# Patient Record
Sex: Female | Born: 1935 | Race: Black or African American | Hispanic: No | Smoking: Former smoker
Health system: Southern US, Community
[De-identification: ages and names within clinical notes are randomized; demographics above are authoritative.]

## PROBLEM LIST (undated history)

## (undated) DIAGNOSIS — F32A Depression, unspecified: Secondary | ICD-10-CM

## (undated) DIAGNOSIS — R040 Epistaxis: Secondary | ICD-10-CM

## (undated) DIAGNOSIS — G4730 Sleep apnea, unspecified: Secondary | ICD-10-CM

## (undated) DIAGNOSIS — I509 Heart failure, unspecified: Secondary | ICD-10-CM

## (undated) DIAGNOSIS — F419 Anxiety disorder, unspecified: Secondary | ICD-10-CM

## (undated) DIAGNOSIS — N63 Unspecified lump in unspecified breast: Secondary | ICD-10-CM

## (undated) DIAGNOSIS — G4733 Obstructive sleep apnea (adult) (pediatric): Secondary | ICD-10-CM

## (undated) DIAGNOSIS — I10 Essential (primary) hypertension: Secondary | ICD-10-CM

## (undated) DIAGNOSIS — E669 Obesity, unspecified: Secondary | ICD-10-CM

## (undated) DIAGNOSIS — M1710 Unilateral primary osteoarthritis, unspecified knee: Secondary | ICD-10-CM

## (undated) DIAGNOSIS — F329 Major depressive disorder, single episode, unspecified: Secondary | ICD-10-CM

## (undated) DIAGNOSIS — H269 Unspecified cataract: Secondary | ICD-10-CM

## (undated) DIAGNOSIS — R7309 Other abnormal glucose: Secondary | ICD-10-CM

## (undated) DIAGNOSIS — Z87891 Personal history of nicotine dependence: Secondary | ICD-10-CM

## (undated) DIAGNOSIS — E785 Hyperlipidemia, unspecified: Secondary | ICD-10-CM

## (undated) DIAGNOSIS — J329 Chronic sinusitis, unspecified: Secondary | ICD-10-CM

## (undated) DIAGNOSIS — I639 Cerebral infarction, unspecified: Secondary | ICD-10-CM

## (undated) DIAGNOSIS — M4800 Spinal stenosis, site unspecified: Secondary | ICD-10-CM

## (undated) DIAGNOSIS — N6019 Diffuse cystic mastopathy of unspecified breast: Secondary | ICD-10-CM

## (undated) DIAGNOSIS — I517 Cardiomegaly: Secondary | ICD-10-CM

## (undated) DIAGNOSIS — D249 Benign neoplasm of unspecified breast: Secondary | ICD-10-CM

## (undated) DIAGNOSIS — K449 Diaphragmatic hernia without obstruction or gangrene: Secondary | ICD-10-CM

## (undated) DIAGNOSIS — K222 Esophageal obstruction: Secondary | ICD-10-CM

## (undated) DIAGNOSIS — K5730 Diverticulosis of large intestine without perforation or abscess without bleeding: Secondary | ICD-10-CM

## (undated) DIAGNOSIS — R609 Edema, unspecified: Secondary | ICD-10-CM

## (undated) DIAGNOSIS — M179 Osteoarthritis of knee, unspecified: Secondary | ICD-10-CM

## (undated) DIAGNOSIS — F5089 Other specified eating disorder: Secondary | ICD-10-CM

## (undated) DIAGNOSIS — M5430 Sciatica, unspecified side: Secondary | ICD-10-CM

## (undated) DIAGNOSIS — K922 Gastrointestinal hemorrhage, unspecified: Secondary | ICD-10-CM

## (undated) DIAGNOSIS — I499 Cardiac arrhythmia, unspecified: Secondary | ICD-10-CM

## (undated) DIAGNOSIS — I5030 Unspecified diastolic (congestive) heart failure: Secondary | ICD-10-CM

## (undated) DIAGNOSIS — F509 Eating disorder, unspecified: Secondary | ICD-10-CM

## (undated) DIAGNOSIS — N390 Urinary tract infection, site not specified: Secondary | ICD-10-CM

## (undated) HISTORY — DX: Unilateral primary osteoarthritis, unspecified knee: M17.10

## (undated) HISTORY — DX: Sleep apnea, unspecified: G47.30

## (undated) HISTORY — PX: BREAST BIOPSY: SHX20

## (undated) HISTORY — DX: Chronic sinusitis, unspecified: J32.9

## (undated) HISTORY — DX: Unspecified cataract: H26.9

## (undated) HISTORY — DX: Depression, unspecified: F32.A

## (undated) HISTORY — DX: Urinary tract infection, site not specified: N39.0

## (undated) HISTORY — PX: US TRANSVAGINAL PELVIC MODIFIED: HXRAD721

## (undated) HISTORY — DX: Major depressive disorder, single episode, unspecified: F32.9

## (undated) HISTORY — DX: Esophageal obstruction: K22.2

## (undated) HISTORY — DX: Cardiac arrhythmia, unspecified: I49.9

## (undated) HISTORY — DX: Osteoarthritis of knee, unspecified: M17.9

## (undated) HISTORY — DX: Unspecified lump in unspecified breast: N63.0

## (undated) HISTORY — PX: JOINT REPLACEMENT: SHX530

## (undated) HISTORY — DX: Cardiomegaly: I51.7

## (undated) HISTORY — DX: Hyperlipidemia, unspecified: E78.5

## (undated) HISTORY — PX: APPENDECTOMY: SHX54

## (undated) HISTORY — PX: TOTAL ABDOMINAL HYSTERECTOMY: SHX209

## (undated) HISTORY — DX: Spinal stenosis, site unspecified: M48.00

## (undated) HISTORY — DX: Other specified eating disorder: F50.89

## (undated) HISTORY — DX: Other abnormal glucose: R73.09

## (undated) HISTORY — DX: Diverticulosis of large intestine without perforation or abscess without bleeding: K57.30

## (undated) HISTORY — DX: Obstructive sleep apnea (adult) (pediatric): G47.33

## (undated) HISTORY — DX: Epistaxis: R04.0

## (undated) HISTORY — DX: Edema, unspecified: R60.9

## (undated) HISTORY — DX: Diffuse cystic mastopathy of unspecified breast: N60.19

## (undated) HISTORY — DX: Anxiety disorder, unspecified: F41.9

## (undated) HISTORY — DX: Personal history of nicotine dependence: Z87.891

## (undated) HISTORY — PX: BREAST LUMPECTOMY: SHX2

## (undated) HISTORY — DX: Sciatica, unspecified side: M54.30

## (undated) HISTORY — DX: Essential (primary) hypertension: I10

## (undated) HISTORY — DX: Eating disorder, unspecified: F50.9

## (undated) HISTORY — DX: Obesity, unspecified: E66.9

## (undated) HISTORY — DX: Unspecified diastolic (congestive) heart failure: I50.30

## (undated) HISTORY — DX: Diaphragmatic hernia without obstruction or gangrene: K44.9

## (undated) HISTORY — DX: Benign neoplasm of unspecified breast: D24.9

## (undated) HISTORY — PX: TONSILLECTOMY: SUR1361

---

## 1998-09-09 ENCOUNTER — Ambulatory Visit (HOSPITAL_COMMUNITY): Admission: RE | Admit: 1998-09-09 | Discharge: 1998-09-09 | Payer: Self-pay | Admitting: Cardiology

## 1998-09-09 ENCOUNTER — Encounter: Payer: Self-pay | Admitting: Cardiology

## 1998-09-24 ENCOUNTER — Ambulatory Visit (HOSPITAL_COMMUNITY): Admission: RE | Admit: 1998-09-24 | Discharge: 1998-09-24 | Payer: Self-pay | Admitting: Cardiology

## 1998-11-11 ENCOUNTER — Other Ambulatory Visit: Admission: RE | Admit: 1998-11-11 | Discharge: 1998-11-11 | Payer: Self-pay | Admitting: Obstetrics

## 1999-02-22 HISTORY — PX: CARDIAC CATHETERIZATION: SHX172

## 1999-06-18 ENCOUNTER — Encounter: Payer: Self-pay | Admitting: Gynecology

## 1999-06-18 ENCOUNTER — Encounter: Admission: RE | Admit: 1999-06-18 | Discharge: 1999-06-18 | Payer: Self-pay | Admitting: Gynecology

## 1999-08-18 ENCOUNTER — Encounter: Payer: Self-pay | Admitting: Family Medicine

## 1999-08-18 ENCOUNTER — Encounter: Admission: RE | Admit: 1999-08-18 | Discharge: 1999-08-18 | Payer: Self-pay | Admitting: Family Medicine

## 2000-03-16 ENCOUNTER — Ambulatory Visit (HOSPITAL_COMMUNITY): Admission: RE | Admit: 2000-03-16 | Discharge: 2000-03-16 | Payer: Self-pay | Admitting: Gastroenterology

## 2000-04-21 HISTORY — PX: OTHER SURGICAL HISTORY: SHX169

## 2000-12-22 HISTORY — PX: COLONOSCOPY: SHX174

## 2002-05-21 ENCOUNTER — Encounter: Payer: Self-pay | Admitting: *Deleted

## 2002-05-21 ENCOUNTER — Ambulatory Visit (HOSPITAL_COMMUNITY): Admission: RE | Admit: 2002-05-21 | Discharge: 2002-05-21 | Payer: Self-pay | Admitting: *Deleted

## 2002-05-23 ENCOUNTER — Encounter: Payer: Self-pay | Admitting: Family Medicine

## 2002-05-23 LAB — CONVERTED CEMR LAB

## 2002-07-18 ENCOUNTER — Encounter: Payer: Self-pay | Admitting: Family Medicine

## 2002-07-18 ENCOUNTER — Encounter: Admission: RE | Admit: 2002-07-18 | Discharge: 2002-07-18 | Payer: Self-pay | Admitting: Family Medicine

## 2003-04-23 ENCOUNTER — Encounter: Admission: RE | Admit: 2003-04-23 | Discharge: 2003-04-23 | Payer: Self-pay | Admitting: Family Medicine

## 2003-06-22 HISTORY — PX: OTHER SURGICAL HISTORY: SHX169

## 2003-06-23 ENCOUNTER — Ambulatory Visit (HOSPITAL_BASED_OUTPATIENT_CLINIC_OR_DEPARTMENT_OTHER): Admission: RE | Admit: 2003-06-23 | Discharge: 2003-06-23 | Payer: Self-pay | Admitting: Family Medicine

## 2003-06-30 LAB — FECAL OCCULT BLOOD, GUAIAC: Fecal Occult Blood: NEGATIVE

## 2004-02-26 ENCOUNTER — Ambulatory Visit: Payer: Self-pay | Admitting: Family Medicine

## 2004-03-29 ENCOUNTER — Ambulatory Visit: Payer: Self-pay | Admitting: Family Medicine

## 2004-04-26 ENCOUNTER — Ambulatory Visit: Payer: Self-pay | Admitting: Family Medicine

## 2004-06-23 ENCOUNTER — Ambulatory Visit: Payer: Self-pay | Admitting: Family Medicine

## 2004-07-26 ENCOUNTER — Ambulatory Visit: Payer: Self-pay | Admitting: Family Medicine

## 2004-07-30 ENCOUNTER — Ambulatory Visit: Payer: Self-pay

## 2004-09-23 ENCOUNTER — Ambulatory Visit: Payer: Self-pay | Admitting: Family Medicine

## 2004-10-19 ENCOUNTER — Ambulatory Visit: Payer: Self-pay | Admitting: Family Medicine

## 2004-10-22 ENCOUNTER — Ambulatory Visit (HOSPITAL_COMMUNITY): Admission: RE | Admit: 2004-10-22 | Discharge: 2004-10-22 | Payer: Self-pay | Admitting: Gastroenterology

## 2004-10-22 HISTORY — PX: COLONOSCOPY: SHX174

## 2004-12-23 ENCOUNTER — Ambulatory Visit: Payer: Self-pay | Admitting: Family Medicine

## 2005-02-09 ENCOUNTER — Ambulatory Visit: Payer: Self-pay | Admitting: Family Medicine

## 2005-03-11 ENCOUNTER — Ambulatory Visit: Payer: Self-pay | Admitting: Family Medicine

## 2005-04-27 ENCOUNTER — Ambulatory Visit: Payer: Self-pay | Admitting: Family Medicine

## 2005-06-09 ENCOUNTER — Ambulatory Visit: Payer: Self-pay | Admitting: Family Medicine

## 2005-12-23 ENCOUNTER — Ambulatory Visit: Payer: Self-pay | Admitting: Family Medicine

## 2005-12-30 ENCOUNTER — Ambulatory Visit: Payer: Self-pay | Admitting: Family Medicine

## 2006-01-30 ENCOUNTER — Ambulatory Visit: Payer: Self-pay | Admitting: Family Medicine

## 2006-02-09 ENCOUNTER — Ambulatory Visit: Payer: Self-pay | Admitting: Family Medicine

## 2006-02-20 ENCOUNTER — Ambulatory Visit: Payer: Self-pay | Admitting: Family Medicine

## 2006-02-21 HISTORY — PX: OTHER SURGICAL HISTORY: SHX169

## 2006-02-27 ENCOUNTER — Ambulatory Visit: Payer: Self-pay | Admitting: Cardiology

## 2006-03-02 ENCOUNTER — Encounter: Payer: Self-pay | Admitting: Cardiology

## 2006-03-02 ENCOUNTER — Ambulatory Visit: Payer: Self-pay

## 2006-03-07 ENCOUNTER — Ambulatory Visit: Payer: Self-pay | Admitting: Family Medicine

## 2006-03-08 ENCOUNTER — Ambulatory Visit: Payer: Self-pay

## 2006-03-22 ENCOUNTER — Ambulatory Visit: Payer: Self-pay | Admitting: Cardiology

## 2006-05-08 ENCOUNTER — Ambulatory Visit: Payer: Self-pay | Admitting: Family Medicine

## 2006-05-15 ENCOUNTER — Ambulatory Visit: Payer: Self-pay | Admitting: Family Medicine

## 2006-05-19 ENCOUNTER — Ambulatory Visit: Payer: Self-pay | Admitting: Licensed Clinical Social Worker

## 2006-05-22 ENCOUNTER — Ambulatory Visit: Payer: Self-pay | Admitting: Family Medicine

## 2006-05-22 LAB — CONVERTED CEMR LAB
ALT: 42 units/L — ABNORMAL HIGH (ref 0–40)
AST: 38 units/L — ABNORMAL HIGH (ref 0–37)
Albumin: 4 g/dL (ref 3.5–5.2)
BUN: 14 mg/dL (ref 6–23)
Basophils Absolute: 0 10*3/uL (ref 0.0–0.1)
Basophils Relative: 0.7 % (ref 0.0–1.0)
CO2: 34 meq/L — ABNORMAL HIGH (ref 19–32)
Calcium: 9.8 mg/dL (ref 8.4–10.5)
Chloride: 105 meq/L (ref 96–112)
Cholesterol: 194 mg/dL (ref 0–200)
Creatinine, Ser: 1.1 mg/dL (ref 0.4–1.2)
Eosinophils Absolute: 0.2 10*3/uL (ref 0.0–0.6)
Eosinophils Relative: 3.2 % (ref 0.0–5.0)
GFR calc Af Amer: 64 mL/min
GFR calc non Af Amer: 52 mL/min
Glucose, Bld: 98 mg/dL (ref 70–99)
HCT: 41.9 % (ref 36.0–46.0)
HDL: 41.4 mg/dL (ref >39.0)
Hemoglobin: 13.9 g/dL (ref 12.0–15.0)
LDL Cholesterol: 126 mg/dL — ABNORMAL HIGH (ref 0–99)
Lymphocytes Relative: 33 % (ref 12.0–46.0)
MCHC: 33.2 g/dL (ref 30.0–36.0)
MCV: 87.1 fL (ref 78.0–100.0)
Monocytes Absolute: 0.6 10*3/uL (ref 0.2–0.7)
Monocytes Relative: 8.8 % (ref 3.0–11.0)
Neutro Abs: 4 10*3/uL (ref 1.4–7.7)
Neutrophils Relative %: 54.3 % (ref 43.0–77.0)
Phosphorus: 3.6 mg/dL (ref 2.3–4.6)
Platelets: 276 10*3/uL (ref 150–400)
Potassium: 4.4 meq/L (ref 3.5–5.1)
RBC: 4.81 M/uL (ref 3.87–5.11)
RDW: 14.1 % (ref 11.5–14.6)
Sodium: 146 meq/L — ABNORMAL HIGH (ref 135–145)
TSH: 1.15 microintl units/mL (ref 0.35–5.50)
Total CHOL/HDL Ratio: 4.7
Triglycerides: 134 mg/dL (ref 0–149)
VLDL: 27 mg/dL (ref 0–40)
WBC: 7.1 10*3/uL (ref 4.5–10.5)

## 2006-06-02 ENCOUNTER — Ambulatory Visit: Payer: Self-pay | Admitting: Licensed Clinical Social Worker

## 2006-06-16 ENCOUNTER — Ambulatory Visit: Payer: Self-pay | Admitting: Licensed Clinical Social Worker

## 2006-08-24 ENCOUNTER — Ambulatory Visit: Payer: Self-pay | Admitting: Family Medicine

## 2006-08-24 ENCOUNTER — Encounter (INDEPENDENT_AMBULATORY_CARE_PROVIDER_SITE_OTHER): Payer: Self-pay | Admitting: Internal Medicine

## 2006-09-25 LAB — HM MAMMOGRAPHY: HM Mammogram: NORMAL

## 2006-11-15 ENCOUNTER — Encounter (INDEPENDENT_AMBULATORY_CARE_PROVIDER_SITE_OTHER): Payer: Self-pay | Admitting: *Deleted

## 2006-12-21 ENCOUNTER — Ambulatory Visit: Payer: Self-pay | Admitting: Family Medicine

## 2006-12-21 DIAGNOSIS — F418 Other specified anxiety disorders: Secondary | ICD-10-CM

## 2006-12-21 DIAGNOSIS — E785 Hyperlipidemia, unspecified: Secondary | ICD-10-CM | POA: Insufficient documentation

## 2006-12-21 DIAGNOSIS — G4733 Obstructive sleep apnea (adult) (pediatric): Secondary | ICD-10-CM | POA: Insufficient documentation

## 2006-12-21 DIAGNOSIS — I10 Essential (primary) hypertension: Secondary | ICD-10-CM

## 2006-12-21 DIAGNOSIS — I517 Cardiomegaly: Secondary | ICD-10-CM | POA: Insufficient documentation

## 2006-12-21 HISTORY — DX: Other specified anxiety disorders: F41.8

## 2006-12-21 HISTORY — DX: Essential (primary) hypertension: I10

## 2006-12-28 ENCOUNTER — Encounter: Payer: Self-pay | Admitting: Family Medicine

## 2006-12-28 ENCOUNTER — Ambulatory Visit: Payer: Self-pay | Admitting: Family Medicine

## 2007-01-01 ENCOUNTER — Encounter: Payer: Self-pay | Admitting: Family Medicine

## 2007-01-16 ENCOUNTER — Encounter: Payer: Self-pay | Admitting: Family Medicine

## 2007-02-23 ENCOUNTER — Ambulatory Visit: Payer: Self-pay | Admitting: Family Medicine

## 2007-03-16 ENCOUNTER — Encounter: Payer: Self-pay | Admitting: Family Medicine

## 2007-07-05 ENCOUNTER — Encounter: Payer: Self-pay | Admitting: Family Medicine

## 2007-08-10 ENCOUNTER — Telehealth: Payer: Self-pay | Admitting: Family Medicine

## 2007-09-06 ENCOUNTER — Ambulatory Visit: Payer: Self-pay | Admitting: Family Medicine

## 2007-09-10 ENCOUNTER — Telehealth (INDEPENDENT_AMBULATORY_CARE_PROVIDER_SITE_OTHER): Payer: Self-pay | Admitting: *Deleted

## 2007-09-25 ENCOUNTER — Ambulatory Visit: Payer: Self-pay | Admitting: General Surgery

## 2007-10-05 ENCOUNTER — Ambulatory Visit: Payer: Self-pay | Admitting: Family Medicine

## 2007-10-09 LAB — CONVERTED CEMR LAB
ALT: 35 units/L (ref 0–35)
AST: 26 units/L (ref 0–37)
Albumin: 3.7 g/dL (ref 3.5–5.2)
Alkaline Phosphatase: 87 units/L (ref 39–117)
BUN: 19 mg/dL (ref 6–23)
Basophils Absolute: 0 10*3/uL (ref 0.0–0.1)
Basophils Relative: 0.4 % (ref 0.0–3.0)
Bilirubin, Direct: 0.1 mg/dL (ref 0.0–0.3)
CO2: 32 meq/L (ref 19–32)
Calcium: 9.3 mg/dL (ref 8.4–10.5)
Chloride: 107 meq/L (ref 96–112)
Cholesterol: 202 mg/dL (ref 0–200)
Creatinine, Ser: 1.1 mg/dL (ref 0.4–1.2)
Direct LDL: 146.1 mg/dL
Eosinophils Absolute: 0.1 10*3/uL (ref 0.0–0.7)
Eosinophils Relative: 1.9 % (ref 0.0–5.0)
GFR calc Af Amer: 63 mL/min
GFR calc non Af Amer: 52 mL/min
Glucose, Bld: 105 mg/dL — ABNORMAL HIGH (ref 70–99)
HCT: 40.9 % (ref 36.0–46.0)
HDL: 43.6 mg/dL (ref >39.0)
Hemoglobin: 14 g/dL (ref 12.0–15.0)
Lymphocytes Relative: 32.5 % (ref 12.0–46.0)
MCHC: 34.3 g/dL (ref 30.0–36.0)
MCV: 89.4 fL (ref 78.0–100.0)
Monocytes Absolute: 0.6 10*3/uL (ref 0.1–1.0)
Monocytes Relative: 8.8 % (ref 3.0–12.0)
Neutro Abs: 4.1 10*3/uL (ref 1.4–7.7)
Neutrophils Relative %: 56.4 % (ref 43.0–77.0)
Platelets: 252 10*3/uL (ref 150–400)
Potassium: 4 meq/L (ref 3.5–5.1)
RBC: 4.58 M/uL (ref 3.87–5.11)
RDW: 14.4 % (ref 11.5–14.6)
Sodium: 145 meq/L (ref 135–145)
TSH: 1.04 microintl units/mL (ref 0.35–5.50)
Total Bilirubin: 0.7 mg/dL (ref 0.3–1.2)
Total CHOL/HDL Ratio: 4.6
Total Protein: 6.4 g/dL (ref 6.0–8.3)
Triglycerides: 94 mg/dL (ref 0–149)
VLDL: 19 mg/dL (ref 0–40)
WBC: 7.1 10*3/uL (ref 4.5–10.5)

## 2007-10-10 ENCOUNTER — Ambulatory Visit: Payer: Self-pay | Admitting: Family Medicine

## 2007-10-22 ENCOUNTER — Ambulatory Visit: Payer: Self-pay | Admitting: Family Medicine

## 2007-12-13 ENCOUNTER — Ambulatory Visit: Payer: Self-pay | Admitting: Family Medicine

## 2007-12-14 LAB — CONVERTED CEMR LAB
ALT: 28 units/L (ref 0–35)
AST: 26 units/L (ref 0–37)
Cholesterol: 194 mg/dL (ref 0–200)
HDL: 46.1 mg/dL (ref >39.0)
Hgb A1c MFr Bld: 6.2 % — ABNORMAL HIGH (ref 4.6–6.0)
LDL Cholesterol: 134 mg/dL — ABNORMAL HIGH (ref 0–99)
Total CHOL/HDL Ratio: 4.2
Triglycerides: 70 mg/dL (ref 0–149)
VLDL: 14 mg/dL (ref 0–40)

## 2008-02-22 DIAGNOSIS — J329 Chronic sinusitis, unspecified: Secondary | ICD-10-CM

## 2008-02-22 HISTORY — DX: Chronic sinusitis, unspecified: J32.9

## 2008-02-27 ENCOUNTER — Ambulatory Visit: Payer: Self-pay | Admitting: Family Medicine

## 2008-03-10 ENCOUNTER — Telehealth: Payer: Self-pay | Admitting: Family Medicine

## 2008-03-18 ENCOUNTER — Ambulatory Visit: Payer: Self-pay | Admitting: Family Medicine

## 2008-03-18 ENCOUNTER — Encounter: Payer: Self-pay | Admitting: Family Medicine

## 2008-03-20 ENCOUNTER — Ambulatory Visit: Payer: Self-pay | Admitting: Family Medicine

## 2008-03-20 DIAGNOSIS — M109 Gout, unspecified: Secondary | ICD-10-CM | POA: Insufficient documentation

## 2008-03-24 ENCOUNTER — Ambulatory Visit: Payer: Self-pay | Admitting: Family Medicine

## 2008-04-21 ENCOUNTER — Ambulatory Visit: Payer: Self-pay | Admitting: Family Medicine

## 2008-05-08 ENCOUNTER — Encounter: Payer: Self-pay | Admitting: Family Medicine

## 2008-05-13 ENCOUNTER — Encounter: Payer: Self-pay | Admitting: Family Medicine

## 2008-05-22 ENCOUNTER — Ambulatory Visit: Payer: Self-pay | Admitting: Family Medicine

## 2008-06-02 ENCOUNTER — Ambulatory Visit: Payer: Self-pay | Admitting: Family Medicine

## 2008-06-04 LAB — CONVERTED CEMR LAB
ALT: 26 units/L (ref 0–35)
AST: 25 units/L (ref 0–37)
Albumin: 3.7 g/dL (ref 3.5–5.2)
BUN: 20 mg/dL (ref 6–23)
CO2: 34 meq/L — ABNORMAL HIGH (ref 19–32)
Calcium: 9.6 mg/dL (ref 8.4–10.5)
Chloride: 106 meq/L (ref 96–112)
Cholesterol: 199 mg/dL (ref 0–200)
Creatinine, Ser: 1.1 mg/dL (ref 0.4–1.2)
Glucose, Bld: 89 mg/dL (ref 70–99)
HDL: 39.5 mg/dL (ref >39.00)
Hgb A1c MFr Bld: 6.1 % (ref 4.6–6.5)
LDL Cholesterol: 136 mg/dL — ABNORMAL HIGH (ref 0–99)
Phosphorus: 3.2 mg/dL (ref 2.3–4.6)
Potassium: 3.6 meq/L (ref 3.5–5.1)
Sodium: 148 meq/L — ABNORMAL HIGH (ref 135–145)
Total CHOL/HDL Ratio: 5
Triglycerides: 117 mg/dL (ref 0.0–149.0)
VLDL: 23.4 mg/dL (ref 0.0–40.0)

## 2008-09-03 ENCOUNTER — Ambulatory Visit: Payer: Self-pay | Admitting: Family Medicine

## 2008-09-04 LAB — CONVERTED CEMR LAB
Albumin: 3.7 g/dL (ref 3.5–5.2)
BUN: 18 mg/dL (ref 6–23)
CO2: 29 meq/L (ref 19–32)
Calcium: 9.4 mg/dL (ref 8.4–10.5)
Chloride: 105 meq/L (ref 96–112)
Creatinine, Ser: 0.9 mg/dL (ref 0.4–1.2)
Glucose, Bld: 83 mg/dL (ref 70–99)
Hgb A1c MFr Bld: 6.1 % (ref 4.6–6.5)
Phosphorus: 3.9 mg/dL (ref 2.3–4.6)
Potassium: 3.9 meq/L (ref 3.5–5.1)
Sodium: 143 meq/L (ref 135–145)

## 2008-09-05 ENCOUNTER — Encounter (INDEPENDENT_AMBULATORY_CARE_PROVIDER_SITE_OTHER): Payer: Self-pay | Admitting: *Deleted

## 2008-09-22 ENCOUNTER — Ambulatory Visit: Payer: Self-pay | Admitting: Family Medicine

## 2008-10-02 ENCOUNTER — Ambulatory Visit: Payer: Self-pay | Admitting: Family Medicine

## 2008-10-02 LAB — CONVERTED CEMR LAB
Basophils Absolute: 0 10*3/uL (ref 0.0–0.1)
Basophils Relative: 0.4 % (ref 0.0–3.0)
Eosinophils Absolute: 0.1 10*3/uL (ref 0.0–0.7)
Eosinophils Relative: 1.7 % (ref 0.0–5.0)
HCT: 38.2 % (ref 36.0–46.0)
Hemoglobin: 13.1 g/dL (ref 12.0–15.0)
Lymphocytes Relative: 27.9 % (ref 12.0–46.0)
Lymphs Abs: 1.9 10*3/uL (ref 0.7–4.0)
MCHC: 34.4 g/dL (ref 30.0–36.0)
MCV: 87.4 fL (ref 78.0–100.0)
Monocytes Absolute: 0.5 10*3/uL (ref 0.1–1.0)
Monocytes Relative: 7.3 % (ref 3.0–12.0)
Neutro Abs: 4.4 10*3/uL (ref 1.4–7.7)
Neutrophils Relative %: 62.7 % (ref 43.0–77.0)
Platelets: 205 10*3/uL (ref 150.0–400.0)
RBC: 4.37 M/uL (ref 3.87–5.11)
RDW: 14.2 % (ref 11.5–14.6)
Sed Rate: 26 mm/hr — ABNORMAL HIGH (ref 0–22)
WBC: 6.9 10*3/uL (ref 4.5–10.5)

## 2008-10-03 ENCOUNTER — Encounter: Payer: Self-pay | Admitting: Family Medicine

## 2008-10-07 ENCOUNTER — Ambulatory Visit: Payer: Self-pay | Admitting: Cardiology

## 2008-10-13 ENCOUNTER — Ambulatory Visit: Payer: Self-pay | Admitting: General Surgery

## 2008-10-16 ENCOUNTER — Encounter: Payer: Self-pay | Admitting: Family Medicine

## 2008-10-29 ENCOUNTER — Encounter (INDEPENDENT_AMBULATORY_CARE_PROVIDER_SITE_OTHER): Payer: Self-pay | Admitting: *Deleted

## 2008-11-21 LAB — HM DIABETES EYE EXAM: HM Diabetic Eye Exam: NORMAL

## 2008-12-10 ENCOUNTER — Ambulatory Visit: Payer: Self-pay | Admitting: Family Medicine

## 2008-12-12 LAB — CONVERTED CEMR LAB
ALT: 29 units/L (ref 0–35)
AST: 23 units/L (ref 0–37)
Cholesterol: 202 mg/dL — ABNORMAL HIGH (ref 0–200)
Direct LDL: 137.4 mg/dL
HDL: 47.4 mg/dL (ref >39.00)
Hgb A1c MFr Bld: 6.2 % (ref 4.6–6.5)
Total CHOL/HDL Ratio: 4
Triglycerides: 81 mg/dL (ref 0.0–149.0)
VLDL: 16.2 mg/dL (ref 0.0–40.0)

## 2008-12-17 ENCOUNTER — Ambulatory Visit: Payer: Self-pay | Admitting: Family Medicine

## 2008-12-22 LAB — CONVERTED CEMR LAB: Sed Rate: 14 mm/hr (ref 0–22)

## 2009-01-02 ENCOUNTER — Telehealth: Payer: Self-pay | Admitting: Family Medicine

## 2009-01-14 ENCOUNTER — Encounter: Admission: RE | Admit: 2009-01-14 | Discharge: 2009-01-14 | Payer: Self-pay | Admitting: Neurology

## 2009-03-05 ENCOUNTER — Ambulatory Visit: Payer: Self-pay | Admitting: Family Medicine

## 2009-03-06 LAB — CONVERTED CEMR LAB
Albumin: 3.5 g/dL (ref 3.5–5.2)
BUN: 18 mg/dL (ref 6–23)
CO2: 31 meq/L (ref 19–32)
Calcium: 9 mg/dL (ref 8.4–10.5)
Chloride: 100 meq/L (ref 96–112)
Creatinine, Ser: 1 mg/dL (ref 0.4–1.2)
GFR calc non Af Amer: 70.19 mL/min (ref >60)
Glucose, Bld: 94 mg/dL (ref 70–99)
Phosphorus: 3.2 mg/dL (ref 2.3–4.6)
Potassium: 4.1 meq/L (ref 3.5–5.1)
Pro B Natriuretic peptide (BNP): 115 pg/mL — ABNORMAL HIGH (ref 0.0–100.0)
Sodium: 138 meq/L (ref 135–145)

## 2009-03-10 ENCOUNTER — Ambulatory Visit: Payer: Self-pay | Admitting: Family Medicine

## 2009-04-23 ENCOUNTER — Ambulatory Visit: Payer: Self-pay | Admitting: Family Medicine

## 2009-06-16 ENCOUNTER — Ambulatory Visit: Payer: Self-pay | Admitting: Family Medicine

## 2009-06-18 LAB — CONVERTED CEMR LAB
ALT: 22 units/L (ref 0–35)
AST: 24 units/L (ref 0–37)
Albumin: 3.8 g/dL (ref 3.5–5.2)
BUN: 19 mg/dL (ref 6–23)
CO2: 37 meq/L — ABNORMAL HIGH (ref 19–32)
Calcium: 10.2 mg/dL (ref 8.4–10.5)
Chloride: 101 meq/L (ref 96–112)
Cholesterol: 211 mg/dL — ABNORMAL HIGH (ref 0–200)
Creatinine, Ser: 1 mg/dL (ref 0.4–1.2)
Direct LDL: 135.4 mg/dL
GFR calc non Af Amer: 70.13 mL/min (ref >60)
Glucose, Bld: 110 mg/dL — ABNORMAL HIGH (ref 70–99)
HDL: 42.7 mg/dL (ref >39.00)
Hgb A1c MFr Bld: 6 % (ref 4.6–6.5)
Phosphorus: 3.6 mg/dL (ref 2.3–4.6)
Potassium: 3.5 meq/L (ref 3.5–5.1)
Sodium: 147 meq/L — ABNORMAL HIGH (ref 135–145)
Total CHOL/HDL Ratio: 5
Triglycerides: 246 mg/dL — ABNORMAL HIGH (ref 0.0–149.0)
VLDL: 49.2 mg/dL — ABNORMAL HIGH (ref 0.0–40.0)

## 2009-06-23 ENCOUNTER — Ambulatory Visit: Payer: Self-pay | Admitting: Pulmonary Disease

## 2009-06-25 ENCOUNTER — Encounter: Admission: RE | Admit: 2009-06-25 | Discharge: 2009-06-25 | Payer: Self-pay | Admitting: Family Medicine

## 2009-06-25 ENCOUNTER — Ambulatory Visit: Payer: Self-pay | Admitting: Family Medicine

## 2009-07-22 ENCOUNTER — Ambulatory Visit (HOSPITAL_BASED_OUTPATIENT_CLINIC_OR_DEPARTMENT_OTHER): Admission: RE | Admit: 2009-07-22 | Discharge: 2009-07-22 | Payer: Self-pay | Admitting: Pulmonary Disease

## 2009-07-31 ENCOUNTER — Ambulatory Visit: Payer: Self-pay | Admitting: Pulmonary Disease

## 2009-08-26 ENCOUNTER — Ambulatory Visit: Payer: Self-pay | Admitting: Pulmonary Disease

## 2009-09-01 ENCOUNTER — Encounter: Payer: Self-pay | Admitting: Family Medicine

## 2009-09-01 ENCOUNTER — Telehealth: Payer: Self-pay | Admitting: Family Medicine

## 2009-10-12 ENCOUNTER — Telehealth: Payer: Self-pay | Admitting: Family Medicine

## 2009-10-22 HISTORY — PX: OTHER SURGICAL HISTORY: SHX169

## 2009-10-29 ENCOUNTER — Inpatient Hospital Stay (HOSPITAL_COMMUNITY): Admission: RE | Admit: 2009-10-29 | Discharge: 2009-11-02 | Payer: Self-pay | Admitting: Orthopedic Surgery

## 2009-11-03 ENCOUNTER — Encounter: Payer: Self-pay | Admitting: Family Medicine

## 2009-11-19 ENCOUNTER — Telehealth: Payer: Self-pay | Admitting: Family Medicine

## 2009-12-09 ENCOUNTER — Ambulatory Visit: Payer: Self-pay | Admitting: Family Medicine

## 2009-12-11 LAB — CONVERTED CEMR LAB
ALT: 21 units/L (ref 0–35)
AST: 22 units/L (ref 0–37)
Albumin: 4 g/dL (ref 3.5–5.2)
Alkaline Phosphatase: 86 units/L (ref 39–117)
BUN: 20 mg/dL (ref 6–23)
Bilirubin, Direct: 0.1 mg/dL (ref 0.0–0.3)
CO2: 35 meq/L — ABNORMAL HIGH (ref 19–32)
Calcium: 10.1 mg/dL (ref 8.4–10.5)
Chloride: 102 meq/L (ref 96–112)
Cholesterol: 200 mg/dL (ref 0–200)
Creatinine, Ser: 0.9 mg/dL (ref 0.4–1.2)
Direct LDL: 131.8 mg/dL
GFR calc non Af Amer: 81.17 mL/min (ref >60)
Glucose, Bld: 88 mg/dL (ref 70–99)
HDL: 39.2 mg/dL (ref >39.00)
Hgb A1c MFr Bld: 5.7 % (ref 4.6–6.5)
Phosphorus: 3.8 mg/dL (ref 2.3–4.6)
Potassium: 3.5 meq/L (ref 3.5–5.1)
Sodium: 144 meq/L (ref 135–145)
Total Bilirubin: 0.6 mg/dL (ref 0.3–1.2)
Total CHOL/HDL Ratio: 5
Total Protein: 6.8 g/dL (ref 6.0–8.3)
Triglycerides: 202 mg/dL — ABNORMAL HIGH (ref 0.0–149.0)
Uric Acid, Serum: 10.9 mg/dL — ABNORMAL HIGH (ref 2.4–7.0)
VLDL: 40.4 mg/dL — ABNORMAL HIGH (ref 0.0–40.0)

## 2009-12-30 ENCOUNTER — Telehealth: Payer: Self-pay | Admitting: Family Medicine

## 2010-01-01 ENCOUNTER — Ambulatory Visit: Payer: Self-pay | Admitting: General Surgery

## 2010-02-02 ENCOUNTER — Encounter: Payer: Self-pay | Admitting: Family Medicine

## 2010-02-09 ENCOUNTER — Ambulatory Visit: Payer: Self-pay | Admitting: Family Medicine

## 2010-02-09 ENCOUNTER — Telehealth: Payer: Self-pay | Admitting: Family Medicine

## 2010-02-09 DIAGNOSIS — M1710 Unilateral primary osteoarthritis, unspecified knee: Secondary | ICD-10-CM | POA: Insufficient documentation

## 2010-02-09 DIAGNOSIS — M179 Osteoarthritis of knee, unspecified: Secondary | ICD-10-CM | POA: Insufficient documentation

## 2010-02-09 LAB — HM DIABETES FOOT EXAM

## 2010-02-12 ENCOUNTER — Ambulatory Visit: Payer: Self-pay | Admitting: Pulmonary Disease

## 2010-02-21 DIAGNOSIS — N63 Unspecified lump in unspecified breast: Secondary | ICD-10-CM

## 2010-02-21 HISTORY — DX: Unspecified lump in unspecified breast: N63.0

## 2010-02-21 HISTORY — PX: EYE SURGERY: SHX253

## 2010-02-21 HISTORY — PX: TOTAL KNEE ARTHROPLASTY: SHX125

## 2010-03-08 ENCOUNTER — Telehealth: Payer: Self-pay | Admitting: Family Medicine

## 2010-03-09 ENCOUNTER — Ambulatory Visit: Admit: 2010-03-09 | Payer: Self-pay | Admitting: Family Medicine

## 2010-03-12 ENCOUNTER — Ambulatory Visit
Admission: RE | Admit: 2010-03-12 | Discharge: 2010-03-12 | Payer: Self-pay | Source: Home / Self Care | Attending: Family Medicine | Admitting: Family Medicine

## 2010-03-12 ENCOUNTER — Other Ambulatory Visit: Payer: Self-pay | Admitting: Family Medicine

## 2010-03-12 LAB — HEPATIC FUNCTION PANEL
ALT: 18 U/L (ref 0–35)
AST: 22 U/L (ref 0–37)
Albumin: 4 g/dL (ref 3.5–5.2)
Alkaline Phosphatase: 96 U/L (ref 39–117)
Bilirubin, Direct: 0.2 mg/dL (ref 0.0–0.3)
Total Bilirubin: 0.7 mg/dL (ref 0.3–1.2)
Total Protein: 6.9 g/dL (ref 6.0–8.3)

## 2010-03-12 LAB — URIC ACID: Uric Acid, Serum: 5.4 mg/dL (ref 2.4–7.0)

## 2010-03-17 ENCOUNTER — Ambulatory Visit
Admission: RE | Admit: 2010-03-17 | Discharge: 2010-03-17 | Payer: Self-pay | Source: Home / Self Care | Attending: Family Medicine | Admitting: Family Medicine

## 2010-03-17 ENCOUNTER — Encounter: Payer: Self-pay | Admitting: Family Medicine

## 2010-03-25 NOTE — Assessment & Plan Note (Signed)
Summary: TINGLING IN ARM, SOME SWELLING/lb   Vital Signs:  Patient profile:   75 year old female Height:      66.25 inches Weight:      255 pounds BMI:     41.00 Temp:     98.8 degrees F oral Pulse rate:   76 / minute Pulse rhythm:   regular BP sitting:   138 / 76  (right arm) Cuff size:   large  Vitals Entered By: Lewanda Rife LPN (Jun 25, 1608 11:23 AM) CC: Tingling in left arm with swelling and dull pain in elbow area   History of Present Illness: L arm is tingling and sore -- starts in the shoulder and moves down  getting worse and worse hurts to the touch and swollen in anticubital area and just above it  is noticing a little swelling as well - just in the crook in elbow tingling for past few weeks  was just when going to sleep  now all the time all day worse if hanging over chair or resting on computer is R handed    no trauma  sleeps on the other side  was using some hand weights (light )- not too long ago  no tennis or bike riding   no meds no heat or ice   has never happened before   yesterday she took 2 advil   saw pulm about her sleep apnea  will be getting sleep study soon this needs to be controlled before her surgery/ gen aneth also needs control for wt loss      Allergies: 1)  ! Buspar 2)  ! * Vit C 3)  Lisinopril (Lisinopril)  Past History:  Past Medical History: Last updated: 02/27/2008 Allergic rhinitis Anxiety Depression Hyperlipidemia Hypertension obesity compulsive overeating  OA knees with injections  DM2  Past Surgical History: Last updated: 12/21/2006 Hysterectomy- partial, fibroids Lumpectomy, right x1 (1989),   left x 2 (1970's, 1990) Colonoscopy- diverticulosis (12/2000) Pelvic US (2000, 2001) Dexa- normal (04/2000) Cardiac cath- normal per pt (2001) Sleep study (06/2003) Colonoscopy- diverticulosis, hemorrhoids (10/2004) Adenosine cardiolite- low risk (02/2006)  Family History: Last updated:  06/12/2008 Father: died age 85- CVA Mother: HTN (has a lot of animosity in her relationship with her mother) Siblings: 2 brothers- 1, prostate cancer. 1, Hep C. 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 (approx 20 yrs ago)  Risk Factors: Alcohol Use: <1 (06/23/2009)  Risk Factors: Smoking Status: quit (06/23/2009) Packs/Day: 0.5 (06/23/2009)  Review of Systems General:  Denies fatigue, fever, loss of appetite, and malaise. Eyes:  Denies blurring and eye pain. CV:  Denies chest pain or discomfort and palpitations. Resp:  Denies cough, shortness of breath, and wheezing. GI:  Denies abdominal pain. MS:  Complains of joint pain, cramps, and stiffness; denies joint redness and joint swelling. Derm:  Denies itching, lesion(s), poor wound healing, and rash. Neuro:  Complains of tingling; denies numbness and weakness. Heme:  Denies abnormal bruising and bleeding.  Physical Exam  General:  obese and well appearing  Head:  normocephalic, atraumatic, and no abnormalities observed.   Neck:  nl rom  some L sided paracervical muscle tenderness no bony tenderness L trap is tender  Chest Wall:  No deformities, masses, or tenderness noted.  Lungs:  Normal respiratory effort, chest expands symmetrically. Lungs are clear to auscultation, no crackles or wheezes. Heart:  Normal rate and regular rhythm. S1 and S2 normal without gallop, murmur, click, rub or other extra sounds. Msk:  tender over acromion with nl rom and neg hawiking's test/neer  mild pos apprehension to ext arm  tender upper bicep tendon some mild soft tissue swelling vs fat depositon (no redness/ heat or crepitice) over distal elbow tender anticubital area and  olcrenon nlrom elbow but pain on pronation/ supination nl grip and rom wrist  neg tinel and phalen  Neurologic:  strength normal in all extremities, sensation intact to light touch, gait normal, and DTRs symmetrical and normal.   Skin:  Intact without suspicious lesions or rashes Cervical Nodes:  No lymphadenopathy noted Psych:  normal affect, talkative and pleasant    Impression & Recommendations:  Problem # 1:  ELBOW PAIN, LEFT (ICD-719.42) Assessment New  pain from elbow to shoulder with tenderness and some soft tissue swelling (vs fat deposition ) just inf to elbow tender over olcrenon and antecubital fossa and also to smaller extend bicep tendon insertion at upper arm  ? tendonitis or other recommend 1 aleve two times a day with food as tol and ice at least two times a day check shoulder and elbow films- then update with a plan  if worse adv to call  no neck symptoms whatsoever  Orders: Radiology Referral (Radiology)  Problem # 2:  SHOULDER PAIN, LEFT (ICD-719.41) Assessment: New see above   updated medication list for this problem includes:    Adult Aspirin Ec Low Strength 81 Mg Tbec (Aspirin) .Marland Kitchen... Take 1 tablet by mouth once a day  Orders: Radiology Referral (Radiology)  Problem # 3:  SLEEP APNEA (ICD-780.57) Assessment: Unchanged undergoing dx w/u now with sleep study  need to make sure this is treated and controllable before up coming gen aneth pulm notes reviewed  Complete Medication List: 1)  Toprol Xl 100 Mg Tb24 (Metoprolol succinate) .Marland Kitchen.. 1 by mouth daily 2)  Adult Aspirin Ec Low Strength 81 Mg Tbec (Aspirin) .... Take 1 tablet by mouth once a day 3)  Furosemide 80 Mg Tabs (Furosemide) .... Take 1 tablet by mouth once a day 4)  Klor-con M20 20 Meq Tbcr (Potassium chloride crys cr) .... Take 1 tablet by mouth two times a day 5)  Balanced Care Tabs (Multiple vitamins-minerals) .... Take 1 tablet by mouth once a day 6)  Calcium 600 Tabs (Calcium carbonate  tabs) .... Take 1 tablet by mouth two times a day 7)  Alber[employer]ons Natural Magnesium Tabs (Magnesium tabs) .... Take 1 tablet by mouth once a day 8)  Glucometer  .... To check glucose once daily and as needed for dm 250.0 9)  Accu Check Aviva Strips and Softclix Lancets  .... Use to check blood sugar once daily and as needed for dm 10)  Fish Oil 1000 Mg Caps (Omega-3 fatty acids) .Marland Kitchen.. 1 by mouth once daily 11)  Hydrochlorothiazide 12.5 Mg Caps (Hydrochlorothiazide) .Marland Kitchen.. 1 by mouth daily  Patient Instructions: 1)  use ice on area from shoulder to elbow - at least twice daily for 15 minutes 2)  use aleve with food 1 pill two times a day for fri - mon-- it will make your ankles swell / so watch your salt  3)  be cautious - do not to heavy lifting with your left arm 4)  we will set you up for x rays at check  out and then update you   Current Allergies (reviewed today): ! BUSPAR ! * VIT C LISINOPRIL (LISINOPRIL)

## 2010-03-25 NOTE — Progress Notes (Signed)
Summary: EKG from [facility] Note Call from Patient   Caller: Patient Call For: Roxy Manns MD Summary of Call: Pt saw Dr Milinda Antis this AM and asked EKG requested from [facility] place. The faxed EKG is on your shelf in the in box. Initial call taken by: Lewanda Rife LPN,  February 09, 2010 2:54 PM  Follow-up for Phone Call        thanks - I marked to scan and signed them  may not scan well - a bit fuzzy Follow-up by: Judith Part MD,  February 09, 2010 3:17 PM  Additional Follow-up for Phone Call Additional follow up Details #1::        Sent to be scanned.Lewanda Rife LPN  February 09, 2010 4:12 PM

## 2010-03-25 NOTE — Assessment & Plan Note (Signed)
Summary: F/U SINUS INFECTION/CLE   Vital Signs:  Patient profile:   75 year old female Height:      66.25 inches Weight:      256 pounds Temp:     97.9 degrees F oral Pulse rate:   72 / minute Pulse rhythm:   regular BP sitting:   140 / 90  (left arm) Cuff size:   large  Vitals Entered By: Liane Comber CMA (October 02, 2008 10:13 AM)  History of Present Illness: still having a lot of sinus pain and drainage yellow in color  headache- worse behind her R ear R nostril still clogged  that side of neck even hurts  no fever felt warm on and off  no n/v/d a little dizziness on and off  lots of drainage  no throat pain finished whole course of augmentin   has been to the dentist   Allergies: 1)  ! Buspar 2)  Lisinopril (Lisinopril)  Past History:  Past Medical History: Last updated: 02/27/2008 Allergic rhinitis Anxiety Depression Hyperlipidemia Hypertension obesity compulsive overeating  OA knees with injections  DM2  Past Surgical History: Last updated: 12/21/2006 Hysterectomy- partial, fibroids Lumpectomy, right x1 (1989),   left x 2 (1970's, 1990) Colonoscopy- diverticulosis (12/2000) Pelvic US (2000, 2001) Dexa- normal (04/2000) Cardiac cath- normal per pt (2001) Sleep study (06/2003) Colonoscopy- diverticulosis, hemorrhoids (10/2004) Adenosine cardiolite- low risk (02/2006)  Family History: Last updated: 2008-06-21 Father: died age 80- CVA Mother: HTN (has a lot of animosity in her relationship with her mother) Siblings: 2 brothers- 1, prostate cancer. 1, Hep C. 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 Eyes:  Denies blurring, discharge, double vision, eye irritation, itching, light sensitivity, and red eye. ENT:  Complains of earache, nasal congestion, postnasal drainage, and sinus pressure; denies decreased hearing, difficulty swallowing, ear discharge, and sore throat. CV:  Denies chest pain or discomfort, lightheadness, palpitations, shortness of breath with exertion, and swelling of feet. Resp:  Denies cough and wheezing. GI:  Denies abdominal pain, nausea, and vomiting. Derm:  Denies itching, lesion(s), and rash. Psych:  mood is stable. Endo:  Denies excessive thirst and excessive urination.  Physical Exam  General:  overweight but generally well appearing  Head:  tender over R maxillary sinus tender over R temporal artery area  no facial swelling or skin change Eyes:  vision grossly intact, pupils equal, pupils round, and pupils reactive to light.  no conjunctival pallor, injection or icterus  no nystagmus Ears:  R ear normal and L ear normal.   Nose:  nares are mildly congested-moreso R nare  Mouth:  pharynx pink and moist, no erythema, and no exudates.   Neck:  supple with full rom and no masses or thyromegally, no JVD or carotid bruit  Chest Wall:  No deformities, masses, or tenderness noted. Lungs:  Normal respiratory effort, chest expands symmetrically. Lungs are clear to auscultation, no crackles or wheezes. Heart:  Normal rate and regular rhythm. S1 and S2 normal without gallop, murmur, click, rub or other extra sounds. Skin:  Intact without suspicious lesions or rashes Cervical Nodes:  No lymphadenopathy noted Psych:  nl affect, pt seems fatigued    Impression & Recommendations:  Problem # 1:  SINUSITIS - ACUTE-NOS (ICD-461.9) Assessment Deteriorated not improved with augmentin continued facial pain /nasal drainage /headache  ? sinusitis vx other etiol like TA check cbc with diff and ESR today- and advise will  likely order ct of sinuses based on result disc sympt care- analgesics/ expectorant/ nasal saline  update if worse in meantime- pain or any fever  The following medications were removed from the medication list:    Augmentin 875-125 Mg Tabs (Amoxicillin-pot clavulanate) .Marland Kitchen... 1 by mouth two times a day for 10 days  Orders: Venipuncture (16109) TLB-CBC Platelet - w/Differential (85025-CBCD) TLB-Sedimentation Rate (ESR) (85652-ESR)  Problem # 2:  HEADACHE (ICD-784.0) Assessment: Comment Only see above - lab Her updated medication list for this problem includes:    Toprol Xl 100 Mg Tb24 (Metoprolol succinate) .Marland Kitchen... 1 by mouth daily    Adult Aspirin Ec Low Strength 81 Mg Tbec (Aspirin) .Marland Kitchen... Take 1 tablet by mouth once a day    Aleve 220 Mg Tabs (Naproxen sodium) .Marland Kitchen... 1-2 by mouth two times a day as needed gout -- do not use for more than 2-3 days  Orders: Venipuncture (60454) TLB-CBC Platelet - w/Differential (85025-CBCD) TLB-Sedimentation Rate (ESR) (85652-ESR)  Complete Medication List: 1)  Toprol Xl 100 Mg Tb24 (Metoprolol succinate) .Marland Kitchen.. 1 by mouth daily 2)  Adult Aspirin Ec Low Strength 81 Mg Tbec (Aspirin) .... Take 1 tablet by mouth once a day 3)  Furosemide 80 Mg Tabs (Furosemide) .... Take 1 tablet by mouth once a day 4)  Cardizem Cd 360 Mg Cp24 (Diltiazem hcl coated beads) .... Take 1 capsule by mouth once a day 5)  Klor-con M20 20 Meq Tbcr (Potassium chloride crys cr) .... Take 1 tablet by mouth two times a day 6)  Alprazolam 0.25 Mg Tabs (Alprazolam) .... Take 1 tablet by mouth once daily as needed severe anxiety 7)  Balanced Care Tabs (Multiple vitamins-minerals) .... Take 1 tablet by mouth once a day 8)  Calcium 600 Tabs (Calcium carbonate tabs) .... Take 1 tablet by mouth two times a day 9)  [employer]ons Natural Magnesium Tabs (Magnesium tabs) .... Take 1 tablet by mouth once a day 10)  Glucometer  .... To check glucose once daily and as needed for dm 250.0 11)  Accu  Check Aviva Strips and Softclix Lancets  .... Use to check blood sugar once daily and as needed for dm 12)  Aleve 220 Mg Tabs (Naproxen sodium) .Marland Kitchen.. 1-2 by mouth two times a day as needed gout -- do not use for more than 2-3 days  Patient Instructions: 1)  checking labs today for cbc and sed rate  2)  continue saline nasal spray  3)  try mucinex plain over the counter to loosen the congestion more  4)  when labs return - I will likely set you up for CT of sinuses-- will let you know  5)  if headache worsens or vision change or other new symptoms - update me  6)  you can try aleve with food for pain , and tylenol is ok too  7)  if aleve causes stomach upset  - update me   Prior Medications (reviewed today): TOPROL XL 100 MG  TB24 (METOPROLOL SUCCINATE) 1 by mouth daily ADULT ASPIRIN EC LOW STRENGTH 81 MG  TBEC (ASPIRIN) Take 1 tablet by mouth once a day FUROSEMIDE 80 MG  TABS (FUROSEMIDE) Take 1 tablet by mouth once  a day CARDIZEM CD 360 MG  CP24 (DILTIAZEM HCL COATED BEADS) Take 1 capsule by mouth once a day KLOR-CON M20 20 MEQ  TBCR (POTASSIUM CHLORIDE CRYS CR) Take 1 tablet by mouth two times a day ALPRAZOLAM 0.25 MG  TABS (ALPRAZOLAM) Take 1 tablet by mouth once daily as needed severe anxiety BALANCED CARE   TABS (MULTIPLE VITAMINS-MINERALS) Take 1 tablet by mouth once a day CALCIUM 600   TABS (CALCIUM CARBONATE TABS) Take 1 tablet by mouth two times a day ALBERTSONS NATURAL MAGNESIUM   TABS (MAGNESIUM TABS) Take 1 tablet by mouth once a day GLUCOMETER () to check glucose once daily and as needed for DM 250.0 ACCU CHECK AVIVA STRIPS AND SOFTCLIX LANCETS () use to check blood sugar once daily and as needed for DM ALEVE 220 MG TABS (NAPROXEN SODIUM) 1-2 by mouth two times a day as needed gout -- do not use for more than 2-3 days Current Allergies (reviewed today): ! BUSPAR LISINOPRIL (LISINOPRIL) Current Medications (including changes made in today's visit):  TOPROL XL 100 MG  TB24  (METOPROLOL SUCCINATE) 1 by mouth daily ADULT ASPIRIN EC LOW STRENGTH 81 MG  TBEC (ASPIRIN) Take 1 tablet by mouth once a day FUROSEMIDE 80 MG  TABS (FUROSEMIDE) Take 1 tablet by mouth once a day CARDIZEM CD 360 MG  CP24 (DILTIAZEM HCL COATED BEADS) Take 1 capsule by mouth once a day KLOR-CON M20 20 MEQ  TBCR (POTASSIUM CHLORIDE CRYS CR) Take 1 tablet by mouth two times a day ALPRAZOLAM 0.25 MG  TABS (ALPRAZOLAM) Take 1 tablet by mouth once daily as needed severe anxiety BALANCED CARE   TABS (MULTIPLE VITAMINS-MINERALS) Take 1 tablet by mouth once a day CALCIUM 600   TABS (CALCIUM CARBONATE TABS) Take 1 tablet by mouth two times a day ALBERTSONS NATURAL MAGNESIUM   TABS (MAGNESIUM TABS) Take 1 tablet by mouth once a day * GLUCOMETER to check glucose once daily and as needed for DM 250.0 * ACCU CHECK AVIVA STRIPS AND SOFTCLIX LANCETS use to check blood sugar once daily and as needed for DM ALEVE 220 MG TABS (NAPROXEN SODIUM) 1-2 by mouth two times a day as needed gout -- do not use for more than 2-3 days

## 2010-03-25 NOTE — Consult Note (Signed)
Summary: [facility]/Intensive Diabetes Self-[facility]  [facility]Intensive Diabetes Self-[facility]   Imported By: Eleonore Chiquito 03/21/2008 10:55:30  _____________________________________________________________________  External Attachment:    Type:   Image     Comment:   External Document

## 2010-03-25 NOTE — Letter (Signed)
Summary: [facility]  [facility]   Imported By: Maryln Gottron 10/28/2008 15:37:17  _____________________________________________________________________  External Attachment:    Type:   Image     Comment:   External Document

## 2010-03-25 NOTE — Assessment & Plan Note (Signed)
Summary: DIZZY/EARS/DLO   Vital Signs:  Patient Profile:   75 Years Old Female Weight:      262 pounds Temp:     97.5 degrees F oral Pulse rate:   80 / minute Pulse rhythm:   regular BP sitting:   144 / 88  (left arm) Cuff size:   large  Vitals Entered By: Liane Comber (October 22, 2007 12:16 PM)                 Chief Complaint:  dizziness since sat.  History of Present Illness: dizziness started on sat-- during facial wax  went to get up and felt like she was falling and then room was spinning   (also colored her hair ) went down gently to the floor  had some water and went home off and on at home-- much worse when she lay down propped herself up to sleep got up to go to the bathroom -- was dizzy each time some nausea and waterbrash as well  yeaterday had symptoms off and on  bp at church was 160/80  thinks she has had vertigo in the past   now sinuses are draining and a little bit of ear pain   is leaving wed to go to [place]      Current Allergies (reviewed today): ! BUSPAR  Past Medical History:    Reviewed history from 09/06/2007 and no changes required:       Allergic rhinitis       Anxiety       Depression       Hyperlipidemia       Hypertension       obesity       compulsive overeating        OA knees with injections   Past Surgical History:    Reviewed history from 12/21/2006 and no changes required:       Hysterectomy- partial, fibroids       Lumpectomy, right x1 (1989),   left x 2 (1970's, 1990)       Colonoscopy- diverticulosis (12/2000)       Pelvic US (2000, 2001)       Dexa- normal (04/2000)       Cardiac cath- normal per pt (2001)       Sleep study (06/2003)       Colonoscopy- diverticulosis, hemorrhoids (10/2004)       Adenosine cardiolite- low risk (02/2006)   Family History:    Reviewed history from 10/10/2007 and no changes required:       Father: died age 48- CVA       Mother: HTN (has a lot of animosity in her  relationship with her mother)       Siblings: 2 brothers- 1, prostate cancer. 1, Hep C. 1 sister, breast cancer       brother is addicted to drugs          Social History:    Reviewed history from 09/06/2007 and no changes required:       Marital Status: widow       Children: 1 daughter       Occupation: retired principal       non smoker       no alcohol     Review of Systems  General      Denies chills and fever.  Eyes      Denies blurring.      her glasses feel heavy  ENT  Complains of nasal congestion and postnasal drainage.      Denies ear discharge and hoarseness.  Resp      Denies cough.  GI      Complains of nausea.      Denies vomiting.  Derm      Denies itching and rash.  Neuro      Complains of sensation of room spinning.      Denies falling down, headaches, inability to speak, memory loss, numbness, tingling, and visual disturbances.  Psych      mood has been overall better   Endo      Denies excessive thirst and excessive urination.   Physical Exam  General:     overweight but generally well appearing  Head:     normocephalic, atraumatic, and no abnormalities observed.  no sinus tenderness  Eyes:     vision grossly intact, pupils equal, pupils round, and pupils reactive to light.  2-3 beats of horizontal nystagmus  Ears:     R ear normal and L ear normal.   Nose:     nares are boggy- pale  Mouth:     pharynx pink and moist, no erythema, and no exudates.   Neck:     supple with full rom and no masses or thyromegally, no JVD or carotid bruit  Chest Wall:     No deformities, masses, or tenderness noted. Lungs:     Normal respiratory effort, chest expands symmetrically. Lungs are clear to auscultation, no crackles or wheezes. Heart:     Normal rate and regular rhythm. S1 and S2 normal without gallop, murmur, click, rub or other extra sounds. Abdomen:     soft and non-tender.   Msk:     No deformity or scoliosis noted of thoracic  or lumbar spine.   Extremities:     No clubbing, cyanosis, edema, or deformity noted with normal full range of motion of all joints.   Neurologic:     cranial nerves II-XII intact, strength normal in all extremities, sensation intact to light touch, sensation intact to pinprick, gait normal, DTRs symmetrical and normal, and toes down bilaterally on Babinski.  some unsteadiness on rhomberg Skin:     Intact without suspicious lesions or rashes Cervical Nodes:     No lymphadenopathy noted Psych:     normal affect, talkative and pleasant     Impression & Recommendations:  Problem # 1:  VERTIGO (ICD-780.4) Assessment: New symptoms of benign positional vertigo with some sinus congestion trial of meclizine three times a day as needed with caution update if worse or new symptoms (worse ha or rash) update if not imp 1 week adv to make head movements deliberate and slow  Her updated medication list for this problem includes:    Meclizine Hcl 25 Mg Tabs (Meclizine hcl) .Marland Kitchen... 1/2 to 1 by mouth up to three times a day as needed dizziness   Problem # 2:  HYPERTENSION (ICD-401.9) Assessment: Deteriorated bp up a little - poss from dizziness-- will continue to follo w Her updated medication list for this problem includes:    Toprol Xl 100 Mg Tb24 (Metoprolol succinate) .Marland Kitchen... 1 by mouth daily    Furosemide 80 Mg Tabs (Furosemide) .Marland Kitchen... Take 1 tablet by mouth once a day    Cardizem Cd 360 Mg Cp24 (Diltiazem hcl coated beads) .Marland Kitchen... Take 1 capsule by mouth once a day   Problem # 3:  ANXIETY (ICD-300.00) Assessment: Improved overall doing better  Her updated medication list  for this problem includes:    Alprazolam 0.25 Mg Tabs (Alprazolam) .Marland Kitchen... Take 1 tablet by mouth once daily as needed severe anxiety   Complete Medication List: 1)  Toprol Xl 100 Mg Tb24 (Metoprolol succinate) .Marland Kitchen.. 1 by mouth daily 2)  Adult Aspirin Ec Low Strength 81 Mg Tbec (Aspirin) .... Take 1 tablet by mouth once a  day 3)  Furosemide 80 Mg Tabs (Furosemide) .... Take 1 tablet by mouth once a day 4)  Cardizem Cd 360 Mg Cp24 (Diltiazem hcl coated beads) .... Take 1 capsule by mouth once a day 5)  Klor-con M20 20 Meq Tbcr (Potassium chloride crys cr) .... Take 1 tablet by mouth two times a day 6)  Alprazolam 0.25 Mg Tabs (Alprazolam) .... Take 1 tablet by mouth once daily as needed severe anxiety 7)  Balanced Care Tabs (Multiple vitamins-minerals) .... Take 1 tablet by mouth once a day 8)  Calcium 600 Tabs (Calcium carbonate tabs) .... Take 1 tablet by mouth two times a day 9)  [employer] Natural Magnesium Tabs (Magnesium tabs) .... Take 1 tablet by mouth once a day 10)  Ketoconazole 2 % Crea (Ketoconazole) .... Apply twice daily to affected area 11)  Meclizine Hcl 25 Mg Tabs (Meclizine hcl) .... 1/2 to 1 by mouth up to three times a day as needed dizziness   Patient Instructions: 1)  try to keep up a good water intake 2)  move slowly - avoid sudden head movements 3)  use meclizine as directed as needed for dizziness - warning it can make you sleepy  4)  update me if worse or if increased headache or any rash 5)  update me if not improving in a week    Prescriptions: MECLIZINE HCL 25 MG TABS (MECLIZINE HCL) 1/2 to 1 by mouth up to three times a day as needed dizziness  #30 x 1   Entered and Authorized by:   Judith Part MD   Signed by:   Judith Part MD on 10/22/2007   Method used:   Print then Give to Patient   RxID:   [id number]  ]

## 2010-03-25 NOTE — Progress Notes (Signed)
Summary: needs surgical clearance form   Phone Note Call from Patient Call back at Home Phone [phone]   Caller: Patient Call For: Judith Part MD Summary of Call: Patient called stating that you have the surgical clearance form that she needs to have her knee surgery. She says that Dr. Thad Ranger is waiting for the okay to perform the surgery.  Initial call taken by: Melody Comas,  September 01, 2009 4:22 PM  Follow-up for Phone Call        I put it in nurse IN box completed 1-2 days ago -- should be ready to go -- ? if it was faxed yet or not Follow-up by: Judith Part MD,  September 01, 2009 4:37 PM  Additional Follow-up for Phone Call Additional follow up Details #1::        Faxed pre operative clearance sheet with EKG and notes as instructed. Patient notified as instructed by telephone. Lewanda Rife LPN  September 01, 2009 4:58 PM

## 2010-03-25 NOTE — Assessment & Plan Note (Signed)
Summary: PAIN R BUTTOCK/CLE  Medications Added TOPROL XL 100 MG  TB24 (METOPROLOL SUCCINATE) at bedtime ADULT ASPIRIN EC LOW STRENGTH 81 MG  TBEC (ASPIRIN) Take 1 tablet by mouth once a day FUROSEMIDE 80 MG  TABS (FUROSEMIDE) Take 1 tablet by mouth once a day CARDIZEM CD 360 MG  CP24 (DILTIAZEM HCL COATED BEADS) Take 1 capsule by mouth once a day KLOR-CON M20 20 MEQ  TBCR (POTASSIUM CHLORIDE CRYS CR) Take 1 tablet by mouth once a day ALPRAZOLAM 0.25 MG  TABS (ALPRAZOLAM) Take 1 tablet by mouth two times a day BALANCED CARE   TABS (MULTIPLE VITAMINS-MINERALS) Take 1 tablet by mouth once a day CALCIUM 600   TABS (CALCIUM CARBONATE TABS) Take 1 tablet by mouth once a day [employer] NATURAL MAGNESIUM   TABS (MAGNESIUM TABS) Take 1 tablet by mouth once a day      Allergies Added: ! * SHRIMP  Vital Signs:  Patient Profile:   75 Years Old Female Weight:      251 pounds Temp:     98.8 degrees F oral Pulse rate:   79 / minute BP sitting:   125 / 81  (left arm) Cuff size:   large  Vitals Entered By: Cooper Render (August 24, 2006 8:48 AM)               Chief Complaint:  pain R) buttock.  History of Present Illness: Here due to pain in R buttock, onset x few wks.  Hurts to sit.  Knot in buttocks for yrs, not tender like now.  Taking nothing.  Has lost 9 lbs since 05/22/06.  Is seeing Judithe Modest re food issues.  Feels better about herself.  Current Allergies: ! * SHRIMP     Review of Systems      See HPI   Physical Exam  General:     alert, well-developed, well-nourished, well-hydrated, and overweight-appearing.   Head:     normocephalic.   Eyes:     no injection.   Lungs:     normal respiratory effort.   Msk:     tender with palp central R buttock, firm mass present L and R, feel this is the pelvis, not movable, tissue moves over the mass Neurologic:     alert & oriented X3, sensation intact to light touch, and gait normal.   Skin:     turgor normal, color normal,  and no ecchymoses or eryth in area of tenderness.   Psych:     normally interactive.      Impression & Recommendations:  Problem # 1:  PELVIC PAIN, ACUTE (ICD-789.09)  Her updated medication list for this problem includes:    Adult Aspirin Ec Low Strength 81 Mg Tbec (Aspirin) .Marland Kitchen... Take 1 tablet by mouth once a day x ray pelvis--notify  Medications Added to Medication List This Visit: 1)  Toprol Xl 100 Mg Tb24 (Metoprolol succinate) .... At bedtime 2)  Adult Aspirin Ec Low Strength 81 Mg Tbec (Aspirin) .... Take 1 tablet by mouth once a day 3)  Furosemide 80 Mg Tabs (Furosemide) .... Take 1 tablet by mouth once a day 4)  Cardizem Cd 360 Mg Cp24 (Diltiazem hcl coated beads) .... Take 1 capsule by mouth once a day 5)  Klor-con M20 20 Meq Tbcr (Potassium chloride crys cr) .... Take 1 tablet by mouth once a day 6)  Alprazolam 0.25 Mg Tabs (Alprazolam) .... Take 1 tablet by mouth two times a day 7)  Balanced  Care Tabs (Multiple vitamins-minerals) .... Take 1 tablet by mouth once a day 8)  Calcium 600 Tabs (Calcium carbonate tabs) .... Take 1 tablet by mouth once a day 9)  [employer] Natural Magnesium Tabs (Magnesium tabs) .... Take 1 tablet by mouth once a day   Patient Instructions: 1)  will refer if necessary or other dx tests as needed

## 2010-03-25 NOTE — Assessment & Plan Note (Signed)
Summary: F/U/CLE   Vital Signs:  Patient Profile:   75 Years Old Female Weight:      264 pounds Temp:     98.1 degrees F oral Pulse rate:   76 / minute Pulse rhythm:   regular BP sitting:   148 / 90  (left arm) Cuff size:   large  Vitals Entered By: Liane Comber (September 06, 2007 11:53 AM)                 Chief Complaint:  f/u.  History of Present Illness: finally got some relief for knee pain with injections will have to have knee replacements in future  saw pod for foot pain -- had stress fracture and wore a boot for a while  is very anxious and irritable  needs a drug for this  ? what she is upset about -- hard to say  it just happens  has been this way off and on in the past saw a counselor -in the past-- but she did not follow through  her mood is up and down-- so does not always share the right feelings  feels scattered and cluttered- gers upset easily does wright in a journal - for years   cannot loose wt loves cooking and eating and is not ready to give it up that is her only pleasure   started some water exercise - but just got back from vacation needs to start back- and the water feels great      Current Allergies (reviewed today): ! * SHRIMP  Past Medical History:    Allergic rhinitis    Anxiety    Depression    Hyperlipidemia    Hypertension    obesity    compulsive overeating     OA knees with injections   Past Surgical History:    Reviewed history from 12/21/2006 and no changes required:       Hysterectomy- partial, fibroids       Lumpectomy, right x1 (1989),   left x 2 (1970's, 1990)       Colonoscopy- diverticulosis (12/2000)       Pelvic US (2000, 2001)       Dexa- normal (04/2000)       Cardiac cath- normal per pt (2001)       Sleep study (06/2003)       Colonoscopy- diverticulosis, hemorrhoids (10/2004)       Adenosine cardiolite- low risk (02/2006)   Family History:    Reviewed history from 12/21/2006 and no changes  required:       Father: died age 21- CVA       Mother: HTN       Siblings: 2 brothers- 1, prostate cancer. 1, Hep C. 1 sister, breast cancer  Social History:    Marital Status: widow    Children: 1 daughter    Occupation: retired principal    non smoker    no alcohol     Review of Systems  General      Denies loss of appetite, malaise, weakness, and weight loss.  Eyes      Denies blurring and eye pain.  CV      Denies chest pain or discomfort, lightheadness, palpitations, and shortness of breath with exertion.  Resp      Denies cough, shortness of breath, and wheezing.      generally less exercise tolerant as she gains wt   GI      Denies abdominal pain and change  in bowel habits.  MS      Complains of joint pain and stiffness.      Denies joint redness and joint swelling.  Derm      Denies itching, lesion(s), and rash.  Neuro      Denies numbness and tingling.  Psych      Complains of anxiety, depression, easily angered, easily tearful, irritability, and panic attacks.      Denies sense of great danger and suicidal thoughts/plans.  Endo      Complains of heat intolerance.      Denies cold intolerance, excessive thirst, and excessive urination.  Heme      Denies abnormal bruising.   Physical Exam  General:     overweight but generally well appearing  Head:     normocephalic, atraumatic, and no abnormalities observed.   Eyes:     vision grossly intact, pupils equal, pupils round, and pupils reactive to light.   Neck:     supple with full rom and no masses or thyromegally, no JVD or carotid bruit  Chest Wall:     No deformities, masses, or tenderness noted. Lungs:     Normal respiratory effort, chest expands symmetrically. Lungs are clear to auscultation, no crackles or wheezes. Heart:     Normal rate and regular rhythm. S1 and S2 normal without gallop, murmur, click, rub or other extra sounds. Abdomen:     soft and non-tender.   Msk:     poor  rom knees with some crepitice  Pulses:     R and L carotid,radial,femoral,dorsalis pedis and posterior tibial pulses are full and equal bilaterally Extremities:     trace left pedal edema and trace right pedal edema.   Neurologic:     sensation intact to light touch, gait normal, and DTRs symmetrical and normal.   Skin:     Intact without suspicious lesions or rashes Cervical Nodes:     No lymphadenopathy noted Psych:     anxious affect, tearful at times communicates well with good eye contact     Impression & Recommendations:  Problem # 1:  ANXIETY (ICD-300.00) Assessment: Deteriorated worsened lately with increased emotional overeating  disc stress reaction/symptoms/tx opt in detail-- 30 minutes face to face time  adv to return to counseling and keep writing in a jounal  start buspar- update if side eff xanax as needed sparingly f/u 1 mo Her updated medication list for this problem includes:    Alprazolam 0.25 Mg Tabs (Alprazolam) .Marland Kitchen... Take 1 tablet by mouth once daily as needed severe anxiety    Buspar 15 Mg Tabs (Buspirone hcl) .Marland Kitchen... 1/2 by mouth two times a day   Problem # 2:  HYPERTENSION (ICD-401.9) Assessment: Unchanged fair bp today may imp with imp of anx labs then f/u in 1 mo Her updated medication list for this problem includes:    Toprol Xl 100 Mg Tb24 (Metoprolol succinate) .Marland Kitchen... 1 by mouth qd    Furosemide 80 Mg Tabs (Furosemide) .Marland Kitchen... Take 1 tablet by mouth once a day    Cardizem Cd 360 Mg Cp24 (Diltiazem hcl coated beads) .Marland Kitchen... Take 1 capsule by mouth once a day  BP today: 148/90 Prior BP: 148/82 (02/23/2007)  Labs Reviewed: Creat: 1.1 (05/22/2006) Chol: 194 (05/22/2006)   HDL: 41.4 (05/22/2006)   LDL: 126 (05/22/2006)   TG: 134 (05/22/2006)   Problem # 3:  HYPERLIPIDEMIA (ICD-272.4) Assessment: Unchanged overdue for check  suspect high with poor diet disc low sat fat diet  labs then f/u 38mo Labs Reviewed: Chol: 194 (05/22/2006)   HDL: 41.4  (05/22/2006)   LDL: 126 (05/22/2006)   TG: 134 (05/22/2006) SGOT: 38 (05/22/2006)   SGPT: 42 (05/22/2006)   Problem # 4:  OBESITY (ICD-278.00) Assessment: Deteriorated wt is going up with anx and out of control eating  adv to keep working on this with counselor- and try to find some other satisfying activities that do not involve eating or cooking   Complete Medication List: 1)  Toprol Xl 100 Mg Tb24 (Metoprolol succinate) .Marland Kitchen.. 1 by mouth qd 2)  Adult Aspirin Ec Low Strength 81 Mg Tbec (Aspirin) .... Take 1 tablet by mouth once a day 3)  Furosemide 80 Mg Tabs (Furosemide) .... Take 1 tablet by mouth once a day 4)  Cardizem Cd 360 Mg Cp24 (Diltiazem hcl coated beads) .... Take 1 capsule by mouth once a day 5)  Klor-con M20 20 Meq Tbcr (Potassium chloride crys cr) .... Take 1 tablet by mouth two times a day 6)  Alprazolam 0.25 Mg Tabs (Alprazolam) .... Take 1 tablet by mouth once daily as needed severe anxiety 7)  Balanced Care Tabs (Multiple vitamins-minerals) .... Take 1 tablet by mouth once a day 8)  Calcium 600 Tabs (Calcium carbonate tabs) .... Take 1 tablet by mouth once a day 9)  [employer] Natural Magnesium Tabs (Magnesium tabs) .... Take 1 tablet by mouth once a day 10)  Ketoconazole 2 % Crea (Ketoconazole) .... Apply twice daily to affected area 11)  Buspar 15 Mg Tabs (Buspirone hcl) .... 1/2 by mouth two times a day   Patient Instructions: 1)  take the buspar twice daily as directed  2)  if you get any side effects or if you suddenly become more anxious or depressed (suicidal)- call asap 3)  use the xanax as needed for emergencies 4)  start exercising regularly 5)  schedule fasting labs when able - wellness/ lipid 401.1, 272 6)  follow up in 1 month   Prescriptions: BUSPAR 15 MG  TABS (BUSPIRONE HCL) 1/2 by mouth two times a day  #30 x 3   Entered and Authorized by:   Judith Part MD   Signed by:   Judith Part MD on 09/06/2007   Method used:   Print then Give to  Patient   RxID:   [id number] ALPRAZOLAM 0.25 MG  TABS (ALPRAZOLAM) Take 1 tablet by mouth once daily as needed severe anxiety  #30 x 0   Entered and Authorized by:   Judith Part MD   Signed by:   Judith Part MD on 09/06/2007   Method used:   Print then Give to Patient   RxID:   [id number]  ]

## 2010-03-25 NOTE — Assessment & Plan Note (Signed)
Summary: SWOLLEN ANKLES,FEET/CLE   Vital Signs:  Patient profile:   75 year old female Height:      66.25 inches Weight:      256 pounds BMI:     41.16 Temp:     98.4 degrees F oral Pulse rate:   72 / minute Pulse rhythm:   regular BP sitting:   120 / 80  (left arm) Cuff size:   large  Vitals Entered By: Benny Lennert CMA Duncan Dull) (April 23, 2009 11:55 AM)  History of Present Illness: Chief complaint swollen ankle & feet and extreme fatigue  75 year old female:  Multiple medical problems, BMI = 41.  Swelling, B LE. Started in January, Dr. Milinda Antis gave some diuretics - and has been ongoing. Now with extensive pedal edema.  RFP and Cardiac BNP essentially normal. Cardiac BNP 115.  Has tried some compression stocking but could not really tolerate - she is unsure of their strength of compression.   o/w c/o fatigue, generally not feeling that well, no fever or chills. No CP.  GEN: WDWN, NAD, Non-toxic, A & O x 3 HEENT: Atraumatic, Normocephalic. Neck supple. No masses, No LAD. Ears and Nose: No external deformity. CV: RRR, No M/G/R. No JVD. No thrill. No extra heart sounds. PULM: CTA B, no wheezes, crackles, rhonchi. No retractions. No resp. distress. No accessory muscle use. ABD: S, NT, ND, +BS. No rebound tenderness. No HSM.  EXTR: 3+ pedal edema and 2+ LE other edema NEURO: Normal gait.  PSYCH: Normally interactive. Conversant. Not depressed or anxious appearing.  Calm demeanor.    Allergies: 1)  ! Buspar 2)  Lisinopril (Lisinopril)  Past History:  Past medical, surgical, family and social histories (including risk factors) reviewed, and no changes noted (except as noted below).  Past Medical History: Reviewed history from 02/27/2008 and no changes required. Allergic rhinitis Anxiety Depression Hyperlipidemia Hypertension obesity compulsive overeating  OA knees with injections  DM2  Past Surgical History: Reviewed history from 12/21/2006 and no changes  required. Hysterectomy- partial, fibroids Lumpectomy, right x1 (1989),   left x 2 (1970's, 1990) Colonoscopy- diverticulosis (12/2000) Pelvic US (2000, 2001) Dexa- normal (04/2000) Cardiac cath- normal per pt (2001) Sleep study (06/2003) Colonoscopy- diverticulosis, hemorrhoids (10/2004) Adenosine cardiolite- low risk (02/2006)  Family History: Reviewed history from 06/02/2008 and no changes required. Father: died age 78- CVA Mother: HTN (has a lot of animosity in her relationship with her mother) Siblings: 2 brothers- 1, prostate cancer. 1, Hep C. 1 sister, breast cancer brother is addicted to drugs  brother with hep C and alcoholism (terminal)  Social History: Reviewed history from 06/02/2008 and no changes required. 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 Normal cardiac BNP, very unlikely CHF. Normal renal function.  Medication SE possible, and Ca-channel blockers often do this. She has no arrythmia to her knowledge or that I can tell from chart review. I am going to d/c her Cardizem, alter BP meds, and have f/u with Dr. Milinda Antis in a couple of weeks.  15 mm Hg compression stockings are a good idea.  Her updated medication list for this problem includes:    Furosemide 80 Mg Tabs (Furosemide) .Marland Kitchen... Take 1 tablet by mouth once a day    Hydrochlorothiazide 12.5 Mg Caps (Hydrochlorothiazide) .Marland KitchenMarland KitchenMarland KitchenMarland Kitchen  1 by mouth daily  Problem # 2:  HYPERTENSION (ICD-401.9)  The following medications were removed from the medication list:    Cardizem Cd 360 Mg Cp24 (Diltiazem hcl coated beads) .Marland Kitchen... Take 1 capsule by mouth once a day Her updated medication list for this problem includes:    Toprol Xl 100 Mg Tb24 (Metoprolol succinate) .Marland Kitchen... 1 by mouth daily     Furosemide 80 Mg Tabs (Furosemide) .Marland Kitchen... Take 1 tablet by mouth once a day    Hydrochlorothiazide 12.5 Mg Caps (Hydrochlorothiazide) .Marland Kitchen... 1 by mouth daily  Complete Medication List: 1)  Toprol Xl 100 Mg Tb24 (Metoprolol succinate) .Marland Kitchen.. 1 by mouth daily 2)  Adult Aspirin Ec Low Strength 81 Mg Tbec (Aspirin) .... Take 1 tablet by mouth once a day 3)  Furosemide 80 Mg Tabs (Furosemide) .... Take 1 tablet by mouth once a day 4)  Klor-con M20 20 Meq Tbcr (Potassium chloride crys cr) .... Take 1 tablet by mouth two times a day 5)  Balanced Care Tabs (Multiple vitamins-minerals) .... Take 1 tablet by mouth once a day 6)  Calcium 600 Tabs (Calcium carbonate tabs) .... Take 1 tablet by mouth two times a day 7)  [employer] Natural Magnesium Tabs (Magnesium tabs) .... Take 1 tablet by mouth once a day 8)  Glucometer  .... To check glucose once daily and as needed for dm 250.0 9)  Accu Check Aviva Strips and Softclix Lancets  .... Use to check blood sugar once daily and as needed for dm 10)  Cyclobenzaprine Hcl 10 Mg Tabs (Cyclobenzaprine hcl) .... Take one by mouth every 8 hours as needed for neck pain 11)  Fish Oil 1000 Mg Caps (Omega-3 fatty acids) .Marland Kitchen.. 1 by mouth once daily 12)  Buspirone Hcl 15 Mg Tabs (Buspirone hcl) .... 1/2 by mouth two times a day for anxiety 13)  Hydrochlorothiazide 12.5 Mg Caps (Hydrochlorothiazide) .Marland Kitchen.. 1 by mouth daily  Patient Instructions: 1)  f/u Dr. Milinda Antis in 2 weeks Prescriptions: HYDROCHLOROTHIAZIDE 12.5 MG CAPS (HYDROCHLOROTHIAZIDE) 1 by mouth daily  #30 x 5   Entered and Authorized by:   Hannah Beat MD   Signed by:   Hannah Beat MD on 04/23/2009   Method used:   Electronically to        [facility]* (retail)       [address]       [place], [place]  [address]       Ph: [phone]       Fax: [phone]   RxID:   [id number]   Current Allergies (reviewed today): ! BUSPAR LISINOPRIL (LISINOPRIL)

## 2010-03-25 NOTE — Assessment & Plan Note (Signed)
Summary: DISCUSS MEDS/CLE   Vital Signs:  Patient profile:   75 year old female Height:      66.25 inches Weight:      246.25 pounds BMI:     39.59 Temp:     98.2 degrees F oral Pulse rate:   64 / minute Pulse rhythm:   regular BP sitting:   130 / 78  (left arm) Cuff size:   large  Vitals Entered By: Lewanda Rife LPN (December 09, 2009 3:51 PM) CC: discuss meds   History of Present Illness: here for f/u of depression and anxiety  she had a knee replacement and got down afterwards  overall the surgery went extremely well  Dr Priscille Kluver did her surgery is using cane  is getting ready to go to PT in [place]   still having quite a bit of post surgical pain  was letting med get out of system -- and then had to put herself on a schedule   call from [place] nurse- gloomy/ sad and also anxious (about having other knee done) was put on low dose zoloft  got down after having to depend on someone for everything and not getting out  tends to be a very impatient person  her daughter is here from [place] - and still driving for her  overall is improved   will eventually need to get other knee done   was at [facility] place for 21/2 weeks   needs refil of meds   had 2 bouts of gout -- last one in the toe   ? high uric acid in the past -- over 7?  eats a low purine diet        Allergies: 1)  ! Buspar 2)  ! * Vit C 3)  Lisinopril (Lisinopril)  Past History:  Past Medical History: Last updated: 02/27/2008 Allergic rhinitis Anxiety Depression Hyperlipidemia Hypertension obesity compulsive overeating  OA knees with injections  DM2  Past Surgical History: Last updated: 12/21/2006 Hysterectomy- partial, fibroids Lumpectomy, right x1 (1989),   left x 2 (1970's, 1990) Colonoscopy- diverticulosis (12/2000) Pelvic US (2000, 2001) Dexa- normal (04/2000) Cardiac cath- normal per pt (2001) Sleep study (06/2003) Colonoscopy- diverticulosis, hemorrhoids (10/2004) Adenosine  cardiolite- low risk (02/2006)  Family History: Last updated: 2008/06/15 Father: died age 12- CVA Mother: HTN (has a lot of animosity in her relationship with her mother) Siblings: 2 brothers- 1, prostate cancer. 1, Hep C. 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 (approx 20 yrs ago)  Risk Factors: Alcohol Use: <1 (06/23/2009)  Risk Factors: Smoking Status: quit (06/23/2009) Packs/Day: 0.5 (06/23/2009)  Review of Systems General:  Complains of fatigue; denies fever, loss of appetite, and malaise. Eyes:  Denies blurring and eye irritation. CV:  Denies chest pain or discomfort, lightheadness, palpitations, and shortness of breath with exertion. Resp:  Denies cough and shortness of breath. GI:  Denies abdominal pain and change in bowel habits. GU:  Denies dysuria and urinary frequency. MS:  Complains of joint pain; denies stiffness. Derm:  Denies itching, lesion(s), poor wound healing, and rash. Neuro:  Denies headaches, numbness, and tingling. Psych:  Complains of anxiety and depression; denies panic attacks, sense of great danger, and suicidal thoughts/plans. Endo:  Denies cold intolerance, excessive thirst, excessive urination, and  heat intolerance. Heme:  Denies abnormal bruising and bleeding.  Physical Exam  General:  obese and well appearing  Head:  normocephalic, atraumatic, and no abnormalities observed.   Eyes:  vision grossly intact, pupils equal, pupils round, and pupils reactive to light.   Mouth:  pharynx pink and moist.   Neck:  supple with full rom and no masses or thyromegally, no JVD or carotid bruit  Chest Wall:  No deformities, masses, or tenderness noted. Lungs:  Normal respiratory effort, chest  expands symmetrically. Lungs are clear to auscultation, no crackles or wheezes. Heart:  Normal rate and regular rhythm. S1 and S2 normal without gallop, murmur, click, rub or other extra sounds. Abdomen:  Bowel sounds positive,abdomen soft and non-tender without masses, organomegaly or hernias noted. Msk:  healing knee incision with fair rom  Pulses:  R and L carotid,radial,femoral,dorsalis pedis and posterior tibial pulses are full and equal bilaterally Extremities:  trace edema bilat- much improved Neurologic:  sensation intact to light touch, gait normal, and DTRs symmetrical and normal.   Skin:  Intact without suspicious lesions or rashes Cervical Nodes:  No lymphadenopathy noted Psych:  is tearful at times but overall pleasant and talkative   Diabetes Management Exam:    Foot Exam (with socks and/or shoes not present):       Sensory-Pinprick/Light touch:          Left medial foot (L-4): normal          Left dorsal foot (L-5): normal          Left lateral foot (S-1): normal          Right medial foot (L-4): normal          Right dorsal foot (L-5): normal          Right lateral foot (S-1): normal       Sensory-Monofilament:          Left foot: normal          Right foot: normal       Inspection:          Left foot: normal          Right foot: normal       Nails:          Left foot: normal          Right foot: normal   Impression & Recommendations:  Problem # 1:  GOUT, UNSPECIFIED (ICD-274.9) Assessment Deteriorated check uric acid stopped one of her fluid pills hctz - hope this will help consider allopurinol if recurrent Her updated medication list for this problem includes:    Colcrys 0.6 Mg Tabs (Colchicine) .Marland Kitchen... 1 by mouth two times a day as needed gout- take with food  Orders: Venipuncture (95621) TLB-Lipid Panel (80061-LIPID) TLB-Renal Function Panel (80069-RENAL) TLB-Hepatic/Liver Function Pnl (80076-HEPATIC) TLB-ALT (SGPT) (84460-ALT) TLB-AST (SGOT)  (84450-SGOT) TLB-A1C / Hgb A1C (Glycohemoglobin) (83036-A1C) TLB-Uric Acid, Blood (84550-URIC) Prescription Created Electronically [phone])  Problem # 2:  DIABETES MELLITUS, TYPE II, MILD (ICD-250.00) Assessment: Unchanged  AIC today wt is down and eating better f/u 3 months Her updated medication list for this problem includes:    Adult Aspirin Ec Low Strength 81 Mg Tbec (Aspirin) .Marland Kitchen... Take 1 tablet by mouth once a day  Orders: Venipuncture (78469) TLB-Lipid Panel (80061-LIPID) TLB-Renal Function Panel (80069-RENAL) TLB-Hepatic/Liver Function Pnl (80076-HEPATIC) TLB-ALT (SGPT) (84460-ALT) TLB-AST (SGOT) (84450-SGOT) TLB-A1C / Hgb A1C (Glycohemoglobin) (83036-A1C) TLB-Uric Acid, Blood (84550-URIC) Prescription Created Electronically [phone])  Labs Reviewed: Creat: 1.0 (  06/16/2009)     Last Eye Exam: normal (11/21/2008) Reviewed HgBA1c results: 6.0 (06/16/2009)  6.2 (12/10/2008)  Problem # 3:  HYPERTENSION (ICD-401.9) Assessment: Unchanged  good control stopped hctz for gout  lab today The following medications were removed from the medication list:    Hydrochlorothiazide 12.5 Mg Caps (Hydrochlorothiazide) .Marland Kitchen... 1 by mouth daily Her updated medication list for this problem includes:    Toprol Xl 100 Mg Tb24 (Metoprolol succinate) .Marland Kitchen... 1 by mouth daily    Furosemide 80 Mg Tabs (Furosemide) .Marland Kitchen... Take 1 tablet by mouth once a day  Orders: Venipuncture (04540) TLB-Lipid Panel (80061-LIPID) TLB-Renal Function Panel (80069-RENAL) TLB-Hepatic/Liver Function Pnl (80076-HEPATIC) TLB-ALT (SGPT) (84460-ALT) TLB-AST (SGOT) (84450-SGOT) TLB-A1C / Hgb A1C (Glycohemoglobin) (83036-A1C) TLB-Uric Acid, Blood (84550-URIC) Prescription Created Electronically ([id number])  BP today: 130/78 Prior BP: 140/88 (08/26/2009)  Labs Reviewed: K+: 3.5 (06/16/2009) Creat: : 1.0 (06/16/2009)   Chol: 211 (06/16/2009)   HDL: 42.70 (06/16/2009)   LDL: 136 (06/02/2008)   TG: 246.0  (06/16/2009)  Problem # 4:  HYPERLIPIDEMIA (ICD-272.4) Assessment: Unchanged  check today with better diet  Orders: Venipuncture (14782) TLB-Lipid Panel (80061-LIPID) TLB-Renal Function Panel (80069-RENAL) TLB-Hepatic/Liver Function Pnl (80076-HEPATIC) TLB-ALT (SGPT) (84460-ALT) TLB-AST (SGOT) (84450-SGOT) TLB-A1C / Hgb A1C (Glycohemoglobin) (83036-A1C) TLB-Uric Acid, Blood (84550-URIC) Prescription Created Electronically ([phone])  Labs Reviewed: SGOT: 24 (06/16/2009)   SGPT: 22 (06/16/2009)   HDL:42.70 (06/16/2009), 47.40 (12/10/2008)  LDL:136 (06/02/2008), 134 (12/13/2007)  Chol:211 (06/16/2009), 202 (12/10/2008)  Trig:246.0 (06/16/2009), 81.0 (12/10/2008)  Problem # 5:  ANXIETY (ICD-300.00) Assessment: Improved  with depression post op is imp with zoloft counseling again in future may help too  f/u 3 mo  Her updated medication list for this problem includes:    Zoloft 50 Mg Tabs (Sertraline hcl) .Marland Kitchen... 1/2 by mouth once daily in evening for 1 week and then increase to 1 by mouth once daily  Orders: Prescription Created Electronically [id number])  Complete Medication List: 1)  Toprol Xl 100 Mg Tb24 (Metoprolol succinate) .Marland Kitchen.. 1 by mouth daily 2)  Adult Aspirin Ec Low Strength 81 Mg Tbec (Aspirin) .... Take 1 tablet by mouth once a day 3)  Furosemide 80 Mg Tabs (Furosemide) .... Take 1 tablet by mouth once a day 4)  Klor-con M20 20 Meq Tbcr (Potassium chloride crys cr) .... Take 1 tablet by mouth two times a day 5)  Balanced Care Tabs (Multiple vitamins-minerals) .... Take 1 tablet by mouth once a day 6)  Calcium 600 Tabs (Calcium carbonate tabs) .... Take 1 tablet by mouth two times a day 7)  Glucometer  .... To check glucose once daily and as needed for dm 250.0 8)  Accu Check Aviva Strips and Softclix Lancets  .... Use to check blood sugar once daily and as needed for dm 9)  Tramadol Hcl 50 Mg Tabs (Tramadol hcl) .... Take 1 tablet by mouth every 4- 6 hours as needed 10)   Colcrys 0.6 Mg Tabs (Colchicine) .Marland Kitchen.. 1 by mouth two times a day as needed gout- take with food 11)  Zoloft 50 Mg Tabs (Sertraline hcl) .... 1/2 by mouth once daily in evening for 1 week and then increase to 1 by mouth once daily 12)  Flax Oil (Flaxseed (linseed)) .... Take 1 capsule by mouth once a day 13)  Mag Oxide Over The Counter 400 Mg  .Marland KitchenMarland Kitchen. 1 by mouth once daily  Patient Instructions: 1)  if you have one more bout of gout --we may consider allopurinol (but taking you off of  the hctz fluid pill will help) 2)  labs today 3)  go foward with physical therapy 4)  continue zoloft for now  5)  follow up with me in 3 months  6)  continue staying away from fast foods - the weight loss is great  Prescriptions: TOPROL XL 100 MG  TB24 (METOPROLOL SUCCINATE) 1 by mouth daily  #30 x 11   Entered and Authorized by:   Judith Part MD   Signed by:   Judith Part MD on 12/09/2009   Method used:   Electronically to        [facility]* (retail)       [address]       [place], [place]  [address]       Ph: [phone]       Fax: [phone]   RxID:   [id number] FUROSEMIDE 80 MG  TABS (FUROSEMIDE) Take 1 tablet by mouth once a day  #30 x 11   Entered and Authorized by:   Judith Part MD   Signed by:   Judith Part MD on 12/09/2009   Method used:   Electronically to        [facility]* (retail)       [address]       [place], [place]  [address]       Ph: [phone]       Fax: [phone]   RxID:   [id number]    Orders Added: 1)  Venipuncture [32202] 2)  TLB-Lipid Panel [80061-LIPID] 3)  TLB-Renal Function Panel [80069-RENAL] 4)  TLB-Hepatic/Liver Function Pnl [80076-HEPATIC] 5)  TLB-ALT (SGPT) [84460-ALT] 6)  TLB-AST (SGOT) [84450-SGOT] 7)  TLB-A1C / Hgb A1C (Glycohemoglobin) [83036-A1C] 8)  TLB-Uric Acid, Blood [84550-URIC] 9)  Est. Patient Level IV [54270] 10)  Prescription Created Electronically [[id number]]    Current Allergies (reviewed today): !  BUSPAR ! * VIT C LISINOPRIL (LISINOPRIL)

## 2010-03-25 NOTE — Miscellaneous (Signed)
Summary: Controlled Substance Agreement  Controlled Substance Agreement   Imported By: Lanelle Bal 12/17/2009 12:28:54  _____________________________________________________________________  External Attachment:    Type:   Image     Comment:   External Document

## 2010-03-25 NOTE — Assessment & Plan Note (Signed)
Summary: FOLLOW UP   Vital Signs:  Patient profile:   75 year old female Height:      66.25 inches Weight:      255.50 pounds BMI:     41.08 Temp:     98.3 degrees F oral Pulse rate:   76 / minute Pulse rhythm:   regular BP sitting:   134 / 80  (left arm) Cuff size:   large  Vitals Entered By: Lewanda Rife LPN (June 16, 2009 3:21 PM) CC: follow up on swelling   History of Present Illness: here for f/u of edema   saw Dr Patsy Lager for this --and fatigue he rev her records and changed her cardizem to hct (pt is already on lasix) she thinks her swelling is overall improved --still there on and off   no cramping   has sore L elbow -- and that makes her arm tingle  lying with that arm bent makes it worse    bp today ok at 134/80  wt is the same   is looking into getting R knee replacement -- with Dr Priscille Kluver -- needs cardiac clearance  will also need dental clearance   no other new med intolerances besides other than vit C  knee pain is really bad -- tried aleve (forgot that this could cause swelling)   no anesthesia problems - no n/v , has had a lot of surgeries   had low risk cardiolite in 08  has sleep apnea and had cpap for a while       Allergies: 1)  ! Buspar 2)  ! * Vit C 3)  Lisinopril (Lisinopril)  Past History:  Past Medical History: Last updated: 02/27/2008 Allergic rhinitis Anxiety Depression Hyperlipidemia Hypertension obesity compulsive overeating  OA knees with injections  DM2  Past Surgical History: Last updated: 12/21/2006 Hysterectomy- partial, fibroids Lumpectomy, right x1 (1989),   left x 2 (1970's, 1990) Colonoscopy- diverticulosis (12/2000) Pelvic US (2000, 2001) Dexa- normal (04/2000) Cardiac cath- normal per pt (2001) Sleep study (06/2003) Colonoscopy- diverticulosis, hemorrhoids (10/2004) Adenosine cardiolite- low risk (02/2006)  Family History: Last updated: 06-03-08 Father: died age 30- CVA Mother: HTN (has a lot  of animosity in her relationship with her mother) Siblings: 2 brothers- 1, prostate cancer. 1, Hep C. 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 Eyes:  Denies blurring and eye pain. CV:  Complains of swelling of feet; denies chest pain or discomfort, palpitations, and shortness of breath with exertion. Resp:  Denies chest discomfort, cough, shortness of breath, and wheezing. GI:  Denies abdominal pain, change in bowel habits, indigestion, nausea, and vomiting. MS:  Complains of joint pain and stiffness; denies joint redness and joint swelling. Derm:  Denies itching, lesion(s), poor wound healing, and rash. Neuro:  Denies numbness, tingling, tremors, and weakness. Psych:  Complains of anxiety. Endo:  Denies cold intolerance, excessive thirst, excessive urination, and heat intolerance. Heme:  Denies abnormal bruising and bleeding.  Physical Exam  General:  obese and well appearing  Head:  normocephalic, atraumatic, and no abnormalities observed.   Eyes:  vision grossly intact, pupils equal, pupils round, and pupils reactive to light.  Mouth:  pharynx pink and moist.   Neck:  supple with full rom and no masses or thyromegally, no JVD or carotid bruit  Chest Wall:  No deformities, masses, or tenderness noted. Lungs:  Normal respiratory effort, chest expands symmetrically. Lungs are clear to auscultation, no crackles or wheezes. Heart:  Normal rate and regular rhythm. S1 and S2 normal without gallop, murmur, click, rub or other extra sounds. Abdomen:  Bowel sounds positive,abdomen soft and non-tender without  masses, organomegaly or hernias noted. no renal bruits  Msk:  No deformity or scoliosis noted of thoracic or lumbar spine.  poor rom knees with labored gait  Pulses:  R and L carotid,radial,femoral,dorsalis pedis and posterior tibial pulses are full and equal bilaterally Extremities:  trace edema bilat- much improved Neurologic:  sensation intact to light touch, gait normal, and DTRs symmetrical and normal.   Skin:  Intact without suspicious lesions or rashes Cervical Nodes:  No lymphadenopathy noted Inguinal Nodes:  No significant adenopathy Psych:  mildly anxious today not tearful  Diabetes Management Exam:    Foot Exam (with socks and/or shoes not present):       Sensory-Pinprick/Light touch:          Left medial foot (L-4): normal          Left dorsal foot (L-5): normal          Left lateral foot (S-1): normal          Right medial foot (L-4): normal          Right dorsal foot (L-5): normal          Right lateral foot (S-1): normal       Sensory-Monofilament:          Left foot: normal          Right foot: normal       Inspection:          Left foot: normal          Right foot: normal       Nails:          Left foot: normal          Right foot: normal   Impression & Recommendations:  Problem # 1:  EDEMA (ICD-782.3) Assessment Improved improved with addn of another diuretic and stopping ca channel blocker lab today disc imp of low salt diet/ wt loss and better water intake adv to elevate legs when able  Her updated medication list for this problem includes:    Furosemide 80 Mg Tabs (Furosemide) .Marland Kitchen... Take 1 tablet by mouth once a day    Hydrochlorothiazide 12.5 Mg Caps (Hydrochlorothiazide) .Marland Kitchen... 1 by mouth daily  Problem # 2:  PREOPERATIVE EXAMINATION (ICD-V72.84) Assessment: New  reviewed med allergies and past anesthesia experience no changes on EKG today  rev last stress test  labs done  in light of plan for general anesthesia for knee repl- I do feel re eval  and tx of sleep apnea needs to be tackled before surgery she was ref to pulm to start process as she was unhappy with last eval  Orders: EKG w/ Interpretation (93000)  Problem # 3:  HYPERTENSION (ICD-401.9) Assessment: Unchanged  bp in good control with med change disc healthy diet (low simple sugar/ choose complex carbs/ low sat fat) diet and exercise in detail  lab today  Her updated medication list for this problem includes:    Toprol Xl 100 Mg Tb24 (Metoprolol succinate) .Marland Kitchen... 1 by mouth  daily    Furosemide 80 Mg Tabs (Furosemide) .Marland Kitchen... Take 1 tablet by mouth once a day    Hydrochlorothiazide 12.5 Mg Caps (Hydrochlorothiazide) .Marland Kitchen... 1 by mouth daily  Orders: Venipuncture (95638) TLB-Lipid Panel (80061-LIPID) TLB-Renal Function Panel (80069-RENAL) TLB-ALT (SGPT) (84460-ALT) TLB-AST (SGOT) (84450-SGOT) TLB-A1C / Hgb A1C (Glycohemoglobin) (83036-A1C) EKG w/ Interpretation (93000)  BP today: 134/80 Prior BP: 120/80 (04/23/2009)  Labs Reviewed: K+: 4.1 (03/05/2009) Creat: : 1.0 (03/05/2009)   Chol: 202 (12/10/2008)   HDL: 47.40 (12/10/2008)   LDL: 136 (06/02/2008)   TG: 81.0 (12/10/2008)  Problem # 4:  DIABETES MELLITUS, TYPE II, MILD (ICD-250.00) Assessment: Unchanged check AIC today again stressed imp of wt loss for this and low sugar diet  Her updated medication list for this problem includes:    Adult Aspirin Ec Low Strength 81 Mg Tbec (Aspirin) .Marland Kitchen... Take 1 tablet by mouth once a day  Orders: Venipuncture (75643) TLB-Lipid Panel (80061-LIPID) TLB-Renal Function Panel (80069-RENAL) TLB-ALT (SGPT) (84460-ALT) TLB-AST (SGOT) (84450-SGOT) TLB-A1C / Hgb A1C (Glycohemoglobin) (83036-A1C) EKG w/ Interpretation (93000)  Problem # 5:  SLEEP APNEA (ICD-780.57) Assessment: Unchanged pt never followed up with cpap with this - needs re eval before upcoming surgery  is overwt with fatigue and and sleeps a lot during the day Orders: Pulmonary Referral  (Pulmonary)  Complete Medication List: 1)  Toprol Xl 100 Mg Tb24 (Metoprolol succinate) .Marland Kitchen.. 1 by mouth daily 2)  Adult Aspirin Ec Low Strength 81 Mg Tbec (Aspirin) .... Take 1 tablet by mouth once a day 3)  Furosemide 80 Mg Tabs (Furosemide) .... Take 1 tablet by mouth once a day 4)  Klor-con M20 20 Meq Tbcr (Potassium chloride crys cr) .... Take 1 tablet by mouth two times a day 5)  Balanced Care Tabs (Multiple vitamins-minerals) .... Take 1 tablet by mouth once a day 6)  Calcium 600 Tabs (Calcium carbonate tabs) .... Take 1 tablet by mouth two times a day 7)  Albertsons Natural Magnesium Tabs (Magnesium tabs) .... Take 1 tablet by mouth once a day 8)  Glucometer  .... To check glucose once daily and as needed for dm 250.0 9)  Accu Check Aviva Strips and Softclix Lancets  .... Use to check blood sugar once daily and as needed for dm 10)  Fish Oil 1000 Mg Caps (Omega-3 fatty acids) .Marland Kitchen.. 1 by mouth once daily 11)  Hydrochlorothiazide 12.5 Mg Caps (Hydrochlorothiazide) .Marland Kitchen.. 1 by mouth daily  Patient Instructions: 1)  we will do pulmonary referral for sleep apnea at check out  2)  I will hold on to paperwork for knee surgery until that is done  3)  labs today  4)  swelling looks better 5)  remember that aleve and other anti- inflammatories cause leg swelling -- so use only when necessary  6)  blood pressure is ok   Current Allergies (reviewed today): ! BUSPAR ! * VIT C LISINOPRIL (LISINOPRIL)    EKG  Procedure date:  06/16/2009  Findings:      NSR with rate of 77 and few PACs  no acute changes

## 2010-03-25 NOTE — Miscellaneous (Signed)
Summary: refil testing supplies  Clinical Lists Changes  Medications: Changed medication from * GLUCOSE TEST STRIPS AND LANCETS to check glucose once daily and as needed for DM 250.0 to * ACCU CHECK AVIVA STRIPS AND SOFTCLIX LANCETS to check glucose with meals and at bedtime for DM2 250.0 - Signed Rx of ACCU CHECK AVIVA STRIPS AND SOFTCLIX LANCETS to check glucose with meals and at bedtime for DM2 250.0;  #100 each x 3;  Signed;  Entered by: Judith Part MD;  Authorized by: Judith Part MD;  Method used: Printed then faxed to    Prescriptions: ACCU CHECK AVIVA STRIPS AND SOFTCLIX LANCETS to check glucose with meals and at bedtime for DM2 250.0  #100 each x 3   Entered and Authorized by:   Judith Part MD   Signed by:   Judith Part MD on 05/08/2008   Method used:   Printed then faxed to ...         RxID:   [id number]

## 2010-03-25 NOTE — Assessment & Plan Note (Signed)
Summary: 3 m f/u dlo   Vital Signs:  Patient profile:   75 year old female Height:      66.25 inches Weight:      251 pounds BMI:     40.35 Temp:     98.1 degrees F oral Pulse rate:   68 / minute Pulse rhythm:   regular BP sitting:   130 / 70  (left arm) Cuff size:   large  Vitals Entered By: Liane Comber (June 02, 2008 1:58 PM)  Serial Vital Signs/Assessments:  Time      Position  BP       Pulse  Resp  Temp     By                     122/80                         Judith Part MD  Comments: large cuff  By: Judith Part MD    History of Present Illness: wt is coming down- is really working on it  slipped up a little on vacation is doing water aerobics regularly  overall doing very well and learned a lot at the diabetic clinic   has not been checking bp cuff at home  thinks her pressure is coming down   really enjoys better diet - new things to try and cook  helps a lot to keep food log using food scale   is checking sugar once daily - some am and some in evening Ms are 90s to low 100 s evens rarely above low 100s - occ 130s   lots of stress caring for mother and brother (he has hep C and alcoholism)  last opthy was 1 y ago   does not want   Allergies: 1)  ! Buspar 2)  Lisinopril (Lisinopril)  Past History:  Past Medical History:    Allergic rhinitis    Anxiety    Depression    Hyperlipidemia    Hypertension    obesity    compulsive overeating     OA knees with injections     DM2 (02/27/2008)  Past Surgical History:    Hysterectomy- partial, fibroids    Lumpectomy, right x1 (1989),   left x 2 (1970's, 1990)    Colonoscopy- diverticulosis (12/2000)    Pelvic US (2000, 2001)    Dexa- normal (04/2000)    Cardiac cath- normal per pt (2001)    Sleep study (06/2003)    Colonoscopy- diverticulosis, hemorrhoids (10/2004)    Adenosine cardiolite- low risk (02/2006)     (12/21/2006)  Family History:    Father: died age 12- CVA  Mother: HTN (has a lot of animosity in her relationship with her mother)    Siblings: 2 brothers- 1, prostate cancer. 1, Hep C. 1 sister, breast cancer    brother is addicted to drugs      (10/10/2007)  Social History:    Marital Status: widow    Children: 1 daughter    Occupation: retired principal    non smoker    no alcohol      (09/06/2007)  Family History:    Father: died age 48- CVA    Mother: HTN (has a lot of animosity in her relationship with her mother)    Siblings: 2 brothers- 1, prostate cancer. 1, Hep C. 1 sister, breast cancer    brother is addicted to drugs  brother with hep C and alcoholism (terminal)      Social History:    Marital Status: widow    Children: 1 daughter    Occupation: retired principal    non smoker    no alcohol     has to take care of sick family members   Review of Systems General:  Denies fever, loss of appetite, and malaise. Eyes:  Denies blurring and eye irritation. CV:  Denies chest pain or discomfort, palpitations, shortness of breath with exertion, swelling of feet, and weight gain; feet swelling is much better. Resp:  Denies cough and wheezing. GI:  Denies abdominal pain, bloody stools, change in bowel habits, indigestion, and nausea. Derm:  Denies poor wound healing and rash. Neuro:  Denies numbness and tingling. Psych:  mood is better . Endo:  Denies excessive thirst and excessive urination.  Physical Exam  General:  overweight but generally well appearing  Head:  normocephalic, atraumatic, and no abnormalities observed.   Mouth:  pharynx pink and moist.   Neck:  supple with full rom and no masses or thyromegally, no JVD or carotid bruit  Lungs:  Normal respiratory effort, chest expands symmetrically. Lungs are clear to auscultation, no crackles or wheezes. Heart:  Normal rate and regular rhythm. S1 and S2 normal without gallop, murmur, click, rub or other extra sounds. Pulses:  R and L carotid,radial,femoral,dorsalis  pedis and posterior tibial pulses are full and equal bilaterally Extremities:  No clubbing, cyanosis, edema, or deformity noted with normal full range of motion of all joints.   Neurologic:  sensation intact to light touch, gait normal, and DTRs symmetrical and normal.   Skin:  Intact without suspicious lesions or rashes Cervical Nodes:  No lymphadenopathy noted Psych:  normal affect, talkative and pleasant   Diabetes Management Exam:    Foot Exam (with socks and/or shoes not present):       Sensory-Pinprick/Light touch:          Left medial foot (L-4): normal          Left dorsal foot (L-5): normal          Left lateral foot (S-1): normal          Right medial foot (L-4): normal          Right dorsal foot (L-5): normal          Right lateral foot (S-1): normal       Sensory-Monofilament:          Left foot: normal          Right foot: normal       Inspection:          Left foot: normal          Right foot: normal       Nails:          Left foot: normal          Right foot: normal    Eye Exam:       Eye Exam done elsewhere          Date: 06/03/2007          Results: normal          Done by: ??   Impression & Recommendations:  Problem # 1:  DIABETES MELLITUS, TYPE II, MILD (ICD-250.00) Assessment Improved  overall doing very well after DM lifestyle classes with diet/exercise/wt loss and sugar control urged to keep up good work  pt will make own eye  appt- is due for annual exam good foot care  pt refuses pneumonia vaccine at this time labs today- and f/u about 3 mo Her updated medication list for this problem includes:    Adult Aspirin Ec Low Strength 81 Mg Tbec (Aspirin) .Marland Kitchen... Take 1 tablet by mouth once a day  Orders: Venipuncture (04540) TLB-Lipid Panel (80061-LIPID) TLB-Renal Function Panel (80069-RENAL) TLB-ALT (SGPT) (84460-ALT) TLB-A1C / Hgb A1C (Glycohemoglobin) (83036-A1C)  Labs Reviewed: Creat: 1.1 (10/05/2007)    Reviewed HgBA1c results: 6.2  (12/13/2007)  Problem # 2:  HYPERTENSION (ICD-401.9) Assessment: Improved  blood pressure in good control currently- this is imp with wt loss and better habits  pt does plan to obtain home cuff  non tol ace need to consider ARB for renal prot in future if pt remains DM for this time - continue to monitor  labs today Her updated medication list for this problem includes:    Toprol Xl 100 Mg Tb24 (Metoprolol succinate) .Marland Kitchen... 1 by mouth daily    Furosemide 80 Mg Tabs (Furosemide) .Marland Kitchen... Take 1 tablet by mouth once a day    Cardizem Cd 360 Mg Cp24 (Diltiazem hcl coated beads) .Marland Kitchen... Take 1 capsule by mouth once a day  Orders: Venipuncture (98119) TLB-Lipid Panel (80061-LIPID) TLB-Renal Function Panel (80069-RENAL) TLB-ALT (SGPT) (84460-ALT)  BP today: 130/70 Prior BP: 122/90 (03/20/2008)  Labs Reviewed: K+: 4.0 (10/05/2007) Creat: : 1.1 (10/05/2007)   Chol: 194 (12/13/2007)   HDL: 46.1 (12/13/2007)   LDL: 134 (12/13/2007)   TG: 70 (12/13/2007)  Problem # 3:  HYPERLIPIDEMIA (ICD-272.4) Assessment: Unchanged  hopeful for imp today with better diet and exercise  disc HDL and LDL goals for pt with DM- pt continues to be hesitant to use statin or other meds labs today rev further at 38mo f/u urged to continue good efforts with lifestyle Orders: Venipuncture (14782) TLB-Lipid Panel (80061-LIPID) TLB-Renal Function Panel (80069-RENAL) TLB-ALT (SGPT) (84460-ALT) TLB-AST (SGOT) (84450-SGOT)  Labs Reviewed: SGOT: 26 (12/13/2007)   SGPT: 28 (12/13/2007)   HDL:46.1 (12/13/2007), 43.6 (10/05/2007)  LDL:134 (12/13/2007), DEL (10/05/2007)  Chol:194 (12/13/2007), 202 (10/05/2007)  Trig:70 (12/13/2007), 94 (10/05/2007)  Problem # 4:  Hx of OVEREATING (ICD-783.6) seems to be in better control of this with diet ed commended efforts   Complete Medication List: 1)  Toprol Xl 100 Mg Tb24 (Metoprolol succinate) .Marland Kitchen.. 1 by mouth daily 2)  Adult Aspirin Ec Low Strength 81 Mg Tbec (Aspirin) ....  Take 1 tablet by mouth once a day 3)  Furosemide 80 Mg Tabs (Furosemide) .... Take 1 tablet by mouth once a day 4)  Cardizem Cd 360 Mg Cp24 (Diltiazem hcl coated beads) .... Take 1 capsule by mouth once a day 5)  Klor-con M20 20 Meq Tbcr (Potassium chloride crys cr) .... Take 1 tablet by mouth two times a day 6)  Alprazolam 0.25 Mg Tabs (Alprazolam) .... Take 1 tablet by mouth once daily as needed severe anxiety 7)  Balanced Care Tabs (Multiple vitamins-minerals) .... Take 1 tablet by mouth once a day 8)  Calcium 600 Tabs (Calcium carbonate tabs) .... Take 1 tablet by mouth two times a day 9)  [employer] Natural Magnesium Tabs (Magnesium tabs) .... Take 1 tablet by mouth once a day 10)  Glucometer  .... To check glucose once daily and as needed for dm 250.0 11)  Accu Check Aviva Strips and Softclix Lancets  .... Use to check blood sugar once daily and as needed for dm 12)  Aleve 220 Mg Tabs (Naproxen sodium) .Marland KitchenMarland KitchenMarland Kitchen  1-2 by mouth two times a day as needed gout -- do not use for more than 2-3 days  Patient Instructions: 1)  do not forget to get your eye exam  2)  checking labs including cholesterol and AIC  3)  keep up the great diet and exercise 4)  start checking blood pressure when you can - it is good today 5)  follow up in about 3 months       Prior Medications (reviewed today): TOPROL XL 100 MG  TB24 (METOPROLOL SUCCINATE) 1 by mouth daily ADULT ASPIRIN EC LOW STRENGTH 81 MG  TBEC (ASPIRIN) Take 1 tablet by mouth once a day FUROSEMIDE 80 MG  TABS (FUROSEMIDE) Take 1 tablet by mouth once a day CARDIZEM CD 360 MG  CP24 (DILTIAZEM HCL COATED BEADS) Take 1 capsule by mouth once a day KLOR-CON M20 20 MEQ  TBCR (POTASSIUM CHLORIDE CRYS CR) Take 1 tablet by mouth two times a day ALPRAZOLAM 0.25 MG  TABS (ALPRAZOLAM) Take 1 tablet by mouth once daily as needed severe anxiety BALANCED CARE   TABS (MULTIPLE VITAMINS-MINERALS) Take 1 tablet by mouth once a day CALCIUM 600   TABS (CALCIUM  CARBONATE TABS) Take 1 tablet by mouth two times a day ALBERTSONS NATURAL MAGNESIUM   TABS (MAGNESIUM TABS) Take 1 tablet by mouth once a day GLUCOMETER () to check glucose once daily and as needed for DM 250.0 ALEVE 220 MG TABS (NAPROXEN SODIUM) 1-2 by mouth two times a day as needed gout -- do not use for more than 2-3 days Current Allergies (reviewed today): ! BUSPAR LISINOPRIL (LISINOPRIL) Current Medications (including changes made in today's visit):  TOPROL XL 100 MG  TB24 (METOPROLOL SUCCINATE) 1 by mouth daily ADULT ASPIRIN EC LOW STRENGTH 81 MG  TBEC (ASPIRIN) Take 1 tablet by mouth once a day FUROSEMIDE 80 MG  TABS (FUROSEMIDE) Take 1 tablet by mouth once a day CARDIZEM CD 360 MG  CP24 (DILTIAZEM HCL COATED BEADS) Take 1 capsule by mouth once a day KLOR-CON M20 20 MEQ  TBCR (POTASSIUM CHLORIDE CRYS CR) Take 1 tablet by mouth two times a day ALPRAZOLAM 0.25 MG  TABS (ALPRAZOLAM) Take 1 tablet by mouth once daily as needed severe anxiety BALANCED CARE   TABS (MULTIPLE VITAMINS-MINERALS) Take 1 tablet by mouth once a day CALCIUM 600   TABS (CALCIUM CARBONATE TABS) Take 1 tablet by mouth two times a day ALBERTSONS NATURAL MAGNESIUM   TABS (MAGNESIUM TABS) Take 1 tablet by mouth once a day * GLUCOMETER to check glucose once daily and as needed for DM 250.0 * ACCU CHECK AVIVA STRIPS AND SOFTCLIX LANCETS use to check blood sugar once daily and as needed for DM ALEVE 220 MG TABS (NAPROXEN SODIUM) 1-2 by mouth two times a day as needed gout -- do not use for more than 2-3 days

## 2010-03-25 NOTE — Letter (Signed)
Summary: Medicare/Medicaid Imaging Options Form/[place]  Medicare/Medicaid Imaging Options Form/[place]   Imported By: Lanelle Bal 10/09/2008 15:05:48  _____________________________________________________________________  External Attachment:    Type:   Image     Comment:   External Document

## 2010-03-25 NOTE — Progress Notes (Signed)
Summary: pt thinks med is causing muscle cramps  Phone Note Call from Patient Call back at Home Phone [phone]   Call For: Judith Part MD Summary of Call: Patient says that she is taking Zocor 20mg  tablets (simvastatin) and she noticed that it says to report any cramping or pain of the muscles. She said that she has been experiencing some cramping and is wondering if it is from medication. Initial call taken by: Melody Comas,  January 02, 2009 3:52 PM  Follow-up for Phone Call        stop the medicine for 1 week and then call back and update me with how cramps are  Follow-up by: Judith Part MD,  January 02, 2009 5:00 PM  Additional Follow-up for Phone Call Additional follow up Details #1::        Patient notified as instructed by telephone.  Additional Follow-up by: Lewanda Rife LPN,  January 05, 2009 12:00 PM

## 2010-03-25 NOTE — Progress Notes (Signed)
Summary: pt has gout  Phone Note Call from Patient Call back at Home Phone [phone]   Caller: Patient Call For: Monica [name] Summary of Call: Pt has gout flare up in left foot, since friday,  and is asking if something can be called to [facility]. Initial call taken by: Lowella Petties CMA,  October 12, 2009 8:40 AM  Follow-up for Phone Call        f/u if not imp px written on EMR for call in -- colcrys (warn this can give GI side eff- take with food) Follow-up by: Monica [name],  October 12, 2009 10:01 AM  Additional Follow-up for Phone Call Additional follow up Details #1::        Patient notified as instructed by telephone. Medication phoned to [facility] pharmacy as instructed. Lewanda Rife LPN  October 12, 2009 10:51 AM     New/Updated Medications: COLCRYS 0.6 MG TABS (COLCHICINE) 1 by mouth two times a day as needed gout- take with food Prescriptions: COLCRYS 0.6 MG TABS (COLCHICINE) 1 by mouth two times a day as needed gout- take with food  #15 x 1   Entered and Authorized by:   Monica [name]   Signed by:   Monica [name] on 10/12/2009   Method used:   Telephoned to ...       [facility]* (retail)       [address]       [place], [place]  [address]       Ph: [phone]       Fax: [phone]   RxID:   [id number]

## 2010-03-25 NOTE — Progress Notes (Signed)
Summary: wants medicine for gout  Phone Note Call from Patient Call back at Home Phone [phone]   Caller: Patient Call For: Judith Part MD Summary of Call: Pt states she is having a bout with gout in both big toes and is ready to try a medicine that the two of you had discussed at her last visit.  [facility]ses [facility].  She has colchicine, so doesnt need that. Initial call taken by: Lowella Petties CMA, AAMA,  December 30, 2009 4:42 PM  Follow-up for Phone Call        allopurinol is the prophylactic med for gout we discussed - and should not be started during an acute episode (can make it worse) go ahead and take the colchicine until this episode simmers down  call back when symptoms are improved so we can get her started on it  Follow-up by: Judith Part MD,  December 30, 2009 5:03 PM  Additional Follow-up for Phone Call Additional follow up Details #1::        Patient notified as instructed by telephone. Lewanda Rife LPN  December 30, 2009 5:33 PM

## 2010-03-25 NOTE — Consult Note (Signed)
Summary: DR. - breast check  DR. [facility]   Imported By: Carin Primrose 10/24/2007 16:23:38  _____________________________________________________________________  External Attachment:    Type:   Image     Comment:   External Document

## 2010-03-25 NOTE — Assessment & Plan Note (Signed)
Summary: FOOT PAIN/HEA   Vital Signs:  Patient Profile:   75 Years Old Female Weight:      260 pounds Temp:     97.9 degrees F oral Pulse rate:   68 / minute Pulse rhythm:   regular BP sitting:   148 / 82  (left arm) Cuff size:   large  Vitals Entered By: Lowella Petties (February 23, 2007 11:21 AM)                 Chief Complaint:  Right foot pain.  History of Present Illness: problems with R foot - had touch of athlete's foot and was tx her with vinigar, then used some poly sporin  now foot hurts- over the top of it it burns on the top- varies from tingling to deep ache sometimes worse with walking saw the orthopedic for leg and buttock pain on R- declined injection becuase of bad experience in past  no trauma to the foot feels occ numbness in toes foot  feels like something fell on it- but it didn't- similar to when her other foot was broken  Current Allergies: ! * SHRIMP     Review of Systems      See HPI  General      Denies chills, fever, and loss of appetite.  Resp      Denies shortness of breath.  MS      Complains of joint pain, low back pain, and stiffness.      Denies joint redness and joint swelling.  Derm      Complains of itching.      Denies changes in color of skin.  Neuro      Complains of numbness.      Denies weakness.  Psych      mood has been fair still dealing with emotional eating issues   Physical Exam  General:     overweight but generally well appearing  Mouth:     MMM, mouth clear Neck:     No deformities, masses, or tenderness noted. Lungs:     Normal respiratory effort, chest expands symmetrically. Lungs are clear to auscultation, no crackles or wheezes. Heart:     Normal rate and regular rhythm. S1 and S2 normal without gallop, murmur, click, rub or other extra sounds. Msk:     tenderness over distal 2nd, 3rd, 4th metatarsals no focal swelling/ecchymosis nl rom toes- some pain on dorsiflex of ankle, can  plantarflex and invert/evert foot without discomfort Pulses:     R and L carotid,radial,femoral,dorsalis pedis and posterior tibial pulses are full and equal bilaterally Extremities:     No clubbing, cyanosis, edema, or deformity noted with normal full range of motion of all joints.   Neurologic:     sensation intact to light touch.  - nl monofilament test Skin:     scale and macerated skin between 3rd/4th/5th toes some excoriation without signs of bact infection  Cervical Nodes:     No lymphadenopathy noted Psych:     slt anxious, pleasant    Impression & Recommendations:  Problem # 1:  TINEA PEDIS (ICD-110.4) ongoing with some skin maceration will tx with ketoconazole cream and adv to keep area clean and dry will update if not imp Her updated medication list for this problem includes:    Ketoconazole 2 % Crea (Ketoconazole) .Marland Kitchen... Apply twice daily to affected area   Problem # 2:  FOOT PAIN, RIGHT (ICD-729.5) will get foot x ray and update  differential includes stress fx, neuroma and radiculopathy Orders: Diagnostic X-Ray/Fluoroscopy (Diagnostic X-Ray/Flu)   Complete Medication List: 1)  Toprol Xl 100 Mg Tb24 (Metoprolol succinate) .Marland Kitchen.. 1 by mouth qd 2)  Adult Aspirin Ec Low Strength 81 Mg Tbec (Aspirin) .... Take 1 tablet by mouth once a day 3)  Furosemide 80 Mg Tabs (Furosemide) .... Take 1 tablet by mouth once a day 4)  Cardizem Cd 360 Mg Cp24 (Diltiazem hcl coated beads) .... Take 1 capsule by mouth once a day 5)  Klor-con M20 20 Meq Tbcr (Potassium chloride crys cr) .... Take 1 tablet by mouth once a day 6)  Alprazolam 0.25 Mg Tabs (Alprazolam) .... Take 1 tablet by mouth two times a day 7)  Balanced Care Tabs (Multiple vitamins-minerals) .... Take 1 tablet by mouth once a day 8)  Calcium 600 Tabs (Calcium carbonate tabs) .... Take 1 tablet by mouth once a day 9)  Albertsons Natural Magnesium Tabs (Magnesium tabs) .... Take 1 tablet by mouth once a day 10)   Ketoconazole 2 % Crea (Ketoconazole) .... Apply twice daily to affected area   Patient Instructions: 1)  use the ketoconazole cream as directed- try to keep foot as clean and dry as possible 2)  if not improved in 2 weeks- please call 3)  we will schedule x ray of your foot at check out- and make further plan based on the result    Prescriptions: KETOCONAZOLE 2 %  CREA (KETOCONAZOLE) apply twice daily to affected area  #1 med x 1   Entered and Authorized by:   Judith Part MD   Signed by:   Judith Part MD on 02/23/2007   Method used:   Print then Give to Patient   RxID:   [id number]  ]

## 2010-03-25 NOTE — Assessment & Plan Note (Signed)
Summary: MONDAY FOLLOW UP/[facility]   Vital Signs:  Patient profile:   75 year old female Weight:      256 pounds Temp:     98.5 degrees F oral Pulse rate:   72 / minute Pulse rhythm:   regular BP sitting:   124 / 72  (left arm) Cuff size:   large  Vitals Entered By: Lowella Petties CMA (March 10, 2009 3:53 PM) CC: Follow up from last week   History of Present Illness: here for f/u of edema  added zaroxolyn -- for swelling and high bp  that really helped  did lost 5 lb at least -- can get jewelry back on   bp is much better now   is avoiding salt - doing much better with that  avoiding processed foods   feels like she needs something for anxiety  tried buspar - ? if made her sleepy  is more anxious- sometimes down - not severe and no suicidal ideation   she thinks her stomach tends to swelling  may be interested in GI visit for this  ? has seen Dr Elsie Amis in the past      Allergies: 1)  ! Buspar 2)  Lisinopril (Lisinopril)  Past History:  Past Medical History: Last updated: 02/27/2008 Allergic rhinitis Anxiety Depression Hyperlipidemia Hypertension obesity compulsive overeating  OA knees with injections  DM2  Past Surgical History: Last updated: 12/21/2006 Hysterectomy- partial, fibroids Lumpectomy, right x1 (1989),   left x 2 (1970's, 1990) Colonoscopy- diverticulosis (12/2000) Pelvic US (2000, 2001) Dexa- normal (04/2000) Cardiac cath- normal per pt (2001) Sleep study (06/2003) Colonoscopy- diverticulosis, hemorrhoids (10/2004) Adenosine cardiolite- low risk (02/2006)  Family History: Last updated: 06-22-08 Father: died age 72- CVA Mother: HTN (has a lot of animosity in her relationship with her mother) Siblings: 2 brothers- 1, prostate cancer. 1, Hep C. 1 sister, breast cancer brother is addicted to drugs  brother with hep C and alcoholism (terminal)  Social History: Last updated: 22-Jun-2008 Marital Status: widow Children: 1  daughter Occupation: retired principal non smoker no alcohol  has to take care of sick family members   Risk Factors: Smoking Status: quit (12/21/2006)  Review of Systems General:  Denies fatigue, loss of appetite, and malaise. Eyes:  Denies blurring and eye pain. CV:  Denies chest pain or discomfort, lightheadness, and palpitations. Resp:  Denies cough, shortness of breath, and wheezing. GI:  Complains of gas; denies abdominal pain, change in bowel habits, nausea, and vomiting. GU:  Denies dysuria. MS:  Complains of joint pain and stiffness; denies muscle aches. Derm:  Denies itching, lesion(s), poor wound healing, and rash. Neuro:  Denies numbness and tingling. Psych:  Denies anxiety and depression. Endo:  Denies excessive thirst and excessive urination. Heme:  Denies abnormal bruising and bleeding.  Physical Exam  General:  obese and well appearing  Head:  normocephalic, atraumatic, and no abnormalities observed.   Eyes:  vision grossly intact, pupils equal, pupils round, and pupils reactive to light.   Mouth:  pharynx pink and moist.   Neck:  supple with full rom and no masses or thyromegally, no JVD or carotid bruit  Lungs:  Normal respiratory effort, chest expands symmetrically. Lungs are clear to auscultation, no crackles or wheezes. Heart:  Normal rate and regular rhythm. S1 and S2 normal without gallop, murmur, click, rub or other extra sounds. Abdomen:  soft, non-tender, and normal bowel sounds.  no renal bruits  Msk:  No deformity or scoliosis noted of thoracic  or lumbar spine.   Pulses:  R and L carotid,radial,femoral,dorsalis pedis and posterior tibial pulses are full and equal bilaterally Extremities:  trace edema bilat- much improved Neurologic:  sensation intact to light touch, gait normal, and DTRs symmetrical and normal.   Skin:  Intact without suspicious lesions or rashes Cervical Nodes:  No lymphadenopathy noted Inguinal Nodes:  No significant  adenopathy Psych:  mildly anxious  Diabetes Management Exam:    Foot Exam (with socks and/or shoes not present):       Sensory-Pinprick/Light touch:          Left medial foot (L-4): normal          Left dorsal foot (L-5): normal          Left lateral foot (S-1): normal          Right medial foot (L-4): normal          Right dorsal foot (L-5): normal          Right lateral foot (S-1): normal       Sensory-Monofilament:          Left foot: normal          Right foot: normal       Inspection:          Left foot: normal          Right foot: normal       Nails:          Left foot: normal          Right foot: normal   Impression & Recommendations:  Problem # 1:  EDEMA (ICD-782.3) Assessment Improved much imp with 4 days of zaroxolyn-- is adv to stop it now disc imp of good water intake and physical activiy/ leg elevation when resting  sodium avoidance disc in detail  will f/u 3 mo  The following medications were removed from the medication list:    Zaroxolyn 5 Mg Tabs (Metolazone) .Marland Kitchen... 1 by mouth once daily in am Her updated medication list for this problem includes:    Furosemide 80 Mg Tabs (Furosemide) .Marland Kitchen... Take 1 tablet by mouth once a day  Problem # 2:  HYPERTENSION (ICD-401.9) Assessment: Improved much imp after zaroxolyn and decrease fluid retention will watch carefully - at home  avoid sodium  labs reviewed  f/u 3 mo or earlier if needed  The following medications were removed from the medication list:    Zaroxolyn 5 Mg Tabs (Metolazone) .Marland Kitchen... 1 by mouth once daily in am Her updated medication list for this problem includes:    Toprol Xl 100 Mg Tb24 (Metoprolol succinate) .Marland Kitchen... 1 by mouth daily    Furosemide 80 Mg Tabs (Furosemide) .Marland Kitchen... Take 1 tablet by mouth once a day    Cardizem Cd 360 Mg Cp24 (Diltiazem hcl coated beads) .Marland Kitchen... Take 1 capsule by mouth once a day  Problem # 3:  DIABETES MELLITUS, TYPE II, MILD (ICD-250.00) Assessment: Unchanged pt is  interested in nutritionist consult - she will call ins to see how coverage is  disc healthy diet (low simple sugar/ choose complex carbs/ low sat fat) diet and exercise in detail  needs wt loss- pt understands  f/u 3 mo  Her updated medication list for this problem includes:    Adult Aspirin Ec Low Strength 81 Mg Tbec (Aspirin) .Marland Kitchen... Take 1 tablet by mouth once a day  Problem # 4:  ANXIETY (ICD-300.00) Assessment: Deteriorated worse lately will try buspar again and update  disc poss side eff in detail - incl sedation  enc her to return to counseling  Her updated medication list for this problem includes:    Buspirone Hcl 15 Mg Tabs (Buspirone hcl) .Marland Kitchen... 1/2 by mouth two times a day for anxiety  Complete Medication List: 1)  Toprol Xl 100 Mg Tb24 (Metoprolol succinate) .Marland Kitchen.. 1 by mouth daily 2)  Adult Aspirin Ec Low Strength 81 Mg Tbec (Aspirin) .... Take 1 tablet by mouth once a day 3)  Furosemide 80 Mg Tabs (Furosemide) .... Take 1 tablet by mouth once a day 4)  Cardizem Cd 360 Mg Cp24 (Diltiazem hcl coated beads) .... Take 1 capsule by mouth once a day 5)  Klor-con M20 20 Meq Tbcr (Potassium chloride crys cr) .... Take 1 tablet by mouth two times a day 6)  Balanced Care Tabs (Multiple vitamins-minerals) .... Take 1 tablet by mouth once a day 7)  Calcium 600 Tabs (Calcium carbonate tabs) .... Take 1 tablet by mouth two times a day 8)  Albertsons Natural Magnesium Tabs (Magnesium tabs) .... Take 1 tablet by mouth once a day 9)  Glucometer  .... To check glucose once daily and as needed for dm 250.0 10)  Accu Check Aviva Strips and Softclix Lancets  .... Use to check blood sugar once daily and as needed for dm 11)  Cyclobenzaprine Hcl 10 Mg Tabs (Cyclobenzaprine hcl) .... Take one by mouth every 8 hours as needed for neck pain 12)  Fish Oil 1000 Mg Caps (Omega-3 fatty acids) .Marland Kitchen.. 1 by mouth once daily 13)  Buspirone Hcl 15 Mg Tabs (Buspirone hcl) .... 1/2 by mouth two times a day for  anxiety  Patient Instructions: 1)  stop the zaroxolyn  2)  start back with buspar 1/2 pill two times a day -- for anxiety and depression  3)  for gas and bloating - can try beano regularly  4)  let me know if you want to see GI for stomach problems  in the future  5)  stay away from dairy or take lactaid 6)  call your insurance about coverage for nutritionist -- and let me know if you want a referral  7)  follow up with me in 3 months  Prescriptions: BUSPIRONE HCL 15 MG TABS (BUSPIRONE HCL) 1/2 by mouth two times a day for anxiety  #30 x 5   Entered and Authorized by:   Judith Part MD   Signed by:   Judith Part MD on 03/10/2009   Method used:   Print then Give to Patient   RxID:   [id number]

## 2010-03-25 NOTE — Letter (Signed)
Summary: Generic Letter  [place] at [facility]  [address] [place], [place] [address]   Phone: [phone]  Fax: [phone]    10/29/2008    MAEVIS MUMBY [address] [place], [place]  [address]    Dear Ms. MERRIMAN,   We have tried to reach you on several occasions by phone.  Please call in to our office at your earliest convenience so that we may provide some instructions for you.  [phone].  Please ask to speak to [facility].    Sincerely,   Marne A. Milinda Antis, M.D.  MAT:lsf

## 2010-03-25 NOTE — Miscellaneous (Signed)
Summary: med refill  Clinical Lists Changes  Medications: Rx of TOPROL XL 100 MG  TB24 (METOPROLOL SUCCINATE) at bedtime;  #30 x 2;  Signed;  Entered by: Lowella Petties;  Authorized by: Lowella Petties;  Method used: Telephoned to [facility]*, [address], [place], [place], [place]  [address], Ph: [phone] or [phone], Fax: [phone]    Prescriptions: TOPROL XL 100 MG  TB24 (METOPROLOL SUCCINATE) at bedtime  #30 x 2   Entered and Authorized by:   Lowella Petties   Signed by:   Lowella Petties on 11/15/2006   Method used:   Telephoned to ...       [facility]*       [address]       [place], [place]  [address]       Ph: [phone] or [phone]       Fax: [phone]   RxID:   [id number]    Prior Medications: TOPROL XL 100 MG  TB24 (METOPROLOL SUCCINATE) at bedtime ADULT ASPIRIN EC LOW STRENGTH 81 MG  TBEC (ASPIRIN) Take 1 tablet by mouth once a day FUROSEMIDE 80 MG  TABS (FUROSEMIDE) Take 1 tablet by mouth once a day CARDIZEM CD 360 MG  CP24 (DILTIAZEM HCL COATED BEADS) Take 1 capsule by mouth once a day KLOR-CON M20 20 MEQ  TBCR (POTASSIUM CHLORIDE CRYS CR) Take 1 tablet by mouth once a day ALPRAZOLAM 0.25 MG  TABS (ALPRAZOLAM) Take 1 tablet by mouth two times a day BALANCED CARE   TABS (MULTIPLE VITAMINS-MINERALS) Take 1 tablet by mouth once a day CALCIUM 600   TABS (CALCIUM CARBONATE TABS) Take 1 tablet by mouth once a day ALBERTSONS NATURAL MAGNESIUM   TABS (MAGNESIUM TABS) Take 1 tablet by mouth once a day Current Allergies: ! * SHRIMP

## 2010-03-25 NOTE — Consult Note (Signed)
Summary: [facility]/Consultation Report/[facility]. [facility]/Consultation Report/Dr. Evette Cristal   Imported By: Mickle Asper 01/25/2007 14:26:55  _____________________________________________________________________  External Attachment:    Type:   Image     Comment:   External Document

## 2010-03-25 NOTE — Assessment & Plan Note (Signed)
Summary: THIGH PAIN/CLE   Vital Signs:  Patient Profile:   75 Years Old Female Weight:      255 pounds Temp:     98.1 degrees F oral Pulse rate:   76 / minute Pulse rhythm:   regular BP sitting:   144 / 90  (left arm) Cuff size:   large  Vitals Entered By: Lowella Petties (December 21, 2006 11:07 AM)                 Chief Complaint:  Right thigh pain.  History of Present Illness: had some x rays this summer that showed some mild deg change in hips, and deg disk at L5-S1 she has a pain in her R buttock and also pain that radiates to top of her R foot bending foward and sitting makes it worse- pulls and tightness has had knot in her buttock for years- now feels like a rock no trauma no bruising no numbness, ? if weakness of leg- it gives out easily (but also has arthritis in knees) medicines do not help- tylenol does nothing aleve helps a little, and advil helps a little  had mam and [place]- told to return in 6 month sister had breast cancer and 2 cousins had breast cancer  Current Allergies: ! * SHRIMP     Review of Systems      See HPI  General      Denies chills, fever, and weight loss.  CV      Denies chest pain or discomfort.  Resp      no more sob than usual  GU      Denies incontinence.  MS      Complains of joint pain and stiffness.      Denies joint redness and joint swelling.  Derm      Denies changes in color of skin and rash.  Psych      mood is ok   Physical Exam  General:     overweight but generally well appearing  Head:     Normocephalic and atraumatic without obvious abnormalities. No apparent alopecia or balding. Eyes:     vision grossly intact, pupils equal, pupils round, and pupils reactive to light.   Neck:     nl rom Lungs:     Normal respiratory effort, chest expands symmetrically. Lungs are clear to auscultation, no crackles or wheezes. Heart:     Normal rate and regular rhythm. S1 and S2 normal without gallop, murmur,  click, rub or other extra sounds. Msk:     some R para spinal muscular/SI tenderness pos SLR for leg and foot pain nl rom of hip with some discomfort no spinous process tenderness no leg tenderness or palp cords foot is somewhat tender/touchy all over Pulses:     R and L carotid,radial,femoral,dorsalis pedis and posterior tibial pulses are full and equal bilaterally Extremities:     No clubbing, cyanosis, edema, or deformity noted with normal full range of motion of all joints.   Neurologic:     sensation intact to light touch, gait normal, and DTRs symmetrical and normal.   Skin:     Intact without suspicious lesions or rashes Cervical Nodes:     No lymphadenopathy noted Inguinal Nodes:     No significant adenopathy Psych:     nl affect, pt is generally uncomfortable    Impression & Recommendations:  Problem # 1:  LEG PAIN (ICD-729.5) with features of radiculopathy from deg disk ? if also  contrib knee OA will schedule LS MRI and advise Orders: MRI (MRI)   Problem # 2:  SCIATICA (ICD-724.3) see above- will check MRI Her updated medication list for this problem includes:    Adult Aspirin Ec Low Strength 81 Mg Tbec (Aspirin) .Marland Kitchen... Take 1 tablet by mouth once a day  Orders: MRI (MRI)   Problem # 3:  HYPERTENSION (ICD-401.9) blood pressure borderline today- will continue to monitor Her updated medication list for this problem includes:    Toprol Xl 100 Mg Tb24 (Metoprolol succinate) .Marland Kitchen... 1 by mouth qd    Furosemide 80 Mg Tabs (Furosemide) .Marland Kitchen... Take 1 tablet by mouth once a day    Cardizem Cd 360 Mg Cp24 (Diltiazem hcl coated beads) .Marland Kitchen... Take 1 capsule by mouth once a day   Problem # 4:  ABNORMAL MAMMOGRAM (ICD-793.80) with rec for close radiol f/u will ref to sx for further eval Orders: Surgical Referral (Surgery)   Complete Medication List: 1)  Toprol Xl 100 Mg Tb24 (Metoprolol succinate) .Marland Kitchen.. 1 by mouth qd 2)  Adult Aspirin Ec Low Strength 81 Mg Tbec  (Aspirin) .... Take 1 tablet by mouth once a day 3)  Furosemide 80 Mg Tabs (Furosemide) .... Take 1 tablet by mouth once a day 4)  Cardizem Cd 360 Mg Cp24 (Diltiazem hcl coated beads) .... Take 1 capsule by mouth once a day 5)  Klor-con M20 20 Meq Tbcr (Potassium chloride crys cr) .... Take 1 tablet by mouth once a day 6)  Alprazolam 0.25 Mg Tabs (Alprazolam) .... Take 1 tablet by mouth two times a day 7)  Balanced Care Tabs (Multiple vitamins-minerals) .... Take 1 tablet by mouth once a day 8)  Calcium 600 Tabs (Calcium carbonate tabs) .... Take 1 tablet by mouth once a day 9)  Albertsons Natural Magnesium Tabs (Magnesium tabs) .... Take 1 tablet by mouth once a day   Patient Instructions: 1)  we will schedule MRI of LS for low back and leg pain (no contrast)- (x ray showed degenerative change at L5- S1) 2)  we will do referral to surgeon in [facility] for abnormal mammogram    Prescriptions: TOPROL XL 100 MG  TB24 (METOPROLOL SUCCINATE) 1 by mouth qd  #30 x 11   Entered and Authorized by:   Judith Part MD   Signed by:   Judith Part MD on 12/21/2006   Method used:   Print then Give to Patient   RxID:   [id number] CARDIZEM CD 360 MG  CP24 (DILTIAZEM HCL COATED BEADS) Take 1 capsule by mouth once a day  #30 x 11   Entered and Authorized by:   Judith Part MD   Signed by:   Judith Part MD on 12/21/2006   Method used:   Print then Give to Patient   RxID:   [id number]  ]

## 2010-03-25 NOTE — Letter (Signed)
Summary: [facility]   Imported By: Lanelle Bal 02/16/2010 13:02:05  _____________________________________________________________________  External Attachment:    Type:   Image     Comment:   External Document

## 2010-03-25 NOTE — Consult Note (Signed)
Summary: [facility]Dr. Evette Cristal   Imported By: Eleonore Chiquito 07/10/2007 15:51:32  _____________________________________________________________________  External Attachment:    Type:   Image     Comment:   External Document

## 2010-03-25 NOTE — Assessment & Plan Note (Signed)
Summary: 10:45SWOLLEN FEET,STOMACH BLOATED/CLE   Vital Signs:  Patient profile:   75 year old female Weight:      259 pounds BMI:     41.64 Temp:     98.4 degrees F oral Pulse rate:   68 / minute Pulse rhythm:   regular BP sitting:   160 / 96  (left arm) Cuff size:   large  Vitals Entered By: Lowella Petties CMA (March 05, 2009 11:03 AM) CC: Swelling in ankles,feet and hands.   History of Present Illness: here for swelling feet and ankles and body and hands feel swollen  also bags under eyes   more swelling since she got off plane - last sunday  is not getting better  abd feels bloated too     wt is up 9 lb from last visit  thinks this mostly from fluid  did eat out a lot -- asian food/ lots of sodium   no cp of shortness of breath   bp high today at 160/96  had big work up from neuology -- with carotid US and mri/ mra  never found reason for headache- but it is resolved  thinks this was stress related  she had to give up ties to her mother- this is much better now     Allergies: 1)  ! Buspar 2)  Lisinopril (Lisinopril)  Past History:  Past Medical History: Last updated: 02/27/2008 Allergic rhinitis Anxiety Depression Hyperlipidemia Hypertension obesity compulsive overeating  OA knees with injections  DM2  Past Surgical History: Last updated: 12/21/2006 Hysterectomy- partial, fibroids Lumpectomy, right x1 (1989),   left x 2 (1970's, 1990) Colonoscopy- diverticulosis (12/2000) Pelvic US (2000, 2001) Dexa- normal (04/2000) Cardiac cath- normal per pt (2001) Sleep study (06/2003) Colonoscopy- diverticulosis, hemorrhoids (10/2004) Adenosine cardiolite- low risk (02/2006)  Family History: Last updated: Jun 25, 2008 Father: died age 63- CVA Mother: HTN (has a lot of animosity in her relationship with her mother) Siblings: 2 brothers- 1, prostate cancer. 1, Hep C. 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 Eyes:  Denies blurring. CV:  Complains of swelling of feet, swelling of hands, and weight gain; denies chest pain or discomfort, lightheadness, palpitations, and shortness of breath with exertion. Resp:  Denies cough, shortness of breath, and wheezing. GI:  Denies abdominal pain, change in bowel habits, and nausea. MS:  Denies joint redness, cramps, and muscle weakness. Derm:  Denies itching, lesion(s), poor wound healing, and rash. Neuro:  Denies numbness and tingling. Psych:  Complains of anxiety; very stressed- but that is getting better . Endo:  Denies cold intolerance, excessive thirst, excessive urination, heat intolerance, and polyuria. Heme:  Denies abnormal bruising and bleeding.  Physical Exam  General:  obese and well appearing  Head:  normocephalic, atraumatic, and no abnormalities observed.   Eyes:  vision grossly intact, pupils equal, pupils round, and pupils reactive to light.  no conjunctival pallor, injection or icterus  Mouth:  pharynx pink and moist.   Neck:  supple with full rom and no masses or thyromegally, no JVD or carotid bruit  Lungs:  Normal respiratory effort, chest expands symmetrically. Lungs are clear to auscultation, no crackles  or wheezes. Heart:  Normal rate and regular rhythm. S1 and S2 normal without gallop, murmur, click, rub or other extra sounds. Abdomen:  obese abd  soft, non-tender, normal bowel sounds, no distention, and no masses.  no ascites noted  no renal bruits  Msk:  No deformity or scoliosis noted of thoracic or lumbar spine.   Pulses:  R and L carotid,radial,femoral,dorsalis pedis and posterior tibial  pulses are full and equal bilaterally Extremities:  1-2 plus edema in LEs  Neurologic:  sensation intact to light touch, gait normal, and DTRs symmetrical and normal.   Skin:  Intact without suspicious lesions or rashes Cervical Nodes:  No lymphadenopathy noted Inguinal Nodes:  No significant adenopathy Psych:  normal affect, talkative and pleasant  does seem stressed   Diabetes Management Exam:    Foot Exam (with socks and/or shoes not present):       Sensory-Pinprick/Light touch:          Left medial foot (L-4): normal          Left dorsal foot (L-5): normal          Left lateral foot (S-1): normal          Right medial foot (L-4): normal          Right dorsal foot (L-5): normal          Right lateral foot (S-1): normal       Sensory-Monofilament:          Left foot: normal          Right foot: normal       Inspection:          Left foot: normal          Right foot: normal       Nails:          Left foot: normal          Right foot: normal   Impression & Recommendations:  Problem # 1:  EDEMA (ICD-782.3) Assessment Deteriorated much worse after travel and high salt diet with wt gain of 9 lb  no cardiac symptoms or sob  disc this in detail and made plan for low sodium diet (handout given from [facility])  will add zaroxolyn 5 mg daily for 5 days and then f/u (disc poss side eff - will update  also working on diet and exercise  lab today inc renal panel and BNP Her updated medication list for this problem includes:    Furosemide 80 Mg Tabs (Furosemide) .Marland Kitchen... Take 1 tablet by mouth once a day    Zaroxolyn 5 Mg Tabs (Metolazone) .Marland Kitchen... 1 by mouth once daily in am  Orders: Venipuncture (38756) TLB-Renal Function Panel (80069-RENAL) TLB-BNP (B-Natriuretic Peptide) (83880-BNPR)  Problem # 2:  HYPERTENSION (ICD-401.9) Assessment: Deteriorated bp is up today- suspect related to fluid retention and stress  adding zaroxolyn-- re check at f/u next week  wlll update if headache or  other symptoms  Her updated medication list for this problem includes:    Toprol Xl 100 Mg Tb24 (Metoprolol succinate) .Marland Kitchen... 1 by mouth daily    Furosemide 80 Mg Tabs (Furosemide) .Marland Kitchen... Take 1 tablet by mouth once a day    Cardizem Cd 360 Mg Cp24 (Diltiazem hcl coated beads) .Marland Kitchen... Take 1 capsule by mouth once a day    Zaroxolyn 5 Mg Tabs (Metolazone) .Marland Kitchen... 1 by mouth once daily in am  Complete Medication List: 1)  Toprol Xl 100 Mg Tb24 (Metoprolol succinate) .Marland KitchenMarland KitchenMarland Kitchen  1 by mouth daily 2)  Adult Aspirin Ec Low Strength 81 Mg Tbec (Aspirin) .... Take 1 tablet by mouth once a day 3)  Furosemide 80 Mg Tabs (Furosemide) .... Take 1 tablet by mouth once a day 4)  Cardizem Cd 360 Mg Cp24 (Diltiazem hcl coated beads) .... Take 1 capsule by mouth once a day 5)  Klor-con M20 20 Meq Tbcr (Potassium chloride crys cr) .... Take 1 tablet by mouth two times a day 6)  Balanced Care Tabs (Multiple vitamins-minerals) .... Take 1 tablet by mouth once a day 7)  Calcium 600 Tabs (Calcium carbonate tabs) .... Take 1 tablet by mouth two times a day 8)  [employer] Natural Magnesium Tabs (Magnesium tabs) .... Take 1 tablet by mouth once a day 9)  Glucometer  .... To check glucose once daily and as needed for dm 250.0 10)  Accu Check Aviva Strips and Softclix Lancets  .... Use to check blood sugar once daily and as needed for dm 11)  Cyclobenzaprine Hcl 10 Mg Tabs (Cyclobenzaprine hcl) .... Take one by mouth every 8 hours as needed for neck pain 12)  Fish Oil 1000 Mg Caps (Omega-3 fatty acids) .Marland Kitchen.. 1 by mouth once daily 13)  Zaroxolyn 5 Mg Tabs (Metolazone) .Marland Kitchen.. 1 by mouth once daily in am  Patient Instructions: 1)  take zaroxolyn one pill daily in am until your follow up next week 2)  if any side effects like cramps - let me know  3)  labs today  4)  follow up with me monday or tuesday  5)  stay on  a low salt diet - and also work on weight loss  Prescriptions: ZAROXOLYN 5 MG TABS (METOLAZONE) 1 by mouth once daily  in am  #10 x 0   Entered and Authorized by:   Judith Part MD   Signed by:   Judith Part MD on 03/05/2009   Method used:   Print then Give to Patient   RxID:   [id number]   Prior Medications (reviewed today): TOPROL XL 100 MG  TB24 (METOPROLOL SUCCINATE) 1 by mouth daily ADULT ASPIRIN EC LOW STRENGTH 81 MG  TBEC (ASPIRIN) Take 1 tablet by mouth once a day FUROSEMIDE 80 MG  TABS (FUROSEMIDE) Take 1 tablet by mouth once a day CARDIZEM CD 360 MG  CP24 (DILTIAZEM HCL COATED BEADS) Take 1 capsule by mouth once a day KLOR-CON M20 20 MEQ  TBCR (POTASSIUM CHLORIDE CRYS CR) Take 1 tablet by mouth two times a day BALANCED CARE   TABS (MULTIPLE VITAMINS-MINERALS) Take 1 tablet by mouth once a day CALCIUM 600   TABS (CALCIUM CARBONATE TABS) Take 1 tablet by mouth two times a day ALBERTSONS NATURAL MAGNESIUM   TABS (MAGNESIUM TABS) Take 1 tablet by mouth once a day GLUCOMETER () to check glucose once daily and as needed for DM 250.0 ACCU CHECK AVIVA STRIPS AND SOFTCLIX LANCETS () use to check blood sugar once daily and as needed for DM CYCLOBENZAPRINE HCL 10 MG TABS (CYCLOBENZAPRINE HCL) take one by mouth every 8 hours as needed for neck pain FISH OIL 1000 MG CAPS (OMEGA-3 FATTY ACIDS) 1 by mouth once daily Current Allergies: ! BUSPAR LISINOPRIL (LISINOPRIL)

## 2010-03-25 NOTE — Progress Notes (Signed)
Summary: requesting anti depressant  Phone Note From Other Clinic   Caller: Melissa with [facility]  [phone] Summary of Call: Home health nurse states pt is suffering from increased  anxiety and depression since having knee surgery.  She is asking that an anti depressant be called to [facility].  That is all the information that she left, I called her back and left a message. Initial call taken by: Lowella Petties CMA,  November 19, 2009 4:00 PM  Follow-up for Phone Call        I have to get a better low down on her symptoms (I know that she cannot come in , however)  sad/ tearful/ anxious/ suicidal, sleep/ appetite? Follow-up by: Judith Part MD,  November 19, 2009 4:15 PM  Additional Follow-up for Phone Call Additional follow up Details #1::        Left message for Melissa with [facility] to call back. Lewanda Rife LPN  November 20, 2009 11:11 AM   Spoke with Efraim Kaufmann with [facility]. Pt is not suicidal. Pt is worried about post op pain in rt knee and anxious about having to have the left knee done at a later time. Pt is having to stay inside since her surgery. Pt is teary eyed and crying and gloomy. Pt is not having panic attacks. As far as Efraim Kaufmann knows pt is eating and sleeping without problem. Melissa said to call pt with answer if Dr Milinda Antis is going to give her med.Lewanda Rife LPN  November 20, 2009 4:16 PM       Additional Follow-up for Phone Call Additional follow up Details #2::    have her come in when mobile enough to do so for 30 min depression visit I would start her on zoloft - there is rare side eff of worse dep/ suicidal tendancy so she needs to be monitored carefully  hope this will help the depression and the anxiety and possibly the pain px written on EMR for call in   Follow-up by: Judith Part MD,  November 20, 2009 4:30 PM  Additional Follow-up for Phone Call Additional follow up Details #3:: Details for Additional Follow-up Action Taken: Patient notified as  instructed by telephone. Pt said she would call back to schedule appt to see Dr Milinda Antis. Medication phoned to [facility]  pharmacy as instructed. Lewanda Rife LPN  November 20, 2009 4:39 PM   New/Updated Medications: ZOLOFT 50 MG TABS (SERTRALINE HCL) 1/2 by mouth once daily in evening for 1 week and then increase to 1 by mouth once daily Prescriptions: ZOLOFT 50 MG TABS (SERTRALINE HCL) 1/2 by mouth once daily in evening for 1 week and then increase to 1 by mouth once daily  #30 x 2   Entered and Authorized by:   Judith Part MD   Signed by:   Lewanda Rife LPN on 56/21/3086   Method used:   Telephoned to ...       [facility]* (retail)       [address]       [place], [place]  [address]       Ph: [phone]       Fax: [phone]   RxID:   [id number]

## 2010-03-25 NOTE — Assessment & Plan Note (Signed)
Summary: SURGICAL CLEARANCE FOR TKR/CLE   Vital Signs:  Patient profile:   75 year old female Height:      66.25 inches Weight:      236.25 pounds BMI:     37.98 Temp:     99.1 degrees F oral Pulse rate:   76 / minute Pulse rhythm:   regular BP sitting:   124 / 82  (left arm) Cuff size:   large  Vitals Entered By: Lewanda Rife LPN (Mar 02, 2010 12:15 PM) CC: gout Comments Pt has appt to see pulmonologist Dr Vassie Loll on 02/12/10 ? sleep study. Spoke with at [facility] at  Dr Erasmo Leventhal orthopedic office and she said if pt is not ready to have surgery  can hold off on Pre operative clearance form for now until more definite time for surgery. Clydie Braun likes pre operative clearance within 2 weeks of surgery but can hold preop form for to one year will leave up to pt. I told pt and she wants to wait on preop form. (pt kept the preop form from Dr Sable Feil office.)   History of Present Illness: will hold off on preop exam for knee surg because not ready to have it yet she will hold on to the form  is in between episodes of gout  last episode was last week in the toe - and that is better colcrys really helps  low purine diet   lost weight  lost some of her appetite   not ready for 2nd tkr yet  R knee is better now  will likely need pulm clearance yet   whole family has gout     Allergies: 1)  ! Buspar 2)  ! * Vit C 3)  Lisinopril (Lisinopril)  Past History:  Family History: Last updated: 03-02-2010 Father: died age 70- CVA Mother: HTN (has a lot of animosity in her relationship with her mother) Siblings: 2 brothers- 1, prostate cancer. 1, Hep C. 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  (approx 20 yrs ago)  Risk Factors: Alcohol Use: <1 (06/23/2009)  Risk Factors: Smoking Status: quit (06/23/2009) Packs/Day: 0.5 (06/23/2009)  Past Medical History: Allergic rhinitis Anxiety Depression Hyperlipidemia Hypertension obesity compulsive overeating  OA knees with injections  DM2 gout  Past Surgical History: Hysterectomy- partial, fibroids Lumpectomy, right x1 (1989),   left x 2 (1970's, 1990) Colonoscopy- diverticulosis (12/2000) Pelvic US (2000, 2001) Dexa- normal (04/2000) Cardiac cath- normal per pt (2001) Sleep study (06/2003) Colonoscopy- diverticulosis, hemorrhoids (10/2004) Adenosine cardiolite- low risk (02/2006) R knee replacement   Family History: Father: died age 59- CVA Mother: HTN (has a lot of animosity in her relationship with her mother) Siblings: 2 brothers- 1, prostate cancer. 1, Hep C. 1 sister, breast cancer brother is addicted to drugs  brother with hep C and alcoholism (terminal) whole family has gout   Review of Systems General:  Denies fatigue, loss of appetite, and malaise. Eyes:  Denies blurring and eye irritation. CV:  Denies chest pain or discomfort, lightheadness, and palpitations. Resp:  Denies cough and wheezing. GI:  Denies abdominal pain and change in bowel habits. GU:  Denies dysuria and urinary frequency. MS:  Complains of joint pain, joint redness, joint swelling, and stiffness;  denies muscle aches and cramps. Derm:  Denies itching, lesion(s), poor wound healing, and rash. Neuro:  Denies numbness and tingling. Psych:  mood is fair lately. Endo:  Denies cold intolerance and heat intolerance. Heme:  Denies abnormal bruising and bleeding.  Physical Exam  General:  obese and well appearing  Head:  normocephalic, atraumatic, and no abnormalities observed.   Eyes:  vision grossly intact, pupils equal, pupils round, and pupils reactive to light.  no conjunctival pallor, injection or icterus  Mouth:  pharynx pink and  moist.   Neck:  supple with full rom and no masses or thyromegally, no JVD or carotid bruit  Lungs:  Normal respiratory effort, chest expands symmetrically. Lungs are clear to auscultation, no crackles or wheezes. Heart:  Normal rate and regular rhythm. S1 and S2 normal without gallop, murmur, click, rub or other extra sounds. Msk:  bunion formation bilateral medial  no gout s/s today  nl rom ankles and feet  poor rom bilat knees- pt unable to get on the table Pulses:  R and L carotid,radial,femoral,dorsalis pedis and posterior tibial pulses are full and equal bilaterally Extremities:  trace edema bilat- much improved Neurologic:  sensation intact to light touch, gait normal, and DTRs symmetrical and normal.   Skin:  Intact without suspicious lesions or rashes Cervical Nodes:  No lymphadenopathy noted Psych:  normal affect, talkative and pleasant   Diabetes Management Exam:    Foot Exam (with socks and/or shoes not present):       Sensory-Pinprick/Light touch:          Left medial foot (L-4): normal          Left dorsal foot (L-5): normal          Left lateral foot (S-1): normal          Right medial foot (L-4): normal          Right dorsal foot (L-5): normal          Right lateral foot (S-1): normal       Sensory-Monofilament:          Left foot: normal          Right foot: normal       Inspection:          Left foot: normal          Right foot: normal       Nails:          Left foot: normal          Right foot: normal   Impression & Recommendations:  Problem # 1:  GOUT, UNSPECIFIED (ICD-274.9) Assessment Unchanged  with high uric acid and frequent reoccurances even off diuretic start allopurinol two times a day 100  labs 1 mo  update if side eff low purine diet disc  Her updated medication list for this problem includes:    Colcrys 0.6 Mg Tabs (Colchicine) .Marland Kitchen... 1 by mouth two times a day as needed gout- take with food    Allopurinol 100 Mg Tabs (Allopurinol) .Marland Kitchen... 1  by mouth two times a day to prevent gout  Orders: Prescription Created Electronically [phone])  Problem # 2:  OSTEOARTHRITIS, KNEES, BILATERAL (ICD-715.96) Assessment: Unchanged  slowly improving with replaced knee  not ready to schedule next one yet will return for preop exam at that time Her updated medication list for this problem includes:    Adult Aspirin Ec Low Strength 81 Mg Tbec (Aspirin) .Marland Kitchen... Take 1 tablet by mouth  once a day- hold    Tramadol Hcl 50 Mg Tabs (Tramadol hcl) .Marland Kitchen... Take 1 tablet by mouth every 4- 6 hours as needed  Orders: Prescription Created Electronically [phone])  Complete Medication List: 1)  Toprol Xl 100 Mg Tb24 (Metoprolol succinate) .Marland Kitchen.. 1 by mouth daily 2)  Adult Aspirin Ec Low Strength 81 Mg Tbec (Aspirin) .... Take 1 tablet by mouth once a day- hold 3)  Furosemide 80 Mg Tabs (Furosemide) .... Take 1 tablet by mouth once a day 4)  Klor-con M20 20 Meq Tbcr (Potassium chloride crys cr) .... Take 1 tablet by mouth two times a day 5)  Balanced Care Tabs (Multiple vitamins-minerals) .... Take 1 tablet by mouth once a day 6)  Calcium 600 Tabs (Calcium carbonate tabs) .... Take 1 tablet by mouth two times a day 7)  Glucometer  .... To check glucose once daily and as needed for dm 250.0 8)  Accu Check Aviva Strips and Softclix Lancets  .... Use to check blood sugar once daily and as needed for dm 9)  Tramadol Hcl 50 Mg Tabs (Tramadol hcl) .... Take 1 tablet by mouth every 4- 6 hours as needed 10)  Colcrys 0.6 Mg Tabs (Colchicine) .Marland Kitchen.. 1 by mouth two times a day as needed gout- take with food 11)  Zoloft 50 Mg Tabs (Sertraline hcl) .... 1/2 once daily 12)  Mag Oxide Over The Counter 400 Mg  .Marland KitchenMarland Kitchen. 1 by mouth once daily 13)  Allopurinol 100 Mg Tabs (Allopurinol) .Marland Kitchen.. 1 by mouth two times a day to prevent gout  Patient Instructions: 1)  start allopurinol 1 pill two times a day to prevent gout (by lowering your uric acid level )  2)  if any side effects or  problems let me know  3)  it is still ok to take colchicine for flares  4)  drink lots of water  5)  continue low purine diet  6)  schedule non fasting labs in 1 mo for hepatic panel and uric acid for gout  7)  then will advise further  Prescriptions: COLCRYS 0.6 MG TABS (COLCHICINE) 1 by mouth two times a day as needed gout- take with food  #15 x 2   Entered and Authorized by:   Judith Part MD   Signed by:   Judith Part MD on 02/09/2010   Method used:   Electronically to        [facility]* (retail)       [address]       [place], [place]  [address]       Ph: [phone]       Fax: [phone]   RxID:   [id number] ALLOPURINOL 100 MG TABS (ALLOPURINOL) 1 by mouth two times a day to prevent gout  #60 x 11   Entered and Authorized by:   Judith Part MD   Signed by:   Judith Part MD on 02/09/2010   Method used:   Electronically to        [facility]* (retail)       [address]       [place], [place]  [address]       Ph: [phone]       Fax: [phone]   RxID:   [id number]    Orders Added: 1)  Prescription Created Electronically [G8553] 2)  Est. Patient Level III [73220]    Current Allergies (reviewed today): ! BUSPAR ! * VIT C LISINOPRIL (LISINOPRIL)

## 2010-03-25 NOTE — Progress Notes (Signed)
Summary: pt needs surgical clearance  Phone Note Call from Patient Call back at Home Phone [phone]   Caller: Patient Call For: Judith Part MD Summary of Call: Pt is asking if she will get medical clearance for knee surgery. She has a form to drop off and is asking if the EKG that she had at [facility] is sufficient, or does she need an office visit. Initial call taken by: Lowella Petties CMA, AAMA,  March 08, 2010 11:21 AM  Follow-up for Phone Call        needs office visit - we did not do clearance at last visit since she was not ready for surgery yet - thanks Follow-up by: Judith Part MD,  March 08, 2010 1:28 PM  Additional Follow-up for Phone Call Additional follow up Details #1::        Patient notified as instructed by telephone. Patient will schedule an appointment. Additional Follow-up by: Sydell Axon LPN,  March 08, 2010 1:55 PM

## 2010-03-25 NOTE — Letter (Signed)
Summary: Results Follow up Letter  [facility]  [address] [place], [place] [address]   Phone: [phone]  Fax: [phone]    09/05/2008 MRN: [id number]  Monica [name] [address] [place], [place]  [address]  Dear [name],  The following are the results of your recent test(s):  Test         Result    Pap Smear:        Normal _____  Not Normal _____ Comments: ______________________________________________________ Cholesterol: LDL(Bad cholesterol):         Your goal is less than:         HDL (Good cholesterol):       Your goal is more than: Comments:  ______________________________________________________ Mammogram:        Normal _____  Not Normal _____ Comments:  ___________________________________________________________________ Hemoccult:        Normal _____  Not normal _______ Comments:    _____________________________________________________________________ Other Tests:    Labs are fairly stable including sugar control. Next labs and followup with me in 3 months as planned> Do your best to watch diet and exercise.    We routinely do not discuss normal results over the telephone.  If you desire a copy of the results, or you have any questions about this information we can discuss them at your next office visit.   Sincerely,

## 2010-03-25 NOTE — Consult Note (Signed)
Summary: CORE BREAST BIOPSY REPORT / DR. SANKAR  CORE BREAST BIOPSY REPORT / DR. [facility]   Imported By: Carin Primrose 03/15/2007 17:21:13  _____________________________________________________________________  External Attachment:    Type:   Image     Comment:   External Document

## 2010-03-25 NOTE — Assessment & Plan Note (Signed)
Summary: 1 MONTH FOLLOW UP/[facility]   Vital Signs:  Patient Profile:   75 Years Old Female Weight:      262 pounds Temp:     98.2 degrees F oral Pulse rate:   72 / minute Pulse rhythm:   regular BP sitting:   138 / 70  (left arm) Cuff size:   large  Vitals Entered By: Sydell Axon (October 10, 2007 3:22 PM)                 Chief Complaint:  1 month followup.  History of Present Illness: took her med 11/2 days -- had side effects and decided she did not want med after all made her really sleepy and also made her feet swell  has bouts of anx that are worse than others is overall doing better now  occ takes 1/2 xanax -- very infrequently (secure to know that it is there if she needs it )  talks to her pastor and church friends  is getting along better with her mother -- she is 36 her brother moved closer and this is helpful  had a big family reunion - this went well   eating is no better and no worse   has gone back to water aerobics   chol -- LDL is up 20 points -- does not know why thinks her diet is pretty much the same  fried foods up to twice or three times per week red meat once per week  eats fried shrimp all the time ( does not think she is allergic to it anymore ) does eat a lot of cheese  does eat frequent bisciuts   sugar is borderline at 105   using baby wipes for personal cleansing -- has spot on labia that is bothering her  she actually used exfoliant glove/ product to get pubic area more clean no bleeding  no hx of hsv or other std hyst in past some urge incontinence   needs to get a few px refilled     Current Allergies (reviewed today): ! BUSPAR  Past Medical History:    Reviewed history from 09/06/2007 and no changes required:       Allergic rhinitis       Anxiety       Depression       Hyperlipidemia       Hypertension       obesity       compulsive overeating        OA knees with injections   Past Surgical History:    Reviewed  history from 12/21/2006 and no changes required:       Hysterectomy- partial, fibroids       Lumpectomy, right x1 (1989),   left x 2 (1970's, 1990)       Colonoscopy- diverticulosis (12/2000)       Pelvic US (2000, 2001)       Dexa- normal (04/2000)       Cardiac cath- normal per pt (2001)       Sleep study (06/2003)       Colonoscopy- diverticulosis, hemorrhoids (10/2004)       Adenosine cardiolite- low risk (02/2006)   Family History:    Reviewed history from 12/21/2006 and no changes required:       Father: died age 70- CVA       Mother: HTN (has a lot of animosity in her relationship with her mother)       Siblings: 2  brothers- 1, prostate cancer. 1, Hep C. 1 sister, breast cancer       brother is addicted to drugs          Social History:    Reviewed history from 09/06/2007 and no changes required:       Marital Status: widow       Children: 1 daughter       Occupation: retired principal       non smoker       no alcohol     Review of Systems  General      Complains of fatigue.      Denies fever, loss of appetite, and malaise.  Eyes      Denies blurring and eye pain.  CV      Complains of swelling of feet.      Denies chest pain or discomfort, palpitations, and shortness of breath with exertion.  Resp      Denies cough, shortness of breath, and wheezing.  GI      Denies abdominal pain and change in bowel habits.  GU      Complains of incontinence.      Denies discharge and dysuria.  Derm      Denies itching and rash.  Neuro      Denies numbness and tingling.  Psych      Complains of anxiety.      Denies sense of great danger and suicidal thoughts/plans.  Endo      Denies cold intolerance, excessive thirst, excessive urination, and heat intolerance.   Physical Exam  General:     overweight but generally well appearing  Head:     normocephalic, atraumatic, and no abnormalities observed.   Eyes:     vision grossly intact, pupils equal,  pupils round, and pupils reactive to light.   Neck:     supple with full rom and no masses or thyromegally, no JVD or carotid bruit  Lungs:     Normal respiratory effort, chest expands symmetrically. Lungs are clear to auscultation, no crackles or wheezes. Heart:     Normal rate and regular rhythm. S1 and S2 normal without gallop, murmur, click, rub or other extra sounds. Abdomen:     soft and non-tender.  no renal bruits  Genitalia:     abrasion .5 cm on r labia majora -- no ulceration or discoloration or swelling  no cyst seen or palpated  Msk:     poor rom of knees  gait is slow Pulses:     R and L carotid,radial,femoral,dorsalis pedis and posterior tibial pulses are full and equal bilaterally Extremities:     trace left pedal edema and trace right pedal edema.   Neurologic:     sensation intact to light touch and DTRs symmetrical and normal.  no tremor  Skin:     Intact without suspicious lesions or rashes Cervical Nodes:     No lymphadenopathy noted Psych:     anxious affect- less so than last visit talks freely about stress with good eye contact     Impression & Recommendations:  Problem # 1:  HYPERTENSION (ICD-401.9) Assessment: Unchanged bp is well controlled with current medicines -- rev labs and need for lifestyle change  Her updated medication list for this problem includes:    Toprol Xl 100 Mg Tb24 (Metoprolol succinate) .Marland Kitchen... 1 by mouth daily    Furosemide 80 Mg Tabs (Furosemide) .Marland Kitchen... Take 1 tablet by mouth once a day  Cardizem Cd 360 Mg Cp24 (Diltiazem hcl coated beads) .Marland Kitchen... Take 1 capsule by mouth once a day  BP today: 138/70 Prior BP: 148/90 (09/06/2007)  Labs Reviewed: Creat: 1.1 (10/05/2007) Chol: 202 (10/05/2007)   HDL: 43.6 (10/05/2007)   LDL: DEL (10/05/2007)   TG: 94 (10/05/2007)   Problem # 2:  HYPERLIPIDEMIA (ICD-272.4) Assessment: Deteriorated with inc in LDL and very high fat diet disc lower sat fats in great detail  will change diet  -- re check in 59mo (if not imp consider statin) disc inc risks of vascular dz with high chol  Labs Reviewed: Chol: 202 (10/05/2007)   HDL: 43.6 (10/05/2007)   LDL: DEL (10/05/2007)   TG: 94 (10/05/2007) SGOT: 26 (10/05/2007)   SGPT: 35 (10/05/2007)   Problem # 3:  ANXIETY (ICD-300.00) Assessment: Unchanged per pt - waxes and wanes with stress not tolerant of buspar- and does not want daily pharm tx for this again- advised counseling for maladaptive coping techniques and compulsive overeating (at times in denial) The following medications were removed from the medication list:    Buspar 15 Mg Tabs (Buspirone hcl) .Marland Kitchen... 1/2 by mouth two times a day  Her updated medication list for this problem includes:    Alprazolam 0.25 Mg Tabs (Alprazolam) .Marland Kitchen... Take 1 tablet by mouth once daily as needed severe anxiety   Problem # 4:  FASTING HYPERGLYCEMIA (ICD-790.29) Assessment: New with sugar of 105 and obesity  is at big risk of DM- pt is aware will check AIC at next labs and disc at f/u  Problem # 5:  ABRASION (ICD-919.0) Assessment: New on R labia majora-- likely from trying to exfoliate the area disc imp of gentle cleansing  can use barrier oint like vaseline or desitin- and will re check at f/u  Complete Medication List: 1)  Toprol Xl 100 Mg Tb24 (Metoprolol succinate) .Marland Kitchen.. 1 by mouth daily 2)  Adult Aspirin Ec Low Strength 81 Mg Tbec (Aspirin) .... Take 1 tablet by mouth once a day 3)  Furosemide 80 Mg Tabs (Furosemide) .... Take 1 tablet by mouth once a day 4)  Cardizem Cd 360 Mg Cp24 (Diltiazem hcl coated beads) .... Take 1 capsule by mouth once a day 5)  Klor-con M20 20 Meq Tbcr (Potassium chloride crys cr) .... Take 1 tablet by mouth two times a day 6)  Alprazolam 0.25 Mg Tabs (Alprazolam) .... Take 1 tablet by mouth once daily as needed severe anxiety 7)  Balanced Care Tabs (Multiple vitamins-minerals) .... Take 1 tablet by mouth once a day 8)  Calcium 600 Tabs (Calcium  carbonate tabs) .... Take 1 tablet by mouth two times a day 9)  [employer] Natural Magnesium Tabs (Magnesium tabs) .... Take 1 tablet by mouth once a day 10)  Ketoconazole 2 % Crea (Ketoconazole) .... Apply twice daily to affected area   Patient Instructions: 1)  you can raise your HDL (good cholesterol) by increasing exercise and eating omega 3 fatty acid supplement like fish oil or flax seed oil over the counter 2)  you can lower LDL (bad cholesterol) by limiting saturated fats in diet like red meat, fried foods, egg yolks, fatty breakfast meats, high fat dairy products and shellfish  3)  exercise at least 20 minutes five days per week 4)  schedule fasting labs lipid/ast/alt /AIC in 2 mo then follow up  5)  I still recommend counseling to help the way you deal with stress and get control of your eating  6)  do not  use abrasive cleansers in pubic area-- and you can try some vaseline or desitin over the counter on irritated area- and we will check at next follow up    Prescriptions: KLOR-CON M20 20 MEQ  TBCR (POTASSIUM CHLORIDE CRYS CR) Take 1 tablet by mouth two times a day  #60 x 11   Entered and Authorized by:   Judith Part MD   Signed by:   Judith Part MD on 10/10/2007   Method used:   Print then Give to Patient   RxID:   [id number] FUROSEMIDE 80 MG  TABS (FUROSEMIDE) Take 1 tablet by mouth once a day  #30 x 11   Entered and Authorized by:   Judith Part MD   Signed by:   Judith Part MD on 10/10/2007   Method used:   Print then Give to Patient   RxID:   [id number]  ] Current Allergies (reviewed today): ! BUSPAR

## 2010-03-25 NOTE — Assessment & Plan Note (Signed)
Summary: 2-4 week f/u/bir   Vital Signs:  Patient Profile:   75 Years Old Female Weight:      269 pounds Temp:     98.2 degrees F oral Pulse rate:   72 / minute Pulse rhythm:   regular BP sitting:   122 / 90  (right arm) Cuff size:   large  Vitals Entered By: Liane Comber (March 20, 2008 12:27 PM)                 Chief Complaint:  2 wk f/y.  History of Present Illness: has some swelling in ankles -- is actually better today  woke up with gout in toe on sunday had to take aleve --took 2 doses  is much better today - was very red and swollen  no change in her diruetic/lasix no salty foods   had injections in her knees - and this helped   the lisinopril did not work out -made her dizzy - could not even tolerate 1/2 dose  never stopped it - and still feels dizzy  is really trying to watch salt in her   did first DM visit - and did think it was helpful is starting to check her sugar - easier than she thought it was sugar was 102 at two checks   exercised today with video for upper body- really enjoyed it     Current Allergies (reviewed today): ! BUSPAR LISINOPRIL (LISINOPRIL)  Past Medical History:    Reviewed history from 02/27/2008 and no changes required:       Allergic rhinitis       Anxiety       Depression       Hyperlipidemia       Hypertension       obesity       compulsive overeating        OA knees with injections        DM2  Past Surgical History:    Reviewed history from 12/21/2006 and no changes required:       Hysterectomy- partial, fibroids       Lumpectomy, right x1 (1989),   left x 2 (1970's, 1990)       Colonoscopy- diverticulosis (12/2000)       Pelvic US (2000, 2001)       Dexa- normal (04/2000)       Cardiac cath- normal per pt (2001)       Sleep study (06/2003)       Colonoscopy- diverticulosis, hemorrhoids (10/2004)       Adenosine cardiolite- low risk (02/2006)   Family History:    Reviewed history from 10/10/2007 and  no changes required:       Father: died age 8- CVA       Mother: HTN (has a lot of animosity in her relationship with her mother)       Siblings: 2 brothers- 1, prostate cancer. 1, Hep C. 1 sister, breast cancer       brother is addicted to drugs          Social History:    Reviewed history from 09/06/2007 and no changes required:       Marital Status: widow       Children: 1 daughter       Occupation: retired principal       non smoker       no alcohol     Review of Systems  General      Denies  fatigue, loss of appetite, and malaise.  Eyes      Denies blurring and eye pain.  CV      Complains of lightheadness and swelling of feet.      Denies chest pain or discomfort, near fainting, palpitations, shortness of breath with exertion, and swelling of hands.  GI      Denies abdominal pain and change in bowel habits.  MS      Complains of joint pain, joint redness, joint swelling, and stiffness.      Denies muscle weakness.  Derm      Denies lesion(s) and rash.  Neuro      Denies numbness and tingling.  Psych      mood is a little better   Endo      Denies excessive thirst and excessive urination.   Physical Exam  General:     overweight but generally well appearing  Head:     normocephalic, atraumatic, and no abnormalities observed.   Eyes:     vision grossly intact, pupils equal, pupils round, and pupils reactive to light.   Neck:     supple with full rom and no masses or thyromegally, no JVD or carotid bruit  Lungs:     Normal respiratory effort, chest expands symmetrically. Lungs are clear to auscultation, no crackles or wheezes. Heart:     Normal rate and regular rhythm. S1 and S2 normal without gallop, murmur, click, rub or other extra sounds. Msk:     poor rom knees- but overal walking better today L first MTP joint swollen with slt erythema and warmth and tenderness, but rom is full Pulses:     R and L carotid,radial,femoral,dorsalis pedis and  posterior tibial pulses are full and equal bilaterally Extremities:     trace left pedal edema and trace right pedal edema.   Neurologic:     cranial nerves II-XII intact, sensation intact to light touch, and gait normal.   no nystagmus or ataxia noted  Skin:     Intact without suspicious lesions or rashes Cervical Nodes:     No lymphadenopathy noted Psych:     normal affect, talkative and pleasant     Impression & Recommendations:  Problem # 1:  HYPERTENSION (ICD-401.9) Assessment: Unchanged with adv reaction to ace- dizziness (unfortunately) will d/c that - and pt will start to monitor bp at home with new cuff  may need to consider arb for renal prot also - if bp is not controlled f/u 2-3 mo  The following medications were removed from the medication list:    Lisinopril 5 Mg Tabs (Lisinopril) .Marland Kitchen... 1/2  by mouth once daily  Her updated medication list for this problem includes:    Toprol Xl 100 Mg Tb24 (Metoprolol succinate) .Marland Kitchen... 1 by mouth daily    Furosemide 80 Mg Tabs (Furosemide) .Marland Kitchen... Take 1 tablet by mouth once a day    Cardizem Cd 360 Mg Cp24 (Diltiazem hcl coated beads) .Marland Kitchen... Take 1 capsule by mouth once a day  BP today: 122/90-- re check 135/85 Prior BP: 140/90 (02/27/2008)  Labs Reviewed: Creat: 1.1 (10/05/2007) Chol: 194 (12/13/2007)   HDL: 46.1 (12/13/2007)   LDL: 134 (12/13/2007)   TG: 70 (12/13/2007)   Problem # 2:  DIABETES MELLITUS, TYPE II, MILD (ICD-250.00) Assessment: Unchanged pt is pleased to start DM ed and glucose monitoring and wt loss - good attitude  commended her on this  will continue with DM ed f/u 2-3 mo -will rev sugar  readings as well The following medications were removed from the medication list:    Lisinopril 5 Mg Tabs (Lisinopril) .Marland Kitchen... 1/2  by mouth once daily  Her updated medication list for this problem includes:    Adult Aspirin Ec Low Strength 81 Mg Tbec (Aspirin) .Marland Kitchen... Take 1 tablet by mouth once a day  Labs  Reviewed: HgBA1c: 6.2 (12/13/2007)   Creat: 1.1 (10/05/2007)      Problem # 3:  EDEMA (ICD-782.3) Assessment: Deteriorated poss worse this week in light of knee injections and few days of aleve  adv to avoid sodium and elevate feet - update if not imp  Her updated medication list for this problem includes:    Furosemide 80 Mg Tabs (Furosemide) .Marland Kitchen... Take 1 tablet by mouth once a day   Problem # 4:  GOUT, UNSPECIFIED (ICD-274.9) Assessment: New in L big toe- is improved after several days of aleve disc diet and keeping up fluid intake disc use of aleve- only as needed -- with potential side eff update if not imp  ? if persistant- need to check uric acid and re eval need for diuretic   Her updated medication list for this problem includes:    Aleve 220 Mg Tabs (Naproxen sodium) .Marland Kitchen... 1-2 by mouth two times a day as needed gout -- do not use for more than 2-3 days   Problem # 5:  OBESITY (ICD-278.00) Assessment: Unchanged pt is getting very motivated for change  aware that wt loss will help bp/ DM/ and joint problems  encouraged to continue DM teaching / diet  Complete Medication List: 1)  Toprol Xl 100 Mg Tb24 (Metoprolol succinate) .Marland Kitchen.. 1 by mouth daily 2)  Adult Aspirin Ec Low Strength 81 Mg Tbec (Aspirin) .... Take 1 tablet by mouth once a day 3)  Furosemide 80 Mg Tabs (Furosemide) .... Take 1 tablet by mouth once a day 4)  Cardizem Cd 360 Mg Cp24 (Diltiazem hcl coated beads) .... Take 1 capsule by mouth once a day 5)  Klor-con M20 20 Meq Tbcr (Potassium chloride crys cr) .... Take 1 tablet by mouth two times a day 6)  Alprazolam 0.25 Mg Tabs (Alprazolam) .... Take 1 tablet by mouth once daily as needed severe anxiety 7)  Balanced Care Tabs (Multiple vitamins-minerals) .... Take 1 tablet by mouth once a day 8)  Calcium 600 Tabs (Calcium carbonate tabs) .... Take 1 tablet by mouth two times a day 9)  [employer] Natural Magnesium Tabs (Magnesium tabs) .... Take 1 tablet by mouth  once a day 10)  Glucometer  .... To check glucose once daily and as needed for dm 250.0 11)  Glucose Test Strips and Lancets  .... To check glucose once daily and as needed for dm 250.0 12)  Aleve 220 Mg Tabs (Naproxen sodium) .Marland Kitchen.. 1-2 by mouth two times a day as needed gout -- do not use for more than 2-3 days   Patient Instructions: 1)  stay off the lisinopril and update me if dizziness does not improve  2)  keep up good water intake and avoid salt  3)  work on diet and exercise  4)  aleve is ok once in a while - but not more than 2-3 days in a row because it can hurt stomach and cause increased blood pressure and swelling  5)  we will continue to monitor your blood pressure - may need to add something else if it goes back up  6)  the blood pressure cuff  I recommend is OMRON for arm (and you need large sized cuff) 7)  follow up in 2-3 months

## 2010-03-25 NOTE — Assessment & Plan Note (Signed)
Summary: ?SINUS INFECTION/CLE   Vital Signs:  Patient profile:   75 year old female Height:      66.25 inches Weight:      258 pounds BMI:     41.48 Temp:     97.8 degrees F oral Pulse rate:   80 / minute Pulse rhythm:   regular BP sitting:   128 / 80  (left arm) Cuff size:   large  Vitals Entered By: Liane Comber CMA (September 22, 2008 12:24 PM)  History of Present Illness: thinks she has a sinus infection   june 21 thought she had tooth problem- was on abx for 2 weeks (? abx was on )  throbbing ha in R  also ear feels stopped up  burning in R nostril  spits phlegm out- yellow with tinge of blood   very congested all on R side   no fever - or chills   no cough at all   no n/v/d   Allergies: 1)  ! Buspar 2)  Lisinopril (Lisinopril)  Past History:  Past Medical History: Last updated: 02/27/2008 Allergic rhinitis Anxiety Depression Hyperlipidemia Hypertension obesity compulsive overeating  OA knees with injections  DM2  Past Surgical History: Last updated: 12/21/2006 Hysterectomy- partial, fibroids Lumpectomy, right x1 (1989),   left x 2 (1970's, 1990) Colonoscopy- diverticulosis (12/2000) Pelvic US (2000, 2001) Dexa- normal (04/2000) Cardiac cath- normal per pt (2001) Sleep study (06/2003) Colonoscopy- diverticulosis, hemorrhoids (10/2004) Adenosine cardiolite- low risk (02/2006)  Family History: Last updated: 06/08/2008 Father: died age 54- CVA Mother: HTN (has a lot of animosity in her relationship with her mother) Siblings: 2 brothers- 1, prostate cancer. 1, Hep C. 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 Eyes:  Denies blurring, discharge, and eye pain. ENT:  Complains of earache, nasal congestion, and postnasal drainage; denies ear discharge and sore throat. CV:  Denies chest pain or discomfort and palpitations. Resp:  Denies cough, shortness of breath, and wheezing. GI:  Denies abdominal pain, diarrhea, nausea, and vomiting. Derm:  Denies insect bite(s) and rash. Neuro:  Denies numbness and tingling.  Physical Exam  General:  overweight but generally well appearing  Head:  normocephalic, atraumatic, and no abnormalities observed.  focally tender over R maxillary sinus  no TA tenderness Eyes:  vision grossly intact, pupils equal, pupils round, and pupils reactive to light.  no conjunctival pallor, injection or icterus  Ears:  R TM dull, no erythema  Nose:  nares are mildly congested and injected  Mouth:  MMM, throat clear  Neck:  No deformities, masses, or tenderness noted. Lungs:  Normal respiratory effort, chest expands symmetrically. Lungs are clear to auscultation, no crackles or wheezes. Heart:  Normal rate and regular rhythm. S1 and S2 normal without gallop, murmur, click, rub or other extra sounds. Skin:  Intact without suspicious lesions or rashes Cervical Nodes:  No lymphadenopathy noted Psych:  normal affect, talkative and pleasant    Impression & Recommendations:  Problem # 1:  SINUSITIS - ACUTE-NOS (ICD-461.9) Assessment New R maxillary sinusitis with pain and congestion wil cover with augmentin and update  sympt care- saline ns and warm  compresses/analgesics as needed  update if worse or no imp in 10 days  Her updated medication list for this problem includes:    Augmentin 875-125 Mg Tabs (Amoxicillin-pot clavulanate) .Marland Kitchen... 1 by mouth two times a day for 10 days  Complete Medication List: 1)  Toprol Xl 100 Mg Tb24 (Metoprolol succinate) .Marland Kitchen.. 1 by mouth daily 2)  Adult Aspirin Ec Low Strength 81 Mg Tbec (Aspirin)  .... Take 1 tablet by mouth once a day 3)  Furosemide 80 Mg Tabs (Furosemide) .... Take 1 tablet by mouth once a day 4)  Cardizem Cd 360 Mg Cp24 (Diltiazem hcl coated beads) .... Take 1 capsule by mouth once a day 5)  Klor-con M20 20 Meq Tbcr (Potassium chloride crys cr) .... Take 1 tablet by mouth two times a day 6)  Alprazolam 0.25 Mg Tabs (Alprazolam) .... Take 1 tablet by mouth once daily as needed severe anxiety 7)  Balanced Care Tabs (Multiple vitamins-minerals) .... Take 1 tablet by mouth once a day 8)  Calcium 600 Tabs (Calcium carbonate tabs) .... Take 1 tablet by mouth two times a day 9)  [employer]ons Natural Magnesium Tabs (Magnesium tabs) .... Take 1 tablet by mouth once a day 10)  Glucometer  .... To check glucose once daily and as needed for dm 250.0 11)  Accu Check Aviva Strips and Softclix Lancets  .... Use to check blood sugar once daily and as needed for dm 12)  Aleve 220 Mg Tabs (Naproxen sodium) .Marland Kitchen.. 1-2 by mouth two times a day as needed gout -- do not use for more than 2-3 days 13)  Augmentin 875-125 Mg Tabs (Amoxicillin-pot clavulanate) .Marland Kitchen.. 1 by mouth two times a day for 10 days  Patient Instructions: 1)  take the augmentin as directed for sinus infection  2)  drink lots of water  3)  try saline nasal spray for congestion  4)  update me if not improving (or if worse) after finishing the antibiotic  Prescriptions: AUGMENTIN 875-125 MG TABS (AMOXICILLIN-POT CLAVULANATE) 1 by mouth two times a day for 10 days  #20 x 0   Entered and Authorized by:   Judith Part MD   Signed by:   Judith Part MD on 09/22/2008   Method used:   Print then Give to Patient   RxID:   [id number]   Prior Medications (reviewed today): TOPROL XL 100 MG  TB24 (METOPROLOL SUCCINATE) 1 by mouth daily ADULT ASPIRIN EC LOW STRENGTH 81 MG  TBEC (ASPIRIN) Take 1 tablet by mouth once a day FUROSEMIDE 80 MG  TABS (FUROSEMIDE) Take 1 tablet by mouth once a day CARDIZEM CD 360 MG  CP24  (DILTIAZEM HCL COATED BEADS) Take 1 capsule by mouth once a day KLOR-CON M20 20 MEQ  TBCR (POTASSIUM CHLORIDE CRYS CR) Take 1 tablet by mouth two times a day ALPRAZOLAM 0.25 MG  TABS (ALPRAZOLAM) Take 1 tablet by mouth once daily as needed severe anxiety BALANCED CARE   TABS (MULTIPLE VITAMINS-MINERALS) Take 1 tablet by mouth once a day CALCIUM 600   TABS (CALCIUM CARBONATE TABS) Take 1 tablet by mouth two times a day ALBERTSONS NATURAL MAGNESIUM   TABS (MAGNESIUM TABS) Take 1 tablet by mouth once a day GLUCOMETER () to check glucose once daily and as needed for DM 250.0 ACCU CHECK AVIVA STRIPS AND SOFTCLIX LANCETS () use to check blood sugar once daily and as needed for DM ALEVE 220 MG TABS (NAPROXEN SODIUM) 1-2 by mouth two times a day  as needed gout -- do not use for more than 2-3 days Current Allergies (reviewed today): ! BUSPAR LISINOPRIL (LISINOPRIL) Current Medications (including changes made in today's visit):  TOPROL XL 100 MG  TB24 (METOPROLOL SUCCINATE) 1 by mouth daily ADULT ASPIRIN EC LOW STRENGTH 81 MG  TBEC (ASPIRIN) Take 1 tablet by mouth once a day FUROSEMIDE 80 MG  TABS (FUROSEMIDE) Take 1 tablet by mouth once a day CARDIZEM CD 360 MG  CP24 (DILTIAZEM HCL COATED BEADS) Take 1 capsule by mouth once a day KLOR-CON M20 20 MEQ  TBCR (POTASSIUM CHLORIDE CRYS CR) Take 1 tablet by mouth two times a day ALPRAZOLAM 0.25 MG  TABS (ALPRAZOLAM) Take 1 tablet by mouth once daily as needed severe anxiety BALANCED CARE   TABS (MULTIPLE VITAMINS-MINERALS) Take 1 tablet by mouth once a day CALCIUM 600   TABS (CALCIUM CARBONATE TABS) Take 1 tablet by mouth two times a day ALBERTSONS NATURAL MAGNESIUM   TABS (MAGNESIUM TABS) Take 1 tablet by mouth once a day * GLUCOMETER to check glucose once daily and as needed for DM 250.0 * ACCU CHECK AVIVA STRIPS AND SOFTCLIX LANCETS use to check blood sugar once daily and as needed for DM ALEVE 220 MG TABS (NAPROXEN SODIUM) 1-2 by mouth two times a day as  needed gout -- do not use for more than 2-3 days AUGMENTIN 875-125 MG TABS (AMOXICILLIN-POT CLAVULANATE) 1 by mouth two times a day for 10 days

## 2010-03-25 NOTE — Assessment & Plan Note (Signed)
Summary: rov/jd   Copy to:  pcp Primary Provider/Referring Provider:  Judith Part MD  CC:  6 month followup on OSA- she states that everything is "the same"- had left rt knee replaced in Sept 2011 and is due to have left knee replaced soon.Monica [name]  History of Present Illness: 75/F for pre-op evaluation of obstructive sleep apnea. she was diagnosed with this many years ago, PSG not available, & placed on CPAP. She used it for a few days but just could not keep up with the maintenance, distilled water in humidifier, masks etc so stopped using it. Does not remember whether this was helpful or not.  Epworth Sleepiness Score 9  August 26, 2009 3:13 PM  Reviewed PSG >> poor sleep efficiency- only  62 mins of sleep recorded, AHI was 12.7/h , PLM arousal index was significant - she reports pain & restlessness due to her knees but denies creepy-crawly sensations  February 12, 2010 1:52 PM  underwent rt knee TKR - undergoing rehab, uneventful post op course, pain ok on tramadol, needs hydrocodone sometimes , planning for lt TKR at some point. Lost 254 to 236 - 18 lbs !  Current Medications (verified): 1)  Toprol Xl 100 Mg  Tb24 (Metoprolol Succinate) .Monica [name].. 1 By Mouth Daily 2)  Adult Aspirin Ec Low Strength 81 Mg  Tbec (Aspirin) .... Take 1 Tablet By Mouth Once A Day- Hold 3)  Furosemide 80 Mg  Tabs (Furosemide) .... Take 1 Tablet By Mouth Once A Day 4)  Klor-Con M20 20 Meq  Tbcr (Potassium Chloride Crys Cr) .... Take 1 Tablet By Mouth Two Times A Day 5)  Balanced Care   Tabs (Multiple Vitamins-Minerals) .... Take 1 Tablet By Mouth Once A Day 6)  Calcium 600   Tabs (Calcium Carbonate Tabs) .... Take 1 Tablet By Mouth Two Times A Day 7)  Glucometer .... To Check Glucose Once Daily and As Needed For Dm 250.0 8)  Accu Check Aviva Strips and Softclix Lancets .... Use To Check Blood Sugar Once Daily and As Needed For Dm 9)  Tramadol Hcl 50 Mg Tabs (Tramadol Hcl) .... Take 1 Tablet By Mouth Every 4- 6 Hours  As Needed 10)  Colcrys 0.6 Mg Tabs (Colchicine) .Monica [name].. 1 By Mouth Two Times A Day As Needed Gout- Take With Food 11)  Zoloft 50 Mg Tabs (Sertraline Hcl) .... 1/2 Once Daily 12)  Mag Oxide Over The Counter 400 Mg .Monica [name].. 1 By Mouth Once Daily 13)  Allopurinol 100 Mg Tabs (Allopurinol) .Monica [name].. 1 By Mouth Two Times A Day To Prevent Gout  Allergies (verified): 1)  ! Buspar 2)  ! 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 (approx 20 yrs ago)  Past Surgical History: Hysterectomy- partial, fibroids Lumpectomy, right x1 (1989),   left x 2 (1970's, 1990) Colonoscopy- diverticulosis (12/2000) Pelvic US (2000, 2001) Dexa- normal (04/2000) Cardiac cath- normal per pt (2001) Sleep study (06/2003) Colonoscopy- diverticulosis, hemorrhoids (10/2004) Adenosine cardiolite- low risk (02/2006) Rt knee replaced Sept 2011- Dr Cletis [place] Rendall  Review of Systems  The patient denies anorexia, fever, weight loss, weight gain, vision loss, decreased hearing, hoarseness, chest pain, syncope, dyspnea on  exertion, peripheral edema, prolonged cough, headaches, hemoptysis, abdominal pain, melena, hematochezia, severe indigestion/heartburn, hematuria, muscle weakness, suspicious skin lesions, difficulty walking, depression, unusual weight change, abnormal bleeding, enlarged lymph nodes, and angioedema.    Vital Signs:  Patient profile:   75 year old female Weight:      236 pounds O2 Sat:      97 % on Room air Temp:     97.9 degrees F oral Pulse rate:   76 / minute BP sitting:   134 / 82  (left arm) Cuff size:   large  Vitals  Entered By: Vernie Murders (February 12, 2010 1:32 PM)  O2 Flow:  Room air  Physical Exam  Additional Exam:  Gen. Pleasant, well-nourished, in no distress, normal affect ENT - no lesions, no post nasal drip, class 3 airway Neck: No JVD, no thyromegaly, no carotid bruits Lungs: no use of accessory muscles, no dullness to percussion, clear without rales or rhonchi  Cardiovascular: Rhythm regular, heart sounds  normal, no murmurs or gallops, no peripheral edema Musculoskeletal: No deformities, no cyanosis or clubbing      Impression & Recommendations:  Problem # 1:  SLEEP APNEA (ICD-780.57)  She had  mild obstructive sleep apnea & this has probably improved with her recent weight loss. Based on symptoms , she does not seem to require treatment at this time.She is cleared for knee surgery from our standpoint - but is recommended to use CPAP while recovering from anesthesia since these patients are known to have airway issues in the post operative situation. Her breathing should be monitored while on high doses of narcotics immediate post-operative period  Orders: Est. Patient Level III (16109)  Medications Added to Medication List This Visit: 1)  Adult Aspirin Ec Low Strength 81 Mg Tbec (Aspirin) .... Take 1 tablet by mouth once a day- hold 2)  Zoloft 50 Mg Tabs (Sertraline hcl) .... 1/2 once daily  Patient Instructions: 1)  Copy sent to: dr rendell, dr tower 2)  Cleared for surgery from sleep apnea & berathing standpoint

## 2010-03-25 NOTE — Assessment & Plan Note (Signed)
Summary: f/u sleep study///kp   Visit Type:  Follow-up Copy to:  pcp Primary Provider/Referring Provider:  Judith Part MD  CC:  Pt here for follow up after sleep study. Pt c/o waking up coughing and lossing voice x 2 days.  History of Present Illness: 75/F for pre-op evaluation of obstructive sleep apnea. she was diagnosed with this many years ago, PSG not available, & placed on CPAP. She used it for a few days but just could not keep up with the maintenance, distilled water in humidifier, masks etc so stopped using it. Does not remember whether this was helpful or not. Now rt TKR is planned by Dr Priscille Kluver. Epworth Sleepiness Score is 9/24. She has gained 15 lbs in the last few years.  She also reports painless, increased swelling of LUE x 1 day  August 26, 2009 3:13 PM  Reviewed PSG >> poor sleep efficiency- only  62 mins of sleep recorded, AHI was 12.7/h , PLM arousal index was significant - she reports pain & restlessness due to her knees but denies creepy-crawly sensations  Current Medications (verified): 1)  Toprol Xl 100 Mg  Tb24 (Metoprolol Succinate) .Marland Kitchen.. 1 By Mouth Daily 2)  Adult Aspirin Ec Low Strength 81 Mg  Tbec (Aspirin) .... Take 1 Tablet By Mouth Once A Day 3)  Furosemide 80 Mg  Tabs (Furosemide) .... Take 1 Tablet By Mouth Once A Day 4)  Klor-Con M20 20 Meq  Tbcr (Potassium Chloride Crys Cr) .... Take 1 Tablet By Mouth Two Times A Day 5)  Balanced Care   Tabs (Multiple Vitamins-Minerals) .... Take 1 Tablet By Mouth Once A Day 6)  Calcium 600   Tabs (Calcium Carbonate Tabs) .... Take 1 Tablet By Mouth Two Times A Day 7)  [employer]ons Natural Magnesium   Tabs (Magnesium Tabs) .... Take 1 Tablet By Mouth Once A Day 8)  Glucometer .... To Check Glucose Once Daily and As Needed For Dm 250.0 9)  Accu Check Aviva Strips and Softclix Lancets .... Use To Check Blood Sugar Once Daily and As Needed For Dm 10)  Fish Oil 1000 Mg Caps (Omega-3 Fatty Acids) .... Take 1 Tablet By Mouth Two  Times A Day 11)  Hydrochlorothiazide 12.5 Mg Caps (Hydrochlorothiazide) .Marland Kitchen.. 1 By Mouth Daily 12)  Tramadol Hcl 50 Mg Tabs (Tramadol Hcl) .... Take 1 Tablet By Mouth Every 4- 6 Hours As Needed  Allergies (verified): 1)  ! Buspar 2)  ! 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 (approx 20 yrs ago)  Review of Systems  The patient denies anorexia, fever, weight loss, weight gain, vision loss, decreased hearing, hoarseness, chest pain, syncope, dyspnea on exertion, peripheral edema, prolonged cough, headaches, hemoptysis, abdominal pain, melena, hematochezia, severe indigestion/heartburn, hematuria, incontinence, muscle weakness, suspicious skin lesions, difficulty walking, depression, unusual weight change, and abnormal bleeding.    Vital Signs:  Patient profile:   75 year old female Height:      66.25 inches Weight:      254 pounds BMI:     40.83 O2 Sat:      93 % on Room air Temp:     97.8 degrees F  oral Pulse rate:   67 / minute BP sitting:   140 / 88  (left arm) Cuff size:   large  Vitals Entered By: Zackery Barefoot CMA (August 26, 2009 2:58 PM)  O2 Flow:  Room air CC: Pt here for follow up after sleep study. Pt c/o waking up coughing, lossing voice x 2 days Comments Medications reviewed with patient Verified contact number and pharmacy with patient Zackery Barefoot CMA  August 26, 2009 2:59 PM    Physical Exam  Additional Exam:  Gen. Pleasant, well-nourished, in no distress, normal affect ENT - no lesions, no post nasal drip, class 3 airway Neck: No JVD, no thyromegaly, no carotid  bruits Lungs: no use of accessory muscles, no dullness to percussion, clear without rales or rhonchi  Cardiovascular: Rhythm regular, heart sounds  normal, no murmurs or gallops, no peripheral edema Musculoskeletal: No deformities, no cyanosis or clubbing      Impression & Recommendations:  Problem # 1:  SLEEP APNEA (ICD-780.57)  She has at least mild obstructive sleep apnea & has had a poor experience with CPAP in the past. Based on symptoms , she does not seem to require treatment at this time. Hoepfully she wil be able to lose wt after her knee surgery - she is cleared for knee surgery from our standpoint - but is recommended to use CPAP while recovering from anesthesia since these patients are known to have airway issues in the post operative situation. Her narcotic dosages should be carefully titrated.  Orders: Est. Patient Level III (21308)  Problem # 2:  LEG PAIN (ICD-729.5) If symptoms persist post knee surgery, puruse empiric treatment for restless legs.  Medications Added to Medication List This Visit: 1)  Fish Oil 1000 Mg Caps (Omega-3 fatty acids) .... Take 1 tablet by mouth two times a day 2)  Tramadol Hcl 50 Mg Tabs (Tramadol hcl) .... Take 1 tablet by mouth every 4- 6 hours as needed  Patient Instructions: 1)  Please schedule a follow-up appointment in 6 months. 2)  OK to proceed with kne surgery 3)  Pl inform anesthesiologist that you have mild obstructive sleep apnea . 4)  Copy sent to: Dr Brynda Greathouse - Gaylord Shih, Dr Milinda Antis

## 2010-03-25 NOTE — Progress Notes (Signed)
Summary: MEDS CAUSING DIZZNESS-LISINOPRIL 5MG   Phone Note Call from Patient   Summary of Call: PT CALLED...SHE IS PRESENTLY TAKING LISINOPRIL 5MG . 1 WEEK.Marland KitchenSAYS SHE IS HAVING DIZZNESS.  NOT ABLE TO CONTINUE TAKING THIS MEDICATION.Marland KitchenCALL BACK # [phone]--[facility].Marland KitchenMarland KitchenDaine Gip  March 10, 2008 10:54 AM Initial call taken by: Daine Gip,  March 10, 2008 10:54 AM  Follow-up for Phone Call        I want her to try 1/2 pill daily -- if dizziness still persists , stop it and let me know (will not continue if dizzy on 1/2 dose) if able to check pressure at home or at drugstore/etc - this may also be useful  Follow-up by: Judith Part MD,  March 10, 2008 1:22 PM  Additional Follow-up for Phone Call Additional follow up Details #1::        Advised patient.  ......................................................Marland KitchenNatasha Chavers March 10, 2008 2:50 PM     New/Updated Medications: LISINOPRIL 5 MG TABS (LISINOPRIL) 1/2  by mouth once daily

## 2010-03-25 NOTE — Progress Notes (Signed)
Summary: cardizem refill  Phone Note Refill Request Message from:  Fax from Pharmacy on September 10, 2007 10:58 AM  Refills Requested: Medication #1:  CARDIZEM CD 360 MG  CP24 Take 1 capsule by mouth once a day Initial call taken by: Liane Comber,  September 10, 2007 10:58 AM      Prescriptions: CARDIZEM CD 360 MG  CP24 (DILTIAZEM HCL COATED BEADS) Take 1 capsule by mouth once a day  #30 x 6   Entered and Authorized by:   Liane Comber   Signed by:   Liane Comber on 09/10/2007   Method used:   Historical   RxID:   [id number]

## 2010-03-25 NOTE — Assessment & Plan Note (Signed)
Summary: F/U AFTER LABS / LFW   Vital Signs:  Patient profile:   75 year old female Weight:      250 pounds BMI:     40.19 Temp:     98.3 degrees F oral Pulse rate:   68 / minute Pulse rhythm:   regular BP sitting:   130 / 80  (left arm) Cuff size:   regular  Vitals Entered By: Lowella Petties CMA (December 17, 2008 3:04 PM) CC: 3 month follow up   History of Present Illness: here for f/u of HTN and chol and DM   wt is down 6 lb is happy about that  is trying to watch her diet   bp is stable 130/80 unable to tol ace in past  is on asa   lipids stable with trig 81/ HDL 47 better/ and LDL 137 (same) is still over goal diet is overall about the same   DM - stable with AIC 6.2  sugar is good - stays in 90s in ams  does not check in afternoons  opthy -- eye doctor appt last week - Dr Nelle Don - no retinopathy   headache - is ongoing  still feels like there is pressure under her eye , almost itchy sensation  some pain on side of head too - just on R side  overall severe at times- and it makes her cry due to frustration  had an eye exam - no vision trouble except mild cataracts  june 21 -- abx from endodontist for sinus infection  then got px from here for nasal spray had very slt elevated sed rate- she did not f/u to have that re checked  had a CT that was ok  saw Dr Willeen Cass in august -- ENT - no infection  given nambuterone and also cyclobenzaprine --and was ref to neurologist ( has appt coming up)  those meds did help also -- but ran out of them        Allergies: 1)  ! Buspar 2)  Lisinopril (Lisinopril)  Past History:  Past Medical History: Last updated: 02/27/2008 Allergic rhinitis Anxiety Depression Hyperlipidemia Hypertension obesity compulsive overeating  OA knees with injections  DM2  Past Surgical History: Last updated: 12/21/2006 Hysterectomy- partial, fibroids Lumpectomy, right x1 (1989),   left x 2 (1970's, 1990) Colonoscopy-  diverticulosis (12/2000) Pelvic US (2000, 2001) Dexa- normal (04/2000) Cardiac cath- normal per pt (2001) Sleep study (06/2003) Colonoscopy- diverticulosis, hemorrhoids (10/2004) Adenosine cardiolite- low risk (02/2006)  Family History: Last updated: 2008-06-14 Father: died age 63- CVA Mother: HTN (has a lot of animosity in her relationship with her mother) Siblings: 2 brothers- 1, prostate cancer. 1, Hep C. 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 Eyes:  Denies blurring and eye irritation. ENT:  Complains of postnasal drainage and sinus pressure. CV:  Denies chest pain or discomfort, lightheadness, palpitations, and shortness of breath with exertion. Resp:  Denies cough and wheezing. GI:  Denies abdominal pain, change in bowel habits, nausea, and vomiting. MS:  Denies joint pain and muscle weakness. Derm:  Denies itching, poor wound healing, and rash. Neuro:  Complains of  headaches; denies difficulty with concentration, disturbances in coordination, numbness, tingling, visual disturbances, and weakness. Psych:  mood is fair . Endo:  Denies cold intolerance, excessive thirst, excessive urination, and heat intolerance. Heme:  Denies abnormal bruising and bleeding.  Physical Exam  General:  Well-developed,well-nourished,in no acute distress; alert,appropriate and cooperative throughout examination Head:  normocephalic, atraumatic, and no abnormalities observed.  some tenderness R temple and maxillary sinus Eyes:  vision grossly intact, pupils equal, pupils round, and pupils reactive to light.  no  conjunctival pallor, injection or icterus  Ears:  R ear normal and L ear normal.   Nose:  no nasal discharge.   Mouth:  pharynx pink and moist.   Neck:  supple with full rom and no masses or thyromegally, no JVD or carotid bruit  Lungs:  Normal respiratory effort, chest expands symmetrically. Lungs are clear to auscultation, no crackles or wheezes. Heart:  Normal rate and regular rhythm. S1 and S2 normal without gallop, murmur, click, rub or other extra sounds. Msk:  No deformity or scoliosis noted of thoracic or lumbar spine.   generally poor rom knees with slow ambulation Pulses:  R and L carotid,radial,femoral,dorsalis pedis and posterior tibial pulses are full and equal bilaterally Extremities:  No clubbing, cyanosis, edema, or deformity noted with normal full range of motion of all joints.   Neurologic:  sensation intact to light touch, gait normal, and DTRs symmetrical and normal.  cranial nerves II-XII intact and strength normal in all extremities.   Skin:  Intact without suspicious lesions or rashes Cervical Nodes:  No lymphadenopathy noted Psych:  normal affect, talkative and pleasant   Diabetes Management Exam:    Foot Exam (with socks and/or shoes not present):       Sensory-Pinprick/Light touch:          Left medial foot (L-4): normal          Left dorsal foot (L-5): normal          Left lateral foot (S-1): normal          Right medial foot (L-4): normal          Right dorsal foot (L-5): normal          Right lateral foot (S-1): normal       Sensory-Monofilament:          Left foot: normal          Right foot: normal       Inspection:          Left foot: normal          Right foot: normal       Nails:          Left foot: normal          Right foot: normal    Eye Exam:       Eye Exam done elsewhere          Date: 11/21/2008          Results: normal          Done by: Dr Nelle Don   Impression & Recommendations:  Problem # 1:  HEADACHE (ICD-784.0) Assessment  Unchanged ongoing with neg ent w/u and sinus CT mildly elevated ESR- will re check that today (no vision change or other signs of temporal arteritis) f/u with neurol as planned next mo  Her updated medication list for this problem includes:    Toprol Xl 100 Mg Tb24 (Metoprolol succinate) .Marland Kitchen... 1 by mouth daily  Adult Aspirin Ec Low Strength 81 Mg Tbec (Aspirin) .Marland Kitchen... Take 1 tablet by mouth once a day    Aleve 220 Mg Tabs (Naproxen sodium) .Marland Kitchen... 1-2 by mouth two times a day as needed gout -- do not use for more than 2-3 days    Nabumetone 500 Mg Tabs (Nabumetone) .Marland Kitchen... Take one to two two times a day as needed for headache  Orders: Venipuncture (04540) TLB-Sedimentation Rate (ESR) (85652-ESR)  Problem # 2:  DIABETES MELLITUS, TYPE II, MILD (ICD-250.00) with stable AIC of 6.2 asked to watch diet and keep working on wt loss  non tol of ace/arb ref flu shot  need tighter lipid control  Her updated medication list for this problem includes:    Adult Aspirin Ec Low Strength 81 Mg Tbec (Aspirin) .Marland Kitchen... Take 1 tablet by mouth once a day  Problem # 3:  HYPERTENSION (ICD-401.9) Assessment: Unchanged  bp is stable and at goal now on current meds non tol of ace/arb Her updated medication list for this problem includes:    Toprol Xl 100 Mg Tb24 (Metoprolol succinate) .Marland Kitchen... 1 by mouth daily    Furosemide 80 Mg Tabs (Furosemide) .Marland Kitchen... Take 1 tablet by mouth once a day    Cardizem Cd 360 Mg Cp24 (Diltiazem hcl coated beads) .Marland Kitchen... Take 1 capsule by mouth once a day  BP today: 130/80 Prior BP: 140/90 (10/02/2008)  Labs Reviewed: K+: 3.9 (09/03/2008) Creat: : 0.9 (09/03/2008)   Chol: 202 (12/10/2008)   HDL: 47.40 (12/10/2008)   LDL: 136 (06/02/2008)   TG: 81.0 (12/10/2008)  Problem # 4:  HYPERLIPIDEMIA (ICD-272.4) Assessment: Unchanged  LDL too high- disc goal of LDL under 70 in DM pt  rev sat fats in diet  start zocor- update if side eff  check lab in 6 wk  Her updated medication list  for this problem includes:    Zocor 20 Mg Tabs (Simvastatin) .Marland Kitchen... 1 by mouth once daily  Labs Reviewed: SGOT: 23 (12/10/2008)   SGPT: 29 (12/10/2008)   HDL:47.40 (12/10/2008), 39.50 (06/02/2008)  LDL:136 (06/02/2008), 134 (12/13/2007)  Chol:202 (12/10/2008), 199 (06/02/2008)  Trig:81.0 (12/10/2008), 117.0 (06/02/2008)  Complete Medication List: 1)  Toprol Xl 100 Mg Tb24 (Metoprolol succinate) .Marland Kitchen.. 1 by mouth daily 2)  Adult Aspirin Ec Low Strength 81 Mg Tbec (Aspirin) .... Take 1 tablet by mouth once a day 3)  Furosemide 80 Mg Tabs (Furosemide) .... Take 1 tablet by mouth once a day 4)  Cardizem Cd 360 Mg Cp24 (Diltiazem hcl coated beads) .... Take 1 capsule by mouth once a day 5)  Klor-con M20 20 Meq Tbcr (Potassium chloride crys cr) .... Take 1 tablet by mouth two times a day 6)  Alprazolam 0.25 Mg Tabs (Alprazolam) .... Take 1 tablet by mouth once daily as needed severe anxiety 7)  Balanced Care Tabs (Multiple vitamins-minerals) .... Take 1 tablet by mouth once a day 8)  Calcium 600 Tabs (Calcium carbonate tabs) .... Take 1 tablet by mouth two times a day 9)  [employer] Natural Magnesium Tabs (Magnesium tabs) .... Take 1 tablet by mouth once a day 10)  Glucometer  .... To check glucose once daily and as needed for dm 250.0 11)  Accu Check Aviva Strips and Softclix Lancets  .... Use to check blood sugar once daily and as needed for dm 12)  Aleve 220 Mg Tabs (Naproxen sodium) .Marland Kitchen.. 1-2 by mouth two times a day as needed gout -- do not use for more than 2-3 days 13)  Cyclobenzaprine  Hcl 10 Mg Tabs (Cyclobenzaprine hcl) .... Take one by mouth every 8 hours as needed for neck pain 14)  Nabumetone 500 Mg Tabs (Nabumetone) .... Take one to two two times a day as needed for headache 15)  Zocor 20 Mg Tabs (Simvastatin) .Marland Kitchen.. 1 by mouth once daily 16)  Fish Oil 1000 Mg Caps (Omega-3 fatty acids) .Marland Kitchen.. 1 by mouth once daily  Contraindications/Deferment of Procedures/Staging:    Test/Procedure: FLU  VAX    Reason for deferment: patient declined    Patient Instructions: 1)  I am checking sed rate again for headache and will update you with result  2)  follow up with neurologist as planned next month 3)  start zocor 20 mg once daily in the evening - to control cholesterol  4)  you can raise your HDL (good cholesterol) by increasing exercise and eating omega 3 fatty acid supplement like fish oil or flax seed oil over the counter 5)  you can lower LDL (bad cholesterol) by limiting saturated fats in diet like red meat, fried foods, egg yolks, fatty breakfast meats, high fat dairy products and shellfish  6)  schedule fasting labs in 6 weeks for lipid/ast/alt 272 - to see how it is working 7)  if any side effects like muscle pain - please let me know  8)  go back to aspirin one per day  9)  try to get 1000 international units of vitamin D daily  Prescriptions: ZOCOR 20 MG TABS (SIMVASTATIN) 1 by mouth once daily  #30 x 5   Entered and Authorized by:   Judith Part MD   Signed by:   Judith Part MD on 12/17/2008   Method used:   Print then Give to Patient   RxID:   [id number]   Prior Medications (reviewed today): TOPROL XL 100 MG  TB24 (METOPROLOL SUCCINATE) 1 by mouth daily ADULT ASPIRIN EC LOW STRENGTH 81 MG  TBEC (ASPIRIN) Take 1 tablet by mouth once a day FUROSEMIDE 80 MG  TABS (FUROSEMIDE) Take 1 tablet by mouth once a day CARDIZEM CD 360 MG  CP24 (DILTIAZEM HCL COATED BEADS) Take 1 capsule by mouth once a day KLOR-CON M20 20 MEQ  TBCR (POTASSIUM CHLORIDE CRYS CR) Take 1 tablet by mouth two times a day ALPRAZOLAM 0.25 MG  TABS (ALPRAZOLAM) Take 1 tablet by mouth once daily as needed severe anxiety BALANCED CARE   TABS (MULTIPLE VITAMINS-MINERALS) Take 1 tablet by mouth once a day CALCIUM 600   TABS (CALCIUM CARBONATE TABS) Take 1 tablet by mouth two times a day ALBERTSONS NATURAL MAGNESIUM   TABS (MAGNESIUM TABS) Take 1 tablet by mouth once a day GLUCOMETER () to check  glucose once daily and as needed for DM 250.0 ACCU CHECK AVIVA STRIPS AND SOFTCLIX LANCETS () use to check blood sugar once daily and as needed for DM ALEVE 220 MG TABS (NAPROXEN SODIUM) 1-2 by mouth two times a day as needed gout -- do not use for more than 2-3 days CYCLOBENZAPRINE HCL 10 MG TABS (CYCLOBENZAPRINE HCL) take one by mouth every 8 hours as needed for neck pain NABUMETONE 500 MG TABS (NABUMETONE) take one to two two times a day as needed for headache ZOCOR 20 MG TABS (SIMVASTATIN) 1 by mouth once daily FISH OIL 1000 MG CAPS (OMEGA-3 FATTY ACIDS) 1 by mouth once daily Current Allergies: ! BUSPAR LISINOPRIL (LISINOPRIL)

## 2010-03-25 NOTE — Letter (Signed)
Summary: Request for Pre-Op Clearance from [facility]  Request for Pre-Op Clearance from [facility]   Imported By: Beau Fanny 09/02/2009 09:31:11  _____________________________________________________________________  External Attachment:    Type:   Image     Comment:   External Document

## 2010-03-25 NOTE — Consult Note (Signed)
Summary: LEFT BREAST CORE BIOPSY / DR. Kathie Rhodes. [facility]   LEFT BREAST CORE BIOPSY / DR. Kathie Rhodes. G[facility]   Imported By: Carin Primrose 03/16/2007 08:58:20  _____________________________________________________________________  External Attachment:    Type:   Image     Comment:   External Document

## 2010-03-25 NOTE — Assessment & Plan Note (Signed)
Summary: SURGERY CLEARANCE- SLEEP APNEA//kp   Visit Type:  Initial Consult Copy to:  pcp Primary Provider/Referring Provider:  Judith Part MD  CC:  Pt here for sleep consult. Pt needs surgical clearance for right total knee replacement.  History of Present Illness: 75/F for pre-op evaluation of obstructive sleep apnea. she was diagnosed with this many years ago, PSG not available, & placed on CPAP. She used it for a few days but just could not keep up with the maintenance, distilled water in humidifier, masks etc so stopped using it. Does not remember whether this was helpful or not. Now rt TKR is planned by Dr Priscille Kluver. Epworth Sleepiness Score is 9/24. Bedtime is 2300- MN, sleep latency is a few mins, TV stays on through the night, has 1- 2 BR visits without post void sleep latency, wakes up at 8 am feeling refreshed, no headaches or dryness. She has gained 15 lbs in the last few years.  There is no history suggestive of cataplexy, sleep paralysis or parasomnias  She also reports painless, increasd swelling of LUE x 1 day  Preventive Screening-Counseling & Management  Alcohol-Tobacco     Alcohol drinks/day: <1     Alcohol type: wine     Smoking Status: quit     Packs/Day: 0.5     Year Started: 1960     Year Quit: 1991   History of Present Illness: Sleep Apnea  What time do you typically go to bed?(between what hours): 11:00pm-12:00am   How many times during the night do you wake up? 2 x for bathroom  What time do you get out of bed to start your day? 7-8am  Do you drive or operate heavy machinery in your occupation? n/a  How much has your weight changed (up or down) over the past two years? (in pounds): 15up  Have you ever had a sleep study before?  If yes,when and where: Yes-Don't remember  Do you currently use CPAP ? If so , at what pressure? no/have used one  Do you wear oxygen at any time? If yes, how many liters per minute? no Allergies: 1)  ! Buspar 2)  ! *  Vit C 3)  Lisinopril (Lisinopril)  Past History:  Past Medical History: Last updated: 02/27/2008 Allergic rhinitis Anxiety Depression Hyperlipidemia Hypertension obesity compulsive overeating  OA knees with injections  DM2  Past Surgical History: Last updated: 12/21/2006 Hysterectomy- partial, fibroids Lumpectomy, right x1 (1989),   left x 2 (1970's, 1990) Colonoscopy- diverticulosis (12/2000) Pelvic US (2000, 2001) Dexa- normal (04/2000) Cardiac cath- normal per pt (2001) Sleep study (06/2003) Colonoscopy- diverticulosis, hemorrhoids (10/2004) Adenosine cardiolite- low risk (02/2006)  Family History: Last updated: 07/02/08 Father: died age 35- CVA Mother: HTN (has a lot of animosity in her relationship with her mother) Siblings: 2 brothers- 1, prostate cancer. 1, Hep C. 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 (approx 20 yrs ago) Packs/Day:  0.5 Alcohol drinks/day:  <1  Review of Systems       The patient complains of weight change, anxiety, hand/feet swelling, joint stiffness  or pain, and rash.  The patient denies shortness of breath with activity, shortness of breath at rest, productive cough, non-productive cough, coughing up blood, chest pain, irregular heartbeats, acid heartburn, indigestion, loss of appetite, abdominal pain, difficulty swallowing, sore throat, tooth/dental problems, headaches, nasal congestion/difficulty breathing through nose, sneezing, itching, ear ache, depression, change in color of mucus,  and fever.    Vital Signs:  Patient profile:   75 year old female Height:      66.25 inches Weight:      258.50 pounds O2 Sat:      95 % on Room air Temp:     98.0 degrees F oral Pulse rate:   82 / minute BP sitting:   140 / 80  (left arm) Cuff size:   large  Vitals Entered By: Zackery Barefoot CMA (Jun 23, 2009 9:49 AM)  O2 Flow:  Room air CC: Pt here for sleep consult. Pt needs surgical clearance for right total knee replacement Comments Medications reviewed with patient Verified contact number and pharmacy with patient Zackery Barefoot CMA  Jun 23, 2009 9:50 AM    Physical Exam  Additional Exam:  Gen. Pleasant, well-nourished, in no distress, normal affect ENT - no lesions, no post nasal drip, class 3 airway Neck: No JVD, no thyromegaly, no carotid bruits Lungs: no use of accessory muscles, no dullness to percussion, clear without rales or rhonchi  Cardiovascular: Rhythm regular, heart sounds  normal, no murmurs or gallops, no peripheral edema Abdomen: soft and non-tender, no hepatosplenomegaly, BS normal. Musculoskeletal: No deformities, no cyanosis or clubbing Neuro:  alert, non focal     Impression & Recommendations:  Problem # 1:  SLEEP APNEA (ICD-780.57) The pathophysiology of obstructive sleep apnea, it's cardiovascular consequences and modes of treatment including CPAP were discussed with the patient in great detail.  Proceed with split study  - she seems to be more agreeable to using CPAP if needed. If mild, she would prefer oral appliance. Orders: Consultation Level III (16109) Sleep Disorder Referral (Sleep Disorder)  Patient Instructions: 1)  Copy sent to: Dr Milinda Antis 2)  Please schedule a follow-up appointment in 2 weeks after sleep study     Appended Document: SURGERY CLEARANCE- SLEEP APNEA//kp arrange FU OV after sleep study pl  Appended Document: SURGERY CLEARANCE- SLEEP APNEA//kp LMOMTCB x 1   Appended Document: SURGERY CLEARANCE- SLEEP  APNEA//kp done

## 2010-03-25 NOTE — Assessment & Plan Note (Signed)
Summary: F/U/CLE   Vital Signs:  Patient Profile:   75 Years Old Female Weight:      269 pounds Temp:     97.8 degrees F oral Pulse rate:   80 / minute Pulse rhythm:   regular BP sitting:   140 / 90  (left arm) Cuff size:   large  Vitals Entered By: Liane Comber (February 27, 2008 11:37 AM)                 Chief Complaint:  f/u and refill toprolol.  History of Present Illness: is having hard time with knee pain- unable to walk any distance  needs knee replacement - one at a time (has not scheduled it yet)  the other am - she fell as well - had hard time getting up  knees hurt more   did order some dvd for chair exercises   still struggling with weight problem  eating is a big struggle- loves to eat and cook does not think counseling will help  needs discipline and determination  she knows she has to do it herself  some stress - daughter has been here- is leaving tomorrow   labs returned AIC 6.2  no DM in family  no inc thirst  does urinate a lot with furosemide   cholesterol is high with LDL of 134 , and HDL 46 does eat fair amount of fried foods and fast foods - knows this is harmful  wants to try to eat some more salads        Current Allergies (reviewed today): ! BUSPAR  Past Medical History:    Allergic rhinitis    Anxiety    Depression    Hyperlipidemia    Hypertension    obesity    compulsive overeating     OA knees with injections     DM2  Past Surgical History:    Reviewed history from 12/21/2006 and no changes required:       Hysterectomy- partial, fibroids       Lumpectomy, right x1 (1989),   left x 2 (1970's, 1990)       Colonoscopy- diverticulosis (12/2000)       Pelvic US (2000, 2001)       Dexa- normal (04/2000)       Cardiac cath- normal per pt (2001)       Sleep study (06/2003)       Colonoscopy- diverticulosis, hemorrhoids (10/2004)       Adenosine cardiolite- low risk (02/2006)   Family History:    Reviewed history  from 10/10/2007 and no changes required:       Father: died age 34- CVA       Mother: HTN (has a lot of animosity in her relationship with her mother)       Siblings: 2 brothers- 1, prostate cancer. 1, Hep C. 1 sister, breast cancer       brother is addicted to drugs          Social History:    Reviewed history from 09/06/2007 and no changes required:       Marital Status: widow       Children: 1 daughter       Occupation: retired principal       non smoker       no alcohol     Review of Systems  General      Complains of fatigue.      Denies fever, loss of appetite,  malaise, and weight loss.  Eyes      Denies blurring, eye pain, and itching.  CV      Complains of swelling of feet.      Denies chest pain or discomfort, palpitations, and shortness of breath with exertion.  Resp      Denies cough, shortness of breath, and wheezing.  GI      Denies abdominal pain and change in bowel habits.  GU      Complains of urinary frequency.      Denies urinary hesitancy.  Derm      Denies itching and rash.  Neuro      Complains of tingling.      Denies weakness.  Psych      is frustrated with weight  Endo      Denies cold intolerance, excessive thirst, and heat intolerance.   Physical Exam  General:     overweight but generally well appearing  Head:     normocephalic, atraumatic, and no abnormalities observed.   Eyes:     vision grossly intact, pupils equal, pupils round, and pupils reactive to light.   Mouth:     pharynx pink and moist.   Neck:     supple with full rom and no masses or thyromegally, no JVD or carotid bruit  Lungs:     Normal respiratory effort, chest expands symmetrically. Lungs are clear to auscultation, no crackles or wheezes. Heart:     Normal rate and regular rhythm. S1 and S2 normal without gallop, murmur, click, rub or other extra sounds. Abdomen:     soft, non-tender, and normal bowel sounds.   Msk:     No deformity or scoliosis  noted of thoracic or lumbar spine.   Pulses:     R and L carotid,radial,femoral,dorsalis pedis and posterior tibial pulses are full and equal bilaterally Extremities:     No clubbing, cyanosis, edema, or deformity noted with normal full range of motion of all joints.   Neurologic:     sensation intact to light touch, gait normal, and DTRs symmetrical and normal.   Skin:     Intact without suspicious lesions or rashes Cervical Nodes:     No lymphadenopathy noted Psych:     talkative and animated  acts very frustrated over wt - almost to the point of tears   Diabetes Management Exam:    Foot Exam (with socks and/or shoes not present):       Sensory-Pinprick/Light touch:          Left medial foot (L-4): normal          Left dorsal foot (L-5): normal          Left lateral foot (S-1): normal          Right medial foot (L-4): normal          Right dorsal foot (L-5): normal          Right lateral foot (S-1): normal       Sensory-Monofilament:          Left foot: normal          Right foot: normal       Inspection:          Left foot: normal          Right foot: normal       Nails:          Left foot: normal  Right foot: normal    Eye Exam:       Eye Exam not due    Impression & Recommendations:  Problem # 1:  DIABETES MELLITUS, TYPE II, MILD (ICD-250.00) Assessment: New new / mild with some tingling in feet disc role of obesity ref to DM teach and give px for glucose test equipt  hopefully this will motivate for better habits  add ace for renal protection f/u in 3-4 weeks  Her updated medication list for this problem includes:    Adult Aspirin Ec Low Strength 81 Mg Tbec (Aspirin) .Marland Kitchen... Take 1 tablet by mouth once a day    Lisinopril 5 Mg Tabs (Lisinopril) .Marland Kitchen... 1 by mouth once daily  Orders: Diabetic Clinic Referral (Diabetic)   Problem # 2:  HYPERTENSION (ICD-401.9) Assessment: Deteriorated bp not optimally controlled at this time add ace for this and  renal protection  f/u 3-4 weeks update if any side eff or problems Her updated medication list for this problem includes:    Toprol Xl 100 Mg Tb24 (Metoprolol succinate) .Marland Kitchen... 1 by mouth daily    Furosemide 80 Mg Tabs (Furosemide) .Marland Kitchen... Take 1 tablet by mouth once a day    Cardizem Cd 360 Mg Cp24 (Diltiazem hcl coated beads) .Marland Kitchen... Take 1 capsule by mouth once a day    Lisinopril 5 Mg Tabs (Lisinopril) .Marland Kitchen... 1 by mouth once daily   Problem # 3:  HYPERLIPIDEMIA (ICD-272.4) Assessment: Deteriorated not opt controlled  disc sat fats in diet - must stop fast foods  will disc adding statin to meds - pt is resisitant so far  Labs Reviewed: Chol: 194 (12/13/2007)   HDL: 46.1 (12/13/2007)   LDL: 134 (12/13/2007)   TG: 70 (12/13/2007) SGOT: 26 (12/13/2007)   SGPT: 28 (12/13/2007)   Problem # 4:  OBESITY (ICD-278.00) Assessment: Deteriorated spent good deal of time talking about obesity and risk to future health and well being  pt aware of these risks/ and also link to DM 2 she is aware wt loss is most imp part of DM tx  disc at lengh her emotional eating problems- she does not believe counselor would help has good support  seems determined to change  disc plan of decreased calories and increased exercise   Complete Medication List: 1)  Toprol Xl 100 Mg Tb24 (Metoprolol succinate) .Marland Kitchen.. 1 by mouth daily 2)  Adult Aspirin Ec Low Strength 81 Mg Tbec (Aspirin) .... Take 1 tablet by mouth once a day 3)  Furosemide 80 Mg Tabs (Furosemide) .... Take 1 tablet by mouth once a day 4)  Cardizem Cd 360 Mg Cp24 (Diltiazem hcl coated beads) .... Take 1 capsule by mouth once a day 5)  Klor-con M20 20 Meq Tbcr (Potassium chloride crys cr) .... Take 1 tablet by mouth two times a day 6)  Alprazolam 0.25 Mg Tabs (Alprazolam) .... Take 1 tablet by mouth once daily as needed severe anxiety 7)  Balanced Care Tabs (Multiple vitamins-minerals) .... Take 1 tablet by mouth once a day 8)  Calcium 600 Tabs (Calcium  carbonate tabs) .... Take 1 tablet by mouth two times a day 9)  [employer] Natural Magnesium Tabs (Magnesium tabs) .... Take 1 tablet by mouth once a day 10)  Ketoconazole 2 % Crea (Ketoconazole) .... Apply twice daily to affected area 11)  Meclizine Hcl 25 Mg Tabs (Meclizine hcl) .... 1/2 to 1 by mouth up to three times a day as needed dizziness 12)  Lisinopril 5 Mg Tabs (Lisinopril) .Marland KitchenMarland KitchenMarland Kitchen  1 by mouth once daily 13)  Glucometer  .... To check glucose once daily and as needed for dm 250.0 14)  Glucose Test Strips and Lancets  .... To check glucose once daily and as needed for dm 250.0   Patient Instructions: 1)  we will refer you to diabetic teaching at check out  2)  save your diabetic test equiptment px for your first class 3)  start lisinopril for blood pressure - update me if any side effects or problems  4)  follow up with me in 6 weeks to check blood pressure  5)  start watching diet closely for sugar   Prescriptions: GLUCOSE TEST STRIPS AND LANCETS to check glucose once daily and as needed for DM 250.0  #100 each x 0   Entered and Authorized by:   Judith Part MD   Signed by:   Judith Part MD on 02/27/2008   Method used:   Print then Give to Patient   RxID:   [id number] GLUCOMETER to check glucose once daily and as needed for DM 250.0  #1 x 0   Entered and Authorized by:   Judith Part MD   Signed by:   Judith Part MD on 02/27/2008   Method used:   Print then Give to Patient   RxID:   [id number] LISINOPRIL 5 MG TABS (LISINOPRIL) 1 by mouth once daily  #30 x 11   Entered and Authorized by:   Judith Part MD   Signed by:   Judith Part MD on 02/27/2008   Method used:   Print then Give to Patient   RxID:   [id number] TOPROL XL 100 MG  TB24 (METOPROLOL SUCCINATE) 1 by mouth daily  #30 x 11   Entered and Authorized by:   Judith Part MD   Signed by:   Judith Part MD on 02/27/2008   Method used:   Print then Give to Patient   RxID:    [id number]  ]

## 2010-03-31 NOTE — Assessment & Plan Note (Signed)
Summary: SURGICAL CLEARANCE/CLE   Vital Signs:  Patient profile:   75 year old female Height:      66.25 inches Weight:      235.50 pounds BMI:     37.86 Temp:     98.4 degrees F oral Pulse rate:   60 / minute Pulse rhythm:   slightly irregular BP sitting:   158 / 88  (left arm) Cuff size:   large  Vitals Entered By: Lewanda Rife LPN (March 17, 2010 3:48 PM)  Serial Vital Signs/Assessments:  Time      Position  BP       Pulse  Resp  Temp     By                     125/70                         Judith Part MD  CC: Surgical clearance Comments Pt said BP might be up due to her rushing to get here.   History of Present Illness: here for surgical clearance for 2nd knee replacement this will be L one   R one is doing good - favoring that is wearing that out  still in PT   will go back to [facility] for rehab has her old EKGs to use   last EKG from fall - was ok  nsr - normal with nl rate   did well with anethesia - no problems  was cleared by Dr Vassie Loll  not many drug allergies   is on cpap    is excited to get that over with -- will be 16[address] or 25 [address] in feb   brought last EKG from [facility]- nl   bp is up on first check today  wt is stable-- keeping it off  exercising and keeping it it off       Allergies: 1)  ! Buspar 2)  ! * Vit C 3)  Lisinopril (Lisinopril)  Past History:  Past Medical History: Last updated: 2010-02-15 Allergic rhinitis Anxiety Depression Hyperlipidemia Hypertension obesity compulsive overeating  OA knees with injections  DM2 gout  Past Surgical History: Last updated: 02/12/2010 Hysterectomy- partial, fibroids Lumpectomy, right x1 (1989),   left x 2 (1970's, 1990) Colonoscopy- diverticulosis (12/2000) Pelvic US (2000, 2001) Dexa- normal (04/2000) Cardiac cath- normal per pt (2001) Sleep study (06/2003) Colonoscopy- diverticulosis, hemorrhoids (10/2004) Adenosine cardiolite- low risk (02/2006) Rt knee  replaced Sept 2011- Dr Arcola Jansky  Family History: Last updated: 15-Feb-2010 Father: died age 85- CVA Mother: HTN (has a lot of animosity in her relationship with her mother) Siblings: 2 brothers- 1, prostate cancer. 1, Hep C. 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 (approx 20 yrs ago)  Risk Factors: Alcohol Use: <1 (06/23/2009)  Risk Factors: Smoking Status: quit (06/23/2009) Packs/Day: 0.5 (06/23/2009)  Review of Systems General:  Denies fatigue, loss of appetite, and malaise. Eyes:  Denies blurring. CV:  Complains of swelling of feet; denies chest pain or discomfort, palpitations, and shortness of breath with exertion. Resp:  Denies cough and wheezing. GI:  Denies diarrhea, indigestion, and nausea. GU:  Denies dysuria and urinary frequency. MS:  Complains of joint pain and stiffness; denies joint redness and joint swelling. Derm:  Denies itching, lesion(s), poor wound healing, and rash. Neuro:  Denies headaches, numbness, and tingling. Psych:  mood is getting better . Endo:  Denies cold intolerance, excessive thirst, excessive urination, and heat intolerance. Heme:  Denies abnormal bruising and bleeding.  Physical Exam  General:  obese and well appearing  Head:  normocephalic, atraumatic, and no abnormalities observed.   Eyes:  vision grossly intact, pupils equal, pupils round, and pupils reactive to light.  no conjunctival pallor, injection or icterus  Ears:  R ear normal and L ear normal.   Neck:  supple with full rom and no masses or thyromegally, no JVD or carotid bruit  Chest Wall:  No deformities, masses, or tenderness noted. Lungs:  Normal respiratory effort, chest expands  symmetrically. Lungs are clear to auscultation, no crackles or wheezes. Heart:  Normal rate and regular rhythm. S1 and S2 normal without gallop, murmur, click, rub or other extra sounds. Abdomen:  Bowel sounds positive,abdomen soft and non-tender without masses, organomegaly or hernias noted. no renal bruits  Msk:  poor rom of L leg   Skin:  Intact without suspicious lesions or rashes Cervical Nodes:  No lymphadenopathy noted   Impression & Recommendations:  Problem # 1:  PREOPERATIVE EXAMINATION (ICD-V72.84) Assessment Comment Only no restrictions for surgery  rev chronic med probs and intolerances glad she is working on wt loss rev EKG will send note to her surgeon  HTN in good control  Problem # 2:  HYPERTENSION (ICD-401.9)  Her updated medication list for this problem includes:    Toprol Xl 100 Mg Tb24 (Metoprolol succinate) .Marland Kitchen... 1 by mouth daily    Furosemide 80 Mg Tabs (Furosemide) .Marland Kitchen... Take 1 tablet by mouth once a day  BP today: 158/88-- re check 125/70 at rest - doing well  Prior BP: 134/82 (02/12/2010)  Labs Reviewed: K+: 3.5 (12/09/2009) Creat: : 0.9 (12/09/2009)   Chol: 200 (12/09/2009)   HDL: 39.20 (12/09/2009)   LDL: 136 (06/02/2008)   TG: 202.0 (12/09/2009)  Complete Medication List: 1)  Toprol Xl 100 Mg Tb24 (Metoprolol succinate) .Marland Kitchen.. 1 by mouth daily 2)  Adult Aspirin Ec Low Strength 81 Mg Tbec (Aspirin) .... Take 1 tablet by mouth once a day- hold 3)  Furosemide 80 Mg Tabs (Furosemide) .... Take 1 tablet by mouth once a day 4)  Klor-con M20 20 Meq Tbcr (Potassium chloride crys cr) .... Take 1 tablet by mouth two times a day 5)  Balanced Care Tabs (Multiple vitamins-minerals) .... Take 1 tablet by mouth once a day 6)  Calcium 600 Tabs (Calcium carbonate tabs) .... Take 1 tablet by mouth two times a day 7)  Glucometer  .... To check glucose once daily and as needed for dm 250.0 8)  Accu Check Aviva Strips and Softclix Lancets  .... Use to check blood sugar  once daily and as needed for dm 9)  Tramadol Hcl 50 Mg Tabs (Tramadol hcl) .... Take 1 tablet by mouth every 4- 6 hours as needed 10)  Colcrys 0.6 Mg Tabs (Colchicine) .Marland Kitchen.. 1 by mouth two times a day as needed gout- take with food 11)  Zoloft 50 Mg Tabs (Sertraline hcl) .... 1/2 once daily 12)  Mag Oxide Over The Counter 400 Mg  .Marland KitchenMarland Kitchen. 1 by mouth once daily 13)  Allopurinol 100 Mg Tabs (Allopurinol) .Marland Kitchen.. 1 by mouth two times a day to prevent gout  Patient Instructions: 1)  no  restrictions for surgery 2)  2nd blood pressure reading is excellent 3)  no change in medicines  4)  good luck with everything  5)  keep up the good work with weight loss    Orders Added: 1)  Est. Patient Level III [40981]    Current Allergies (reviewed today): ! BUSPAR ! * VIT C LISINOPRIL (LISINOPRIL)

## 2010-04-08 NOTE — Letter (Signed)
Summary: Pre-Operative Clearance  Pre-Operative Clearance   Imported By: Kassie Mends 03/29/2010 10:48:28  _____________________________________________________________________  External Attachment:    Type:   Image     Comment:   External Document

## 2010-04-23 ENCOUNTER — Other Ambulatory Visit: Payer: Self-pay | Admitting: Orthopedic Surgery

## 2010-04-23 ENCOUNTER — Encounter (HOSPITAL_COMMUNITY): Payer: Medicare Other

## 2010-04-23 LAB — URINE MICROSCOPIC-ADD ON

## 2010-04-23 LAB — CBC
HCT: 40.5 % (ref 36.0–46.0)
Hemoglobin: 12.9 g/dL (ref 12.0–15.0)
MCH: 27.3 pg (ref 26.0–34.0)
MCHC: 31.9 g/dL (ref 30.0–36.0)
MCV: 85.6 fL (ref 78.0–100.0)
Platelets: 233 10*3/uL (ref 150–400)
RBC: 4.73 MIL/uL (ref 3.87–5.11)
RDW: 15.7 % — ABNORMAL HIGH (ref 11.5–15.5)
WBC: 5.6 10*3/uL (ref 4.0–10.5)

## 2010-04-23 LAB — COMPREHENSIVE METABOLIC PANEL
ALT: 24 U/L (ref 0–35)
AST: 25 U/L (ref 0–37)
Albumin: 4 g/dL (ref 3.5–5.2)
Alkaline Phosphatase: 93 U/L (ref 39–117)
BUN: 14 mg/dL (ref 6–23)
CO2: 29 mEq/L (ref 19–32)
Calcium: 10.1 mg/dL (ref 8.4–10.5)
Chloride: 104 mEq/L (ref 96–112)
Creatinine, Ser: 0.86 mg/dL (ref 0.4–1.2)
GFR calc Af Amer: >60 mL/min (ref >60)
GFR calc non Af Amer: >60 mL/min (ref >60)
Glucose, Bld: 87 mg/dL (ref 70–99)
Potassium: 3.7 mEq/L (ref 3.5–5.1)
Sodium: 143 mEq/L (ref 135–145)
Total Bilirubin: 0.6 mg/dL (ref 0.3–1.2)
Total Protein: 7.2 g/dL (ref 6.0–8.3)

## 2010-04-23 LAB — URINALYSIS, ROUTINE W REFLEX MICROSCOPIC
Bilirubin Urine: NEGATIVE
Glucose, UA: NEGATIVE mg/dL
Hgb urine dipstick: NEGATIVE
Nitrite: NEGATIVE
Protein, ur: NEGATIVE mg/dL
Specific Gravity, Urine: 1.027 (ref 1.005–1.030)
Urobilinogen, UA: 0.2 mg/dL (ref 0.0–1.0)
pH: 6.5 (ref 5.0–8.0)

## 2010-04-23 LAB — SURGICAL PCR SCREEN
MRSA, PCR: NEGATIVE
Staphylococcus aureus: NEGATIVE

## 2010-04-23 LAB — PROTIME-INR
INR: 1.02 (ref 0.00–1.49)
Prothrombin Time: 13.6 seconds (ref 11.6–15.2)

## 2010-04-23 LAB — DIFFERENTIAL
Basophils Absolute: 0 10*3/uL (ref 0.0–0.1)
Basophils Relative: 1 % (ref 0–1)
Eosinophils Absolute: 0.1 10*3/uL (ref 0.0–0.7)
Eosinophils Relative: 3 % (ref 0–5)
Lymphocytes Relative: 42 % (ref 12–46)
Lymphs Abs: 2.3 10*3/uL (ref 0.7–4.0)
Monocytes Absolute: 0.5 10*3/uL (ref 0.1–1.0)
Monocytes Relative: 8 % (ref 3–12)
Neutro Abs: 2.6 10*3/uL (ref 1.7–7.7)
Neutrophils Relative %: 47 % (ref 43–77)

## 2010-04-23 LAB — APTT: aPTT: 31 seconds (ref 24–37)

## 2010-04-29 ENCOUNTER — Inpatient Hospital Stay (HOSPITAL_COMMUNITY)
Admission: RE | Admit: 2010-04-29 | Discharge: 2010-05-03 | DRG: 470 | Disposition: A | Discharge status: Skilled Nursing Facility | Payer: Medicare Other | Source: Ambulatory Visit | Attending: Orthopedic Surgery | Admitting: Orthopedic Surgery

## 2010-04-29 DIAGNOSIS — F3289 Other specified depressive episodes: Secondary | ICD-10-CM | POA: Diagnosis present

## 2010-04-29 DIAGNOSIS — G4733 Obstructive sleep apnea (adult) (pediatric): Secondary | ICD-10-CM | POA: Diagnosis present

## 2010-04-29 DIAGNOSIS — M109 Gout, unspecified: Secondary | ICD-10-CM | POA: Diagnosis present

## 2010-04-29 DIAGNOSIS — E669 Obesity, unspecified: Secondary | ICD-10-CM | POA: Diagnosis present

## 2010-04-29 DIAGNOSIS — I10 Essential (primary) hypertension: Secondary | ICD-10-CM | POA: Diagnosis present

## 2010-04-29 DIAGNOSIS — Z96659 Presence of unspecified artificial knee joint: Secondary | ICD-10-CM

## 2010-04-29 DIAGNOSIS — Z23 Encounter for immunization: Secondary | ICD-10-CM

## 2010-04-29 DIAGNOSIS — M1710 Unilateral primary osteoarthritis, unspecified knee: Principal | ICD-10-CM | POA: Diagnosis present

## 2010-04-29 DIAGNOSIS — D62 Acute posthemorrhagic anemia: Secondary | ICD-10-CM | POA: Diagnosis not present

## 2010-04-29 DIAGNOSIS — Z01812 Encounter for preprocedural laboratory examination: Secondary | ICD-10-CM

## 2010-04-29 DIAGNOSIS — R7309 Other abnormal glucose: Secondary | ICD-10-CM | POA: Diagnosis present

## 2010-04-29 DIAGNOSIS — F329 Major depressive disorder, single episode, unspecified: Secondary | ICD-10-CM | POA: Diagnosis present

## 2010-04-29 DIAGNOSIS — K5900 Constipation, unspecified: Secondary | ICD-10-CM | POA: Diagnosis not present

## 2010-04-29 LAB — GLUCOSE, CAPILLARY: Glucose-Capillary: 96 mg/dL (ref 70–99)

## 2010-04-30 LAB — CROSSMATCH
ABO/RH(D): O POS
Antibody Screen: NEGATIVE

## 2010-04-30 LAB — BASIC METABOLIC PANEL
BUN: 21 mg/dL (ref 6–23)
CO2: 29 mEq/L (ref 19–32)
Calcium: 9.2 mg/dL (ref 8.4–10.5)
Chloride: 108 mEq/L (ref 96–112)
Creatinine, Ser: 1.63 mg/dL — ABNORMAL HIGH (ref 0.4–1.2)
GFR calc Af Amer: 38 mL/min — ABNORMAL LOW (ref >60)
GFR calc non Af Amer: 31 mL/min — ABNORMAL LOW (ref >60)
Glucose, Bld: 121 mg/dL — ABNORMAL HIGH (ref 70–99)
Potassium: 5.3 mEq/L — ABNORMAL HIGH (ref 3.5–5.1)
Sodium: 142 mEq/L (ref 135–145)

## 2010-04-30 LAB — CBC
HCT: 32.8 % — ABNORMAL LOW (ref 36.0–46.0)
Hemoglobin: 10.1 g/dL — ABNORMAL LOW (ref 12.0–15.0)
MCH: 27.4 pg (ref 26.0–34.0)
MCHC: 30.8 g/dL (ref 30.0–36.0)
MCV: 88.9 fL (ref 78.0–100.0)
Platelets: 200 10*3/uL (ref 150–400)
RBC: 3.69 MIL/uL — ABNORMAL LOW (ref 3.87–5.11)
RDW: 16.5 % — ABNORMAL HIGH (ref 11.5–15.5)
WBC: 7.3 10*3/uL (ref 4.0–10.5)

## 2010-04-30 LAB — POTASSIUM: Potassium: 5.1 mEq/L (ref 3.5–5.1)

## 2010-04-30 NOTE — Op Note (Signed)
NAMEMARISHKA, [name]            ACCOUNT NO.:  [id number]  MEDICAL RECORD NO.:  [id number]           PATIENT TYPE:  I  LOCATION:  1618                         FACILITY:  [facility]  PHYSICIAN:  Monica [name], M.D.  DATE OF BIRTH:  1935-03-25  DATE OF PROCEDURE:  04/29/2010 DATE OF DISCHARGE:                              OPERATIVE REPORT   INDICATION AND JUSTIFICATION FOR KNEE:  The patient is a 75 year old with a right total knee.  She has fixed varus deformity of the left knee with complete bone against bone and pain with weightbearing.  Resistant to all conservative measures.  PREOPERATIVE DIAGNOSIS:  End-stage osteoarthritis, left knee.  SURGICAL PROCEDURE:  Left total knee arthroplasty with computer navigation assistance.  POSTOPERATIVE DIAGNOSIS:  End-stage osteoarthritis, left knee.  SURGEON:  Monica [name], M.D.  ASSISTANT:  Monica [name], P.A.  ANESTHESIA:  General with femoral nerve block.  FINDINGS:  5 degrees fixed varus 8 degrees fixed flexion, complete bone against bone medially, bad wear-and-tear in lateral compartment with spurs pretty much every where.  DESCRIPTION OF PROCEDURE:  Under general anesthesia with femoral nerve block, the left leg was prepared with DuraPrep and draped as a sterile field.  Standard midline incision was made after wrapping out the leg and applying a sterile tourniquet.  Patella was everted.  The femur was sized at a standard plus.  Debridement was done in preparation for computer mapping.  Schanz pins were placed in the superior medial tibia through extra stab wounds and then the distal femur.  The arrays were set up.  The proximal tibia and distal femur are then mapped.  Following this, the computer is used for proximal tibial resection, taking 10 mm off the high side and approximately 4 mm off the low side.  The balancer was then inserted and care was taken to do a release of the medial side to correct the 5 degrees of  varus.  Once this was completed, the flexion gap was then measured.  Using the computer, the anterior and posterior flare of the distal femur were resected.  The computer confirmed the femoral size, was a standard plus, it actually fit very well anterior to posteriorly, was just minimally narrow from medial to lateral.  The anterior and posterior cuts on the distal femur are then made.  The distal femoral cut was then made.  After this debridement, the lamina spreader was inserted.  Remnants of the menisci were resected.  The spurs were taken off the back of the femoral condyles.  The recessing guide was then used.  At this point, the Mchale was inserted.  The tibia was sized to a #3, center peg hole with keel was used and trial seating of a #3 tibia, 10 bearing and standard plus femur were inserted.  The knee __________ anatomic alignment __________ varus and valgus and full extension.  The patella was then osteotomized and 3 peg holes were inserted.  Trial patella fit well as well.  At this point, all components are well seated.  Permanent components were obtained.  Bony surfaces were prepared with pulse irrigation.  All components were cemented in  place.  Once the cement was hardened, excess cement was taken off.  The tourniquet was let down at an hour and 5 minutes.  Multiple small vessels were cauterized and the knee was closed in layers with #1 Tycron, #1 Vicryl, 2-0 Vicryl and skin clips.  The patient returned to Recovery in good condition.     Monica [name], M.D.     Monica [name]  D:  04/29/2010  T:  04/30/2010  Job:  [id number]  Electronically Signed by Monica [name] M.D. on 04/30/2010 01:17:15 PM

## 2010-05-01 LAB — BASIC METABOLIC PANEL
BUN: 23 mg/dL (ref 6–23)
CO2: 29 mEq/L (ref 19–32)
Calcium: 9 mg/dL (ref 8.4–10.5)
Chloride: 106 mEq/L (ref 96–112)
Creatinine, Ser: 1.1 mg/dL (ref 0.4–1.2)
GFR calc Af Amer: 59 mL/min — ABNORMAL LOW (ref >60)
GFR calc non Af Amer: 49 mL/min — ABNORMAL LOW (ref >60)
Glucose, Bld: 118 mg/dL — ABNORMAL HIGH (ref 70–99)
Potassium: 4.8 mEq/L (ref 3.5–5.1)
Sodium: 139 mEq/L (ref 135–145)

## 2010-05-01 LAB — CBC
HCT: 29.6 % — ABNORMAL LOW (ref 36.0–46.0)
Hemoglobin: 9.1 g/dL — ABNORMAL LOW (ref 12.0–15.0)
MCH: 26.8 pg (ref 26.0–34.0)
MCHC: 30.7 g/dL (ref 30.0–36.0)
MCV: 87.3 fL (ref 78.0–100.0)
Platelets: 174 10*3/uL (ref 150–400)
RBC: 3.39 MIL/uL — ABNORMAL LOW (ref 3.87–5.11)
RDW: 16.4 % — ABNORMAL HIGH (ref 11.5–15.5)
WBC: 7.4 10*3/uL (ref 4.0–10.5)

## 2010-05-02 LAB — BASIC METABOLIC PANEL
BUN: 19 mg/dL (ref 6–23)
CO2: 28 mEq/L (ref 19–32)
Calcium: 8.9 mg/dL (ref 8.4–10.5)
Chloride: 109 mEq/L (ref 96–112)
Creatinine, Ser: 0.99 mg/dL (ref 0.4–1.2)
GFR calc Af Amer: >60 mL/min (ref >60)
GFR calc non Af Amer: 55 mL/min — ABNORMAL LOW (ref >60)
Glucose, Bld: 105 mg/dL — ABNORMAL HIGH (ref 70–99)
Potassium: 4.2 mEq/L (ref 3.5–5.1)
Sodium: 140 mEq/L (ref 135–145)

## 2010-05-02 LAB — CBC
HCT: 27 % — ABNORMAL LOW (ref 36.0–46.0)
Hemoglobin: 8.5 g/dL — ABNORMAL LOW (ref 12.0–15.0)
MCH: 27.5 pg (ref 26.0–34.0)
MCHC: 31.5 g/dL (ref 30.0–36.0)
MCV: 87.4 fL (ref 78.0–100.0)
Platelets: 174 10*3/uL (ref 150–400)
RBC: 3.09 MIL/uL — ABNORMAL LOW (ref 3.87–5.11)
RDW: 16.7 % — ABNORMAL HIGH (ref 11.5–15.5)
WBC: 6.7 10*3/uL (ref 4.0–10.5)

## 2010-05-06 LAB — CROSSMATCH
ABO/RH(D): O POS
Antibody Screen: NEGATIVE

## 2010-05-06 LAB — CBC
HCT: 29 % — ABNORMAL LOW (ref 36.0–46.0)
HCT: 29.9 % — ABNORMAL LOW (ref 36.0–46.0)
HCT: 32.4 % — ABNORMAL LOW (ref 36.0–46.0)
Hemoglobin: 10 g/dL — ABNORMAL LOW (ref 12.0–15.0)
Hemoglobin: 10.7 g/dL — ABNORMAL LOW (ref 12.0–15.0)
Hemoglobin: 9.7 g/dL — ABNORMAL LOW (ref 12.0–15.0)
MCH: 28.3 pg (ref 26.0–34.0)
MCH: 28.8 pg (ref 26.0–34.0)
MCH: 28.8 pg (ref 26.0–34.0)
MCHC: 33.1 g/dL (ref 30.0–36.0)
MCHC: 33.4 g/dL (ref 30.0–36.0)
MCHC: 33.4 g/dL (ref 30.0–36.0)
MCV: 85.6 fL (ref 78.0–100.0)
MCV: 86.1 fL (ref 78.0–100.0)
MCV: 86.2 fL (ref 78.0–100.0)
Platelets: 158 10*3/uL (ref 150–400)
Platelets: 166 10*3/uL (ref 150–400)
Platelets: 190 10*3/uL (ref 150–400)
RBC: 3.37 MIL/uL — ABNORMAL LOW (ref 3.87–5.11)
RBC: 3.46 MIL/uL — ABNORMAL LOW (ref 3.87–5.11)
RBC: 3.78 MIL/uL — ABNORMAL LOW (ref 3.87–5.11)
RDW: 14.7 % (ref 11.5–15.5)
RDW: 15.1 % (ref 11.5–15.5)
RDW: 15.4 % (ref 11.5–15.5)
WBC: 6.6 10*3/uL (ref 4.0–10.5)
WBC: 6.8 10*3/uL (ref 4.0–10.5)
WBC: 7.9 10*3/uL (ref 4.0–10.5)

## 2010-05-06 LAB — BASIC METABOLIC PANEL
BUN: 13 mg/dL (ref 6–23)
BUN: 13 mg/dL (ref 6–23)
CO2: 30 mEq/L (ref 19–32)
CO2: 32 mEq/L (ref 19–32)
Calcium: 8.7 mg/dL (ref 8.4–10.5)
Calcium: 8.9 mg/dL (ref 8.4–10.5)
Chloride: 103 mEq/L (ref 96–112)
Chloride: 106 mEq/L (ref 96–112)
Creatinine, Ser: 0.76 mg/dL (ref 0.4–1.2)
Creatinine, Ser: 0.88 mg/dL (ref 0.4–1.2)
GFR calc Af Amer: >60 mL/min (ref >60)
GFR calc Af Amer: >60 mL/min (ref >60)
GFR calc non Af Amer: >60 mL/min (ref >60)
GFR calc non Af Amer: >60 mL/min (ref >60)
Glucose, Bld: 110 mg/dL — ABNORMAL HIGH (ref 70–99)
Glucose, Bld: 116 mg/dL — ABNORMAL HIGH (ref 70–99)
Potassium: 3.3 mEq/L — ABNORMAL LOW (ref 3.5–5.1)
Potassium: 3.4 mEq/L — ABNORMAL LOW (ref 3.5–5.1)
Sodium: 142 mEq/L (ref 135–145)
Sodium: 142 mEq/L (ref 135–145)

## 2010-05-06 LAB — POTASSIUM
Potassium: 3.3 mEq/L — ABNORMAL LOW (ref 3.5–5.1)
Potassium: 3.5 mEq/L (ref 3.5–5.1)

## 2010-05-06 LAB — ABO/RH: ABO/RH(D): O POS

## 2010-05-07 LAB — COMPREHENSIVE METABOLIC PANEL
ALT: 29 U/L (ref 0–35)
AST: 28 U/L (ref 0–37)
Albumin: 4 g/dL (ref 3.5–5.2)
Alkaline Phosphatase: 79 U/L (ref 39–117)
BUN: 21 mg/dL (ref 6–23)
CO2: 30 mEq/L (ref 19–32)
Calcium: 10.1 mg/dL (ref 8.4–10.5)
Chloride: 104 mEq/L (ref 96–112)
Creatinine, Ser: 0.89 mg/dL (ref 0.4–1.2)
GFR calc Af Amer: >60 mL/min (ref >60)
GFR calc non Af Amer: >60 mL/min (ref >60)
Glucose, Bld: 104 mg/dL — ABNORMAL HIGH (ref 70–99)
Potassium: 3.1 mEq/L — ABNORMAL LOW (ref 3.5–5.1)
Sodium: 141 mEq/L (ref 135–145)
Total Bilirubin: 0.8 mg/dL (ref 0.3–1.2)
Total Protein: 7.6 g/dL (ref 6.0–8.3)

## 2010-05-07 LAB — CBC
HCT: 39.5 % (ref 36.0–46.0)
Hemoglobin: 13.2 g/dL (ref 12.0–15.0)
MCH: 28.7 pg (ref 26.0–34.0)
MCHC: 33.4 g/dL (ref 30.0–36.0)
MCV: 85.9 fL (ref 78.0–100.0)
Platelets: 279 10*3/uL (ref 150–400)
RBC: 4.6 MIL/uL (ref 3.87–5.11)
RDW: 15 % (ref 11.5–15.5)
WBC: 6 10*3/uL (ref 4.0–10.5)

## 2010-05-07 LAB — SURGICAL PCR SCREEN
MRSA, PCR: NEGATIVE
Staphylococcus aureus: NEGATIVE

## 2010-05-07 LAB — DIFFERENTIAL
Basophils Absolute: 0 10*3/uL (ref 0.0–0.1)
Basophils Relative: 1 % (ref 0–1)
Eosinophils Absolute: 0.2 10*3/uL (ref 0.0–0.7)
Eosinophils Relative: 3 % (ref 0–5)
Lymphocytes Relative: 43 % (ref 12–46)
Lymphs Abs: 2.6 10*3/uL (ref 0.7–4.0)
Monocytes Absolute: 0.6 10*3/uL (ref 0.1–1.0)
Monocytes Relative: 10 % (ref 3–12)
Neutro Abs: 2.6 10*3/uL (ref 1.7–7.7)
Neutrophils Relative %: 44 % (ref 43–77)

## 2010-05-07 LAB — URINALYSIS, ROUTINE W REFLEX MICROSCOPIC
Bilirubin Urine: NEGATIVE
Glucose, UA: NEGATIVE mg/dL
Hgb urine dipstick: NEGATIVE
Ketones, ur: NEGATIVE mg/dL
Nitrite: NEGATIVE
Protein, ur: NEGATIVE mg/dL
Specific Gravity, Urine: 1.01 (ref 1.005–1.030)
Urobilinogen, UA: 0.2 mg/dL (ref 0.0–1.0)
pH: 6 (ref 5.0–8.0)

## 2010-05-07 LAB — PROTIME-INR
INR: 0.94 (ref 0.00–1.49)
Prothrombin Time: 12.8 seconds (ref 11.6–15.2)

## 2010-05-07 LAB — APTT: aPTT: 30 seconds (ref 24–37)

## 2010-05-25 NOTE — Discharge Summary (Signed)
[name], Monica            ACCOUNT NO.:  [id number]  MEDICAL RECORD NO.:  [id number]           PATIENT TYPE:  I  LOCATION:  1618                         FACILITY:  [facility]  PHYSICIAN:  Shaquia Berkley L. Rendall, M.D.  DATE OF BIRTH:  1936-02-14  DATE OF ADMISSION:  04/29/2010 DATE OF DISCHARGE:  05/03/2010                              DISCHARGE SUMMARY   ADMISSION DIAGNOSES: 1. End-stage osteoarthritis, left knee, with history of right total     knee replacement. 2. Obesity. 3. Hypertension. 4. Prediabetic. 5. Gout. 6. Sleep apnea. 7. Depression.  DISCHARGE DIAGNOSES: 1. End-stage osteoarthritis, left knee, status post left total knee     arthroplasty. 2. History of right total knee arthroplasty. 3. Acute blood loss anemia secondary to surgery. 4. Constipation, now resolved. 5. Obesity. 6. Hypertension. 7. Prediabetic. 8. Gout. 9. Sleep apnea. 10.Depression.  SURGICAL PROCEDURES:  On April 29, 2010, Monica [name] underwent a left total knee arthroplasty by Dr. Jonny Ruiz L.  Rendall assisted by Arnoldo Morale PA-C.  It was done with computer navigation.  A DePuy LCS complete metal back patella cemented size standard plus was placed with primary femoral component cemented size standard plus left.  An MBT tibial tray rotating platform size 3 keeled cemented was placed with an LCS complete tibial insert RP 10-mm thickness standard plus.  COMPLICATIONS:  None.  CONSULTS: 1. Physical Therapy consult, April 30, 2010, in addition to a case     management and social work consult at that time. 2. Occupational Therapy consult, May 01, 2010.  HISTORY OF PRESENT ILLNESS:  This 75 year old black female patient presented to Dr. Priscille Kluver with history of a right total knee in September of last year.  She has had a 2- to 4-year history of gradual onset progressive left knee pain without injury or surgery.  Left knee pain is constant with weightbearing and annoying ache over the medial joint  line without radiation.  It increases with prolonged sitting and bad weather and decreases with rest.  The knee catches and locks.  She has failed conservative treatment and because of that she is presenting for left knee replacement.  HOSPITAL COURSE:  Monica [name] tolerated her surgical procedure well without immediate postoperative complications.  On postop day #1, she was afebrile, vitals were stable.  Hemoglobin 10.1, hematocrit 32.8. She was started on CPM per protocol, switched to p.o. pain meds.  Postop day #2, T max was 98.3.  Vitals were stable.  Hemoglobin 9.1, hematocrit 29.6.  She was tolerating CPM 0 to 40 degrees.  She had some constipation that was treated with suppository.  Postop day #3, again afebrile, vitals stable.  Hemoglobin was 8.5, hematocrit 27.  Dressing was changed.  Mild amount of serosanguineous drainage.  She was doing well with therapy and was felt to be ready for transfer to skilled facility the next day.  On May 03, 2010, she again remained afebrile.  She had little bit ofdrainage from the incision but otherwise tolerating CPM 0 to 60 degrees and doing well with therapy.  It was felt she was ready for transfer to skilled facility today.  DISCHARGE INSTRUCTIONS:  Diet:  She can resume her regular diet.  No concentrated sweets since she is kind of prediabetic.  MEDICATIONS:  Please see [facility] Discharge Med Rec sheet for complete documentation of her medications.  I do recommend an iron supplement twice a day for 1 month and in addition we have placed her on Celebrex, Robaxin, Percocet and Xarelto.  She is to hold her tramadol while on Percocet for pain.  ACTIVITY:  She can be out of bed weightbearing as tolerated on the left leg with use of a walker.  She is to have CPM for 2 weeks after surgery 0 to 90 degrees 6 to 8 hours a day and home health when she is discharged from [facility] per [place].  Please see the white total joint discharge sheet  for further activity instructions.  FOLLOWUP:  She is to follow up with Dr. Priscille Kluver in our office on Thursday May 13, 2010, and needs to call [phone] for that appointment.  LABORATORY DATA:  Hemoglobin and hematocrit have ranged from 10.1 and 32.8 on the 9th to 8.5 and 27 on the 11th.  Platelets and white count have been within normal limits.  On April 30, 2010, her potassium was 5.3 and on recheck it was 5.1.  BUN 21, creatinine 1.63.  Then on May 02, 2010, everything had returned to normal with a BUN of 19, creatinine of 0.99.  Her glucose has ranged from 105 to 121.  All other laboratory studies were within normal limits.    Legrand Pitts Duffy, P.A.   ______________________________ Carlisle Beers. Priscille Kluver, M.D.   KED/MEDQ  D:  05/03/2010  T:  05/03/2010  Job:  [id number]  Electronically Signed by Arnoldo Morale P.A. on 05/06/2010 02:19:27 PM Electronically Signed by Erasmo Leventhal M.D. on 05/25/2010 01:42:48 PM

## 2010-07-09 NOTE — Assessment & Plan Note (Signed)
[facility] OFFICE NOTE   NAME:[name], Monica                     MRN:          [id number]  DATE:03/22/2006                            DOB:          Feb 27, 1935    PRIMARY CARE PHYSICIAN:  Dr. Laurita Quint.   REASON FOR VISIT:  Follow up cardiac testing.   HISTORY OF PRESENT ILLNESS:  Ms. Sigman returns to review recent  cardiac testing.  She states that she has had no further symptoms of  arm, chest, or shoulder discomfort since our last visit.  I referred her  for additional cardiac risk stratification and she underwent an  echocardiogram which revealed a normal left ventricular ejection  fraction of 65% without regional wall motion abnormalities, mild mitral  annular calcification, and upper normal left atrial chamber size.  A  Myoview was noted to be low risk without obvious ischemia, either by  electrocardiographic or perfusion data.  She did have evidence of soft  tissue attenuation in the inferior wall.  There was question of possible  decreased inferior thickening, although this was not noted on the  ultrasound.  I reviewed these with her today and reassured her.  I have  not received the prior cardiac catheterization results from Dr. Sharyn Lull  several years ago as yet.   ALLERGIES:  NO KNOWN DRUG ALLERGIES.   PRESENT MEDICATIONS:  1. Toprol XL 100 mg p.o. daily.  2. Klor-Con 20 mEq p.o. b.i.d.  3. Calcium 600 mg two tablets p.o. daily.  4. Aspirin 81 mg p.o. daily  5. Diltiazem CD 360 mg p.o. daily.  6. Lasix 40 mg p.o. daily.  7. Multivitamin 1 p.o. daily.  8. Fexofenadine 180 mg p.o. daily.  9. Magnesium supplements.   REVIEW OF SYSTEMS:  As described in history of present illness,  otherwise negative.   EXAMINATION:  Blood pressure is 142/82, heart rate is 78, weighs 254  pounds.  The patient is comfortable and in no acute distress.  NECK:  No elevated jugular venous pressure or loud  bruits.  LUNGS:  Clear without labored breathing.  CARDIAC:  Reveals a regular rate and rhythm without loud murmur or  gallop.  EXTREMITIES:  Show no pitting edema.   IMPRESSION/RECOMMENDATION:  1. History of dyspnea on exertion, as well as some recent left arm,      shoulder, and chest discomfort, now resolved.  Cardiac risk      stratification was overall low risk and at this point would suggest      a strategy of observation and medical therapy for risk      modification.  Certainly if she has any progressive symptoms we may      investigate things further,      otherwise she will continue to follow with Dr. Hetty Ely.  2. Cardiology followup as needed.     Jonelle Sidle, MD  Electronically Signed    SGM/MedQ  DD: 03/22/2006  DT: 03/22/2006  Job #: [id number]   cc:   Arta Silence, MD

## 2010-07-09 NOTE — Op Note (Signed)
[name], Monica [name] NO.:  [id number]   MEDICAL RECORD NO.:  [id number]          PATIENT TYPE:  AMB   LOCATION:  ENDO                         FACILITY:  [facility]   PHYSICIAN:  Anselmo Rod, M.D.  DATE OF BIRTH:  Mar 31, 1935   DATE OF PROCEDURE:  10/22/2004  DATE OF DISCHARGE:                                 OPERATIVE REPORT   PROCEDURE PERFORMED:  Colonoscopy.   ENDOSCOPIST:  Anselmo Rod, M.D.   INSTRUMENT USED:  Olympus video colonoscope.   INDICATION FOR PROCEDURE:  A 75 year old African-American female with a  history of rectal bleeding and anemia.  Rule out colonic polyps, masses,  etc.  The patient has a history of diverticulosis diagnosed at colonoscopy  in the past.   PREPROCEDURE PREPARATION:  Informed consent was procured from the patient.  The patient was fasted for eight hours prior to the procedure and prepped  with Osmoprep the night prior to the procedure and the morning of the  procedure.  The risks and benefits of the procedure, including a 10% miss  rate of cancer and polyps, were discussed with the patient as well.   PREPROCEDURE PHYSICAL:  VITAL SIGNS:  The patient had stable vital signs.  NECK:  Supple.  CHEST:  Clear to auscultation.  S1, S2 regular.  ABDOMEN:  Obese, nontender, with normal bowel sounds.   DESCRIPTION OF PROCEDURE:  The patient was placed in the left lateral  decubitus position and sedated with 50 mg of Demerol and 7.5 mg of Versed in  slow incremental doses.  Once the patient was adequately sedate and  maintained on low-flow oxygen and continuous cardiac monitoring, the Olympus  video colonoscope was advanced from the rectum to the cecum.  The  appendiceal orifice and the ileocecal valve were clearly visualized and  photographed.  There were large diverticula in the sigmoid colon with  inspissated stool in several of the diverticula.  There were a few  diverticula in the right colon as well.  Multiple washes were  done.  The  appendiceal orifice and ileocecal valve were clearly visualized and  photographed.  No masses, polyps, erosions or ulcerations were seen.  There  was some residual stool in the colon.  Very small lesions could be missed.  Retroflexion in the rectum revealed small internal hemorrhoids.  The patient  tolerated the procedure well without complications.   IMPRESSION:  1.  Large diverticula in the sigmoid colon with a few diverticula in the      right colon, stool in several of the diverticula.  2.  Small internal hemorrhoids.  3.  No masses or polyps seen.  4.  Significant amount of residual stool in the colon, multiple washes done.      Small lesions could be missed.   RECOMMENDATIONS:  1.  Continue a high-fiber diet with liberal fluid intake.  Avoid nuts, seeds      and popcorn.  2.  Take multivitamin with iron for now.  3.  Repeat colonoscopy in the next 10 years.  4.  Outpatient follow-up in the next two weeks or earlier if need  be.  5.  Avoid all nonsteroidals for now.      Anselmo Rod, M.D.  Electronically Signed     JNM/MEDQ  D:  10/22/2004  T:  10/22/2004  Job:  [id number]   cc:   Marne A. Milinda Antis, M.D. [facility]

## 2010-07-09 NOTE — Letter (Signed)
February 09, 2006    Jonelle Sidle, MD  [phone] [address]  [place], [place] [address]   RE:  Monica [name], Monica [name]  MRN:  [id number]  /  DOB:  [birth year]   Dear Dr. Diona Browner:   This is to introduce Kalyse Meharg, a 75 year old black female new to  me as of today, a patient of Dr. Royden Purl who I am seeing as an acute  work-in for chest discomfort which I think is due to milk ingestion and  reflux disease with gas.  However, she has atypical symptoms of left arm  and chest discomfort with this.  Her EKG was normal.  Her discomfort was  not exertional and she looks comfortable on exam.  She denies nausea or  diaphoresis.  However, she has significant risk factors to include  obesity, hypertension, hyperlipidemia, and I think she probably ought to  be screened.  As I said, her EKG was normal today with sinus rhythm,  minimal anterior T-wave changes, no Q's or ST elevations or depressions  that I see.  She is comfortable when seen.  Her blood pressure was  significantly elevated at 180/90 when seen.  I increased her diltiazem  today to 360 mg and continued her Toprol-XL at 100 mg a day, although I  had her move it to nighttime.  She  also is on Lasix 40 mg a day and  takes potassium 20 mEq twice a day.  She is on no other medications  formally except for an enteric-coated 81-mg aspirin.   I appreciate your seeing her.  I look forward to your evaluation.    Sincerely,     Arta Silence, MD  Electronically Signed   RNS/MedQ  DD: 02/09/2006  DT: 02/10/2006  Job #: [id number]

## 2010-07-09 NOTE — Procedures (Signed)
[place]  Patient:    Monica [name], Monica [name]                   MRN: [id number] Proc. Date: 03/16/00 Adm. Date:  [id number] Disc. Date: [id number] Attending:  Charna Elizabeth CC:         Tera Mater. Clent Ridges, M.D.   Procedure Report  DATE OF BIRTH:  [birth year].  PROCEDURE:  Colonoscopy.  ENDOSCOPIST:  Anselmo Rod, M.D.  INSTRUMENT USED:  Olympus video colonoscope.  INDICATION FOR PROCEDURE:  A 75 year old African-American female with a history of right lower quadrant pain, change in bowel habits, and guaiac-positive stools.  Rule out colonic polyps, masses, hemorrhoids, etc.  PREPROCEDURE PREPARATION:  Informed consent was procured from the patient. The patient was fasted for eight hours prior to the procedure and prepped with a bottle of magnesium citrate and a gallon of NuLytely the night prior to the procedure.  PREPROCEDURE PHYSICAL:  VITAL SIGNS:  The patient had stable vital signs.  NECK:  Supple.  CHEST:  Clear to auscultation.  S1, S2 regular.  ABDOMEN:  Obese with right lower quadrant tenderness on palpation with minimal guarding, no rebound or rigidity, no hepatosplenomegaly.  DESCRIPTION OF PROCEDURE:  The patient was placed in the left lateral decubitus position and sedated with 60 mg of Demerol and 6 mg of Versed intravenously.  Once the patient was adequately sedate and maintained on low-flow oxygen and continuous cardiac monitoring, the Olympus video colonoscope was advanced from the rectum to the cecum with slight difficulty secondary to a very tortuous colon.  There was pan-diverticular disease with _____ seen in the left colon and a few scattered diverticula in the right colon.  No masses or polyps were seen.  The patient had small external and internal hemorrhoids seen on anal inspection and retroflexion.  The patient tolerated the procedure well without complications.  IMPRESSION: 1. Pan-diverticulosis with _____ of  the left colon. 2. Small, nonbleeding internal and external hemorrhoids.  RECOMMENDATIONS: 1. Repeat guaiac testing will be done on an outpatient basis and further    recommendations made as needed. 2. A high-fiber diet with liberal fluid intake has been advocated. 3. Outpatient follow-up is advised in the next two weeks. DD:  03/16/00 TD:  03/16/00 Job: [id number]

## 2010-07-09 NOTE — Assessment & Plan Note (Signed)
[facility] OFFICE NOTE   NAME:RICHMONDChristian, Treadway                     MRN:          [id number]  DATE:02/27/2006                            DOB:          13-Nov-1935    REFERRING PHYSICIAN:  Arta Silence, MD   CARDIOLOGY CONSULTATION.   REASON FOR CONSULTATION:  Recent chest and left arm discomfort.   HISTORY OF PRESENT ILLNESS:  Ms. Estelle is a pleasant 75 year old  woman with a reported 20 year history of hypertension as well as obesity  and previous tobacco use.  She denies any clear history of obstructive  coronary artery disease or myocardial infarction and states that she was  evaluated in the past by Dr. Sharyn Lull with a cardiac catheterization over  5 years ago that apparently showed no blockages.  I do not have these  details at hand.  She states that in retrospect she has been  experiencing some dyspnea on exertion over the last year which she has  attributed to increased weight and knee discomfort with ambulation.  She  has also been experiencing an intermittent discomfort in her left arm,  upper chest, and shoulder, with a particularly bad episode one day  following drinking a glass of milk in the morning without using Lactaid  (she has lactose intolerance).  She thought it may be indigestion as did  Dr. Hetty Ely, although given her cardiac risk factors is referred for  further assessment.  She does not report any clearly reproducible  symptoms with exertion.  Her electrocardiogram shows sinus rhythm with  nonspecific ST-T wave changes.   ALLERGIES:  NO KNOWN DRUG ALLERGIES.   PRESENT MEDICATIONS:  1. Diltiazem 360 mg p.o. daily.  2. Aspirin 81 mg p.o. daily.  3. Lasix 40 mg p.o. daily.  4. Multivitamin one p.o. daily.  5. Sertraline 50 mg p.o. daily.  6. Calcium 600 mg two tablets p.o. daily.  7. Klor-Con 20 mEq p.o. b.i.d.  8. Toprol-XL 100 mg p.o. daily.   PAST MEDICAL HISTORY:  Is as  outlined above.  Patient also reports prior  episode of appendicitis, hysterectomy, and previously diagnosed breast  cysts.   SOCIAL HISTORY:  Patient is a widow.  She has a prior history of tobacco  use but quit in 1999.  She drinks alcoholic beverages rarely.  She is  retired.  She has a daughter who lives in [place].   REVIEW OF SYSTEMS:  As described in the History of Present Illness.  She  has had some problems with anxiety and depression.  She has arthritic  knee pain bilaterally that limits her ambulation.  She had a recent  problem with swelling of the left side of her face and lips which she  thought may have been due to a shrimp allergy, although was ultimately  diagnosed with sinusitis.   EXAMINATION:  Blood pressure is 158/85, heart rate is 73, weight is 253  pounds.  This is an obese woman in no acute distress.  HEENT:  Conjunctiva looks normal, oropharynx is clear.  NECK:  Supple without elevated jugular venous pressure without  bruits.  No thyromegaly is noted.  LUNGS:  Generally clear without labored breathing.  CARDIAC:  Reveals a regular rate and rhythm without loud systolic murmur  or S3 gallop.  No pericardial rub is evident.  ABDOMEN:  Obese.  No obvious hepatomegaly based on limited examination.  Bowel sounds are present.  No tenderness or guarding.  EXTREMITIES:  Exhibit no significant pitting edema.  Distal pulses are  2+, skin warm and dry, no ulcerative changes.  MUSCULOSKELETAL:  No kyphosis is noted.  NEURO/PSYCHIATRIC:  The patient is alert and oriented x3, affect is  normal.   IMPRESSION AND RECOMMENDATIONS:  1. Chronic dyspnea on exertion with more recent episodes of left arm,      shoulder and chest discomfort as outlined.  Possible element of      reflux related to this in the setting of lactose intolerance,      although symptoms have not been entirely associated with eating.      She reports a prior reassuring evaluation by Dr. Sharyn Lull.  I  will      plan to obtain these records and also refer her for an adenosine      Myoview and medical therapy as well as a 2D echocardiogram for      further cardiac evaluation.  I will then have her follow up in the      office over the next weeks to discuss the results.  No medication      changes made today.  2. Further plans to follow.     Jonelle Sidle, MD  Electronically Signed    SGM/MedQ  DD: 02/27/2006  DT: 02/27/2006  Job #: [id number]   cc:   Arta Silence, MD  Audrie Gallus Milinda Antis, MD

## 2010-08-10 ENCOUNTER — Encounter: Payer: Self-pay | Admitting: Cardiovascular Disease

## 2010-11-30 ENCOUNTER — Encounter: Payer: Self-pay | Admitting: Family Medicine

## 2010-11-30 ENCOUNTER — Other Ambulatory Visit: Payer: Self-pay | Admitting: *Deleted

## 2010-11-30 MED ORDER — GLUCOSE BLOOD VI STRP: ORAL_STRIP | Status: DC

## 2010-12-01 ENCOUNTER — Ambulatory Visit (INDEPENDENT_AMBULATORY_CARE_PROVIDER_SITE_OTHER): Payer: Medicare Other | Admitting: Family Medicine

## 2010-12-01 ENCOUNTER — Encounter: Payer: Self-pay | Admitting: Family Medicine

## 2010-12-01 DIAGNOSIS — E785 Hyperlipidemia, unspecified: Secondary | ICD-10-CM

## 2010-12-01 DIAGNOSIS — I10 Essential (primary) hypertension: Secondary | ICD-10-CM

## 2010-12-01 DIAGNOSIS — R7309 Other abnormal glucose: Secondary | ICD-10-CM

## 2010-12-01 DIAGNOSIS — F411 Generalized anxiety disorder: Secondary | ICD-10-CM

## 2010-12-01 DIAGNOSIS — Z23 Encounter for immunization: Secondary | ICD-10-CM

## 2010-12-01 MED ORDER — SERTRALINE HCL 50 MG PO TABS: 25.0000 mg | ORAL_TABLET | Freq: Every day | ORAL | Status: DC

## 2010-12-01 NOTE — Patient Instructions (Addendum)
Flu shot today I sent px for zoloft to [facility] for you  Consider counseling  You can get a shingle vaccine in 1 month -- so I will get your px at our next visit  Stop the lasix and stop the potassium  Labs and follow up in 2 weeks  I really want you to exercise every day and start getting the weight off

## 2010-12-01 NOTE — Assessment & Plan Note (Signed)
This is worse with psychosocial stressors/ poor coping techniques and refusal to get counseling  Pt repeatedly complains about friends and family but does not do anything to change situations Needs exercise also Will re start zoloft Urged her to go to counseling  F/u in 2 weeks  If worse or side eff - inst pt to call

## 2010-12-01 NOTE — Assessment & Plan Note (Signed)
Not optimal diet  Again counseled on low sat fat diet  Lab before 2 wk f/u and then will disc

## 2010-12-01 NOTE — Assessment & Plan Note (Signed)
Will check a1c before f/u  Poor diet and exercise now- counseled again on that Will set her up with glucose testing supplies at 2 wk f/u a1c with lab before that appt No sympt except frequent urination

## 2010-12-01 NOTE — Assessment & Plan Note (Signed)
bp labile Pt insists on stopping lasix and K  Will do that and then have lab and f/u 2 wk Will watch bp at home Likely add agent at f/u Stressed imp of exercise and wt loss

## 2010-12-01 NOTE — Progress Notes (Signed)
Subjective:    Patient ID: Monica [name], female    DOB: 08/30/1935, 75 y.o.   MRN: [id number]  HPI Here for f/u of HTN and hyperlipidemia and hyperglycemia   150/84 first check today No cp or ha or palpitations  She wants to come off furosemide because urination is too frequent   Wt is stable with bmi of 37 (lost and gained 8 lb)  Diet - not as good as it was  Exercise finished PT , not a lot -though moves more -- still some knee pain  No regular exercise   Lipids due for a check Lab Results  Component Value Date   CHOL 200 12/09/2009   HDL 39.20 12/09/2009   LDLCALC 136* 06/02/2008   LDLDIRECT 131.8 12/09/2009   TRIG 202.0* 12/09/2009   CHOLHDL 5 12/09/2009    Hyperglycemia in past - with obesity  Gout- much better with the allopurinol Also watching diet  Needs to go back on her aspirin  Anxiety is worse lately -- irritable and crying and worse lately  Past month or so  Thinks she needs to go back on the zoloft  Stress is from her choir   Patient Active Problem List  Diagnoses  . HYPERLIPIDEMIA  . GOUT, UNSPECIFIED  . OBESITY  . ANXIETY  . DEPRESSION  . HYPERTENSION  . CARDIOMEGALY  . Allergic Rhinitis, Cause Unspecified  . SCIATICA  . SLEEP APNEA  . EDEMA  . OVEREATING  . OSTEOARTHRITIS, KNEES, BILATERAL  . HYPERGLYCEMIA   Past Medical History  Diagnosis Date  . Allergic rhinitis   . Anxiety   . Depression   . Hyperlipidemia   . HTN (hypertension)   . Obesity   . Compulsive overeating   . OA (osteoarthritis) of knee     with injections  . DM2 (diabetes mellitus, type 2)   . Gout   . Cardiomegaly   . Edema   . Other abnormal glucose   . Sciatica   . Unspecified sleep apnea    Past Surgical History  Procedure Date  . Partial hysterectomy     fibroids  . Breast lumpectomy     right x1 ('89) left x2 ('70s, '90)  . Colonoscopy 11/02    diverticulosis  . US transvaginal pelvic modified 2000, 2001  . Dexa 3/02    normal  . Cardiac  catheterization 2001    normal per pt  . Sleep study 5/05  . Colonoscopy 9/06    diverticulosis, hemorhoids  . Adenosine cardiolite 1/08    low risk   . Knee replacement 9/11    R. Dr. Priscille Kluver   History  Substance Use Topics  . Smoking status: Former Games developer  . Smokeless tobacco: Not on file   Comment: quit approx. 20 years ago  . Alcohol Use: No   Family History  Problem Relation Age of Onset  . Gout      whole family   . Stroke Father   . Hypertension Mother     (a lot of animosity in their relationship)  . Prostate cancer Brother   . Hepatitis Brother     Hep C; alcoholism (terminal)  . Breast cancer Sister   . Other Brother     drug addiction   Allergies  Allergen Reactions  . Buspirone Hcl     REACTION: made her sleepy, ? swollen legs  . Lisinopril     REACTION: dizziness   Current Outpatient Prescriptions on File Prior to Visit  Medication Sig  Dispense Refill  . allopurinol (ZYLOPRIM) 100 MG tablet Take 100 mg by mouth 2 (two) times daily.        . calcium carbonate (OS-CAL) 600 MG TABS Take 600 mg by mouth 2 (two) times daily.        Marland Kitchen glucose blood (ACCU-CHEK AVIVA) test strip Use as instructed  100 each  12  . metoprolol (TOPROL-XL) 100 MG 24 hr tablet Take 100 mg by mouth daily.        . NON FORMULARY Accu check aviva strips and softclix lancets. Use to check blood sugar once daily and PRN for DM. Also Glucometer -check glucose daily and PRN for DM       . aspirin 81 MG EC tablet Take 81 mg by mouth daily. HOLD       . colchicine 0.6 MG tablet Take 0.6 mg by mouth 2 (two) times daily. With food       . magnesium oxide (MAG-OX) 400 MG tablet Take 400 mg by mouth daily. Over the counter       . Multiple Vitamins-Minerals (BL BALANCED CARE) TABS Take 1 tablet by mouth daily.        . traMADol (ULTRAM) 50 MG tablet Take 50 mg by mouth every 6 (six) hours as needed.             Review of Systems Review of Systems  Constitutional: Negative for fever,  appetite change,and unexpected weight change. is generally fatigued and out of shape  Eyes: Negative for pain and visual disturbance.  Respiratory: Negative for cough and shortness of breath.   Cardiovascular: Negative for cp or palpitations    Gastrointestinal: Negative for nausea, diarrhea and constipation.  Genitourinary: Negative for urgency and frequency.  Skin: Negative for pallor or rash   Neurological: Negative for weakness, light-headedness, numbness and headaches.  Hematological: Negative for adenopathy. Does not bruise/bleed easily.  Psychiatric/Behavioral: pos for depression and anx symptoms without SI , is a Product/process development scientist .          Objective:   Physical Exam  Constitutional: She appears well-developed and well-nourished.       Obese and well appearing   HENT:  Head: Normocephalic and atraumatic.  Mouth/Throat: Oropharynx is clear and moist.  Eyes: Conjunctivae and EOM are normal. Pupils are equal, round, and reactive to light. No scleral icterus.  Neck: Normal range of motion. Neck supple. No JVD present. Carotid bruit is not present. No thyromegaly present.  Cardiovascular: Normal rate, regular rhythm, normal heart sounds and intact distal pulses.  Exam reveals no gallop.   Pulmonary/Chest: Effort normal and breath sounds normal. No respiratory distress. She has no wheezes.  Abdominal: Soft. Bowel sounds are normal. She exhibits no distension, no abdominal bruit and no mass. There is no tenderness.  Musculoskeletal: Normal range of motion. She exhibits no edema and no tenderness.  Lymphadenopathy:    She has no cervical adenopathy.  Neurological: She is alert. She has normal reflexes. No cranial nerve deficit. Coordination normal.       No tremor   Skin: Skin is warm and dry. No rash noted. No erythema. No pallor.  Psychiatric:       Seems generally anxious today  Declines counseling yet talks constantly about the problems she has with stubborn people in her life and how  disappointing her life is to her  Is difficult to initiate conversations about other things She seems somewhat obsessive  She in general does not listen well She is  quite challenging to talk to  Not tearful and eye contact is fair             Assessment & Plan:

## 2010-12-10 ENCOUNTER — Other Ambulatory Visit (INDEPENDENT_AMBULATORY_CARE_PROVIDER_SITE_OTHER): Payer: Medicare Other

## 2010-12-10 DIAGNOSIS — R7309 Other abnormal glucose: Secondary | ICD-10-CM

## 2010-12-10 DIAGNOSIS — F411 Generalized anxiety disorder: Secondary | ICD-10-CM

## 2010-12-10 DIAGNOSIS — E785 Hyperlipidemia, unspecified: Secondary | ICD-10-CM

## 2010-12-10 DIAGNOSIS — I10 Essential (primary) hypertension: Secondary | ICD-10-CM

## 2010-12-10 LAB — LIPID PANEL
Cholesterol: 196 mg/dL (ref 0–200)
HDL: 45.3 mg/dL (ref >39.00)
LDL Cholesterol: 122 mg/dL — ABNORMAL HIGH (ref 0–99)
Total CHOL/HDL Ratio: 4
Triglycerides: 143 mg/dL (ref 0.0–149.0)
VLDL: 28.6 mg/dL (ref 0.0–40.0)

## 2010-12-10 LAB — TSH: TSH: 0.77 u[IU]/mL (ref 0.35–5.50)

## 2010-12-10 LAB — COMPREHENSIVE METABOLIC PANEL
ALT: 15 U/L (ref 0–35)
AST: 20 U/L (ref 0–37)
Albumin: 3.9 g/dL (ref 3.5–5.2)
Alkaline Phosphatase: 98 U/L (ref 39–117)
BUN: 16 mg/dL (ref 6–23)
CO2: 31 mEq/L (ref 19–32)
Calcium: 9.3 mg/dL (ref 8.4–10.5)
Chloride: 106 mEq/L (ref 96–112)
Creatinine, Ser: 0.8 mg/dL (ref 0.4–1.2)
GFR: 85.41 mL/min (ref >60.00)
Glucose, Bld: 87 mg/dL (ref 70–99)
Potassium: 3.8 mEq/L (ref 3.5–5.1)
Sodium: 144 mEq/L (ref 135–145)
Total Bilirubin: 0.5 mg/dL (ref 0.3–1.2)
Total Protein: 7.2 g/dL (ref 6.0–8.3)

## 2010-12-10 LAB — HEMOGLOBIN A1C: Hgb A1c MFr Bld: 5.8 % (ref 4.6–6.5)

## 2010-12-15 ENCOUNTER — Ambulatory Visit (INDEPENDENT_AMBULATORY_CARE_PROVIDER_SITE_OTHER): Payer: Medicare Other | Admitting: Family Medicine

## 2010-12-15 ENCOUNTER — Encounter: Payer: Self-pay | Admitting: Family Medicine

## 2010-12-15 VITALS — BP 166/84 | HR 76 | Temp 98.0°F | Ht 66.25 in | Wt 238.2 lb

## 2010-12-15 DIAGNOSIS — E785 Hyperlipidemia, unspecified: Secondary | ICD-10-CM

## 2010-12-15 DIAGNOSIS — Z1231 Encounter for screening mammogram for malignant neoplasm of breast: Secondary | ICD-10-CM

## 2010-12-15 DIAGNOSIS — R7309 Other abnormal glucose: Secondary | ICD-10-CM

## 2010-12-15 DIAGNOSIS — I10 Essential (primary) hypertension: Secondary | ICD-10-CM

## 2010-12-15 DIAGNOSIS — F411 Generalized anxiety disorder: Secondary | ICD-10-CM

## 2010-12-15 MED ORDER — FLUOXETINE HCL 20 MG PO CAPS: 20.0000 mg | ORAL_CAPSULE | Freq: Every day | ORAL | Status: DC

## 2010-12-15 MED ORDER — LOSARTAN POTASSIUM 50 MG PO TABS: 50.0000 mg | ORAL_TABLET | Freq: Every day | ORAL | Status: DC

## 2010-12-15 MED ORDER — TRAMADOL HCL 50 MG PO TABS: 50.0000 mg | ORAL_TABLET | Freq: Four times a day (QID) | ORAL | Status: DC | PRN

## 2010-12-15 NOTE — Patient Instructions (Signed)
We will schedule your mammogram at check out  Try the prozac- if side effects or problems- stop it and let me know  Try your best to eat healthy diet and get some exercise  Try to work on your anger issues and accountability  If no improvement in mood - we will refer you to psychiatry  Start cozaar (losartan) for blood pressure and follow up with me in about 6 weeks

## 2010-12-15 NOTE — Progress Notes (Signed)
Subjective:    Patient ID: Monica [name], female    DOB: Apr 04, 1935, 75 y.o.   MRN: [id number]  HPI Here for f/u of HTN and lipids and hyperglycemia and anxiety   Last visit stopped lasix and K Did not make any difference in her urination- still urinates all the time  Gained 3 lb with bmi of 38  Not swelling  bp 166/84- up as  Off the lasix  No edema or ha or palpitation  LDL 122 Lab Results  Component Value Date   CHOL 196 12/10/2010   CHOL 200 12/09/2009   CHOL 211* 06/16/2009   Lab Results  Component Value Date   HDL 45.30 12/10/2010   HDL 39.20 12/09/2009   HDL 42.70 06/16/2009   Lab Results  Component Value Date   LDLCALC 122* 12/10/2010   LDLCALC 136* 06/02/2008   LDLCALC 134* 12/13/2007   Lab Results  Component Value Date   TRIG 143.0 12/10/2010   TRIG 202.0* 12/09/2009   TRIG 246.0* 06/16/2009   Lab Results  Component Value Date   CHOLHDL 4 12/10/2010   CHOLHDL 5 12/09/2009   CHOLHDL 5 06/16/2009   Lab Results  Component Value Date   LDLDIRECT 131.8 12/09/2009   LDLDIRECT 135.4 06/16/2009   LDLDIRECT 137.4 12/10/2008   down a bit  Diet - still struggling with this - just not motivated at all Blames her mood Blames the stress in her life as well as people in her life   a1c better than expected at 5.1- very good  She is surprised by that  Checked sugar at home - came back at 90 or below  Is happy about that   Anxiety - started zoloft again last time Tried it and had side effects - felt like a zombie and sleepy and slowed down  Would rather have a xanax for prn use -- and would not use a pill more than once or twice per month  Has been on prozac in the past - thinks she may want to try that again  Counseling has not helped before    Stress wise she did resign from her choir - thinks that was a good decision  Patient Active Problem List  Diagnoses  . HYPERLIPIDEMIA  . GOUT, UNSPECIFIED  . OBESITY  . ANXIETY  . DEPRESSION  . HYPERTENSION  .  CARDIOMEGALY  . Allergic Rhinitis, Cause Unspecified  . SCIATICA  . SLEEP APNEA  . EDEMA  . OVEREATING  . OSTEOARTHRITIS, KNEES, BILATERAL  . HYPERGLYCEMIA  . Other screening mammogram   Past Medical History  Diagnosis Date  . Allergic rhinitis   . Anxiety   . Depression   . Hyperlipidemia   . HTN (hypertension)   . Obesity   . Compulsive overeating   . OA (osteoarthritis) of knee     with injections  . DM2 (diabetes mellitus, type 2)   . Gout   . Cardiomegaly   . Edema   . Other abnormal glucose   . Sciatica   . Unspecified sleep apnea    Past Surgical History  Procedure Date  . Partial hysterectomy     fibroids  . Breast lumpectomy     right x1 ('89) left x2 ('70s, '90)  . Colonoscopy 11/02    diverticulosis  . US transvaginal pelvic modified 2000, 2001  . Dexa 3/02    normal  . Cardiac catheterization 2001    normal per pt  . Sleep study 5/05  . Colonoscopy  9/06    diverticulosis, hemorhoids  . Adenosine cardiolite 1/08    low risk   . Knee replacement 9/11    R. Dr. Priscille Kluver   History  Substance Use Topics  . Smoking status: Former Games developer  . Smokeless tobacco: Not on file   Comment: quit approx. 20 years ago  . Alcohol Use: No   Family History  Problem Relation Age of Onset  . Gout      whole family   . Stroke Father   . Hypertension Mother     (a lot of animosity in their relationship)  . Prostate cancer Brother   . Hepatitis Brother     Hep C; alcoholism (terminal)  . Breast cancer Sister   . Other Brother     drug addiction   Allergies  Allergen Reactions  . Buspirone Hcl     REACTION: made her sleepy, ? swollen legs  . Lisinopril     REACTION: dizziness   Current Outpatient Prescriptions on File Prior to Visit  Medication Sig Dispense Refill  . allopurinol (ZYLOPRIM) 100 MG tablet Take 100 mg by mouth 2 (two) times daily.        . calcium carbonate (OS-CAL) 600 MG TABS Take 600 mg by mouth 2 (two) times daily.        Marland Kitchen glucose  blood (ACCU-CHEK AVIVA) test strip Use as instructed  100 each  12  . NON FORMULARY Accu check aviva strips and softclix lancets. Use to check blood sugar once daily and PRN for DM. Also Glucometer -check glucose daily and PRN for DM       . Omega-3 Fatty Acids (FISH OIL PO) Take 1 capsule by mouth daily.        Marland Kitchen aspirin 81 MG EC tablet Take 81 mg by mouth daily. HOLD       . colchicine 0.6 MG tablet Take 0.6 mg by mouth 2 (two) times daily. With food       . magnesium oxide (MAG-OX) 400 MG tablet Take 400 mg by mouth daily. Over the counter       . Multiple Vitamins-Minerals (BL BALANCED CARE) TABS Take 1 tablet by mouth daily.             Review of Systems Review of Systems  Constitutional: Negative for fever, appetite change, and unexpected weight change. is fatigued a lot  Eyes: Negative for pain and visual disturbance.  Respiratory: Negative for cough and shortness of breath.   Cardiovascular: Negative for cp or palpitations    Gastrointestinal: Negative for nausea, diarrhea and constipation.  Genitourinary: Negative for urgency and frequency.  Skin: Negative for pallor or rash   Neurological: Negative for weakness, light-headedness, numbness and headaches.  Hematological: Negative for adenopathy. Does not bruise/bleed easily.  Psychiatric/Behavioral: pos for dysphoric mood and anxiety- irritability .          Objective:   Physical Exam  Constitutional: She appears well-developed and well-nourished. No distress.       overwt and well appearing    HENT:  Head: Normocephalic and atraumatic.  Mouth/Throat: Oropharynx is clear and moist.  Eyes: Conjunctivae and EOM are normal. Pupils are equal, round, and reactive to light.  Neck: Normal range of motion. Neck supple. No JVD present. Carotid bruit is not present. No thyromegaly present.  Cardiovascular: Normal rate, regular rhythm, normal heart sounds and intact distal pulses.   Pulmonary/Chest: Effort normal and breath sounds  normal. No respiratory distress. She has no wheezes.  Abdominal: Soft. Bowel sounds are normal.  Musculoskeletal: She exhibits edema. She exhibits no tenderness.       Trace pedal edema   Lymphadenopathy:    She has no cervical adenopathy.  Neurological: She is alert. She has normal reflexes. Coordination normal.       No tremor   Skin: Skin is warm and dry. No rash noted. No erythema. No pallor.  Psychiatric:       Pt emotionally distressed- much anger towards her life situation and toxic people in her life Explains that this takes away all her motivation  Tearful at times Very tangential - hard to focus- cannot get her to answer questions as she changes subjects frequently          Assessment & Plan:

## 2010-12-17 ENCOUNTER — Other Ambulatory Visit: Payer: Self-pay | Admitting: *Deleted

## 2010-12-17 MED ORDER — METOPROLOL SUCCINATE ER 100 MG PO TB24: 100.0000 mg | ORAL_TABLET | Freq: Every day | ORAL | Status: DC

## 2010-12-19 NOTE — Assessment & Plan Note (Signed)
Disc symptoms in length/ social situation/ stressors/ coping tech / med opt and poss side eff Chose to go back on prozac  Rev poss side eff/ red flags  I think she would again benefit from counseling in the future  Disc further at f/u >25 min spent with face to face with patient, >50% counseling and/or coordinating care

## 2010-12-19 NOTE — Assessment & Plan Note (Signed)
Mam scheduled - annual

## 2010-12-19 NOTE — Assessment & Plan Note (Signed)
Overall borderline with other risk factors Again rev low sat fat diet  Rev her labs with her Will continue to monitor

## 2010-12-19 NOTE — Assessment & Plan Note (Signed)
bp up off lasix- and pt tol the mild edema Will try ARB and see how she resp Will call if side eff F/u planned

## 2010-12-19 NOTE — Assessment & Plan Note (Signed)
Better than expected- which is reassuring  Disc goals for a1c and diet  Rev low glycemic diet and imp of exercise She is at high risk of DM

## 2010-12-22 ENCOUNTER — Ambulatory Visit: Payer: Self-pay | Admitting: Ophthalmology

## 2010-12-22 DIAGNOSIS — I119 Hypertensive heart disease without heart failure: Secondary | ICD-10-CM

## 2011-01-05 ENCOUNTER — Ambulatory Visit: Payer: Self-pay | Admitting: Ophthalmology

## 2011-02-01 ENCOUNTER — Ambulatory Visit: Payer: Self-pay | Admitting: General Surgery

## 2011-02-02 ENCOUNTER — Ambulatory Visit (INDEPENDENT_AMBULATORY_CARE_PROVIDER_SITE_OTHER): Payer: Medicare Other | Admitting: Family Medicine

## 2011-02-02 ENCOUNTER — Encounter: Payer: Self-pay | Admitting: Family Medicine

## 2011-02-02 VITALS — BP 178/90 | HR 72 | Temp 98.3°F | Ht 66.25 in | Wt 236.8 lb

## 2011-02-02 DIAGNOSIS — B3731 Acute candidiasis of vulva and vagina: Secondary | ICD-10-CM

## 2011-02-02 DIAGNOSIS — F411 Generalized anxiety disorder: Secondary | ICD-10-CM

## 2011-02-02 DIAGNOSIS — I10 Essential (primary) hypertension: Secondary | ICD-10-CM

## 2011-02-02 DIAGNOSIS — R609 Edema, unspecified: Secondary | ICD-10-CM

## 2011-02-02 DIAGNOSIS — B373 Candidiasis of vulva and vagina: Secondary | ICD-10-CM | POA: Insufficient documentation

## 2011-02-02 MED ORDER — ALPRAZOLAM 0.5 MG PO TABS: 0.5000 mg | ORAL_TABLET | Freq: Every day | ORAL | Status: AC | PRN

## 2011-02-02 MED ORDER — FUROSEMIDE 40 MG PO TABS: 40.0000 mg | ORAL_TABLET | Freq: Every day | ORAL | Status: DC

## 2011-02-02 MED ORDER — TERCONAZOLE 0.8 % VA CREA: TOPICAL_CREAM | VAGINAL | Status: DC

## 2011-02-02 MED ORDER — FLUCONAZOLE 150 MG PO TABS: 150.0000 mg | ORAL_TABLET | Freq: Once | ORAL | Status: AC

## 2011-02-02 NOTE — Assessment & Plan Note (Signed)
Pos wet prep  tx with diflucan 150 times one and terazol Update if not imp

## 2011-02-02 NOTE — Assessment & Plan Note (Signed)
Not helped by prozac Situational stress discussed I feel pt has maladaptive coping skills but she refuses to work on them Declines counseling or psychiatry  Will refil 15 xanax for emergencies Will stop prozac

## 2011-02-02 NOTE — Assessment & Plan Note (Signed)
Still out of control Will add back some lasix at 40  Continue cozaar F/u 1 mo  emph imp of wt loss and exercise

## 2011-02-02 NOTE — Progress Notes (Signed)
Subjective:    Patient ID: Monica [name], female    DOB: 04-25-1935, 75 y.o.   MRN: [id number]  HPI Here for f/u of HTN and anxiety and also for new perineal itching   bp is  178/90   Today Does not check at home  Still quite high Last visit started cozaar 50 No cp or palpitations or headaches or edema  No side effects to medicines   Wants to go back on the lasix   Tired and exhausted  Brain runs fast but body is just dragging   Started prozac for anx and depression Not tolerating well -- needs to stop it  Not helping with anxiety at all  So much going on   Does not want to see a counselor or psychiatrist  Needs xanax for absolute emergencies only -- helps her to know she has it  Is reading a book - about brain power  Hopes it will help Stopped some of her outside activities like the church choir Blames the selfish pastor and parishoners for making her miserable Family also makes her miserable Continues to reward herself with food and cannot seem to stop this     Lost a few lb  Itching- ? Yeast infection  Tends to stay wet due to incontinence problems - using panty gaurds and depends  Is irritated because she cuts them in 1/2  Some discharge - yellow to white  Not drinking enough water   Patient Active Problem List  Diagnoses  . HYPERLIPIDEMIA  . GOUT, UNSPECIFIED  . OBESITY  . ANXIETY  . DEPRESSION  . HYPERTENSION  . CARDIOMEGALY  . Allergic Rhinitis, Cause Unspecified  . SCIATICA  . SLEEP APNEA  . EDEMA  . OVEREATING  . OSTEOARTHRITIS, KNEES, BILATERAL  . HYPERGLYCEMIA  . Other screening mammogram  . Yeast vaginitis   Past Medical History  Diagnosis Date  . Allergic rhinitis   . Anxiety   . Depression   . Hyperlipidemia   . HTN (hypertension)   . Obesity   . Compulsive overeating   . OA (osteoarthritis) of knee     with injections  . DM2 (diabetes mellitus, type 2)   . Gout   . Cardiomegaly   . Edema   . Other abnormal glucose   .  Sciatica   . Unspecified sleep apnea    Past Surgical History  Procedure Date  . Partial hysterectomy     fibroids  . Breast lumpectomy     right x1 ('89) left x2 ('70s, '90)  . Colonoscopy 11/02    diverticulosis  . US transvaginal pelvic modified 2000, 2001  . Dexa 3/02    normal  . Cardiac catheterization 2001    normal per pt  . Sleep study 5/05  . Colonoscopy 9/06    diverticulosis, hemorhoids  . Adenosine cardiolite 1/08    low risk   . Knee replacement 9/11    R. Dr. Priscille Kluver   History  Substance Use Topics  . Smoking status: Former Games developer  . Smokeless tobacco: Not on file   Comment: quit approx. 20 years ago  . Alcohol Use: No   Family History  Problem Relation Age of Onset  . Gout      whole family   . Stroke Father   . Hypertension Mother     (a lot of animosity in their relationship)  . Prostate cancer Brother   . Hepatitis Brother     Hep C; alcoholism (terminal)  .  Breast cancer Sister   . Other Brother     drug addiction   Allergies  Allergen Reactions  . Buspirone Hcl     REACTION: made her sleepy, ? swollen legs  . Lisinopril     REACTION: dizziness  . Prozac (Fluoxetine Hcl)     sleepy   Current Outpatient Prescriptions on File Prior to Visit  Medication Sig Dispense Refill  . allopurinol (ZYLOPRIM) 100 MG tablet Take 100 mg by mouth daily.       . calcium carbonate (OS-CAL) 600 MG TABS Take 600 mg by mouth daily.       Marland Kitchen glucose blood (ACCU-CHEK AVIVA) test strip Use as instructed  100 each  12  . losartan (COZAAR) 50 MG tablet Take 1 tablet (50 mg total) by mouth daily.  30 tablet  11  . metoprolol (TOPROL-XL) 100 MG 24 hr tablet Take 1 tablet (100 mg total) by mouth daily.  30 tablet  11  . Omega-3 Fatty Acids (FISH OIL PO) Take 1 capsule by mouth daily.        Marland Kitchen aspirin 81 MG EC tablet Take 81 mg by mouth daily. HOLD       . colchicine 0.6 MG tablet Take 0.6 mg by mouth 2 (two) times daily. With food       . magnesium oxide (MAG-OX)  400 MG tablet Take 400 mg by mouth daily. Over the counter       . Multiple Vitamins-Minerals (BL BALANCED CARE) TABS Take 1 tablet by mouth daily.        . NON FORMULARY Accu check aviva strips and softclix lancets. Use to check blood sugar once daily and PRN for DM. Also Glucometer -check glucose daily and PRN for DM       . traMADol (ULTRAM) 50 MG tablet Take 1 tablet (50 mg total) by mouth every 6 (six) hours as needed.  30 tablet  1        Review of Systems Review of Systems  Constitutional: Negative for fever, appetite change,  and unexpected weight change. pos for chronic fatigue/ never feels good  Eyes: Negative for pain and visual disturbance.  Respiratory: Negative for cough and shortness of breath.   Cardiovascular: Negative for cp or palpitations    Gastrointestinal: Negative for nausea, diarrhea and constipation.  Genitourinary: Negative for urgency and pos for baseline frequency and incontinence  Skin: Negative for pallor or rash   Neurological: Negative for weakness, light-headedness, numbness and headaches.  Hematological: Negative for adenopathy. Does not bruise/bleed easily.  Psychiatric/Behavioral: pos for dysphoric mood and anxiety - no SI however         Objective:   Physical Exam  Constitutional: She appears well-developed and well-nourished. No distress.       Obese and well appearing   HENT:  Head: Normocephalic and atraumatic.  Mouth/Throat: Oropharynx is clear and moist. No oropharyngeal exudate.  Eyes: Conjunctivae and EOM are normal. Pupils are equal, round, and reactive to light. No scleral icterus.  Neck: Normal range of motion. Neck supple. No JVD present. Carotid bruit is not present. No thyromegaly present.  Cardiovascular: Normal rate, regular rhythm, normal heart sounds and intact distal pulses.  Exam reveals no gallop.   Pulmonary/Chest: Effort normal and breath sounds normal. No respiratory distress. She has no wheezes.  Abdominal: Soft. Bowel  sounds are normal. She exhibits no distension, no abdominal bruit and no mass. There is no tenderness.  Genitourinary: There is tenderness on the right  labia. There is no lesion on the right labia. There is tenderness on the left labia. There is no lesion on the left labia. There is erythema and tenderness around the vagina. Vaginal discharge found.       Mild vaginal erythema/ injection with scant discharge No external excoriation  Very sensitive  Swab taken for wet prep  Musculoskeletal: Normal range of motion. She exhibits edema. She exhibits no tenderness.       One plus pedal edema bilat  Pt is generally tender all over - very sensitive to touch  Lymphadenopathy:    She has no cervical adenopathy.  Neurological: She is alert. She displays no tremor. No cranial nerve deficit. She exhibits normal muscle tone. Coordination normal.  Skin: Skin is warm and dry. No rash noted. No erythema. No pallor.  Psychiatric: Her behavior is normal. Thought content normal.       Pt is generally dysphoric and down  Talks repeatedly about her church and problems with it  Has much anger in general  Voices feeling sorry for herself as well  Eye contact fair Affect is still animated No SI  She can not be convinced to change her lifestyle or seek counseling or psychiatric care  Very difficult to communicate with and does not listen well           Assessment & Plan:

## 2011-02-02 NOTE — Patient Instructions (Addendum)
Stop the prozac  Add back lasix (furosemide) since blood pressure is so high Work to avoid sodium in the diet  Think about exercise Follow up in 1 month for blood pressure  Use the diflucan and terazol cream for yeast  Use xanax for emergenies only I sent all px to [facility]

## 2011-02-02 NOTE — Assessment & Plan Note (Signed)
Still a problem Will start back on lasix at smaller dose 40 F/u 1 mo  Lab for K at that time

## 2011-02-03 LAB — POCT WET PREP (WET MOUNT)
KOH Wet Prep POC: POSITIVE
Trichomonas Wet Prep HPF POC: NEGATIVE

## 2011-02-04 ENCOUNTER — Ambulatory Visit: Payer: Self-pay | Admitting: General Surgery

## 2011-02-22 DIAGNOSIS — D249 Benign neoplasm of unspecified breast: Secondary | ICD-10-CM

## 2011-02-22 DIAGNOSIS — N6019 Diffuse cystic mastopathy of unspecified breast: Secondary | ICD-10-CM

## 2011-02-22 HISTORY — DX: Diffuse cystic mastopathy of unspecified breast: N60.19

## 2011-02-22 HISTORY — DX: Benign neoplasm of unspecified breast: D24.9

## 2011-02-28 ENCOUNTER — Ambulatory Visit: Payer: Self-pay | Admitting: General Surgery

## 2011-03-01 LAB — PATHOLOGY REPORT

## 2011-03-09 ENCOUNTER — Other Ambulatory Visit: Payer: Self-pay

## 2011-03-09 MED ORDER — ALLOPURINOL 100 MG PO TABS: 100.0000 mg | ORAL_TABLET | Freq: Every day | ORAL | Status: DC

## 2011-03-09 NOTE — Telephone Encounter (Signed)
Will refill electronically  

## 2011-03-09 NOTE — Telephone Encounter (Signed)
[facility] faxed refill request for allopurinol 100 mg last filled 01/14/11. Pt last seen 02/02/11.Please advise.

## 2011-03-11 ENCOUNTER — Encounter: Payer: Self-pay | Admitting: Family Medicine

## 2011-03-11 ENCOUNTER — Ambulatory Visit (INDEPENDENT_AMBULATORY_CARE_PROVIDER_SITE_OTHER): Payer: Medicare Other | Admitting: Family Medicine

## 2011-03-11 VITALS — BP 140/80 | HR 84 | Temp 98.0°F | Ht 66.5 in | Wt 245.0 lb

## 2011-03-11 DIAGNOSIS — F411 Generalized anxiety disorder: Secondary | ICD-10-CM

## 2011-03-11 DIAGNOSIS — I10 Essential (primary) hypertension: Secondary | ICD-10-CM

## 2011-03-11 LAB — POTASSIUM: Potassium: 3.4 mEq/L — ABNORMAL LOW (ref 3.5–5.1)

## 2011-03-11 NOTE — Assessment & Plan Note (Signed)
Much improved with addn of lasix K level today Edema better  Disc imp of wt loss and low sodium diet F/u 53mo

## 2011-03-11 NOTE — Patient Instructions (Signed)
Continue current medicines  Labs for potassium today  Follow up in about 6 months  I'm glad you are feeling better

## 2011-03-11 NOTE — Progress Notes (Signed)
Subjective:    Patient ID: Monica [name], female    DOB: [birth year], 76 y.o.   MRN: [id number]  HPI Here for f/u of HTN and anxiety  bp much better today 140/80  Is also lower at home than here  No cp or palpitations or headaches or edema  No side effects to medicines   Did re start lasix 40 for this and edema this time (less than she was before)- is tolerating it   Does have frequent irritation    ? Gained wt 9 lb with bmi of 38 this time Ready to start working on her wt loss   Really feels good overall  Mood is much better , even though stressed-mother will have to come live with her Getting along better with her   Patient Active Problem List  Diagnoses  . HYPERLIPIDEMIA  . GOUT, UNSPECIFIED  . OBESITY  . ANXIETY  . DEPRESSION  . HYPERTENSION  . CARDIOMEGALY  . Allergic Rhinitis, Cause Unspecified  . SCIATICA  . SLEEP APNEA  . EDEMA  . OVEREATING  . OSTEOARTHRITIS, KNEES, BILATERAL  . HYPERGLYCEMIA  . Other screening mammogram  . Yeast vaginitis   Past Medical History  Diagnosis Date  . Allergic rhinitis   . Anxiety   . Depression   . Hyperlipidemia   . HTN (hypertension)   . Obesity   . Compulsive overeating   . OA (osteoarthritis) of knee     with injections  . DM2 (diabetes mellitus, type 2)   . Gout   . Cardiomegaly   . Edema   . Other abnormal glucose   . Sciatica   . Unspecified sleep apnea    Past Surgical History  Procedure Date  . Partial hysterectomy     fibroids  . Breast lumpectomy     right x1 ('89) left x2 ('70s, '90)  . Colonoscopy 11/02    diverticulosis  . US transvaginal pelvic modified 2000, 2001  . Dexa 3/02    normal  . Cardiac catheterization 2001    normal per pt  . Sleep study 5/05  . Colonoscopy 9/06    diverticulosis, hemorhoids  . Adenosine cardiolite 1/08    low risk   . Knee replacement 9/11    R. Dr. Priscille Kluver   History  Substance Use Topics  . Smoking status: Former Games developer  . Smokeless tobacco: Not  on file   Comment: quit approx. 20 years ago  . Alcohol Use: No   Family History  Problem Relation Age of Onset  . Gout      whole family   . Stroke Father   . Hypertension Mother     (a lot of animosity in their relationship)  . Prostate cancer Brother   . Hepatitis Brother     Hep C; alcoholism (terminal)  . Breast cancer Sister   . Other Brother     drug addiction   Allergies  Allergen Reactions  . Buspirone Hcl     REACTION: made her sleepy, ? swollen legs  . Lisinopril     REACTION: dizziness  . Prozac (Fluoxetine Hcl)     sleepy   Current Outpatient Prescriptions on File Prior to Visit  Medication Sig Dispense Refill  . allopurinol (ZYLOPRIM) 100 MG tablet Take 1 tablet (100 mg total) by mouth daily.  30 tablet  11  . ALPRAZolam (XANAX) 0.5 MG tablet Take 1 tablet (0.5 mg total) by mouth daily as needed for anxiety.  15 tablet  0  .  aspirin 81 MG EC tablet Take 81 mg by mouth daily. HOLD       . calcium carbonate (OS-CAL) 600 MG TABS Take 600 mg by mouth daily.       . colchicine 0.6 MG tablet Take 0.6 mg by mouth 2 (two) times daily. With food       . furosemide (LASIX) 40 MG tablet Take 1 tablet (40 mg total) by mouth daily.  30 tablet  11  . glucose blood (ACCU-CHEK AVIVA) test strip Use as instructed  100 each  12  . losartan (COZAAR) 50 MG tablet Take 1 tablet (50 mg total) by mouth daily.  30 tablet  11  . magnesium oxide (MAG-OX) 400 MG tablet Take 400 mg by mouth daily. Over the counter       . metoprolol (TOPROL-XL) 100 MG 24 hr tablet Take 1 tablet (100 mg total) by mouth daily.  30 tablet  11  . Multiple Vitamins-Minerals (BL BALANCED CARE) TABS Take 1 tablet by mouth daily.        Marland Kitchen NEVANAC 0.1 % ophthalmic suspension Place 2 drops into the left eye Daily.      . NON FORMULARY Accu check aviva strips and softclix lancets. Use to check blood sugar once daily and PRN for DM. Also Glucometer -check glucose daily and PRN for DM       . Omega-3 Fatty Acids  (FISH OIL PO) Take 1 capsule by mouth daily.        Marland Kitchen terconazole (TERAZOL 3) 0.8 % vaginal cream Apply to affected area daily as needed for itching from yeast infection  20 g  0  . traMADol (ULTRAM) 50 MG tablet Take 1 tablet (50 mg total) by mouth every 6 (six) hours as needed.  30 tablet  1      Review of Systems Review of Systems  Constitutional: Negative for fever, appetite change, fatigue and unexpected weight change.  Eyes: Negative for pain and visual disturbance.  Respiratory: Negative for cough and shortness of breath.   Cardiovascular: Negative for cp or palpitations    Gastrointestinal: Negative for nausea, diarrhea and constipation.  Genitourinary: Negative for urgency and frequency.  Skin: Negative for pallor or rash   Neurological: Negative for weakness, light-headedness, numbness and headaches.  Hematological: Negative for adenopathy. Does not bruise/bleed easily.  Psychiatric/Behavioral: Negative for dysphoric mood. The patient is anxious due to situational stresss         Objective:   Physical Exam  Constitutional: She appears well-developed and well-nourished. No distress.       Obese and well appearing   HENT:  Head: Normocephalic and atraumatic.  Mouth/Throat: Oropharynx is clear and moist.  Eyes: Conjunctivae and EOM are normal. Pupils are equal, round, and reactive to light. No scleral icterus.  Neck: Normal range of motion. Neck supple. No JVD present. Carotid bruit is not present. No thyromegaly present.  Cardiovascular: Normal rate, regular rhythm and normal heart sounds.   Pulmonary/Chest: Effort normal and breath sounds normal. No respiratory distress. She has no wheezes.  Abdominal: Soft. Bowel sounds are normal. She exhibits no abdominal bruit.  Musculoskeletal: She exhibits no edema.  Lymphadenopathy:    She has no cervical adenopathy.  Neurological: She is alert. She has normal reflexes.  Skin: Skin is warm and dry. No rash noted. No erythema. No  pallor.  Psychiatric:       Baseline slt anxious Not tearful today Talks freely of stressors  Assessment & Plan:

## 2011-03-11 NOTE — Assessment & Plan Note (Signed)
Improved despite emotional eating and wt loss In good spirits today Disc lifestyle habits F/u 6 mo

## 2011-03-17 ENCOUNTER — Telehealth: Payer: Self-pay

## 2011-03-17 MED ORDER — POTASSIUM CHLORIDE CRYS ER 20 MEQ PO TBCR: 20.0000 meq | EXTENDED_RELEASE_TABLET | Freq: Every day | ORAL | Status: DC

## 2011-03-17 NOTE — Telephone Encounter (Signed)
Potassium Cl 20 meq added back to med list taking 1 daily #30 x 11. See recent result note where instructed to add KCL 20 meq back to med list. Patient notified as instructed by telephone. Pt will ck with [facility] for refill.

## 2011-08-08 ENCOUNTER — Ambulatory Visit (INDEPENDENT_AMBULATORY_CARE_PROVIDER_SITE_OTHER): Payer: Medicare Other | Admitting: Family Medicine

## 2011-08-08 ENCOUNTER — Encounter: Payer: Self-pay | Admitting: Family Medicine

## 2011-08-08 VITALS — BP 138/88 | HR 82 | Temp 98.1°F | Ht 66.0 in | Wt 246.2 lb

## 2011-08-08 DIAGNOSIS — R21 Rash and other nonspecific skin eruption: Secondary | ICD-10-CM

## 2011-08-08 NOTE — Patient Instructions (Addendum)
Take zyrtec 10 mg once daily for itch until the rash is better (this will help post nasal drip too)  If you use soap or lotion - make it color free and fragrance free (like dove for sensitive skin and lubriderm for sensitive skin )  It may take time for this to get better  If worse -- we may have to try prednisone  If you get worse or have wheezing or mouth/ throat swelling - call and seek care

## 2011-08-08 NOTE — Progress Notes (Signed)
Subjective:    Patient ID: Monica [name], female    DOB: 23-May-1935, 76 y.o.   MRN: [id number]  HPI Here for possible allergic rxn  No problems with fish oil in past  Bought a new bottle of "whole mega"   This has multiple omega 3 supplements - more comprehensive than last fish oil  Eats fish- no problems Is all poss to shellfish  Rash is very itchy   Started on upper body about 2 weeks ago  Started supplement several days before   Took some benadryl Used gold bond  Stopped the med 2 d ago  Was also around a cat   Patient Active Problem List  Diagnosis  . HYPERLIPIDEMIA  . GOUT, UNSPECIFIED  . OBESITY  . ANXIETY  . DEPRESSION  . HYPERTENSION  . CARDIOMEGALY  . Allergic Rhinitis, Cause Unspecified  . SCIATICA  . SLEEP APNEA  . EDEMA  . OVEREATING  . OSTEOARTHRITIS, KNEES, BILATERAL  . HYPERGLYCEMIA  . Other screening mammogram   Past Medical History  Diagnosis Date  . Allergic rhinitis   . Anxiety   . Depression   . Hyperlipidemia   . HTN (hypertension)   . Obesity   . Compulsive overeating   . OA (osteoarthritis) of knee     with injections  . DM2 (diabetes mellitus, type 2)   . Gout   . Cardiomegaly   . Edema   . Other abnormal glucose   . Sciatica   . Unspecified sleep apnea    Past Surgical History  Procedure Date  . Partial hysterectomy     fibroids  . Breast lumpectomy     right x1 ('89) left x2 ('70s, '90)  . Colonoscopy 11/02    diverticulosis  . US transvaginal pelvic modified 2000, 2001  . Dexa 3/02    normal  . Cardiac catheterization 2001    normal per pt  . Sleep study 5/05  . Colonoscopy 9/06    diverticulosis, hemorhoids  . Adenosine cardiolite 1/08    low risk   . Knee replacement 9/11    R. Dr. Priscille Kluver   History  Substance Use Topics  . Smoking status: Former Games developer  . Smokeless tobacco: Not on file   Comment: quit approx. 20 years ago  . Alcohol Use: No   Family History  Problem Relation Age of Onset  . Gout       whole family   . Stroke Father   . Hypertension Mother     (a lot of animosity in their relationship)  . Prostate cancer Brother   . Hepatitis Brother     Hep C; alcoholism (terminal)  . Breast cancer Sister   . Other Brother     drug addiction   Allergies  Allergen Reactions  . Buspirone Hcl     REACTION: made her sleepy, ? swollen legs  . Lisinopril     REACTION: dizziness  . Prozac (Fluoxetine Hcl)     sleepy   Current Outpatient Prescriptions on File Prior to Visit  Medication Sig Dispense Refill  . allopurinol (ZYLOPRIM) 100 MG tablet Take 1 tablet (100 mg total) by mouth daily.  30 tablet  11  . ALPRAZolam (XANAX) 0.5 MG tablet Take 1 tablet (0.5 mg total) by mouth daily as needed for anxiety.  15 tablet  0  . calcium carbonate (OS-CAL) 600 MG TABS Take 600 mg by mouth daily.       . colchicine 0.6 MG tablet Take 0.6 mg  by mouth 2 (two) times daily as needed. With food      . diphenhydrAMINE (BENADRYL) 25 MG tablet Take 25 mg by mouth every 6 (six) hours as needed.      . furosemide (LASIX) 40 MG tablet Take 1 tablet (40 mg total) by mouth daily.  30 tablet  11  . glucose blood (ACCU-CHEK AVIVA) test strip Use as instructed  100 each  12  . losartan (COZAAR) 50 MG tablet Take 1 tablet (50 mg total) by mouth daily.  30 tablet  11  . magnesium oxide (MAG-OX) 400 MG tablet Take 400 mg by mouth daily. Over the counter       . metoprolol (TOPROL-XL) 100 MG 24 hr tablet Take 1 tablet (100 mg total) by mouth daily.  30 tablet  11  . NON FORMULARY Accu check aviva strips and softclix lancets. Use to check blood sugar once daily and PRN for DM. Also Glucometer -check glucose daily and PRN for DM       . Omega-3 Fatty Acids (FISH OIL PO) Take 1 capsule by mouth daily.        . potassium chloride SA (K-DUR,KLOR-CON) 20 MEQ tablet Take 1 tablet (20 mEq total) by mouth daily.  30 tablet  11  . terconazole (TERAZOL 3) 0.8 % vaginal cream Apply to affected area daily as needed for  itching from yeast infection  20 g  0  . traMADol (ULTRAM) 50 MG tablet Take 1 tablet (50 mg total) by mouth every 6 (six) hours as needed.  30 tablet  1  . aspirin 81 MG EC tablet Take 81 mg by mouth daily. HOLD       . Multiple Vitamins-Minerals (BL BALANCED CARE) TABS Take 1 tablet by mouth daily.        Marland Kitchen NEVANAC 0.1 % ophthalmic suspension Place 2 drops into the left eye Daily.          Review of Systems Review of Systems  Constitutional: Negative for fever, appetite change, fatigue and unexpected weight change.  Eyes: Negative for pain and visual disturbance.  Respiratory: Negative for cough and shortness of breath.   Cardiovascular: Negative for cp or palpitations    Gastrointestinal: Negative for nausea, diarrhea and constipation.  Genitourinary: Negative for urgency and frequency.  Skin: Negative for pallor and pos for rash with itching  Neurological: Negative for weakness, light-headedness, numbness and headaches.  Hematological: Negative for adenopathy. Does not bruise/bleed easily.  Psychiatric/Behavioral: Negative for dysphoric mood. The patient is not nervous/anxious.         Objective:   Physical Exam  Constitutional: She appears well-developed and well-nourished. No distress.  HENT:  Head: Normocephalic and atraumatic.  Nose: Nose normal.  Mouth/Throat: Oropharynx is clear and moist. No oropharyngeal exudate.       No mouth/ tongue or throat swelling   Eyes: Conjunctivae and EOM are normal. Pupils are equal, round, and reactive to light. Right eye exhibits no discharge. No scleral icterus.  Neck: Normal range of motion. Neck supple. No JVD present. No tracheal deviation present. No thyromegaly present.  Cardiovascular: Normal rate and regular rhythm.   Pulmonary/Chest: Effort normal and breath sounds normal. No respiratory distress. She has no wheezes.  Lymphadenopathy:    She has no cervical adenopathy.  Neurological: She is alert.  Skin: Skin is warm and dry.  Rash noted. There is erythema. No pallor.       Diffuse rash- erythema with whelps (and some papules) on arms /  ant chest and upper back slt on neck  No other areas No skin interruption or insect bites seen  Psychiatric: She has a normal mood and affect.          Assessment & Plan:

## 2011-08-08 NOTE — Assessment & Plan Note (Signed)
Strongly suspect reaction to new omega 3 supplement from [place]  Will continue to hold it  Adv zyrtec 10 mg daily for itch To keep cool  Avoid fragrance/color in products/ avoid hot detergents Aware to seek care if wheeze or tongue swelling  Update if not starting to improve in 1-2 weeks or if worsening

## 2011-08-11 ENCOUNTER — Ambulatory Visit: Payer: Self-pay | Admitting: General Surgery

## 2011-09-07 ENCOUNTER — Telehealth: Payer: Self-pay

## 2011-09-07 NOTE — Telephone Encounter (Signed)
We can discuss that at the visit - thanks for the heads up  If she thinks she has a yeast infection in the meantime (vag disch/ itching) - can try monistat otc before her visit

## 2011-09-07 NOTE — Telephone Encounter (Signed)
Pt already has f/u appt scheduled with Dr Milinda Antis 09/09/11; pt request perineal irritation checked at that appt also. No vaginal discharge. Pt does not need call back if OK to add to 09/09/11 visit.

## 2011-09-09 ENCOUNTER — Ambulatory Visit (INDEPENDENT_AMBULATORY_CARE_PROVIDER_SITE_OTHER): Payer: Medicare Other | Admitting: Family Medicine

## 2011-09-09 ENCOUNTER — Encounter: Payer: Self-pay | Admitting: Family Medicine

## 2011-09-09 VITALS — BP 140/82 | HR 87 | Temp 98.1°F | Ht 66.0 in | Wt 252.2 lb

## 2011-09-09 DIAGNOSIS — N3941 Urge incontinence: Secondary | ICD-10-CM

## 2011-09-09 DIAGNOSIS — B3731 Acute candidiasis of vulva and vagina: Secondary | ICD-10-CM

## 2011-09-09 DIAGNOSIS — B373 Candidiasis of vulva and vagina: Secondary | ICD-10-CM | POA: Insufficient documentation

## 2011-09-09 HISTORY — DX: Urge incontinence: N39.41

## 2011-09-09 MED ORDER — FLUCONAZOLE 150 MG PO TABS: 150.0000 mg | ORAL_TABLET | Freq: Once | ORAL | Status: AC

## 2011-09-09 NOTE — Assessment & Plan Note (Signed)
Likely from recent prednisone/hyperglycemia and constant wetness from incontinence tx with diflucan and update  Disc imp of keeping dry and avoiding harsh soaps and detergents

## 2011-09-09 NOTE — Progress Notes (Signed)
Subjective:    Patient ID: Monica [name], female    DOB: 1935-07-21, 76 y.o.   MRN: [id number]  HPI Here for f/u of chronic problems and also skin condition After last visit it got worse out of town [place] to ER -- had to take prednisone and that cleared it up (also and ointment)  Still does not know what she was actually allergic to  (? If it was omega 3)  Rash is better   Is now having skin issues in perineal area  Has some burning , irritation, some itching , little bit of pain No bleeding or discharge  Going on since before the prednisone  She is using flushable wipes - outside and a little inside  Also wears pad - has leakage of urine   She would consider a bladder tack for her incontinence  Has urge incont , not stress incontinence  Is afraid to go any where  Has tried kegel exercise   bp is  ok   Today No cp or palpitations or headaches or edema  No side effects to medicines   BP Readings from Last 3 Encounters:  09/09/11 140/82  08/08/11 138/88  03/11/11 140/80     Lab Results  Component Value Date   HGBA1C 5.8 12/10/2010     Has gained 6 lb with bmi of 40 now Continues to gain   Patient Active Problem List  Diagnosis  . HYPERLIPIDEMIA  . GOUT, UNSPECIFIED  . OBESITY  . ANXIETY  . DEPRESSION  . HYPERTENSION  . CARDIOMEGALY  . Allergic Rhinitis, Cause Unspecified  . SCIATICA  . SLEEP APNEA  . EDEMA  . OVEREATING  . OSTEOARTHRITIS, KNEES, BILATERAL  . HYPERGLYCEMIA  . Other screening mammogram  . Rash and nonspecific skin eruption   Past Medical History  Diagnosis Date  . Allergic rhinitis   . Anxiety   . Depression   . Hyperlipidemia   . HTN (hypertension)   . Obesity   . Compulsive overeating   . OA (osteoarthritis) of knee     with injections  . DM2 (diabetes mellitus, type 2)   . Gout   . Cardiomegaly   . Edema   . Other abnormal glucose   . Sciatica   . Unspecified sleep apnea    Past Surgical History  Procedure Date  .  Partial hysterectomy     fibroids  . Breast lumpectomy     right x1 ('89) left x2 ('70s, '90)  . Colonoscopy 11/02    diverticulosis  . US transvaginal pelvic modified 2000, 2001  . Dexa 3/02    normal  . Cardiac catheterization 2001    normal per pt  . Sleep study 5/05  . Colonoscopy 9/06    diverticulosis, hemorhoids  . Adenosine cardiolite 1/08    low risk   . Knee replacement 9/11    R. Dr. Priscille Kluver   History  Substance Use Topics  . Smoking status: Former Games developer  . Smokeless tobacco: Not on file   Comment: quit approx. 20 years ago  . Alcohol Use: No   Family History  Problem Relation Age of Onset  . Gout      whole family   . Stroke Father   . Hypertension Mother     (a lot of animosity in their relationship)  . Prostate cancer Brother   . Hepatitis Brother     Hep C; alcoholism (terminal)  . Breast cancer Sister   . Other Brother  drug addiction   Allergies  Allergen Reactions  . Buspirone Hcl     REACTION: made her sleepy, ? swollen legs  . Lisinopril     REACTION: dizziness  . Prozac (Fluoxetine Hcl)     sleepy   Current Outpatient Prescriptions on File Prior to Visit  Medication Sig Dispense Refill  . allopurinol (ZYLOPRIM) 100 MG tablet Take 1 tablet (100 mg total) by mouth daily.  30 tablet  11  . ALPRAZolam (XANAX) 0.5 MG tablet Take 1 tablet (0.5 mg total) by mouth daily as needed for anxiety.  15 tablet  0  . aspirin 81 MG EC tablet Take 81 mg by mouth daily. HOLD       . colchicine 0.6 MG tablet Take 0.6 mg by mouth 2 (two) times daily as needed. With food      . furosemide (LASIX) 40 MG tablet Take 1 tablet (40 mg total) by mouth daily.  30 tablet  11  . glucose blood (ACCU-CHEK AVIVA) test strip Use as instructed  100 each  12  . losartan (COZAAR) 50 MG tablet Take 1 tablet (50 mg total) by mouth daily.  30 tablet  11  . magnesium oxide (MAG-OX) 400 MG tablet Take 400 mg by mouth daily. Over the counter       . metoprolol (TOPROL-XL) 100  MG 24 hr tablet Take 1 tablet (100 mg total) by mouth daily.  30 tablet  11  . NEVANAC 0.1 % ophthalmic suspension Place 2 drops into the left eye Daily.      . Omega-3 Fatty Acids (FISH OIL PO) Take 1 capsule by mouth daily.        . potassium chloride SA (K-DUR,KLOR-CON) 20 MEQ tablet Take 1 tablet (20 mEq total) by mouth daily.  30 tablet  11  . traMADol (ULTRAM) 50 MG tablet Take 1 tablet (50 mg total) by mouth every 6 (six) hours as needed.  30 tablet  1  . calcium carbonate (OS-CAL) 600 MG TABS Take 600 mg by mouth daily.       . diphenhydrAMINE (BENADRYL) 25 MG tablet Take 25 mg by mouth every 6 (six) hours as needed.      . Multiple Vitamins-Minerals (BL BALANCED CARE) TABS Take 1 tablet by mouth daily.        . NON FORMULARY Accu check aviva strips and softclix lancets. Use to check blood sugar once daily and PRN for DM. Also Glucometer -check glucose daily and PRN for DM       . terconazole (TERAZOL 3) 0.8 % vaginal cream Apply to affected area daily as needed for itching from yeast infection  20 g  0        Review of Systems Review of Systems  Constitutional: Negative for fever, appetite change, fatigue and unexpected weight change.  Eyes: Negative for pain and visual disturbance.  Respiratory: Negative for cough and shortness of breath.   Cardiovascular: Negative for cp or palpitations    Gastrointestinal: Negative for nausea, diarrhea and constipation.  Genitourinary: Negative for urgency and frequency. pos for perineal itch/ burning /pos for urge incontinence of urine, neg for stress incont Skin: Negative for pallor or rash   Neurological: Negative for weakness, light-headedness, numbness and headaches.  Hematological: Negative for adenopathy. Does not bruise/bleed easily.  Psychiatric/Behavioral: Negative for dysphoric mood. The patient is not nervous/anxious.         Objective:   Physical Exam  Constitutional: She appears well-developed and well-nourished. No  distress.       Obese and well appearing   HENT:  Head: Normocephalic and atraumatic.  Mouth/Throat: Oropharynx is clear and moist.  Eyes: Conjunctivae and EOM are normal. Pupils are equal, round, and reactive to light. No scleral icterus.  Neck: Normal range of motion. Neck supple.  Cardiovascular: Normal rate and regular rhythm.   Pulmonary/Chest: Effort normal and breath sounds normal.  Abdominal: Soft. Bowel sounds are normal. She exhibits no distension and no mass. There is no tenderness.       No suprapubic tenderness    Genitourinary: Vaginal discharge found.       Mildly hyperemic labia minora No excoriations  Scant cloudy discharge   Lymphadenopathy:    She has no cervical adenopathy.  Neurological: She is alert. She has normal reflexes.  Skin: Skin is warm and dry. No pallor.  Psychiatric: She has a normal mood and affect.          Assessment & Plan:

## 2011-09-09 NOTE — Patient Instructions (Addendum)
You have a vaginal yeast infection take the diflucan for that  Clean area only with water and dry thoroughly , and avoid baby wipe  We will do referral to urology for your incontinence issues Keep working on low sugar diet and weight loss

## 2011-09-09 NOTE — Assessment & Plan Note (Signed)
Pt is interested in disc tx options with a urologist for this I did recommend wt loss also  Ref made

## 2011-09-11 LAB — POCT WET PREP (WET MOUNT)

## 2011-12-17 ENCOUNTER — Other Ambulatory Visit: Payer: Self-pay | Admitting: Family Medicine

## 2011-12-19 NOTE — Telephone Encounter (Signed)
Ok to refill? No recent labs no future appt

## 2011-12-19 NOTE — Telephone Encounter (Signed)
Please refil both for 12 mo , thanks

## 2012-01-10 ENCOUNTER — Encounter: Payer: Self-pay | Admitting: Family Medicine

## 2012-01-10 ENCOUNTER — Ambulatory Visit (INDEPENDENT_AMBULATORY_CARE_PROVIDER_SITE_OTHER): Payer: Medicare Other | Admitting: Family Medicine

## 2012-01-10 VITALS — BP 142/84 | HR 85 | Temp 98.3°F | Ht 66.0 in | Wt 255.2 lb

## 2012-01-10 DIAGNOSIS — Z23 Encounter for immunization: Secondary | ICD-10-CM

## 2012-01-10 DIAGNOSIS — R609 Edema, unspecified: Secondary | ICD-10-CM

## 2012-01-10 LAB — BASIC METABOLIC PANEL
BUN: 21 mg/dL (ref 6–23)
CO2: 31 mEq/L (ref 19–32)
Calcium: 9.1 mg/dL (ref 8.4–10.5)
Chloride: 105 mEq/L (ref 96–112)
Creatinine, Ser: 1.1 mg/dL (ref 0.4–1.2)
GFR: 60.47 mL/min (ref >60.00)
Glucose, Bld: 96 mg/dL (ref 70–99)
Potassium: 3.5 mEq/L (ref 3.5–5.1)
Sodium: 141 mEq/L (ref 135–145)

## 2012-01-10 MED ORDER — FUROSEMIDE 40 MG PO TABS: 80.0000 mg | ORAL_TABLET | Freq: Every day | ORAL | Status: DC

## 2012-01-10 NOTE — Patient Instructions (Addendum)
For leg swelling avoid salty foods and processed foods  Drink more water Work on weight loss Walk a lot - but avoid prolonged sitting and standing  When you do sit elevate feet to the level of your heart or higher  When possible- wear stockings with support to help push the fluid out  Increase lasix to 2 pills once daily (for a total of 80 mg once daily)- take it in the am  Labs today  Schedule labs and follow up when you return from your trip

## 2012-01-10 NOTE — Assessment & Plan Note (Signed)
In obese pt with hx of HTN who has been sitting a lot and eating high salt / processed foods Also planning airplane flight Lab today- chem  See AVS - for adv RE: lifestyle change to help edema Inc lasix to 80 mg once daily - and f/u dec after trip for re check and lab Wt loss will be important

## 2012-01-10 NOTE — Progress Notes (Signed)
Subjective:    Patient ID: Monica [name], female    DOB: [birth year], 76 y.o.   MRN: [id number]  HPI Here for edema   Wt is up 3 lb with bmi of 41  Has eaten more salty and processed foods - at nursing home with her mother , and sitting a lot  Took aleve the other day for some shoulder pain    On lasix 40 mg daily  Wants to increase this for a while to get her swelling under control  Is aware this will increase urinary frequency - will take mind of that  No sob No cp or PND or orthopnea    Has to take beta blocker for HTN   Stress- her mother died a month ago - this will allow her, however some time to take care of herself  Is leaving next Monday to spend some time with her daughter - 10 days   Patient Active Problem List  Diagnosis  . HYPERLIPIDEMIA  . GOUT, UNSPECIFIED  . OBESITY  . ANXIETY  . DEPRESSION  . HYPERTENSION  . CARDIOMEGALY  . Allergic Rhinitis, Cause Unspecified  . SCIATICA  . SLEEP APNEA  . EDEMA  . OVEREATING  . OSTEOARTHRITIS, KNEES, BILATERAL  . HYPERGLYCEMIA  . Other screening mammogram  . Rash and nonspecific skin eruption  . Yeast vaginitis  . Urge incontinence of urine   Past Medical History  Diagnosis Date  . Allergic rhinitis   . Anxiety   . Depression   . Hyperlipidemia   . HTN (hypertension)   . Obesity   . Compulsive overeating   . OA (osteoarthritis) of knee     with injections  . DM2 (diabetes mellitus, type 2)   . Gout   . Cardiomegaly   . Edema   . Other abnormal glucose   . Sciatica   . Unspecified sleep apnea    Past Surgical History  Procedure Date  . Partial hysterectomy     fibroids  . Breast lumpectomy     right x1 ('89) left x2 ('70s, '90)  . Colonoscopy 11/02    diverticulosis  . US transvaginal pelvic modified 2000, 2001  . Dexa 3/02    normal  . Cardiac catheterization 2001    normal per pt  . Sleep study 5/05  . Colonoscopy 9/06    diverticulosis, hemorhoids  . Adenosine cardiolite 1/08   low risk   . Knee replacement 9/11    R. Dr. Priscille Kluver   History  Substance Use Topics  . Smoking status: Former Games developer  . Smokeless tobacco: Not on file     Comment: quit approx. 20 years ago  . Alcohol Use: Yes     Comment: wine occasional   Family History  Problem Relation Age of Onset  . Gout      whole family   . Stroke Father   . Hypertension Mother     (a lot of animosity in their relationship)  . Prostate cancer Brother   . Hepatitis Brother     Hep C; alcoholism (terminal)  . Breast cancer Sister   . Other Brother     drug addiction   Allergies  Allergen Reactions  . Buspirone Hcl     REACTION: made her sleepy, ? swollen legs  . Lisinopril     REACTION: dizziness  . Prozac (Fluoxetine Hcl)     sleepy   Current Outpatient Prescriptions on File Prior to Visit  Medication Sig Dispense Refill  .  allopurinol (ZYLOPRIM) 100 MG tablet Take 1 tablet (100 mg total) by mouth daily.  30 tablet  11  . ALPRAZolam (XANAX) 0.5 MG tablet Take 1 tablet (0.5 mg total) by mouth daily as needed for anxiety.  15 tablet  0  . calcium carbonate (OS-CAL) 600 MG TABS Take 600 mg by mouth daily.       . colchicine 0.6 MG tablet Take 0.6 mg by mouth 2 (two) times daily as needed. With food      . diphenhydrAMINE (BENADRYL) 25 MG tablet Take 25 mg by mouth every 6 (six) hours as needed.      . furosemide (LASIX) 40 MG tablet Take 1 tablet (40 mg total) by mouth daily.  30 tablet  11  . losartan (COZAAR) 50 MG tablet TAKE ONE (1) TABLET BY MOUTH EACH DAY  30 tablet  11  . magnesium oxide (MAG-OX) 400 MG tablet Take 400 mg by mouth daily. Over the counter       . metoprolol succinate (TOPROL-XL) 100 MG 24 hr tablet TAKE ONE (1) TABLET BY MOUTH EVERY      DAY  30 tablet  11  . NEVANAC 0.1 % ophthalmic suspension Place 2 drops into the left eye as needed.       . NON FORMULARY Accu check aviva strips and softclix lancets. Use to check blood sugar once daily and PRN for DM. Also Glucometer  -check glucose daily and PRN for DM       . potassium chloride SA (K-DUR,KLOR-CON) 20 MEQ tablet Take 1 tablet (20 mEq total) by mouth daily.  30 tablet  11  . aspirin 81 MG EC tablet Take 81 mg by mouth daily. HOLD            Review of Systems Review of Systems  Constitutional: Negative for fever, appetite change, fatigue and unexpected weight change.  Eyes: Negative for pain and visual disturbance.  Respiratory: Negative for cough and shortness of breath.  pos for edema Cardiovascular: Negative for cp or palpitations    Gastrointestinal: Negative for nausea, diarrhea and constipation.  Genitourinary: Negative for urgency and frequency.  Skin: Negative for pallor or rash   Neurological: Negative for weakness, light-headedness, numbness and headaches.  Hematological: Negative for adenopathy. Does not bruise/bleed easily.  Psychiatric/Behavioral: Negative for dysphoric mood. The patient is somewhat anxious.         Objective:   Physical Exam  Constitutional: She appears well-developed and well-nourished. No distress.  HENT:  Head: Normocephalic and atraumatic.  Mouth/Throat: Oropharynx is clear and moist.  Eyes: Conjunctivae normal and EOM are normal. Pupils are equal, round, and reactive to light. Right eye exhibits no discharge. Left eye exhibits no discharge. No scleral icterus.  Neck: Normal range of motion. Neck supple. No JVD present. Carotid bruit is not present. No thyromegaly present.  Cardiovascular: Normal rate, regular rhythm, normal heart sounds and intact distal pulses.  Exam reveals no gallop.   Pulmonary/Chest: Effort normal and breath sounds normal. No respiratory distress. She has no wheezes. She has no rales.       No crackles  Abdominal: Soft. Bowel sounds are normal. She exhibits no distension, no abdominal bruit and no mass. There is no tenderness.  Musculoskeletal: She exhibits edema. She exhibits no tenderness.       One plus pitting edema ankles and feet    Lymphadenopathy:    She has no cervical adenopathy.  Neurological: She is alert. She has normal reflexes.  Skin:  Skin is warm and dry. No rash noted. No erythema.  Psychiatric: She has a normal mood and affect.          Assessment & Plan:

## 2012-01-12 ENCOUNTER — Encounter: Payer: Self-pay | Admitting: *Deleted

## 2012-01-31 ENCOUNTER — Ambulatory Visit (INDEPENDENT_AMBULATORY_CARE_PROVIDER_SITE_OTHER): Payer: Medicare Other | Admitting: Obstetrics & Gynecology

## 2012-01-31 ENCOUNTER — Encounter: Payer: Self-pay | Admitting: Obstetrics & Gynecology

## 2012-01-31 VITALS — BP 176/87 | HR 73 | Ht 67.0 in | Wt 257.0 lb

## 2012-01-31 DIAGNOSIS — N949 Unspecified condition associated with female genital organs and menstrual cycle: Secondary | ICD-10-CM

## 2012-01-31 DIAGNOSIS — Z90710 Acquired absence of both cervix and uterus: Secondary | ICD-10-CM

## 2012-01-31 DIAGNOSIS — G8929 Other chronic pain: Secondary | ICD-10-CM

## 2012-01-31 DIAGNOSIS — N9089 Other specified noninflammatory disorders of vulva and perineum: Secondary | ICD-10-CM

## 2012-01-31 MED ORDER — CYCLOBENZAPRINE HCL 10 MG PO TABS: 10.0000 mg | ORAL_TABLET | Freq: Three times a day (TID) | ORAL | Status: DC | PRN

## 2012-01-31 NOTE — Progress Notes (Signed)
History:  76 y.o. PMP female here today for evaluation of RLQ pain and intermittent bloating. She had a TAH several years ago for fibroids; ovaries were left in place.  She is concerned she has ovarian cancer as one of her friends just got diagnosed in her 66s.  Sister has breast cancer, no other breast or ovarian cancer history.   Patient also reports reports urge incontinence which has been evaluated by a urologist.  Because of bladder leakage, she gets vulvar irritation and scratches the area often. She also wants this evaluated.  The following portions of the patient's history were reviewed and updated as appropriate: allergies, current medications, past family history, past medical history, past social history, past surgical history and problem list.  Review of Systems:  Pertinent items are noted in HPI.  Objective:  Physical Exam BP 176/87  Pulse 73  Ht 5\' 7"  (1.702 m)  Wt 257 lb (116.574 kg)  BMI 40.25 kg/m2 Gen: NAD Abd: Soft, obese, nontender and nondistended. Patient has a significant pannus hanging over her upper thighs. She points to a region on her right inguinal region as the point of tenderness, this is at the junction between her pannus and legs. She reports having candidal infections under her pannus in the past, no erythema, no lesions noted today. Pelvic: Excoriated, slightly erythematous outer vulva. No other lesions seen on vulva. Speculum exam deferred.  Assessment & Plan:  Pain is likely not of GYN etiology, likely musculoskeletal.  Recommended NSAIDs, also prescribed Flexeril prn muscle pain.  Pelvic ultrasound ordered to evaluate her ovaries. Recommended OTC hydrocortisone cream for her vulva, no active infection noted. She was told to call/come back if symptoms worsen. Other routine preventative health maintenance measures are managed by her PCP, Dr, Roxy Manns.

## 2012-01-31 NOTE — Patient Instructions (Signed)
Return to clinic for any scheduled appointments or for any gynecologic concerns as needed.   

## 2012-02-02 ENCOUNTER — Ambulatory Visit: Payer: Self-pay | Admitting: General Surgery

## 2012-02-07 ENCOUNTER — Ambulatory Visit (HOSPITAL_COMMUNITY)
Admission: RE | Admit: 2012-02-07 | Discharge: 2012-02-07 | Disposition: A | Discharge status: Home / Self Care | Payer: Medicare Other | Source: Ambulatory Visit | Attending: Obstetrics & Gynecology | Admitting: Obstetrics & Gynecology

## 2012-02-07 DIAGNOSIS — R143 Flatulence: Secondary | ICD-10-CM | POA: Insufficient documentation

## 2012-02-07 DIAGNOSIS — N949 Unspecified condition associated with female genital organs and menstrual cycle: Secondary | ICD-10-CM | POA: Insufficient documentation

## 2012-02-07 DIAGNOSIS — R142 Eructation: Secondary | ICD-10-CM | POA: Insufficient documentation

## 2012-02-07 DIAGNOSIS — R1031 Right lower quadrant pain: Secondary | ICD-10-CM | POA: Insufficient documentation

## 2012-02-07 DIAGNOSIS — Z90710 Acquired absence of both cervix and uterus: Secondary | ICD-10-CM | POA: Insufficient documentation

## 2012-02-07 DIAGNOSIS — G8929 Other chronic pain: Secondary | ICD-10-CM

## 2012-02-07 DIAGNOSIS — R141 Gas pain: Secondary | ICD-10-CM | POA: Insufficient documentation

## 2012-02-10 ENCOUNTER — Encounter: Payer: Self-pay | Admitting: Family Medicine

## 2012-02-10 ENCOUNTER — Ambulatory Visit (INDEPENDENT_AMBULATORY_CARE_PROVIDER_SITE_OTHER): Payer: Medicare Other | Admitting: Family Medicine

## 2012-02-10 VITALS — BP 156/88 | HR 86 | Temp 98.0°F | Ht 66.0 in | Wt 257.0 lb

## 2012-02-10 DIAGNOSIS — R609 Edema, unspecified: Secondary | ICD-10-CM

## 2012-02-10 DIAGNOSIS — I499 Cardiac arrhythmia, unspecified: Secondary | ICD-10-CM | POA: Insufficient documentation

## 2012-02-10 DIAGNOSIS — R0602 Shortness of breath: Secondary | ICD-10-CM

## 2012-02-10 MED ORDER — FUROSEMIDE 40 MG PO TABS: 40.0000 mg | ORAL_TABLET | Freq: Every day | ORAL | Status: DC

## 2012-02-10 NOTE — Progress Notes (Signed)
Subjective:    Patient ID: Trellis Moment, female    DOB: Mar 04, 1935, 76 y.o.   MRN: [id number]  HPI Here for f/u and for a visit for irreg heartbeat  2 days ago - saw Dr Evette Cristal after a mammogram and he detected an abnormal rhythm- told that her heartbeat was "skipping" Wanted to have it re checked  She does have an occ sensation in chest- but attributes that to indigestion  No palpitations EKG today shows signs of LVH and rate of 78  She had last cardiol visit - 08 for sob Still has sob on exertion Also feels like swelling is getting worse   bp is 156/88 Is somewhat nervous today   Knows she eats fast and does not chew enough Eats some raw veg A lot of onions  Still not loosing weight - but a little imp in her diet   Gyn- sees Dr upcoming for some pelvic pain-had an ultrasound     Chemistry      Component Value Date/Time   NA 141 01/10/2012 1332   K 3.5 01/10/2012 1332   CL 105 01/10/2012 1332   CO2 31 01/10/2012 1332   BUN 21 01/10/2012 1332   CREATININE 1.1 01/10/2012 1332      Component Value Date/Time   CALCIUM 9.1 01/10/2012 1332   ALKPHOS 98 12/10/2010 1440   AST 20 12/10/2010 1440   ALT 15 12/10/2010 1440   BILITOT 0.5 12/10/2010 1440      Went up on her lasix again -no improvement   Patient Active Problem List  Diagnosis  . HYPERLIPIDEMIA  . GOUT, UNSPECIFIED  . OBESITY  . ANXIETY  . DEPRESSION  . HYPERTENSION  . CARDIOMEGALY  . Allergic Rhinitis, Cause Unspecified  . SCIATICA  . SLEEP APNEA  . EDEMA  . OVEREATING  . OSTEOARTHRITIS, KNEES, BILATERAL  . HYPERGLYCEMIA  . Other screening mammogram  . Rash and nonspecific skin eruption  . Yeast vaginitis  . Urge incontinence of urine   Past Medical History  Diagnosis Date  . Allergic rhinitis   . Anxiety   . Depression   . Hyperlipidemia   . HTN (hypertension)   . Obesity   . Compulsive overeating   . OA (osteoarthritis) of knee     with injections  . DM2 (diabetes mellitus, type 2)    . Gout   . Cardiomegaly   . Edema   . Other abnormal glucose   . Sciatica   . Unspecified sleep apnea    Past Surgical History  Procedure Date  . Total abdominal hysterectomy     fibroids  . Breast lumpectomy     right x1 ('89) left x2 ('70s, '90)  . Colonoscopy 11/02    diverticulosis  . US transvaginal pelvic modified 2000, 2001  . Dexa 3/02    normal  . Cardiac catheterization 2001    normal per pt  . Sleep study 5/05  . Colonoscopy 9/06    diverticulosis, hemorhoids  . Adenosine cardiolite 1/08    low risk   . Knee replacement 9/11    R. Dr. Priscille Kluver   History  Substance Use Topics  . Smoking status: Former Games developer  . Smokeless tobacco: Not on file     Comment: quit approx. 20 years ago  . Alcohol Use: Yes     Comment: wine occasional   Family History  Problem Relation Age of Onset  . Gout      whole family   . Stroke  Father   . Hypertension Mother     (a lot of animosity in their relationship)  . Prostate cancer Brother   . Hepatitis Brother     Hep C; alcoholism (terminal)  . Breast cancer Sister   . Other Brother     drug addiction   Allergies  Allergen Reactions  . Buspirone Hcl     REACTION: made her sleepy, ? swollen legs  . Lisinopril     REACTION: dizziness  . Prozac (Fluoxetine Hcl)     sleepy   Current Outpatient Prescriptions on File Prior to Visit  Medication Sig Dispense Refill  . allopurinol (ZYLOPRIM) 100 MG tablet Take 1 tablet (100 mg total) by mouth daily.  30 tablet  11  . aspirin 81 MG EC tablet Take 81 mg by mouth daily. HOLD       . calcium carbonate (OS-CAL) 600 MG TABS Take 600 mg by mouth daily.       . colchicine 0.6 MG tablet Take 0.6 mg by mouth 2 (two) times daily as needed. With food      . diphenhydrAMINE (BENADRYL) 25 MG tablet Take 25 mg by mouth every 6 (six) hours as needed.      . furosemide (LASIX) 40 MG tablet Take 2 tablets (80 mg total) by mouth daily.  60 tablet  2  . losartan (COZAAR) 50 MG tablet TAKE  ONE (1) TABLET BY MOUTH EACH DAY  30 tablet  11  . magnesium oxide (MAG-OX) 400 MG tablet Take 400 mg by mouth daily. Over the counter       . metoprolol succinate (TOPROL-XL) 100 MG 24 hr tablet TAKE ONE (1) TABLET BY MOUTH EVERY      DAY  30 tablet  11  . NEVANAC 0.1 % ophthalmic suspension Place 2 drops into the left eye as needed.       . NON FORMULARY Accu check aviva strips and softclix lancets. Use to check blood sugar once daily and PRN for DM. Also Glucometer -check glucose daily and PRN for DM       . potassium chloride SA (K-DUR,KLOR-CON) 20 MEQ tablet Take 1 tablet (20 mEq total) by mouth daily.  30 tablet  11         Patient Active Problem List  Diagnosis  . HYPERLIPIDEMIA  . GOUT, UNSPECIFIED  . OBESITY  . ANXIETY  . DEPRESSION  . HYPERTENSION  . CARDIOMEGALY  . Allergic Rhinitis, Cause Unspecified  . SCIATICA  . SLEEP APNEA  . EDEMA  . OVEREATING  . OSTEOARTHRITIS, KNEES, BILATERAL  . HYPERGLYCEMIA  . Other screening mammogram  . Rash and nonspecific skin eruption  . Yeast vaginitis  . Urge incontinence of urine   Past Medical History  Diagnosis Date  . Allergic rhinitis   . Anxiety   . Depression   . Hyperlipidemia   . HTN (hypertension)   . Obesity   . Compulsive overeating   . OA (osteoarthritis) of knee     with injections  . DM2 (diabetes mellitus, type 2)   . Gout   . Cardiomegaly   . Edema   . Other abnormal glucose   . Sciatica   . Unspecified sleep apnea    Past Surgical History  Procedure Date  . Total abdominal hysterectomy     fibroids  . Breast lumpectomy     right x1 ('89) left x2 ('70s, '90)  . Colonoscopy 11/02    diverticulosis  . US transvaginal pelvic  modified 2000, 2001  . Dexa 3/02    normal  . Cardiac catheterization 2001    normal per pt  . Sleep study 5/05  . Colonoscopy 9/06    diverticulosis, hemorhoids  . Adenosine cardiolite 1/08    low risk   . Knee replacement 9/11    R. Dr. Priscille Kluver   History   Substance Use Topics  . Smoking status: Former Games developer  . Smokeless tobacco: Not on file     Comment: quit approx. 20 years ago  . Alcohol Use: Yes     Comment: wine occasional   Family History  Problem Relation Age of Onset  . Gout      whole family   . Stroke Father   . Hypertension Mother     (a lot of animosity in their relationship)  . Prostate cancer Brother   . Hepatitis Brother     Hep C; alcoholism (terminal)  . Breast cancer Sister   . Other Brother     drug addiction   Allergies  Allergen Reactions  . Buspirone Hcl     REACTION: made her sleepy, ? swollen legs  . Lisinopril     REACTION: dizziness  . Prozac (Fluoxetine Hcl)     sleepy   Current Outpatient Prescriptions on File Prior to Visit  Medication Sig Dispense Refill  . allopurinol (ZYLOPRIM) 100 MG tablet Take 1 tablet (100 mg total) by mouth daily.  30 tablet  11  . aspirin 81 MG EC tablet Take 81 mg by mouth daily. HOLD       . calcium carbonate (OS-CAL) 600 MG TABS Take 600 mg by mouth daily.       . colchicine 0.6 MG tablet Take 0.6 mg by mouth 2 (two) times daily as needed. With food      . diphenhydrAMINE (BENADRYL) 25 MG tablet Take 25 mg by mouth every 6 (six) hours as needed.      . furosemide (LASIX) 40 MG tablet Take 2 tablets (80 mg total) by mouth daily.  60 tablet  2  . losartan (COZAAR) 50 MG tablet TAKE ONE (1) TABLET BY MOUTH EACH DAY  30 tablet  11  . magnesium oxide (MAG-OX) 400 MG tablet Take 400 mg by mouth daily. Over the counter       . metoprolol succinate (TOPROL-XL) 100 MG 24 hr tablet TAKE ONE (1) TABLET BY MOUTH EVERY      DAY  30 tablet  11  . NEVANAC 0.1 % ophthalmic suspension Place 2 drops into the left eye as needed.       . NON FORMULARY Accu check aviva strips and softclix lancets. Use to check blood sugar once daily and PRN for DM. Also Glucometer -check glucose daily and PRN for DM       . potassium chloride SA (K-DUR,KLOR-CON) 20 MEQ tablet Take 1 tablet (20 mEq  total) by mouth daily.  30 tablet  11    Review of Systems Review of Systems  Constitutional: Negative for fever, appetite change,  and unexpected weight change. pos for fatigue Eyes: Negative for pain and visual disturbance.  Respiratory: Negative for cough and wheeze, pos for sob on exertion Cardiovascular: Negative for cp or palpitations   (she has not been able to feel an irregular heart beat), neg for PND / orthopnea and pos for pedal edema  Gastrointestinal: Negative for nausea, diarrhea and constipation.  Genitourinary: Negative for urgency and frequency.  Skin: Negative for pallor or  rash   Neurological: Negative for weakness, light-headedness, numbness and headaches.  Hematological: Negative for adenopathy. Does not bruise/bleed easily.  Psychiatric/Behavioral: Negative for dysphoric mood. The patient is always nervous/anxious.         Objective:   Physical Exam  Constitutional: She appears well-developed and well-nourished. No distress.       obese and well appearing   HENT:  Head: Normocephalic and atraumatic.  Mouth/Throat: Oropharynx is clear and moist.  Eyes: Conjunctivae normal and EOM are normal. Pupils are equal, round, and reactive to light. Right eye exhibits no discharge. Left eye exhibits no discharge. No scleral icterus.  Neck: Normal range of motion. Neck supple. No JVD present. No thyromegaly present.  Cardiovascular: Normal rate, regular rhythm, normal heart sounds and intact distal pulses.  Exam reveals no gallop.   Pulmonary/Chest: Effort normal and breath sounds normal. No respiratory distress. She has no wheezes. She exhibits no tenderness.  Abdominal: Soft. Bowel sounds are normal. She exhibits no distension and no mass. There is no tenderness.  Musculoskeletal: She exhibits edema. She exhibits no tenderness.       One plus pedal edema with pitting   Lymphadenopathy:    She has no cervical adenopathy.  Neurological: She is alert. She has normal reflexes.  No cranial nerve deficit. She exhibits normal muscle tone. Coordination normal.  Skin: Skin is warm and dry. No rash noted. No erythema. No pallor.  Psychiatric: Her speech is normal and behavior is normal. Her mood appears anxious. Her affect is not blunt and not labile.          Assessment & Plan:

## 2012-02-10 NOTE — Patient Instructions (Addendum)
We will do a referral to cardiology at check out  Go back to 40 mg lasix Watch sodium in food you eat closely and work on weight loss the best you can

## 2012-02-10 NOTE — Assessment & Plan Note (Signed)
This continues -no imp on inc lasix dose this time  Disc lifestyle change incl wt loss  Ref to cardiol to check in - for this and sob on exertion also  Of note - also had irreg hr in surg office-not present today

## 2012-02-10 NOTE — Assessment & Plan Note (Signed)
Ongoing  ? If wt rel but now edema worse also  EKG shows some signs of LVH Ref to cardiol for a visit  Is obese with risk factors

## 2012-02-10 NOTE — Assessment & Plan Note (Signed)
This was detected on exam at Dr Luan Moore office -but not present today  EKG-nl rhythm Ref to cardiol for edema and SOB

## 2012-02-13 ENCOUNTER — Telehealth: Payer: Self-pay | Admitting: Gynecology

## 2012-02-13 NOTE — Telephone Encounter (Signed)
Patient call regarding ultrasound result. Normal ultrasound result was given to patient. Per patient doing well without any symptoms and will call office back if she is having any problems.

## 2012-02-20 ENCOUNTER — Ambulatory Visit (INDEPENDENT_AMBULATORY_CARE_PROVIDER_SITE_OTHER): Payer: Medicare Other | Admitting: Family Medicine

## 2012-02-20 ENCOUNTER — Encounter: Payer: Self-pay | Admitting: Family Medicine

## 2012-02-20 VITALS — BP 124/76 | Ht 67.0 in | Wt 258.0 lb

## 2012-02-20 DIAGNOSIS — G8929 Other chronic pain: Secondary | ICD-10-CM

## 2012-02-20 DIAGNOSIS — R1031 Right lower quadrant pain: Secondary | ICD-10-CM

## 2012-02-20 NOTE — Patient Instructions (Signed)
Non-sedating anti-histamines include Allegra, Claritin, Zyrtec Upper Respiratory Infection, Adult An upper respiratory infection (URI) is also known as the common cold. It is often caused by a type of germ (virus). Colds are easily spread (contagious). You can pass it to others by kissing, coughing, sneezing, or drinking out of the same glass. Usually, you get better in 1 or 2 weeks.  HOME CARE   Only take medicine as told by your doctor.  Use a warm mist humidifier or breathe in steam from a hot shower.  Drink enough water and fluids to keep your pee (urine) clear or pale yellow.  Get plenty of rest.  Return to work when your temperature is back to normal or as told by your doctor. You may use a face mask and wash your hands to stop your cold from spreading. GET HELP RIGHT AWAY IF:   After the first few days, you feel you are getting worse.  You have questions about your medicine.  You have chills, shortness of breath, or brown or red spit (mucus).  You have yellow or brown snot (nasal discharge) or pain in the face, especially when you bend forward.  You have a fever, puffy (swollen) neck, pain when you swallow, or white spots in the back of your throat.  You have a bad headache, ear pain, sinus pain, or chest pain.  You have a high-pitched whistling sound when you breathe in and out (wheezing).  You have a lasting cough or cough up blood.  You have sore muscles or a stiff neck. MAKE SURE YOU:   Understand these instructions.  Will watch your condition.  Will get help right away if you are not doing well or get worse. Document Released: 07/27/2007 Document Revised: 05/02/2011 Document Reviewed: 06/14/2010 [facility] Patient Information 2013 [place], [place]. Abdominal Pain Abdominal pain can be caused by many things. Your caregiver decides the seriousness of your pain by an examination and possibly blood tests and X-rays. Many cases can be observed and treated at home. Most  abdominal pain is not caused by a disease and will probably improve without treatment. However, in many cases, more time must pass before a clear cause of the pain can be found. Before that point, it may not be known if you need more testing, or if hospitalization or surgery is needed. HOME CARE INSTRUCTIONS   Do not take laxatives unless directed by your caregiver.  Take pain medicine only as directed by your caregiver.  Only take over-the-counter or prescription medicines for pain, discomfort, or fever as directed by your caregiver.  Try a clear liquid diet (broth, tea, or water) for as long as directed by your caregiver. Slowly move to a bland diet as tolerated. SEEK IMMEDIATE MEDICAL CARE IF:   The pain does not go away.  You have a fever.  You keep throwing up (vomiting).  The pain is felt only in portions of the abdomen. Pain in the right side could possibly be appendicitis. In an adult, pain in the left lower portion of the abdomen could be colitis or diverticulitis.  You pass bloody or black tarry stools. MAKE SURE YOU:   Understand these instructions.  Will watch your condition.  Will get help right away if you are not doing well or get worse. Document Released: 11/17/2004 Document Revised: 05/02/2011 Document Reviewed: 09/26/2007 [facility] Patient Information 2013 [place], [place].

## 2012-02-23 ENCOUNTER — Encounter: Payer: Self-pay | Admitting: Family Medicine

## 2012-02-23 NOTE — Assessment & Plan Note (Signed)
No evidence of ovarian cancer.  Ultrasound results reviewed with pt.

## 2012-02-23 NOTE — Progress Notes (Signed)
  Subjective:    Patient ID: Monica [name], female    DOB: May 29, 1935, 77 y.o.   MRN: [id number]  HPI  Pt. Referred and seen by Dr. Macon Large for lower abdominal pain, pt. Concern for ovarian cancer.  Had h/o hysterectomy some years ago.  Dr. Macon Large did not believe her pain was gynecologic in nature but sent her for an ultrasound to be complete.  Review of Systems  Constitutional: Negative for fever and chills.  Genitourinary: Negative for dysuria, vaginal bleeding and vaginal discharge.       Objective:   Physical Exam  Vitals reviewed. Constitutional: She appears well-developed and well-nourished.  HENT:  Head: Normocephalic and atraumatic.  Cardiovascular: Normal rate.   Pulmonary/Chest: Effort normal.  Abdominal: Soft.          Assessment & Plan:

## 2012-03-09 ENCOUNTER — Ambulatory Visit (INDEPENDENT_AMBULATORY_CARE_PROVIDER_SITE_OTHER): Payer: Medicare Other | Admitting: Cardiovascular Disease

## 2012-03-09 ENCOUNTER — Encounter: Payer: Self-pay | Admitting: Cardiovascular Disease

## 2012-03-09 VITALS — BP 152/106 | HR 76 | Ht 67.0 in | Wt 257.2 lb

## 2012-03-09 DIAGNOSIS — I10 Essential (primary) hypertension: Secondary | ICD-10-CM

## 2012-03-09 DIAGNOSIS — R0602 Shortness of breath: Secondary | ICD-10-CM

## 2012-03-09 NOTE — Patient Instructions (Addendum)
Your physician has requested that you have an echocardiogram. Echocardiography is a painless test that uses sound waves to create images of your heart. It provides your doctor with information about the size and shape of your heart and how well your heart's chambers and valves are working. This procedure takes approximately one hour. There are no restrictions for this procedure.  Follow up as needed 

## 2012-03-13 ENCOUNTER — Encounter: Payer: Self-pay | Admitting: Cardiovascular Disease

## 2012-03-13 NOTE — Progress Notes (Signed)
Primary care physician: Dr. Milinda Antis.  HPI  This is a pleasant 77 year old female who was referred by Dr. Milinda Antis for evaluation of dyspnea and lower extremity edema. The patient has known history of hypertension, hyperlipidemia, obesity and tobacco use. She has no prior history of coronary artery disease. She underwent cardiac catheterization by Dr. Sharyn Lull in early 2000 and was told that there was no significant blockages. This cath report is not available. She was seen by Dr. Diona Browner in our group for evaluation of atypical chest pain and dyspnea in 2008. She underwent evaluation with an echocardiogram and a nuclear stress test. Both of them were overall unremarkable. During a recent visit with Dr. Evette Cristal she was told about skipping beats when he listened to her heart. The patient denies any palpitations or chest pain. She does have chronic exertional dyspnea and mild lower extremity edema. She denies any dizziness, syncope or presyncope.  Allergies  Allergen Reactions  . Buspirone Hcl     REACTION: made her sleepy, ? swollen legs  . Lisinopril     REACTION: dizziness  . Prozac (Fluoxetine Hcl)     sleepy     Current Outpatient Prescriptions on File Prior to Visit  Medication Sig Dispense Refill  . allopurinol (ZYLOPRIM) 100 MG tablet Take 1 tablet (100 mg total) by mouth daily.  30 tablet  11  . aspirin 81 MG EC tablet Take 81 mg by mouth daily. HOLD       . calcium carbonate (OS-CAL) 600 MG TABS Take 600 mg by mouth daily.       . colchicine 0.6 MG tablet Take 0.6 mg by mouth 2 (two) times daily as needed. With food      . cyclobenzaprine (FLEXERIL) 10 MG tablet Take 10 mg by mouth as needed.      . diphenhydrAMINE (BENADRYL) 25 MG tablet Take 25 mg by mouth every 6 (six) hours as needed.      . furosemide (LASIX) 40 MG tablet Take 1 tablet (40 mg total) by mouth daily.  30 tablet  11  . losartan (COZAAR) 50 MG tablet TAKE ONE (1) TABLET BY MOUTH EACH DAY  30 tablet  11  . magnesium  oxide (MAG-OX) 400 MG tablet Take 400 mg by mouth daily. Over the counter       . metoprolol succinate (TOPROL-XL) 100 MG 24 hr tablet TAKE ONE (1) TABLET BY MOUTH EVERY      DAY  30 tablet  11  . potassium chloride SA (K-DUR,KLOR-CON) 20 MEQ tablet Take 1 tablet (20 mEq total) by mouth daily.  30 tablet  11     Past Medical History  Diagnosis Date  . Allergic rhinitis   . Anxiety   . Depression   . Hyperlipidemia   . HTN (hypertension)   . Obesity   . Compulsive overeating   . OA (osteoarthritis) of knee     with injections  . Gout   . Cardiomegaly   . Edema   . Other abnormal glucose   . Sciatica   . Unspecified sleep apnea      Past Surgical History  Procedure Date  . Total abdominal hysterectomy     fibroids  . Breast lumpectomy     right x1 ('89) left x2 ('70s, '90)  . Colonoscopy 11/02    diverticulosis  . US transvaginal pelvic modified 2000, 2001  . Dexa 3/02    normal  . Sleep study 5/05  . Colonoscopy 9/06  diverticulosis, hemorhoids  . Adenosine cardiolite 1/08    low risk   . Knee replacement 9/11    R. Dr. Priscille Kluver  . Cardiac catheterization 2001    normal per pt     Family History  Problem Relation Age of Onset  . Gout      whole family   . Stroke Father   . Hypertension Mother     (a lot of animosity in their relationship)  . Heart failure Mother   . Prostate cancer Brother   . Hepatitis Brother     Hep C; alcoholism (terminal)  . Breast cancer Sister   . Other Brother     drug addiction     History   Social History  . Marital Status: Widowed    Spouse Name: N/A    Number of Children: 1  . Years of Education: N/A   Occupational History  . Retired school principal    Social History Main Topics  . Smoking status: Former Smoker -- 0.5 packs/day for 20 years    Types: Cigarettes  . Smokeless tobacco: Not on file     Comment: quit approx. 20 years ago  . Alcohol Use: Yes     Comment: wine occasional  . Drug Use: No  .  Sexually Active: No   Other Topics Concern  . Not on file   Social History Narrative   Widowed, 1 daughter. 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  HENT: Negative for hearing loss, nosebleeds, congestion, sore throat, facial swelling, drooling, trouble swallowing, neck pain, voice change, sinus pressure and tinnitus.  Eyes: Negative for photophobia, pain, discharge and visual disturbance.  Respiratory: Negative for apnea, cough, chest tightness and wheezing.  Cardiovascular: Negative for chest pain, palpitations. Gastrointestinal: Negative for nausea, vomiting, abdominal pain, diarrhea, constipation, blood in stool and abdominal distention.  Genitourinary: Negative for dysuria, urgency, frequency, hematuria and decreased urine volume.  Musculoskeletal: Negative for myalgias, back pain, joint swelling, arthralgias and gait problem.  Skin: Negative for color change, pallor, rash and wound.  Neurological: Negative for dizziness, tremors, seizures, syncope, speech difficulty, weakness, light-headedness, numbness and headaches.  Psychiatric/Behavioral: Negative for suicidal ideas, hallucinations, behavioral problems and agitation. The patient is not nervous/anxious.     PHYSICAL EXAM   BP 152/106  Pulse 76  Ht 5\' 7"  (1.702 m)  Wt 257 lb 4 oz (116.688 kg)  BMI 40.29 kg/m2 Constitutional: She is oriented to person, place, and time. She appears well-developed and well-nourished. No distress.  HENT: No nasal discharge.  Head: Normocephalic and atraumatic.  Eyes: Pupils are equal and round. Right eye exhibits no discharge. Left eye exhibits no discharge.  Neck: Normal range of motion. Neck supple. No JVD present. No thyromegaly present.  Cardiovascular: Normal rate, regular rhythm, normal heart sounds. Exam reveals no gallop and no friction rub. No murmur  heard.  Pulmonary/Chest: Effort normal and breath sounds normal. No stridor. No respiratory distress. She has no wheezes. She has no rales. She exhibits no tenderness.  Abdominal: Soft. Bowel sounds are normal. She exhibits no distension. There is no tenderness. There is no rebound and no guarding.  Musculoskeletal: Normal range of motion. She exhibits trace edema and no tenderness.  Neurological: She is alert and oriented to person, place, and time. Coordination normal.  Skin: Skin is warm and dry. No rash noted. She is not diaphoretic.  No erythema. No pallor.  Psychiatric: She has a normal mood and affect. Her behavior is normal. Judgment and thought content normal.     EKG: Sinus  Rhythm  -Nonspecific ST depression   +   Nonspecific T-abnormality  -Nondiagnostic.   ABNORMAL    ASSESSMENT AND PLAN

## 2012-03-13 NOTE — Assessment & Plan Note (Signed)
This is likely multifactorial due to physical deconditioning and possible diastolic heart failure related to prolonged history of hypertension. The patient denies any chest pain or tightness. I recommend further evaluation with an echocardiogram to evaluate LV systolic/diastolic function and make sure she does not have significant pulmonary hypertension. Given the lack of chest pain and previous negative workup for ischemic heart disease, I will not pursue a stress test at this time. The patient's EKG is showing normal sinus rhythm. It also showed normal sinus rhythm during her recent visit with Dr. Milinda Antis. She is not really having any symptoms of palpitations and thus the utility of Holter monitor is likely very low. This can be considered in the future if the patient complains of palpitations.

## 2012-03-19 ENCOUNTER — Telehealth: Payer: Self-pay | Admitting: Family Medicine

## 2012-03-19 DIAGNOSIS — R0602 Shortness of breath: Secondary | ICD-10-CM

## 2012-03-19 DIAGNOSIS — R609 Edema, unspecified: Secondary | ICD-10-CM

## 2012-03-19 NOTE — Telephone Encounter (Signed)
Patient called and asked if you would refer her to a different Cardiologist, she wants to see Dr Verdis Prime at [facility]. Please put referral in as they will need a Referral to give her an appt.

## 2012-03-19 NOTE — Telephone Encounter (Signed)
Ref done  

## 2012-03-20 ENCOUNTER — Other Ambulatory Visit: Payer: Self-pay | Admitting: Family Medicine

## 2012-03-30 ENCOUNTER — Other Ambulatory Visit: Payer: Self-pay | Admitting: Family Medicine

## 2012-04-09 ENCOUNTER — Telehealth: Payer: Self-pay

## 2012-04-09 NOTE — Telephone Encounter (Signed)
Monica [name] with [facility] left v/m requesting last OV and cardiac related info faxed to [phone]. 02/10/12 office note from Dr Milinda Antis and 03/09/12 office note Dr Kirke Corin with EKGs faxed as requested.

## 2012-04-09 NOTE — Telephone Encounter (Signed)
Monica [name] called and they didn't received fax (OV, labs, EKG) so refaxed info to [phone]

## 2012-04-10 ENCOUNTER — Other Ambulatory Visit: Payer: Medicare Other

## 2012-06-11 ENCOUNTER — Encounter: Payer: Self-pay | Admitting: Family Medicine

## 2012-06-11 ENCOUNTER — Ambulatory Visit (INDEPENDENT_AMBULATORY_CARE_PROVIDER_SITE_OTHER): Payer: Medicare Other | Admitting: Family Medicine

## 2012-06-11 ENCOUNTER — Ambulatory Visit (INDEPENDENT_AMBULATORY_CARE_PROVIDER_SITE_OTHER)
Admission: RE | Admit: 2012-06-11 | Discharge: 2012-06-11 | Disposition: A | Discharge status: Home / Self Care | Payer: Medicare Other | Source: Ambulatory Visit | Attending: Family Medicine | Admitting: Family Medicine

## 2012-06-11 VITALS — BP 142/90 | HR 70 | Temp 98.4°F | Ht 67.0 in | Wt 254.2 lb

## 2012-06-11 DIAGNOSIS — M25519 Pain in unspecified shoulder: Secondary | ICD-10-CM

## 2012-06-11 DIAGNOSIS — M25511 Pain in right shoulder: Secondary | ICD-10-CM | POA: Insufficient documentation

## 2012-06-11 DIAGNOSIS — R21 Rash and other nonspecific skin eruption: Secondary | ICD-10-CM

## 2012-06-11 DIAGNOSIS — M25512 Pain in left shoulder: Secondary | ICD-10-CM

## 2012-06-11 MED ORDER — MOMETASONE FUROATE 0.1 % EX CREA: TOPICAL_CREAM | Freq: Every day | CUTANEOUS | Status: DC

## 2012-06-11 NOTE — Assessment & Plan Note (Signed)
Today is on hands and arms- suspect this is a photosensitivity reaction from medication Disc sun protection For rash trial of elocon cream -Update if not starting to improve in a week or if worsening

## 2012-06-11 NOTE — Assessment & Plan Note (Signed)
Suspect bursitis but will check xray today Recommended ice/cold compresses and gentle rom exercises
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
## 2012-06-11 NOTE — Patient Instructions (Addendum)
Use some ice on your shoulder - 10 minutes at a time  Xray today- we will call you with results  For rash- use the elocon cream as needed and keep protected from the sun

## 2012-06-12 ENCOUNTER — Telehealth: Payer: Self-pay | Admitting: Family Medicine

## 2012-06-12 DIAGNOSIS — M25512 Pain in left shoulder: Secondary | ICD-10-CM

## 2012-06-12 NOTE — Telephone Encounter (Signed)
Message copied by Judy Pimple on Tue Jun 12, 2012 10:22 AM ------      Message from: Monica [name]      Created: Tue Jun 12, 2012  9:51 AM       Pt notified of xray results and agrees with referral, I advise her Shirlee Limerick will call back to set up appt ------

## 2012-08-06 ENCOUNTER — Other Ambulatory Visit: Payer: Self-pay | Admitting: Family Medicine

## 2012-08-17 ENCOUNTER — Encounter: Payer: Self-pay | Admitting: *Deleted

## 2012-09-26 ENCOUNTER — Other Ambulatory Visit: Payer: Self-pay

## 2012-10-01 ENCOUNTER — Other Ambulatory Visit: Payer: Self-pay | Admitting: Family Medicine

## 2012-11-15 ENCOUNTER — Other Ambulatory Visit: Payer: Self-pay | Admitting: Family Medicine

## 2012-11-16 NOTE — Telephone Encounter (Signed)
Electronic refill request, no recent/future appt., please advise  

## 2012-11-16 NOTE — Telephone Encounter (Signed)
I printed refill to fax -in IN box   (if it can be sent electronically that is fine)

## 2012-11-19 NOTE — Telephone Encounter (Signed)
Rx faxed

## 2012-11-27 ENCOUNTER — Ambulatory Visit (INDEPENDENT_AMBULATORY_CARE_PROVIDER_SITE_OTHER): Payer: Medicare Other | Admitting: Family Medicine

## 2012-11-27 ENCOUNTER — Encounter: Payer: Self-pay | Admitting: Family Medicine

## 2012-11-27 VITALS — BP 154/88 | HR 78 | Temp 97.9°F | Ht 67.0 in | Wt 263.5 lb

## 2012-11-27 DIAGNOSIS — Z23 Encounter for immunization: Secondary | ICD-10-CM

## 2012-11-27 DIAGNOSIS — I10 Essential (primary) hypertension: Secondary | ICD-10-CM

## 2012-11-27 DIAGNOSIS — R7309 Other abnormal glucose: Secondary | ICD-10-CM

## 2012-11-27 DIAGNOSIS — E785 Hyperlipidemia, unspecified: Secondary | ICD-10-CM

## 2012-11-27 DIAGNOSIS — L538 Other specified erythematous conditions: Secondary | ICD-10-CM

## 2012-11-27 DIAGNOSIS — L304 Erythema intertrigo: Secondary | ICD-10-CM

## 2012-11-27 MED ORDER — KETOCONAZOLE 2 % EX CREA: TOPICAL_CREAM | Freq: Every day | CUTANEOUS | Status: DC

## 2012-11-27 NOTE — Patient Instructions (Addendum)
Dry the rash area very very well (even use a hair dryer on the cool setting  Use the ketoconazole cream until clear  Let me know if no improvement  Start back on your losartan (stop it if you have side effects)  Do your best to work on lifestyle change  Follow up with me in 3 months with labs prior   Flu shot today

## 2012-11-27 NOTE — Progress Notes (Signed)
Subjective:    Patient ID: Monica [name], female    DOB: [birth year], 77 y.o.   MRN: [id number]  HPI Here with rash/ irritation in skin folds of groin/abdomen Thinks it is blistery/ red  Knows she does not dry well Some itching   She stopped losartan (thought it may have caused itching) In retrospect she does not think it caused problems     had a f/u 6 mo with Dr Katrinka Blazing (cardiol)- bp shot up and wt is up 9 lb  Added amlodipine for bp  194 systolic when she was there BP Readings from Last 3 Encounters:  11/27/12 154/88  06/11/12 142/90  03/09/12 152/106    Patient Active Problem List   Diagnosis Date Noted  . Intertrigo 11/27/2012  . Lump or mass in breast   . Left shoulder pain 06/11/2012  . Abdominal pain, chronic, right lower quadrant 02/23/2012  . Short of breath on exertion 02/10/2012  . Irregular heart beat 02/10/2012  . Yeast vaginitis 09/09/2011  . Urge incontinence of urine 09/09/2011  . Rash and nonspecific skin eruption 08/08/2011  . Other screening mammogram 12/15/2010  . OSTEOARTHRITIS, KNEES, BILATERAL 02/09/2010  . GOUT, UNSPECIFIED 03/20/2008  . HYPERGLYCEMIA 02/27/2008  . HYPERLIPIDEMIA 12/21/2006  . OBESITY 12/21/2006  . ANXIETY 12/21/2006  . DEPRESSION 12/21/2006  . HYPERTENSION 12/21/2006  . CARDIOMEGALY 12/21/2006  . Allergic Rhinitis, Cause Unspecified 12/21/2006  . SCIATICA 12/21/2006  . SLEEP APNEA 12/21/2006  . EDEMA 12/21/2006  . OVEREATING 12/21/2006   Past Medical History  Diagnosis Date  . Allergic rhinitis   . Anxiety   . Depression   . Hyperlipidemia   . HTN (hypertension)   . Obesity   . Compulsive overeating   . OA (osteoarthritis) of knee     with injections  . Gout   . Cardiomegaly   . Edema   . Other abnormal glucose   . Sciatica   . Unspecified sleep apnea   . Benign neoplasm of breast 2013  . Personal history of tobacco use, presenting hazards to health   . Diffuse cystic mastopathy 2013  . Sinus infection 2010   . Lump or mass in breast 2012  . Family history of malignant neoplasm of breast    Past Surgical History  Procedure Laterality Date  . Breast lumpectomy      right x1 ('89) left x2 ('70s, '90)  . Colonoscopy  11/02    diverticulosis  . US transvaginal pelvic modified  2000, 2001  . Dexa  3/02    normal  . Sleep study  5/05  . Colonoscopy  9/06    diverticulosis, hemorhoids  . Adenosine cardiolite  1/08    low risk   . Knee replacement Right 9/11    R. Dr. Priscille Kluver  . Cardiac catheterization  2001    normal per pt  . Total abdominal hysterectomy      fibroids, age of 72  . Appendectomy    . Tonsillectomy    . Eye surgery  2012    cataract  . Total knee arthroplasty Left 2012   History  Substance Use Topics  . Smoking status: Former Smoker -- 0.50 packs/day for 20 years    Types: Cigarettes  . Smokeless tobacco: Not on file     Comment: quit approx. 20 years ago  . Alcohol Use: Yes     Comment: wine occasional   Family History  Problem Relation Age of Onset  . Gout  whole family   . Stroke Father   . Hypertension Mother     (a lot of animosity in their relationship)  . Heart failure Mother   . Prostate cancer Brother   . Hepatitis Brother     Hep C; alcoholism (terminal)  . Breast cancer Sister   . Other Brother     drug addiction   Allergies  Allergen Reactions  . Buspirone Hcl     REACTION: made her sleepy, ? swollen legs  . Lisinopril     REACTION: dizziness  . Prozac [Fluoxetine Hcl]     sleepy   Current Outpatient Prescriptions on File Prior to Visit  Medication Sig Dispense Refill  . ACCU-CHEK AVIVA PLUS test strip CHECK GLUCOSE WITH EACH MEAL AND AT BEDTIME AS DIRECTED  100 each  3  . allopurinol (ZYLOPRIM) 100 MG tablet TAKE ONE (1) TABLET BY MOUTH EVERY DAY  30 tablet  3  . calcium carbonate (OS-CAL) 600 MG TABS Take 600 mg by mouth daily.       . colchicine 0.6 MG tablet Take 0.6 mg by mouth 2 (two) times daily as needed. With food       . cyclobenzaprine (FLEXERIL) 10 MG tablet Take 10 mg by mouth as needed.      . diphenhydrAMINE (BENADRYL) 25 MG tablet Take 25 mg by mouth every 6 (six) hours as needed.      . furosemide (LASIX) 40 MG tablet Take 1 tablet (40 mg total) by mouth daily.  30 tablet  11  . Magnesium 250 MG TABS Take by mouth daily.      . magnesium oxide (MAG-OX) 400 MG tablet Take 400 mg by mouth daily. Over the counter       . metoprolol succinate (TOPROL-XL) 100 MG 24 hr tablet TAKE ONE (1) TABLET BY MOUTH EVERY      DAY  30 tablet  11  . mometasone (ELOCON) 0.1 % cream Apply topically daily. Apply to affected area once daily to rash as needed  45 g  0  . potassium chloride SA (K-DUR,KLOR-CON) 20 MEQ tablet TAKE ONE (1) TABLET BY MOUTH EACH DAY  30 tablet  3  . spironolactone (ALDACTONE) 25 MG tablet Take 1 tablet by mouth daily.       No current facility-administered medications on file prior to visit.    Review of Systems     Objective:   Physical Exam  Constitutional: She appears well-developed and well-nourished. No distress.  Morbidly obese and well app  HENT:  Head: Normocephalic and atraumatic.  Eyes: Conjunctivae and EOM are normal. Pupils are equal, round, and reactive to light. No scleral icterus.  Neck: Normal range of motion. Neck supple. No JVD present. Carotid bruit is not present. No thyromegaly present.  Cardiovascular: Normal rate, regular rhythm, normal heart sounds and intact distal pulses.  Exam reveals no gallop.   Pulmonary/Chest: Effort normal and breath sounds normal. No respiratory distress. She has no wheezes.  Abdominal: She exhibits no distension and no abdominal bruit.  Musculoskeletal: She exhibits edema.  Mild pedal edema   Lymphadenopathy:    She has no cervical adenopathy.  Neurological: She is alert. She has normal reflexes. No cranial nerve deficit. She exhibits normal muscle tone. Coordination normal.  Skin: Skin is warm and dry. Rash noted. There is erythema.   Erythema in groin skin folds on R with satellite lesions No oozing or excoriation Less so on L side   Psychiatric: She has a  normal mood and affect.          Assessment & Plan:

## 2012-11-28 NOTE — Assessment & Plan Note (Signed)
bp is high  Adv to return to losartan because it did not cause rash after all  F/u with cardiol planned F/u here planned with lab prior Enc wt loss

## 2012-11-28 NOTE — Assessment & Plan Note (Signed)
Will tx with ketoconazole inst to keep area very dry Also wt loss would help

## 2012-12-06 ENCOUNTER — Other Ambulatory Visit: Payer: Self-pay | Admitting: Family Medicine

## 2012-12-12 ENCOUNTER — Encounter: Payer: Self-pay | Admitting: Interventional Cardiology

## 2012-12-12 ENCOUNTER — Ambulatory Visit (INDEPENDENT_AMBULATORY_CARE_PROVIDER_SITE_OTHER): Payer: Medicare Other | Admitting: Interventional Cardiology

## 2012-12-12 VITALS — BP 140/80 | HR 76 | Wt 264.0 lb

## 2012-12-12 DIAGNOSIS — E785 Hyperlipidemia, unspecified: Secondary | ICD-10-CM

## 2012-12-12 DIAGNOSIS — I10 Essential (primary) hypertension: Secondary | ICD-10-CM

## 2012-12-12 DIAGNOSIS — I5032 Chronic diastolic (congestive) heart failure: Secondary | ICD-10-CM | POA: Insufficient documentation

## 2012-12-12 MED ORDER — AMLODIPINE BESYLATE 5 MG PO TABS: 5.0000 mg | ORAL_TABLET | Freq: Every day | ORAL | Status: DC

## 2012-12-12 NOTE — Progress Notes (Signed)
Patient ID: Monica [name], female   DOB: November 19, 1935, 77 y.o.   MRN: [id number]    [address] [place], [place]  [address] Phone: [phone] Fax:  [phone]  Date:  12/12/2012   ID:  Monica [name], DOB 02-11-1936, MRN [id number]  PCP:  Roxy Manns, MD   ASSESSMENT:  1. Severe blood pressure elevation, improved but with increased lower extremity edema since starting amlodipine 10 mg per day. Blood pressure is markedly improved compared to prior results.  2. Chronic diastolic heart failure, stable  3. Obesity  PLAN:  1. Decrease amlodipine to 5 mg per day due to lower extremity swelling  2. Salt and fluid restriction  3. Clinical followup in 3 months to recheck blood pressure and is still not controlled, we will need to increase her angiotensin receptor blocker, losartan   SUBJECTIVE: Monica [name] is a 77 y.o. female is accompanied by her daughter. She has done well with reinstitution of medical therapy except lower extremity swelling has gotten worse on amlodipine 10 mg per day. She still has exertional dyspnea. We spent time discussing the potential cause for the dyspnea. She denies chest pain. She continues to gain weight.   Wt Readings from Last 3 Encounters:  12/12/12 264 lb (119.75 kg)  11/27/12 263 lb 8 oz (119.523 kg)  06/11/12 254 lb 4 oz (115.327 kg)     Past Medical History  Diagnosis Date  . Allergic rhinitis   . Anxiety   . Depression   . Hyperlipidemia   . HTN (hypertension)   . Obesity   . Compulsive overeating   . OA (osteoarthritis) of knee     with injections  . Gout   . Cardiomegaly   . Edema   . Other abnormal glucose   . Sciatica   . Unspecified sleep apnea   . Benign neoplasm of breast 2013  . Personal history of tobacco use, presenting hazards to health   . Diffuse cystic mastopathy 2013  . Sinus infection 2010  . Lump or mass in breast 2012  . Family history of malignant neoplasm of breast     Current  Outpatient Prescriptions  Medication Sig Dispense Refill  . ACCU-CHEK AVIVA PLUS test strip CHECK GLUCOSE WITH EACH MEAL AND AT BEDTIME AS DIRECTED  100 each  3  . allopurinol (ZYLOPRIM) 100 MG tablet TAKE 1 TABLET BY MOUTH DAILY  30 tablet  2  . amLODipine (NORVASC) 10 MG tablet Take 1 tablet by mouth daily.      . calcium carbonate (OS-CAL) 600 MG TABS Take 600 mg by mouth daily.       . colchicine 0.6 MG tablet Take 0.6 mg by mouth 2 (two) times daily as needed. With food      . cyclobenzaprine (FLEXERIL) 10 MG tablet Take 10 mg by mouth as needed.      . diphenhydrAMINE (BENADRYL) 25 MG tablet Take 25 mg by mouth every 6 (six) hours as needed.      . furosemide (LASIX) 40 MG tablet Take 1 tablet (40 mg total) by mouth daily.  30 tablet  11  . losartan (COZAAR) 50 MG tablet Take 50 mg by mouth daily.      . magnesium oxide (MAG-OX) 400 MG tablet Take 400 mg by mouth daily. Over the counter       . metoprolol succinate (TOPROL-XL) 100 MG 24 hr tablet TAKE ONE (1) TABLET BY MOUTH EVERY      DAY  30 tablet  11  . mometasone (ELOCON) 0.1 % cream Apply topically daily. Apply to affected area once daily to rash as needed  45 g  0  . potassium chloride SA (K-DUR,KLOR-CON) 20 MEQ tablet TAKE ONE (1) TABLET BY MOUTH EACH DAY  30 tablet  3  . spironolactone (ALDACTONE) 25 MG tablet Take 1 tablet by mouth daily.      . Magnesium 250 MG TABS Take by mouth daily.       No current facility-administered medications for this visit.    Allergies:    Allergies  Allergen Reactions  . Buspirone Hcl     REACTION: made her sleepy, ? swollen legs  . Lisinopril     REACTION: dizziness  . Prozac [Fluoxetine Hcl]     sleepy    Social History:  The patient  reports that she has quit smoking. Her smoking use included Cigarettes. She has a 10 pack-year smoking history. She does not have any smokeless tobacco history on file. She reports that she drinks alcohol. She reports that she does not use illicit drugs.     ROS:  Please see the history of present illness.   No orthopnea, PND, or chest pain.   All other systems reviewed and negative.   OBJECTIVE: VS:  BP 140/80  Pulse 76  Wt 264 lb (119.75 kg)  BMI 41.34 kg/m2 Well nourished, well developed, in no acute distress, obese HEENT: normal Neck: JVD flat. Carotid bruit absent  Cardiac:  normal S1, S2; RRR; no murmur Lungs:  clear to auscultation bilaterally, no wheezing, rhonchi or rales Abd: soft, nontender, no hepatomegaly Ext: Edema 3+ ankle. Pulses 2+ bilateral Skin: warm and dry Neuro:  CNs 2-12 intact, no focal abnormalities noted  EKG:  Not performed       Signed, Darci Needle III, MD 12/12/2012 2:10 PM

## 2012-12-12 NOTE — Patient Instructions (Signed)
Decrease Amlodopine to 5mg  daily.An Rx has been sent to your pharmacy  Monitor your sodium and fluid intake  Your physician recommends that you schedule a follow-up appointment in: 3 months

## 2013-01-02 ENCOUNTER — Other Ambulatory Visit: Payer: Self-pay | Admitting: Family Medicine

## 2013-01-16 ENCOUNTER — Other Ambulatory Visit: Payer: Self-pay | Admitting: Family Medicine

## 2013-02-01 ENCOUNTER — Other Ambulatory Visit: Payer: Self-pay | Admitting: Family Medicine

## 2013-02-06 ENCOUNTER — Ambulatory Visit: Payer: Self-pay | Admitting: General Surgery

## 2013-02-07 ENCOUNTER — Encounter: Payer: Self-pay | Admitting: General Surgery

## 2013-02-11 ENCOUNTER — Encounter: Payer: Self-pay | Admitting: General Surgery

## 2013-02-11 ENCOUNTER — Ambulatory Visit (INDEPENDENT_AMBULATORY_CARE_PROVIDER_SITE_OTHER): Payer: Medicare Other | Admitting: General Surgery

## 2013-02-11 VITALS — BP 140/80 | HR 76 | Resp 16 | Ht 66.25 in | Wt 250.0 lb

## 2013-02-11 DIAGNOSIS — N6019 Diffuse cystic mastopathy of unspecified breast: Secondary | ICD-10-CM

## 2013-02-11 NOTE — Progress Notes (Signed)
Patient ID: Monica [name], female   DOB: 1936-01-02, 77 y.o.   MRN: [id number]  Chief Complaint  Patient presents with  . Follow-up    1 year follow up screening mammogram     HPI Monica [name] is a 77 y.o. female who presents for a breast evaluation. The most recent mammogram was done on 02/06/13. Patient does not perform regular self breast checks but does get regular mammograms done. She denies any problems with her breasts at this time.     HPI  Past Medical History  Diagnosis Date  . Allergic rhinitis   . Anxiety   . Depression   . Hyperlipidemia   . HTN (hypertension)   . Obesity   . Compulsive overeating   . OA (osteoarthritis) of knee     with injections  . Gout   . Cardiomegaly   . Edema   . Other abnormal glucose   . Sciatica   . Unspecified sleep apnea   . Benign neoplasm of breast 2013  . Personal history of tobacco use, presenting hazards to health   . Diffuse cystic mastopathy 2013  . Sinus infection 2010  . Lump or mass in breast 2012  . Family history of malignant neoplasm of breast     Past Surgical History  Procedure Laterality Date  . Breast lumpectomy      right x1 ('89) left x2 ('70s, '90)  . Colonoscopy  11/02    diverticulosis  . US transvaginal pelvic modified  2000, 2001  . Dexa  3/02    normal  . Sleep study  5/05  . Colonoscopy  9/06    diverticulosis, hemorhoids  . Adenosine cardiolite  1/08    low risk   . Knee replacement Right 9/11    R. Dr. Priscille Kluver  . Cardiac catheterization  2001    normal per pt  . Total abdominal hysterectomy      fibroids, age of 21  . Appendectomy    . Tonsillectomy    . Eye surgery  2012    cataract  . Total knee arthroplasty Left 2012    Family History  Problem Relation Age of Onset  . Gout      whole family   . Stroke Father   . Hypertension Mother     (a lot of animosity in their relationship)  . Heart failure Mother   . Prostate cancer Brother   . Hepatitis Brother     Hep C;  alcoholism (terminal)  . Breast cancer Sister   . Other Brother     drug addiction    Social History History  Substance Use Topics  . Smoking status: Former Smoker -- 0.50 packs/day for 20 years    Types: Cigarettes  . Smokeless tobacco: Not on file     Comment: quit approx. 20 years ago  . Alcohol Use: Yes     Comment: wine occasional    Allergies  Allergen Reactions  . Buspirone Hcl     REACTION: made her sleepy, ? swollen legs  . Lisinopril     REACTION: dizziness  . Prozac [Fluoxetine Hcl]     sleepy    Current Outpatient Prescriptions  Medication Sig Dispense Refill  . allopurinol (ZYLOPRIM) 100 MG tablet TAKE 1 TABLET BY MOUTH DAILY  30 tablet  2  . calcium carbonate (OS-CAL) 600 MG TABS Take 600 mg by mouth daily.       . furosemide (LASIX) 40 MG tablet Take 1 tablet (40  mg total) by mouth daily.  30 tablet  11  . losartan (COZAAR) 50 MG tablet Take 50 mg by mouth daily.      . magnesium oxide (MAG-OX) 400 MG tablet Take 400 mg by mouth daily. Over the counter       . metoprolol succinate (TOPROL-XL) 100 MG 24 hr tablet TAKE ONE (1) TABLET BY MOUTH EVERY DAY  30 tablet  5  . potassium chloride SA (K-DUR,KLOR-CON) 20 MEQ tablet TAKE 1 TABLET BY MOUTH DAILY  30 tablet  1  . spironolactone (ALDACTONE) 25 MG tablet Take 1 tablet by mouth daily.       No current facility-administered medications for this visit.    Review of Systems Review of Systems  Constitutional: Negative.   Respiratory: Negative.   Cardiovascular: Negative.     Blood pressure 140/80, pulse 76, resp. rate 16, height 5' 6.25" (1.683 m), weight 250 lb (113.399 kg).  Physical Exam Physical Exam  Constitutional: She is oriented to person, place, and time. She appears well-developed and well-nourished.  Eyes: Conjunctivae are normal. No scleral icterus.  Neck: No thyromegaly present.  Cardiovascular: Normal rate, regular rhythm and normal heart sounds.   No murmur heard. Pulmonary/Chest:  Effort normal and breath sounds normal. Right breast exhibits no inverted nipple, no mass, no nipple discharge, no skin change and no tenderness. Left breast exhibits no inverted nipple, no mass, no nipple discharge, no skin change and no tenderness.  Inferior aspect of both breasts with benign appearing keratotic lesions.   Lymphadenopathy:    She has no cervical adenopathy.    She has no axillary adenopathy.  Neurological: She is alert and oriented to person, place, and time.  Skin: Skin is warm and dry.    Data Reviewed  Mammogram reviewed. There is calcifications felt to be likely in the skin of the outer right breast. Additional views have been recommended.   Assessment    Exam stable.     Plan    Patient to be arranged for additional views of right breast. If okay will see her back in 1 year.        Monica,SEEPLAPUTHUR [name] 02/12/2013, 5:51 AM

## 2013-02-11 NOTE — Patient Instructions (Signed)
Patient to be arranged for additional views of right breast. If okay will see her back in 1 year.

## 2013-02-12 ENCOUNTER — Encounter: Payer: Self-pay | Admitting: General Surgery

## 2013-02-18 ENCOUNTER — Telehealth: Payer: Self-pay | Admitting: *Deleted

## 2013-02-18 ENCOUNTER — Ambulatory Visit: Payer: Self-pay | Admitting: General Surgery

## 2013-02-18 NOTE — Telephone Encounter (Signed)
Message for patient to call the office.  Per Dr. Evette [name], patient's mammogram today came back okay. She is in the recalls for one year and we will send her a letter closer to time with those appointments.

## 2013-02-19 ENCOUNTER — Telehealth: Payer: Self-pay | Admitting: *Deleted

## 2013-02-19 NOTE — Telephone Encounter (Signed)
Patient called the office back and was notified as instructed. She verbalizes understanding.  

## 2013-02-25 ENCOUNTER — Other Ambulatory Visit (INDEPENDENT_AMBULATORY_CARE_PROVIDER_SITE_OTHER): Payer: Medicare Other

## 2013-02-25 ENCOUNTER — Encounter: Payer: Self-pay | Admitting: General Surgery

## 2013-02-25 DIAGNOSIS — R7309 Other abnormal glucose: Secondary | ICD-10-CM

## 2013-02-25 DIAGNOSIS — E785 Hyperlipidemia, unspecified: Secondary | ICD-10-CM

## 2013-02-25 DIAGNOSIS — I10 Essential (primary) hypertension: Secondary | ICD-10-CM

## 2013-02-25 LAB — CBC WITH DIFFERENTIAL/PLATELET
Basophils Absolute: 0 10*3/uL (ref 0.0–0.1)
Basophils Relative: 0.5 % (ref 0.0–3.0)
Eosinophils Absolute: 0.1 10*3/uL (ref 0.0–0.7)
Eosinophils Relative: 2.2 % (ref 0.0–5.0)
HCT: 40 % (ref 36.0–46.0)
Hemoglobin: 13.2 g/dL (ref 12.0–15.0)
Lymphocytes Relative: 33 % (ref 12.0–46.0)
Lymphs Abs: 2.1 10*3/uL (ref 0.7–4.0)
MCHC: 33 g/dL (ref 30.0–36.0)
MCV: 84.7 fl (ref 78.0–100.0)
Monocytes Absolute: 0.5 10*3/uL (ref 0.1–1.0)
Monocytes Relative: 8 % (ref 3.0–12.0)
Neutro Abs: 3.5 10*3/uL (ref 1.4–7.7)
Neutrophils Relative %: 56.3 % (ref 43.0–77.0)
Platelets: 243 10*3/uL (ref 150.0–400.0)
RBC: 4.73 Mil/uL (ref 3.87–5.11)
RDW: 15.6 % — ABNORMAL HIGH (ref 11.5–14.6)
WBC: 6.3 10*3/uL (ref 4.5–10.5)

## 2013-02-25 LAB — COMPREHENSIVE METABOLIC PANEL
ALT: 22 U/L (ref 0–35)
AST: 22 U/L (ref 0–37)
Albumin: 4.1 g/dL (ref 3.5–5.2)
Alkaline Phosphatase: 80 U/L (ref 39–117)
BUN: 15 mg/dL (ref 6–23)
CO2: 28 mEq/L (ref 19–32)
Calcium: 9.3 mg/dL (ref 8.4–10.5)
Chloride: 106 mEq/L (ref 96–112)
Creatinine, Ser: 0.9 mg/dL (ref 0.4–1.2)
GFR: 82.62 mL/min (ref >60.00)
Glucose, Bld: 104 mg/dL — ABNORMAL HIGH (ref 70–99)
Potassium: 4 mEq/L (ref 3.5–5.1)
Sodium: 141 mEq/L (ref 135–145)
Total Bilirubin: 0.5 mg/dL (ref 0.3–1.2)
Total Protein: 7.3 g/dL (ref 6.0–8.3)

## 2013-02-25 LAB — LIPID PANEL
Cholesterol: 200 mg/dL (ref 0–200)
HDL: 39.9 mg/dL (ref >39.00)
LDL Cholesterol: 138 mg/dL — ABNORMAL HIGH (ref 0–99)
Total CHOL/HDL Ratio: 5
Triglycerides: 112 mg/dL (ref 0.0–149.0)
VLDL: 22.4 mg/dL (ref 0.0–40.0)

## 2013-02-25 LAB — TSH: TSH: 1.91 u[IU]/mL (ref 0.35–5.50)

## 2013-02-25 LAB — HEMOGLOBIN A1C: Hgb A1c MFr Bld: 6.1 % (ref 4.6–6.5)

## 2013-02-27 ENCOUNTER — Encounter: Payer: Self-pay | Admitting: Family Medicine

## 2013-02-27 ENCOUNTER — Ambulatory Visit (INDEPENDENT_AMBULATORY_CARE_PROVIDER_SITE_OTHER): Payer: Medicare Other | Admitting: Family Medicine

## 2013-02-27 VITALS — BP 116/76 | HR 72 | Temp 98.4°F | Ht 66.25 in | Wt 251.5 lb

## 2013-02-27 DIAGNOSIS — E669 Obesity, unspecified: Secondary | ICD-10-CM

## 2013-02-27 DIAGNOSIS — I872 Venous insufficiency (chronic) (peripheral): Secondary | ICD-10-CM

## 2013-02-27 DIAGNOSIS — E785 Hyperlipidemia, unspecified: Secondary | ICD-10-CM

## 2013-02-27 DIAGNOSIS — I8310 Varicose veins of unspecified lower extremity with inflammation: Secondary | ICD-10-CM

## 2013-02-27 DIAGNOSIS — R7309 Other abnormal glucose: Secondary | ICD-10-CM

## 2013-02-27 DIAGNOSIS — I10 Essential (primary) hypertension: Secondary | ICD-10-CM

## 2013-02-27 MED ORDER — ALLOPURINOL 100 MG PO TABS: ORAL_TABLET | ORAL | Status: DC

## 2013-02-27 MED ORDER — POTASSIUM CHLORIDE CRYS ER 20 MEQ PO TBCR: 20.0000 meq | EXTENDED_RELEASE_TABLET | Freq: Every day | ORAL | Status: DC

## 2013-02-27 NOTE — Patient Instructions (Signed)
Keep working hard on diet and exercise  Focus on the videos for exercise (chair exercise)  Wear support hose for the rash on legs - you can also try otc cortisone cream  Cholesterol is up - so keep watching diet  Sugar is fairly stable  Follow up in 3 months with labs prior    Fat and Cholesterol Control Diet Fat and cholesterol levels in your blood and organs are influenced by your diet. High levels of fat and cholesterol may lead to diseases of the heart, small and large blood vessels, gallbladder, liver, and pancreas. CONTROLLING FAT AND CHOLESTEROL WITH DIET Although exercise and lifestyle factors are important, your diet is key. That is because certain foods are known to raise cholesterol and others to lower it. The goal is to balance foods for their effect on cholesterol and more importantly, to replace saturated and trans fat with other types of fat, such as monounsaturated fat, polyunsaturated fat, and omega-3 fatty acids. On average, a person should consume no more than 15 to 17 g of saturated fat daily. Saturated and trans fats are considered "bad" fats, and they will raise LDL cholesterol. Saturated fats are primarily found in animal products such as meats, butter, and cream. However, that does not mean you need to give up all your favorite foods. Today, there are good tasting, low-fat, low-cholesterol substitutes for most of the things you like to eat. Choose low-fat or nonfat alternatives. Choose round or loin cuts of red meat. These types of cuts are lowest in fat and cholesterol. Chicken (without the skin), fish, veal, and ground [place] breast are great choices. Eliminate fatty meats, such as hot dogs and salami. Even shellfish have little or no saturated fat. Have a 3 oz (85 g) portion when you eat lean meat, poultry, or fish. Trans fats are also called "partially hydrogenated oils." They are oils that have been scientifically manipulated so that they are solid at room temperature  resulting in a longer shelf life and improved taste and texture of foods in which they are added. Trans fats are found in stick margarine, some tub margarines, cookies, crackers, and baked goods.  When baking and cooking, oils are a great substitute for butter. The monounsaturated oils are especially beneficial since it is believed they lower LDL and raise HDL. The oils you should avoid entirely are saturated tropical oils, such as coconut and palm.  Remember to eat a lot from food groups that are naturally free of saturated and trans fat, including fish, fruit, vegetables, beans, grains (barley, rice, couscous, bulgur wheat), and pasta (without cream sauces).  IDENTIFYING FOODS THAT LOWER FAT AND CHOLESTEROL  Soluble fiber may lower your cholesterol. This type of fiber is found in fruits such as apples, vegetables such as broccoli, potatoes, and carrots, legumes such as beans, peas, and lentils, and grains such as barley. Foods fortified with plant sterols (phytosterol) may also lower cholesterol. You should eat at least 2 g per day of these foods for a cholesterol lowering effect.  Read package labels to identify low-saturated fats, trans fat free, and low-fat foods at the supermarket. Select cheeses that have only 2 to 3 g saturated fat per ounce. Use a heart-healthy tub margarine that is free of trans fats or partially hydrogenated oil. When buying baked goods (cookies, crackers), avoid partially hydrogenated oils. Breads and muffins should be made from whole grains (whole-wheat or whole oat flour, instead of "flour" or "enriched flour"). Buy non-creamy canned soups with reduced salt  and no added fats.  FOOD PREPARATION TECHNIQUES  Never deep-fry. If you must fry, either stir-fry, which uses very little fat, or use non-stick cooking sprays. When possible, broil, bake, or roast meats, and steam vegetables. Instead of putting butter or margarine on vegetables, use lemon and herbs, applesauce, and cinnamon  (for squash and sweet potatoes). Use nonfat yogurt, salsa, and low-fat dressings for salads.  LOW-SATURATED FAT / LOW-FAT FOOD SUBSTITUTES Meats / Saturated Fat (g)  Avoid: Steak, marbled (3 oz/85 g) / 11 g  Choose: Steak, lean (3 oz/85 g) / 4 g  Avoid: Hamburger (3 oz/85 g) / 7 g  Choose: Hamburger, lean (3 oz/85 g) / 5 g  Avoid: Ham (3 oz/85 g) / 6 g  Choose: Ham, lean cut (3 oz/85 g) / 2.4 g  Avoid: Chicken, with skin, dark meat (3 oz/85 g) / 4 g  Choose: Chicken, skin removed, dark meat (3 oz/85 g) / 2 g  Avoid: Chicken, with skin, light meat (3 oz/85 g) / 2.5 g  Choose: Chicken, skin removed, light meat (3 oz/85 g) / 1 g Dairy / Saturated Fat (g)  Avoid: Whole milk (1 cup) / 5 g  Choose: Low-fat milk, 2% (1 cup) / 3 g  Choose: Low-fat milk, 1% (1 cup) / 1.5 g  Choose: Skim milk (1 cup) / 0.3 g  Avoid: Hard cheese (1 oz/28 g) / 6 g  Choose: Skim milk cheese (1 oz/28 g) / 2 to 3 g  Avoid: Cottage cheese, 4% fat (1 cup) / 6.5 g  Choose: Low-fat cottage cheese, 1% fat (1 cup) / 1.5 g  Avoid: Ice cream (1 cup) / 9 g  Choose: Sherbet (1 cup) / 2.5 g  Choose: Nonfat frozen yogurt (1 cup) / 0.3 g  Choose: Frozen fruit bar / trace  Avoid: Whipped cream (1 tbs) / 3.5 g  Choose: Nondairy whipped topping (1 tbs) / 1 g Condiments / Saturated Fat (g)  Avoid: Mayonnaise (1 tbs) / 2 g  Choose: Low-fat mayonnaise (1 tbs) / 1 g  Avoid: Butter (1 tbs) / 7 g  Choose: Extra light margarine (1 tbs) / 1 g  Avoid: Coconut oil (1 tbs) / 11.8 g  Choose: Olive oil (1 tbs) / 1.8 g  Choose: Corn oil (1 tbs) / 1.7 g  Choose: Safflower oil (1 tbs) / 1.2 g  Choose: Sunflower oil (1 tbs) / 1.4 g  Choose: Soybean oil (1 tbs) / 2.4 g  Choose: Canola oil (1 tbs) / 1 g Document Released: 02/07/2005 Document Revised: 06/04/2012 Document Reviewed: 07/29/2010 [employer] Patient Information 2014 [place], [place].

## 2013-02-27 NOTE — Progress Notes (Signed)
Pre-visit discussion using our clinic review tool. No additional management support is needed unless otherwise documented below in the visit note.  

## 2013-02-27 NOTE — Progress Notes (Signed)
Subjective:    Patient ID: Trellis Moment, female    DOB: 1935/03/29, 78 y.o.   MRN: [id number]  HPI Here for f/u of HTN and chronic medical problems  Wt is up 1 lb with bmi of 40  bp is improved today No cp or palpitations or headaches or edema  No side effects to medicines  BP Readings from Last 3 Encounters:  02/27/13 116/76  02/11/13 140/80  12/12/12 140/80    She has worked hard on diet - getting rid of fast foods and sodium  Lots of problems with edema - now her cardiologist cut norvasc to 5 and she takes both lasix and aldactone -- with stable chemistries      Chemistry      Component Value Date/Time   NA 141 02/25/2013 1130   K 4.0 02/25/2013 1130   CL 106 02/25/2013 1130   CO2 28 02/25/2013 1130   BUN 15 02/25/2013 1130   CREATININE 0.9 02/25/2013 1130      Component Value Date/Time   CALCIUM 9.3 02/25/2013 1130   ALKPHOS 80 02/25/2013 1130   AST 22 02/25/2013 1130   ALT 22 02/25/2013 1130   BILITOT 0.5 02/25/2013 1130       Hyperglycemia A1C is up to 6.1 from 5.8- stays in this range usually She went to DM ed class -and the sweet spot class also - found that helpful   Hyperlipidemia - diet controlled Lab Results  Component Value Date   CHOL 200 02/25/2013   CHOL 196 12/10/2010   CHOL 200 12/09/2009   Lab Results  Component Value Date   HDL 39.90 02/25/2013   HDL 45.30 12/10/2010   HDL 39.20 12/09/2009   Lab Results  Component Value Date   LDLCALC 138* 02/25/2013   LDLCALC 122* 12/10/2010   LDLCALC 136* 06/02/2008   Lab Results  Component Value Date   TRIG 112.0 02/25/2013   TRIG 143.0 12/10/2010   TRIG 202.0* 12/09/2009   Lab Results  Component Value Date   CHOLHDL 5 02/25/2013   CHOLHDL 4 12/10/2010   CHOLHDL 5 12/09/2009   Lab Results  Component Value Date   LDLDIRECT 131.8 12/09/2009   LDLDIRECT 135.4 06/16/2009   LDLDIRECT 137.4 12/10/2008   she is not getting exercise at all - blames that on her knees  Just stopped fast food -will expect improvement    Lab Results  Component Value Date   TSH 1.91 02/25/2013        Patient Active Problem List   Diagnosis Date Noted  . Diffuse cystic mastopathy 02/11/2013  . Chronic diastolic heart failure 12/12/2012    Class: Chronic  . Intertrigo 11/27/2012  . Lump or mass in breast   . Left shoulder pain 06/11/2012  . Abdominal pain, chronic, right lower quadrant 02/23/2012  . Short of breath on exertion 02/10/2012  . Irregular heart beat 02/10/2012  . Yeast vaginitis 09/09/2011  . Urge incontinence of urine 09/09/2011  . Rash and nonspecific skin eruption 08/08/2011  . Other screening mammogram 12/15/2010  . OSTEOARTHRITIS, KNEES, BILATERAL 02/09/2010  . GOUT, UNSPECIFIED 03/20/2008  . HYPERGLYCEMIA 02/27/2008  . HYPERLIPIDEMIA 12/21/2006  . OBESITY 12/21/2006  . ANXIETY 12/21/2006  . DEPRESSION 12/21/2006  . HYPERTENSION 12/21/2006  . CARDIOMEGALY 12/21/2006  . Allergic Rhinitis, Cause Unspecified 12/21/2006  . SCIATICA 12/21/2006  . SLEEP APNEA 12/21/2006  . EDEMA 12/21/2006  . OVEREATING 12/21/2006   Past Medical History  Diagnosis Date  . Allergic rhinitis   .  Anxiety   . Depression   . Hyperlipidemia   . HTN (hypertension)   . Obesity   . Compulsive overeating   . OA (osteoarthritis) of knee     with injections  . Gout   . Cardiomegaly   . Edema   . Other abnormal glucose   . Sciatica   . Unspecified sleep apnea   . Benign neoplasm of breast 2013  . Personal history of tobacco use, presenting hazards to health   . Diffuse cystic mastopathy 2013  . Sinus infection 2010  . Lump or mass in breast 2012  . Family history of malignant neoplasm of breast    Past Surgical History  Procedure Laterality Date  . Breast lumpectomy      right x1 ('89) left x2 ('70s, '90)  . Colonoscopy  11/02    diverticulosis  . US transvaginal pelvic modified  2000, 2001  . Dexa  3/02    normal  . Sleep study  5/05  . Colonoscopy  9/06    diverticulosis, hemorhoids  .  Adenosine cardiolite  1/08    low risk   . Knee replacement Right 9/11    R. Dr. Telford Nab  . Cardiac catheterization  2001    normal per pt  . Total abdominal hysterectomy      fibroids, age of 31  . Appendectomy    . Tonsillectomy    . Eye surgery  2012    cataract  . Total knee arthroplasty Left 2012   History  Substance Use Topics  . Smoking status: Former Smoker -- 0.50 packs/day for 20 years    Types: Cigarettes  . Smokeless tobacco: Not on file     Comment: quit approx. 20 years ago  . Alcohol Use: Yes     Comment: wine occasional   Family History  Problem Relation Age of Onset  . Gout      whole family   . Stroke Father   . Hypertension Mother     (a lot of animosity in their relationship)  . Heart failure Mother   . Prostate cancer Brother   . Hepatitis Brother     Hep C; alcoholism (terminal)  . Breast cancer Sister   . Other Brother     drug addiction   Allergies  Allergen Reactions  . Buspirone Hcl     REACTION: made her sleepy, ? swollen legs  . Lisinopril     REACTION: dizziness  . Prozac [Fluoxetine Hcl]     sleepy   Current Outpatient Prescriptions on File Prior to Visit  Medication Sig Dispense Refill  . allopurinol (ZYLOPRIM) 100 MG tablet TAKE 1 TABLET BY MOUTH DAILY  30 tablet  2  . calcium carbonate (OS-CAL) 600 MG TABS Take 600 mg by mouth daily.       . furosemide (LASIX) 40 MG tablet Take 1 tablet (40 mg total) by mouth daily.  30 tablet  11  . losartan (COZAAR) 50 MG tablet Take 50 mg by mouth daily.      . magnesium oxide (MAG-OX) 400 MG tablet Take 400 mg by mouth daily. Over the counter       . metoprolol succinate (TOPROL-XL) 100 MG 24 hr tablet TAKE ONE (1) TABLET BY MOUTH EVERY DAY  30 tablet  5  . potassium chloride SA (K-DUR,KLOR-CON) 20 MEQ tablet TAKE 1 TABLET BY MOUTH DAILY  30 tablet  1  . spironolactone (ALDACTONE) 25 MG tablet Take 1 tablet by mouth daily.  No current facility-administered medications on file prior to  visit.     Patient Active Problem List   Diagnosis Date Noted  . Diffuse cystic mastopathy 02/11/2013  . Chronic diastolic heart failure 93/71/6967    Class: Chronic  . Intertrigo 11/27/2012  . Lump or mass in breast   . Left shoulder pain 06/11/2012  . Abdominal pain, chronic, right lower quadrant 02/23/2012  . Short of breath on exertion 02/10/2012  . Irregular heart beat 02/10/2012  . Yeast vaginitis 09/09/2011  . Urge incontinence of urine 09/09/2011  . Rash and nonspecific skin eruption 08/08/2011  . Other screening mammogram 12/15/2010  . OSTEOARTHRITIS, KNEES, BILATERAL 02/09/2010  . GOUT, UNSPECIFIED 03/20/2008  . HYPERGLYCEMIA 02/27/2008  . HYPERLIPIDEMIA 12/21/2006  . OBESITY 12/21/2006  . ANXIETY 12/21/2006  . DEPRESSION 12/21/2006  . HYPERTENSION 12/21/2006  . CARDIOMEGALY 12/21/2006  . Allergic Rhinitis, Cause Unspecified 12/21/2006  . SCIATICA 12/21/2006  . SLEEP APNEA 12/21/2006  . EDEMA 12/21/2006  . OVEREATING 12/21/2006   Past Medical History  Diagnosis Date  . Allergic rhinitis   . Anxiety   . Depression   . Hyperlipidemia   . HTN (hypertension)   . Obesity   . Compulsive overeating   . OA (osteoarthritis) of knee     with injections  . Gout   . Cardiomegaly   . Edema   . Other abnormal glucose   . Sciatica   . Unspecified sleep apnea   . Benign neoplasm of breast 2013  . Personal history of tobacco use, presenting hazards to health   . Diffuse cystic mastopathy 2013  . Sinus infection 2010  . Lump or mass in breast 2012  . Family history of malignant neoplasm of breast    Past Surgical History  Procedure Laterality Date  . Breast lumpectomy      right x1 ('89) left x2 ('70s, '90)  . Colonoscopy  11/02    diverticulosis  . US transvaginal pelvic modified  2000, 2001  . Dexa  3/02    normal  . Sleep study  5/05  . Colonoscopy  9/06    diverticulosis, hemorhoids  . Adenosine cardiolite  1/08    low risk   . Knee replacement Right  9/11    R. Dr. Telford Nab  . Cardiac catheterization  2001    normal per pt  . Total abdominal hysterectomy      fibroids, age of 30  . Appendectomy    . Tonsillectomy    . Eye surgery  2012    cataract  . Total knee arthroplasty Left 2012   History  Substance Use Topics  . Smoking status: Former Smoker -- 0.50 packs/day for 20 years    Types: Cigarettes  . Smokeless tobacco: Not on file     Comment: quit approx. 20 years ago  . Alcohol Use: Yes     Comment: wine occasional   Family History  Problem Relation Age of Onset  . Gout      whole family   . Stroke Father   . Hypertension Mother     (a lot of animosity in their relationship)  . Heart failure Mother   . Prostate cancer Brother   . Hepatitis Brother     Hep C; alcoholism (terminal)  . Breast cancer Sister   . Other Brother     drug addiction   Allergies  Allergen Reactions  . Buspirone Hcl     REACTION: made her sleepy, ? swollen legs  .  Lisinopril     REACTION: dizziness  . Prozac [Fluoxetine Hcl]     sleepy   Current Outpatient Prescriptions on File Prior to Visit  Medication Sig Dispense Refill  . allopurinol (ZYLOPRIM) 100 MG tablet TAKE 1 TABLET BY MOUTH DAILY  30 tablet  2  . calcium carbonate (OS-CAL) 600 MG TABS Take 600 mg by mouth daily.       . furosemide (LASIX) 40 MG tablet Take 1 tablet (40 mg total) by mouth daily.  30 tablet  11  . losartan (COZAAR) 50 MG tablet Take 50 mg by mouth daily.      . magnesium oxide (MAG-OX) 400 MG tablet Take 400 mg by mouth daily. Over the counter       . metoprolol succinate (TOPROL-XL) 100 MG 24 hr tablet TAKE ONE (1) TABLET BY MOUTH EVERY DAY  30 tablet  5  . potassium chloride SA (K-DUR,KLOR-CON) 20 MEQ tablet TAKE 1 TABLET BY MOUTH DAILY  30 tablet  1  . spironolactone (ALDACTONE) 25 MG tablet Take 1 tablet by mouth daily.       No current facility-administered medications on file prior to visit.    Review of Systems Review of Systems  Constitutional:  Negative for fever, appetite change, and unexpected weight change. pos for fatigue  Eyes: Negative for pain and visual disturbance.  Respiratory: Negative for cough and shortness of breath.   Cardiovascular: Negative for cp or palpitations   pos for pedal edema that has improved  Gastrointestinal: Negative for nausea, diarrhea and constipation.  Genitourinary: Negative for urgency and frequency.  Skin: Negative for pallor and pos for rash on legs    Neurological: Negative for weakness, light-headedness, numbness and headaches.  Hematological: Negative for adenopathy. Does not bruise/bleed easily.  Psychiatric/Behavioral: Negative for dysphoric mood. The patient is not nervous/anxious.  pos for lack of motivation re: health issues with emotional eating        Objective:   Physical Exam  Constitutional: She appears well-developed and well-nourished. No distress.  obese and well appearing   HENT:  Head: Normocephalic and atraumatic.  Mouth/Throat: Oropharynx is clear and moist.  Eyes: Conjunctivae and EOM are normal. Pupils are equal, round, and reactive to light. No scleral icterus.  Neck: Normal range of motion. Neck supple. No JVD present. Carotid bruit is not present. No thyromegaly present.  Cardiovascular: Normal rate, regular rhythm, normal heart sounds and intact distal pulses.  Exam reveals no gallop.   Pulmonary/Chest: Effort normal and breath sounds normal. No respiratory distress. She has no wheezes. She exhibits no tenderness.  Abdominal: Soft. Bowel sounds are normal. She exhibits no distension, no abdominal bruit and no mass. There is no tenderness.  Musculoskeletal: Normal range of motion. She exhibits edema. She exhibits no tenderness.  One plus pedal edema   Lymphadenopathy:    She has no cervical adenopathy.  Neurological: She is alert. She has normal reflexes. No cranial nerve deficit. She exhibits normal muscle tone. Coordination normal.  Skin: Skin is warm and dry.  Rash noted. No erythema. No pallor.  Some areas of erythema and scale on lower legs in swollen areas Few varicosities noted   Psychiatric: She has a normal mood and affect.          Assessment & Plan:

## 2013-02-28 ENCOUNTER — Telehealth: Payer: Self-pay | Admitting: Family Medicine

## 2013-02-28 NOTE — Assessment & Plan Note (Signed)
BP: 116/76 mmHg  Improved bp in fair control at this time  No changes needed Disc lifstyle change with low sodium diet and exercise   No change in med  Lab reviewed

## 2013-02-28 NOTE — Assessment & Plan Note (Signed)
Disc goals for lipids and reasons to control them Rev labs with pt Rev low sat fat diet in detail   

## 2013-02-28 NOTE — Assessment & Plan Note (Signed)
Recommend trying her supp hose again  Also otc cortisone cream as needed  Update if not starting to improve in several weeks  or if worsening

## 2013-02-28 NOTE — Assessment & Plan Note (Signed)
Lab Results  Component Value Date   HGBA1C 6.1 02/25/2013   Overall stable  Rev low glycemic diet Wt loss again greatly enc Disc doing chair exercise videos

## 2013-02-28 NOTE — Assessment & Plan Note (Signed)
Discussed how this problem influences overall health and the risks it imposes  Reviewed plan for weight loss with lower calorie diet (via better food choices and also portion control or program like weight watchers) and exercise building up to or more than 30 minutes 5 days per week including some aerobic activity   Pt lacks motivation- but did try some troubleshooting today Enc chair exercise programs

## 2013-02-28 NOTE — Telephone Encounter (Signed)
Relevant patient education assigned to patient using Emmi. ° °

## 2013-03-05 ENCOUNTER — Other Ambulatory Visit: Payer: Self-pay | Admitting: Family Medicine

## 2013-03-20 ENCOUNTER — Other Ambulatory Visit: Payer: Self-pay | Admitting: Family Medicine

## 2013-04-23 ENCOUNTER — Ambulatory Visit (INDEPENDENT_AMBULATORY_CARE_PROVIDER_SITE_OTHER): Payer: Medicare Other | Admitting: Interventional Cardiology

## 2013-04-23 ENCOUNTER — Encounter: Payer: Self-pay | Admitting: Interventional Cardiology

## 2013-04-23 VITALS — BP 148/98 | HR 68 | Ht 66.25 in | Wt 252.0 lb

## 2013-04-23 DIAGNOSIS — E785 Hyperlipidemia, unspecified: Secondary | ICD-10-CM

## 2013-04-23 DIAGNOSIS — I5032 Chronic diastolic (congestive) heart failure: Secondary | ICD-10-CM

## 2013-04-23 DIAGNOSIS — I10 Essential (primary) hypertension: Secondary | ICD-10-CM

## 2013-04-23 NOTE — Progress Notes (Signed)
Patient ID: Monica [name], female   DOB: April 21, 1935, 78 y.o.   MRN: [id number]    [address] [place], [place]  [address] Phone: [phone] Fax:  [phone]  Date:  04/23/2013   ID:  Monica [name], DOB [birth year], MRN [id number]  PCP:  Loura Pardon, MD   ASSESSMENT:  1. Hypertension, borderline but improved control 2. Diastolic heart failure, with symptoms of exertional dyspnea improved 3. Obesity  PLAN:  1. Continue current medical regimen. Continue salt restriction. 2. Clinical followup in 6 months.   SUBJECTIVE: Monica [name] is a 78 y.o. female who has significant hypertension and diastolic dysfunction. She had been limited by exertional dyspnea. This has improved. She denies medication side effects. There is no lower extremity swelling. She has not had syncope or chest pain.   Wt Readings from Last 3 Encounters:  04/23/13 252 lb (114.306 kg)  02/27/13 251 lb 8 oz (114.08 kg)  02/11/13 250 lb (113.399 kg)     Past Medical History  Diagnosis Date  . Allergic rhinitis   . Anxiety   . Depression   . Hyperlipidemia   . HTN (hypertension)   . Obesity   . Compulsive overeating   . OA (osteoarthritis) of knee     with injections  . Gout   . Cardiomegaly   . Edema   . Other abnormal glucose   . Sciatica   . Unspecified sleep apnea   . Benign neoplasm of breast 2013  . Personal history of tobacco use, presenting hazards to health   . Diffuse cystic mastopathy 2013  . Sinus infection 2010  . Lump or mass in breast 2012  . Family history of malignant neoplasm of breast     Current Outpatient Prescriptions  Medication Sig Dispense Refill  . allopurinol (ZYLOPRIM) 100 MG tablet TAKE 1 TABLET BY MOUTH DAILY  30 tablet  11  . amLODipine (NORVASC) 5 MG tablet Take 5 mg by mouth daily.      . calcium carbonate (OS-CAL) 600 MG TABS Take 600 mg by mouth daily.       . furosemide (LASIX) 40 MG tablet TAKE ONE (1) TABLET BY MOUTH EVERY DAY  30 tablet   3  . losartan (COZAAR) 50 MG tablet TAKE 1 TABLET BY MOUTH DAILY  30 tablet  5  . magnesium oxide (MAG-OX) 400 MG tablet Take 400 mg by mouth daily. Over the counter       . metoprolol succinate (TOPROL-XL) 100 MG 24 hr tablet TAKE ONE (1) TABLET BY MOUTH EVERY DAY  30 tablet  5  . potassium chloride SA (K-DUR,KLOR-CON) 20 MEQ tablet Take 1 tablet (20 mEq total) by mouth daily.  30 tablet  11  . spironolactone (ALDACTONE) 25 MG tablet Take 1 tablet by mouth daily.       No current facility-administered medications for this visit.    Allergies:    Allergies  Allergen Reactions  . Buspirone Hcl     REACTION: made her sleepy, ? swollen legs  . Lisinopril     REACTION: dizziness  . Prozac [Fluoxetine Hcl]     sleepy    Social History:  The patient  reports that she has quit smoking. Her smoking use included Cigarettes. She has a 10 pack-year smoking history. She does not have any smokeless tobacco history on file. She reports that she drinks alcohol. She reports that she does not use illicit drugs.   ROS:  Please see the history  of present illness.      All other systems reviewed and negative.   OBJECTIVE: VS:  BP 148/98  Pulse 68  Ht 5' 6.25" (1.683 m)  Wt 252 lb (114.306 kg)  BMI 40.36 kg/m2 Well nourished, well developed, in no acute distress, obese HEENT: normal Neck: JVD flat. Carotid bruit absent  Cardiac:  normal S1, S2; RRR; no murmur Lungs:  clear to auscultation bilaterally, no wheezing, rhonchi or rales Abd: soft, nontender, no hepatomegaly Ext: Edema no edema. Pulses 2+ and symmetric Skin: warm and dry Neuro:  CNs 2-12 intact, no focal abnormalities noted  EKG:  Normal sinus rhythm with nonspecific T-wave abnormality       Signed, Illene Labrador III, MD 04/23/2013 2:36 PM

## 2013-04-23 NOTE — Patient Instructions (Signed)
Your physician recommends that you continue on your current medications as directed. Please refer to the Current Medication list given to you today.  Your physician wants you to follow-up in: 6 months You will receive a reminder letter in the mail two months in advance. If you don't receive a letter, please call our office to schedule the follow-up appointment.   2 Gram Low Sodium Diet A 2 gram sodium diet restricts the amount of sodium in the diet to no more than 2 g or 2000 mg daily. Limiting the amount of sodium is often used to help lower blood pressure. It is important if you have heart, liver, or kidney problems. Many foods contain sodium for flavor and sometimes as a preservative. When the amount of sodium in a diet needs to be low, it is important to know what to look for when choosing foods and drinks. The following includes some information and guidelines to help make it easier for you to adapt to a low sodium diet. QUICK TIPS  Do not add salt to food.  Avoid convenience items and fast food.  Choose unsalted snack foods.  Buy lower sodium products, often labeled as "lower sodium" or "no salt added."  Check food labels to learn how much sodium is in 1 serving.  When eating at a restaurant, ask that your food be prepared with less salt or none, if possible. READING FOOD LABELS FOR SODIUM INFORMATION The nutrition facts label is a good place to find how much sodium is in foods. Look for products with no more than 500 to 600 mg of sodium per meal and no more than 150 mg per serving. Remember that 2 g = 2000 mg. The food label may also list foods as:  Sodium-free: Less than 5 mg in a serving.  Very low sodium: 35 mg or less in a serving.  Low-sodium: 140 mg or less in a serving.  Light in sodium: 50% less sodium in a serving. For example, if a food that usually has 300 mg of sodium is changed to become light in sodium, it will have 150 mg of sodium.  Reduced sodium: 25% less  sodium in a serving. For example, if a food that usually has 400 mg of sodium is changed to reduced sodium, it will have 300 mg of sodium. CHOOSING FOODS Grains  Avoid: Salted crackers and snack items. Some cereals, including instant hot cereals. Bread stuffing and biscuit mixes. Seasoned rice or pasta mixes.  Choose: Unsalted snack items. Low-sodium cereals, oats, puffed wheat and rice, shredded wheat. English muffins and bread. Pasta. Meats  Avoid: Salted, canned, smoked, spiced, pickled meats, including fish and poultry. Bacon, ham, sausage, cold cuts, hot dogs, anchovies.  Choose: Low-sodium canned tuna and salmon. Fresh or frozen meat, poultry, and fish. Dairy  Avoid: Processed cheese and spreads. Cottage cheese. Buttermilk and condensed milk. Regular cheese.  Choose: Milk. Low-sodium cottage cheese. Yogurt. Sour cream. Low-sodium cheese. Fruits and Vegetables  Avoid: Regular canned vegetables. Regular canned tomato sauce and paste. Frozen vegetables in sauces. Olives. Monica [name]. Relishes. Sauerkraut.  Choose: Low-sodium canned vegetables. Low-sodium tomato sauce and paste. Frozen or fresh vegetables. Fresh and frozen fruit. Condiments  Avoid: Canned and packaged gravies. Worcestershire sauce. Tartar sauce. Barbecue sauce. Soy sauce. Steak sauce. Ketchup. Onion, garlic, and table salt. Meat flavorings and tenderizers.  Choose: Fresh and dried herbs and spices. Low-sodium varieties of mustard and ketchup. Lemon juice. Tabasco sauce. Horseradish. SAMPLE 2 GRAM SODIUM MEAL PLAN Breakfast / Sodium (  mg)  1 cup low-fat milk / [id number] mg  2 slices whole-wheat toast / 270 mg  1 tbs heart-healthy margarine / 153 mg  1 hard-boiled egg / 139 mg  1 small orange / 0 mg Lunch / Sodium (mg)  1 cup raw carrots / 76 mg   cup hummus / 298 mg  1 cup low-fat milk / 143 mg   cup red grapes / 2 mg  1 whole-wheat pita bread / 356 mg Dinner / Sodium (mg)  1 cup whole-wheat pasta / 2  mg  1 cup low-sodium tomato sauce / 73 mg  3 oz lean ground beef / 57 mg  1 small side salad (1 cup raw spinach leaves,  cup cucumber,  cup yellow bell pepper) with 1 tsp olive oil and 1 tsp red wine vinegar / 25 mg Snack / Sodium (mg)  1 container low-fat vanilla yogurt / 107 mg  3 graham cracker squares / 127 mg Nutrient Analysis  Calories: 2033  Protein: 77 g  Carbohydrate: 282 g  Fat: 72 g  Sodium: 1971 mg Document Released: 02/07/2005 Document Revised: 05/02/2011 Document Reviewed: 05/11/2009 [employer] Patient Information 2014 [place], [place].

## 2013-05-22 ENCOUNTER — Other Ambulatory Visit (INDEPENDENT_AMBULATORY_CARE_PROVIDER_SITE_OTHER): Payer: Medicare Other

## 2013-05-22 DIAGNOSIS — I10 Essential (primary) hypertension: Secondary | ICD-10-CM

## 2013-05-22 DIAGNOSIS — R7309 Other abnormal glucose: Secondary | ICD-10-CM

## 2013-05-22 LAB — COMPREHENSIVE METABOLIC PANEL
ALT: 21 U/L (ref 0–35)
AST: 21 U/L (ref 0–37)
Albumin: 4 g/dL (ref 3.5–5.2)
Alkaline Phosphatase: 87 U/L (ref 39–117)
BUN: 19 mg/dL (ref 6–23)
CO2: 28 mEq/L (ref 19–32)
Calcium: 9.4 mg/dL (ref 8.4–10.5)
Chloride: 105 mEq/L (ref 96–112)
Creatinine, Ser: 1 mg/dL (ref 0.4–1.2)
GFR: 72.72 mL/min (ref >60.00)
Glucose, Bld: 89 mg/dL (ref 70–99)
Potassium: 3.9 mEq/L (ref 3.5–5.1)
Sodium: 140 mEq/L (ref 135–145)
Total Bilirubin: 0.5 mg/dL (ref 0.3–1.2)
Total Protein: 6.8 g/dL (ref 6.0–8.3)

## 2013-05-22 LAB — HEMOGLOBIN A1C: Hgb A1c MFr Bld: 6.1 % (ref 4.6–6.5)

## 2013-05-29 ENCOUNTER — Ambulatory Visit: Payer: Medicare Other | Admitting: Family Medicine

## 2013-06-03 ENCOUNTER — Ambulatory Visit (INDEPENDENT_AMBULATORY_CARE_PROVIDER_SITE_OTHER): Payer: Medicare Other | Admitting: Family Medicine

## 2013-06-03 ENCOUNTER — Encounter: Payer: Self-pay | Admitting: Family Medicine

## 2013-06-03 VITALS — BP 118/76 | HR 72 | Temp 97.9°F | Ht 66.25 in | Wt 250.0 lb

## 2013-06-03 DIAGNOSIS — E669 Obesity, unspecified: Secondary | ICD-10-CM

## 2013-06-03 DIAGNOSIS — I10 Essential (primary) hypertension: Secondary | ICD-10-CM

## 2013-06-03 DIAGNOSIS — R21 Rash and other nonspecific skin eruption: Secondary | ICD-10-CM

## 2013-06-03 DIAGNOSIS — R7309 Other abnormal glucose: Secondary | ICD-10-CM

## 2013-06-03 NOTE — Patient Instructions (Signed)
Stop up front to let the staff know about your dermatology referral --- tell them you would like to see someone in Dr Ledell Peoples practice  Keep up the weight loss effort Keep watching sugar and salt in diet  Follow up in 6 months for an annual exam with labs prior  Blood pressure is good

## 2013-06-03 NOTE — Progress Notes (Signed)
Pre visit review using our clinic review tool, if applicable. No additional management support is needed unless otherwise documented below in the visit note. 

## 2013-06-03 NOTE — Assessment & Plan Note (Signed)
Lab Results  Component Value Date   HGBA1C 6.1 05/22/2013   This is stable Disc need for wt loss and low sugar diet to avoid DM Re check 6 mo

## 2013-06-03 NOTE — Assessment & Plan Note (Signed)
bp in fair control at this time  BP Readings from Last 1 Encounters:  06/03/13 118/76   No changes needed Disc lifstyle change with low sodium diet and exercise

## 2013-06-03 NOTE — Assessment & Plan Note (Signed)
No imp with hydrocortisone  Disc avoidance of fragrances/detergents ? If rel to hyperglycemia Resembles eczema and per pt is seasonal Ref to dermatologist

## 2013-06-03 NOTE — Progress Notes (Signed)
Subjective:    Patient ID: Monica [name], female    DOB: April 17, 1935, 78 y.o.   MRN: [id number]  HPI Here for f/u of chronic health problems   Saw Cardiology about her diastolic dysfunction  Doing ok with that  She is working on low salt diet   bp is stable today  No cp or palpitations or headaches or edema  No side effects to medicines  BP Readings from Last 3 Encounters:  06/03/13 118/76  04/23/13 148/98  02/27/13 116/76     Wt is down 2 lb  Doing much better with her sodium intake  bmi is 40   Gave up sodas for lent - will try to keep that up through the years   Hyperglycemia  Lab Results  Component Value Date   HGBA1C 6.1 05/22/2013     Wearing support hose when she can   Still having rash around her neck  Wants to see dermatology-has had it on and off for years  ? Eczema - had it as a child Uses cortisone cream and nothing seems to work   Patient Active Problem List   Diagnosis Date Noted  . Venous stasis dermatitis 02/27/2013  . Diffuse cystic mastopathy 02/11/2013  . Chronic diastolic heart failure 36/64/4034    Class: Chronic  . Intertrigo 11/27/2012  . Lump or mass in breast   . Left shoulder pain 06/11/2012  . Abdominal pain, chronic, right lower quadrant 02/23/2012  . Short of breath on exertion 02/10/2012  . Irregular heart beat 02/10/2012  . Yeast vaginitis 09/09/2011  . Urge incontinence of urine 09/09/2011  . Rash and nonspecific skin eruption 08/08/2011  . Other screening mammogram 12/15/2010  . OSTEOARTHRITIS, KNEES, BILATERAL 02/09/2010  . GOUT, UNSPECIFIED 03/20/2008  . HYPERGLYCEMIA 02/27/2008  . HYPERLIPIDEMIA 12/21/2006  . OBESITY 12/21/2006  . ANXIETY 12/21/2006  . DEPRESSION 12/21/2006  . HYPERTENSION 12/21/2006  . CARDIOMEGALY 12/21/2006  . Allergic Rhinitis, Cause Unspecified 12/21/2006  . SCIATICA 12/21/2006  . SLEEP APNEA 12/21/2006  . EDEMA 12/21/2006  . OVEREATING 12/21/2006   Past Medical History  Diagnosis Date  .  Allergic rhinitis   . Anxiety   . Depression   . Hyperlipidemia   . HTN (hypertension)   . Obesity   . Compulsive overeating   . OA (osteoarthritis) of knee     with injections  . Gout   . Cardiomegaly   . Edema   . Other abnormal glucose   . Sciatica   . Unspecified sleep apnea   . Benign neoplasm of breast 2013  . Personal history of tobacco use, presenting hazards to health   . Diffuse cystic mastopathy 2013  . Sinus infection 2010  . Lump or mass in breast 2012  . Family history of malignant neoplasm of breast    Past Surgical History  Procedure Laterality Date  . Breast lumpectomy      right x1 ('89) left x2 ('70s, '90)  . Colonoscopy  11/02    diverticulosis  . US transvaginal pelvic modified  2000, 2001  . Dexa  3/02    normal  . Sleep study  5/05  . Colonoscopy  9/06    diverticulosis, hemorhoids  . Adenosine cardiolite  1/08    low risk   . Knee replacement Right 9/11    R. Dr. Telford Nab  . Cardiac catheterization  2001    normal per pt  . Total abdominal hysterectomy      fibroids, age of 12  .  Appendectomy    . Tonsillectomy    . Eye surgery  2012    cataract  . Total knee arthroplasty Left 2012   History  Substance Use Topics  . Smoking status: Former Smoker -- 0.50 packs/day for 20 years    Types: Cigarettes  . Smokeless tobacco: Not on file     Comment: quit approx. 20 years ago  . Alcohol Use: Yes     Comment: wine occasional   Family History  Problem Relation Age of Onset  . Gout      whole family   . Stroke Father   . Hypertension Mother     (a lot of animosity in their relationship)  . Heart failure Mother   . Prostate cancer Brother   . Hepatitis Brother     Hep C; alcoholism (terminal)  . Breast cancer Sister   . Other Brother     drug addiction   Allergies  Allergen Reactions  . Buspirone Hcl     REACTION: made her sleepy, ? swollen legs  . Lisinopril     REACTION: dizziness  . Prozac [Fluoxetine Hcl]     sleepy    Current Outpatient Prescriptions on File Prior to Visit  Medication Sig Dispense Refill  . allopurinol (ZYLOPRIM) 100 MG tablet TAKE 1 TABLET BY MOUTH DAILY  30 tablet  11  . amLODipine (NORVASC) 5 MG tablet Take 5 mg by mouth daily.      . calcium carbonate (OS-CAL) 600 MG TABS Take 600 mg by mouth daily.       . furosemide (LASIX) 40 MG tablet TAKE ONE (1) TABLET BY MOUTH EVERY DAY  30 tablet  3  . losartan (COZAAR) 50 MG tablet TAKE 1 TABLET BY MOUTH DAILY  30 tablet  5  . magnesium oxide (MAG-OX) 400 MG tablet Take 400 mg by mouth daily. Over the counter       . metoprolol succinate (TOPROL-XL) 100 MG 24 hr tablet TAKE ONE (1) TABLET BY MOUTH EVERY DAY  30 tablet  5  . potassium chloride SA (K-DUR,KLOR-CON) 20 MEQ tablet Take 1 tablet (20 mEq total) by mouth daily.  30 tablet  11  . spironolactone (ALDACTONE) 25 MG tablet Take 1 tablet by mouth daily.       No current facility-administered medications on file prior to visit.    Review of Systems    Review of Systems  Constitutional: Negative for fever, appetite change,  and unexpected weight change.  Eyes: Negative for pain and visual disturbance.  Respiratory: Negative for cough and shortness of breath.  pos for fatigue with exertion but not sob Cardiovascular: Negative for cp or palpitations    Gastrointestinal: Negative for nausea, diarrhea and constipation.  Genitourinary: Negative for urgency and frequency.  Skin: Negative for pallor or rash   Neurological: Negative for weakness, light-headedness, numbness and headaches.  Hematological: Negative for adenopathy. Does not bruise/bleed easily.  Psychiatric/Behavioral: Negative for dysphoric mood. The patient is not nervous/anxious.      Objective:   Physical Exam  Constitutional: She appears well-developed and well-nourished. No distress.  obese and well appearing   HENT:  Head: Normocephalic and atraumatic.  Mouth/Throat: Oropharynx is clear and moist.  Eyes:  Conjunctivae and EOM are normal. Pupils are equal, round, and reactive to light. No scleral icterus.  Neck: Normal range of motion. Neck supple. No JVD present. Carotid bruit is not present. No thyromegaly present.  Cardiovascular: Normal rate, regular rhythm, normal heart sounds and  intact distal pulses.  Exam reveals no gallop.   No murmur heard. Pulmonary/Chest: Effort normal and breath sounds normal. No respiratory distress. She has no wheezes.  No crackles   Abdominal: Soft. Bowel sounds are normal. She exhibits no abdominal bruit.  Musculoskeletal: She exhibits no edema and no tenderness.  Lymphadenopathy:    She has no cervical adenopathy.  Neurological: She is alert. She has normal reflexes. No cranial nerve deficit. She exhibits normal muscle tone. Coordination normal.  Skin: Skin is warm and dry. No rash noted. No erythema. No pallor.  Some scale on back of neck - worse on L side with hyperpigmentation from scratching   Psychiatric: She has a normal mood and affect.          Assessment & Plan:

## 2013-06-03 NOTE — Assessment & Plan Note (Signed)
Discussed how this problem influences overall health and the risks it imposes  Reviewed plan for weight loss with lower calorie diet (via better food choices and also portion control or program like weight watchers) and exercise building up to or more than 30 minutes 5 days per week including some aerobic activity   Unsure how motivated pt is

## 2013-07-09 ENCOUNTER — Other Ambulatory Visit: Payer: Self-pay | Admitting: Family Medicine

## 2013-07-16 ENCOUNTER — Other Ambulatory Visit: Payer: Self-pay | Admitting: Family Medicine

## 2013-09-16 ENCOUNTER — Other Ambulatory Visit: Payer: Self-pay | Admitting: Family Medicine

## 2013-11-25 ENCOUNTER — Telehealth: Payer: Self-pay | Admitting: Family Medicine

## 2013-11-25 DIAGNOSIS — R609 Edema, unspecified: Secondary | ICD-10-CM

## 2013-11-25 DIAGNOSIS — I10 Essential (primary) hypertension: Secondary | ICD-10-CM

## 2013-11-25 DIAGNOSIS — R7301 Impaired fasting glucose: Secondary | ICD-10-CM

## 2013-11-25 DIAGNOSIS — E785 Hyperlipidemia, unspecified: Secondary | ICD-10-CM

## 2013-11-25 NOTE — Telephone Encounter (Signed)
Message copied by Abner Greenspan on Mon Nov 25, 2013  9:16 PM ------      Message from: Ellamae Sia      Created: Wed Nov 20, 2013  5:39 PM      Regarding: Lab orders for Tuesday, 10.6.15       Patient is scheduled for CPX labs, please order future labs, Thanks , Terri       ------

## 2013-11-26 ENCOUNTER — Other Ambulatory Visit (INDEPENDENT_AMBULATORY_CARE_PROVIDER_SITE_OTHER): Payer: Medicare Other

## 2013-11-26 DIAGNOSIS — E785 Hyperlipidemia, unspecified: Secondary | ICD-10-CM

## 2013-11-26 DIAGNOSIS — I10 Essential (primary) hypertension: Secondary | ICD-10-CM

## 2013-11-26 DIAGNOSIS — R7301 Impaired fasting glucose: Secondary | ICD-10-CM

## 2013-11-26 DIAGNOSIS — R609 Edema, unspecified: Secondary | ICD-10-CM

## 2013-11-26 LAB — CBC WITH DIFFERENTIAL/PLATELET
Basophils Absolute: 0 10*3/uL (ref 0.0–0.1)
Basophils Relative: 0.5 % (ref 0.0–3.0)
Eosinophils Absolute: 0.2 10*3/uL (ref 0.0–0.7)
Eosinophils Relative: 2.2 % (ref 0.0–5.0)
HCT: 40 % (ref 36.0–46.0)
Hemoglobin: 13 g/dL (ref 12.0–15.0)
Lymphocytes Relative: 33.3 % (ref 12.0–46.0)
Lymphs Abs: 2.6 10*3/uL (ref 0.7–4.0)
MCHC: 32.5 g/dL (ref 30.0–36.0)
MCV: 86.6 fl (ref 78.0–100.0)
Monocytes Absolute: 0.5 10*3/uL (ref 0.1–1.0)
Monocytes Relative: 6.9 % (ref 3.0–12.0)
Neutro Abs: 4.4 10*3/uL (ref 1.4–7.7)
Neutrophils Relative %: 57.1 % (ref 43.0–77.0)
Platelets: 264 10*3/uL (ref 150.0–400.0)
RBC: 4.62 Mil/uL (ref 3.87–5.11)
RDW: 15.1 % (ref 11.5–15.5)
WBC: 7.7 10*3/uL (ref 4.0–10.5)

## 2013-11-26 LAB — HEMOGLOBIN A1C: Hgb A1c MFr Bld: 6.2 % (ref 4.6–6.5)

## 2013-11-27 LAB — LIPID PANEL
Cholesterol: 172 mg/dL (ref 0–200)
HDL: 39.2 mg/dL (ref >39.00)
LDL Cholesterol: 114 mg/dL — ABNORMAL HIGH (ref 0–99)
NonHDL: 132.8
Total CHOL/HDL Ratio: 4
Triglycerides: 93 mg/dL (ref 0.0–149.0)
VLDL: 18.6 mg/dL (ref 0.0–40.0)

## 2013-11-27 LAB — COMPREHENSIVE METABOLIC PANEL
ALT: 26 U/L (ref 0–35)
AST: 27 U/L (ref 0–37)
Albumin: 3.8 g/dL (ref 3.5–5.2)
Alkaline Phosphatase: 91 U/L (ref 39–117)
BUN: 16 mg/dL (ref 6–23)
CO2: 28 mEq/L (ref 19–32)
Calcium: 8.8 mg/dL (ref 8.4–10.5)
Chloride: 104 mEq/L (ref 96–112)
Creatinine, Ser: 0.8 mg/dL (ref 0.4–1.2)
GFR: 88.35 mL/min (ref >60.00)
Glucose, Bld: 94 mg/dL (ref 70–99)
Potassium: 3.9 mEq/L (ref 3.5–5.1)
Sodium: 139 mEq/L (ref 135–145)
Total Bilirubin: 0.4 mg/dL (ref 0.2–1.2)
Total Protein: 7.2 g/dL (ref 6.0–8.3)

## 2013-11-27 LAB — TSH: TSH: 1.82 u[IU]/mL (ref 0.35–4.50)

## 2013-12-03 ENCOUNTER — Ambulatory Visit (INDEPENDENT_AMBULATORY_CARE_PROVIDER_SITE_OTHER): Payer: Medicare Other | Admitting: Family Medicine

## 2013-12-03 ENCOUNTER — Encounter: Payer: Self-pay | Admitting: Family Medicine

## 2013-12-03 VITALS — BP 132/78 | HR 73 | Temp 98.3°F | Ht 65.5 in | Wt 259.0 lb

## 2013-12-03 DIAGNOSIS — Z23 Encounter for immunization: Secondary | ICD-10-CM

## 2013-12-03 DIAGNOSIS — Z0000 Encounter for general adult medical examination without abnormal findings: Secondary | ICD-10-CM

## 2013-12-03 DIAGNOSIS — R7301 Impaired fasting glucose: Secondary | ICD-10-CM

## 2013-12-03 DIAGNOSIS — E785 Hyperlipidemia, unspecified: Secondary | ICD-10-CM

## 2013-12-03 DIAGNOSIS — E669 Obesity, unspecified: Secondary | ICD-10-CM

## 2013-12-03 DIAGNOSIS — I10 Essential (primary) hypertension: Secondary | ICD-10-CM

## 2013-12-03 NOTE — Assessment & Plan Note (Signed)
Improved but not quite to goal  Disc goals for lipids and reasons to control them Rev labs with pt Rev low sat fat diet in detail

## 2013-12-03 NOTE — Assessment & Plan Note (Signed)
Discussed how this problem influences overall health and the risks it imposes  Pt is now morbidly obese-this affects quality of life an mobility  Reviewed plan for weight loss with lower calorie diet (via better food choices and also portion control or program like weight watchers) and exercise building up to or more than 30 minutes 5 days per week including some aerobic activity   Pt continues to struggle with lack of motivation and compulsive overeating

## 2013-12-03 NOTE — Progress Notes (Signed)
Subjective:    Patient ID: Monica [name], female    DOB: [birth year], 78 y.o.   MRN: [id number]  HPI I have personally reviewed the Medicare Annual Wellness questionnaire and have noted 1. The patient's medical and social history 2. Their use of alcohol, tobacco or illicit drugs 3. Their current medications and supplements 4. The patient's functional ability including ADL's, fall risks, home safety risks and hearing or visual             impairment. 5. Diet and physical activities 6. Evidence for depression or mood disorders  Getting over a bad cold  Also "totally stressed out all the time"   The patients weight, height, BMI have been recorded in the chart and visual acuity is per eye clinic.  I have made referrals, counseling and provided education to the patient based review of the above and I have provided the pt with a written personalized care plan for preventive services.  See scanned forms.  Routine anticipatory guidance given to patient.  See health maintenance. Colon cancer screening 9/06 - she will be over 77 for her 10 year recall  Breast cancer screening- mammogram 02/18/13 - will be due in Jan - she will schedule it  Self breast exam-no lumps or changes / does have a lot of skin tags under breasts  Had a hysterectomy - no gyn symptoms  Flu vaccine-will get that today  Tetanus vaccine - over 10 years  Pneumovax- overdue-will get that today  Zoster vaccine 8/14   Advance directive- she needs to review her current one , given packet to work on that  Cognitive function addressed- see scanned forms- and if abnormal then additional documentation follows. - her brain feels cluttered at times when she multitasks -otherwise no problems   PMH and SH reviewed  Meds, vitals, and allergies reviewed.   ROS: See HPI.  Otherwise negative.     Hyperglycemia  Lab Results  Component Value Date   HGBA1C 6.2 11/26/2013   Up from 6.1 Is prediabetic  She does not eat a lot of  sweets - but eats a lot of salty carbohydrates  Is trying to cut back on sodas  She drinks sweet tea as well    Wt is up 9 lb with bmi of 42 She is not surprised  Had family reunions and a lot of events with eating  She is also a compulsive overeater and has not been able to work on this problem (? Motivated) This is beginning to affect her mobility  Also has a tremendous amount of stress - mainly directing a choir Not much she can do for this   bp is stable today  No cp or palpitations or headaches or edema  No side effects to medicines  BP Readings from Last 3 Encounters:  12/03/13 132/78  06/03/13 118/76  04/23/13 148/98      Hyperlipidemia Lab Results  Component Value Date   CHOL 172 11/26/2013   CHOL 200 02/25/2013   CHOL 196 12/10/2010   Lab Results  Component Value Date   HDL 39.20 11/26/2013   HDL 39.90 02/25/2013   HDL 45.30 12/10/2010   Lab Results  Component Value Date   LDLCALC 114* 11/26/2013   LDLCALC 138* 02/25/2013   LDLCALC 122* 12/10/2010   Lab Results  Component Value Date   TRIG 93.0 11/26/2013   TRIG 112.0 02/25/2013   TRIG 143.0 12/10/2010   Lab Results  Component Value Date   CHOLHDL 4 11/26/2013  CHOLHDL 5 02/25/2013   CHOLHDL 4 12/10/2010   Lab Results  Component Value Date   LDLDIRECT 131.8 12/09/2009   LDLDIRECT 135.4 06/16/2009   LDLDIRECT 137.4 12/10/2008   no exercise at all ---cannot motivate herself to do it  LDL is improved a little bit  Needs to watch fats in diet more carefully   dexa nl 3/02 -normal  No falls or fractures   Patient Active Problem List   Diagnosis Date Noted  . Encounter for Medicare annual wellness exam 12/03/2013  . Rash of neck 06/03/2013  . Venous stasis dermatitis 02/27/2013  . Diffuse cystic mastopathy 02/11/2013  . Chronic diastolic heart failure 33/82/5053    Class: Chronic  . Intertrigo 11/27/2012  . Lump or mass in breast   . Abdominal pain, chronic, right lower quadrant 02/23/2012  . Urge  incontinence of urine 09/09/2011  . Rash and nonspecific skin eruption 08/08/2011  . OSTEOARTHRITIS, KNEES, BILATERAL 02/09/2010  . GOUT, UNSPECIFIED 03/20/2008  . Fasting hyperglycemia 02/27/2008  . Hyperlipidemia LDL goal <100 12/21/2006  . Obesity 12/21/2006  . ANXIETY 12/21/2006  . DEPRESSION 12/21/2006  . Essential hypertension 12/21/2006  . CARDIOMEGALY 12/21/2006  . Allergic Rhinitis, Cause Unspecified 12/21/2006  . SCIATICA 12/21/2006  . SLEEP APNEA 12/21/2006  . EDEMA 12/21/2006  . OVEREATING 12/21/2006   Past Medical History  Diagnosis Date  . Allergic rhinitis   . Anxiety   . Depression   . Hyperlipidemia   . HTN (hypertension)   . Obesity   . Compulsive overeating   . OA (osteoarthritis) of knee     with injections  . Gout   . Cardiomegaly   . Edema   . Other abnormal glucose   . Sciatica   . Unspecified sleep apnea   . Benign neoplasm of breast 2013  . Personal history of tobacco use, presenting hazards to health   . Diffuse cystic mastopathy 2013  . Sinus infection 2010  . Lump or mass in breast 2012  . Family history of malignant neoplasm of breast    Past Surgical History  Procedure Laterality Date  . Breast lumpectomy      right x1 ('89) left x2 ('70s, '90)  . Colonoscopy  11/02    diverticulosis  . US transvaginal pelvic modified  2000, 2001  . Dexa  3/02    normal  . Sleep study  5/05  . Colonoscopy  9/06    diverticulosis, hemorhoids  . Adenosine cardiolite  1/08    low risk   . Knee replacement Right 9/11    R. Dr. Telford Nab  . Cardiac catheterization  2001    normal per pt  . Total abdominal hysterectomy      fibroids, age of 68  . Appendectomy    . Tonsillectomy    . Eye surgery  2012    cataract  . Total knee arthroplasty Left 2012   History  Substance Use Topics  . Smoking status: Former Smoker -- 0.50 packs/day for 20 years    Types: Cigarettes  . Smokeless tobacco: Not on file     Comment: quit approx. 20 years ago  .  Alcohol Use: Yes     Comment: wine occasional   Family History  Problem Relation Age of Onset  . Gout      whole family   . Stroke Father   . Hypertension Mother     (a lot of animosity in their relationship)  . Heart failure Mother   .  Prostate cancer Brother   . Hepatitis Brother     Hep C; alcoholism (terminal)  . Breast cancer Sister   . Other Brother     drug addiction   Allergies  Allergen Reactions  . Buspirone Hcl     REACTION: made her sleepy, ? swollen legs  . Lisinopril     REACTION: dizziness  . Prozac [Fluoxetine Hcl]     sleepy   Current Outpatient Prescriptions on File Prior to Visit  Medication Sig Dispense Refill  . allopurinol (ZYLOPRIM) 100 MG tablet TAKE 1 TABLET BY MOUTH DAILY  30 tablet  11  . amLODipine (NORVASC) 5 MG tablet Take 5 mg by mouth daily.      . calcium carbonate (OS-CAL) 600 MG TABS Take 600 mg by mouth daily.       . furosemide (LASIX) 40 MG tablet TAKE 1 TABLET BY MOUTH DAILY  30 tablet  5  . losartan (COZAAR) 50 MG tablet TAKE 1 TABLET BY MOUTH DAILY  30 tablet  2  . magnesium oxide (MAG-OX) 400 MG tablet Take 400 mg by mouth daily. Over the counter       . metoprolol succinate (TOPROL-XL) 100 MG 24 hr tablet TAKE 1 TABLET BY MOUTH DAILY  30 tablet  5  . potassium chloride SA (K-DUR,KLOR-CON) 20 MEQ tablet Take 1 tablet (20 mEq total) by mouth daily.  30 tablet  11   No current facility-administered medications on file prior to visit.    Review of Systems Review of Systems  Constitutional: Negative for fever, appetite change, fatigue and unexpected weight change.  Eyes: Negative for pain and visual disturbance.  Respiratory: Negative for cough and shortness of breath.   Cardiovascular: Negative for cp or palpitations   pos for generally poor exercise tolerance, neg for PND or orthopnea  Gastrointestinal: Negative for nausea, diarrhea and constipation. pos for chronic bloating  Genitourinary: Negative for urgency and frequency. pos  for occ incontinence baseline  Skin: Negative for pallor or rash   MSK pos for knee pain bilat  Neurological: Negative for weakness, light-headedness, numbness and headaches.  Hematological: Negative for adenopathy. Does not bruise/bleed easily.  Psychiatric/Behavioral: Negative for dysphoric mood. The patient is  nervous/anxious.  pos for stressors        Objective:   Physical Exam  Constitutional: She appears well-developed and well-nourished. No distress.  Morbidly obese and well appearing   HENT:  Head: Normocephalic and atraumatic.  Right Ear: External ear normal.  Left Ear: External ear normal.  Mouth/Throat: Oropharynx is clear and moist.  Eyes: Conjunctivae and EOM are normal. Pupils are equal, round, and reactive to light. No scleral icterus.  Neck: Normal range of motion. Neck supple. No JVD present. Carotid bruit is not present. No thyromegaly present.  Cardiovascular: Normal rate, regular rhythm, normal heart sounds and intact distal pulses.  Exam reveals no gallop.   Pulmonary/Chest: Effort normal and breath sounds normal. No respiratory distress. She has no wheezes. She exhibits no tenderness.  Abdominal: Soft. Bowel sounds are normal. She exhibits no distension, no abdominal bruit and no mass. There is no tenderness.  Genitourinary: No breast swelling, tenderness, discharge or bleeding.  Breast exam: No mass, nodules, thickening, tenderness, bulging, retraction, inflamation, nipple discharge or skin changes noted.  No axillary or clavicular LA.      Musculoskeletal: She exhibits no edema and no tenderness.  Poor rom of knees Obesity affects her mobility  Lymphadenopathy:    She has no cervical adenopathy.  Neurological: She is alert. She has normal reflexes. No cranial nerve deficit. She exhibits normal muscle tone. Coordination normal.  Skin: Skin is warm and dry. No rash noted. No erythema. No pallor.  Psychiatric: She has a normal mood and affect.            Assessment & Plan:   Problem List Items Addressed This Visit     Cardiovascular and Mediastinum   Essential hypertension      bp in fair control at this time  BP Readings from Last 1 Encounters:  12/03/13 132/78   No changes needed Disc lifstyle change with low sodium diet and exercise  Labs reviewed Enc wt loss       Endocrine   Fasting hyperglycemia      Lab Results  Component Value Date   HGBA1C 6.2 11/26/2013   This is up 0.1 Disc imp of wt loss to avoid DM  Also inst to stop sodas and sweet tea, then cut carbs  Rev plan for exercise       Other   Hyperlipidemia LDL goal <100     Improved but not quite to goal  Disc goals for lipids and reasons to control them Rev labs with pt Rev low sat fat diet in detail      Obesity     Discussed how this problem influences overall health and the risks it imposes  Pt is now morbidly obese-this affects quality of life an mobility  Reviewed plan for weight loss with lower calorie diet (via better food choices and also portion control or program like weight watchers) and exercise building up to or more than 30 minutes 5 days per week including some aerobic activity   Pt continues to struggle with lack of motivation and compulsive overeating       Encounter for Medicare annual wellness exam - Primary     Reviewed health habits including diet and exercise and skin cancer prevention Reviewed appropriate screening tests for age  Also reviewed health mt list, fam hx and immunization status , as well as social and family history   See HPI Labs reviewed in detail  Flu shot today Pneumovax today  She will get Tdap at the health dept for affordability       Other Visit Diagnoses   Need for 23-polyvalent pneumococcal polysaccharide vaccine        Relevant Orders       Pneumococcal polysaccharide vaccine 23-valent greater than or equal to 2yo subcutaneous/IM (Completed)    Need for prophylactic vaccination and inoculation  against influenza        Relevant Orders       Flu Vaccine QUAD 36+ mos PF IM (Fluarix Quad PF) (Completed)

## 2013-12-03 NOTE — Assessment & Plan Note (Signed)
bp in fair control at this time  BP Readings from Last 1 Encounters:  12/03/13 132/78   No changes needed Disc lifstyle change with low sodium diet and exercise  Labs reviewed Enc wt loss

## 2013-12-03 NOTE — Patient Instructions (Signed)
You are due for a mammogram in January-you can make your own appointment  Flu vaccine and pneumonia vaccine today  You are due for a tetanus shot (Tdap) - get that at the health department (it will be cheaper) Work on your advanced directive (living will /power of attorney)- read the blue packet I gave you  Follow up in 6 months with labs prior   Please try hard to work on weight loss for better health  Also exercise

## 2013-12-03 NOTE — Assessment & Plan Note (Signed)
Lab Results  Component Value Date   HGBA1C 6.2 11/26/2013   This is up 0.1 Disc imp of wt loss to avoid DM  Also inst to stop sodas and sweet tea, then cut carbs  Rev plan for exercise

## 2013-12-03 NOTE — Progress Notes (Signed)
Pre visit review using our clinic review tool, if applicable. No additional management support is needed unless otherwise documented below in the visit note. 

## 2013-12-03 NOTE — Assessment & Plan Note (Signed)
Reviewed health habits including diet and exercise and skin cancer prevention Reviewed appropriate screening tests for age  Also reviewed health mt list, fam hx and immunization status , as well as social and family history   See HPI Labs reviewed in detail  Flu shot today Pneumovax today  She will get Tdap at the health dept for affordability

## 2013-12-20 ENCOUNTER — Other Ambulatory Visit: Payer: Self-pay | Admitting: Interventional Cardiology

## 2013-12-20 ENCOUNTER — Other Ambulatory Visit: Payer: Self-pay | Admitting: Family Medicine

## 2013-12-23 ENCOUNTER — Encounter: Payer: Self-pay | Admitting: Family Medicine

## 2014-01-21 ENCOUNTER — Other Ambulatory Visit: Payer: Self-pay | Admitting: Family Medicine

## 2014-01-23 ENCOUNTER — Telehealth: Payer: Self-pay | Admitting: Interventional Cardiology

## 2014-01-23 MED ORDER — AMLODIPINE BESYLATE 5 MG PO TABS: 5.0000 mg | ORAL_TABLET | Freq: Every day | ORAL | Status: DC

## 2014-01-23 NOTE — Telephone Encounter (Signed)
Pt aware Amlodipine 5mg  qd refilled. Pt is to keep upcoming appt.pt verbalized understanding.

## 2014-01-23 NOTE — Telephone Encounter (Signed)
New Msg   Pt states that she needs a prescription refilled. Pt states that she saw Dr. Tamala Julian last night and he instructed her to contact [facility] today. [phone].

## 2014-02-17 ENCOUNTER — Ambulatory Visit: Payer: Medicare Other | Admitting: General Surgery

## 2014-02-17 ENCOUNTER — Encounter: Payer: Self-pay | Admitting: General Surgery

## 2014-02-27 ENCOUNTER — Encounter: Payer: Self-pay | Admitting: General Surgery

## 2014-02-27 ENCOUNTER — Ambulatory Visit (INDEPENDENT_AMBULATORY_CARE_PROVIDER_SITE_OTHER): Payer: Medicare Other | Admitting: General Surgery

## 2014-02-27 VITALS — BP 154/84 | HR 82 | Resp 16 | Ht 67.0 in | Wt 264.0 lb

## 2014-02-27 DIAGNOSIS — Z803 Family history of malignant neoplasm of breast: Secondary | ICD-10-CM

## 2014-02-27 DIAGNOSIS — N6019 Diffuse cystic mastopathy of unspecified breast: Secondary | ICD-10-CM

## 2014-02-27 NOTE — Progress Notes (Signed)
Patient ID: Monica [name], female   DOB: 1936/01/08, 79 y.o.   MRN: [id number]  Chief Complaint  Patient presents with  . Follow-up    mammogram    HPI Monica [name] is a 79 y.o. female.  who presents for a breast evaluation. The most recent mammogram was done on 02-11-14.  Patient does perform regular self breast checks and gets regular mammograms done.  No new breast issues.  HPI  Past Medical History  Diagnosis Date  . Allergic rhinitis   . Anxiety   . Depression   . Hyperlipidemia   . HTN (hypertension)   . Obesity   . Compulsive overeating   . OA (osteoarthritis) of knee     with injections  . Gout   . Cardiomegaly   . Edema   . Other abnormal glucose   . Sciatica   . Unspecified sleep apnea   . Benign neoplasm of breast 2013  . Personal history of tobacco use, presenting hazards to health   . Diffuse cystic mastopathy 2013  . Sinus infection 2010  . Lump or mass in breast 2012  . Family history of malignant neoplasm of breast     Past Surgical History  Procedure Laterality Date  . Breast lumpectomy      right x1 ('89) left x2 ('70s, '90)  . Colonoscopy  11/02    diverticulosis  . US transvaginal pelvic modified  2000, 2001  . Dexa  3/02    normal  . Sleep study  5/05  . Colonoscopy  9/06    diverticulosis, hemorhoids  . Adenosine cardiolite  1/08    low risk   . Knee replacement Right 9/11    R. Dr. Telford Nab  . Cardiac catheterization  2001    normal per pt  . Total abdominal hysterectomy      fibroids, age of 30  . Appendectomy    . Tonsillectomy    . Eye surgery  2012    cataract  . Total knee arthroplasty Left 2012    Family History  Problem Relation Age of Onset  . Gout      whole family   . Stroke Father   . Hypertension Mother     (a lot of animosity in their relationship)  . Heart failure Mother   . Prostate cancer Brother   . Hepatitis Brother     Hep C; alcoholism (terminal)  . Breast cancer Sister   . Other Brother    drug addiction    Social History History  Substance Use Topics  . Smoking status: Former Smoker -- 0.50 packs/day for 20 years    Types: Cigarettes  . Smokeless tobacco: Not on file     Comment: quit approx. 20 years ago  . Alcohol Use: Yes     Comment: wine occasional    Allergies  Allergen Reactions  . Buspirone Hcl     REACTION: made her sleepy, ? swollen legs  . Lisinopril     REACTION: dizziness  . Prozac [Fluoxetine Hcl]     sleepy    Current Outpatient Prescriptions  Medication Sig Dispense Refill  . allopurinol (ZYLOPRIM) 100 MG tablet TAKE 1 TABLET BY MOUTH DAILY 30 tablet 11  . amLODipine (NORVASC) 5 MG tablet Take 1 tablet (5 mg total) by mouth daily. 30 tablet 4  . calcium carbonate (OS-CAL) 600 MG TABS Take 600 mg by mouth daily.     . furosemide (LASIX) 40 MG tablet TAKE 1 TABLET BY MOUTH  DAILY 30 tablet 5  . losartan (COZAAR) 50 MG tablet TAKE 1 TABLET BY MOUTH DAILY 30 tablet 5  . magnesium oxide (MAG-OX) 400 MG tablet Take 400 mg by mouth daily. Over the counter     . metoprolol succinate (TOPROL-XL) 100 MG 24 hr tablet TAKE 1 TABLET BY MOUTH DAILY 30 tablet 5  . Omega-3 Fatty Acids (FISH OIL PO) Take by mouth.    . potassium chloride SA (K-DUR,KLOR-CON) 20 MEQ tablet Take 1 tablet (20 mEq total) by mouth daily. 30 tablet 11   No current facility-administered medications for this visit.    Review of Systems Review of Systems  Constitutional: Negative.   Respiratory: Negative.   Cardiovascular: Negative.     Blood pressure 154/84, pulse 82, resp. rate 16, height 5\' 7"  (1.702 m), weight 264 lb (119.75 kg).  Physical Exam Physical Exam  Constitutional: She is oriented to person, place, and time. She appears well-developed and well-nourished.  Eyes: Conjunctivae are normal. No scleral icterus.  Neck: Neck supple.  Cardiovascular: Normal rate, regular rhythm and normal heart sounds.   Pulmonary/Chest: Effort normal and breath sounds normal. Right  breast exhibits no inverted nipple, no mass, no nipple discharge, no skin change and no tenderness. Left breast exhibits no inverted nipple, no mass, no nipple discharge, no skin change and no tenderness.  Abdominal: Soft. There is no tenderness.  Lymphadenopathy:    She has no cervical adenopathy.    She has no axillary adenopathy.  Neurological: She is alert and oriented to person, place, and time.  Skin: Skin is warm and dry.    Data Reviewed Mammogram report reviewed and stable.  Assessment    Stable physical exam. FH of breast cancer and history of FCD    Plan        The patient has been asked to return to the office in one year with a bilateral screening mammogram.    SANKAR,SEEPLAPUTHUR G 02/28/2014, 6:08 AM

## 2014-02-27 NOTE — Patient Instructions (Signed)
Continue self breast exams. Call office for any new breast issues or concerns.The patient has been asked to return to the office in one year with a bilateral screening mammogram. 

## 2014-02-28 ENCOUNTER — Encounter: Payer: Self-pay | Admitting: General Surgery

## 2014-02-28 DIAGNOSIS — Z803 Family history of malignant neoplasm of breast: Secondary | ICD-10-CM | POA: Insufficient documentation

## 2014-03-11 ENCOUNTER — Other Ambulatory Visit: Payer: Self-pay | Admitting: Family Medicine

## 2014-03-28 ENCOUNTER — Encounter: Payer: Self-pay | Admitting: Interventional Cardiology

## 2014-03-28 ENCOUNTER — Ambulatory Visit (INDEPENDENT_AMBULATORY_CARE_PROVIDER_SITE_OTHER): Payer: Medicare Other | Admitting: Interventional Cardiology

## 2014-03-28 VITALS — BP 134/76 | HR 70 | Ht 67.0 in | Wt 265.0 lb

## 2014-03-28 DIAGNOSIS — I10 Essential (primary) hypertension: Secondary | ICD-10-CM

## 2014-03-28 DIAGNOSIS — I5032 Chronic diastolic (congestive) heart failure: Secondary | ICD-10-CM

## 2014-03-28 DIAGNOSIS — E785 Hyperlipidemia, unspecified: Secondary | ICD-10-CM

## 2014-03-28 NOTE — Progress Notes (Signed)
Patient ID: Monica [name], female   DOB: 27-Mar-1935, 79 y.o.   MRN: [id number]    Cardiology Office Note   Date:  03/28/2014   ID:  Monica [name], DOB 11-26-1935, MRN [id number]  PCP:  Loura Pardon, MD  Cardiologist:   Sinclair Grooms, MD   No chief complaint on file.     History of Present Illness: Monica [name] is a 79 y.o. female who presents for HBP and admits to poor dietary habits and increased salt. She is excited that her daughter will be moving from [place] back to [place]. She denies orthopnea. There is lower extremity edema. She takes her medications as prescribed.    Past Medical History  Diagnosis Date  . Allergic rhinitis   . Anxiety   . Depression   . Hyperlipidemia   . HTN (hypertension)   . Obesity   . Compulsive overeating   . OA (osteoarthritis) of knee     with injections  . Gout   . Cardiomegaly   . Edema   . Other abnormal glucose   . Sciatica   . Unspecified sleep apnea   . Benign neoplasm of breast 2013  . Personal history of tobacco use, presenting hazards to health   . Diffuse cystic mastopathy 2013  . Sinus infection 2010  . Lump or mass in breast 2012  . Family history of malignant neoplasm of breast     Past Surgical History  Procedure Laterality Date  . Breast lumpectomy      right x1 ('89) left x2 ('70s, '90)  . Colonoscopy  11/02    diverticulosis  . US transvaginal pelvic modified  2000, 2001  . Dexa  3/02    normal  . Sleep study  5/05  . Colonoscopy  9/06    diverticulosis, hemorhoids  . Adenosine cardiolite  1/08    low risk   . Knee replacement Right 9/11    R. Dr. Telford Nab  . Cardiac catheterization  2001    normal per pt  . Total abdominal hysterectomy      fibroids, age of 86  . Appendectomy    . Tonsillectomy    . Eye surgery  2012    cataract  . Total knee arthroplasty Left 2012     Current Outpatient Prescriptions  Medication Sig Dispense Refill  . allopurinol (ZYLOPRIM) 100 MG tablet TAKE 1 TABLET  BY MOUTH DAILY 30 tablet 5  . amLODipine (NORVASC) 5 MG tablet Take 1 tablet (5 mg total) by mouth daily. 30 tablet 4  . calcium carbonate (OS-CAL) 600 MG TABS Take 600 mg by mouth daily.     . furosemide (LASIX) 40 MG tablet TAKE 1 TABLET BY MOUTH DAILY 30 tablet 5  . losartan (COZAAR) 50 MG tablet TAKE 1 TABLET BY MOUTH DAILY 30 tablet 5  . magnesium oxide (MAG-OX) 400 MG tablet Take 400 mg by mouth daily. Over the counter     . metoprolol succinate (TOPROL-XL) 100 MG 24 hr tablet TAKE 1 TABLET BY MOUTH DAILY 30 tablet 5  . Omega-3 Fatty Acids (FISH OIL PO) Take by mouth.    . potassium chloride SA (K-DUR,KLOR-CON) 20 MEQ tablet TAKE 1 TABLET BY MOUTH DAILY 30 tablet 5   No current facility-administered medications for this visit.    Allergies:   Buspirone hcl; Lisinopril; and Prozac    Social History:  The patient  reports that she has quit smoking. Her smoking use included Cigarettes. She has a 10 pack-year smoking  history. She does not have any smokeless tobacco history on file. She reports that she drinks alcohol. She reports that she does not use illicit drugs.   Family History:  The patient's family history includes Breast cancer in her sister; Gout in an other family member; Heart failure in her mother; Hepatitis in her brother; Hypertension in her mother; Other in her brother; Prostate cancer in her brother; Stroke in her father.    ROS:  Please see the history of present illness.   Otherwise, review of systems are positive for edema in legs and exertional dyspnea.   All other systems are reviewed and negative.    PHYSICAL EXAM: VS:  BP 134/76 mmHg  Pulse 70  Ht 5\' 7"  (1.702 m)  Wt 265 lb (120.203 kg)  BMI 41.50 kg/m2 , BMI Body mass index is 41.5 kg/(m^2). GEN: Well nourished, well developed, in no acute distress HEENT: normal Neck: no JVD, carotid bruits, or masses Cardiac: RRR; no murmurs, rubs, or gallops,no edema  Respiratory:  clear to auscultation bilaterally,  normal work of breathing GI: soft, nontender, nondistended, + BS MS: no deformity or atrophy Skin: warm and dry, no rash Neuro:  Strength and sensation are intact Psych: euthymic mood, full affect   EKG:  EKG is ordered today. The ekg ordered today demonstrates sinus rhythm with nonspecific T-wave abnormality.   Recent Labs: 11/26/2013: ALT 26; BUN 16; Creatinine 0.8; Hemoglobin 13.0; Platelets 264.0; Potassium 3.9; Sodium 139; TSH 1.82    Lipid Panel    Component Value Date/Time   CHOL 172 11/26/2013 1019   TRIG 93.0 11/26/2013 1019   HDL 39.20 11/26/2013 1019   CHOLHDL 4 11/26/2013 1019   VLDL 18.6 11/26/2013 1019   LDLCALC 114* 11/26/2013 1019   LDLDIRECT 131.8 12/09/2009 1643      Wt Readings from Last 3 Encounters:  03/28/14 265 lb (120.203 kg)  02/27/14 264 lb (119.75 kg)  12/03/13 259 lb (117.482 kg)      Other studies Reviewed: Additional studies/ records that were reviewed today include: . Review of the above records demonstrates:    ASSESSMENT AND PLAN:  1.  Essential hypertension with good control on today's evaluation 2. Hyperlipidemia 3. Chronic diastolic heart failure, functional class II without evidence of volume overload    Current medicines are reviewed at length with the patient today.  The patient does not have concerns regarding medicines.  The following changes have been made:  no change  Labs/ tests ordered today include: none No orders of the defined types were placed in this encounter.     Disposition:   FU with Linard Millers in 1 Year.   Signed, Sinclair Grooms, MD  03/28/2014 8:51 AM    [facility] [address], [place], [place]  [address] Phone: [phone]; Fax: [phone]

## 2014-03-28 NOTE — Patient Instructions (Signed)
Your physician recommends that you continue on your current medications as directed. Please refer to the Current Medication list given to you today.  Your physician wants you to follow-up in: 1 year with Dr.Smith You will receive a reminder letter in the mail two months in advance. If you don't receive a letter, please call our office to schedule the follow-up appointment.  

## 2014-06-19 ENCOUNTER — Other Ambulatory Visit: Payer: Self-pay | Admitting: Interventional Cardiology

## 2014-06-25 ENCOUNTER — Other Ambulatory Visit: Payer: Self-pay | Admitting: Family Medicine

## 2014-07-17 ENCOUNTER — Other Ambulatory Visit: Payer: Self-pay | Admitting: Family Medicine

## 2014-07-18 NOTE — Telephone Encounter (Signed)
Electronic refill request, no future appt., and last OV was a CPE on 12/03/13, please advise

## 2014-07-18 NOTE — Telephone Encounter (Signed)
Please schedule PE when due and refill until then

## 2014-07-25 ENCOUNTER — Encounter: Payer: Self-pay | Admitting: Family Medicine

## 2014-07-25 ENCOUNTER — Ambulatory Visit (INDEPENDENT_AMBULATORY_CARE_PROVIDER_SITE_OTHER): Payer: Medicare Other | Admitting: Family Medicine

## 2014-07-25 VITALS — BP 128/74 | HR 72 | Temp 98.4°F | Ht 67.0 in | Wt 262.5 lb

## 2014-07-25 DIAGNOSIS — I10 Essential (primary) hypertension: Secondary | ICD-10-CM | POA: Diagnosis not present

## 2014-07-25 DIAGNOSIS — R7301 Impaired fasting glucose: Secondary | ICD-10-CM | POA: Diagnosis not present

## 2014-07-25 DIAGNOSIS — E669 Obesity, unspecified: Secondary | ICD-10-CM | POA: Diagnosis not present

## 2014-07-25 DIAGNOSIS — F4323 Adjustment disorder with mixed anxiety and depressed mood: Secondary | ICD-10-CM | POA: Diagnosis not present

## 2014-07-25 LAB — COMPREHENSIVE METABOLIC PANEL
ALT: 23 U/L (ref 0–35)
AST: 22 U/L (ref 0–37)
Albumin: 4.1 g/dL (ref 3.5–5.2)
Alkaline Phosphatase: 98 U/L (ref 39–117)
BUN: 15 mg/dL (ref 6–23)
CO2: 30 mEq/L (ref 19–32)
Calcium: 9.4 mg/dL (ref 8.4–10.5)
Chloride: 105 mEq/L (ref 96–112)
Creatinine, Ser: 0.88 mg/dL (ref 0.40–1.20)
GFR: 80.15 mL/min (ref >60.00)
Glucose, Bld: 103 mg/dL — ABNORMAL HIGH (ref 70–99)
Potassium: 3.5 mEq/L (ref 3.5–5.1)
Sodium: 140 mEq/L (ref 135–145)
Total Bilirubin: 0.4 mg/dL (ref 0.2–1.2)
Total Protein: 7.2 g/dL (ref 6.0–8.3)

## 2014-07-25 LAB — HEMOGLOBIN A1C: Hgb A1c MFr Bld: 6.1 % (ref 4.6–6.5)

## 2014-07-25 MED ORDER — LOSARTAN POTASSIUM 50 MG PO TABS: 50.0000 mg | ORAL_TABLET | Freq: Every day | ORAL | Status: DC

## 2014-07-25 MED ORDER — ALLOPURINOL 100 MG PO TABS: 100.0000 mg | ORAL_TABLET | Freq: Every day | ORAL | Status: DC

## 2014-07-25 MED ORDER — METOPROLOL SUCCINATE ER 100 MG PO TB24: 100.0000 mg | ORAL_TABLET | Freq: Every day | ORAL | Status: DC

## 2014-07-25 NOTE — Progress Notes (Signed)
Patient ID: Monica [name], female    DOB: 01-Apr-1935, 79 y.o.   MRN: [id number]  HPI Here for f/u of chronic health problems  Wt is up 7lb with bmi of 63 Obese  Stays stressed out all the time  Stressors come and go  "tries to control her stress thoughts" Worsens with age   This all makes her tired   She will not see a counselor "too stubborn"  Cannot stand for someone to tell her what "I already know"  Has tried meditation   Daughter moved closer-that is a positive thing for her -gives her joy   bp is stable today  No cp or palpitations or headaches or edema  No side effects to medicines  BP Readings from Last 3 Encounters:  07/25/14 128/74  03/28/14 134/76  02/27/14 154/84     Hyperglycemia Lab Results  Component Value Date   HGBA1C 6.2 11/26/2013   Due for labs  Diet is not optimal  Continues to gain weight  Not consistent with checking her blood sugar -cannot motivate herself  occ tingling in toes  Has done the DM ed program at [facility] she needs to eat better and drink less soda   Had cardiol ref for DD   Patient Active Problem List   Diagnosis Date Noted  . Family history of breast cancer 02/28/2014  . Venous stasis dermatitis 02/27/2013  . Diffuse cystic mastopathy 02/11/2013  . Chronic diastolic heart failure 66/44/0347    Class: Chronic  . Intertrigo 11/27/2012  . Lump or mass in breast   . Abdominal pain, chronic, right lower quadrant 02/23/2012  . Urge incontinence of urine 09/09/2011  . Rash and nonspecific skin eruption 08/08/2011  . OSTEOARTHRITIS, KNEES, BILATERAL 02/09/2010  . GOUT, UNSPECIFIED 03/20/2008  . Fasting hyperglycemia 02/27/2008  . Hyperlipidemia LDL goal <100 12/21/2006  . Obesity 12/21/2006  . ANXIETY 12/21/2006  . DEPRESSION 12/21/2006  . Essential hypertension 12/21/2006  . Allergic rhinitis, cause unspecified 12/21/2006  . SCIATICA 12/21/2006  . SLEEP APNEA 12/21/2006  . EDEMA 12/21/2006  . OVEREATING  12/21/2006   Past Medical History  Diagnosis Date  . Allergic rhinitis   . Anxiety   . Depression   . Hyperlipidemia   . HTN (hypertension)   . Obesity   . Compulsive overeating   . OA (osteoarthritis) of knee     with injections  . Gout   . Cardiomegaly   . Edema   . Other abnormal glucose   . Sciatica   . Unspecified sleep apnea   . Benign neoplasm of breast 2013  . Personal history of tobacco use, presenting hazards to health   . Diffuse cystic mastopathy 2013  . Sinus infection 2010  . Lump or mass in breast 2012  . Family history of malignant neoplasm of breast    Past Surgical History  Procedure Laterality Date  . Breast lumpectomy      right x1 ('89) left x2 ('70s, '90)  . Colonoscopy  11/02    diverticulosis  . US transvaginal pelvic modified  2000, 2001  . Dexa  3/02    normal  . Sleep study  5/05  . Colonoscopy  9/06    diverticulosis, hemorhoids  . Adenosine cardiolite  1/08    low risk   . Knee replacement Right 9/11    R. Dr. Telford Nab  . Cardiac catheterization  2001    normal per pt  . Total abdominal hysterectomy  fibroids, age of 57  . Appendectomy    . Tonsillectomy    . Eye surgery  2012    cataract  . Total knee arthroplasty Left 2012   History  Substance Use Topics  . Smoking status: Former Smoker -- 0.50 packs/day for 20 years    Types: Cigarettes  . Smokeless tobacco: Not on file     Comment: quit approx. 20 years ago  . Alcohol Use: Yes     Comment: wine occasional   Family History  Problem Relation Age of Onset  . Gout      whole family   . Stroke Father   . Hypertension Mother     (a lot of animosity in their relationship)  . Heart failure Mother   . Prostate cancer Brother   . Hepatitis Brother     Hep C; alcoholism (terminal)  . Breast cancer Sister   . Other Brother     drug addiction   Allergies  Allergen Reactions  . Buspirone Hcl     REACTION: made her sleepy, ? swollen legs  . Lisinopril     REACTION:  dizziness  . Prozac [Fluoxetine Hcl]     sleepy   Current Outpatient Prescriptions on File Prior to Visit  Medication Sig Dispense Refill  . allopurinol (ZYLOPRIM) 100 MG tablet TAKE 1 TABLET BY MOUTH DAILY 30 tablet 5  . amLODipine (NORVASC) 5 MG tablet TAKE 1 TABLET BY MOUTH DAILY 30 tablet 11  . calcium carbonate (OS-CAL) 600 MG TABS Take 600 mg by mouth daily.     . furosemide (LASIX) 40 MG tablet Take 1 tablet (40 mg total) by mouth daily. *Needs to schedule physical appt before future refills are given* 30 tablet 0  . losartan (COZAAR) 50 MG tablet TAKE 1 TABLET BY MOUTH DAILY 30 tablet 3  . metoprolol succinate (TOPROL-XL) 100 MG 24 hr tablet Take 1 tablet (100 mg total) by mouth daily. *Needs to schedule physical appt before future refills are given* 30 tablet 0  . Omega-3 Fatty Acids (FISH OIL PO) Take by mouth.    . potassium chloride SA (K-DUR,KLOR-CON) 20 MEQ tablet TAKE 1 TABLET BY MOUTH DAILY 30 tablet 5   No current facility-administered medications on file prior to visit.    Review of Systems Review of Systems  Constitutional: Negative for fever, appetite change, fatigue and unexpected weight change.  Eyes: Negative for pain and visual disturbance.  Respiratory: Negative for cough and shortness of breath.   Cardiovascular: Negative for cp or palpitations    Gastrointestinal: Negative for nausea, diarrhea and constipation.  Genitourinary: Negative for urgency and frequency.  Skin: Negative for pallor or rash   Neurological: Negative for weakness, light-headedness, numbness and headaches.  Hematological: Negative for adenopathy. Does not bruise/bleed easily.  Psychiatric/Behavioral: pos for dysphoric mood and anxiety, also compulsive overeating         Objective:   Physical Exam  Constitutional: She appears well-developed and well-nourished. No distress.  obese and well appearing   HENT:  Head: Normocephalic and atraumatic.  Mouth/Throat: Oropharynx is clear and  moist.  Eyes: Conjunctivae and EOM are normal. Pupils are equal, round, and reactive to light.  Neck: Normal range of motion. Neck supple. No JVD present. Carotid bruit is not present. No thyromegaly present.  Cardiovascular: Normal rate, regular rhythm, normal heart sounds and intact distal pulses.  Exam reveals no gallop.   Pulmonary/Chest: Effort normal and breath sounds normal. No respiratory distress. She has no wheezes.  She has no rales.  No crackles  Abdominal: Soft. Bowel sounds are normal. She exhibits no distension, no abdominal bruit and no mass. There is no tenderness.  Musculoskeletal: She exhibits no edema or tenderness.  Lymphadenopathy:    She has no cervical adenopathy.  Neurological: She is alert. She has normal reflexes.  Skin: Skin is warm and dry. No rash noted.  Psychiatric: Her speech is normal and behavior is normal. Thought content normal. Her mood appears anxious. Her affect is not blunt, not labile and not inappropriate. She exhibits a depressed mood.  Pt talks freely about stressors  Seems very frustrated           Assessment & Plan:   Problem List Items Addressed This Visit    Adjustment disorder with mixed anxiety and depressed mood    Stress related at times Has tried counseling numerous times  Irritability tends to push people away and causes her to over eat  Disc cog-beh therapy and meditation/mindfullness as things to try      Essential hypertension - Primary    bp in fair control at this time  BP Readings from Last 1 Encounters:  07/25/14 128/74   No changes needed Disc lifstyle change with low sodium diet and exercise  Lab today  Enc wt loss       Relevant Medications   metoprolol succinate (TOPROL-XL) 100 MG 24 hr tablet   losartan (COZAAR) 50 MG tablet   Other Relevant Orders   Comprehensive metabolic panel (Completed)   Fasting hyperglycemia    A1C today  Wt gain noted Disc imp of low glycemic diet and wt loss for DM prevention          Relevant Orders   Hemoglobin A1c (Completed)   Obesity    Discussed how this problem influences overall health and the risks it imposes  Reviewed plan for weight loss with lower calorie diet (via better food choices and also portion control or program like weight watchers) and exercise building up to or more than 30 minutes 5 days per week including some aerobic activity

## 2014-07-25 NOTE — Progress Notes (Signed)
Pre visit review using our clinic review tool, if applicable. No additional management support is needed unless otherwise documented below in the visit note. 

## 2014-07-25 NOTE — Assessment & Plan Note (Signed)
Stress related at times Has tried counseling numerous times  Irritability tends to push people away and causes her to over eat  Disc cog-beh therapy and meditation/mindfullness as things to try

## 2014-07-25 NOTE — Patient Instructions (Signed)
Labs today  Follow up for annual exam in 6 months with labs prior  Keep working on stress reduction (if you want a counseling referral please let me know) Stay active  Keep searching for joy in your life

## 2014-07-28 NOTE — Assessment & Plan Note (Signed)
Discussed how this problem influences overall health and the risks it imposes  Reviewed plan for weight loss with lower calorie diet (via better food choices and also portion control or program like weight watchers) and exercise building up to or more than 30 minutes 5 days per week including some aerobic activity    

## 2014-07-28 NOTE — Assessment & Plan Note (Signed)
bp in fair control at this time  BP Readings from Last 1 Encounters:  07/25/14 128/74   No changes needed Disc lifstyle change with low sodium diet and exercise  Lab today  Enc wt loss

## 2014-07-28 NOTE — Assessment & Plan Note (Signed)
A1C today  Wt gain noted Disc imp of low glycemic diet and wt loss for DM prevention

## 2014-08-27 ENCOUNTER — Other Ambulatory Visit: Payer: Self-pay | Admitting: Family Medicine

## 2014-09-23 ENCOUNTER — Other Ambulatory Visit: Payer: Self-pay | Admitting: Family Medicine

## 2014-10-22 ENCOUNTER — Other Ambulatory Visit: Payer: Self-pay | Admitting: Family Medicine

## 2014-11-27 ENCOUNTER — Other Ambulatory Visit: Payer: Self-pay | Admitting: Family Medicine

## 2014-11-27 NOTE — Telephone Encounter (Signed)
Please refill times 3 , thanks 

## 2014-11-27 NOTE — Telephone Encounter (Signed)
Electronic refill request, I can't tell if we have filled this before, it's not on med list, please advise

## 2014-11-28 NOTE — Telephone Encounter (Signed)
done

## 2014-12-12 LAB — FECAL OCCULT BLOOD, GUAIAC: Fecal Occult Blood: NEGATIVE

## 2014-12-31 ENCOUNTER — Ambulatory Visit (INDEPENDENT_AMBULATORY_CARE_PROVIDER_SITE_OTHER): Payer: Medicare Other | Admitting: Family Medicine

## 2014-12-31 ENCOUNTER — Encounter: Payer: Self-pay | Admitting: Family Medicine

## 2014-12-31 VITALS — BP 136/86 | HR 74 | Temp 98.2°F | Ht 67.0 in | Wt 267.2 lb

## 2014-12-31 DIAGNOSIS — I5032 Chronic diastolic (congestive) heart failure: Secondary | ICD-10-CM

## 2014-12-31 DIAGNOSIS — R7301 Impaired fasting glucose: Secondary | ICD-10-CM | POA: Diagnosis not present

## 2014-12-31 DIAGNOSIS — L304 Erythema intertrigo: Secondary | ICD-10-CM

## 2014-12-31 DIAGNOSIS — E669 Obesity, unspecified: Secondary | ICD-10-CM

## 2014-12-31 DIAGNOSIS — Z23 Encounter for immunization: Secondary | ICD-10-CM

## 2014-12-31 DIAGNOSIS — F502 Bulimia nervosa: Secondary | ICD-10-CM

## 2014-12-31 DIAGNOSIS — F5089 Other specified eating disorder: Secondary | ICD-10-CM

## 2014-12-31 DIAGNOSIS — R601 Generalized edema: Secondary | ICD-10-CM

## 2014-12-31 DIAGNOSIS — I10 Essential (primary) hypertension: Secondary | ICD-10-CM | POA: Diagnosis not present

## 2014-12-31 LAB — CBC WITH DIFFERENTIAL/PLATELET
Basophils Absolute: 0 10*3/uL (ref 0.0–0.1)
Basophils Relative: 0.4 % (ref 0.0–3.0)
Eosinophils Absolute: 0.1 10*3/uL (ref 0.0–0.7)
Eosinophils Relative: 2 % (ref 0.0–5.0)
HCT: 40.5 % (ref 36.0–46.0)
Hemoglobin: 13.1 g/dL (ref 12.0–15.0)
Lymphocytes Relative: 31 % (ref 12.0–46.0)
Lymphs Abs: 1.9 10*3/uL (ref 0.7–4.0)
MCHC: 32.3 g/dL (ref 30.0–36.0)
MCV: 86.1 fl (ref 78.0–100.0)
Monocytes Absolute: 0.5 10*3/uL (ref 0.1–1.0)
Monocytes Relative: 8.8 % (ref 3.0–12.0)
Neutro Abs: 3.6 10*3/uL (ref 1.4–7.7)
Neutrophils Relative %: 57.8 % (ref 43.0–77.0)
Platelets: 220 10*3/uL (ref 150.0–400.0)
RBC: 4.7 Mil/uL (ref 3.87–5.11)
RDW: 15.1 % (ref 11.5–15.5)
WBC: 6.2 10*3/uL (ref 4.0–10.5)

## 2014-12-31 LAB — COMPREHENSIVE METABOLIC PANEL
ALT: 33 U/L (ref 0–35)
AST: 26 U/L (ref 0–37)
Albumin: 4 g/dL (ref 3.5–5.2)
Alkaline Phosphatase: 92 U/L (ref 39–117)
BUN: 16 mg/dL (ref 6–23)
CO2: 29 mEq/L (ref 19–32)
Calcium: 10 mg/dL (ref 8.4–10.5)
Chloride: 104 mEq/L (ref 96–112)
Creatinine, Ser: 0.93 mg/dL (ref 0.40–1.20)
GFR: 75.11 mL/min (ref >60.00)
Glucose, Bld: 97 mg/dL (ref 70–99)
Potassium: 3.6 mEq/L (ref 3.5–5.1)
Sodium: 142 mEq/L (ref 135–145)
Total Bilirubin: 0.4 mg/dL (ref 0.2–1.2)
Total Protein: 6.9 g/dL (ref 6.0–8.3)

## 2014-12-31 LAB — VITAMIN B12: Vitamin B-12: 352 pg/mL (ref 211–911)

## 2014-12-31 LAB — TSH: TSH: 0.64 u[IU]/mL (ref 0.35–4.50)

## 2014-12-31 LAB — HEMOGLOBIN A1C: Hgb A1c MFr Bld: 6.1 % (ref 4.6–6.5)

## 2014-12-31 NOTE — Progress Notes (Signed)
Subjective:    Patient ID: Monica [name], female    DOB: 05/02/1935, 79 y.o.   MRN: [id number]  HPI  Here with several issues   abd and feet feel swollen Folds under her pannus gets irritated (and under breasts) Uses a barrier cream   Stays tired   Lab Results  Component Value Date   HGBA1C 6.1 07/25/2014   Wants to check sugar   Sob easily on exertion   Also interested in getting a lap band    Patient Active Problem List   Diagnosis Date Noted  . Family history of breast cancer 02/28/2014  . Venous stasis dermatitis 02/27/2013  . Diffuse cystic mastopathy 02/11/2013  . Chronic diastolic heart failure (HCC) 12/12/2012    Class: Chronic  . Intertrigo 11/27/2012  . Lump or mass in breast   . Abdominal pain, chronic, right lower quadrant 02/23/2012  . Urge incontinence of urine 09/09/2011  . Rash and nonspecific skin eruption 08/08/2011  . OSTEOARTHRITIS, KNEES, BILATERAL 02/09/2010  . GOUT, UNSPECIFIED 03/20/2008  . Fasting hyperglycemia 02/27/2008  . Hyperlipidemia LDL goal <100 12/21/2006  . Obesity 12/21/2006  . Adjustment disorder with mixed anxiety and depressed mood 12/21/2006  . DEPRESSION 12/21/2006  . Essential hypertension 12/21/2006  . Allergic rhinitis, cause unspecified 12/21/2006  . SCIATICA 12/21/2006  . SLEEP APNEA 12/21/2006  . EDEMA 12/21/2006  . OVEREATING 12/21/2006   Past Medical History  Diagnosis Date  . Allergic rhinitis   . Anxiety   . Depression   . Hyperlipidemia   . HTN (hypertension)   . Obesity   . Compulsive overeating   . OA (osteoarthritis) of knee     with injections  . Gout   . Cardiomegaly   . Edema   . Other abnormal glucose   . Sciatica   . Unspecified sleep apnea   . Benign neoplasm of breast 2013  . Personal history of tobacco use, presenting hazards to health   . Diffuse cystic mastopathy 2013  . Sinus infection 2010  . Lump or mass in breast 2012  . Family history of malignant neoplasm of breast     Past Surgical History  Procedure Laterality Date  . Breast lumpectomy      right x1 ('89) left x2 ('70s, '90)  . Colonoscopy  11/02    diverticulosis  . US transvaginal pelvic modified  2000, 2001  . Dexa  3/02    normal  . Sleep study  5/05  . Colonoscopy  9/06    diverticulosis, hemorhoids  . Adenosine cardiolite  1/08    low risk   . Knee replacement Right 9/11    R. Dr. Telford Nab  . Cardiac catheterization  2001    normal per pt  . Total abdominal hysterectomy      fibroids, age of 51  . Appendectomy    . Tonsillectomy    . Eye surgery  2012    cataract  . Total knee arthroplasty Left 2012   Social History  Substance Use Topics  . Smoking status: Former Smoker -- 0.50 packs/day for 20 years    Types: Cigarettes  . Smokeless tobacco: None     Comment: quit approx. 20 years ago  . Alcohol Use: 0.0 oz/week    0 Standard drinks or equivalent per week     Comment: wine occasional   Family History  Problem Relation Age of Onset  . Gout      whole family   . Stroke Father   .  Hypertension Mother     (a lot of animosity in their relationship)  . Heart failure Mother   . Prostate cancer Brother   . Hepatitis Brother     Hep C; alcoholism (terminal)  . Breast cancer Sister   . Other Brother     drug addiction   Allergies  Allergen Reactions  . Buspirone Hcl     REACTION: made her sleepy, ? swollen legs  . Lisinopril     REACTION: dizziness  . Prozac [Fluoxetine Hcl]     sleepy   Current Outpatient Prescriptions on File Prior to Visit  Medication Sig Dispense Refill  . ACCU-CHEK AVIVA PLUS test strip CHECK GLUCOSE WITH EACH MEAL AND AT BEDTIME AS DIRECTED 100 each 3  . allopurinol (ZYLOPRIM) 100 MG tablet Take 1 tablet (100 mg total) by mouth daily. 30 tablet 11  . amLODipine (NORVASC) 5 MG tablet TAKE 1 TABLET BY MOUTH DAILY 30 tablet 11  . calcium carbonate (OS-CAL) 600 MG TABS Take 600 mg by mouth daily.     . furosemide (LASIX) 40 MG tablet TAKE 1  TABLET BY MOUTH DAILY 30 tablet 5  . losartan (COZAAR) 50 MG tablet Take 1 tablet (50 mg total) by mouth daily. 30 tablet 11  . MAGNESIUM CARBONATE PO Take 250 mg by mouth daily.    . metoprolol succinate (TOPROL-XL) 100 MG 24 hr tablet Take 1 tablet (100 mg total) by mouth daily. *Needs to schedule physical appt before future refills are given* 30 tablet 11  . Omega-3 Fatty Acids (FISH OIL PO) Take by mouth.    . potassium chloride SA (K-DUR,KLOR-CON) 20 MEQ tablet TAKE 1 TABLET BY MOUTH DAILY 30 tablet 3   No current facility-administered medications on file prior to visit.    Review of Systems Review of Systems  Constitutional: Negative for fever, appetite change,  and unexpected weight change. pos for gen fatigue, pos for wt gain from excess calories  Eyes: Negative for pain and visual disturbance.  Respiratory: Negative for cough and pos for baseline exertional  shortness of breath.   Cardiovascular: Negative for cp or palpitations    Gastrointestinal: Negative for nausea, diarrhea and constipation. pos for abd bloating  Genitourinary: Negative for urgency and frequency.  Skin: Negative for pallor or rash  pos for itching and sweating under breasts and pannus  Neurological: Negative for weakness, light-headedness, numbness and headaches.  Hematological: Negative for adenopathy. Does not bruise/bleed easily.  Psychiatric/Behavioral: pos for stressors and anx/dep symptoms w/o SI, pos for emotional eating that is out of control        Objective:   Physical Exam  Constitutional: She appears well-developed and well-nourished. No distress.  obese and well appearing   HENT:  Head: Normocephalic and atraumatic.  Mouth/Throat: Oropharynx is clear and moist.  Eyes: Conjunctivae and EOM are normal. Pupils are equal, round, and reactive to light.  Neck: Normal range of motion. Neck supple. No JVD present. Carotid bruit is not present. No thyromegaly present.  Cardiovascular: Normal rate,  regular rhythm, normal heart sounds and intact distal pulses.  Exam reveals no gallop.   Pulmonary/Chest: Effort normal and breath sounds normal. No respiratory distress. She has no wheezes. She has no rales.  No crackles  Abdominal: Soft. Bowel sounds are normal. She exhibits no distension, no pulsatile liver, no fluid wave, no abdominal bruit, no ascites, no pulsatile midline mass and no mass. There is no hepatosplenomegaly. There is no tenderness. There is no rigidity, no rebound,  no guarding, no CVA tenderness, no tenderness at McBurney's point and negative Murphy's sign.  Musculoskeletal: She exhibits edema. She exhibits no tenderness.  Lymphedema in legs -no pitting    Lymphadenopathy:    She has no cervical adenopathy.  Neurological: She is alert. She has normal reflexes.  Skin: Skin is warm and dry. No rash noted.  Pt has powder on areas of intertrigo under breasts and panus   Some SKs in those areas  No erythema or skin breakdown   Psychiatric: She has a normal mood and affect.          Assessment & Plan:   Problem List Items Addressed This Visit      Cardiovascular and Mediastinum   Chronic diastolic heart failure (HCC)    Pt c/o of sob from deconditioning -without change  No rales on pulm exam and edema is not pitting No symptoms of angina  Will continue to follow       Essential hypertension - Primary    bp in fair control at this time  BP Readings from Last 1 Encounters:  12/31/14 136/86   No changes needed Disc lifstyle change with low sodium diet and exercise  Lab today  Enc wt loss       Relevant Orders   CBC with Differential/Platelet (Completed)   Comprehensive metabolic panel (Completed)   TSH (Completed)   Vitamin B12 (Completed)     Endocrine   Fasting hyperglycemia    A1C today Diet has not been optimal  Stressed importance of low glycemic diet and wt loss to prevent DM2 She voiced understanding and is struggling with emotional eating       Relevant Orders   Hemoglobin A1c (Completed)     Musculoskeletal and Integument   Intertrigo    No signs of yeast today under breasts or pannus  Pt is doing a good job keeping areas clean and dry Enc use of antifungal powder  Also enc wt loss         Other   Compulsive overeating    With ongoing wt gain and health problems resulting from that  Pt has seen counselors and not ready to return Disc a plan to list healthy coping mechanism Urged to return to counseling Urged to consider a supportive group program       EDEMA    Pt c/o pedal and abd swelling No change in exam and no pitting This may be rel to weight  Also venous insuff in LEs supp hose may help  Disc sodium avoidance and need for wt loss  Lab today with f/u to review       Obesity    In pt with very difficult to control compulsive/emotional overeating (and poor self esteem) Enc her to consider counseling again  Also plan to make a list of healthy coping mechanisms for stress that could replace food in those circumstances   Discussed how this problem influences overall health and the risks it imposes (incl DM/joint pain/ heart problems/hypoventilation syndrome among others) Reviewed plan for weight loss with lower calorie diet (via better food choices and also portion control or program like weight watchers) and exercise building up to or more than 30 minutes 5 days per week including some aerobic activity    F/u soon to track progress and discuss labs         Other Visit Diagnoses    Need for influenza vaccination        Relevant Orders  Flu Vaccine QUAD 36+ mos PF IM (Fluarix & Fluzone Quad PF) (Completed)

## 2014-12-31 NOTE — Patient Instructions (Addendum)
A lot of your health problem go back to obesity  Please try to work on a list of things you can do to soothe yourself instead of eating (along with intrusive negative thoughts) A negative attitude is your enemy- counseling will help if you go in ready to work and ready to have an open mind  Labs today for glucose/swelling/fatigue Follow up in about a month  Keep areas under breasts and abdomen clean and dry -powder and barrier creams   An author named Carleene Mains Zin is a Microbiologist for learning about meditation

## 2014-12-31 NOTE — Progress Notes (Signed)
Pre visit review using our clinic review tool, if applicable. No additional management support is needed unless otherwise documented below in the visit note. 

## 2015-01-01 DIAGNOSIS — F5089 Other specified eating disorder: Secondary | ICD-10-CM

## 2015-01-01 HISTORY — DX: Other specified eating disorder: F50.89

## 2015-01-01 NOTE — Assessment & Plan Note (Signed)
No signs of yeast today under breasts or pannus  Pt is doing a good job keeping areas clean and dry Enc use of antifungal powder  Also enc wt loss

## 2015-01-01 NOTE — Assessment & Plan Note (Signed)
bp in fair control at this time  BP Readings from Last 1 Encounters:  12/31/14 136/86   No changes needed Disc lifstyle change with low sodium diet and exercise  Lab today  Enc wt loss

## 2015-01-01 NOTE — Assessment & Plan Note (Signed)
With ongoing wt gain and health problems resulting from that  Pt has seen counselors and not ready to return Disc a plan to list healthy coping mechanism Urged to return to counseling Urged to consider a supportive group program

## 2015-01-01 NOTE — Assessment & Plan Note (Signed)
A1C today Diet has not been optimal  Stressed importance of low glycemic diet and wt loss to prevent DM2 She voiced understanding and is struggling with emotional eating

## 2015-01-01 NOTE — Assessment & Plan Note (Signed)
In pt with very difficult to control compulsive/emotional overeating (and poor self esteem) Enc her to consider counseling again  Also plan to make a list of healthy coping mechanisms for stress that could replace food in those circumstances   Discussed how this problem influences overall health and the risks it imposes (incl DM/joint pain/ heart problems/hypoventilation syndrome among others) Reviewed plan for weight loss with lower calorie diet (via better food choices and also portion control or program like weight watchers) and exercise building up to or more than 30 minutes 5 days per week including some aerobic activity    F/u soon to track progress and discuss labs

## 2015-01-01 NOTE — Assessment & Plan Note (Signed)
Pt c/o of sob from deconditioning -without change  No rales on pulm exam and edema is not pitting No symptoms of angina  Will continue to follow

## 2015-01-01 NOTE — Assessment & Plan Note (Signed)
Pt c/o pedal and abd swelling No change in exam and no pitting This may be rel to weight  Also venous insuff in LEs supp hose may help  Disc sodium avoidance and need for wt loss  Lab today with f/u to review

## 2015-01-30 ENCOUNTER — Encounter: Payer: Self-pay | Admitting: Family Medicine

## 2015-01-30 ENCOUNTER — Ambulatory Visit (INDEPENDENT_AMBULATORY_CARE_PROVIDER_SITE_OTHER): Payer: Medicare Other | Admitting: Family Medicine

## 2015-01-30 VITALS — BP 124/78 | HR 66 | Temp 98.5°F | Ht 67.0 in | Wt 267.2 lb

## 2015-01-30 DIAGNOSIS — F418 Other specified anxiety disorders: Secondary | ICD-10-CM

## 2015-01-30 DIAGNOSIS — I10 Essential (primary) hypertension: Secondary | ICD-10-CM

## 2015-01-30 DIAGNOSIS — R7301 Impaired fasting glucose: Secondary | ICD-10-CM | POA: Diagnosis not present

## 2015-01-30 MED ORDER — FLUOXETINE HCL 20 MG PO TABS
20.0000 mg | ORAL_TABLET | Freq: Every day | ORAL | Status: DC
Start: 2015-01-30 — End: 2015-02-11

## 2015-01-30 NOTE — Progress Notes (Signed)
Pre visit review using our clinic review tool, if applicable. No additional management support is needed unless otherwise documented below in the visit note. 

## 2015-01-30 NOTE — Patient Instructions (Addendum)
Start prozac for mood - this may help your irritability and anxiety  Start with 1/2 pill daily for a week and then increase to one pill (20 mg)  If any intolerable side effects or you feel worse -please let me know Try to stay active and social in "small doses" - and try to focus on what gives you joy  We will see you later this month

## 2015-01-30 NOTE — Progress Notes (Signed)
Subjective:    Patient ID: Monica [name], female    DOB: 1935/12/02, 79 y.o.   MRN: [id number]  HPI Here for f/u of chronic health problems  More anxious lately  Gets upset "too easily"- irritable  Notices in the way she responds to her daughter who is trying to be helpful (she resents it) Not talking to sister and brother  Has always had trouble with relationships  Thought about going back to counseling - does not think it will be worth it (her personality is too strong and she is quick to disagree)  Would rather stay by herself  Has never seen a psychiatrist   We have tried buspar and prozac in the past  They made her sleepy  In retrospect she thinks she needs to try prozac again  (was prev worried about the stigma)   She gets joy from working in the yard /planting flowers Likes to [employer] (for the sake of cooking)  Also decided to start playing the piano again   Still emotionally eats   Back of her thighs stiffen up and hurt Uses a walker at times    Wt is stable with bmi of 98 Morbidly obese   Hyperglycemia Lab Results  Component Value Date   HGBA1C 6.1 12/31/2014  she misplaced her glucometer    bp is stable today  No cp or palpitations or headaches or edema  No side effects to medicines  BP Readings from Last 3 Encounters:  01/30/15 124/78  12/31/14 136/86  07/25/14 128/74     Office Visit on 12/31/2014  Component Date Value Ref Range Status  . WBC 12/31/2014 6.2  4.0 - 10.5 K/uL Final  . RBC 12/31/2014 4.70  3.87 - 5.11 Mil/uL Final  . Hemoglobin 12/31/2014 13.1  12.0 - 15.0 g/dL Final  . HCT 12/31/2014 40.5  36.0 - 46.0 % Final  . MCV 12/31/2014 86.1  78.0 - 100.0 fl Final  . MCHC 12/31/2014 32.3  30.0 - 36.0 g/dL Final  . RDW 12/31/2014 15.1  11.5 - 15.5 % Final  . Platelets 12/31/2014 220.0  150.0 - 400.0 K/uL Final  . Neutrophils Relative % 12/31/2014 57.8  43.0 - 77.0 % Final  . Lymphocytes Relative 12/31/2014 31.0  12.0 - 46.0 % Final  .  Monocytes Relative 12/31/2014 8.8  3.0 - 12.0 % Final  . Eosinophils Relative 12/31/2014 2.0  0.0 - 5.0 % Final  . Basophils Relative 12/31/2014 0.4  0.0 - 3.0 % Final  . Neutro Abs 12/31/2014 3.6  1.4 - 7.7 K/uL Final  . Lymphs Abs 12/31/2014 1.9  0.7 - 4.0 K/uL Final  . Monocytes Absolute 12/31/2014 0.5  0.1 - 1.0 K/uL Final  . Eosinophils Absolute 12/31/2014 0.1  0.0 - 0.7 K/uL Final  . Basophils Absolute 12/31/2014 0.0  0.0 - 0.1 K/uL Final  . Sodium 12/31/2014 142  135 - 145 mEq/L Final  . Potassium 12/31/2014 3.6  3.5 - 5.1 mEq/L Final  . Chloride 12/31/2014 104  96 - 112 mEq/L Final  . CO2 12/31/2014 29  19 - 32 mEq/L Final  . Glucose, Bld 12/31/2014 97  70 - 99 mg/dL Final  . BUN 12/31/2014 16  6 - 23 mg/dL Final  . Creatinine, Ser 12/31/2014 0.93  0.40 - 1.20 mg/dL Final  . Total Bilirubin 12/31/2014 0.4  0.2 - 1.2 mg/dL Final  . Alkaline Phosphatase 12/31/2014 92  39 - 117 U/L Final  . AST 12/31/2014 26  0 - 37  U/L Final  . ALT 12/31/2014 33  0 - 35 U/L Final  . Total Protein 12/31/2014 6.9  6.0 - 8.3 g/dL Final  . Albumin 12/31/2014 4.0  3.5 - 5.2 g/dL Final  . Calcium 12/31/2014 10.0  8.4 - 10.5 mg/dL Final  . GFR 12/31/2014 75.11  >60.00 mL/min Final  . TSH 12/31/2014 0.64  0.35 - 4.50 uIU/mL Final  . Hgb A1c MFr Bld 12/31/2014 6.1  4.6 - 6.5 % Final   Glycemic Control Guidelines for People with Diabetes:Non Diabetic:  <6%Goal of Therapy: <7%Additional Action Suggested:  >8%   . Vitamin B-12 12/31/2014 352  211 - 911 pg/mL Final     Patient Active Problem List   Diagnosis Date Noted  . Compulsive overeating 01/01/2015  . Family history of breast cancer 02/28/2014  . Venous stasis dermatitis 02/27/2013  . Diffuse cystic mastopathy 02/11/2013  . Chronic diastolic heart failure (HCC) 12/12/2012    Class: Chronic  . Intertrigo 11/27/2012  . Lump or mass in breast   . Abdominal pain, chronic, right lower quadrant 02/23/2012  . Urge incontinence of urine 09/09/2011  .  Rash and nonspecific skin eruption 08/08/2011  . OSTEOARTHRITIS, KNEES, BILATERAL 02/09/2010  . GOUT, UNSPECIFIED 03/20/2008  . Fasting hyperglycemia 02/27/2008  . Hyperlipidemia LDL goal <100 12/21/2006  . Morbid obesity ([place]) 12/21/2006  . Adjustment disorder with mixed anxiety and depressed mood 12/21/2006  . DEPRESSION 12/21/2006  . Essential hypertension 12/21/2006  . Allergic rhinitis, cause unspecified 12/21/2006  . SCIATICA 12/21/2006  . SLEEP APNEA 12/21/2006  . EDEMA 12/21/2006  . OVEREATING 12/21/2006   Past Medical History  Diagnosis Date  . Allergic rhinitis   . Anxiety   . Depression   . Hyperlipidemia   . HTN (hypertension)   . Obesity   . Compulsive overeating   . OA (osteoarthritis) of knee     with injections  . Gout   . Cardiomegaly   . Edema   . Other abnormal glucose   . Sciatica   . Unspecified sleep apnea   . Benign neoplasm of breast 2013  . Personal history of tobacco use, presenting hazards to health   . Diffuse cystic mastopathy 2013  . Sinus infection 2010  . Lump or mass in breast 2012  . Family history of malignant neoplasm of breast    Past Surgical History  Procedure Laterality Date  . Breast lumpectomy      right x1 ('89) left x2 ('70s, '90)  . Colonoscopy  11/02    diverticulosis  . US transvaginal pelvic modified  2000, 2001  . Dexa  3/02    normal  . Sleep study  5/05  . Colonoscopy  9/06    diverticulosis, hemorhoids  . Adenosine cardiolite  1/08    low risk   . Knee replacement Right 9/11    R. Dr. Telford Nab  . Cardiac catheterization  2001    normal per pt  . Total abdominal hysterectomy      fibroids, age of 62  . Appendectomy    . Tonsillectomy    . Eye surgery  2012    cataract  . Total knee arthroplasty Left 2012   Social History  Substance Use Topics  . Smoking status: Former Smoker -- 0.50 packs/day for 20 years    Types: Cigarettes  . Smokeless tobacco: None     Comment: quit approx. 20 years ago  .  Alcohol Use: 0.0 oz/week    0 Standard drinks or  equivalent per week     Comment: wine occasional   Family History  Problem Relation Age of Onset  . Gout      whole family   . Stroke Father   . Hypertension Mother     (a lot of animosity in their relationship)  . Heart failure Mother   . Prostate cancer Brother   . Hepatitis Brother     Hep C; alcoholism (terminal)  . Breast cancer Sister   . Other Brother     drug addiction   Allergies  Allergen Reactions  . Buspirone Hcl     REACTION: made her sleepy, ? swollen legs  . Lisinopril     REACTION: dizziness  . Prozac [Fluoxetine Hcl]     sleepy   Current Outpatient Prescriptions on File Prior to Visit  Medication Sig Dispense Refill  . ACCU-CHEK AVIVA PLUS test strip CHECK GLUCOSE WITH EACH MEAL AND AT BEDTIME AS DIRECTED 100 each 3  . allopurinol (ZYLOPRIM) 100 MG tablet Take 1 tablet (100 mg total) by mouth daily. 30 tablet 11  . amLODipine (NORVASC) 5 MG tablet TAKE 1 TABLET BY MOUTH DAILY 30 tablet 11  . calcium carbonate (OS-CAL) 600 MG TABS Take 600 mg by mouth daily.     . furosemide (LASIX) 40 MG tablet TAKE 1 TABLET BY MOUTH DAILY 30 tablet 5  . losartan (COZAAR) 50 MG tablet Take 1 tablet (50 mg total) by mouth daily. 30 tablet 11  . MAGNESIUM CARBONATE PO Take 250 mg by mouth daily.    . metoprolol succinate (TOPROL-XL) 100 MG 24 hr tablet Take 1 tablet (100 mg total) by mouth daily. *Needs to schedule physical appt before future refills are given* 30 tablet 11  . Omega-3 Fatty Acids (FISH OIL PO) Take by mouth.    . potassium chloride SA (K-DUR,KLOR-CON) 20 MEQ tablet TAKE 1 TABLET BY MOUTH DAILY 30 tablet 3   No current facility-administered medications on file prior to visit.    Review of Systems Review of Systems  Constitutional: Negative for fever, appetite change, fatigue and unexpected weight change.  Eyes: Negative for pain and visual disturbance.  Respiratory: Negative for cough and shortness of  breath.   Cardiovascular: Negative for cp or palpitations    Gastrointestinal: Negative for nausea, diarrhea and constipation.  Genitourinary: Negative for urgency and frequency.  Skin: Negative for pallor or rash   MSK pos for leg stiffness upon getting up  Neurological: Negative for weakness, light-headedness, numbness and headaches.  Hematological: Negative for adenopathy. Does not bruise/bleed easily.  Psychiatric/Behavioral: pos for symptoms of anxiety and depression without SI         Objective:   Physical Exam  Constitutional: She appears well-developed and well-nourished. No distress.  Morbidly obese and well appearing but depressed   HENT:  Head: Normocephalic and atraumatic.  Mouth/Throat: Oropharynx is clear and moist.  Eyes: Conjunctivae and EOM are normal. Pupils are equal, round, and reactive to light.  Neck: Normal range of motion. Neck supple. No JVD present. Carotid bruit is not present. No thyromegaly present.  Cardiovascular: Normal rate, regular rhythm, normal heart sounds and intact distal pulses.  Exam reveals no gallop.   Pulmonary/Chest: Effort normal and breath sounds normal. No respiratory distress. She has no wheezes. She has no rales.  No crackles  Abdominal: Soft. Bowel sounds are normal. She exhibits no distension, no abdominal bruit and no mass. There is no tenderness.  Musculoskeletal: She exhibits no edema.  Lymphadenopathy:  She has no cervical adenopathy.  Neurological: She is alert. She has normal reflexes.  Skin: Skin is warm and dry. No rash noted.  Psychiatric: Her speech is normal. Thought content normal. Her mood appears anxious. Her affect is not blunt, not labile and not inappropriate. She is not agitated, not slowed and not withdrawn. Thought content is not paranoid and not delusional. She exhibits a depressed mood. She expresses no homicidal and no suicidal ideation.  Very talkative Anxious to disc her stressors and her mood  Tearful at  times           Assessment & Plan:   Problem List Items Addressed This Visit      Cardiovascular and Mediastinum   Essential hypertension    bp in fair control at this time  BP Readings from Last 1 Encounters:  01/30/15 124/78   No changes needed Disc lifstyle change with low sodium diet and exercise          Endocrine   Fasting hyperglycemia    Lab Results  Component Value Date   HGBA1C 6.1 12/31/2014   Overall stable  Stressed importance of wt loss and low glycemic diet to prevent DM        Other   Depression with anxiety - Primary    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 This causes significant depression and anxiety  She declines counseling again Reviewed stressors/ coping techniques/symptoms/ support sources/ tx options and side effects in detail today Will try prozac again 10 mg -titrate to 20  Discussed expectations of SSRI medication including time to effectiveness and mechanism of action, also poss of side effects (early and late)- including mental fuzziness, weight or appetite change, nausea and poss of worse dep or anxiety (even suicidal thoughts)  Pt voiced understanding and will stop med and update if this occurs   Will update F/u planned  >25 minutes spent in face to face time with patient, >50% spent in counselling or coordination of care       Morbid obesity ([place])    Discussed how this problem influences overall health and the risks it imposes  Reviewed plan for weight loss with lower calorie diet (via better food choices and also portion control or program like weight watchers) and exercise building up to or more than 30 minutes 5 days per week including some aerobic activity   Pt continues to struggle with mental health issues and compulsive overeating -but declines counseling (which she has  done)

## 2015-02-01 NOTE — Assessment & Plan Note (Signed)
Discussed how this problem influences overall health and the risks it imposes  Reviewed plan for weight loss with lower calorie diet (via better food choices and also portion control or program like weight watchers) and exercise building up to or more than 30 minutes 5 days per week including some aerobic activity   Pt continues to struggle with mental health issues and compulsive overeating -but declines counseling (which she has done)

## 2015-02-01 NOTE — Assessment & Plan Note (Addendum)
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 This causes significant depression and anxiety  She declines counseling again Reviewed stressors/ coping techniques/symptoms/ support sources/ tx options and side effects in detail today Will try prozac again 10 mg -titrate to 20  Discussed expectations of SSRI medication including time to effectiveness and mechanism of action, also poss of side effects (early and late)- including mental fuzziness, weight or appetite change, nausea and poss of worse dep or anxiety (even suicidal thoughts)  Pt voiced understanding and will stop med and update if this occurs   Will update F/u planned  >25 minutes spent in face to face time with patient, >50% spent in counselling or coordination of care

## 2015-02-01 NOTE — Assessment & Plan Note (Signed)
bp in fair control at this time  BP Readings from Last 1 Encounters:  01/30/15 124/78   No changes needed Disc lifstyle change with low sodium diet and exercise

## 2015-02-01 NOTE — Assessment & Plan Note (Signed)
Lab Results  Component Value Date   HGBA1C 6.1 12/31/2014   Overall stable  Stressed importance of wt loss and low glycemic diet to prevent DM

## 2015-02-03 ENCOUNTER — Other Ambulatory Visit: Payer: Self-pay

## 2015-02-03 MED ORDER — ACCU-CHEK SOFTCLIX LANCETS MISC: Status: DC

## 2015-02-03 NOTE — Telephone Encounter (Signed)
Pt request lancets to [facility]. Refill per protocol.last seen 01/30/15.

## 2015-02-05 ENCOUNTER — Telehealth: Payer: Self-pay | Admitting: Family Medicine

## 2015-02-05 DIAGNOSIS — R7301 Impaired fasting glucose: Secondary | ICD-10-CM

## 2015-02-05 DIAGNOSIS — E785 Hyperlipidemia, unspecified: Secondary | ICD-10-CM

## 2015-02-05 DIAGNOSIS — I10 Essential (primary) hypertension: Secondary | ICD-10-CM

## 2015-02-05 NOTE — Telephone Encounter (Signed)
-----   Message from Ellamae Sia sent at 01/28/2015  5:01 PM EST ----- Regarding: lab orders for Friday, 12.16.16 Patient is scheduled for CPX labs, please order future labs, Thanks , Karna Christmas

## 2015-02-06 ENCOUNTER — Other Ambulatory Visit (INDEPENDENT_AMBULATORY_CARE_PROVIDER_SITE_OTHER): Payer: Medicare Other

## 2015-02-06 DIAGNOSIS — I10 Essential (primary) hypertension: Secondary | ICD-10-CM

## 2015-02-06 DIAGNOSIS — E785 Hyperlipidemia, unspecified: Secondary | ICD-10-CM

## 2015-02-06 LAB — LIPID PANEL
Cholesterol: 187 mg/dL (ref 0–200)
HDL: 41.4 mg/dL (ref >39.00)
LDL Cholesterol: 126 mg/dL — ABNORMAL HIGH (ref 0–99)
NonHDL: 145.2
Total CHOL/HDL Ratio: 5
Triglycerides: 96 mg/dL (ref 0.0–149.0)
VLDL: 19.2 mg/dL (ref 0.0–40.0)

## 2015-02-06 LAB — CBC WITH DIFFERENTIAL/PLATELET
Basophils Absolute: 0 10*3/uL (ref 0.0–0.1)
Basophils Relative: 0.6 % (ref 0.0–3.0)
Eosinophils Absolute: 0.1 10*3/uL (ref 0.0–0.7)
Eosinophils Relative: 2.1 % (ref 0.0–5.0)
HCT: 41.5 % (ref 36.0–46.0)
Hemoglobin: 13.3 g/dL (ref 12.0–15.0)
Lymphocytes Relative: 32.4 % (ref 12.0–46.0)
Lymphs Abs: 2.2 10*3/uL (ref 0.7–4.0)
MCHC: 32.1 g/dL (ref 30.0–36.0)
MCV: 87 fl (ref 78.0–100.0)
Monocytes Absolute: 0.5 10*3/uL (ref 0.1–1.0)
Monocytes Relative: 7.7 % (ref 3.0–12.0)
Neutro Abs: 3.8 10*3/uL (ref 1.4–7.7)
Neutrophils Relative %: 57.2 % (ref 43.0–77.0)
Platelets: 236 10*3/uL (ref 150.0–400.0)
RBC: 4.77 Mil/uL (ref 3.87–5.11)
RDW: 15.2 % (ref 11.5–15.5)
WBC: 6.7 10*3/uL (ref 4.0–10.5)

## 2015-02-06 LAB — COMPREHENSIVE METABOLIC PANEL
ALT: 39 U/L — ABNORMAL HIGH (ref 0–35)
AST: 31 U/L (ref 0–37)
Albumin: 3.9 g/dL (ref 3.5–5.2)
Alkaline Phosphatase: 88 U/L (ref 39–117)
BUN: 16 mg/dL (ref 6–23)
CO2: 30 mEq/L (ref 19–32)
Calcium: 9.5 mg/dL (ref 8.4–10.5)
Chloride: 105 mEq/L (ref 96–112)
Creatinine, Ser: 0.75 mg/dL (ref 0.40–1.20)
GFR: 96.25 mL/min (ref >60.00)
Glucose, Bld: 109 mg/dL — ABNORMAL HIGH (ref 70–99)
Potassium: 3.6 mEq/L (ref 3.5–5.1)
Sodium: 143 mEq/L (ref 135–145)
Total Bilirubin: 0.5 mg/dL (ref 0.2–1.2)
Total Protein: 7.1 g/dL (ref 6.0–8.3)

## 2015-02-06 LAB — TSH: TSH: 1.71 u[IU]/mL (ref 0.35–4.50)

## 2015-02-11 ENCOUNTER — Ambulatory Visit (INDEPENDENT_AMBULATORY_CARE_PROVIDER_SITE_OTHER): Payer: Medicare Other | Admitting: Family Medicine

## 2015-02-11 ENCOUNTER — Encounter: Payer: Self-pay | Admitting: Family Medicine

## 2015-02-11 VITALS — BP 128/76 | HR 70 | Temp 97.9°F | Ht 65.5 in | Wt 266.8 lb

## 2015-02-11 DIAGNOSIS — I10 Essential (primary) hypertension: Secondary | ICD-10-CM | POA: Diagnosis not present

## 2015-02-11 DIAGNOSIS — E2839 Other primary ovarian failure: Secondary | ICD-10-CM

## 2015-02-11 DIAGNOSIS — Z23 Encounter for immunization: Secondary | ICD-10-CM | POA: Diagnosis not present

## 2015-02-11 DIAGNOSIS — R7301 Impaired fasting glucose: Secondary | ICD-10-CM

## 2015-02-11 DIAGNOSIS — F418 Other specified anxiety disorders: Secondary | ICD-10-CM

## 2015-02-11 DIAGNOSIS — E785 Hyperlipidemia, unspecified: Secondary | ICD-10-CM

## 2015-02-11 DIAGNOSIS — Z0000 Encounter for general adult medical examination without abnormal findings: Secondary | ICD-10-CM | POA: Diagnosis not present

## 2015-02-11 MED ORDER — FLUOXETINE HCL 10 MG PO TABS: 10.0000 mg | ORAL_TABLET | Freq: Every day | ORAL | Status: DC

## 2015-02-11 NOTE — Patient Instructions (Signed)
Look at the handout I gave you on a tetanus shot and see where you can get one  prevnar vaccine today Please work on an advance directive ( living will and power of attorney)- see the blue booklet  Stop at check out for referral for bone density test  Work on cholesterol (Avoid red meat/ fried foods/ egg yolks/ fatty breakfast meats/ butter, cheese and high fat dairy/ and shellfish)  Also watch sugar in diet  Also keep working on weight loss  Continue prozac at 10 mg daily  Follow up with me in 6 months with labs prior

## 2015-02-11 NOTE — Progress Notes (Signed)
Subjective:    Patient ID: Monica [name], female    DOB: 30-Aug-1935, 79 y.o.   MRN: [id number]  HPI Here for annual medicare wellness visit as well as chronic/acute medical problems as well as annual preventative exam  I have personally reviewed the Medicare Annual Wellness questionnaire and have noted 1. The patient's medical and social history 2. Their use of alcohol, tobacco or illicit drugs 3. Their current medications and supplements 4. The patient's functional ability including ADL's, fall risks, home safety risks and hearing or visual             impairment. 5. Diet and physical activities 6. Evidence for depression or mood disorders  The patients weight, height, BMI have been recorded in the chart and visual acuity is per eye clinic.  I have made referrals, counseling and provided education to the patient based review of the above and I have provided the pt with a written personalized care plan for preventive services. Reviewed and updated provider list, see scanned forms.  See scanned forms.  Routine anticipatory guidance given to patient.  See health maintenance. Colon cancer screening - Dr [name] gave her ifob last and it was ok - about 2 mo ago  - (was on 10 year recall and then ceased doing that after 57) Breast cancer screening -is due/ has it scheduled next week and then goes back to Dr Jamal Collin for an exam after that  Self breast exam-no changes /does not check regularly however  Flu vaccine 11/16 Tetanus vaccine 2/97 Pneumovax 10/15 , is due for prevnar  Zoster vaccine 8/14 dexa -has never had one -would like to do at [facility], no falls or fractures  Advance directive-wants to work on that again- given materials  Cognitive function addressed- see scanned forms- and if abnormal then additional documentation follows. - thinks it is as good as average,  occ walks into a room and forgets why briefly  PMH and SH reviewed  Meds, vitals, and allergies reviewed.   ROS:  See HPI.  Otherwise negative.    Wt is down 2 lb  Is working on it   bp is stable today  No cp or palpitations or headaches or edema  No side effects to medicines  BP Readings from Last 3 Encounters:  02/11/15 128/76  01/30/15 124/78  12/31/14 136/86     Hyperglycemia  Lab Results  Component Value Date   HGBA1C 6.1 12/31/2014  stable  Working on cutting down sugar in her diet   Hyperlipidemia Lab Results  Component Value Date   CHOL 187 02/06/2015   CHOL 172 11/26/2013   CHOL 200 02/25/2013   Lab Results  Component Value Date   HDL 41.40 02/06/2015   HDL 39.20 11/26/2013   HDL 39.90 02/25/2013   Lab Results  Component Value Date   LDLCALC 126* 02/06/2015   LDLCALC 114* 11/26/2013   LDLCALC 138* 02/25/2013   Lab Results  Component Value Date   TRIG 96.0 02/06/2015   TRIG 93.0 11/26/2013   TRIG 112.0 02/25/2013   Lab Results  Component Value Date   CHOLHDL 5 02/06/2015   CHOLHDL 4 11/26/2013   CHOLHDL 5 02/25/2013   Lab Results  Component Value Date   LDLDIRECT 131.8 12/09/2009   LDLDIRECT 135.4 06/16/2009   LDLDIRECT 137.4 12/10/2008   eats a lot of fatty foods   Started on prozac for depression/anxiety at last visit  She feels better - but thinks it may blunt her emotions too much  Thinks she will stick with it  Thinks she will try to take 10 mg - and that is a good compromise   Patient Active Problem List   Diagnosis Date Noted  . Estrogen deficiency 02/11/2015  . Medicare annual wellness visit, initial 02/11/2015  . Routine general medical examination at a health care facility 02/11/2015  . Compulsive overeating 01/01/2015  . Family history of breast cancer 02/28/2014  . Venous stasis dermatitis 02/27/2013  . Diffuse cystic mastopathy 02/11/2013  . Chronic diastolic heart failure (HCC) 12/12/2012    Class: Chronic  . Intertrigo 11/27/2012  . Lump or mass in breast   . Abdominal pain, chronic, right lower quadrant 02/23/2012  . Urge  incontinence of urine 09/09/2011  . Rash and nonspecific skin eruption 08/08/2011  . OSTEOARTHRITIS, KNEES, BILATERAL 02/09/2010  . GOUT, UNSPECIFIED 03/20/2008  . Fasting hyperglycemia 02/27/2008  . Hyperlipidemia LDL goal <100 12/21/2006  . Morbid obesity ([place]) 12/21/2006  . Adjustment disorder with mixed anxiety and depressed mood 12/21/2006  . Depression with anxiety 12/21/2006  . Essential hypertension 12/21/2006  . Allergic rhinitis, cause unspecified 12/21/2006  . SCIATICA 12/21/2006  . SLEEP APNEA 12/21/2006  . EDEMA 12/21/2006  . OVEREATING 12/21/2006   Past Medical History  Diagnosis Date  . Allergic rhinitis   . Anxiety   . Depression   . Hyperlipidemia   . HTN (hypertension)   . Obesity   . Compulsive overeating   . OA (osteoarthritis) of knee     with injections  . Gout   . Cardiomegaly   . Edema   . Other abnormal glucose   . Sciatica   . Unspecified sleep apnea   . Benign neoplasm of breast 2013  . Personal history of tobacco use, presenting hazards to health   . Diffuse cystic mastopathy 2013  . Sinus infection 2010  . Lump or mass in breast 2012  . Family history of malignant neoplasm of breast    Past Surgical History  Procedure Laterality Date  . Breast lumpectomy      right x1 ('89) left x2 ('70s, '90)  . Colonoscopy  11/02    diverticulosis  . US transvaginal pelvic modified  2000, 2001  . Dexa  3/02    normal  . Sleep study  5/05  . Colonoscopy  9/06    diverticulosis, hemorhoids  . Adenosine cardiolite  1/08    low risk   . Knee replacement Right 9/11    R. Dr. Telford Nab  . Cardiac catheterization  2001    normal per pt  . Total abdominal hysterectomy      fibroids, age of 84  . Appendectomy    . Tonsillectomy    . Eye surgery  2012    cataract  . Total knee arthroplasty Left 2012   Social History  Substance Use Topics  . Smoking status: Former Smoker -- 0.50 packs/day for 20 years    Types: Cigarettes  . Smokeless tobacco:  None     Comment: quit approx. 20 years ago  . Alcohol Use: 0.0 oz/week    0 Standard drinks or equivalent per week     Comment: wine occasional   Family History  Problem Relation Age of Onset  . Gout      whole family   . Stroke Father   . Hypertension Mother     (a lot of animosity in their relationship)  . Heart failure Mother   . Prostate cancer Brother   . Hepatitis  Brother     Hep C; alcoholism (terminal)  . Breast cancer Sister   . Other Brother     drug addiction   Allergies  Allergen Reactions  . Buspirone Hcl     REACTION: made her sleepy, ? swollen legs  . Lisinopril     REACTION: dizziness  . Prozac [Fluoxetine Hcl]     sleepy   Current Outpatient Prescriptions on File Prior to Visit  Medication Sig Dispense Refill  . ACCU-CHEK AVIVA PLUS test strip CHECK GLUCOSE WITH EACH MEAL AND AT BEDTIME AS DIRECTED 100 each 3  . ACCU-CHEK SOFTCLIX LANCETS lancets Check blood sugar once daily and as needed. Dx R73.01 100 each 3  . allopurinol (ZYLOPRIM) 100 MG tablet Take 1 tablet (100 mg total) by mouth daily. 30 tablet 11  . amLODipine (NORVASC) 5 MG tablet TAKE 1 TABLET BY MOUTH DAILY 30 tablet 11  . calcium carbonate (OS-CAL) 600 MG TABS Take 600 mg by mouth daily.     . furosemide (LASIX) 40 MG tablet TAKE 1 TABLET BY MOUTH DAILY 30 tablet 5  . losartan (COZAAR) 50 MG tablet Take 1 tablet (50 mg total) by mouth daily. 30 tablet 11  . MAGNESIUM CARBONATE PO Take 250 mg by mouth daily.    . metoprolol succinate (TOPROL-XL) 100 MG 24 hr tablet Take 1 tablet (100 mg total) by mouth daily. *Needs to schedule physical appt before future refills are given* 30 tablet 11  . Omega-3 Fatty Acids (FISH OIL PO) Take by mouth.    . potassium chloride SA (K-DUR,KLOR-CON) 20 MEQ tablet TAKE 1 TABLET BY MOUTH DAILY 30 tablet 3   No current facility-administered medications on file prior to visit.    Review of Systems Review of Systems  Constitutional: Negative for fever, appetite  change, fatigue and unexpected weight change.  Eyes: Negative for pain and visual disturbance.  Respiratory: Negative for cough and shortness of breath.   Cardiovascular: Negative for cp or palpitations    Gastrointestinal: Negative for nausea, diarrhea and constipation.  Genitourinary: Negative for urgency and frequency.  Skin: Negative for pallor or rash   MSK pos for bilat knee pain and mobility issues from obesity Neurological: Negative for weakness, light-headedness, numbness and headaches.  Hematological: Negative for adenopathy. Does not bruise/bleed easily.  Psychiatric/Behavioral: pos for depression and anxiety .         Objective:   Physical Exam  Constitutional: She appears well-developed and well-nourished. No distress.  Morbidly obese and well appearing   HENT:  Head: Normocephalic and atraumatic.  Right Ear: External ear normal.  Left Ear: External ear normal.  Mouth/Throat: Oropharynx is clear and moist.  Eyes: Conjunctivae and EOM are normal. Pupils are equal, round, and reactive to light. No scleral icterus.  Neck: Normal range of motion. Neck supple. No JVD present. Carotid bruit is not present. No thyromegaly present.  Cardiovascular: Normal rate, regular rhythm, normal heart sounds and intact distal pulses.  Exam reveals no gallop.   Pulmonary/Chest: Effort normal and breath sounds normal. No respiratory distress. She has no wheezes. She exhibits no tenderness.  Abdominal: Soft. Bowel sounds are normal. She exhibits no distension, no abdominal bruit and no mass. There is no tenderness.  Genitourinary: No breast swelling, tenderness, discharge or bleeding.  Breast exam: No mass, nodules, thickening, tenderness, bulging, retraction, inflamation, nipple discharge or skin changes noted.  No axillary or clavicular LA.      Musculoskeletal: Normal range of motion. She exhibits no edema or  tenderness.  Lymphadenopathy:    She has no cervical adenopathy.  Neurological:  She is alert. She has normal reflexes. No cranial nerve deficit. She exhibits normal muscle tone. Coordination normal.  Skin: Skin is warm and dry. No rash noted. No erythema. No pallor.  Psychiatric: Thought content normal. Her affect is blunt. Thought content is not paranoid. She exhibits a depressed mood. She expresses no homicidal and no suicidal ideation.          Assessment & Plan:   Problem List Items Addressed This Visit      Cardiovascular and Mediastinum   Essential hypertension - Primary    bp in fair control at this time  BP Readings from Last 1 Encounters:  02/11/15 128/76   No changes needed Disc lifstyle change with low sodium diet and exercise  Labs reviewed  Enc exercise and wt loss         Endocrine   Fasting hyperglycemia    Lab Results  Component Value Date   HGBA1C 6.1 12/31/2014   This is stable Enc wt loss and low glycemic diet to prevent DM        Other   Depression with anxiety    Taking prozac at 10 mg  Helps mood- but pt does not want to feel like her personality has changed (the 20 mg does this) Enc 10 mg Enc her to consider counseling       Estrogen deficiency   Relevant Orders   DG Bone Density   Hyperlipidemia LDL goal <100    Disc goals for lipids and reasons to control them Rev labs with pt Rev low sat fat diet in detail  Goal for LDL is under 100  Re check 6 mo      Medicare annual wellness visit, initial    Reviewed health habits including diet and exercise and skin cancer prevention Reviewed appropriate screening tests for age  Also reviewed health mt list, fam hx and immunization status , as well as social and family history   See HPI Labs reviewed Look at the handout I gave you on a tetanus shot and see where you can get one  prevnar vaccine today Please work on an advance directive ( living will and power of attorney)- see the blue booklet  Stop at check out for referral for bone density test  Work on cholesterol  (Avoid red meat/ fried foods/ egg yolks/ fatty breakfast meats/ butter, cheese and high fat dairy/ and shellfish)  Also watch sugar in diet  Also keep working on weight loss  Continue prozac at 10 mg daily  Follow up with me in 6 months with labs prior       Morbid obesity ([place])    Discussed how this problem influences overall health and the risks it imposes  Reviewed plan for weight loss with lower calorie diet (via better food choices and also portion control or program like weight watchers) and exercise building up to or more than 30 minutes 5 days per week including some aerobic activity   Pt has issues with mental health and compulsive overeating       Routine general medical examination at a health care facility    Reviewed health habits including diet and exercise and skin cancer prevention Reviewed appropriate screening tests for age  Also reviewed health mt list, fam hx and immunization status , as well as social and family history   See HPI Labs reviewed Look at the handout I gave  you on a tetanus shot and see where you can get one  prevnar vaccine today Please work on an advance directive ( living will and power of attorney)- see the blue booklet  Stop at check out for referral for bone density test  Work on cholesterol (Avoid red meat/ fried foods/ egg yolks/ fatty breakfast meats/ butter, cheese and high fat dairy/ and shellfish)  Also watch sugar in diet  Also keep working on weight loss  Continue prozac at 10 mg daily  Follow up with me in 6 months with labs prior        Other Visit Diagnoses    Need for vaccination with 13-polyvalent pneumococcal conjugate vaccine        Relevant Orders    Pneumococcal conjugate vaccine 13-valent (Completed)

## 2015-02-11 NOTE — Progress Notes (Signed)
Pre visit review using our clinic review tool, if applicable. No additional management support is needed unless otherwise documented below in the visit note. 

## 2015-02-16 NOTE — Assessment & Plan Note (Signed)
bp in fair control at this time  BP Readings from Last 1 Encounters:  02/11/15 128/76   No changes needed Disc lifstyle change with low sodium diet and exercise  Labs reviewed  Enc exercise and wt loss

## 2015-02-16 NOTE — Assessment & Plan Note (Signed)
Disc goals for lipids and reasons to control them Rev labs with pt Rev low sat fat diet in detail  Goal for LDL is under 100  Re check 6 mo

## 2015-02-16 NOTE — Assessment & Plan Note (Signed)
Taking prozac at 10 mg  Helps mood- but pt does not want to feel like her personality has changed (the 20 mg does this) Enc 10 mg Enc her to consider counseling

## 2015-02-16 NOTE — Assessment & Plan Note (Signed)
Reviewed health habits including diet and exercise and skin cancer prevention Reviewed appropriate screening tests for age  Also reviewed health mt list, fam hx and immunization status , as well as social and family history   See HPI Labs reviewed Look at the handout I gave you on a tetanus shot and see where you can get one  prevnar vaccine today Please work on an advance directive ( living will and power of attorney)- see the blue booklet  Stop at check out for referral for bone density test  Work on cholesterol (Avoid red meat/ fried foods/ egg yolks/ fatty breakfast meats/ butter, cheese and high fat dairy/ and shellfish)  Also watch sugar in diet  Also keep working on weight loss  Continue prozac at 10 mg daily  Follow up with me in 6 months with labs prior

## 2015-02-16 NOTE — Assessment & Plan Note (Signed)
Lab Results  Component Value Date   HGBA1C 6.1 12/31/2014   This is stable Enc wt loss and low glycemic diet to prevent DM

## 2015-02-16 NOTE — Assessment & Plan Note (Signed)
Discussed how this problem influences overall health and the risks it imposes  Reviewed plan for weight loss with lower calorie diet (via better food choices and also portion control or program like weight watchers) and exercise building up to or more than 30 minutes 5 days per week including some aerobic activity   Pt has issues with mental health and compulsive overeating

## 2015-02-19 ENCOUNTER — Encounter: Payer: Self-pay | Admitting: General Surgery

## 2015-02-19 ENCOUNTER — Other Ambulatory Visit: Payer: Self-pay | Admitting: Family Medicine

## 2015-02-25 ENCOUNTER — Encounter: Payer: Self-pay | Admitting: General Surgery

## 2015-02-25 ENCOUNTER — Ambulatory Visit (INDEPENDENT_AMBULATORY_CARE_PROVIDER_SITE_OTHER): Payer: Medicare Other | Admitting: General Surgery

## 2015-02-25 VITALS — BP 130/74 | HR 80 | Resp 16 | Ht 67.0 in | Wt 266.0 lb

## 2015-02-25 DIAGNOSIS — Z803 Family history of malignant neoplasm of breast: Secondary | ICD-10-CM | POA: Diagnosis not present

## 2015-02-25 DIAGNOSIS — N6019 Diffuse cystic mastopathy of unspecified breast: Secondary | ICD-10-CM | POA: Diagnosis not present

## 2015-02-25 NOTE — Patient Instructions (Addendum)
The patient is aware to call back for any questions or concerns.  The patient has been asked to return to the office in one year with a bilateral screening mammogram 

## 2015-02-25 NOTE — Progress Notes (Signed)
Patient ID: Monica [name], female   DOB: February 23, 1935, 80 y.o.   MRN: [id number]  Chief Complaint  Patient presents with  . Follow-up    mammogram    HPI Monica [name] is a 80 y.o. female.  who presents for a breast evaluation. The most recent mammogram was done on 02-17-15.  Patient does not consistently perform regular self breast checks but she gets regular mammograms done.  No new breast issues. I have reviewed the history of present illness with the patient.  HPI  Past Medical History  Diagnosis Date  . Allergic rhinitis   . Anxiety   . Depression   . Hyperlipidemia   . HTN (hypertension)   . Obesity   . Compulsive overeating   . OA (osteoarthritis) of knee     with injections  . Gout   . Cardiomegaly   . Edema   . Other abnormal glucose   . Sciatica   . Unspecified sleep apnea   . Benign neoplasm of breast 2013  . Personal history of tobacco use, presenting hazards to health   . Diffuse cystic mastopathy 2013  . Sinus infection 2010  . Lump or mass in breast 2012  . Family history of malignant neoplasm of breast     Past Surgical History  Procedure Laterality Date  . Breast lumpectomy      right x1 ('89) left x2 ('70s, '90)  . Colonoscopy  11/02    diverticulosis  . US transvaginal pelvic modified  2000, 2001  . Dexa  3/02    normal  . Sleep study  5/05  . Colonoscopy  9/06    diverticulosis, hemorhoids  . Adenosine cardiolite  1/08    low risk   . Knee replacement Right 9/11    R. Dr. Telford Nab  . Cardiac catheterization  2001    normal per pt  . Total abdominal hysterectomy      fibroids, age of 55  . Appendectomy    . Tonsillectomy    . Eye surgery  2012    cataract  . Total knee arthroplasty Left 2012    Family History  Problem Relation Age of Onset  . Gout      whole family   . Stroke Father   . Hypertension Mother     (a lot of animosity in their relationship)  . Heart failure Mother   . Prostate cancer Brother   . Hepatitis  Brother     Hep C; alcoholism (terminal)  . Breast cancer Sister   . Other Brother     drug addiction    Social History Social History  Substance Use Topics  . Smoking status: Former Smoker -- 0.50 packs/day for 20 years    Types: Cigarettes  . Smokeless tobacco: Never Used     Comment: quit approx. 20 years ago  . Alcohol Use: 0.0 oz/week    0 Standard drinks or equivalent per week     Comment: wine occasional    Allergies  Allergen Reactions  . Buspirone Hcl     REACTION: made her sleepy, ? swollen legs  . Lisinopril     REACTION: dizziness  . Prozac [Fluoxetine Hcl]     sleepy    Current Outpatient Prescriptions  Medication Sig Dispense Refill  . ACCU-CHEK AVIVA PLUS test strip CHECK GLUCOSE WITH EACH MEAL AND AT BEDTIME AS DIRECTED 100 each 3  . ACCU-CHEK SOFTCLIX LANCETS lancets Check blood sugar once daily and as needed. Dx R73.01 100  each 3  . allopurinol (ZYLOPRIM) 100 MG tablet Take 1 tablet (100 mg total) by mouth daily. 30 tablet 11  . amLODipine (NORVASC) 5 MG tablet TAKE 1 TABLET BY MOUTH DAILY 30 tablet 11  . calcium carbonate (OS-CAL) 600 MG TABS Take 600 mg by mouth daily.     Marland Kitchen FLUoxetine (PROZAC) 10 MG tablet Take 1 tablet (10 mg total) by mouth daily. 30 tablet 11  . furosemide (LASIX) 40 MG tablet TAKE 1 TABLET BY MOUTH DAILY 30 tablet 11  . losartan (COZAAR) 50 MG tablet Take 1 tablet (50 mg total) by mouth daily. 30 tablet 11  . MAGNESIUM CARBONATE PO Take 250 mg by mouth daily.    . metoprolol succinate (TOPROL-XL) 100 MG 24 hr tablet Take 1 tablet (100 mg total) by mouth daily. *Needs to schedule physical appt before future refills are given* 30 tablet 11  . Omega-3 Fatty Acids (FISH OIL PO) Take by mouth.    . potassium chloride SA (K-DUR,KLOR-CON) 20 MEQ tablet TAKE 1 TABLET BY MOUTH DAILY 30 tablet 11   No current facility-administered medications for this visit.    Review of Systems Review of Systems  Constitutional: Negative.    Respiratory: Negative.   Cardiovascular: Negative.     Blood pressure 130/74, pulse 80, resp. rate 16, height 5\' 7"  (1.702 m), weight 266 lb (120.657 kg).  Physical Exam Physical Exam  Constitutional: She is oriented to person, place, and time. She appears well-developed and well-nourished.  HENT:  Mouth/Throat: Oropharynx is clear and moist.  Eyes: Conjunctivae are normal. No scleral icterus.  Neck: Neck supple.  Cardiovascular: Normal rate, regular rhythm and normal heart sounds.   Pulmonary/Chest: Effort normal and breath sounds normal. Right breast exhibits no inverted nipple, no mass, no nipple discharge, no skin change and no tenderness. Left breast exhibits no inverted nipple, no mass, no nipple discharge, no skin change and no tenderness.  Abdominal: Soft.  Lymphadenopathy:    She has no cervical adenopathy.    She has no axillary adenopathy.  Neurological: She is alert and oriented to person, place, and time.  Skin: Skin is warm and dry.  Psychiatric: Her behavior is normal.    Data Reviewed Mammogram reviewed and stable.  Assessment    Stable physical exam. Family hisotry of breast cancer and history of fibrocystic disease.     Plan    The patient has been asked to return to the office in one year with a bilateral screening mammogram. Continue self breast exams. Call office for any new breast issues or concerns.       PCP:  Loura Pardon A This information has been scribed by Karie Fetch RNBC.   Darryle Dennie G 02/25/2015, 3:30 PM

## 2015-03-05 ENCOUNTER — Ambulatory Visit
Admission: RE | Admit: 2015-03-05 | Discharge: 2015-03-05 | Disposition: A | Discharge status: Home / Self Care | Payer: Medicare Other | Source: Ambulatory Visit | Attending: Family Medicine | Admitting: Family Medicine

## 2015-03-05 DIAGNOSIS — Z780 Asymptomatic menopausal state: Secondary | ICD-10-CM | POA: Insufficient documentation

## 2015-03-05 DIAGNOSIS — E2839 Other primary ovarian failure: Secondary | ICD-10-CM | POA: Diagnosis not present

## 2015-05-18 ENCOUNTER — Telehealth: Payer: Self-pay | Admitting: Family Medicine

## 2015-05-18 NOTE — Telephone Encounter (Signed)
I will see her then  

## 2015-05-18 NOTE — Telephone Encounter (Signed)
Patient Name: Monica [name]  DOB: 01-16-1936    Initial Comment Caller states her skin has irritation, under her abdominal area.    Nurse Assessment  Nurse: Verlin Fester RN, Stanton Kidney Date/Time (Eastern Time): 05/18/2015 10:23:51 AM  Confirm and document reason for call. If symptomatic, describe symptoms. You must click the next button to save text entered. ---Patient has an irritation in the abdominal skin fold, it is red and also where it touches her thigh.  Has the patient traveled out of the country within the last 30 days? ---No  Does the patient have any new or worsening symptoms? ---Yes  Will a triage be completed? ---Yes  Related visit to physician within the last 2 weeks? ---No  Does the PT have any chronic conditions? (i.e. diabetes, asthma, etc.) ---No  Is this a behavioral health or substance abuse call? ---No     Guidelines    Guideline Title Affirmed Question Affirmed Notes  Jock Itch Female    Final Disposition User   See PCP When Office is Open (within 3 days) Verlin Fester, RN, Stanton Kidney    Disagree/Comply: Comply

## 2015-05-18 NOTE — Telephone Encounter (Signed)
Pt has appt on 05/20/15 at 11:15 with Dr Glori Bickers.

## 2015-05-20 ENCOUNTER — Ambulatory Visit: Payer: Medicare Other | Admitting: Family Medicine

## 2015-05-25 ENCOUNTER — Ambulatory Visit: Payer: Medicare Other | Admitting: Interventional Cardiology

## 2015-06-19 ENCOUNTER — Encounter: Payer: Self-pay | Admitting: Family Medicine

## 2015-06-19 ENCOUNTER — Ambulatory Visit (INDEPENDENT_AMBULATORY_CARE_PROVIDER_SITE_OTHER): Payer: Medicare Other | Admitting: Family Medicine

## 2015-06-19 VITALS — BP 120/74 | HR 85 | Temp 98.6°F | Ht 67.0 in | Wt 265.6 lb

## 2015-06-19 DIAGNOSIS — H9201 Otalgia, right ear: Secondary | ICD-10-CM

## 2015-06-19 DIAGNOSIS — J329 Chronic sinusitis, unspecified: Secondary | ICD-10-CM

## 2015-06-19 DIAGNOSIS — J310 Chronic rhinitis: Secondary | ICD-10-CM

## 2015-06-19 MED ORDER — OXYMETAZOLINE HCL 0.05 % NA SOLN: 1.0000 | Freq: Two times a day (BID) | NASAL | Status: AC

## 2015-06-19 MED ORDER — OFLOXACIN 0.3 % OT SOLN: 5.0000 [drp] | Freq: Two times a day (BID) | OTIC | Status: DC

## 2015-06-19 MED ORDER — DOXYCYCLINE HYCLATE 100 MG PO CAPS: 100.0000 mg | ORAL_CAPSULE | Freq: Two times a day (BID) | ORAL | Status: DC

## 2015-06-19 NOTE — Progress Notes (Signed)
HPI:  Monica [name] is a pleasant 80 year old here for an acute visit for sinus issues and right knee pain.  Sinus congestion: -started: About a week or so ago -symptoms:nasal congestion, sore throat, cough - now has developed some  intermittent right maxillary pain and thick yellow nasal congestion -denies:fever, SOB, NVD, tooth pain -has tried:  nothing  -sick contacts/travel/risks: no reported flu, strep or tick exposure -Hx of: allergies, but does not feel that these have been very bad this year    right ear pain: -Intermittently for several weeks, worse for the last 1 week -Denies hearing loss, drainage from the ear tinnitus or fevers   ROS: See pertinent positives and negatives per HPI.  Past Medical History  Diagnosis Date  . Allergic rhinitis   . Anxiety   . Depression   . Hyperlipidemia   . HTN (hypertension)   . Obesity   . Compulsive overeating   . OA (osteoarthritis) of knee     with injections  . Gout   . Cardiomegaly   . Edema   . Other abnormal glucose   . Sciatica   . Unspecified sleep apnea   . Benign neoplasm of breast 2013  . Personal history of tobacco use, presenting hazards to health   . Diffuse cystic mastopathy 2013  . Sinus infection 2010  . Lump or mass in breast 2012  . Family history of malignant neoplasm of breast     Past Surgical History  Procedure Laterality Date  . Breast lumpectomy      right x1 ('89) left x2 ('70s, '90)  . Colonoscopy  11/02    diverticulosis  . US transvaginal pelvic modified  2000, 2001  . Dexa  3/02    normal  . Sleep study  5/05  . Colonoscopy  9/06    diverticulosis, hemorhoids  . Adenosine cardiolite  1/08    low risk   . Knee replacement Right 9/11    R. Dr. Telford Nab  . Cardiac catheterization  2001    normal per pt  . Total abdominal hysterectomy      fibroids, age of 60  . Appendectomy    . Tonsillectomy    . Eye surgery  2012    cataract  . Total knee arthroplasty Left 2012     Family History  Problem Relation Age of Onset  . Gout      whole family   . Stroke Father   . Hypertension Mother     (a lot of animosity in their relationship)  . Heart failure Mother   . Prostate cancer Brother   . Hepatitis Brother     Hep C; alcoholism (terminal)  . Breast cancer Sister   . Other Brother     drug addiction    Social History   Social History  . Marital Status: Widowed    Spouse Name: N/A  . Number of Children: 1  . Years of Education: N/A   Occupational History  . Retired school principal    Social History Main Topics  . Smoking status: Former Smoker -- 0.50 packs/day for 20 years    Types: Cigarettes  . Smokeless tobacco: Never Used     Comment: quit approx. 20 years ago  . Alcohol Use: 0.0 oz/week    0 Standard drinks or equivalent per week     Comment: wine occasional  . Drug Use: No  . Sexual Activity: No   Other Topics Concern  . None  Social History Narrative   Widowed, 1 daughter. 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  ACCU-CHEK AVIVA PLUS test strip, CHECK GLUCOSE WITH EACH MEAL AND AT BEDTIME AS DIRECTED, Disp: 100 each, Rfl: 3 .  ACCU-CHEK SOFTCLIX LANCETS lancets, Check blood sugar once daily and as needed. Dx R73.01, Disp: 100 each, Rfl: 3 .  allopurinol (ZYLOPRIM) 100 MG tablet, Take 1 tablet (100 mg total) by mouth daily., Disp: 30 tablet, Rfl: 11 .  amLODipine (NORVASC) 5 MG tablet, TAKE 1 TABLET BY MOUTH DAILY, Disp: 30 tablet, Rfl: 11 .  calcium carbonate (OS-CAL) 600 MG TABS, Take 600 mg by mouth daily. , Disp: , Rfl:  .  furosemide (LASIX) 40 MG tablet, TAKE 1 TABLET BY MOUTH DAILY, Disp: 30 tablet, Rfl: 11 .  losartan (COZAAR) 50 MG tablet, Take 1 tablet (50 mg total) by mouth daily., Disp: 30 tablet, Rfl: 11 .  MAGNESIUM CARBONATE PO, Take 250 mg by mouth daily., Disp: , Rfl:  .  metoprolol succinate (TOPROL-XL) 100 MG 24 hr tablet, Take 1  tablet (100 mg total) by mouth daily. *Needs to schedule physical appt before future refills are given*, Disp: 30 tablet, Rfl: 11 .  Omega-3 Fatty Acids (FISH OIL PO), Take by mouth., Disp: , Rfl:  .  potassium chloride SA (K-DUR,KLOR-CON) 20 MEQ tablet, TAKE 1 TABLET BY MOUTH DAILY, Disp: 30 tablet, Rfl: 11 .  doxycycline (VIBRAMYCIN) 100 MG capsule, Take 1 capsule (100 mg total) by mouth 2 (two) times daily., Disp: 14 capsule, Rfl: 0 .  ofloxacin (FLOXIN OTIC) 0.3 % otic solution, Place 5 drops into the right ear 2 (two) times daily. For 7 days., Disp: 5 mL, Rfl: 0 .  oxymetazoline (AFRIN NASAL SPRAY) 0.05 % nasal spray, Place 1 spray into both nostrils 2 (two) times daily. For 5 days. Then stop. Do not use longer than 5 days., Disp: 30 mL, Rfl: 0  EXAM:  Filed Vitals:   06/19/15 1443  BP: 120/74  Pulse: 85  Temp: 98.6 F (37 C)    Body mass index is 41.59 kg/(m^2).  GENERAL: vitals reviewed and listed above, alert, oriented, appears well hydrated and in no acute distress  HEENT: atraumatic, conjunttiva clear, no obvious abnormalities on inspection of external nose and ears, normal appearance of ear canals and TMs except for mild erythema and minimal edema of the right ear canal, no mastoid tenderness to palpation, clear nasal congestion, mild post oropharyngeal erythema with PND, no tonsillar edema or exudate, no sinus TTP, no catching or clicking of the jaw or TMJ crepitus  NECK: no obvious masses on inspection, no significant lymphadenopathy, no bruits  LUNGS: clear to auscultation bilaterally, no wheezes, rales or rhonchi, good air movement  CV: HRRR, no peripheral edema  MS: moves all extremities without noticeable abnormality  PSYCH: pleasant and cooperative, no obvious depression or anxiety  ASSESSMENT AND PLAN:  Discussed the following assessment and plan:  Rhinosinusitis -given HPI and exam findings today, a serious infection or illness is unlikely. We discussed  potential etiologies, with VURI being most likely, potentially developing right maxillary sinusitis given her facial pain, though no exam findings suggest this. Advised supportive care and monitoring, delayed antibiotic if not improving or worsening in the next few days after discussion of risks and proper use. We discussed treatment side effects, likely course, antibiotic misuse, transmission, and signs of developing a  serious illness.  Ear pain, right -Query mild otitis externa, opted for a course of topical ABX drops, and signs or symptoms to suggest severe infection -Follow up with PCP if does not respond to treatment or is worsening -Daughter wants her to see ear nose and throat doctor if does not resolve, provided her with a brochure of local ear nose and throat options.  -of course, we advised to ret urn or notify a doctor immediately if symptoms worsen or persist or new concerns arise.    Patient Instructions  Please start Afrin nasal spray for 3-5 days twice daily. Do not use longer than 5 days.  Please start the ofloxacin eardrops and use per instructions for 7 days.  If you have continued pain in the right side of your face or symptoms are not improving, please start the oral antibiotic, doxycycline. Please do not get any sun exposure while taking this medication. Please take according to instructions. Please shred and discard this medication if you do not use it.  Nasal saline twice daily.  Seek care promptly if symptoms are worsening, he developed new concerns or your symptoms do not resolve with treatment.     Monica Benton R.

## 2015-06-19 NOTE — Progress Notes (Signed)
Pre visit review using our clinic review tool, if applicable. No additional management support is needed unless otherwise documented below in the visit note. 

## 2015-06-19 NOTE — Patient Instructions (Addendum)
Please start Afrin nasal spray for 3-5 days twice daily. Do not use longer than 5 days.  Please start the ofloxacin eardrops and use per instructions for 7 days.  If you have continued pain in the right side of your face or symptoms are not improving, please start the oral antibiotic, doxycycline. Please do not get any sun exposure while taking this medication. Please take according to instructions. Please shred and discard this medication if you do not use it.  Nasal saline twice daily.  Seek care promptly if symptoms are worsening, he developed new concerns or your symptoms do not resolve with treatment.

## 2015-06-22 ENCOUNTER — Other Ambulatory Visit: Payer: Self-pay | Admitting: *Deleted

## 2015-06-22 ENCOUNTER — Other Ambulatory Visit: Payer: Self-pay | Admitting: Interventional Cardiology

## 2015-06-22 MED ORDER — AMLODIPINE BESYLATE 5 MG PO TABS: 5.0000 mg | ORAL_TABLET | Freq: Every day | ORAL | Status: DC

## 2015-06-24 ENCOUNTER — Ambulatory Visit (INDEPENDENT_AMBULATORY_CARE_PROVIDER_SITE_OTHER): Payer: Medicare Other | Admitting: Family Medicine

## 2015-06-24 ENCOUNTER — Encounter: Payer: Self-pay | Admitting: Family Medicine

## 2015-06-24 VITALS — BP 118/64 | HR 80 | Temp 97.5°F | Ht 67.0 in | Wt 263.0 lb

## 2015-06-24 DIAGNOSIS — F502 Bulimia nervosa: Secondary | ICD-10-CM

## 2015-06-24 DIAGNOSIS — F4323 Adjustment disorder with mixed anxiety and depressed mood: Secondary | ICD-10-CM | POA: Diagnosis not present

## 2015-06-24 DIAGNOSIS — F5089 Other specified eating disorder: Secondary | ICD-10-CM

## 2015-06-24 NOTE — Progress Notes (Signed)
Pre visit review using our clinic review tool, if applicable. No additional management support is needed unless otherwise documented below in the visit note. 

## 2015-06-24 NOTE — Progress Notes (Signed)
Subjective:    Patient ID: Monica [name], female    DOB: Dec 23, 1935, 80 y.o.   MRN: [id number]  HPI Here for body pain Stays achey and feels swollen all over This causes fatigue and difficulty with exercise  Knows it is all related to her obesity  Takes lasix to help with laxix  Takes amlodipine for bp   BP Readings from Last 3 Encounters:  06/24/15 118/64  06/19/15 120/74  02/25/15 130/74    Overall her fatigue is the worst symptom   Knows her weight is a big problem  Thinks her weight causes a lot of these problems   Wt is down 2 lb from the last visit Morbidly obese with bmi of 41   Stopped prozac - did not like it and did not think it helped Declines counseling  Writes in a journal  Needs to set limits wit her daughter   She wants a px for valium  Is interested in seeing a psychiatrist   Would be interested in seeing a nutritionist (in the future-but not right now) Her daughter wants to get her in a program at [facility] - she states this makes her angry  Patient Active Problem List   Diagnosis Date Noted  . Estrogen deficiency 02/11/2015  . Medicare annual wellness visit, initial 02/11/2015  . Routine general medical examination at a health care facility 02/11/2015  . Compulsive overeating 01/01/2015  . Family history of breast cancer 02/28/2014  . Venous stasis dermatitis 02/27/2013  . Diffuse cystic mastopathy 02/11/2013  . Chronic diastolic heart failure (HCC) 12/12/2012    Class: Chronic  . Intertrigo 11/27/2012  . Lump or mass in breast   . Abdominal pain, chronic, right lower quadrant 02/23/2012  . Urge incontinence of urine 09/09/2011  . Rash and nonspecific skin eruption 08/08/2011  . OSTEOARTHRITIS, KNEES, BILATERAL 02/09/2010  . GOUT, UNSPECIFIED 03/20/2008  . Fasting hyperglycemia 02/27/2008  . Hyperlipidemia LDL goal <100 12/21/2006  . Morbid obesity ([place]) 12/21/2006  . Adjustment disorder with mixed anxiety and depressed mood 12/21/2006  .  Depression with anxiety 12/21/2006  . Essential hypertension 12/21/2006  . Allergic rhinitis, cause unspecified 12/21/2006  . SCIATICA 12/21/2006  . SLEEP APNEA 12/21/2006  . EDEMA 12/21/2006  . OVEREATING 12/21/2006   Past Medical History  Diagnosis Date  . Allergic rhinitis   . Anxiety   . Depression   . Hyperlipidemia   . HTN (hypertension)   . Obesity   . Compulsive overeating   . OA (osteoarthritis) of knee     with injections  . Gout   . Cardiomegaly   . Edema   . Other abnormal glucose   . Sciatica   . Unspecified sleep apnea   . Benign neoplasm of breast 2013  . Personal history of tobacco use, presenting hazards to health   . Diffuse cystic mastopathy 2013  . Sinus infection 2010  . Lump or mass in breast 2012  . Family history of malignant neoplasm of breast    Past Surgical History  Procedure Laterality Date  . Breast lumpectomy      right x1 ('89) left x2 ('70s, '90)  . Colonoscopy  11/02    diverticulosis  . US transvaginal pelvic modified  2000, 2001  . Dexa  3/02    normal  . Sleep study  5/05  . Colonoscopy  9/06    diverticulosis, hemorhoids  . Adenosine cardiolite  1/08    low risk   . Knee replacement Right  9/11    R. Dr. Telford Nab  . Cardiac catheterization  2001    normal per pt  . Total abdominal hysterectomy      fibroids, age of 80  . Appendectomy    . Tonsillectomy    . Eye surgery  2012    cataract  . Total knee arthroplasty Left 2012   Social History  Substance Use Topics  . Smoking status: Former Smoker -- 0.50 packs/day for 20 years    Types: Cigarettes  . Smokeless tobacco: Never Used     Comment: quit approx. 20 years ago  . Alcohol Use: 0.0 oz/week    0 Standard drinks or equivalent per week     Comment: wine occasional   Family History  Problem Relation Age of Onset  . Gout      whole family   . Stroke Father   . Hypertension Mother     (a lot of animosity in their relationship)  . Heart failure Mother   .  Prostate cancer Brother   . Hepatitis Brother     Hep C; alcoholism (terminal)  . Breast cancer Sister   . Other Brother     drug addiction   Allergies  Allergen Reactions  . Buspirone Hcl     REACTION: made her sleepy, ? swollen legs  . Lisinopril     REACTION: dizziness  . Prozac [Fluoxetine Hcl]     sleepy   Current Outpatient Prescriptions on File Prior to Visit  Medication Sig Dispense Refill  . ACCU-CHEK AVIVA PLUS test strip CHECK GLUCOSE WITH EACH MEAL AND AT BEDTIME AS DIRECTED 100 each 3  . ACCU-CHEK SOFTCLIX LANCETS lancets Check blood sugar once daily and as needed. Dx R73.01 100 each 3  . allopurinol (ZYLOPRIM) 100 MG tablet Take 1 tablet (100 mg total) by mouth daily. 30 tablet 11  . amLODipine (NORVASC) 5 MG tablet Take 1 tablet (5 mg total) by mouth daily. 30 tablet 0  . calcium carbonate (OS-CAL) 600 MG TABS Take 600 mg by mouth daily.     . furosemide (LASIX) 40 MG tablet TAKE 1 TABLET BY MOUTH DAILY 30 tablet 11  . losartan (COZAAR) 50 MG tablet Take 1 tablet (50 mg total) by mouth daily. 30 tablet 11  . MAGNESIUM CARBONATE PO Take 250 mg by mouth daily.    . metoprolol succinate (TOPROL-XL) 100 MG 24 hr tablet Take 1 tablet (100 mg total) by mouth daily. *Needs to schedule physical appt before future refills are given* 30 tablet 11  . ofloxacin (FLOXIN OTIC) 0.3 % otic solution Place 5 drops into the right ear 2 (two) times daily. For 7 days. 5 mL 0  . Omega-3 Fatty Acids (FISH OIL PO) Take by mouth.    . potassium chloride SA (K-DUR,KLOR-CON) 20 MEQ tablet TAKE 1 TABLET BY MOUTH DAILY 30 tablet 11   No current facility-administered medications on file prior to visit.     Review of Systems Review of Systems  Constitutional: Negative for fever, appetite change, and unexpected weight change. pos for fatigue  Eyes: Negative for pain and visual disturbance.  Respiratory: Negative for cough and shortness of breath.   Cardiovascular: Negative for cp or  palpitations   pos for pedal edema Gastrointestinal: Negative for nausea, diarrhea and constipation. pos for abdominal bloating that worsens after stressful events  Genitourinary: Negative for urgency and frequency. neg for dysuria Skin: Negative for pallor or rash   MSK pos for myofasical pain and joint  pain , neg for joint swelling , this limits exercise to about 15 minutes at a time  Neurological: Negative for weakness, light-headedness, numbness and headaches.  Hematological: Negative for adenopathy. Does not bruise/bleed easily.  Psychiatric/Behavioral: pos for dysphoric mood and anxiety, neg for SI         Objective:   Physical Exam  Constitutional: She appears well-developed and well-nourished. No distress.  Morbidly obese and well appearing   HENT:  Head: Normocephalic and atraumatic.  Eyes: Conjunctivae and EOM are normal. Pupils are equal, round, and reactive to light.  Neck: Normal range of motion. Neck supple. No JVD present. Carotid bruit is not present. No thyromegaly present.  Cardiovascular: Normal rate, regular rhythm, normal heart sounds and intact distal pulses.  Exam reveals no gallop.   Pulmonary/Chest: Effort normal and breath sounds normal. No respiratory distress. She has no wheezes. She has no rales.  No crackles  Abdominal: Soft. Bowel sounds are normal. She exhibits no distension and no abdominal bruit.  Musculoskeletal: She exhibits no edema.  No pitting edema in ankles today  Lymphadenopathy:    She has no cervical adenopathy.  Neurological: She is alert. She has normal reflexes. She exhibits normal muscle tone. Coordination normal.  No tremor  Skin: Skin is warm and dry. No rash noted.  Psychiatric: Her mood appears anxious. Her affect is not blunt and not labile. Her speech is tangential. She is not slowed and not withdrawn. Thought content is not paranoid. She exhibits a depressed mood. She expresses no homicidal and no suicidal ideation.  Anxious  today-tearful at times when discussing stressors and difficulty with interpersonal relationships  Insight is fair at best   Tangential and difficult to follow    She is inattentive.          Assessment & Plan:   Problem List Items Addressed This Visit      Other   Adjustment disorder with mixed anxiety and depressed mood - Primary    Pt had multiple mental and physical complaints today with tangential speech/ ? Flight of ideas or just difficulty concentrating, and was very hard to follow  Main issues revolve around anxiety (she repeatedly asked for valium), emotional eating causing morbid obesity and inability to form social relationships  Also repeated the phrase "I don't like people to tell me what to do" Reviewed stressors/ coping techniques/symptoms/ support sources/ tx options and side effects in detail today  Insight is fair  She has failed prozac in the past (I saw improvement but pt did not) Has had counseling many times but dislikes it and saw no improvement  I do wonder if she has a possible personality disorder  Offered several options incl trial of another SSRI and counseling -she declined We agreed on psychiatry referral to see if that would help -referral placed  >25 minutes spent in face to face time with patient, >50% spent in counselling or coordination of care       Relevant Orders   Ambulatory referral to Psychiatry   Compulsive overeating    This is still a big problem leading to morbid obesity then then leads to physical symptoms and dependence on others Have been unsuccessful with counseling  We discussed a nutrition referral -she declined for now but may be interested later Also voiced control issues with her daughter that make her more distressed and eat more  Did psychiatry ref today to see if this helps with mood and thus this behavior

## 2015-06-24 NOTE — Patient Instructions (Signed)
Set limits with your daughter  Exercise to the best of your ability  Stop at check out for referral to psychiatry  If you want to see a nutritionist to help make a plan for weight loss let me know

## 2015-06-25 NOTE — Assessment & Plan Note (Signed)
This is still a big problem leading to morbid obesity then then leads to physical symptoms and dependence on others Have been unsuccessful with counseling  We discussed a nutrition referral -she declined for now but may be interested later Also voiced control issues with her daughter that make her more distressed and eat more  Did psychiatry ref today to see if this helps with mood and thus this behavior

## 2015-06-25 NOTE — Assessment & Plan Note (Signed)
Pt had multiple mental and physical complaints today with tangential speech/ ? Flight of ideas or just difficulty concentrating, and was very hard to follow  Main issues revolve around anxiety (she repeatedly asked for valium), emotional eating causing morbid obesity and inability to form social relationships  Also repeated the phrase "I don't like people to tell me what to do" Reviewed stressors/ coping techniques/symptoms/ support sources/ tx options and side effects in detail today  Insight is fair  She has failed prozac in the past (I saw improvement but pt did not) Has had counseling many times but dislikes it and saw no improvement  I do wonder if she has a possible personality disorder  Offered several options incl trial of another SSRI and counseling -she declined We agreed on psychiatry referral to see if that would help -referral placed  >25 minutes spent in face to face time with patient, >50% spent in counselling or coordination of care

## 2015-07-21 ENCOUNTER — Other Ambulatory Visit: Payer: Self-pay | Admitting: Interventional Cardiology

## 2015-07-23 ENCOUNTER — Encounter: Payer: Self-pay | Admitting: Interventional Cardiology

## 2015-07-23 ENCOUNTER — Ambulatory Visit (INDEPENDENT_AMBULATORY_CARE_PROVIDER_SITE_OTHER): Payer: Medicare Other | Admitting: Interventional Cardiology

## 2015-07-23 VITALS — BP 130/90 | HR 69 | Ht 67.0 in | Wt 261.0 lb

## 2015-07-23 DIAGNOSIS — I10 Essential (primary) hypertension: Secondary | ICD-10-CM | POA: Diagnosis not present

## 2015-07-23 DIAGNOSIS — I8310 Varicose veins of unspecified lower extremity with inflammation: Secondary | ICD-10-CM

## 2015-07-23 DIAGNOSIS — G4730 Sleep apnea, unspecified: Secondary | ICD-10-CM

## 2015-07-23 DIAGNOSIS — R0683 Snoring: Secondary | ICD-10-CM | POA: Diagnosis not present

## 2015-07-23 DIAGNOSIS — E785 Hyperlipidemia, unspecified: Secondary | ICD-10-CM

## 2015-07-23 DIAGNOSIS — I5032 Chronic diastolic (congestive) heart failure: Secondary | ICD-10-CM

## 2015-07-23 DIAGNOSIS — I872 Venous insufficiency (chronic) (peripheral): Secondary | ICD-10-CM

## 2015-07-23 MED ORDER — AMLODIPINE BESYLATE 5 MG PO TABS: 5.0000 mg | ORAL_TABLET | Freq: Every day | ORAL | Status: DC

## 2015-07-23 NOTE — Progress Notes (Signed)
Cardiology Office Note    Date:  07/23/2015   ID:  [name], DOB 06-22-1935, MRN [id number]  PCP:  Loura Pardon, MD  Cardiologist: Sinclair Grooms, MD   Chief Complaint  Patient presents with  . Congestive Heart Failure  . Follow-up    Hypertension    History of Present Illness:  Monica [name] is a 80 y.o. female follow-up of severe hypertension, left ventricular hypertrophy with diastolic left ventricular heart failure, obesity, medication and therapy noncompliance, obesity, known obstructive sleep apnea but not compliant with therapy, and cardiomegaly.  Monica [name] is here today for follow-up of hypertension and diastolic dysfunction. Her major complaint however symptoms around excessive fatigue, excessive daytime sleepiness, difficulty getting a full night's sleep because of frequent awakening, frequent nocturia, and lower extremity swelling. She denies chest pain. She endorses compliance with her medical regimen. She denies use of her C Pap because of bulkiness of the equipment. She has had a diagnosis of sleep apnea perhaps for greater than 10 years. She has not uses a device for greater than 5 years. She complained of equipment being too bulky.    Past Medical History  Diagnosis Date  . Allergic rhinitis   . Anxiety   . Depression   . Hyperlipidemia   . HTN (hypertension)   . Obesity   . Compulsive overeating   . OA (osteoarthritis) of knee     with injections  . Gout   . Cardiomegaly   . Edema   . Other abnormal glucose   . Sciatica   . Unspecified sleep apnea   . Benign neoplasm of breast 2013  . Personal history of tobacco use, presenting hazards to health   . Diffuse cystic mastopathy 2013  . Sinus infection 2010  . Lump or mass in breast 2012  . Family history of malignant neoplasm of breast     Past Surgical History  Procedure Laterality Date  . Breast lumpectomy      right x1 ('89) left x2 ('70s, '90)  . Colonoscopy  11/02   diverticulosis  . US transvaginal pelvic modified  2000, 2001  . Dexa  3/02    normal  . Sleep study  5/05  . Colonoscopy  9/06    diverticulosis, hemorhoids  . Adenosine cardiolite  1/08    low risk   . Knee replacement Right 9/11    R. Dr. Telford Nab  . Cardiac catheterization  2001    normal per pt  . Total abdominal hysterectomy      fibroids, age of 80  . Appendectomy    . Tonsillectomy    . Eye surgery  2012    cataract  . Total knee arthroplasty Left 2012    Current Medications: Outpatient Prescriptions Prior to Visit  Medication Sig Dispense Refill  . ACCU-CHEK AVIVA PLUS test strip CHECK GLUCOSE WITH EACH MEAL AND AT BEDTIME AS DIRECTED 100 each 3  . ACCU-CHEK SOFTCLIX LANCETS lancets Check blood sugar once daily and as needed. Dx R73.01 100 each 3  . allopurinol (ZYLOPRIM) 100 MG tablet Take 1 tablet (100 mg total) by mouth daily. 30 tablet 11  . calcium carbonate (OS-CAL) 600 MG TABS Take 600 mg by mouth daily.     . furosemide (LASIX) 40 MG tablet TAKE 1 TABLET BY MOUTH DAILY 30 tablet 11  . losartan (COZAAR) 50 MG tablet Take 1 tablet (50 mg total) by mouth daily. 30 tablet 11  . MAGNESIUM CARBONATE PO Take 250 mg  by mouth daily.    . metoprolol succinate (TOPROL-XL) 100 MG 24 hr tablet Take 1 tablet (100 mg total) by mouth daily. *Needs to schedule physical appt before future refills are given* 30 tablet 11  . ofloxacin (FLOXIN OTIC) 0.3 % otic solution Place 5 drops into the right ear 2 (two) times daily. For 7 days. 5 mL 0  . Omega-3 Fatty Acids (FISH OIL PO) Take by mouth.    . potassium chloride SA (K-DUR,KLOR-CON) 20 MEQ tablet TAKE 1 TABLET BY MOUTH DAILY 30 tablet 11  . amLODipine (NORVASC) 5 MG tablet TAKE 1 TABLET BY MOUTH DAILY 30 tablet 0   No facility-administered medications prior to visit.     Allergies:   Buspirone hcl; Lisinopril; and Prozac   Social History   Social History  . Marital Status: Widowed    Spouse Name: N/A  . Number of  Children: 1  . Years of Education: N/A   Occupational History  . Retired school principal    Social History Main Topics  . Smoking status: Former Smoker -- 0.50 packs/day for 20 years    Types: Cigarettes  . Smokeless tobacco: Never Used     Comment: quit approx. 20 years ago  . Alcohol Use: 0.0 oz/week    0 Standard drinks or equivalent per week     Comment: wine occasional  . Drug Use: No  . Sexual Activity: No   Other Topics Concern  . None   Social History Narrative   Widowed, 1 daughter. 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   ROS:   Please see the history of present illness.    Lower extremity including ankles swelling; loud snoring; wheezing; depression; difficulty with balance; and anxiety.All other systems reviewed and are negative.   PHYSICAL EXAM:   VS:  BP 130/90 mmHg  Pulse 69  Ht 5\' 7"  (1.702 m)  Wt 261 lb (118.389 kg)  BMI 40.87 kg/m2   GEN: Well nourished, well developed, in no acute distress HEENT: normal Neck: no JVD, carotid bruits, or masses Cardiac: RRR; no murmurs, rubs, or gallops. There is right greater than left 1+ pitting ankle and foot edema  area  Respiratory:  clear to auscultation bilaterally, normal work of breathing GI: soft, nontender, nondistended, + BS MS: no deformity or atrophy Skin: warm and dry, no rash Neuro:  Alert and Oriented x 3, Strength and sensation are intact Psych: euthymic mood, full affect  Wt Readings from Last 3 Encounters:  07/23/15 261 lb (118.389 kg)  06/24/15 263 lb (119.296 kg)  06/19/15 265 lb 9.6 oz (120.475 kg)      Studies/Labs Reviewed:   EKG:  EKG  Is performed and reveals sinus bradycardia, nonspecific T-wave flattening,  J-point depression, and otherwise unremarkable.   Current Labs: 02/06/2015: ALT 39*; BUN 16; Creatinine, Ser 0.75; Hemoglobin 13.3; Platelets 236.0; Potassium 3.6; Sodium 143; TSH 1.71   Lipid Panel    Component Value Date/Time   CHOL 187 02/06/2015 1048   TRIG 96.0 02/06/2015 1048   HDL 41.40 02/06/2015 1048   CHOLHDL 5 02/06/2015 1048   VLDL 19.2 02/06/2015 1048   LDLCALC 126* 02/06/2015 1048   LDLDIRECT 131.8  12/09/2009 1643    Additional studies/ records that were reviewed today include: No significant recent clinical data.     ASSESSMENT:    1. Chronic diastolic heart failure ([place])   2. Sleep apnea   3. Snoring   4. Hyperlipidemia LDL goal <100   5. Essential hypertension   6. Venous stasis dermatitis, unspecified laterality      PLAN:  In order of problems listed above:  1. There is no evidence of volume overload based upon the veins and pulmonary exam. Lower extremity edema is likely related to venous stasis  2.  Known history of sleep apnea but without compliance with C Pap apparatus. Lower extremity edema is potentially contributed to by pulmonary hypertension. We will reevaluate the severity of the patient's sleep apnea with a split-night study. Referred patient to Dr. Radford Pax for management going forward. Much of for complaints including excessive daytime sleepiness and fatigue may be all related to inadequate therapy.  3.  Related to the sleep apnea syndrome  4.  Followed by her primary physician.    Medication Adjustments/Labs and Tests Ordered: Current medicines are reviewed at length with the patient today.  Concerns regarding medicines are outlined above.  Medication changes, Labs and Tests ordered today are listed in the Patient Instructions below. Patient Instructions  Medication Instructions:  Your physician recommends that you continue on your current medications as directed. Please refer to the Current Medication list given to you today.    Labwork: None ordered  Testing/Procedures: Your physician has recommended that you have a sleep study. This test records several body functions during sleep, including: brain activity, eye movement, oxygen and carbon dioxide blood levels, heart rate and rhythm, breathing rate and rhythm, the flow of air through your mouth and nose, snoring, body muscle movements, and chest and belly movement.   Follow-Up: You have been referred to Dr. Radford Pax for sleep  Your physician wants you to follow-up in: 1 year with Dr.Smith You will receive a reminder letter in the mail two months in advance. If you don't receive a letter, please call our office to schedule the follow-up appointment.    Any Other Special Instructions Will Be Listed Below (If Applicable).     If you need a refill on your cardiac medications before your next appointment, please call your pharmacy.      Signed, Sinclair Grooms, MD  07/23/2015 3:32 PM    [facility] [place], [place], [place]  [address] Phone: [phone]; Fax: [phone]

## 2015-07-23 NOTE — Patient Instructions (Signed)
Medication Instructions:  Your physician recommends that you continue on your current medications as directed. Please refer to the Current Medication list given to you today.   Labwork: None ordered  Testing/Procedures: Your physician has recommended that you have a sleep study. This test records several body functions during sleep, including: brain activity, eye movement, oxygen and carbon dioxide blood levels, heart rate and rhythm, breathing rate and rhythm, the flow of air through your mouth and nose, snoring, body muscle movements, and chest and belly movement.   Follow-Up: You have been referred to Dr. Radford Pax for sleep  Your physician wants you to follow-up in: 1 year with Dr.Smith You will receive a reminder letter in the mail two months in advance. If you don't receive a letter, please call our office to schedule the follow-up appointment.    Any Other Special Instructions Will Be Listed Below (If Applicable).     If you need a refill on your cardiac medications before your next appointment, please call your pharmacy.

## 2015-08-05 ENCOUNTER — Ambulatory Visit (INDEPENDENT_AMBULATORY_CARE_PROVIDER_SITE_OTHER)
Admission: RE | Admit: 2015-08-05 | Discharge: 2015-08-05 | Disposition: A | Discharge status: Home / Self Care | Payer: Medicare Other | Source: Ambulatory Visit | Attending: Family Medicine | Admitting: Family Medicine

## 2015-08-05 ENCOUNTER — Ambulatory Visit (INDEPENDENT_AMBULATORY_CARE_PROVIDER_SITE_OTHER): Payer: Medicare Other | Admitting: Family Medicine

## 2015-08-05 ENCOUNTER — Encounter: Payer: Self-pay | Admitting: Family Medicine

## 2015-08-05 ENCOUNTER — Other Ambulatory Visit: Payer: Medicare Other

## 2015-08-05 VITALS — BP 138/78 | HR 83 | Temp 98.6°F | Ht 67.0 in | Wt 263.2 lb

## 2015-08-05 DIAGNOSIS — M79671 Pain in right foot: Secondary | ICD-10-CM | POA: Diagnosis not present

## 2015-08-05 DIAGNOSIS — M7521 Bicipital tendinitis, right shoulder: Secondary | ICD-10-CM

## 2015-08-05 DIAGNOSIS — M7551 Bursitis of right shoulder: Secondary | ICD-10-CM | POA: Diagnosis not present

## 2015-08-05 DIAGNOSIS — M7541 Impingement syndrome of right shoulder: Secondary | ICD-10-CM | POA: Diagnosis not present

## 2015-08-05 NOTE — Progress Notes (Signed)
Dr. Frederico Hamman T. Breniyah Romm, MD, [facility] [place], [address] Phone: [phone] Fax: [phone]  08/05/2015  Patient: Monica [name], MRN: [id number], DOB: 10/18/1935, 80 y.o.  Primary Physician:  Loura Pardon, MD   Chief Complaint  Patient presents with  . Arm Pain    Right  . Foot Pain    Right  . Sciatica   Subjective:   Monica [name] is a 80 y.o. very pleasant female patient who presents with the following:  Pleasant patient with BMI of 41 who presents with multiple complaints.  She has recently started working out with a Clinical research associate, she's been doing a lot of exercises directed by the trainer, and additionally she is been doing weight training at home with dumbbells.  She is been doing a lot of overhead presses, abduction, and a lot of biceps curls.  R arm pain x 1 week  Sciatica and pain in her buttocks. Long-standing issue.  Sleeps on her right side. Uses her R arm to raise up. Leans on the threshhold - does it every time. Uses for support. Picking up some soil and water. No crepitus. From her repetitive stance, she has been doing overhead presses at the age of 66 repetitively recently and her goal to get fit.  She also has been doing some more planting compared to baseline.  She has no crepitus.  No prior trauma or injury.  No prior surgery.  Right foot swelling, this is all in the forefoot predominantly.  She has some pain here on the right forefoot compared to left.  Past Medical History, Surgical History, Social History, Family History, Problem List, Medications, and Allergies have been reviewed and updated if relevant.  Patient Active Problem List   Diagnosis Date Noted  . Estrogen deficiency 02/11/2015  . Medicare annual wellness visit, initial 02/11/2015  . Routine general medical examination at a health care facility 02/11/2015  . Compulsive overeating 01/01/2015  . Family history of breast cancer  02/28/2014  . Venous stasis dermatitis 02/27/2013  . Diffuse cystic mastopathy 02/11/2013  . Chronic diastolic heart failure (HCC) 12/12/2012    Class: Chronic  . Intertrigo 11/27/2012  . Lump or mass in breast   . Abdominal pain, chronic, right lower quadrant 02/23/2012  . Urge incontinence of urine 09/09/2011  . Rash and nonspecific skin eruption 08/08/2011  . OSTEOARTHRITIS, KNEES, BILATERAL 02/09/2010  . GOUT, UNSPECIFIED 03/20/2008  . Fasting hyperglycemia 02/27/2008  . Hyperlipidemia LDL goal <100 12/21/2006  . Morbid obesity ([place]) 12/21/2006  . Adjustment disorder with mixed anxiety and depressed mood 12/21/2006  . Depression with anxiety 12/21/2006  . Essential hypertension 12/21/2006  . Allergic rhinitis, cause unspecified 12/21/2006  . SCIATICA 12/21/2006  . Sleep apnea 12/21/2006  . EDEMA 12/21/2006  . OVEREATING 12/21/2006    Past Medical History  Diagnosis Date  . Allergic rhinitis   . Anxiety   . Depression   . Hyperlipidemia   . HTN (hypertension)   . Obesity   . Compulsive overeating   . OA (osteoarthritis) of knee     with injections  . Gout   . Cardiomegaly   . Edema   . Other abnormal glucose   . Sciatica   . Unspecified sleep apnea   . Benign neoplasm of breast 2013  . Personal history of tobacco use, presenting hazards to health   . Diffuse cystic mastopathy 2013  . Sinus infection 2010  . Lump or mass in  breast 2012  . Family history of malignant neoplasm of breast     Past Surgical History  Procedure Laterality Date  . Breast lumpectomy      right x1 ('89) left x2 ('70s, '90)  . Colonoscopy  11/02    diverticulosis  . US transvaginal pelvic modified  2000, 2001  . Dexa  3/02    normal  . Sleep study  5/05  . Colonoscopy  9/06    diverticulosis, hemorhoids  . Adenosine cardiolite  1/08    low risk   . Knee replacement Right 9/11    R. Dr. Telford Nab  . Cardiac catheterization  2001    normal per pt  . Total abdominal hysterectomy       fibroids, age of 44  . Appendectomy    . Tonsillectomy    . Eye surgery  2012    cataract  . Total knee arthroplasty Left 2012    Social History   Social History  . Marital Status: Widowed    Spouse Name: N/A  . Number of Children: 1  . Years of Education: N/A   Occupational History  . Retired school principal    Social History Main Topics  . Smoking status: Former Smoker -- 0.50 packs/day for 20 years    Types: Cigarettes  . Smokeless tobacco: Never Used     Comment: quit approx. 20 years ago  . Alcohol Use: 0.0 oz/week    0 Standard drinks or equivalent per week     Comment: wine occasional  . Drug Use: No  . Sexual Activity: No   Other Topics Concern  . Not on file   Social History Narrative   Widowed, 1 daughter. 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 Gout      whole family   . Stroke Father   . Hypertension Mother     (a lot of animosity in their relationship)  . Heart failure Mother   . Prostate cancer Brother   . Hepatitis Brother     Hep C; alcoholism (terminal)  . Breast cancer Sister   . Other Brother     drug addiction    Allergies  Allergen Reactions  . Buspirone Hcl     REACTION: made her sleepy, ? swollen legs  . Lisinopril     REACTION: dizziness  . Prozac [Fluoxetine Hcl]     sleepy    Medication list reviewed and updated in full in [place].  GEN: No fevers, chills. Nontoxic. Primarily MSK c/o today. MSK: Detailed in the HPI GI: tolerating PO intake without difficulty Neuro: No numbness, parasthesias, or tingling associated. Otherwise the pertinent positives of the ROS are noted above.   Objective:   BP 138/78 mmHg  Pulse 83  Temp(Src) 98.6 F (37 C) (Oral)  Ht 5\' 7"  (123XX123 m)  Wt 263 lb 4 oz (119.409 kg)  BMI 41.22 kg/m2   GEN: Well-developed,well-nourished,in no acute distress; alert,appropriate and cooperative throughout  examination HEENT: Normocephalic and atraumatic without obvious abnormalities. Ears, externally no deformities PULM: Breathing comfortably in no respiratory distress EXT: No clubbing, cyanosis, or edema PSYCH: Normally interactive. Cooperative during the interview. Pleasant. Friendly and conversant. Not anxious or depressed appearing. Normal, full affect.  Shoulder: R Inspection: No muscle wasting or winging Ecchymosis/edema: neg  AC joint, scapula, clavicle: NT Cervical spine: NT, full ROM Spurling's:  neg Abduction: full, 5/5 Flexion: full, 5/5 IR, full, lift-off: 5/5 ER at neutral: full, 5/5 AC crossover: neg Neer: pos Hawkins: pos Drop Test: neg Empty Can: pos Supraspinatus insertion: mild-mod T Bicipital groove: NT Speed's: POS Yergason's: POS Sulcus sign: neg Scapular dyskinesis: none C5-T1 intact  Neuro: Sensation intact Grip 5/5   Additional some swelling and edema in the forefoot on the right.  She has no significant pain at the ankle, medial and lateral malleoli.  No pain in the talus.  Cuboid and navicular nontender.  Achilles and posterior tibial and peroneal tendons are nontender.  Chest some pain, more in the region of 2 through 4 on the metatarsal shafts.  She also has some pain in the MTP joints to a lesser extent  Radiology: Dg Foot Complete Right  08/05/2015  CLINICAL DATA:  Right foot pain.  No reported injury. EXAM: RIGHT FOOT COMPLETE - 3+ VIEW COMPARISON:  None. FINDINGS: Diffuse degenerative change. Degenerative changes are most prominent about the first metatarsophalangeal joint. Corticated erosion noted about the distal right first metatarsal. This may be from a prior erosive process such as gout. No other focal abnormality identified. IMPRESSION: Diffuse degenerative change, particularly prominent about the first metatarsal phalangeal joint. Old changes of gout about the first metatarsophalangeal joint cannot be excluded. No acute abnormality.  Electronically Signed   By: Marcello Moores  Register   On: 08/05/2015 17:14     Assessment and Plan:   Impingement syndrome of right shoulder  Foot pain, right - Plan: DG Foot Complete Right  Subacromial bursitis, right  Morbid obesity due to excess calories (HCC)  Biceps tendinitis, right  >25 minutes spent in face to face time with patient, >50% spent in counselling or coordination of care:  All overuse injuries in the setting of morbid obesity at age 89.  Repetitive overhead activities, repetitive overhead lifting, difficulty even at age 61, and at age 72 likely cause of her impingement, bursitis, and biceps pathology.  No trauma.  Recommended holding off overhead pressing for an indefinite time.  Back offer the next 1-2 weeks on biceps curls, and decreased volume.  We also discussed weight management.  Right forefoot pain.  No acute fracture.  Certainly, this could be in the range of a stress reaction given  Weight and age.  Recommended stiffness shoe, and also gave her a arch binder for additional support in the region.  Also recommended compression socks.  Follow-up: I suspect that most of the overuse issues will resolve promptly, but if she is having symptoms that persist beyond 3 weeks, recommended follow-up  Orders Placed This Encounter  Procedures  . DG Foot Complete Right    Signed,  Lilias Lorensen T. Keeshia Sanderlin, MD   Patient's Medications  New Prescriptions   No medications on file  Previous Medications   ACCU-CHEK AVIVA PLUS TEST STRIP    CHECK GLUCOSE WITH EACH MEAL AND AT BEDTIME AS DIRECTED   ACCU-CHEK SOFTCLIX LANCETS LANCETS    Check blood sugar once daily and as needed. Dx R73.01   ALLOPURINOL (ZYLOPRIM) 100 MG TABLET    Take 1 tablet (100 mg total) by mouth daily.   AMLODIPINE (NORVASC) 5 MG TABLET    Take 1 tablet (5 mg total) by mouth daily.   CALCIUM CARBONATE (OS-CAL) 600 MG TABS    Take 600 mg by mouth daily.    FUROSEMIDE (LASIX) 40 MG TABLET    TAKE 1 TABLET BY  MOUTH DAILY   LOSARTAN (COZAAR) 50 MG TABLET  Take 1 tablet (50 mg total) by mouth daily.   MAGNESIUM CARBONATE PO    Take 250 mg by mouth daily.   METOPROLOL SUCCINATE (TOPROL-XL) 100 MG 24 HR TABLET    Take 1 tablet (100 mg total) by mouth daily. *Needs to schedule physical appt before future refills are given*   OMEGA-3 FATTY ACIDS (FISH OIL PO)    Take by mouth.   POTASSIUM CHLORIDE SA (K-DUR,KLOR-CON) 20 MEQ TABLET    TAKE 1 TABLET BY MOUTH DAILY  Modified Medications   No medications on file  Discontinued Medications   OFLOXACIN (FLOXIN OTIC) 0.3 % OTIC SOLUTION    Place 5 drops into the right ear 2 (two) times daily. For 7 days.

## 2015-08-05 NOTE — Progress Notes (Signed)
Pre visit review using our clinic review tool, if applicable. No additional management support is needed unless otherwise documented below in the visit note. 

## 2015-08-12 ENCOUNTER — Ambulatory Visit: Payer: Medicare Other | Admitting: Family Medicine

## 2015-08-21 ENCOUNTER — Other Ambulatory Visit: Payer: Self-pay | Admitting: Family Medicine

## 2015-08-24 ENCOUNTER — Ambulatory Visit (HOSPITAL_BASED_OUTPATIENT_CLINIC_OR_DEPARTMENT_OTHER): Payer: Medicare Other | Attending: Interventional Cardiology | Admitting: Cardiology

## 2015-08-24 VITALS — Ht 67.0 in | Wt 262.0 lb

## 2015-08-24 DIAGNOSIS — R000 Tachycardia, unspecified: Secondary | ICD-10-CM | POA: Insufficient documentation

## 2015-08-24 DIAGNOSIS — R5383 Other fatigue: Secondary | ICD-10-CM | POA: Diagnosis not present

## 2015-08-24 DIAGNOSIS — G4733 Obstructive sleep apnea (adult) (pediatric): Secondary | ICD-10-CM | POA: Insufficient documentation

## 2015-08-24 DIAGNOSIS — Z6841 Body Mass Index (BMI) 40.0 and over, adult: Secondary | ICD-10-CM | POA: Insufficient documentation

## 2015-08-24 DIAGNOSIS — E669 Obesity, unspecified: Secondary | ICD-10-CM | POA: Diagnosis not present

## 2015-08-24 DIAGNOSIS — R0683 Snoring: Secondary | ICD-10-CM | POA: Insufficient documentation

## 2015-08-24 DIAGNOSIS — I491 Atrial premature depolarization: Secondary | ICD-10-CM | POA: Diagnosis not present

## 2015-08-24 DIAGNOSIS — I493 Ventricular premature depolarization: Secondary | ICD-10-CM | POA: Insufficient documentation

## 2015-08-27 ENCOUNTER — Telehealth: Payer: Self-pay | Admitting: Family Medicine

## 2015-08-27 NOTE — Telephone Encounter (Signed)
Pt is requesting a transfer in care from Dr. Glori Bickers to Dr. Damita Dunnings. She needs a different doctor for a change. Is it OK to transfer?

## 2015-08-27 NOTE — Telephone Encounter (Signed)
That will be up to Dr Damita Dunnings (I sent him a flag as well- he should review her chart first).

## 2015-08-29 NOTE — Telephone Encounter (Signed)
I declined to accept this transfer.  Thanks.

## 2015-08-31 NOTE — Telephone Encounter (Signed)
Spoke with patient and let her know that Dr. Damita Dunnings declined the transfer.   She understood / lt

## 2015-09-10 ENCOUNTER — Encounter: Payer: Self-pay | Admitting: Family Medicine

## 2015-09-10 ENCOUNTER — Ambulatory Visit (INDEPENDENT_AMBULATORY_CARE_PROVIDER_SITE_OTHER): Payer: Medicare Other | Admitting: Family Medicine

## 2015-09-10 ENCOUNTER — Other Ambulatory Visit: Payer: Self-pay

## 2015-09-10 VITALS — BP 160/90 | HR 87 | Ht 67.0 in | Wt 260.0 lb

## 2015-09-10 DIAGNOSIS — M25511 Pain in right shoulder: Secondary | ICD-10-CM | POA: Diagnosis not present

## 2015-09-10 DIAGNOSIS — M79671 Pain in right foot: Secondary | ICD-10-CM

## 2015-09-10 DIAGNOSIS — M7918 Myalgia, other site: Secondary | ICD-10-CM

## 2015-09-10 DIAGNOSIS — I10 Essential (primary) hypertension: Secondary | ICD-10-CM | POA: Diagnosis not present

## 2015-09-10 DIAGNOSIS — M791 Myalgia: Secondary | ICD-10-CM

## 2015-09-10 MED ORDER — ALLOPURINOL 100 MG PO TABS: 100.0000 mg | ORAL_TABLET | Freq: Every day | ORAL | Status: DC

## 2015-09-10 MED ORDER — LOSARTAN POTASSIUM 50 MG PO TABS: 50.0000 mg | ORAL_TABLET | Freq: Every day | ORAL | Status: DC

## 2015-09-10 MED ORDER — FUROSEMIDE 40 MG PO TABS: 40.0000 mg | ORAL_TABLET | Freq: Every day | ORAL | Status: DC

## 2015-09-10 MED ORDER — METOPROLOL SUCCINATE ER 100 MG PO TB24: 100.0000 mg | ORAL_TABLET | Freq: Every day | ORAL | Status: DC

## 2015-09-10 NOTE — Patient Instructions (Signed)
Take your medications as prescribed.  We will be in touch regarding your appts (PT and sports medicine).  Take care  Dr. Lacinda Axon

## 2015-09-11 DIAGNOSIS — M25519 Pain in unspecified shoulder: Secondary | ICD-10-CM | POA: Insufficient documentation

## 2015-09-11 NOTE — Assessment & Plan Note (Signed)
Patient out of medication. Refilled today.

## 2015-09-11 NOTE — Assessment & Plan Note (Signed)
Established problem, worsening/persistent. Unclear etiology. Likely exacerbated by morbid obesity and poor footwear. Recommended PT.

## 2015-09-11 NOTE — Assessment & Plan Note (Signed)
Established problem, worsening. Exam unrevealing today. Sending to sports medicine for ultrasound. Also sending to physical therapy.

## 2015-09-11 NOTE — Progress Notes (Signed)
Subjective:  Patient ID: Monica [name], female    DOB: 09/05/1935  Age: 80 y.o. MRN: [id number]  CC: Pain  HPI:  80 year old female with a past medical history of diastolic heart, hyperlipidemia, hypertension, morbid obesity, noncompliance presents with complaints of pain.  Patient reports that she's been experiencing shoulder pain, right foot pain, and right buttock pain for the past few months. She was recently seen for these complaints by Dr. Lorelei Pont. At that visit in June her symptoms were treated to overuse that she was exercising with a personal trainer and doing a lot of activity.  Patient presents today with continued complaints of pain. She reports her shoulder pain is quite severe. Pain is located laterally. Intermittent and periodically worse with certain activities. She also reports right foot pain particularly on the dorsum of her right foot. No recent fall, trauma, injury. And lastly she reports right buttock pain. No reports or radiation down the legs. No known relieving factors.  Social Hx   Social History   Social History  . Marital Status: Widowed    Spouse Name: N/A  . Number of Children: 1  . Years of Education: N/A   Occupational History  . Retired school principal    Social History Main Topics  . Smoking status: Former Smoker -- 0.50 packs/day for 20 years    Types: Cigarettes  . Smokeless tobacco: Never Used     Comment: quit approx. 20 years ago  . Alcohol Use: 0.0 oz/week    0 Standard drinks or equivalent per week     Comment: wine occasional  . Drug Use: No  . Sexual Activity: No   Other Topics Concern  . None   Social History Narrative   Widowed, 1 daughter. 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   Musculoskeletal: Positive for myalgias and arthralgias.   Objective:  BP 160/90 mmHg  Pulse 87  Ht 5\' 7"  (1.702 m)  Wt 260 lb (117.935 kg)  BMI 40.71 kg/m2  SpO2  96%  BP/Weight 09/10/2015 08/24/2015 [id number]  Systolic BP 0000000 - 0000000  Diastolic BP 90 - 78  Wt. (Lbs) 260 262 263.25  BMI 40.71 41.03 41.22   Physical Exam  Constitutional: She appears well-developed. No distress.  Morbidly obese.  Cardiovascular: Normal rate.   Periods of irregularity secondary to ectopy.  Pulmonary/Chest: Effort normal. She has no wheezes. She has no rales.  Musculoskeletal:  Shoulder: Right Inspection reveals no abnormalities, atrophy or asymmetry. Palpation - tenderness over the lateral shoulder/deltoid. ROM is full in all planes. Rotator cuff strength normal throughout. No signs of impingement with negative Neer and Hawkin's tests, empty can.  Right foot - the dorsum of the foot as well as the medial midfoot tender palpation.   Neurological: She is alert.  Vitals reviewed.   Lab Results  Component Value Date   WBC 6.7 02/06/2015   HGB 13.3 02/06/2015   HCT 41.5 02/06/2015   PLT 236.0 02/06/2015   GLUCOSE 109* 02/06/2015   CHOL 187 02/06/2015   TRIG 96.0 02/06/2015   HDL 41.40 02/06/2015   LDLDIRECT 131.8 12/09/2009   LDLCALC 126* 02/06/2015   ALT 39* 02/06/2015   AST 31 02/06/2015   NA 143 02/06/2015   K 3.6 02/06/2015   CL 105 02/06/2015   CREATININE 0.75 02/06/2015   BUN 16 02/06/2015   CO2 30 02/06/2015   TSH 1.71 02/06/2015  INR 1.02 04/23/2010   HGBA1C 6.1 12/31/2014   Assessment & Plan:   Problem List Items Addressed This Visit    Essential hypertension    Patient out of medication. Refilled today.      Right shoulder pain - Primary    Established problem, worsening. Exam unrevealing today. Sending to sports medicine for ultrasound. Also sending to physical therapy.      Relevant Orders   Ambulatory referral to Sports Medicine   Ambulatory referral to Physical Therapy   Right foot pain    Established problem, worsening/persistent. Unclear etiology. Likely exacerbated by morbid obesity and poor footwear. Recommended  PT.      Relevant Orders   Ambulatory referral to Sports Medicine   Ambulatory referral to Physical Therapy   Buttock pain    Likely from the low back. Worsened in the setting of morbid obesity. Sending to physical therapy.      Relevant Orders   Ambulatory referral to Sports Medicine   Ambulatory referral to Physical Therapy     Follow-up: PRN  [place]

## 2015-09-11 NOTE — Assessment & Plan Note (Signed)
Likely from the low back. Worsened in the setting of morbid obesity. Sending to physical therapy.

## 2015-09-21 NOTE — Progress Notes (Signed)
[name] [place], [place] [address] Phone: [phone] Subjective:    I'm seeing this patient by the request  of:  [name], DO   CC: right shoulder pain    [id number]  Monica [name] is a 80 y.o. female coming in with complaint of Right shoulder pain. Patient discusses the pain as a dull, throbbing aching pain. Seems to come and go. Seems to be worse at night sometimes. Can wake her up. Seems to radiate somewhat down the arm but never to the hand. Denies any weakness but states that range of motion sometimes can be hindered but not all the time. Rates the severity of pain when it occurs as 9 out of 10. Sometimes she can No pain whatsoever. Patient has not tried any significant home modalities. Denies any associated neck pain.   Right foot xrays 08/05/15.  DJD with erosions. (Hx of elevated uric acid 5 years ago)  Past Medical History:  Diagnosis Date  . Allergic rhinitis   . Anxiety   . Benign neoplasm of breast 2013  . Cardiomegaly   . Compulsive overeating   . Depression   . Diffuse cystic mastopathy 2013  . Edema   . Family history of malignant neoplasm of breast   . Gout   . HTN (hypertension)   . Hyperlipidemia   . Lump or mass in breast 2012  . OA (osteoarthritis) of knee    with injections  . Obesity   . Other abnormal glucose   . Personal history of tobacco use, presenting hazards to health   . Sciatica   . Sinus infection 2010  . Unspecified sleep apnea    Past Surgical History:  Procedure Laterality Date  . adenosine cardiolite  1/08   low risk   . APPENDECTOMY    . BREAST LUMPECTOMY     right x1 ('89) left x2 ('70s, '90)  . CARDIAC CATHETERIZATION  2001   normal per pt  . COLONOSCOPY  11/02   diverticulosis  . COLONOSCOPY  9/06   diverticulosis, hemorhoids  . dexa  3/02   normal  . EYE SURGERY  2012   cataract  . knee replacement Right 9/11   R. Dr. Telford Nab  . sleep study  5/05  .  TONSILLECTOMY    . TOTAL ABDOMINAL HYSTERECTOMY     fibroids, age of 30  . TOTAL KNEE ARTHROPLASTY Left 2012  . US TRANSVAGINAL PELVIC MODIFIED  2000, 2001   Social History   Social History  . Marital status: Widowed    Spouse name: N/A  . Number of children: 1  . Years of education: N/A   Occupational History  . Retired school principal Retired   Social History Main Topics  . Smoking status: Former Smoker    Packs/day: 0.50    Years: 20.00    Types: Cigarettes  . Smokeless tobacco: Never Used     Comment: quit approx. 20 years ago  . Alcohol use 0.0 oz/week     Comment: wine occasional  . Drug use: No  . Sexual activity: No   Other Topics Concern  . None   Social History Narrative   Widowed, 1 daughter. 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 Buspirone Hcl     REACTION: made her sleepy, ? swollen legs  . Lisinopril     REACTION: dizziness  .  Prozac [Fluoxetine Hcl]     sleepy   Family History  Problem Relation Age of Onset  . Gout      whole family   . Stroke Father   . Hypertension Mother     (a lot of animosity in their relationship)  . Heart failure Mother   . Prostate cancer Brother   . Hepatitis Brother     Hep C; alcoholism (terminal)  . Breast cancer Sister   . Other Brother     drug addiction    Past medical history, social, surgical and family history all reviewed in electronic medical record.  No pertanent information unless stated regarding to the chief complaint.   Review of Systems: No headache, visual changes, nausea, vomiting, diarrhea, constipation, dizziness, abdominal pain, skin rash, fevers, chills, night sweats, weight loss, swollen lymph nodes,  chest pain, shortness of breath, mood changes.   Objective  Blood pressure (!) 142/86, pulse 80, weight 260 lb (117.9 kg), SpO2 94 %.  General: No apparent distress alert and oriented x3 mood and affect normal, dressed  appropriately.  HEENT: Pupils equal, extraocular movements intact  Respiratory: Patient's speak in full sentences and does not appear short of breath  Cardiovascular: tracelower extremity edema, non tender, no erythema  Skin: Warm dry intact with no signs of infection or rash on extremities or on axial skeleton.  Abdomen: Soft nontender  Neuro: Cranial nerves II through XII are intact, neurovascularly intact in all extremities with 2+ DTRs and 2+ pulses.  Lymph: No lymphadenopathy of posterior or anterior cervical chain or axillae bilaterally.  Gait antalgic gait MSK:  Non tender with full range of motion and good stability and symmetric strength and tone of  elbows, wrist, hip, knee and ankles bilaterally.  Shoulder: Right Inspection reveals no abnormalities, atrophy or asymmetry. Palpation is normal with no tenderness over AC joint or bicipital groove. ROM is full in all planes passively. Rotator cuff strength normal throughout. signs of impingement with positive Neer and Hawkin's tests, but negative empty can sign. Speeds and Yergason's tests normal. No labral pathology noted with negative Obrien's, negative clunk and good stability. Normal scapular function observed. No painful arc and no drop arm sign. No apprehension sign  MSK US performed of: Right This study was ordered, performed, and interpreted by Charlann Boxer D.O.  Shoulder:   Supraspinatus: Degenerative tear noted. No significant retraction. Mild underlying arthritic changes Infraspinatus:  Appears normal on long and transverse views. Significant increase in Doppler flow Subscapularis:  Degenerative tearing noted Teres Minor:  Appears normal on long and transverse views. AC joint:  Moderate arthritis Glenohumeral Joint:  Appears normal without effusion. Glenoid Labrum:  Intact without visualized tears. Biceps Tendon:  Appears normal on long and transverse views, no fraying of tendon, tendon located in intertubercular  groove, no subluxation with shoulder internal or external rotation.  Impression: degenerative RTC with mild to moderate OA.        Impression and Recommendations:     This case required medical decision making of moderate complexity.      Note: This dictation was prepared with Dragon dictation along with smaller phrase technology. Any transcriptional errors that result from this process are unintentional.

## 2015-09-22 ENCOUNTER — Encounter: Payer: Self-pay | Admitting: Family Medicine

## 2015-09-22 ENCOUNTER — Other Ambulatory Visit: Payer: Self-pay

## 2015-09-22 ENCOUNTER — Ambulatory Visit (INDEPENDENT_AMBULATORY_CARE_PROVIDER_SITE_OTHER): Payer: Medicare Other | Admitting: Family Medicine

## 2015-09-22 VITALS — BP 142/86 | HR 80 | Wt 260.0 lb

## 2015-09-22 DIAGNOSIS — M25511 Pain in right shoulder: Secondary | ICD-10-CM | POA: Diagnosis not present

## 2015-09-22 DIAGNOSIS — M75111 Incomplete rotator cuff tear or rupture of right shoulder, not specified as traumatic: Secondary | ICD-10-CM | POA: Diagnosis not present

## 2015-09-22 DIAGNOSIS — M109 Gout, unspecified: Secondary | ICD-10-CM

## 2015-09-22 HISTORY — DX: Incomplete rotator cuff tear or rupture of right shoulder, not specified as traumatic: M75.111

## 2015-09-22 MED ORDER — ALLOPURINOL 100 MG PO TABS: 200.0000 mg | ORAL_TABLET | Freq: Every day | ORAL | 4 refills | Status: DC

## 2015-09-22 NOTE — Assessment & Plan Note (Signed)
Patient increased on allopurinol with patient having x-rays of foot recently showing erosive changes. Kidney function is doing well.

## 2015-09-22 NOTE — Patient Instructions (Addendum)
Good to see you.  Ice 20 minutes 2 times daily. Usually after activity and before bed. Exercises 3 times a week.  Allopurinol now 200mg  daily .  That is 2 of the pills you have.  pennsaid pinkie amount topically 2 times daily as needed.  Vitamin D 2000IUdaily  Turmeric 500mg  daily  Tart cherry extract at night Keep hands within peripheral vision  See me again in 4 weeks to make sure you are doing well.

## 2015-09-22 NOTE — Assessment & Plan Note (Signed)
Patient does have what appears to be a partial tear of the rotator cuff. Does not have any significant retraction. Mild underlying arthritic changes. Nothing significant. Patient even home exercises, icing protocol, we discussed topical anti-inflammatory's. Discussed over-the-counter medications. I do think the patient will do well with conservative therapy. Patient come back again in 4 weeks. Because the patient's history of gout we did increase patient's allopurinol to 200 mg with history of erosion seen on previous x-rays of foot. Patient also will do some over-the-counter medications. Once again patient come back in 4 weeks.

## 2015-09-23 NOTE — Procedures (Signed)
   Patient Name: Monica [name], Monica [name] Date: 08/24/2015 Gender: Female D.O.B: Jun 22, 1935 Age (years): 79 Referring Provider: Daneen Schick Height (inches): 64 Interpreting Physician: Fransico Him MD, ABSM Weight (lbs): 262 RPSGT: Baxter Flattery BMI: 41 MRN: [id number] Neck Size: 17.00  CLINICAL INFORMATION Sleep Study Type: NPSG Indication for sleep study: Fatigue, Obesity, OSA, Snoring, Witnessed Apneas Epworth Sleepiness Score: 14  SLEEP STUDY TECHNIQUE As per the AASM Manual for the Scoring of Sleep and Associated Events v2.3 (April 2016) with a hypopnea requiring 4% desaturations. The channels recorded and monitored were frontal, central and occipital EEG, electrooculogram (EOG), submentalis EMG (chin), nasal and oral airflow, thoracic and abdominal wall motion, anterior tibialis EMG, snore microphone, electrocardiogram, and pulse oximetry.  MEDICATIONS Patient's medications include: Reviewed in the chart. Medications self-administered by patient during sleep study : No sleep medicine administered.  SLEEP ARCHITECTURE The study was initiated at 10:09:04 PM and ended at 5:00:48 AM. Sleep onset time was 33.1 minutes and the sleep efficiency was markedly reduced at 39.5%. The total sleep time was 162.5 minutes. Stage REM latency was N/A minutes. The patient spent 10.15% of the night in stage N1 sleep, 89.85% in stage N2 sleep, 0.00% in stage N3 and 0.00% in REM. Alpha intrusion was absent. Supine sleep was 0.00%.  RESPIRATORY PARAMETERS The overall apnea/hypopnea index (AHI) was 8.5 per hour. There were 1 total apneas, including 1 obstructive, 0 central and 0 mixed apneas. There were 22 hypopneas and 0 RERAs. The AHI during Stage REM sleep was N/A per hour. AHI while supine was N/A per hour. The mean oxygen saturation was 91.62%. The minimum SpO2 during sleep was 82.00%. Loud snoring was noted during this study.  CARDIAC DATA The 2 lead EKG demonstrated sinus rhythm. The  mean heart rate was 61.43 beats per minute. Other EKG findings include: Frequent PAC's, nonsustained atrial tachycardia up to 4 beats and PVCs.  LEG MOVEMENT DATA The total PLMS were 124 with a resulting PLMS index of 45.78. Associated arousal with leg movement index was 0.7 .  IMPRESSIONS - Mild obstructive sleep apnea occurred during this study (AHI = 8.5/h) but patient had very little sleep time and no REM sleep. - No significant central sleep apnea occurred during this study (CAI = 0.0/h). - Mild oxygen desaturation was noted during this study (Min O2 = 82.00%). - The patient snored with Loud snoring volume. - Frequent PACs, nonsustained atriral tachycardia and PVCs were noted during this study. - Moderate periodic limb movements of sleep occurred during the study. No significant associated arousals.  DIAGNOSIS - Obstructive Sleep Apnea (327.23 [G47.33 ICD-10])  RECOMMENDATIONS - Patient had very mild OSA during the study but had very little sleep time and no REM sleep.  Recommend repeat split night study with sleep aide to determine the severity of OSA to help guide appropriate therapy.  - Avoid alcohol, sedatives and other CNS depressants that may worsen sleep apnea and disrupt normal sleep architecture. - Sleep hygiene should be reviewed to assess factors that may improve sleep quality. - Weight management and regular exercise should be initiated or continued if appropriate.   [place], American Board of Sleep Medicine  ELECTRONICALLY SIGNED ON:  09/23/2015, 12:51 PM [place] PH: [phone]   FX: [phone] [place]

## 2015-09-29 NOTE — Progress Notes (Signed)
Lunesta 2mg  (#1 tablet with no refills) to be taken on arrival at sleep lab

## 2015-09-30 ENCOUNTER — Other Ambulatory Visit: Payer: Self-pay

## 2015-09-30 DIAGNOSIS — R0683 Snoring: Secondary | ICD-10-CM

## 2015-09-30 NOTE — Progress Notes (Signed)
Spoke with patient and advised of sleep study results and Dr. Theodosia Blender recommendations. Patient is willing to have sleep study repeated with sleep aide. Patient had no questions. Prescription for Lunesta 2 mg:1 tablet by mouth upon arrival at sleep lab. NO REFILLS was called into [facility].

## 2015-10-19 NOTE — Progress Notes (Signed)
[name] [place], [place] [address] Phone: [phone] Subjective:    CC: right shoulder pain  Follow-up  [id number]  Monica [name] is a 80 y.o. female coming in with complaint of Right shoulder pain. Had to have osteophytic changes as well as gouty deposits. Patient states that it does seem to be associated with some of the dye. Has pain that seems to be more intermittent. Some range of motion can be difficult. Denies any significant radiation down the arm or any numbness or tingling. States that he can continue to wake her up at night. Has not been doing the home exercises and has been fairly noncompliant with the over-the-counter medications as well.   Right foot xrays 08/05/15.  DJD with erosions. (Hx of elevated uric acid 5 years ago)  Past Medical History:  Diagnosis Date  . Allergic rhinitis   . Anxiety   . Benign neoplasm of breast 2013  . Cardiomegaly   . Compulsive overeating   . Depression   . Diffuse cystic mastopathy 2013  . Edema   . Family history of malignant neoplasm of breast   . Gout   . HTN (hypertension)   . Hyperlipidemia   . Lump or mass in breast 2012  . OA (osteoarthritis) of knee    with injections  . Obesity   . Other abnormal glucose   . Personal history of tobacco use, presenting hazards to health   . Sciatica   . Sinus infection 2010  . Unspecified sleep apnea    Past Surgical History:  Procedure Laterality Date  . adenosine cardiolite  1/08   low risk   . APPENDECTOMY    . BREAST LUMPECTOMY     right x1 ('89) left x2 ('70s, '90)  . CARDIAC CATHETERIZATION  2001   normal per pt  . COLONOSCOPY  11/02   diverticulosis  . COLONOSCOPY  9/06   diverticulosis, hemorhoids  . dexa  3/02   normal  . EYE SURGERY  2012   cataract  . knee replacement Right 9/11   R. Dr. Telford Nab  . sleep study  5/05  . TONSILLECTOMY    . TOTAL ABDOMINAL HYSTERECTOMY     fibroids, age of 75  . TOTAL KNEE  ARTHROPLASTY Left 2012  . US TRANSVAGINAL PELVIC MODIFIED  2000, 2001   Social History   Social History  . Marital status: Widowed    Spouse name: N/A  . Number of children: 1  . Years of education: N/A   Occupational History  . Retired school principal Retired   Social History Main Topics  . Smoking status: Former Smoker    Packs/day: 0.50    Years: 20.00    Types: Cigarettes  . Smokeless tobacco: Never Used     Comment: quit approx. 20 years ago  . Alcohol use 0.0 oz/week     Comment: wine occasional  . Drug use: No  . Sexual activity: No   Other Topics Concern  . None   Social History Narrative   Widowed, 1 daughter. 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 Buspirone Hcl     REACTION: made her sleepy, ? swollen legs  . Lisinopril     REACTION: dizziness  . Prozac [Fluoxetine Hcl]     sleepy   Family History  Problem Relation Age of Onset  . Gout  whole family   . Stroke Father   . Hypertension Mother     (a lot of animosity in their relationship)  . Heart failure Mother   . Prostate cancer Brother   . Hepatitis Brother     Hep C; alcoholism (terminal)  . Breast cancer Sister   . Other Brother     drug addiction    Past medical history, social, surgical and family history all reviewed in electronic medical record.  No pertanent information unless stated regarding to the chief complaint.   Review of Systems: No headache, visual changes, nausea, vomiting, diarrhea, constipation, dizziness, abdominal pain, skin rash, fevers, chills, night sweats, weight loss, swollen lymph nodes,  chest pain, shortness of breath, mood changes.   Objective  Blood pressure 140/82, pulse 83, weight 259 lb (117.5 kg), SpO2 95 %.  General: No apparent distress alert and oriented x3 mood and affect normal, dressed appropriately.  HEENT: Pupils equal, extraocular movements intact  Respiratory: Patient's speak in  full sentences and does not appear short of breath  Cardiovascular: tracelower extremity edema, non tender, no erythema  Skin: Warm dry intact with no signs of infection or rash on extremities or on axial skeleton.  Abdomen: Soft nontender  Neuro: Cranial nerves II through XII are intact, neurovascularly intact in all extremities with 2+ DTRs and 2+ pulses.  Lymph: No lymphadenopathy of posterior or anterior cervical chain or axillae bilaterally.  Gait antalgic gait MSK:  Non tender with full range of motion and good stability and symmetric strength and tone of  elbows, wrist, hip, knee and ankles bilaterally.  Shoulder: Right Inspection reveals no abnormalities, atrophy or asymmetry. Palpation is normal with no tenderness over AC joint or bicipital groove. ROM is full in all planes passively. Rotator cuff strength normal throughout. signs of impingement with positive Neer and Hawkin's tests, but negative empty can sign. Speeds and Yergason's tests normal. No labral pathology noted with negative Obrien's, negative clunk and good stability. Normal scapular function observed. No painful arc and no drop arm sign. No apprehension sign Contralateral shoulder unremarkable No significant improvement from previous exam       Impression and Recommendations:     This case required medical decision making of moderate complexity.      Note: This dictation was prepared with Dragon dictation along with smaller phrase technology. Any transcriptional errors that result from this process are unintentional.

## 2015-10-20 ENCOUNTER — Ambulatory Visit (INDEPENDENT_AMBULATORY_CARE_PROVIDER_SITE_OTHER): Payer: Medicare Other | Admitting: Family Medicine

## 2015-10-20 ENCOUNTER — Encounter: Payer: Self-pay | Admitting: Family Medicine

## 2015-10-20 DIAGNOSIS — M109 Gout, unspecified: Secondary | ICD-10-CM

## 2015-10-20 DIAGNOSIS — M75111 Incomplete rotator cuff tear or rupture of right shoulder, not specified as traumatic: Secondary | ICD-10-CM | POA: Diagnosis not present

## 2015-10-20 MED ORDER — ALLOPURINOL 300 MG PO TABS: 300.0000 mg | ORAL_TABLET | Freq: Every day | ORAL | 1 refills | Status: DC

## 2015-10-20 NOTE — Assessment & Plan Note (Signed)
I do believe the patient's chronic joint pain is secondary to more of the gouty flares. Increase patient's allopurinol to 300 mg. Warned the potential side effect. We discussed icing regimen as well as diet modifications. Patient will start PT for balance and coordination as well as left shoulder pain. We discussed icing regimen. Follow-up again in 6 weeks.

## 2015-10-20 NOTE — Assessment & Plan Note (Signed)
Encourage weight loss. 

## 2015-10-20 NOTE — Patient Instructions (Addendum)
Good to see you  Allopurinol 300mg  daily  Diet in moderation  Ice is your friend COntinue the vitamin D and the taret cherry extract  [facility] will be calling you  See me agai in 6 weeks.

## 2015-10-20 NOTE — Assessment & Plan Note (Signed)
Partial tear with moderate osteoarthritic changes. Did not do home exercises on a regular basis. Will be sent to formal physical therapy. We discussed if worsening symptoms consider injection.

## 2015-11-05 ENCOUNTER — Ambulatory Visit (HOSPITAL_BASED_OUTPATIENT_CLINIC_OR_DEPARTMENT_OTHER): Payer: Medicare Other | Attending: Cardiology | Admitting: Cardiology

## 2015-11-05 VITALS — Ht 67.0 in | Wt 261.0 lb

## 2015-11-05 DIAGNOSIS — I493 Ventricular premature depolarization: Secondary | ICD-10-CM | POA: Diagnosis not present

## 2015-11-05 DIAGNOSIS — R0683 Snoring: Secondary | ICD-10-CM | POA: Diagnosis present

## 2015-11-05 DIAGNOSIS — G4736 Sleep related hypoventilation in conditions classified elsewhere: Secondary | ICD-10-CM | POA: Diagnosis not present

## 2015-11-05 DIAGNOSIS — G4733 Obstructive sleep apnea (adult) (pediatric): Secondary | ICD-10-CM | POA: Insufficient documentation

## 2015-11-05 DIAGNOSIS — I4891 Unspecified atrial fibrillation: Secondary | ICD-10-CM | POA: Insufficient documentation

## 2015-11-06 ENCOUNTER — Encounter: Payer: Self-pay | Admitting: Interventional Cardiology

## 2015-11-06 NOTE — Procedures (Signed)
   Patient Name: Monica [name], Monica [name] Date: 11/05/2015 Gender: Female D.O.B: 08-10-1935 Age (years): 60 Referring Provider: Fransico Him MD, ABSM Height (inches): 67 Interpreting Physician: Fransico Him MD, ABSM Weight (lbs): 262 RPSGT: Neeriemer, Holly BMI: 41 MRN: [id number] Neck Size: 17.00  CLINICAL INFORMATION Sleep Study Type: NPSG Indication for sleep study: Snoring Epworth Sleepiness Score: 14 Most recent polysomnogram dated 08/24/2015 revealed an AHI of 8.5/h and RDI of 8.5/h.  SLEEP STUDY TECHNIQUE As per the AASM Manual for the Scoring of Sleep and Associated Events v2.3 (April 2016) with a hypopnea requiring 4% desaturations. The channels recorded and monitored were frontal, central and occipital EEG, electrooculogram (EOG), submentalis EMG (chin), nasal and oral airflow, thoracic and abdominal wall motion, anterior tibialis EMG, snore microphone, electrocardiogram, and pulse oximetry.  MEDICATIONS Patient's medications include: Reviewed in the chart. Medications self-administered by patient during sleep study : No sleep medicine administered.  SLEEP ARCHITECTURE The study was initiated at 9:53:21 PM and ended at 4:38:19 AM. Sleep onset time was 2.4 minutes and the sleep efficiency was 89.1%. The total sleep time was 361.0 minutes. Stage REM latency was 95.5 minutes. The patient spent 8.03% of the night in stage N1 sleep, 82.13% in stage N2 sleep, 0.00% in stage N3 and 9.83% in REM. Alpha intrusion was absent. Supine sleep was 66.31%.  RESPIRATORY PARAMETERS The overall apnea/hypopnea index (AHI) was 10.0 per hour. There were 13 total apneas, including 13 obstructive, 0 central and 0 mixed apneas. There were 47 hypopneas and 31 RERAs. The AHI during Stage REM sleep was 54.1 per hour. AHI while supine was 10.8 per hour. The mean oxygen saturation was 92.61%. The minimum SpO2 during sleep was 73.00%. Moderate snoring was noted during this study.  CARDIAC  DATA The 2 lead EKG demonstrated sinus rhythm. The mean heart rate was 68.61 beats per minute. Other EKG findings include:  PVCs  LEG MOVEMENT DATA The total PLMS were 97 with a resulting PLMS index of 16.12. Associated arousal with leg movement index was 0.7   IMPRESSIONS - Mild obstructive sleep apnea occurred during this study (AHI = 10.0/h). - No significant central sleep apnea occurred during this study (CAI = 0.0/h). - Moderate oxygen desaturation was noted during this study (Min O2 = 73.00%). - The patient snored with Moderate snoring volume. - EKG findings include Atrial Fibrillation, PVCs. - Mild periodic limb movements of sleep occurred during the study. No significant associated arousals.  DIAGNOSIS - Obstructive Sleep Apnea (327.23 [G47.33 ICD-10]) - Nocturnal Hypoxemia (327.26 [G47.36 ICD-10])  RECOMMENDATIONS - Therapeutic CPAP titration to determine optimal pressure required to alleviate sleep disordered breathing. - Avoid alcohol, sedatives and other CNS depressants that may worsen sleep apnea and disrupt normal sleep architecture. - Sleep hygiene should be reviewed to assess factors that may improve sleep quality. - Weight management and regular exercise should be initiated or continued if appropriate [place], American Board of Sleep Medicine  ELECTRONICALLY SIGNED ON:  11/06/2015, 2:51 PM [place] PH: [phone]   FX: (336) [place]

## 2015-11-10 NOTE — Addendum Note (Signed)
Addended by: Katina Dung on: 11/10/2015 10:16 AM   Modules accepted: Orders

## 2015-11-10 NOTE — Progress Notes (Signed)
Patient has been informed of sleep study results and she is ready to proceed with titration.    Will schedule and mail letter with date.

## 2015-11-16 ENCOUNTER — Encounter (HOSPITAL_BASED_OUTPATIENT_CLINIC_OR_DEPARTMENT_OTHER): Payer: Medicare Other

## 2015-11-30 ENCOUNTER — Encounter: Payer: Self-pay | Admitting: *Deleted

## 2015-11-30 NOTE — Progress Notes (Signed)
[name] [place], [place] [address] Phone: [phone] Subjective:    CC: right shoulder pain  Follow-up  [id number]  Monica [name] is a 80 y.o. female coming in with complaint of Right shoulder pain. Had to have osteophytic changes as well as gouty deposits. Patient was having difficulty with being compliant previously. Patient was sent to formal physical therapy and Patient states that she never went. Patient has been noticing some mild increase and neck pain. Patient continues to have soreness of the right shoulder. States that both seem to be debilitating. Mild radiation to the hand. Denies any weakness or numbness.   Past Medical History:  Diagnosis Date  . Allergic rhinitis   . Anxiety   . Benign neoplasm of breast 2013  . Cardiomegaly   . Compulsive overeating   . Depression   . Diffuse cystic mastopathy 2013  . Edema   . Family history of malignant neoplasm of breast   . Gout   . HTN (hypertension)   . Hyperlipidemia   . Lump or mass in breast 2012  . OA (osteoarthritis) of knee    with injections  . Obesity   . Other abnormal glucose   . Personal history of tobacco use, presenting hazards to health   . Sciatica   . Sinus infection 2010  . Unspecified sleep apnea    Past Surgical History:  Procedure Laterality Date  . adenosine cardiolite  1/08   low risk   . APPENDECTOMY    . BREAST LUMPECTOMY     right x1 ('89) left x2 ('70s, '90)  . CARDIAC CATHETERIZATION  2001   normal per pt  . COLONOSCOPY  11/02   diverticulosis  . COLONOSCOPY  9/06   diverticulosis, hemorhoids  . dexa  3/02   normal  . EYE SURGERY  2012   cataract  . knee replacement Right 9/11   R. Dr. Telford Nab  . sleep study  5/05  . TONSILLECTOMY    . TOTAL ABDOMINAL HYSTERECTOMY     fibroids, age of 40  . TOTAL KNEE ARTHROPLASTY Left 2012  . US TRANSVAGINAL PELVIC MODIFIED  2000, 2001   Social History   Social History  . Marital  status: Widowed    Spouse name: N/A  . Number of children: 1  . Years of education: N/A   Occupational History  . Retired school principal Retired   Social History Main Topics  . Smoking status: Former Smoker    Packs/day: 0.50    Years: 20.00    Types: Cigarettes  . Smokeless tobacco: Never Used     Comment: quit approx. 20 years ago  . Alcohol use 0.0 oz/week     Comment: wine occasional  . Drug use: No  . Sexual activity: No   Other Topics Concern  . Not on file   Social History Narrative   Widowed, 1 daughter. 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 Buspirone Hcl     REACTION: made her sleepy, ? swollen legs  . Lisinopril     REACTION: dizziness  . Prozac [Fluoxetine Hcl]     sleepy   Family History  Problem Relation Age of Onset  . Gout      whole family   . Stroke Father   . Hypertension Mother     (a lot of animosity in their relationship)  .  Heart failure Mother   . Prostate cancer Brother   . Hepatitis Brother     Hep C; alcoholism (terminal)  . Breast cancer Sister   . Other Brother     drug addiction    Past medical history, social, surgical and family history all reviewed in electronic medical record.  No pertanent information unless stated regarding to the chief complaint.   Review of Systems: No headache, visual changes, nausea, vomiting, diarrhea, constipation, dizziness, abdominal pain, skin rash, fevers, chills, night sweats, weight loss, swollen lymph nodes,  chest pain, shortness of breath, mood changes.   Objective  There were no vitals taken for this visit.  General: No apparent distress alert and oriented x3 mood and affect normal, dressed appropriately.  HEENT: Pupils equal, extraocular movements intact  Respiratory: Patient's speak in full sentences and does not appear short of breath  Cardiovascular: tracelower extremity edema, non tender, no erythema  Skin: Warm dry  intact with no signs of infection or rash on extremities or on axial skeleton.  Abdomen: Soft nontender  Neuro: Cranial nerves II through XII are intact, neurovascularly intact in all extremities with 2+ DTRs and 2+ pulses.  Lymph: No lymphadenopathy of posterior or anterior cervical chain or axillae bilaterally.  Gait antalgic gait MSK:  Non tender with full range of motion and good stability and symmetric strength and tone of  elbows, wrist, hip, knee and ankles bilaterally.  Shoulder: Right Inspection reveals no abnormalities, atrophy or asymmetry. Palpation is normal with no tenderness over AC joint or bicipital groove. ROM is full in all planes passively. Mild crepitus noted Rotator cuff strength normal throughout. signs of impingement with positive Neer and Hawkin's tests, but negative empty can sign. Speeds and Yergason's tests normal. No labral pathology noted with negative Obrien's, negative clunk and good stability. Normal scapular function observed. No painful arc and no drop arm sign. No apprehension sign Contralateral shoulder unremarkable No significant improvement from previous exam  Neck: Inspection unremarkable. No palpable stepoffs. Positive Spurling's with radicular symptoms down the right arm. Next last 5 of extension and flexion. Grip strength and sensation normal in bilateral hands Strength good C4 to T1 distribution No sensory change to C4 to T1 Negative Hoffman sign bilaterally Reflexes normal       Impression and Recommendations:     This case required medical decision making of moderate complexity.      Note: This dictation was prepared with Dragon dictation along with smaller phrase technology. Any transcriptional errors that result from this process are unintentional.

## 2015-12-01 ENCOUNTER — Encounter: Payer: Self-pay | Admitting: Family Medicine

## 2015-12-01 ENCOUNTER — Ambulatory Visit (INDEPENDENT_AMBULATORY_CARE_PROVIDER_SITE_OTHER): Payer: Medicare Other | Admitting: Family Medicine

## 2015-12-01 VITALS — BP 134/82 | HR 73 | Wt 258.0 lb

## 2015-12-01 DIAGNOSIS — M5010 Cervical disc disorder with radiculopathy, unspecified cervical region: Secondary | ICD-10-CM | POA: Diagnosis not present

## 2015-12-01 DIAGNOSIS — M542 Cervicalgia: Secondary | ICD-10-CM | POA: Diagnosis not present

## 2015-12-01 DIAGNOSIS — M75111 Incomplete rotator cuff tear or rupture of right shoulder, not specified as traumatic: Secondary | ICD-10-CM

## 2015-12-01 MED ORDER — GABAPENTIN 100 MG PO CAPS: 200.0000 mg | ORAL_CAPSULE | Freq: Every day | ORAL | 3 refills | Status: DC

## 2015-12-01 NOTE — Assessment & Plan Note (Signed)
Continue medication.

## 2015-12-01 NOTE — Assessment & Plan Note (Signed)
Patient does have some cervical radiculopathy but does have known degenerative tearing of the rotator cuff as well. Difficult to assess which one is causing him. Declined any type of injection in the shoulder today. Patient encouraged to continue the gout medication in case is plain a role. Sent to physical therapy. X-rays pending of the neck. Depending on findings this could change medical management. Started on a low dose of gabapentin to take at night that I hope will be beneficial. Follow-up again with me in 3-4 weeks. If worsening symptoms or any weakness or numbness possible advance imaging would be warranted.

## 2015-12-01 NOTE — Assessment & Plan Note (Signed)
Discussed injection which patient declined. We'll start in formal physical therapy.

## 2015-12-01 NOTE — Patient Instructions (Addendum)
Good to see you  Ice the neck  Xray of neck downstairs today I will send message in my chart  [facility].  [facility] and have good results. .  Gabapentin 200mg  at night I think will do the trick. See me again in 4 weeks to make sure it is completely gone or  A lot better

## 2015-12-03 ENCOUNTER — Ambulatory Visit (INDEPENDENT_AMBULATORY_CARE_PROVIDER_SITE_OTHER): Payer: Medicare Other

## 2015-12-03 ENCOUNTER — Ambulatory Visit (INDEPENDENT_AMBULATORY_CARE_PROVIDER_SITE_OTHER)
Admission: RE | Admit: 2015-12-03 | Discharge: 2015-12-03 | Disposition: A | Discharge status: Home / Self Care | Payer: Medicare Other | Source: Ambulatory Visit | Attending: Family Medicine | Admitting: Family Medicine

## 2015-12-03 DIAGNOSIS — M542 Cervicalgia: Secondary | ICD-10-CM

## 2015-12-03 DIAGNOSIS — Z23 Encounter for immunization: Secondary | ICD-10-CM

## 2015-12-09 ENCOUNTER — Other Ambulatory Visit: Payer: Self-pay | Admitting: *Deleted

## 2015-12-09 DIAGNOSIS — M75111 Incomplete rotator cuff tear or rupture of right shoulder, not specified as traumatic: Secondary | ICD-10-CM

## 2015-12-09 DIAGNOSIS — M5010 Cervical disc disorder with radiculopathy, unspecified cervical region: Secondary | ICD-10-CM

## 2015-12-29 ENCOUNTER — Ambulatory Visit (INDEPENDENT_AMBULATORY_CARE_PROVIDER_SITE_OTHER): Payer: Medicare Other | Admitting: Family Medicine

## 2015-12-29 ENCOUNTER — Encounter: Payer: Self-pay | Admitting: Family Medicine

## 2015-12-29 DIAGNOSIS — M109 Gout, unspecified: Secondary | ICD-10-CM

## 2015-12-29 DIAGNOSIS — M5010 Cervical disc disorder with radiculopathy, unspecified cervical region: Secondary | ICD-10-CM

## 2015-12-29 DIAGNOSIS — M75111 Incomplete rotator cuff tear or rupture of right shoulder, not specified as traumatic: Secondary | ICD-10-CM | POA: Diagnosis not present

## 2015-12-29 NOTE — Assessment & Plan Note (Signed)
Better control with a 300 mg of allopurinol

## 2015-12-29 NOTE — Progress Notes (Signed)
[name] [place], [place] [address] Phone: [phone] Subjective:    CC: right shoulder pain  Follow-up  [id number]  Monica [name] is a 80 y.o. female coming in with complaint of Right shoulder pain. Had to have osteophytic changes as well as gouty deposits. Patient had been noncompliant with formal physical therapy was sent again. Patient was also to continue conservative therapy. Patient was to increase her allopurinol dosage. Patient states physical therapy has been very beneficial. We discussed icing regimen and home exercises. We discussed which activities to do in which ones to avoid. Patient will continue to be active which she is very happy with. States that the pain seems to be more now around her neck and good down towards her shoulder but states that she has found stretches that have been helpful.   Past Medical History:  Diagnosis Date  . Allergic rhinitis   . Anxiety   . Benign neoplasm of breast 2013  . Cardiomegaly   . Compulsive overeating   . Depression   . Diffuse cystic mastopathy 2013  . Edema   . Family history of malignant neoplasm of breast   . Gout   . HTN (hypertension)   . Hyperlipidemia   . Lump or mass in breast 2012  . OA (osteoarthritis) of knee    with injections  . Obesity   . Other abnormal glucose   . Personal history of tobacco use, presenting hazards to health   . Sciatica   . Sinus infection 2010  . Unspecified sleep apnea    Past Surgical History:  Procedure Laterality Date  . adenosine cardiolite  1/08   low risk   . APPENDECTOMY    . BREAST LUMPECTOMY     right x1 ('89) left x2 ('70s, '90)  . CARDIAC CATHETERIZATION  2001   normal per pt  . COLONOSCOPY  11/02   diverticulosis  . COLONOSCOPY  9/06   diverticulosis, hemorhoids  . dexa  3/02   normal  . EYE SURGERY  2012   cataract  . knee replacement Right 9/11   R. Dr. Telford Nab  . sleep study  5/05  . TONSILLECTOMY    .  TOTAL ABDOMINAL HYSTERECTOMY     fibroids, age of 48  . TOTAL KNEE ARTHROPLASTY Left 2012  . US TRANSVAGINAL PELVIC MODIFIED  2000, 2001   Social History   Social History  . Marital status: Widowed    Spouse name: N/A  . Number of children: 1  . Years of education: N/A   Occupational History  . Retired school principal Retired   Social History Main Topics  . Smoking status: Former Smoker    Packs/day: 0.50    Years: 20.00    Types: Cigarettes  . Smokeless tobacco: Never Used     Comment: quit approx. 20 years ago  . Alcohol use 0.0 oz/week     Comment: wine occasional  . Drug use: No  . Sexual activity: No   Other Topics Concern  . None   Social History Narrative   Widowed, 1 daughter. 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 Buspirone Hcl     REACTION: made her sleepy, ? swollen legs  . Lisinopril     REACTION: dizziness  . Prozac [Fluoxetine Hcl]     sleepy   Family History  Problem Relation Age of  Onset  . Gout      whole family   . Stroke Father   . Hypertension Mother     (a lot of animosity in their relationship)  . Heart failure Mother   . Prostate cancer Brother   . Hepatitis Brother     Hep C; alcoholism (terminal)  . Breast cancer Sister   . Other Brother     drug addiction    Past medical history, social, surgical and family history all reviewed in electronic medical record.  No pertanent information unless stated regarding to the chief complaint.   Review of Systems: No headache, visual changes, nausea, vomiting, diarrhea, constipation, dizziness, abdominal pain, skin rash, fevers, chills, night sweats, weight loss, swollen lymph nodes,  chest pain, shortness of breath, mood changes.   Objective  Blood pressure 130/84, pulse 72, height 5\' 7"  (1.702 m), weight 256 lb (116.1 kg), SpO2 97 %.  Systems examined below as of 12/29/15 General: NAD A&O x3 mood, affect normal  HEENT:  Pupils equal, extraocular movements intact no nystagmus Respiratory: not short of breath at rest or with speaking Cardiovascular: No lower extremity edema, non tender Skin: Warm dry intact with no signs of infection or rash on extremities or on axial skeleton. Abdomen: Soft nontender, no masses Neuro: Cranial nerves  intact, neurovascularly intact in all extremities with 2+ DTRs and 2+ pulses. Lymph: No lymphadenopathy appreciated today  Gait normal with good balance and coordination.  MSK: Non tender with full range of motion and good stability and symmetric strength and tone of shoulders, elbows, wrist,  knee hips and ankles bilaterally.   Shoulder: Right Inspection reveals no abnormalities, atrophy or asymmetry. Palpation is normal with no tenderness over AC joint or bicipital groove. ROM is full in all planes passively. Mild crepitus noted Rotator cuff strength normal throughout. Mild impingement still remaining.  Speeds and Yergason's tests normal. No labral pathology noted with negative Obrien's, negative clunk and good stability. Normal scapular function observed. No painful arc and no drop arm sign. No apprehension sign Contralateral shoulder unremarkable Improvement noted.  Neck: Inspection unremarkable. No palpable stepoffs. Continued positive Spurling's with mild limitation of range of motion.. Grip strength and sensation normal in bilateral hands Strength good C4 to T1 distribution No sensory change to C4 to T1 Negative Hoffman sign bilaterally Reflexes normal       Impression and Recommendations:     This case required medical decision making of moderate complexity.      Note: This dictation was prepared with Dragon dictation along with smaller phrase technology. Any transcriptional errors that result from this process are unintentional.

## 2015-12-29 NOTE — Assessment & Plan Note (Signed)
I do believe that this is likely secondary to more of a cervical radiculopathy. Discussed with patient again at great length. No significant change in management except continuing formal physical therapy for another 4 weeks. We discussed continuing the gabapentin which I think is help with some of the radiculopathy. Patient states though that she was unable to tolerate it at high doses and discontinue. Patient will try 100 mg again at night and see how she does. Return again in 6 weeks

## 2015-12-29 NOTE — Assessment & Plan Note (Signed)
Patient does have significant weakness on exam. Near full range of motion. Likely will do well with conservative therapy. If worsening symptoms we'll consider injection.

## 2015-12-29 NOTE — Patient Instructions (Signed)
Happy holidays! I think we should continue with the physical therapy another 4 weeks.  Stay active 2 tennis balls in a tube sock and lay on them where head meets neck.  Try to find something to put on your shoulder to keep you from turning your head and looking at things in the wrong angle.  See me again in 6 weeks.

## 2016-01-17 ENCOUNTER — Ambulatory Visit (HOSPITAL_BASED_OUTPATIENT_CLINIC_OR_DEPARTMENT_OTHER): Payer: Medicare Other | Attending: Cardiology | Admitting: Cardiology

## 2016-01-17 VITALS — Ht 67.0 in | Wt 256.0 lb

## 2016-01-17 DIAGNOSIS — G4733 Obstructive sleep apnea (adult) (pediatric): Secondary | ICD-10-CM | POA: Insufficient documentation

## 2016-01-17 DIAGNOSIS — R0683 Snoring: Secondary | ICD-10-CM

## 2016-01-17 DIAGNOSIS — G4761 Periodic limb movement disorder: Secondary | ICD-10-CM | POA: Diagnosis not present

## 2016-02-07 NOTE — Procedures (Signed)
   Patient Name: Monica [name], Monica [name] Date: 01/17/2016 Gender: Female D.O.B: [birth year] Age (years): 6 Referring Provider: Fransico Him MD, ABSM Height (inches): 67 Interpreting Physician: Fransico Him MD, ABSM Weight (lbs): 262 RPSGT: Zadie Rhine BMI: 41 MRN: [id number] Neck Size: 16.50  CLINICAL INFORMATION The patient is referred for a CPAP titration to treat sleep apnea. Date of NPSG, Split Night or HST:11/05/2015  SLEEP STUDY TECHNIQUE As per the AASM Manual for the Scoring of Sleep and Associated Events v2.3 (April 2016) with a hypopnea requiring 4% desaturations.  The channels recorded and monitored were frontal, central and occipital EEG, electrooculogram (EOG), submentalis EMG (chin), nasal and oral airflow, thoracic and abdominal wall motion, anterior tibialis EMG, snore microphone, electrocardiogram, and pulse oximetry. Continuous positive airway pressure (CPAP) was initiated at the beginning of the study and titrated to treat sleep-disordered breathing.  MEDICATIONS Medications self-administered by patient taken the night of the study : N/A  TECHNICIAN COMMENTS Comments added by technician: PT [place]. Patient had difficulty initiating sleep. Patient was restless all through the night. Patient had more than two awakenings to use the bathroom  Comments added by scorer: N/A  RESPIRATORY PARAMETERS Optimal PAP Pressure (cm): 16  AHI at Optimal Pressure (/hr):0.0 Overall Minimal O2 (%):79.00  Supine % at Optimal Pressure (%):100 Minimal O2 at Optimal Pressure (%): 86.0    SLEEP ARCHITECTURE The study was initiated at 10:28:03 PM and ended at 5:24:25 AM.  Sleep onset time was 169.4 minutes and the sleep efficiency was 48.1%. The total sleep time was 200.5 minutes.  The patient spent 7.48% of the night in stage N1 sleep, 56.37% in stage N2 sleep, 0.00% in stage N3 and 36.15% in REM.Stage REM latency was 4.0 minutes  Wake after sleep onset  was 46.5. Alpha intrusion was absent. Supine sleep was 100.00%.  CARDIAC DATA The 2 lead EKG demonstrated sinus rhythm. The mean heart rate was 60.04 beats per minute. Other EKG findings include: PVCs, PACs, nonsustained atrial tachycardia.  LEG MOVEMENT DATA The total Periodic Limb Movements of Sleep (PLMS) were 358. The PLMS index was 107.15. A PLMS index of <15 is considered normal in adults.  IMPRESSIONS - The optimal PAP pressure was 16 cm of water. - Central sleep apnea was not noted during this titration (CAI = 0.0/h). - Severe oxygen desaturations were observed during this titration (min O2 = 79.00%). - No snoring was audible during this study. - 2-lead EKG demonstrated: PVCs, PACs, nonsustained atrial tachycardia - Severe periodic limb movements were observed during this study. Arousals associated with PLMs were rare.  DIAGNOSIS - Obstructive Sleep Apnea (327.23 [G47.33 ICD-10])  RECOMMENDATIONS - Trial of CPAP therapy on 16 cm H2O with a Medium size Resmed Full Face Mask AirFit F20 mask and heated humidification. - Avoid alcohol, sedatives and other CNS depressants that may worsen sleep apnea and disrupt normal sleep architecture. - Sleep hygiene should be reviewed to assess factors that may improve sleep quality. - Weight management and regular exercise should be initiated or continued. - Return to [facility] Center for re-evaluation after 10 weeks of therapy - Overnight pulse oximetry on CPAP  Barri Neidlinger Diplomate, American Board of Sleep Medicine  ELECTRONICALLY SIGNED ON:  02/07/2016, 5:59 PM [place] PH: [phone]   FX: (336) [place]

## 2016-02-09 NOTE — Progress Notes (Signed)
Spoke to the patient gave her the results she verbalized understanding

## 2016-02-10 ENCOUNTER — Ambulatory Visit (INDEPENDENT_AMBULATORY_CARE_PROVIDER_SITE_OTHER): Payer: Medicare Other | Admitting: Family Medicine

## 2016-02-10 ENCOUNTER — Encounter: Payer: Self-pay | Admitting: Family Medicine

## 2016-02-10 DIAGNOSIS — M5010 Cervical disc disorder with radiculopathy, unspecified cervical region: Secondary | ICD-10-CM | POA: Diagnosis not present

## 2016-02-10 NOTE — Assessment & Plan Note (Signed)
Degenerative changes of the neck noted. We discussed icing regimen and home exercises. We discussed objective is doing which ones to avoid. Patient will continue to be active otherwise. Patient follow-up with me again as needed.

## 2016-02-10 NOTE — Progress Notes (Signed)
[name] Monica [name], [place] [address] Phone: [phone] Subjective:    CC: right shoulder pain  Follow-up  [id number]  Monica [name] is a 80 y.o. female coming in with complaint of Right shoulder pain. Had to have osteophytic changes as well as gouty deposits. Patient's had been doing the gabapentin on a more regular basis and states that she has been sleeping much more comfortably. Patient also went to physical therapy and states that she has made significant improvement. Very minimal discomfort at any time. No radiation down the arm.   Past Medical History:  Diagnosis Date  . Allergic rhinitis   . Anxiety   . Benign neoplasm of breast 2013  . Cardiomegaly   . Compulsive overeating   . Depression   . Diffuse cystic mastopathy 2013  . Edema   . Family history of malignant neoplasm of breast   . Gout   . HTN (hypertension)   . Hyperlipidemia   . Lump or mass in breast 2012  . OA (osteoarthritis) of knee    with injections  . Obesity   . Other abnormal glucose   . Personal history of tobacco use, presenting hazards to health   . Sciatica   . Sinus infection 2010  . Unspecified sleep apnea    Past Surgical History:  Procedure Laterality Date  . adenosine cardiolite  1/08   low risk   . APPENDECTOMY    . BREAST LUMPECTOMY     right x1 ('89) left x2 ('70s, '90)  . CARDIAC CATHETERIZATION  2001   normal per pt  . COLONOSCOPY  11/02   diverticulosis  . COLONOSCOPY  9/06   diverticulosis, hemorhoids  . dexa  3/02   normal  . EYE SURGERY  2012   cataract  . knee replacement Right 9/11   R. Dr. Telford Nab  . sleep study  5/05  . TONSILLECTOMY    . TOTAL ABDOMINAL HYSTERECTOMY     fibroids, age of 63  . TOTAL KNEE ARTHROPLASTY Left 2012  . US TRANSVAGINAL PELVIC MODIFIED  2000, 2001   Social History   Social History  . Marital status: Widowed    Spouse name: N/A  . Number of children: 1  . Years of education:  N/A   Occupational History  . Retired school principal Retired   Social History Main Topics  . Smoking status: Former Smoker    Packs/day: 0.50    Years: 20.00    Types: Cigarettes  . Smokeless tobacco: Never Used     Comment: quit approx. 20 years ago  . Alcohol use 0.0 oz/week     Comment: wine occasional  . Drug use: No  . Sexual activity: No   Other Topics Concern  . None   Social History Narrative   Widowed, 1 daughter. 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 Buspirone Hcl     REACTION: made her sleepy, ? swollen legs  . Lisinopril     REACTION: dizziness  . Prozac [Fluoxetine Hcl]     sleepy   Family History  Problem Relation Age of Onset  . Gout      whole family   . Stroke Father   . Hypertension Mother     (a lot of animosity in their relationship)  . Heart failure Mother   . Prostate cancer Brother   .  Hepatitis Brother     Hep C; alcoholism (terminal)  . Breast cancer Sister   . Other Brother     drug addiction    Past medical history, social, surgical and family history all reviewed in electronic medical record.  No pertanent information unless stated regarding to the chief complaint.   Review of Systems: No headache, visual changes, nausea, vomiting, diarrhea, constipation, dizziness, abdominal pain, skin rash, fevers, chills, night sweats, weight loss, swollen lymph nodes, body aches, joint swelling, muscle aches, chest pain, shortness of breath, mood changes.     Objective  Blood pressure 124/84, pulse 84, height 5\' 7"  (1.702 m), weight 257 lb (116.6 kg), SpO2 97 %.  Systems examined below as of 02/10/16 General: NAD A&O x3 mood, affect normal  HEENT: Pupils equal, extraocular movements intact no nystagmus Respiratory: not short of breath at rest or with speaking Cardiovascular: No lower extremity edema, non tender Skin: Warm dry intact with no signs of infection or rash on  extremities or on axial skeleton. Abdomen: Soft nontender, no masses Neuro: Cranial nerves  intact, neurovascularly intact in all extremities with 2+ DTRs and 2+ pulses. Lymph: No lymphadenopathy appreciated today  Gait normal with good balance and coordination.  MSK: Non tender with full range of motion and good stability and symmetric strength and tone of shoulders, elbows, wrist,  knee hips and ankles bilaterally.   Neck: Inspection unremarkable. No palpable stepoffs. Negative Spurling's maneuver. Full neck range of motion Grip strength and sensation normal in bilateral hands Strength good C4 to T1 distribution No sensory change to C4 to T1 Negative Hoffman sign bilaterally Reflexes normal    Impression and Recommendations:     This case required medical decision making of moderate complexity.      Note: This dictation was prepared with Dragon dictation along with smaller phrase technology. Any transcriptional errors that result from this process are unintentional.

## 2016-02-10 NOTE — Patient Instructions (Signed)
Good to see you  Monica [name] is your friend  Stay active Keep doing the exercises up to 2 times a week  The gabapentin use as much as you want at night See me again when you need me Happy holidays!  Happy New Year!

## 2016-02-12 ENCOUNTER — Other Ambulatory Visit: Payer: Self-pay | Admitting: *Deleted

## 2016-02-12 DIAGNOSIS — R0902 Hypoxemia: Secondary | ICD-10-CM

## 2016-02-24 ENCOUNTER — Other Ambulatory Visit: Payer: Self-pay | Admitting: Family Medicine

## 2016-02-25 ENCOUNTER — Encounter: Payer: Self-pay | Admitting: General Surgery

## 2016-02-25 ENCOUNTER — Other Ambulatory Visit: Payer: Self-pay | Admitting: Family Medicine

## 2016-02-26 ENCOUNTER — Other Ambulatory Visit: Payer: Self-pay | Admitting: Family Medicine

## 2016-02-26 MED ORDER — POTASSIUM CHLORIDE CRYS ER 20 MEQ PO TBCR: 20.0000 meq | EXTENDED_RELEASE_TABLET | Freq: Every day | ORAL | 11 refills | Status: DC

## 2016-02-26 NOTE — Telephone Encounter (Signed)
Refilled by Dr.Towers on 02/19/15. Pt last seen 09/10/15 with follow up appt. Last potassium was 01/2015 at a value of 3.6. Please advise?

## 2016-03-01 ENCOUNTER — Ambulatory Visit (INDEPENDENT_AMBULATORY_CARE_PROVIDER_SITE_OTHER): Payer: Medicare Other | Admitting: General Surgery

## 2016-03-01 VITALS — BP 116/64 | HR 68 | Resp 14 | Ht 67.0 in | Wt 256.0 lb

## 2016-03-01 DIAGNOSIS — N6019 Diffuse cystic mastopathy of unspecified breast: Secondary | ICD-10-CM

## 2016-03-01 DIAGNOSIS — Z803 Family history of malignant neoplasm of breast: Secondary | ICD-10-CM | POA: Diagnosis not present

## 2016-03-01 DIAGNOSIS — L570 Actinic keratosis: Secondary | ICD-10-CM | POA: Diagnosis not present

## 2016-03-01 NOTE — Progress Notes (Signed)
Patient ID: Monica [name], female   DOB: 16-Feb-1936, 81 y.o.   MRN: [id number]  Chief Complaint  Patient presents with  . Follow-up    HPI Monica [name] is a 81 y.o. female. who presents for a breast evaluation. The most recent mammogram was done on 02-23-16.  Patient does perform regular self breast checks and gets regular mammograms done.  No new breast issues. She does admit to occasional irritation under right breast from skin tags. I have reviewed the history of present illness with the patient.   HPI  Past Medical History:  Diagnosis Date  . Allergic rhinitis   . Anxiety   . Benign neoplasm of breast 2013  . Cardiomegaly   . Compulsive overeating   . Depression   . Diffuse cystic mastopathy 2013  . Edema   . Family history of malignant neoplasm of breast   . Gout   . HTN (hypertension)   . Hyperlipidemia   . Lump or mass in breast 2012  . OA (osteoarthritis) of knee    with injections  . Obesity   . Other abnormal glucose   . Personal history of tobacco use, presenting hazards to health   . Sciatica   . Sinus infection 2010  . Unspecified sleep apnea     Past Surgical History:  Procedure Laterality Date  . adenosine cardiolite  1/08   low risk   . APPENDECTOMY    . BREAST LUMPECTOMY     right x1 ('89) left x2 ('70s, '90)  . CARDIAC CATHETERIZATION  2001   normal per pt  . COLONOSCOPY  11/02   diverticulosis  . COLONOSCOPY  9/06   diverticulosis, hemorhoids  . dexa  3/02   normal  . EYE SURGERY  2012   cataract  . knee replacement Right 9/11   R. Dr. Telford Nab  . sleep study  5/05  . TONSILLECTOMY    . TOTAL ABDOMINAL HYSTERECTOMY     fibroids, age of 55  . TOTAL KNEE ARTHROPLASTY Left 2012  . US TRANSVAGINAL PELVIC MODIFIED  2000, 2001    Family History  Problem Relation Age of Onset  . Gout      whole family   . Stroke Father   . Hypertension Mother     (a lot of animosity in their relationship)  . Heart failure Mother   . Prostate  cancer Brother   . Hepatitis Brother     Hep C; alcoholism (terminal)  . Breast cancer Sister   . Other Brother     drug addiction    Social History Social History  Substance Use Topics  . Smoking status: Former Smoker    Packs/day: 0.50    Years: 20.00    Types: Cigarettes  . Smokeless tobacco: Never Used     Comment: quit approx. 20 years ago  . Alcohol use 0.0 oz/week     Comment: wine occasional    Allergies  Allergen Reactions  . Buspirone Hcl     REACTION: made her sleepy, ? swollen legs  . Lisinopril     REACTION: dizziness  . Prozac [Fluoxetine Hcl]     sleepy    Current Outpatient Prescriptions  Medication Sig Dispense Refill  . ACCU-CHEK AVIVA PLUS test strip CHECK GLUCOSE WITH EACH MEAL AND AT BEDTIME AS DIRECTED 100 each 3  . ACCU-CHEK SOFTCLIX LANCETS lancets Check blood sugar once daily and as needed. Dx R73.01 100 each 3  . allopurinol (ZYLOPRIM) 300 MG tablet Take 1  tablet (300 mg total) by mouth daily. 90 tablet 1  . amLODipine (NORVASC) 5 MG tablet Take 1 tablet (5 mg total) by mouth daily. 30 tablet 11  . calcium carbonate (OS-CAL) 600 MG TABS Take 600 mg by mouth daily.     . furosemide (LASIX) 40 MG tablet Take 1 tablet (40 mg total) by mouth daily. 30 tablet 5  . gabapentin (NEURONTIN) 100 MG capsule Take 2 capsules (200 mg total) by mouth at bedtime. 60 capsule 3  . losartan (COZAAR) 50 MG tablet Take 1 tablet (50 mg total) by mouth daily. 30 tablet 4  . MAGNESIUM CARBONATE PO Take 250 mg by mouth daily.    . metoprolol succinate (TOPROL-XL) 100 MG 24 hr tablet Take 1 tablet (100 mg total) by mouth daily. Take with or immediately following a meal. 30 tablet 4  . Omega-3 Fatty Acids (FISH OIL PO) Take 500 capsules by mouth daily.     . potassium chloride SA (K-DUR,KLOR-CON) 20 MEQ tablet Take 1 tablet (20 mEq total) by mouth daily. 30 tablet 11   No current facility-administered medications for this visit.     Review of Systems Review of Systems   Constitutional: Negative.   Respiratory: Negative.   Cardiovascular: Negative.     Blood pressure 116/64, pulse 68, resp. rate 14, height 5\' 7"  (1.702 m), weight 256 lb (116.1 kg).  Physical Exam Physical Exam  Constitutional: She is oriented to person, place, and time. She appears well-developed and well-nourished.  HENT:  Mouth/Throat: Oropharynx is clear and moist.  Eyes: Conjunctivae are normal. No scleral icterus.  Neck: Neck supple.  Cardiovascular: Normal rate, regular rhythm and normal heart sounds.   Pulmonary/Chest: Effort normal and breath sounds normal. Right breast exhibits no inverted nipple, no mass, no nipple discharge, no skin change and no tenderness. Left breast exhibits no inverted nipple, no mass, no nipple discharge, no skin change and no tenderness.  Bilaterally at inframammary fold has multiple keratotic lesions.  Abdominal: Soft. Normal appearance. There is no tenderness.  Lymphadenopathy:    She has no cervical adenopathy.    She has no axillary adenopathy.  Neurological: She is alert and oriented to person, place, and time.  Skin: Skin is warm and dry.  Psychiatric: Her behavior is normal.    Data Reviewed  Mammogram reviewed  Assessment    Stable physical exam. History of fibrocystic disease. Family history of breast cancer.   Plan    Recommend dermatology evaluation for evaluation and treatment of skin keratosis in her breast. The patient has been asked to return to the office in one year with a bilateral screening mammogram. Patient is OK to follow with Dr Bary Castilla. Continue self breast exams. Call office for any new breast issues or concerns     This information has been scribed by Karie Fetch RN, BSN,BC.  SANKAR,SEEPLAPUTHUR G 03/02/2016, 10:45 AM

## 2016-03-01 NOTE — Patient Instructions (Addendum)
The patient is aware to call back for any questions or concerns. Recommend dermatology evaluation of possible skin keratosis.  The patient has been asked to return to the office in one year with a bilateral screening mammogram with Dr Bary Castilla

## 2016-03-21 ENCOUNTER — Telehealth: Payer: Self-pay | Admitting: *Deleted

## 2016-03-21 NOTE — Telephone Encounter (Signed)
-----   Message from Freada Bergeron, [place] sent at 02/09/2016  5:21 PM EST ----- Regarding: 10 wek appt. and ONO set up Please setup appointment in 10 weeks after cpap set up and check to see if ONO has been scheduled

## 2016-03-21 NOTE — Telephone Encounter (Signed)
Dr Radford Pax I called the patient today to follow up with her and the patient states she never got set up with her CPAP or her ONO  because she says she wants a face to face with the doctor. She said she doesn't know a Dr Radford Pax but she got a bill with the name Dr Radford Pax on it from her insurance [employer] and she wonders how can that happen when she has never seen you. She is very confused about everything and she doesn't understand how things are being handled, she became very upset and nervous and then she hung up the phone.

## 2016-03-30 ENCOUNTER — Other Ambulatory Visit: Payer: Self-pay | Admitting: *Deleted

## 2016-03-30 MED ORDER — GABAPENTIN 100 MG PO CAPS: 200.0000 mg | ORAL_CAPSULE | Freq: Every day | ORAL | 1 refills | Status: DC

## 2016-03-30 NOTE — Telephone Encounter (Signed)
Refill done.  

## 2016-04-14 ENCOUNTER — Ambulatory Visit: Payer: Medicare Other | Admitting: Family Medicine

## 2016-04-15 ENCOUNTER — Ambulatory Visit (INDEPENDENT_AMBULATORY_CARE_PROVIDER_SITE_OTHER): Payer: Medicare Other

## 2016-04-15 ENCOUNTER — Ambulatory Visit (INDEPENDENT_AMBULATORY_CARE_PROVIDER_SITE_OTHER): Payer: Medicare Other | Admitting: Family Medicine

## 2016-04-15 ENCOUNTER — Encounter: Payer: Self-pay | Admitting: Family Medicine

## 2016-04-15 VITALS — BP 122/77 | HR 75 | Temp 98.3°F | Wt 258.6 lb

## 2016-04-15 DIAGNOSIS — E785 Hyperlipidemia, unspecified: Secondary | ICD-10-CM | POA: Diagnosis not present

## 2016-04-15 DIAGNOSIS — L821 Other seborrheic keratosis: Secondary | ICD-10-CM | POA: Diagnosis not present

## 2016-04-15 DIAGNOSIS — R05 Cough: Secondary | ICD-10-CM

## 2016-04-15 DIAGNOSIS — I10 Essential (primary) hypertension: Secondary | ICD-10-CM

## 2016-04-15 DIAGNOSIS — I5032 Chronic diastolic (congestive) heart failure: Secondary | ICD-10-CM | POA: Diagnosis not present

## 2016-04-15 DIAGNOSIS — R053 Chronic cough: Secondary | ICD-10-CM

## 2016-04-15 DIAGNOSIS — G4733 Obstructive sleep apnea (adult) (pediatric): Secondary | ICD-10-CM

## 2016-04-15 DIAGNOSIS — R7303 Prediabetes: Secondary | ICD-10-CM | POA: Diagnosis not present

## 2016-04-15 DIAGNOSIS — Z130 Encounter for screening for diseases of the blood and blood-forming organs and certain disorders involving the immune mechanism: Secondary | ICD-10-CM

## 2016-04-15 HISTORY — DX: Other seborrheic keratosis: L82.1

## 2016-04-15 HISTORY — DX: Chronic cough: R05.3

## 2016-04-15 LAB — COMPREHENSIVE METABOLIC PANEL
ALT: 21 U/L (ref 0–35)
AST: 22 U/L (ref 0–37)
Albumin: 3.9 g/dL (ref 3.5–5.2)
Alkaline Phosphatase: 83 U/L (ref 39–117)
BUN: 16 mg/dL (ref 6–23)
CO2: 29 mEq/L (ref 19–32)
Calcium: 9.3 mg/dL (ref 8.4–10.5)
Chloride: 105 mEq/L (ref 96–112)
Creatinine, Ser: 0.83 mg/dL (ref 0.40–1.20)
GFR: 85.36 mL/min (ref >60.00)
Glucose, Bld: 105 mg/dL — ABNORMAL HIGH (ref 70–99)
Potassium: 3.8 mEq/L (ref 3.5–5.1)
Sodium: 141 mEq/L (ref 135–145)
Total Bilirubin: 0.7 mg/dL (ref 0.2–1.2)
Total Protein: 7.1 g/dL (ref 6.0–8.3)

## 2016-04-15 LAB — LIPID PANEL
Cholesterol: 192 mg/dL (ref 0–200)
HDL: 44.9 mg/dL (ref >39.00)
LDL Cholesterol: 129 mg/dL — ABNORMAL HIGH (ref 0–99)
NonHDL: 146.67
Total CHOL/HDL Ratio: 4
Triglycerides: 90 mg/dL (ref 0.0–149.0)
VLDL: 18 mg/dL (ref 0.0–40.0)

## 2016-04-15 LAB — CBC
HCT: 40.4 % (ref 36.0–46.0)
Hemoglobin: 13.1 g/dL (ref 12.0–15.0)
MCHC: 32.5 g/dL (ref 30.0–36.0)
MCV: 86.6 fl (ref 78.0–100.0)
Platelets: 258 10*3/uL (ref 150.0–400.0)
RBC: 4.67 Mil/uL (ref 3.87–5.11)
RDW: 16 % — ABNORMAL HIGH (ref 11.5–15.5)
WBC: 6.9 10*3/uL (ref 4.0–10.5)

## 2016-04-15 LAB — HEMOGLOBIN A1C: Hgb A1c MFr Bld: 6.2 % (ref 4.6–6.5)

## 2016-04-15 MED ORDER — TETANUS-DIPHTH-ACELL PERTUSSIS 5-2.5-18.5 LF-MCG/0.5 IM SUSP: 0.5000 mL | Freq: Once | INTRAMUSCULAR | 0 refills | Status: AC

## 2016-04-15 MED ORDER — DOXYCYCLINE HYCLATE 100 MG PO TABS: 100.0000 mg | ORAL_TABLET | Freq: Two times a day (BID) | ORAL | 0 refills | Status: DC

## 2016-04-15 MED ORDER — HYDROCOD POLST-CPM POLST ER 10-8 MG/5ML PO SUER: 5.0000 mL | Freq: Two times a day (BID) | ORAL | 0 refills | Status: DC | PRN

## 2016-04-15 NOTE — Assessment & Plan Note (Signed)
Stable. Patient's lower extremity edema is secondary to morbid obesity and venous stasis. Continue current medications.

## 2016-04-15 NOTE — Assessment & Plan Note (Signed)
New problem. Sending to dermatology for cryotherapy.

## 2016-04-15 NOTE — Patient Instructions (Signed)
Given your continued cough, I am treating you empirically with antibiotic.  Continue your other medications.  We will call with the lab results.  We will arrange for you to see dermatology.  Follow up in 6 months.  Take care  Dr. Lacinda Axon

## 2016-04-15 NOTE — Addendum Note (Signed)
Addended by: [name] on: 04/15/2016 11:54 AM   Modules accepted: Orders

## 2016-04-15 NOTE — Assessment & Plan Note (Signed)
New problem. Obtaining chest x-ray. Treating empirically with doxycycline and Tussionex given severity and persistence.

## 2016-04-15 NOTE — Assessment & Plan Note (Signed)
At goal. Continue losartan, metoprolol, Norvasc.

## 2016-04-15 NOTE — Assessment & Plan Note (Signed)
Lipid panel today. Patient is not currently on any treatment

## 2016-04-15 NOTE — Progress Notes (Signed)
Subjective:  Patient ID: Monica [name], female    DOB: 06-Jul-1935  Age: 81 y.o. MRN: [id number]  CC: Follow up; cough; has additional concerns as well  HPI:  82 year old female with chronic diastolic heart failure, OSA, morbid obesity, hypertension, hyperlipidemia presents for follow-up.  Hypertension  At goal on losartan, metoprolol, and Norvasc.  Hyperlipidemia  Unsure of control.  She needs labs today.  She is not on any medication at this time.  Cough  Patient has had a severe and productive cough for the past 2 months. She had some improvement but this worsened again 2 weeks ago.  Cough is productive of thick and discolored sputum.  No associated fevers or chills.  No shortness of breath.  She's been using over-the-counter NyQuil and Robitussin without improvement.  Areas under breasts  Patient reports a long-standing history of "moles" under her breasts.  She states that they are irritated, sensitive, and painful.  She would like examined today.  OSA  Noncompliant.  Has had a sleep study and has not had an additional titration study.  She is not using CPAP.  Edema  Patient reports significant edema.  She is quite concerned about this.  Social Hx   Social History   Social History  . Marital status: Widowed    Spouse name: N/A  . Number of children: 1  . Years of education: N/A   Occupational History  . Retired school principal Retired   Social History Main Topics  . Smoking status: Former Smoker    Packs/day: 0.50    Years: 20.00    Types: Cigarettes  . Smokeless tobacco: Never Used     Comment: quit approx. 20 years ago  . Alcohol use 0.0 oz/week     Comment: wine occasional  . Drug use: No  . Sexual activity: No   Other Topics Concern  . None   Social History Narrative   Widowed, 1 daughter. 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     Cardiovascular: Positive for leg swelling.  Skin:       "moles" under breasts.   Objective:  BP 122/77   Pulse 75   Temp 98.3 F (36.8 C) (Oral)   Wt 258 lb 9.6 oz (117.3 kg)   SpO2 96%   BMI 40.50 kg/m   BP/Weight 04/15/2016 03/01/2016 [id number]  Systolic BP [id number]  Diastolic BP 77 64 84  Wt. (Lbs) 258.6 256 257  BMI 40.5 40.1 40.25   Physical Exam  Constitutional: She is oriented to person, place, and time. She appears well-developed. No distress.  Cardiovascular: Normal rate and regular rhythm.   2+ pedal edema.  Pulmonary/Chest: Effort normal and breath sounds normal.  Neurological: She is alert and oriented to person, place, and time.  Skin:  Numerous seborrheic keratoses noted underneath both breasts.  Psychiatric:  She is very tangential.   Vitals reviewed.  Lab Results  Component Value Date   WBC 6.7 02/06/2015   HGB 13.3 02/06/2015   HCT 41.5 02/06/2015   PLT 236.0 02/06/2015   GLUCOSE 109 (H) 02/06/2015   CHOL 187 02/06/2015   TRIG 96.0 02/06/2015   HDL 41.40 02/06/2015   LDLDIRECT 131.8 12/09/2009   LDLCALC 126 (H) 02/06/2015   ALT 39 (H) 02/06/2015   AST 31 02/06/2015   NA 143 02/06/2015   K 3.6 02/06/2015   CL  105 02/06/2015   CREATININE 0.75 02/06/2015   BUN 16 02/06/2015   CO2 30 02/06/2015   TSH 1.71 02/06/2015   INR 1.02 04/23/2010   HGBA1C 6.1 12/31/2014    Assessment & Plan:   Problem List Items Addressed This Visit      Chronic   Chronic diastolic heart failure (HCC)    Stable. Patient's lower extremity edema is secondary to morbid obesity and venous stasis. Continue current medications.        Other   Seborrheic keratoses    New problem. Sending to dermatology for cryotherapy.      Relevant Orders   Ambulatory referral to Dermatology   Obstructive sleep apnea    Advised to follow-up and get herself CPAP machine and to be compliant.      Morbid obesity ([place])   Relevant Orders   Hemoglobin A1c   Hyperlipidemia     Lipid panel today. Patient is not currently on any treatment      Relevant Orders   Lipid panel   Essential hypertension - Primary    At goal. Continue losartan, metoprolol, Norvasc.      Relevant Orders   Comprehensive metabolic panel   Chronic cough    New problem. Obtaining chest x-ray. Treating empirically with doxycycline and Tussionex given severity and persistence.      Relevant Orders   DG Chest 2 View    Other Visit Diagnoses    Prediabetes       Relevant Orders   Hemoglobin A1c   Screening for deficiency anemia       Relevant Orders   CBC      Meds ordered this encounter  Medications  . chlorpheniramine-HYDROcodone (TUSSIONEX PENNKINETIC ER) 10-8 MG/5ML SUER    Sig: Take 5 mLs by mouth every 12 (twelve) hours as needed.    Dispense:  115 mL    Refill:  0  . doxycycline (VIBRA-TABS) 100 MG tablet    Sig: Take 1 tablet (100 mg total) by mouth 2 (two) times daily.    Dispense:  14 tablet    Refill:  0    Follow-up: 6 months  [place] DO [facility]

## 2016-04-15 NOTE — Assessment & Plan Note (Signed)
Advised to follow-up and get herself CPAP machine and to be compliant.

## 2016-04-15 NOTE — Progress Notes (Signed)
Pre visit review using our clinic review tool, if applicable. No additional management support is needed unless otherwise documented below in the visit note. 

## 2016-05-04 DIAGNOSIS — Z96653 Presence of artificial knee joint, bilateral: Secondary | ICD-10-CM | POA: Insufficient documentation

## 2016-05-04 HISTORY — DX: Presence of artificial knee joint, bilateral: Z96.653

## 2016-06-04 ENCOUNTER — Other Ambulatory Visit: Payer: Self-pay | Admitting: Family Medicine

## 2016-08-04 ENCOUNTER — Other Ambulatory Visit: Payer: Self-pay | Admitting: Interventional Cardiology

## 2016-08-08 ENCOUNTER — Other Ambulatory Visit: Payer: Self-pay | Admitting: Interventional Cardiology

## 2016-08-08 ENCOUNTER — Other Ambulatory Visit: Payer: Self-pay | Admitting: Family Medicine

## 2016-08-21 NOTE — Progress Notes (Signed)
Cardiology Office Note    Date:  08/22/2016   ID:  Monica [name], DOB April 08, 1935, MRN [id number]  PCP:  [name], DO  Cardiologist: Sinclair Grooms, MD   Chief Complaint  Patient presents with  . Hypertension    History of Present Illness:  Monica [name] is a 81 y.o. female  of severe hypertension, left ventricular hypertrophy with diastolic left ventricular heart failure, obesity, medication and therapy noncompliance, obesity, known obstructive sleep apnea but not compliant with therapy, and cardiomegaly.  Major complaint is significant lower extremity weakness, pain, and fatigue when she tries to walk. She cannot walk 150 feet without her legs giving out and feeling somewhat numb and uncomfortable. She denies chest pain. No orthopnea PND.   Though sleep apnea is present, she has decided against wearing a C Pap mask because of claustrophobia.  Greater than 50% of the days visit was spent in counseling related to her exertional intolerance, leg pain occurred be related to PAD, and poor quality of life related to inability to ambulate.  Past Medical History:  Diagnosis Date  . Allergic rhinitis   . Anxiety   . Benign neoplasm of breast 2013  . Cardiomegaly   . Compulsive overeating   . Depression   . Diffuse cystic mastopathy 2013  . Edema   . Family history of malignant neoplasm of breast   . Gout   . HTN (hypertension)   . Hyperlipidemia   . Lump or mass in breast 2012  . OA (osteoarthritis) of knee    with injections  . Obesity   . Other abnormal glucose   . Personal history of tobacco use, presenting hazards to health   . Sciatica   . Sinus infection 2010  . Unspecified sleep apnea     Past Surgical History:  Procedure Laterality Date  . adenosine cardiolite  1/08   low risk   . APPENDECTOMY    . BREAST LUMPECTOMY     right x1 ('89) left x2 ('70s, '90)  . CARDIAC CATHETERIZATION  2001   normal per pt  . COLONOSCOPY  11/02   diverticulosis  .  COLONOSCOPY  9/06   diverticulosis, hemorhoids  . dexa  3/02   normal  . EYE SURGERY  2012   cataract  . knee replacement Right 9/11   R. Dr. Telford Nab  . sleep study  5/05  . TONSILLECTOMY    . TOTAL ABDOMINAL HYSTERECTOMY     fibroids, age of 19  . TOTAL KNEE ARTHROPLASTY Left 2012  . US TRANSVAGINAL PELVIC MODIFIED  2000, 2001    Current Medications: Outpatient Medications Prior to Visit  Medication Sig Dispense Refill  . ACCU-CHEK AVIVA PLUS test strip CHECK GLUCOSE WITH EACH MEAL AND AT BEDTIME AS DIRECTED 100 each 3  . ACCU-CHEK SOFTCLIX LANCETS lancets Check blood sugar once daily and as needed. Dx R73.01 100 each 3  . amLODipine (NORVASC) 5 MG tablet TAKE 1 TABLET BY MOUTH DAILY 30 tablet 0  . calcium carbonate (OS-CAL) 600 MG TABS Take 600 mg by mouth daily.     . furosemide (LASIX) 40 MG tablet TAKE 1 TABLET BY MOUTH DAILY 30 tablet 1  . losartan (COZAAR) 50 MG tablet TAKE 1 TABLET BY MOUTH DAILY 30 tablet 5  . MAGNESIUM CARBONATE PO Take 250 mg by mouth daily.    . metoprolol succinate (TOPROL-XL) 100 MG 24 hr tablet TAKE 1 TABLET BY MOUTH DAILY WITH OR IMMEDIATLY FOLLOWING A MEAL 30  tablet 5  . Omega-3 Fatty Acids (FISH OIL PO) Take 500 capsules by mouth daily.     . potassium chloride SA (K-DUR,KLOR-CON) 20 MEQ tablet Take 1 tablet (20 mEq total) by mouth daily. 30 tablet 11  . gabapentin (NEURONTIN) 100 MG capsule Take 2 capsules (200 mg total) by mouth at bedtime. (Patient not taking: Reported on 08/22/2016) 180 capsule 1  . allopurinol (ZYLOPRIM) 300 MG tablet Take 1 tablet (300 mg total) by mouth daily. (Patient not taking: Reported on 08/22/2016) 90 tablet 1  . amLODipine (NORVASC) 5 MG tablet Take 1 tablet (5 mg total) by mouth daily. PT NEEDS APPT, CALL [phone] TO SCHEDULE, 1ST ATTEMPT (Patient not taking: Reported on 08/22/2016) 30 tablet 0  . chlorpheniramine-HYDROcodone (TUSSIONEX PENNKINETIC ER) 10-8 MG/5ML SUER Take 5 mLs by mouth every 12 (twelve) hours as  needed. (Patient not taking: Reported on 08/22/2016) 115 mL 0  . doxycycline (VIBRA-TABS) 100 MG tablet Take 1 tablet (100 mg total) by mouth 2 (two) times daily. (Patient not taking: Reported on 08/22/2016) 14 tablet 0   No facility-administered medications prior to visit.      Allergies:   Buspirone hcl; Lisinopril; and Prozac [fluoxetine hcl]   Social History   Social History  . Marital status: Widowed    Spouse name: N/A  . Number of children: 1  . Years of education: N/A   Occupational History  . Retired school principal Retired   Social History Main Topics  . Smoking status: Former Smoker    Packs/day: 0.50    Years: 20.00    Types: Cigarettes  . Smokeless tobacco: Never Used     Comment: quit approx. 20 years ago  . Alcohol use 0.0 oz/week     Comment: wine occasional  . Drug use: No  . Sexual activity: No   Other Topics Concern  . None   Social History Narrative   Widowed, 1 daughter. 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   ROS:   Please see the history of present illness.    Left knee pain. Left knee is status post replacement. Depression and anxiety. Legs prevent her from being able to put this patient in social activities because she has a difficult time walking from her car to the activity.  All other systems reviewed and are negative.   PHYSICAL EXAM:   VS:  BP 134/82 (BP Location: Right Arm)   Pulse 74   Ht 5\' 7"  (1.702 m)   Wt 252 lb 12.8 oz (114.7 kg)   BMI 39.59 kg/m    GEN: Well nourished, well developed, in no acute distress  HEENT: normal  Neck: no JVD, carotid bruits, or masses Cardiac: RRR; no murmurs, rubs, or gallops,no edema  Respiratory:  clear to auscultation  bilaterally, normal work of breathing GI: soft, nontender, nondistended, + BS MS: no deformity or atrophy  Skin: warm and dry, no rash Neuro:  Alert and Oriented x 3, Strength and sensation are intact Psych: euthymic mood, full affect  Wt Readings from Last 3 Encounters:  08/22/16 252 lb 12.8 oz (114.7 kg)  04/15/16 258 lb 9.6 oz (117.3 kg)  03/01/16 256 lb (116.1 kg)  Studies/Labs Reviewed:   EKG:  EKG  Normal sinus rhythm, 74 bpm, nonspecific T wave flattening.  Recent Labs: 04/15/2016: ALT 21; BUN 16; Creatinine, Ser 0.83; Hemoglobin 13.1; Platelets 258.0; Potassium 3.8; Sodium 141   Lipid Panel    Component Value Date/Time   CHOL 192 04/15/2016 1111   TRIG 90.0 04/15/2016 1111   HDL 44.90 04/15/2016 1111   CHOLHDL 4 04/15/2016 1111   VLDL 18.0 04/15/2016 1111   LDLCALC 129 (H) 04/15/2016 1111   LDLDIRECT 131.8 12/09/2009 1643    Additional studies/ records that were reviewed today include:  No new data    ASSESSMENT:    1. Essential hypertension   2. Chronic diastolic heart failure ([place])   3. Obstructive sleep apnea   4. Claudication ([place])   5. Other hyperlipidemia      PLAN:  In order of problems listed above:  1. Blood pressure is at target less than 140/90 mmHg 2. No evidence of volume overload on exam 3. Decided against therapy. 4. We'll do arterial Doppler study and duplex to rule out obstructive PAD. 5. LDL target less than 100 and if evidence of vascular disease may need to get lipids less than 70.  I have encouraged exercise and weight loss. She currently cannot do any type of aerobic activity because of left knee pain and bilateral leg fatigue and weakness with walking possibly related to claudication. Lower extremity arterial Doppler study. Clinical follow-up 1 year. A parking sticker for handicapped access is authorized.  Medication Adjustments/Labs and Tests Ordered: Current medicines are reviewed at length with the patient today.   Concerns regarding medicines are outlined above.  Medication changes, Labs and Tests ordered today are listed in the Patient Instructions below. Patient Instructions  Medication Instructions:  None  Labwork: None  Testing/Procedures: Your physician has requested that you have a lower or upper extremity arterial duplex. This test is an ultrasound of the arteries in the legs or arms. It looks at arterial blood flow in the legs and arms. Allow one hour for Lower and Upper Arterial scans. There are no restrictions or special instructions  Follow-Up: Your physician wants you to follow-up in: 1 year with Dr. Tamala Julian.  You will receive a reminder letter in the mail two months in advance. If you don't receive a letter, please call our office to schedule the follow-up appointment.   Any Other Special Instructions Will Be Listed Below (If Applicable).     If you need a refill on your cardiac medications before your next appointment, please call your pharmacy.      Signed, Sinclair Grooms, MD  08/22/2016 12:37 PM    [facility] [place], [place],   [address] Phone: [phone]; Fax: [phone]

## 2016-08-22 ENCOUNTER — Ambulatory Visit (INDEPENDENT_AMBULATORY_CARE_PROVIDER_SITE_OTHER): Payer: Medicare Other | Admitting: Interventional Cardiology

## 2016-08-22 ENCOUNTER — Encounter: Payer: Self-pay | Admitting: Interventional Cardiology

## 2016-08-22 VITALS — BP 134/82 | HR 74 | Ht 67.0 in | Wt 252.8 lb

## 2016-08-22 DIAGNOSIS — I10 Essential (primary) hypertension: Secondary | ICD-10-CM | POA: Diagnosis not present

## 2016-08-22 DIAGNOSIS — G4733 Obstructive sleep apnea (adult) (pediatric): Secondary | ICD-10-CM

## 2016-08-22 DIAGNOSIS — E784 Other hyperlipidemia: Secondary | ICD-10-CM | POA: Diagnosis not present

## 2016-08-22 DIAGNOSIS — I739 Peripheral vascular disease, unspecified: Secondary | ICD-10-CM | POA: Diagnosis not present

## 2016-08-22 DIAGNOSIS — I5032 Chronic diastolic (congestive) heart failure: Secondary | ICD-10-CM | POA: Diagnosis not present

## 2016-08-22 DIAGNOSIS — E7849 Other hyperlipidemia: Secondary | ICD-10-CM

## 2016-08-22 NOTE — Patient Instructions (Signed)
Medication Instructions:  None  Labwork: None  Testing/Procedures: Your physician has requested that you have a lower or upper extremity arterial duplex. This test is an ultrasound of the arteries in the legs or arms. It looks at arterial blood flow in the legs and arms. Allow one hour for Lower and Upper Arterial scans. There are no restrictions or special instructions  Follow-Up: Your physician wants you to follow-up in: 1 year with Dr. Tamala Julian.  You will receive a reminder letter in the mail two months in advance. If you don't receive a letter, please call our office to schedule the follow-up appointment.   Any Other Special Instructions Will Be Listed Below (If Applicable).     If you need a refill on your cardiac medications before your next appointment, please call your pharmacy.

## 2016-08-23 ENCOUNTER — Other Ambulatory Visit: Payer: Self-pay | Admitting: Interventional Cardiology

## 2016-08-23 DIAGNOSIS — I739 Peripheral vascular disease, unspecified: Secondary | ICD-10-CM

## 2016-08-26 ENCOUNTER — Ambulatory Visit (HOSPITAL_COMMUNITY)
Admission: RE | Admit: 2016-08-26 | Discharge: 2016-08-26 | Disposition: A | Discharge status: Home / Self Care | Payer: Medicare Other | Source: Ambulatory Visit | Attending: Cardiovascular Disease | Admitting: Cardiovascular Disease

## 2016-08-26 DIAGNOSIS — I10 Essential (primary) hypertension: Secondary | ICD-10-CM | POA: Insufficient documentation

## 2016-08-26 DIAGNOSIS — E785 Hyperlipidemia, unspecified: Secondary | ICD-10-CM | POA: Insufficient documentation

## 2016-08-26 DIAGNOSIS — I739 Peripheral vascular disease, unspecified: Secondary | ICD-10-CM | POA: Insufficient documentation

## 2016-08-26 DIAGNOSIS — Z87891 Personal history of nicotine dependence: Secondary | ICD-10-CM | POA: Insufficient documentation

## 2016-09-15 ENCOUNTER — Ambulatory Visit (INDEPENDENT_AMBULATORY_CARE_PROVIDER_SITE_OTHER): Payer: Medicare Other | Admitting: Podiatry

## 2016-09-15 ENCOUNTER — Encounter: Payer: Self-pay | Admitting: Podiatry

## 2016-09-15 ENCOUNTER — Ambulatory Visit (INDEPENDENT_AMBULATORY_CARE_PROVIDER_SITE_OTHER): Payer: Medicare Other

## 2016-09-15 DIAGNOSIS — M779 Enthesopathy, unspecified: Secondary | ICD-10-CM

## 2016-09-15 DIAGNOSIS — M79671 Pain in right foot: Secondary | ICD-10-CM | POA: Diagnosis not present

## 2016-09-15 MED ORDER — TRIAMCINOLONE ACETONIDE 10 MG/ML IJ SUSP: 10.0000 mg | Freq: Once | INTRAMUSCULAR | Status: DC

## 2016-09-15 NOTE — Progress Notes (Signed)
   Subjective:    Patient ID: Monica [name], female    DOB: 12/16/1935, 81 y.o.   MRN: [id number]  HPI  Chief Complaint  Patient presents with  . Foot Pain    Rt on top close to toes toward outside and sometimes ankle area 10 yrs       Review of Systems  Constitutional: Positive for appetite change, fatigue and unexpected weight change.  HENT: Positive for postnasal drip.   Respiratory: Positive for apnea, cough and shortness of breath.   Cardiovascular: Positive for leg swelling.  Gastrointestinal: Positive for abdominal distention.  Musculoskeletal: Positive for arthralgias, gait problem and myalgias.  Skin: Positive for rash.  Psychiatric/Behavioral: Positive for behavioral problems and confusion.  All other systems reviewed and are negative.      Objective:   Physical Exam        Assessment & Plan:

## 2016-09-16 NOTE — Progress Notes (Signed)
Subjective:    Patient ID: Monica [name], female   DOB: 81 y.o.   MRN: [id number]   HPI patient presents stating that she's had swelling in the top of the right foot for a long time and is developed discomfort and she does have obesity which is been a complicating factor and presents with daughter    Review of Systems  All other systems reviewed and are negative.       Objective:  Physical Exam  Cardiovascular: Intact distal pulses.   Musculoskeletal: Normal range of motion.  Neurological: She is alert.  Skin: Skin is warm.  Nursing note and vitals reviewed.  neurovascular status intact muscle strength adequate range of motion within normal limits with patient found to have edema in the dorsum of the right foot with pain mostly around the third metatarsal distal shaft and into the metatarsophalangeal joint with mild range of motion loss and no equinus condition noted currently. Patient has negative Homans sign was found have good digital perfusion     Assessment:    Inflammatory condition with probability for moderate grade chronic edema of the foot and ankle with possibility for vein disease     Plan:    H&P conditions reviewed. Today I went ahead and injected the forefoot around the third metatarsal phalangeal joint 3 mg Kenalog 5 mill grams Xylocaine and went ahead and I applied ankle compression stocking with instructions on elevation and patient will be seen back and may require venous consult depending on response  X-rays indicated no signs of stress fracture with mild to moderate arthritis and no other pathology

## 2016-09-27 ENCOUNTER — Telehealth: Payer: Self-pay | Admitting: Family Medicine

## 2016-09-27 NOTE — Telephone Encounter (Signed)
allopurinol (ZYLOPRIM) 100 MG tablet   Patient is requesting a refill on this medication from [facility]. She states he is one who has been filling it.. Please advise or follow up with patient.   [place], Bandon A [phone] (Phone) [phone] (Fax)

## 2016-09-28 MED ORDER — ALLOPURINOL 100 MG PO TABS: 200.0000 mg | ORAL_TABLET | Freq: Every day | ORAL | 1 refills | Status: DC

## 2016-09-28 NOTE — Telephone Encounter (Signed)
Done, will need to be seen at least yearly

## 2016-09-30 ENCOUNTER — Encounter (HOSPITAL_COMMUNITY): Payer: Self-pay | Admitting: Nurse Practitioner

## 2016-09-30 ENCOUNTER — Emergency Department (HOSPITAL_COMMUNITY): Payer: Medicare Other

## 2016-09-30 ENCOUNTER — Telehealth: Payer: Self-pay | Admitting: *Deleted

## 2016-09-30 ENCOUNTER — Observation Stay (HOSPITAL_COMMUNITY)
Admission: EM | Admit: 2016-09-30 | Discharge: 2016-10-02 | Disposition: A | Discharge status: Home / Self Care | Payer: Medicare Other | Attending: Internal Medicine | Admitting: Internal Medicine

## 2016-09-30 DIAGNOSIS — I5032 Chronic diastolic (congestive) heart failure: Secondary | ICD-10-CM | POA: Diagnosis not present

## 2016-09-30 DIAGNOSIS — Z96653 Presence of artificial knee joint, bilateral: Secondary | ICD-10-CM | POA: Insufficient documentation

## 2016-09-30 DIAGNOSIS — Z79899 Other long term (current) drug therapy: Secondary | ICD-10-CM | POA: Insufficient documentation

## 2016-09-30 DIAGNOSIS — G4733 Obstructive sleep apnea (adult) (pediatric): Secondary | ICD-10-CM | POA: Insufficient documentation

## 2016-09-30 DIAGNOSIS — M109 Gout, unspecified: Secondary | ICD-10-CM | POA: Diagnosis present

## 2016-09-30 DIAGNOSIS — K219 Gastro-esophageal reflux disease without esophagitis: Secondary | ICD-10-CM | POA: Insufficient documentation

## 2016-09-30 DIAGNOSIS — Z6839 Body mass index (BMI) 39.0-39.9, adult: Secondary | ICD-10-CM | POA: Insufficient documentation

## 2016-09-30 DIAGNOSIS — K921 Melena: Secondary | ICD-10-CM | POA: Diagnosis present

## 2016-09-30 DIAGNOSIS — K5731 Diverticulosis of large intestine without perforation or abscess with bleeding: Secondary | ICD-10-CM | POA: Diagnosis not present

## 2016-09-30 DIAGNOSIS — K922 Gastrointestinal hemorrhage, unspecified: Secondary | ICD-10-CM | POA: Diagnosis present

## 2016-09-30 DIAGNOSIS — I10 Essential (primary) hypertension: Secondary | ICD-10-CM | POA: Diagnosis present

## 2016-09-30 DIAGNOSIS — Z87891 Personal history of nicotine dependence: Secondary | ICD-10-CM | POA: Insufficient documentation

## 2016-09-30 DIAGNOSIS — D500 Iron deficiency anemia secondary to blood loss (chronic): Secondary | ICD-10-CM | POA: Insufficient documentation

## 2016-09-30 DIAGNOSIS — I110 Hypertensive heart disease with heart failure: Secondary | ICD-10-CM | POA: Insufficient documentation

## 2016-09-30 DIAGNOSIS — R0600 Dyspnea, unspecified: Secondary | ICD-10-CM

## 2016-09-30 DIAGNOSIS — K5730 Diverticulosis of large intestine without perforation or abscess without bleeding: Secondary | ICD-10-CM

## 2016-09-30 LAB — CBC
HCT: 34.5 % — ABNORMAL LOW (ref 36.0–46.0)
Hemoglobin: 11.2 g/dL — ABNORMAL LOW (ref 12.0–15.0)
MCH: 28.1 pg (ref 26.0–34.0)
MCHC: 32.5 g/dL (ref 30.0–36.0)
MCV: 86.5 fL (ref 78.0–100.0)
Platelets: 229 10*3/uL (ref 150–400)
RBC: 3.99 MIL/uL (ref 3.87–5.11)
RDW: 15.2 % (ref 11.5–15.5)
WBC: 8.3 10*3/uL (ref 4.0–10.5)

## 2016-09-30 LAB — CBC WITH DIFFERENTIAL/PLATELET
Basophils Absolute: 0.1 10*3/uL (ref 0.0–0.1)
Basophils Relative: 1 %
Eosinophils Absolute: 0.2 10*3/uL (ref 0.0–0.7)
Eosinophils Relative: 2 %
HCT: 34.3 % — ABNORMAL LOW (ref 36.0–46.0)
Hemoglobin: 11.2 g/dL — ABNORMAL LOW (ref 12.0–15.0)
Lymphocytes Relative: 30 %
Lymphs Abs: 2.4 10*3/uL (ref 0.7–4.0)
MCH: 28.4 pg (ref 26.0–34.0)
MCHC: 32.7 g/dL (ref 30.0–36.0)
MCV: 87.1 fL (ref 78.0–100.0)
Monocytes Absolute: 0.6 10*3/uL (ref 0.1–1.0)
Monocytes Relative: 7 %
Neutro Abs: 4.9 10*3/uL (ref 1.7–7.7)
Neutrophils Relative %: 60 %
Platelets: 231 10*3/uL (ref 150–400)
RBC: 3.94 MIL/uL (ref 3.87–5.11)
RDW: 15.2 % (ref 11.5–15.5)
WBC: 8.1 10*3/uL (ref 4.0–10.5)

## 2016-09-30 LAB — COMPREHENSIVE METABOLIC PANEL
ALT: 16 U/L (ref 14–54)
AST: 20 U/L (ref 15–41)
Albumin: 4 g/dL (ref 3.5–5.0)
Alkaline Phosphatase: 83 U/L (ref 38–126)
Anion gap: 6 (ref 5–15)
BUN: 21 mg/dL — ABNORMAL HIGH (ref 6–20)
CO2: 28 mmol/L (ref 22–32)
Calcium: 9.2 mg/dL (ref 8.9–10.3)
Chloride: 109 mmol/L (ref 101–111)
Creatinine, Ser: 0.73 mg/dL (ref 0.44–1.00)
GFR calc Af Amer: >60 mL/min (ref >60)
GFR calc non Af Amer: >60 mL/min (ref >60)
Glucose, Bld: 94 mg/dL (ref 65–99)
Potassium: 4.2 mmol/L (ref 3.5–5.1)
Sodium: 143 mmol/L (ref 135–145)
Total Bilirubin: 0.4 mg/dL (ref 0.3–1.2)
Total Protein: 6.9 g/dL (ref 6.5–8.1)

## 2016-09-30 LAB — TYPE AND SCREEN
ABO/RH(D): O POS
Antibody Screen: NEGATIVE

## 2016-09-30 LAB — PROTIME-INR
INR: 0.9
Prothrombin Time: 12.2 seconds (ref 11.4–15.2)

## 2016-09-30 LAB — GLUCOSE, CAPILLARY
Glucose-Capillary: 79 mg/dL (ref 65–99)
Glucose-Capillary: 91 mg/dL (ref 65–99)

## 2016-09-30 LAB — POC OCCULT BLOOD, ED: Fecal Occult Bld: POSITIVE — AB

## 2016-09-30 LAB — I-STAT CG4 LACTIC ACID, ED: Lactic Acid, Venous: 0.8 mmol/L (ref 0.5–1.9)

## 2016-09-30 LAB — LIPASE, BLOOD: Lipase: 29 U/L (ref 11–51)

## 2016-09-30 MED ORDER — SODIUM CHLORIDE 0.9 % IV SOLN: INTRAVENOUS | Status: DC

## 2016-09-30 MED ORDER — ACETAMINOPHEN 325 MG PO TABS: 650.0000 mg | ORAL_TABLET | Freq: Four times a day (QID) | ORAL | Status: DC | PRN

## 2016-09-30 MED ORDER — SODIUM CHLORIDE 0.9 % IV SOLN
80.0000 mg | Freq: Once | INTRAVENOUS | Status: AC
  Administered 2016-09-30: 80 mg via INTRAVENOUS
  Filled 2016-09-30: qty 80

## 2016-09-30 MED ORDER — PEG 3350-KCL-NA BICARB-NACL 420 G PO SOLR
4000.0000 mL | Freq: Once | ORAL | Status: AC
  Administered 2016-09-30: 4000 mL via ORAL
  Filled 2016-09-30: qty 4000

## 2016-09-30 MED ORDER — SODIUM CHLORIDE 0.9 % IV BOLUS (SEPSIS)
500.0000 mL | Freq: Once | INTRAVENOUS | Status: AC
  Administered 2016-09-30: 500 mL via INTRAVENOUS

## 2016-09-30 MED ORDER — BISACODYL 10 MG RE SUPP: 10.0000 mg | Freq: Every day | RECTAL | Status: DC | PRN

## 2016-09-30 MED ORDER — ACETAMINOPHEN 650 MG RE SUPP: 650.0000 mg | Freq: Four times a day (QID) | RECTAL | Status: DC | PRN

## 2016-09-30 MED ORDER — HYDRALAZINE HCL 20 MG/ML IJ SOLN
20.0000 mg | INTRAMUSCULAR | Status: DC | PRN
  Filled 2016-09-30: qty 1

## 2016-09-30 MED ORDER — PANTOPRAZOLE SODIUM 40 MG IV SOLR
40.0000 mg | Freq: Two times a day (BID) | INTRAVENOUS | Status: DC
  Administered 2016-09-30 – 2016-10-02 (×3): 40 mg via INTRAVENOUS
  Filled 2016-09-30 (×3): qty 40

## 2016-09-30 MED ORDER — SODIUM CHLORIDE 0.9 % IV SOLN: Freq: Once | INTRAVENOUS | Status: DC

## 2016-09-30 MED ORDER — METOPROLOL SUCCINATE ER 50 MG PO TB24
100.0000 mg | ORAL_TABLET | Freq: Every day | ORAL | Status: DC
  Administered 2016-09-30 – 2016-10-02 (×2): 100 mg via ORAL
  Filled 2016-09-30 (×2): qty 2

## 2016-09-30 NOTE — Consult Note (Signed)
Reason for Consult: Hematochezia and abdominal pain Referring Physician: [facility]  Madason Nelson HPI: This is a 81 year old female with a PMH of diverticula, HTN, GERD, obesity, and cardiomegaly admitted to the hospital for GI bleeding.  Her symptoms started this past Monday after she ate at a restaurant.  She initially thought that the hematochezia was not truly bleeding, but the curry that she ate the day before.  Throughout the week she continued to have further and progressive bleeding.  Last evening was the worst in that she had an increase in frequency and volume.  She tried to remedy the situation with a laxative, but her symptoms did not improve.  As a result she presented to the ER and she was noted by the ER physician to have "cranberry" colored stool.  Her records state and she confirmed two colonoscopies in 2002 and 2006 with findings of diverticula.  She does not recall the indications for the procedures.  In addition, she reports some upper abdominal and chest type discomfort, which is associated with dysphagia.  These symptoms are not new, but she started to have a worsening of these symptoms along with her current bleeding.  Past Medical History:  Diagnosis Date  . Allergic rhinitis   . Anxiety   . Benign neoplasm of breast 2013  . Cardiomegaly   . Compulsive overeating   . Depression   . Diffuse cystic mastopathy 2013  . Edema   . Family history of malignant neoplasm of breast   . Gout   . HTN (hypertension)   . Hyperlipidemia   . Lump or mass in breast 2012  . OA (osteoarthritis) of knee    with injections  . Obesity   . Other abnormal glucose   . Personal history of tobacco use, presenting hazards to health   . Sciatica   . Sinus infection 2010  . Unspecified sleep apnea     Past Surgical History:  Procedure Laterality Date  . adenosine cardiolite  1/08   low risk   . APPENDECTOMY    . BREAST LUMPECTOMY     right x1 ('89) left x2 ('70s, '90)  .  CARDIAC CATHETERIZATION  2001   normal per pt  . COLONOSCOPY  11/02   diverticulosis  . COLONOSCOPY  9/06   diverticulosis, hemorhoids  . dexa  3/02   normal  . EYE SURGERY  2012   cataract  . knee replacement Right 9/11   R. Dr. Telford Nab  . sleep study  5/05  . TONSILLECTOMY    . TOTAL ABDOMINAL HYSTERECTOMY     fibroids, age of 56  . TOTAL KNEE ARTHROPLASTY Left 2012  . US TRANSVAGINAL PELVIC MODIFIED  2000, 2001    Family History  Problem Relation Age of Onset  . Stroke Father   . Hypertension Mother        (a lot of animosity in their relationship)  . Heart failure Mother   . Gout Unknown        whole family   . Prostate cancer Brother   . Hepatitis Brother        Hep C; alcoholism (terminal)  . Breast cancer Sister   . Other Brother        drug addiction    Social History:  reports that she has quit smoking. Her smoking use included Cigarettes. She has a 10.00 pack-year smoking history. She has never used smokeless tobacco. She reports that she drinks alcohol. She reports that she  does not use drugs.  Allergies:  Allergies  Allergen Reactions  . Buspirone Hcl     REACTION: made her sleepy, ? swollen legs  . Lisinopril     REACTION: dizziness  . Prozac [Fluoxetine Hcl]     sleepy    Medications:  Scheduled:  Continuous: . sodium chloride    . sodium chloride      Results for orders placed or performed during the hospital encounter of 09/30/16 (from the past 24 hour(s))  Comprehensive metabolic panel     Status: Abnormal   Collection Time: 09/30/16  2:24 PM  Result Value Ref Range   Sodium 143 135 - 145 mmol/L   Potassium 4.2 3.5 - 5.1 mmol/L   Chloride 109 101 - 111 mmol/L   CO2 28 22 - 32 mmol/L   Glucose, Bld 94 65 - 99 mg/dL   BUN 21 (H) 6 - 20 mg/dL   Creatinine, Ser 0.73 0.44 - 1.00 mg/dL   Calcium 9.2 8.9 - 10.3 mg/dL   Total Protein 6.9 6.5 - 8.1 g/dL   Albumin 4.0 3.5 - 5.0 g/dL   AST 20 15 - 41 U/L   ALT 16 14 - 54 U/L   Alkaline  Phosphatase 83 38 - 126 U/L   Total Bilirubin 0.4 0.3 - 1.2 mg/dL   GFR calc non Af Amer >60 >60 mL/min   GFR calc Af Amer >60 >60 mL/min   Anion gap 6 5 - 15  CBC WITH DIFFERENTIAL     Status: Abnormal   Collection Time: 09/30/16  2:24 PM  Result Value Ref Range   WBC 8.1 4.0 - 10.5 K/uL   RBC 3.94 3.87 - 5.11 MIL/uL   Hemoglobin 11.2 (L) 12.0 - 15.0 g/dL   HCT 34.3 (L) 36.0 - 46.0 %   MCV 87.1 78.0 - 100.0 fL   MCH 28.4 26.0 - 34.0 pg   MCHC 32.7 30.0 - 36.0 g/dL   RDW 15.2 11.5 - 15.5 %   Platelets 231 150 - 400 K/uL   Neutrophils Relative % 60 %   Neutro Abs 4.9 1.7 - 7.7 K/uL   Lymphocytes Relative 30 %   Lymphs Abs 2.4 0.7 - 4.0 K/uL   Monocytes Relative 7 %   Monocytes Absolute 0.6 0.1 - 1.0 K/uL   Eosinophils Relative 2 %   Eosinophils Absolute 0.2 0.0 - 0.7 K/uL   Basophils Relative 1 %   Basophils Absolute 0.1 0.0 - 0.1 K/uL  Lipase, blood     Status: None   Collection Time: 09/30/16  2:24 PM  Result Value Ref Range   Lipase 29 11 - 51 U/L  Protime-INR     Status: None   Collection Time: 09/30/16  2:24 PM  Result Value Ref Range   Prothrombin Time 12.2 11.4 - 15.2 seconds   INR 0.90   POC occult blood, ED Provider will collect     Status: Abnormal   Collection Time: 09/30/16  2:34 PM  Result Value Ref Range   Fecal Occult Bld POSITIVE (A) NEGATIVE  I-Stat CG4 Lactic Acid, ED  (not at [facility])     Status: None   Collection Time: 09/30/16  2:36 PM  Result Value Ref Range   Lactic Acid, Venous 0.80 0.5 - 1.9 mmol/L     Dg Chest Port 1 View  Result Date: 09/30/2016 CLINICAL DATA:  Dyspnea EXAM: PORTABLE CHEST 1 VIEW COMPARISON:  10/20/2009 FINDINGS: Cardiac shadow is mildly enlarged but accentuated by the  portable technique. The lungs are clear bilaterally. No bony abnormality is seen. IMPRESSION: No active disease. Electronically Signed   By: Inez Catalina M.D.   On: 09/30/2016 14:36    ROS:  As stated above in the HPI otherwise negative.  Blood pressure (!)  154/84, pulse 82, temperature 98 F (36.7 C), temperature source Oral, resp. rate 18, height 5\' 7"  (1.702 m), weight 116.1 kg (256 lb), SpO2 100 %.    PE: Gen: NAD, Alert and Oriented HEENT:  [place]/AT, EOMI Neck: Supple, no LAD Lungs: CTA Bilaterally CV: RRR without M/G/R ABM: Soft, obese, NTND, +BS Ext: No C/C/E  Assessment/Plan: 1) Hematochezia. 2) GERD. 3) Diverticula. 4) Anemia.   I believe the patient has a diverticular bleed, but with her upper symptoms an upper GI source needs to be considered.  It has been some time since her last colonoscopy and she will benefit with an EGD/Colonoscopy tomorrow.  Plan: 1) EGD/Colonoscopy tomorrow. 2) Follow HGB and transfuse as necessary.   Jeliyah Middlebrooks D 09/30/2016, 4:46 PM

## 2016-09-30 NOTE — Telephone Encounter (Signed)
Patient Name: Monica [name] DOB: 1935-11-11 Initial Comment Caller states she's on day number five with bloody stools, feels tightness in her chest. Stool is black and bloody, she took a laxative to try to help it, it's getting worse. Nurse Assessment Nurse: Yolanda Bonine, RN, Erin Date/Time Eilene Ghazi Time): 09/30/2016 12:02:31 PM Confirm and document reason for call. If symptomatic, describe symptoms. ---Caller states she's on day number five with bloody stools, feels tightness in her chest. Stool is black and bloody, she took a laxative to try to help it, it's getting worse. Does the patient have any new or worsening symptoms? ---Yes Will a triage be completed? ---Yes Related visit to physician within the last 2 weeks? ---No Does the PT have any chronic conditions? (i.e. diabetes, asthma, etc.) ---Yes List chronic conditions. ---HTN Is this a behavioral health or substance abuse call? ---No Guidelines Guideline Title Affirmed Question Affirmed Notes Chest Pain [1] Chest pain lasts > 5 minutes AND [2] described as crushing, pressure-like, or heavy Final Disposition User Call EMS 911 Now Yolanda Bonine, RN, Erin Disagree/Comply: Leta Baptist

## 2016-09-30 NOTE — Telephone Encounter (Signed)
Patient has had bloody stool for five days. The issue has worsen on today with increase blood along with pain and pressure. Patient took a laxative on 09/29/16 thinking this would help  Pt transferred to [facility]

## 2016-09-30 NOTE — ED Provider Notes (Signed)
[place] [facility] DEPT Provider Note   CSN: [id number] Arrival date & time: 09/30/16  1303     History   Chief Complaint No chief complaint on file.   HPI Grisela Mesch is a 81 y.o. female.  HPI Patient reports that she started noting blood in her stool 5 days ago. She had eaten curry [employer] and everybody said it was because of the [place]. She however continued to have another episode of red stool on Tuesday. On Wednesday and Thursday seem to get darker and thicker. Patient does endorse that she has felt more short of breath and usual. She reports even minor activities like getting out of the car get her very winded. She has not had any vomiting but does note she feels like she has had some burning in her epigastrium and that food has felt like it burns going down sometimes. She reports once very long time ago she was told she had diverticulosis. He denies any known history of GI bleed. She denies being on any anticoagulants. She denies daily aspirin. She denies syncope. Past Medical History:  Diagnosis Date  . Allergic rhinitis   . Anxiety   . Benign neoplasm of breast 2013  . Cardiomegaly   . Compulsive overeating   . Depression   . Diffuse cystic mastopathy 2013  . Edema   . Family history of malignant neoplasm of breast   . Gout   . HTN (hypertension)   . Hyperlipidemia   . Lump or mass in breast 2012  . OA (osteoarthritis) of knee    with injections  . Obesity   . Other abnormal glucose   . Personal history of tobacco use, presenting hazards to health   . Sciatica   . Sinus infection 2010  . Unspecified sleep apnea     Patient Active Problem List   Diagnosis Date Noted  . Chronic cough 04/15/2016  . Seborrheic keratoses 04/15/2016  . Cervical disc disorder with radiculopathy of cervical region 12/01/2015  . Partial nontraumatic rupture of right rotator cuff 09/22/2015  . Compulsive overeating 01/01/2015  . Chronic diastolic heart failure (HCC) 12/12/2012   Class: Chronic  . Urge incontinence of urine 09/09/2011  . Knee osteoarthritis 02/09/2010  . Gout 03/20/2008  . Hyperlipidemia 12/21/2006  . Morbid obesity ([place]) 12/21/2006  . Depression with anxiety 12/21/2006  . Essential hypertension 12/21/2006  . Obstructive sleep apnea 12/21/2006    Past Surgical History:  Procedure Laterality Date  . adenosine cardiolite  1/08   low risk   . APPENDECTOMY    . BREAST LUMPECTOMY     right x1 ('89) left x2 ('70s, '90)  . CARDIAC CATHETERIZATION  2001   normal per pt  . COLONOSCOPY  11/02   diverticulosis  . COLONOSCOPY  9/06   diverticulosis, hemorhoids  . dexa  3/02   normal  . EYE SURGERY  2012   cataract  . knee replacement Right 9/11   R. Dr. Telford Nab  . sleep study  5/05  . TONSILLECTOMY    . TOTAL ABDOMINAL HYSTERECTOMY     fibroids, age of 45  . TOTAL KNEE ARTHROPLASTY Left 2012  . US TRANSVAGINAL PELVIC MODIFIED  2000, 2001    OB History    Gravida Para Term Preterm AB Living   2       1 1    SAB TAB Ectopic Multiple Live Births   1              Obstetric Comments  Age with first menstruation-14 Age with first pregnancy-22 LMP-age 41, hysterectomy       Home Medications    Prior to Admission medications   Medication Sig Start Date End Date Taking? Authorizing Provider  ACCU-CHEK AVIVA PLUS test strip CHECK GLUCOSE WITH [facility] MEAL AND AT BEDTIME AS DIRECTED 11/28/14   Tower, Wynelle Fanny, MD  ACCU-CHEK SOFTCLIX LANCETS lancets Check blood sugar once daily and as needed. Dx R73.01 02/03/15   Tower, Wynelle Fanny, MD  allopurinol (ZYLOPRIM) 100 MG tablet Take 2 tablets (200 mg total) by mouth daily. 09/28/16   Lyndal Pulley, DO  amLODipine (NORVASC) 5 MG tablet TAKE 1 TABLET BY MOUTH DAILY 08/08/16   Belva Crome, MD  amoxicillin (AMOXIL) 500 MG capsule Take 2,000 mg by mouth as needed (Take one (1) hour prior to dental procedures.).    [provider]  calcium carbonate (OS-CAL) 600 MG TABS Take 600 mg by mouth  daily.     [provider]  Cholecalciferol (VITAMIN D3) 2000 units capsule Take 2,000 Units by mouth daily.    [provider]  furosemide (LASIX) 40 MG tablet TAKE 1 TABLET BY MOUTH DAILY 08/08/16   Thersa Salt G, DO  gabapentin (NEURONTIN) 100 MG capsule Take 2 capsules (200 mg total) by mouth at bedtime. Patient not taking: Reported on 08/22/2016 03/30/16   Lyndal Pulley, DO  losartan (COZAAR) 50 MG tablet TAKE 1 TABLET BY MOUTH DAILY 06/06/16   [name], DO  MAGNESIUM CARBONATE PO Take 250 mg by mouth daily.    [provider]  metoprolol succinate (TOPROL-XL) 100 MG 24 hr tablet TAKE 1 TABLET BY MOUTH DAILY WITH OR IMMEDIATLY FOLLOWING A MEAL 06/06/16   Cook, Golden G, DO  Omega-3 Fatty Acids (FISH OIL PO) Take 500 capsules by mouth daily.     [provider]  potassium chloride SA (K-DUR,KLOR-CON) 20 MEQ tablet Take 1 tablet (20 mEq total) by mouth daily. 02/26/16   [name], DO    Family History Family History  Problem Relation Age of Onset  . Stroke Father   . Hypertension Mother        (a lot of animosity in their relationship)  . Heart failure Mother   . Gout Unknown        whole family   . Prostate cancer Brother   . Hepatitis Brother        Hep C; alcoholism (terminal)  . Breast cancer Sister   . Other Brother        drug addiction    Social History Social History  Substance Use Topics  . Smoking status: Former Smoker    Packs/day: 0.50    Years: 20.00    Types: Cigarettes  . Smokeless tobacco: Never Used     Comment: quit approx. 20 years ago  . Alcohol use 0.0 oz/week     Comment: wine occasional     Allergies   Buspirone hcl; Lisinopril; and Prozac [fluoxetine hcl]   Review of Systems Review of Systems 10 Systems reviewed and are negative for acute change except as noted in the HPI.   Physical Exam Updated Vital Signs BP (!) 159/104 (BP Location: Right Arm)   Pulse 75   Temp 97.9 F (36.6 C) (Oral)   Resp  18   Ht 5\' 7"  (1.702 m)   Wt 116.1 kg (256 lb)   SpO2 99%   BMI 40.10 kg/m   Physical Exam  Constitutional: She  is oriented to person, place, and time.  Patient is alert and nontoxic normal mental status. No respiratory distress at rest however as I had the patient repositioned significantly to do parts of the exam she did become dyspneic even with minor activity.  HENT:  Head: Normocephalic and atraumatic.  Mouth/Throat: Oropharynx is clear and moist.  Eyes: EOM are normal.  Cardiovascular: Normal rate, regular rhythm, normal heart sounds and intact distal pulses.   Pulmonary/Chest: Effort normal and breath sounds normal.  Abdominal: Soft. Bowel sounds are normal. She exhibits no distension and no mass. There is no tenderness. There is no guarding.  Genitourinary:  Genitourinary Comments: Digital rectal exam cranberry melanotic stool in the vault.  Musculoskeletal: Normal range of motion. She exhibits edema. She exhibits no tenderness.  1+ edema bilateral feet  Neurological: She is alert and oriented to person, place, and time. No cranial nerve deficit. She exhibits normal muscle tone. Coordination normal.  Skin: Skin is warm and dry. There is pallor.  Psychiatric: She has a normal mood and affect.     ED Treatments / Results  Labs (all labs ordered are listed, but only abnormal results are displayed) Labs Reviewed  COMPREHENSIVE METABOLIC PANEL  CBC WITH DIFFERENTIAL/PLATELET  LIPASE, BLOOD  PROTIME-INR  POC OCCULT BLOOD, ED  I-STAT CG4 LACTIC ACID, ED  TYPE AND SCREEN    EKG  EKG Interpretation None       Radiology No results found.  Procedures Procedures (including critical care time)  Medications Ordered in ED Medications  pantoprazole (PROTONIX) 80 mg in sodium chloride 0.9 % 100 mL IVPB (not administered)  0.9 %  sodium chloride infusion (not administered)     Initial Impression / Assessment and Plan / ED Course  I have reviewed the triage vital  signs and the nursing notes.  Pertinent labs & imaging results that were available during my care of the patient were reviewed by me and considered in my medical decision making (see chart for details).     Consult: Hospitalist for admission Consult: GI (16:24) will evaluate the patient in consultation.  Final Clinical Impressions(s) / ED Diagnoses   Final diagnoses:  Dyspnea Lower GI Bleed    New Prescriptions New Prescriptions   No medications on file     Charlesetta Shanks, MD 10/03/16 1054

## 2016-09-30 NOTE — ED Notes (Signed)
Bed: WA21 Expected date:  Expected time:  Means of arrival:  Comments: EMS 81 y/o rectal bleeding

## 2016-09-30 NOTE — ED Notes (Signed)
Pt care assumed, pt is resting comfortably with family at bedside.  Pt denies any complaints at this time.

## 2016-09-30 NOTE — ED Notes (Signed)
GI MD at bedside

## 2016-09-30 NOTE — ED Triage Notes (Signed)
Patient coming from home for a rectal bleed that started on Monday. Started off as bright red bleeding. Throughout the week it has darkened up and thickened up and now it is black tarry stool. Patient has felt lightheaded and dizzy today.

## 2016-09-30 NOTE — Progress Notes (Signed)
Call returned to receive report.

## 2016-09-30 NOTE — Telephone Encounter (Signed)
FYI , patient advised for ED. thanks

## 2016-09-30 NOTE — ED Notes (Signed)
Called 3W to given report, Anderson Malta states receiving nurse is busy and will return call.

## 2016-09-30 NOTE — H&P (Signed)
History and Physical:    Monica [name]   JTT:[id number] DOB: 02-27-1935 DOA: 09/30/2016  Referring MD/provider: Dr. Vallery Ridge PCP: [name], DO   Patient coming from:  Home  Chief Complaint: bright red blood per rectum and melena 5 days.  History of Present Illness:   Monica [name] is an 81 y.o. female With past medical history significant for hypertension and obesity who was in her usual state of reasonably good health until 5 days ago when she noted bright red E stools. She initially thought those were secondary to eating coronary earlier that day. The following 2 days she noted that her stools were getting darker but did not think much of it. The day before admission patient noted that her stools were very dark and she also noted there were red clots in them. On day of admission patient called her PCP to make sure that it was still okay for her to travel to visit family that day. She admitted to feeling somewhat dizzy more than usual. PCP instructed her to call 911 and come to ED.  Patient denies any chest pain or shortness of breath different from baseline. Denies any palpitations. Denies headache. No nausea or vomiting  ED Course:  The patient  was noted to be hemodynamically stable with H&H minimally decreased from her baseline. She was treated with IV pantoprazole and IV hydration. GI consultation was called.  ROS:   ROS: Patient denies fevers or chills. No chest pain or palpitations. Shortness of breath is without change at baseline. No nausea or vomiting. No dysuria. No back pain different from baseline.  Past Medical History:   Past Medical History:  Diagnosis Date  . Allergic rhinitis   . Anxiety   . Benign neoplasm of breast 2013  . Cardiomegaly   . Compulsive overeating   . Depression   . Diffuse cystic mastopathy 2013  . Edema   . Family history of malignant neoplasm of breast   . Gout   . HTN (hypertension)   . Hyperlipidemia   . Lump or mass in  breast 2012  . OA (osteoarthritis) of knee    with injections  . Obesity   . Other abnormal glucose   . Personal history of tobacco use, presenting hazards to health   . Sciatica   . Sinus infection 2010  . Unspecified sleep apnea     Past Surgical History:   Past Surgical History:  Procedure Laterality Date  . adenosine cardiolite  1/08   low risk   . APPENDECTOMY    . BREAST LUMPECTOMY     right x1 ('89) left x2 ('70s, '90)  . CARDIAC CATHETERIZATION  2001   normal per pt  . COLONOSCOPY  11/02   diverticulosis  . COLONOSCOPY  9/06   diverticulosis, hemorhoids  . dexa  3/02   normal  . EYE SURGERY  2012   cataract  . knee replacement Right 9/11   R. Dr. Telford Nab  . sleep study  5/05  . TONSILLECTOMY    . TOTAL ABDOMINAL HYSTERECTOMY     fibroids, age of 78  . TOTAL KNEE ARTHROPLASTY Left 2012  . US TRANSVAGINAL PELVIC MODIFIED  2000, 2001    Social History:   Social History   Social History  . Marital status: Widowed    Spouse name: N/A  . Number of children: 1  . Years of education: N/A   Occupational History  . Retired school principal Retired   Science writer History  Main Topics  . Smoking status: Former Smoker    Packs/day: 0.50    Years: 20.00    Types: Cigarettes  . Smokeless tobacco: Never Used     Comment: quit approx. 20 years ago  . Alcohol use 0.0 oz/week     Comment: wine occasional  . Drug use: No  . Sexual activity: No   Other Topics Concern  . Not on file   Social History Narrative   Widowed, 1 daughter. 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 Stroke Father   . Hypertension Mother        (a lot of animosity in their relationship)  . Heart failure Mother   . Gout Unknown        whole family   . Prostate cancer Brother   . Hepatitis Brother        Hep C;  alcoholism (terminal)  . Breast cancer Sister   . Other Brother        drug addiction    Current Medications:   Prior to Admission medications   Medication Sig Start Date End Date Taking? Authorizing Provider  ACCU-CHEK AVIVA PLUS test strip CHECK GLUCOSE WITH [facility] MEAL AND AT BEDTIME AS DIRECTED 11/28/14  Yes Tower, Wynelle Fanny, MD  ACCU-CHEK SOFTCLIX LANCETS lancets Check blood sugar once daily and as needed. Dx R73.01 02/03/15  Yes Tower, Wynelle Fanny, MD  allopurinol (ZYLOPRIM) 100 MG tablet Take 2 tablets (200 mg total) by mouth daily. 09/28/16  Yes Hulan Saas M, DO  amLODipine (NORVASC) 5 MG tablet TAKE 1 TABLET BY MOUTH DAILY 08/08/16  Yes Belva Crome, MD  calcium carbonate (OS-CAL) 600 MG TABS Take 600 mg by mouth daily.    Yes [provider]  Cholecalciferol (VITAMIN D3) 2000 units capsule Take 2,000 Units by mouth daily.   Yes [provider]  Ferrous Sulfate (IRON) 325 (65 Fe) MG TABS Take 1 tablet by mouth daily.   Yes [provider]  furosemide (LASIX) 40 MG tablet TAKE 1 TABLET BY MOUTH DAILY 08/08/16  Yes Cook, Jayce G, DO  losartan (COZAAR) 50 MG tablet TAKE 1 TABLET BY MOUTH DAILY 06/06/16  Yes Cook, Jayce G, DO  MAGNESIUM CARBONATE PO Take 250 mg by mouth daily.   Yes [provider]  metoprolol succinate (TOPROL-XL) 100 MG 24 hr tablet TAKE 1 TABLET BY MOUTH DAILY WITH OR IMMEDIATLY FOLLOWING A MEAL 06/06/16  Yes Cook, Jayce G, DO  Omega-3 Fatty Acids (FISH OIL PO) Take 500 capsules by mouth daily.    Yes [provider]  potassium chloride SA (K-DUR,KLOR-CON) 20 MEQ tablet Take 1 tablet (20 mEq total) by mouth daily. 02/26/16  Yes Cook, Jayce G, DO  gabapentin (NEURONTIN) 100 MG capsule Take 2 capsules (200 mg total) by mouth at bedtime. Patient not taking: Reported on 08/22/2016 03/30/16   Lyndal Pulley, DO    Physical Exam:   Vitals:   09/30/16 1312 09/30/16 1323 09/30/16 1330 09/30/16 1556  BP: (!) 155/91 (!) 159/104  (!) 154/84   Pulse: 79 75  82  Resp: 18 18  18   Temp:  97.9 F (36.6 C)  98 F (36.7 C)  TempSrc:  Oral  Oral  SpO2: 99% 99%  100%  Weight:  116.1 kg (256 lb)   Height:   5\' 7"  (1.702 m)      Physical Exam: Blood pressure (!) 154/84, pulse 82, temperature 98 F (36.7 C), temperature source Oral, resp. rate 18, height 5\' 7"  (1.702 m), weight 116.1 kg (256 lb), SpO2 100 %. Gen: Pleasant obese female lying in bed in no distress. Head: Normocephalic, atraumatic. Eyes: Nonicteric sclera, conjunctiva slightly pale.. Neck: No JVD. Chest: Lungs are clear to auscultation with good air movement. No rales, rhonchi or wheezes.  CV: Heart sounds are regular with an S1, S2. No murmurs, rubs, clicks, or gallops.  Abdomen:  Normoactive bowel sounds. Obese. + Mild epigastric tenderness to light and deep palpation. Extremities: Extremities are without clubbing, or cyanosis. No edema.  Skin: Warm and dry. No rashes, lesions or wounds. Neuro: Alert and oriented times 3; grossly nonfocal.  Psych: Patient is friendly, cooperative, appropriately concerned. Insight and judgment are good.   Data Review:    Labs: Basic Metabolic Panel:  Recent Labs Lab 09/30/16 1424  NA 143  K 4.2  CL 109  CO2 28  GLUCOSE 94  BUN 21*  CREATININE 0.73  CALCIUM 9.2   Liver Function Tests:  Recent Labs Lab 09/30/16 1424  AST 20  ALT 16  ALKPHOS 83  BILITOT 0.4  PROT 6.9  ALBUMIN 4.0    Recent Labs Lab 09/30/16 1424  LIPASE 29   No results for input(s): AMMONIA in the last 168 hours. CBC:  Recent Labs Lab 09/30/16 1424  WBC 8.1  NEUTROABS 4.9  HGB 11.2*  HCT 34.3*  MCV 87.1  PLT 231   Cardiac Enzymes: No results for input(s): CKTOTAL, CKMB, CKMBINDEX, TROPONINI in the last 168 hours.  BNP (last 3 results) No results for input(s): PROBNP in the last 8760 hours. CBG: No results for input(s): GLUCAP in the last 168 hours.  Urinalysis    Component Value Date/Time   COLORURINE AMBER  BIOCHEMICALS MAY BE AFFECTED BY COLOR (A) 04/23/2010 1210   APPEARANCEUR CLOUDY (A) 04/23/2010 1210   LABSPEC 1.027 04/23/2010 1210   PHURINE 6.5 04/23/2010 1210   GLUCOSEU NEGATIVE 04/23/2010 1210   HGBUR NEGATIVE 04/23/2010 1210   BILIRUBINUR NEGATIVE 04/23/2010 1210   KETONESUR TRACE (A) 04/23/2010 1210   PROTEINUR NEGATIVE 04/23/2010 1210   UROBILINOGEN 0.2 04/23/2010 1210   NITRITE NEGATIVE 04/23/2010 1210   LEUKOCYTESUR SMALL (A) 04/23/2010 1210      Radiographic Studies: Dg Chest Port 1 View  Result Date: 09/30/2016 CLINICAL DATA:  Dyspnea EXAM: PORTABLE CHEST 1 VIEW COMPARISON:  10/20/2009 FINDINGS: Cardiac shadow is mildly enlarged but accentuated by the portable technique. The lungs are clear bilaterally. No bony abnormality is seen. IMPRESSION: No active disease. Electronically Signed   By: Inez Catalina M.D.   On: 09/30/2016 14:36    EKG: Ordered and pending  Assessment/Plan:   Active Problems:   Gout   Essential hypertension   Chronic diastolic heart failure ([place])   GI bleed  GI BLEED  Patient is hemodynamically stable at present.  Keep patient nothing by mouth. Normal saline at 1 25 mL an hour for maintenance. Pantoprazole IV 40 every 12. Follow CBC every 6 hours. Transfuse if warranted. Patient seen by GI who are planning for upper and lower endoscopy tomorrow.  HTN Patient on multiple antihypertensives. I am holding her amlodipine, losartan and her Lasix. However I am going to continue her Toprol-XL 100 mg by mouth daily to avoid rebound hypertension as well as for cardioprotection given  decreasing H&H. Hydralazine IV when necessary if pressure rises. Would restart meds at earliest possible safe opportunity.  DIASTOLIC HEART FAILURE Patient appears to be compensated lungs are clear no oxygen requirement. I am holding Lasix and diltiazem due to GI bleed. Would restart as early as possible.  GOUT Holding allopurinol as patient is nothing by mouth. No  evidence of acute flare    Other information:   DVT prophylaxis: SCD. No Lovenox given ongoing GI bleed Code Status: Full code. Family Communication: Patient's daughter was at bedside throughout entire encounter Disposition Plan: home Consults called: GI Admission status: Inpatient  The medical decision making on this patient was of high complexity and the patient is at high risk for clinical deterioration, therefore this is a level 3 visit.   Dewaine Oats Derek Jack [facility] Pager [phone] Cell: [phone]   If 7PM-7AM, please contact night-coverage [email or web address] Password [email or web address] 09/30/2016, 5:21 PM

## 2016-10-01 ENCOUNTER — Other Ambulatory Visit: Payer: Self-pay | Admitting: Interventional Cardiology

## 2016-10-01 ENCOUNTER — Encounter (HOSPITAL_COMMUNITY)
Admission: EM | Disposition: A | Discharge status: Home / Self Care | Payer: Self-pay | Source: Home / Self Care | Attending: Emergency Medicine

## 2016-10-01 ENCOUNTER — Encounter (HOSPITAL_COMMUNITY): Payer: Self-pay | Admitting: *Deleted

## 2016-10-01 DIAGNOSIS — K5731 Diverticulosis of large intestine without perforation or abscess with bleeding: Secondary | ICD-10-CM | POA: Diagnosis not present

## 2016-10-01 DIAGNOSIS — K5730 Diverticulosis of large intestine without perforation or abscess without bleeding: Secondary | ICD-10-CM

## 2016-10-01 DIAGNOSIS — I5032 Chronic diastolic (congestive) heart failure: Secondary | ICD-10-CM | POA: Diagnosis not present

## 2016-10-01 DIAGNOSIS — K921 Melena: Secondary | ICD-10-CM

## 2016-10-01 HISTORY — PX: COLONOSCOPY: SHX5424

## 2016-10-01 LAB — CBC
HCT: 34.4 % — ABNORMAL LOW (ref 36.0–46.0)
HCT: 33.5 % — ABNORMAL LOW (ref 36.0–46.0)
HCT: 33.5 % — ABNORMAL LOW (ref 36.0–46.0)
Hemoglobin: 11.2 g/dL — ABNORMAL LOW (ref 12.0–15.0)
Hemoglobin: 10.8 g/dL — ABNORMAL LOW (ref 12.0–15.0)
Hemoglobin: 10.8 g/dL — ABNORMAL LOW (ref 12.0–15.0)
MCH: 28.2 pg (ref 26.0–34.0)
MCH: 28.1 pg (ref 26.0–34.0)
MCH: 28.3 pg (ref 26.0–34.0)
MCHC: 32.6 g/dL (ref 30.0–36.0)
MCHC: 32.2 g/dL (ref 30.0–36.0)
MCHC: 32.2 g/dL (ref 30.0–36.0)
MCV: 86.6 fL (ref 78.0–100.0)
MCV: 87.2 fL (ref 78.0–100.0)
MCV: 87.7 fL (ref 78.0–100.0)
Platelets: 217 10*3/uL (ref 150–400)
Platelets: 229 10*3/uL (ref 150–400)
Platelets: 228 10*3/uL (ref 150–400)
RBC: 3.97 MIL/uL (ref 3.87–5.11)
RBC: 3.84 MIL/uL — ABNORMAL LOW (ref 3.87–5.11)
RBC: 3.82 MIL/uL — ABNORMAL LOW (ref 3.87–5.11)
RDW: 15.2 % (ref 11.5–15.5)
RDW: 15.5 % (ref 11.5–15.5)
RDW: 15.4 % (ref 11.5–15.5)
WBC: 7.6 10*3/uL (ref 4.0–10.5)
WBC: 6.3 10*3/uL (ref 4.0–10.5)
WBC: 8 10*3/uL (ref 4.0–10.5)

## 2016-10-01 LAB — GLUCOSE, CAPILLARY
Glucose-Capillary: 100 mg/dL — ABNORMAL HIGH (ref 65–99)
Glucose-Capillary: 113 mg/dL — ABNORMAL HIGH (ref 65–99)
Glucose-Capillary: 83 mg/dL (ref 65–99)
Glucose-Capillary: 92 mg/dL (ref 65–99)

## 2016-10-01 SURGERY — COLONOSCOPY
Anesthesia: Moderate Sedation

## 2016-10-01 MED ORDER — DIPHENHYDRAMINE HCL 50 MG/ML IJ SOLN
INTRAMUSCULAR | Status: AC
  Filled 2016-10-01: qty 1

## 2016-10-01 MED ORDER — MIDAZOLAM HCL 5 MG/ML IJ SOLN
INTRAMUSCULAR | Status: AC
  Filled 2016-10-01: qty 2

## 2016-10-01 MED ORDER — FENTANYL CITRATE (PF) 100 MCG/2ML IJ SOLN
INTRAMUSCULAR | Status: AC
  Filled 2016-10-01: qty 2

## 2016-10-01 MED ORDER — FENTANYL CITRATE (PF) 100 MCG/2ML IJ SOLN
INTRAMUSCULAR | Status: DC | PRN
  Administered 2016-10-01 (×4): 25 ug via INTRAVENOUS

## 2016-10-01 MED ORDER — MIDAZOLAM HCL 5 MG/5ML IJ SOLN
INTRAMUSCULAR | Status: DC | PRN
  Administered 2016-10-01: 2 mg via INTRAVENOUS
  Administered 2016-10-01: 1 mg via INTRAVENOUS
  Administered 2016-10-01: 2 mg via INTRAVENOUS

## 2016-10-01 NOTE — Op Note (Signed)
[facility] Patient Name: Monica [name] Procedure Date: 10/01/2016 MRN: [id number] Attending MD: Gatha Mayer , MD Date of Birth: 05/04/1935 CSN: [id number] Age: 81 Admit Type: Inpatient Procedure:                Colonoscopy Indications:              Hematochezia Providers:                Gatha Mayer, MD, Elna Breslow, RN, Corliss Parish, Technician Referring MD:              Medicines:                Propofol per Anesthesia, Monitored Anesthesia Care,                            Fentanyl 100 micrograms IV, Midazolam 5 mg IV Complications:            No immediate complications. Estimated blood loss:                            None. Estimated Blood Loss:     Estimated blood loss: none. Procedure:                Pre-Anesthesia Assessment:                           - Prior to the procedure, a History and Physical                            was performed, and patient medications and                            allergies were reviewed. The patient's tolerance of                            previous anesthesia was also reviewed. The risks                            and benefits of the procedure and the sedation                            options and risks were discussed with the patient.                            All questions were answered, and informed consent                            was obtained. Prior Anticoagulants: The patient has                            taken no previous anticoagulant or antiplatelet                            agents. ASA Grade  Assessment: III - A patient with                            severe systemic disease. After reviewing the risks                            and benefits, the patient was deemed in                            satisfactory condition to undergo the procedure.                           After obtaining informed consent, the colonoscope                            was passed under direct vision.  Throughout the                            procedure, the patient's blood pressure, pulse, and                            oxygen saturations were monitored continuously. The                            EC-[id number] ([id number]) scope was introduced through                            the anus and advanced to the the cecum, identified                            by appendiceal orifice and ileocecal valve. The                            ileocecal valve, appendiceal orifice, and rectum                            were photographed. The quality of the bowel                            preparation was excellent. The bowel preparation                            used was Miralax. Scope In: 10:10:38 AM Scope Out: 10:25:14 AM Scope Withdrawal Time: 0 hours 6 minutes 23 seconds  Total Procedure Duration: 0 hours 14 minutes 36 seconds  Findings:      The perianal and digital rectal examinations were normal.      Many small and large-mouthed diverticula were found in the entire colon.       There was evidence of past bleeding from the diverticular opening.      The exam was otherwise without abnormality on direct and retroflexion       views. Impression:                Moderate Sedation:      Moderate (conscious) sedation was administered by the  endoscopy nurse       and supervised by the endoscopist. The following parameters were       monitored: oxygen saturation, heart rate, blood pressure, respiratory       rate, EKG, adequacy of pulmonary ventilation, and response to care.       Total physician intraservice time was 19 minutes. Recommendation:           - Resume previous diet.                           - Continue present medications.                           - No repeat colonoscopy due to age.                           - Since I saw clot in shape of fecalith and no                            bleeding, no melena and NL BUN/creat ratio did not                            do EGD                            Supportive care - if ok tomorrow home Procedure Code(s):        --- Professional ---                           [id number], Colonoscopy, flexible; diagnostic, including                            collection of specimen(s) by brushing or washing,                            when performed (separate procedure)                           G0500, Moderate sedation services provided by the                            same physician or other qualified health care                            professional performing a gastrointestinal                            endoscopic service that sedation supports,                            requiring the presence of an independent trained                            observer to assist in the monitoring of the  patient's level of consciousness and physiological                            status; initial 15 minutes of intra-service time;                            patient age 38 years or older (additional time may                            be reported with [phone], as appropriate) Diagnosis Code(s):        --- Professional ---                           K92.1, Melena (includes Hematochezia) CPT copyright 2016 [employer] Association. All rights reserved. The codes documented in this report are preliminary and upon coder review may  be revised to meet current compliance requirements. Gatha Mayer, MD 10/01/2016 10:47:40 AM This report has been signed electronically. Number of Addenda: 0

## 2016-10-01 NOTE — Interval H&P Note (Signed)
History and Physical Interval Note:  10/01/2016 9:58 AM  Monica [name]  has presented today for surgery, with the diagnosis of Hematochezia and abdominal pain  The various methods of treatment have been discussed with the patient and family. After consideration of risks, benefits and other options for treatment, the patient has consented to  Procedure(s): COLONOSCOPY (N/A) ESOPHAGOGASTRODUODENOSCOPY (EGD) (N/A) as a surgical intervention .  The patient's history has been reviewed, patient examined, no change in status, stable for surgery.  I have reviewed the patient's chart and labs.  Questions were answered to the patient's satisfaction.     Silvano Rusk

## 2016-10-01 NOTE — H&P (View-Only) (Signed)
Reason for Consult: Hematochezia and abdominal pain Referring Physician: [facility]  [facility] HPI: This is a 81 year old female with a PMH of diverticula, HTN, GERD, obesity, and cardiomegaly admitted to the hospital for GI bleeding.  Her symptoms started this past Monday after she ate at a restaurant.  She initially thought that the hematochezia was not truly bleeding, but the curry that she ate the day before.  Throughout the week she continued to have further and progressive bleeding.  Last evening was the worst in that she had an increase in frequency and volume.  She tried to remedy the situation with a laxative, but her symptoms did not improve.  As a result she presented to the ER and she was noted by the ER physician to have "cranberry" colored stool.  Her records state and she confirmed two colonoscopies in 2002 and 2006 with findings of diverticula.  She does not recall the indications for the procedures.  In addition, she reports some upper abdominal and chest type discomfort, which is associated with dysphagia.  These symptoms are not new, but she started to have a worsening of these symptoms along with her current bleeding.  Past Medical History:  Diagnosis Date  . Allergic rhinitis   . Anxiety   . Benign neoplasm of breast 2013  . Cardiomegaly   . Compulsive overeating   . Depression   . Diffuse cystic mastopathy 2013  . Edema   . Family history of malignant neoplasm of breast   . Gout   . HTN (hypertension)   . Hyperlipidemia   . Lump or mass in breast 2012  . OA (osteoarthritis) of knee    with injections  . Obesity   . Other abnormal glucose   . Personal history of tobacco use, presenting hazards to health   . Sciatica   . Sinus infection 2010  . Unspecified sleep apnea     Past Surgical History:  Procedure Laterality Date  . adenosine cardiolite  1/08   low risk   . APPENDECTOMY    . BREAST LUMPECTOMY     right x1 ('89) left x2 ('70s, '90)  .  CARDIAC CATHETERIZATION  2001   normal per pt  . COLONOSCOPY  11/02   diverticulosis  . COLONOSCOPY  9/06   diverticulosis, hemorhoids  . dexa  3/02   normal  . EYE SURGERY  2012   cataract  . knee replacement Right 9/11   R. Dr. Telford Nab  . sleep study  5/05  . TONSILLECTOMY    . TOTAL ABDOMINAL HYSTERECTOMY     fibroids, age of 5  . TOTAL KNEE ARTHROPLASTY Left 2012  . US TRANSVAGINAL PELVIC MODIFIED  2000, 2001    Family History  Problem Relation Age of Onset  . Stroke Father   . Hypertension Mother        (a lot of animosity in their relationship)  . Heart failure Mother   . Gout Unknown        whole family   . Prostate cancer Brother   . Hepatitis Brother        Hep C; alcoholism (terminal)  . Breast cancer Sister   . Other Brother        drug addiction    Social History:  reports that she has quit smoking. Her smoking use included Cigarettes. She has a 10.00 pack-year smoking history. She has never used smokeless tobacco. She reports that she drinks alcohol. She reports that she  does not use drugs.  Allergies:  Allergies  Allergen Reactions  . Buspirone Hcl     REACTION: made her sleepy, ? swollen legs  . Lisinopril     REACTION: dizziness  . Prozac [Fluoxetine Hcl]     sleepy    Medications:  Scheduled:  Continuous: . sodium chloride    . sodium chloride      Results for orders placed or performed during the hospital encounter of 09/30/16 (from the past 24 hour(s))  Comprehensive metabolic panel     Status: Abnormal   Collection Time: 09/30/16  2:24 PM  Result Value Ref Range   Sodium 143 135 - 145 mmol/L   Potassium 4.2 3.5 - 5.1 mmol/L   Chloride 109 101 - 111 mmol/L   CO2 28 22 - 32 mmol/L   Glucose, Bld 94 65 - 99 mg/dL   BUN 21 (H) 6 - 20 mg/dL   Creatinine, Ser 0.73 0.44 - 1.00 mg/dL   Calcium 9.2 8.9 - 10.3 mg/dL   Total Protein 6.9 6.5 - 8.1 g/dL   Albumin 4.0 3.5 - 5.0 g/dL   AST 20 15 - 41 U/L   ALT 16 14 - 54 U/L   Alkaline  Phosphatase 83 38 - 126 U/L   Total Bilirubin 0.4 0.3 - 1.2 mg/dL   GFR calc non Af Amer >60 >60 mL/min   GFR calc Af Amer >60 >60 mL/min   Anion gap 6 5 - 15  CBC WITH DIFFERENTIAL     Status: Abnormal   Collection Time: 09/30/16  2:24 PM  Result Value Ref Range   WBC 8.1 4.0 - 10.5 K/uL   RBC 3.94 3.87 - 5.11 MIL/uL   Hemoglobin 11.2 (L) 12.0 - 15.0 g/dL   HCT 34.3 (L) 36.0 - 46.0 %   MCV 87.1 78.0 - 100.0 fL   MCH 28.4 26.0 - 34.0 pg   MCHC 32.7 30.0 - 36.0 g/dL   RDW 15.2 11.5 - 15.5 %   Platelets 231 150 - 400 K/uL   Neutrophils Relative % 60 %   Neutro Abs 4.9 1.7 - 7.7 K/uL   Lymphocytes Relative 30 %   Lymphs Abs 2.4 0.7 - 4.0 K/uL   Monocytes Relative 7 %   Monocytes Absolute 0.6 0.1 - 1.0 K/uL   Eosinophils Relative 2 %   Eosinophils Absolute 0.2 0.0 - 0.7 K/uL   Basophils Relative 1 %   Basophils Absolute 0.1 0.0 - 0.1 K/uL  Lipase, blood     Status: None   Collection Time: 09/30/16  2:24 PM  Result Value Ref Range   Lipase 29 11 - 51 U/L  Protime-INR     Status: None   Collection Time: 09/30/16  2:24 PM  Result Value Ref Range   Prothrombin Time 12.2 11.4 - 15.2 seconds   INR 0.90   POC occult blood, ED Provider will collect     Status: Abnormal   Collection Time: 09/30/16  2:34 PM  Result Value Ref Range   Fecal Occult Bld POSITIVE (A) NEGATIVE  I-Stat CG4 Lactic Acid, ED  (not at [facility])     Status: None   Collection Time: 09/30/16  2:36 PM  Result Value Ref Range   Lactic Acid, Venous 0.80 0.5 - 1.9 mmol/L     Dg Chest Port 1 View  Result Date: 09/30/2016 CLINICAL DATA:  Dyspnea EXAM: PORTABLE CHEST 1 VIEW COMPARISON:  10/20/2009 FINDINGS: Cardiac shadow is mildly enlarged but accentuated by the  portable technique. The lungs are clear bilaterally. No bony abnormality is seen. IMPRESSION: No active disease. Electronically Signed   By: Inez Catalina M.D.   On: 09/30/2016 14:36    ROS:  As stated above in the HPI otherwise negative.  Blood pressure (!)  154/84, pulse 82, temperature 98 F (36.7 C), temperature source Oral, resp. rate 18, height 5\' 7"  (1.702 m), weight 116.1 kg (256 lb), SpO2 100 %.    PE: Gen: NAD, Alert and Oriented HEENT:  [place]/AT, EOMI Neck: Supple, no LAD Lungs: CTA Bilaterally CV: RRR without M/G/R ABM: Soft, obese, NTND, +BS Ext: No C/C/E  Assessment/Plan: 1) Hematochezia. 2) GERD. 3) Diverticula. 4) Anemia.   I believe the patient has a diverticular bleed, but with her upper symptoms an upper GI source needs to be considered.  It has been some time since her last colonoscopy and she will benefit with an EGD/Colonoscopy tomorrow.  Plan: 1) EGD/Colonoscopy tomorrow. 2) Follow HGB and transfuse as necessary.   Klarisa Barman D 09/30/2016, 4:46 PM

## 2016-10-01 NOTE — Progress Notes (Signed)
PROGRESS NOTE    Monica [name]  XAJ:[id number] DOB: [birth year] DOA: 09/30/2016 PCP: [name], DO  Brief Narrative: This is a 81 year old female with a PMH of diverticula, HTN, GERD, obesity, and cardiomegaly admitted to the hospital for GI bleeding.   history of black stools for 2 days as well as some episodes with red clots , hemoglobin has been relatively stable. Her records state and she confirmed two colonoscopies in 2002 and 2006 with findings of diverticula.    Assessment & Plan:    Acute GI bleed/melena, suspected upper -History of intermittent NSAID use -Continue IV Protonix -GI consult appreciated plan for EGD and colonoscopy today -Hemoglobin is stable will change CBC to every 12 -Stop IV fluids  Hypertension -Continue Toprol -We'll hold amlodipine losartan and Lasix  Chronic diastolic CHF -Lasix on hold at this time would resume tomorrow if stable  Gout -Stable resume allopurinol at discharge   DVT prophylaxis: SCDs Code Status: Full code Family Communication: No family at bedside Disposition Plan: Home pending GI workup and stability  Consultants:   [facility]   Subjective: -Feels okay multiple stools after colonoscopy prep, clear stools now  Objective: Vitals:   10/01/16 1020 10/01/16 1025 10/01/16 1030 10/01/16 1035  BP: (!) 196/68 (!) 162/53 (!) 126/45 (!) 125/52  Pulse: 64 (!) 59 61 61  Resp: (!) 26 (!) 28 19 18   Temp:   98 F (36.7 C)   TempSrc:   Oral   SpO2: 98% 98% 97% 96%  Weight:      Height:        Intake/Output Summary (Last 24 hours) at 10/01/16 1106 Last data filed at 09/30/16 1627  Gross per 24 hour  Intake              100 ml  Output                0 ml  Net              100 ml   Filed Weights   09/30/16 1330 09/30/16 1809  Weight: 116.1 kg (256 lb) 114.9 kg (253 lb 6.4 oz)    Examination:  General exam: Appears calm and comfortable, Obese, no distress Respiratory system: Clear to auscultation. Respiratory effort  normal. Cardiovascular system: S1 & S2 heard, RRR. No JVD,  Gastrointestinal system: Abdomen is nondistended, soft and nontender. Normal bowel sounds heard. Central nervous system: Alert and oriented. No focal neurological deficits. Extremities: Symmetric 5 x 5 power. Skin: No rashes, lesions or ulcers Psychiatry: Judgement and insight appear normal. Mood & affect appropriate.     Data Reviewed:   CBC:  Recent Labs Lab 09/30/16 1424 09/30/16 1901 10/01/16 0047 10/01/16 0931  WBC 8.1 8.3 7.6 6.3  NEUTROABS 4.9  --   --   --   HGB 11.2* 11.2* 11.2* 10.8*  HCT 34.3* 34.5* 34.4* 33.5*  MCV 87.1 86.5 86.6 87.2  PLT 231 229 217 094   Basic Metabolic Panel:  Recent Labs Lab 09/30/16 1424  NA 143  K 4.2  CL 109  CO2 28  GLUCOSE 94  BUN 21*  CREATININE 0.73  CALCIUM 9.2   GFR: Estimated Creatinine Clearance: 74.6 mL/min (by C-G formula based on SCr of 0.73 mg/dL). Liver Function Tests:  Recent Labs Lab 09/30/16 1424  AST 20  ALT 16  ALKPHOS 83  BILITOT 0.4  PROT 6.9  ALBUMIN 4.0    Recent Labs Lab 09/30/16 1424  LIPASE 29  No results for input(s): AMMONIA in the last 168 hours. Coagulation Profile:  Recent Labs Lab 09/30/16 1424  INR 0.90   Cardiac Enzymes: No results for input(s): CKTOTAL, CKMB, CKMBINDEX, TROPONINI in the last 168 hours. BNP (last 3 results) No results for input(s): PROBNP in the last 8760 hours. HbA1C: No results for input(s): HGBA1C in the last 72 hours. CBG:  Recent Labs Lab 09/30/16 1839 09/30/16 2346 10/01/16 0553  GLUCAP 79 91 83   Lipid Profile: No results for input(s): CHOL, HDL, LDLCALC, TRIG, CHOLHDL, LDLDIRECT in the last 72 hours. Thyroid Function Tests: No results for input(s): TSH, T4TOTAL, FREET4, T3FREE, THYROIDAB in the last 72 hours. Anemia Panel: No results for input(s): VITAMINB12, FOLATE, FERRITIN, TIBC, IRON, RETICCTPCT in the last 72 hours. Urine analysis:    Component Value Date/Time    COLORURINE AMBER BIOCHEMICALS MAY BE AFFECTED BY COLOR (A) 04/23/2010 1210   APPEARANCEUR CLOUDY (A) 04/23/2010 1210   LABSPEC 1.027 04/23/2010 1210   PHURINE 6.5 04/23/2010 1210   GLUCOSEU NEGATIVE 04/23/2010 1210   HGBUR NEGATIVE 04/23/2010 1210   BILIRUBINUR NEGATIVE 04/23/2010 1210   KETONESUR TRACE (A) 04/23/2010 1210   PROTEINUR NEGATIVE 04/23/2010 1210   UROBILINOGEN 0.2 04/23/2010 1210   NITRITE NEGATIVE 04/23/2010 1210   LEUKOCYTESUR SMALL (A) 04/23/2010 1210   Sepsis Labs: @LABRCNTIP (procalcitonin:4,lacticidven:4)  )No results found for this or any previous visit (from the past 240 hour(s)).       Radiology Studies: Dg Chest Port 1 View  Result Date: 09/30/2016 CLINICAL DATA:  Dyspnea EXAM: PORTABLE CHEST 1 VIEW COMPARISON:  10/20/2009 FINDINGS: Cardiac shadow is mildly enlarged but accentuated by the portable technique. The lungs are clear bilaterally. No bony abnormality is seen. IMPRESSION: No active disease. Electronically Signed   By: Inez Catalina M.D.   On: 09/30/2016 14:36        Scheduled Meds: . metoprolol succinate  100 mg Oral Daily  . pantoprazole (PROTONIX) IV  40 mg Intravenous Q12H   Continuous Infusions:   LOS: 1 day    Time spent: 21min    Domenic Polite, MD [facility] Pager [phone]  If 7PM-7AM, please contact night-coverage [email or web address] Password [email or web address] 10/01/2016, 11:06 AM

## 2016-10-01 NOTE — Care Management CC44 (Signed)
Condition Code 44 Documentation Completed  Patient Details  Name: Kiyoko Mcguirt MRN: [id number] Date of Birth: 1935-05-23   Condition Code 44 given:  Yes Patient signature on Condition Code 44 notice:  Yes Documentation of 2 MD's agreement:  Yes Code 44 added to claim:  Yes    Erenest Rasher, RN 10/01/2016, 5:33 PM

## 2016-10-01 NOTE — Care Management Obs Status (Signed)
[place] NOTIFICATION   Patient Details  Name: Monica [name] MRN: [id number] Date of Birth: [birth year]   Medicare Observation Status Notification Given:  Yes    Erenest Rasher, RN 10/01/2016, 5:33 PM

## 2016-10-02 ENCOUNTER — Observation Stay (HOSPITAL_BASED_OUTPATIENT_CLINIC_OR_DEPARTMENT_OTHER)
Admission: EM | Admit: 2016-10-02 | Discharge: 2016-10-05 | Disposition: A | Discharge status: Home / Self Care | Payer: Medicare Other | Source: Home / Self Care | Attending: Emergency Medicine | Admitting: Emergency Medicine

## 2016-10-02 ENCOUNTER — Encounter (HOSPITAL_COMMUNITY): Payer: Self-pay | Admitting: Internal Medicine

## 2016-10-02 DIAGNOSIS — K5731 Diverticulosis of large intestine without perforation or abscess with bleeding: Secondary | ICD-10-CM | POA: Diagnosis present

## 2016-10-02 DIAGNOSIS — I10 Essential (primary) hypertension: Secondary | ICD-10-CM | POA: Diagnosis present

## 2016-10-02 DIAGNOSIS — F418 Other specified anxiety disorders: Secondary | ICD-10-CM | POA: Diagnosis present

## 2016-10-02 DIAGNOSIS — K922 Gastrointestinal hemorrhage, unspecified: Secondary | ICD-10-CM | POA: Diagnosis present

## 2016-10-02 DIAGNOSIS — E785 Hyperlipidemia, unspecified: Secondary | ICD-10-CM | POA: Diagnosis present

## 2016-10-02 DIAGNOSIS — K921 Melena: Secondary | ICD-10-CM | POA: Diagnosis present

## 2016-10-02 DIAGNOSIS — M109 Gout, unspecified: Secondary | ICD-10-CM | POA: Diagnosis present

## 2016-10-02 DIAGNOSIS — I5032 Chronic diastolic (congestive) heart failure: Secondary | ICD-10-CM | POA: Diagnosis not present

## 2016-10-02 DIAGNOSIS — K5730 Diverticulosis of large intestine without perforation or abscess without bleeding: Secondary | ICD-10-CM | POA: Diagnosis present

## 2016-10-02 DIAGNOSIS — G4733 Obstructive sleep apnea (adult) (pediatric): Secondary | ICD-10-CM | POA: Diagnosis present

## 2016-10-02 LAB — COMPREHENSIVE METABOLIC PANEL
ALT: 17 U/L (ref 14–54)
AST: 25 U/L (ref 15–41)
Albumin: 3.6 g/dL (ref 3.5–5.0)
Alkaline Phosphatase: 68 U/L (ref 38–126)
Anion gap: 7 (ref 5–15)
BUN: 16 mg/dL (ref 6–20)
CO2: 25 mmol/L (ref 22–32)
Calcium: 8.8 mg/dL — ABNORMAL LOW (ref 8.9–10.3)
Chloride: 110 mmol/L (ref 101–111)
Creatinine, Ser: 0.88 mg/dL (ref 0.44–1.00)
GFR calc Af Amer: >60 mL/min (ref >60)
GFR calc non Af Amer: >60 mL/min (ref >60)
Glucose, Bld: 123 mg/dL — ABNORMAL HIGH (ref 65–99)
Potassium: 3.7 mmol/L (ref 3.5–5.1)
Sodium: 142 mmol/L (ref 135–145)
Total Bilirubin: 0.5 mg/dL (ref 0.3–1.2)
Total Protein: 6.5 g/dL (ref 6.5–8.1)

## 2016-10-02 LAB — CBC
HCT: 33.8 % — ABNORMAL LOW (ref 36.0–46.0)
HCT: 32.1 % — ABNORMAL LOW (ref 36.0–46.0)
Hemoglobin: 11 g/dL — ABNORMAL LOW (ref 12.0–15.0)
Hemoglobin: 10.3 g/dL — ABNORMAL LOW (ref 12.0–15.0)
MCH: 28.3 pg (ref 26.0–34.0)
MCH: 27.5 pg (ref 26.0–34.0)
MCHC: 32.5 g/dL (ref 30.0–36.0)
MCHC: 32.1 g/dL (ref 30.0–36.0)
MCV: 86.9 fL (ref 78.0–100.0)
MCV: 85.8 fL (ref 78.0–100.0)
Platelets: 233 10*3/uL (ref 150–400)
Platelets: 195 10*3/uL (ref 150–400)
RBC: 3.89 MIL/uL (ref 3.87–5.11)
RBC: 3.74 MIL/uL — ABNORMAL LOW (ref 3.87–5.11)
RDW: 15.4 % (ref 11.5–15.5)
RDW: 15.2 % (ref 11.5–15.5)
WBC: 7 10*3/uL (ref 4.0–10.5)
WBC: 7.7 10*3/uL (ref 4.0–10.5)

## 2016-10-02 LAB — BASIC METABOLIC PANEL
Anion gap: 9 (ref 5–15)
BUN: 12 mg/dL (ref 6–20)
CO2: 26 mmol/L (ref 22–32)
Calcium: 9.3 mg/dL (ref 8.9–10.3)
Chloride: 108 mmol/L (ref 101–111)
Creatinine, Ser: 0.71 mg/dL (ref 0.44–1.00)
GFR calc Af Amer: >60 mL/min (ref >60)
GFR calc non Af Amer: >60 mL/min (ref >60)
Glucose, Bld: 107 mg/dL — ABNORMAL HIGH (ref 65–99)
Potassium: 3.6 mmol/L (ref 3.5–5.1)
Sodium: 143 mmol/L (ref 135–145)

## 2016-10-02 LAB — POC OCCULT BLOOD, ED: Fecal Occult Bld: POSITIVE — AB

## 2016-10-02 LAB — TYPE AND SCREEN
ABO/RH(D): O POS
Antibody Screen: NEGATIVE

## 2016-10-02 LAB — GLUCOSE, CAPILLARY: Glucose-Capillary: 101 mg/dL — ABNORMAL HIGH (ref 65–99)

## 2016-10-02 MED ORDER — AMLODIPINE BESYLATE 5 MG PO TABS
5.0000 mg | ORAL_TABLET | Freq: Every day | ORAL | Status: DC
  Administered 2016-10-02: 5 mg via ORAL
  Filled 2016-10-02: qty 1

## 2016-10-02 MED ORDER — IRON 325 (65 FE) MG PO TABS: 1.0000 | ORAL_TABLET | Freq: Every day | ORAL | 0 refills | Status: DC

## 2016-10-02 NOTE — Progress Notes (Signed)
Patient ID: Monica [name], female   DOB: [birth year], 81 y.o.   MRN: [id number]    Progress Note   Subjective   Feels good, no stools or bleeding since procedure hgb pending this am -last check stable at 10.8   Objective   Vital signs in last 24 hours: Temp:  [97.9 F (36.6 C)-98.5 F (36.9 C)] 98.3 F (36.8 C) (08/12 0537) Pulse Rate:  [56-78] 73 (08/12 0537) Resp:  [16-28] 18 (08/12 0537) BP: (121-196)/(45-98) 183/86 (08/12 0537) SpO2:  [96 %-100 %] 99 % (08/12 0537) Last BM Date: 10/01/16 General: elderly AA female in NAD Heart:  Regular rate and rhythm; no murmurs Lungs: Respirations even and unlabored, lungs CTA bilaterally Abdomen:  Soft, nontender and nondistended. Normal bowel sounds. Extremities:  Without edema. Neurologic:  Alert and oriented,  grossly normal neurologically. Psych:  Cooperative. Normal mood and affect.  CBC Latest Ref Rng & Units 10/02/2016 10/01/2016 10/01/2016  WBC 4.0 - 10.5 K/uL 7.0 8.0 6.3  Hemoglobin 12.0 - 15.0 g/dL 11.0(L) 10.8(L) 10.8(L)  Hematocrit 36.0 - 46.0 % 33.8(L) 33.5(L) 33.5(L)  Platelets 150 - 400 K/uL 233 228 229     Assessment / Plan:    #1 81 yo female with acute GI bleed secondary to diverticular hemorrhage- no active bleeding past 24 hours. #2 mild anemia secondary to above #3 CHF  Plan; [place] for discharge today if this am hgb stable  No NSAIDS Advance to regular diet   LOS: 1 day   Amy Esterwood  10/02/2016, 8:38 AM   Agree with Ms. Genia Harold assessment and plan. Gatha Mayer, MD, [facility]

## 2016-10-02 NOTE — ED Provider Notes (Signed)
[facility] DEPT Provider Note   CSN: [id number] Arrival date & time: 10/02/16  2128     History   Chief Complaint Chief Complaint  Patient presents with  . Rectal Bleeding    HPI Monica [name] is a 81 y.o. female.  81 year old female who was discharged from the hospital this morning for diverticular bleed after several days of being stable with no further bleeding.  She was discharged home.  Colonoscopy revealed that she has diffuse diverticular disease.  There was one clot noted but appear to be stable. Since arrival home.  She has had 6 to 7 bright red blood stools and she's been unable to control.  Denies any discomfort but does state that she is incredibly dizzy when she is sitting or standing Denies any shortness of breath or chest pain      Past Medical History:  Diagnosis Date  . Allergic rhinitis   . Anxiety   . Benign neoplasm of breast 2013  . Cardiomegaly   . Compulsive overeating   . Depression   . Diffuse cystic mastopathy 2013  . Edema   . Gout   . HTN (hypertension)   . Hyperlipidemia   . Lump or mass in breast 2012  . OA (osteoarthritis) of knee    with injections  . Obesity   . Other abnormal glucose   . Personal history of tobacco use, presenting hazards to health   . Sciatica   . Sinus infection 2010  . Unspecified sleep apnea     Patient Active Problem List   Diagnosis Date Noted  . Hematochezia   . Diverticulosis of colon with hemorrhage   . GI bleed 09/30/2016  . Class 3 obesity with body mass index (BMI) of 40.0 to 44.9 in adult ([place]) 05/04/2016  . Status post bilateral knee replacements 05/04/2016  . Chronic cough 04/15/2016  . Seborrheic keratoses 04/15/2016  . Cervical disc disorder with radiculopathy of cervical region 12/01/2015  . Partial nontraumatic rupture of right rotator cuff 09/22/2015  . Shoulder joint pain 09/11/2015  . Compulsive overeating 01/01/2015  . Chronic diastolic heart failure (HCC) 12/12/2012   Class: Chronic  . Urge incontinence of urine 09/09/2011  . Knee osteoarthritis 02/09/2010  . Gout 03/20/2008  . Hyperlipidemia 12/21/2006  . Morbid obesity ([place]) 12/21/2006  . Depression with anxiety 12/21/2006  . Essential hypertension 12/21/2006  . Obstructive sleep apnea 12/21/2006    Past Surgical History:  Procedure Laterality Date  . adenosine cardiolite  1/08   low risk   . APPENDECTOMY    . BREAST LUMPECTOMY     right x1 ('89) left x2 ('70s, '90)  . CARDIAC CATHETERIZATION  2001   normal per pt  . COLONOSCOPY  11/02   diverticulosis  . COLONOSCOPY  9/06   diverticulosis, hemorhoids  . COLONOSCOPY N/A 10/01/2016   Procedure: COLONOSCOPY;  Surgeon: Gatha Mayer, MD;  Location: [facility];  Service: Endoscopy;  Laterality: N/A;  . dexa  3/02   normal  . EYE SURGERY  2012   cataract  . knee replacement Right 9/11   R. Dr. Telford Nab  . sleep study  5/05  . TONSILLECTOMY    . TOTAL ABDOMINAL HYSTERECTOMY     fibroids, age of 9  . TOTAL KNEE ARTHROPLASTY Left 2012  . US TRANSVAGINAL PELVIC MODIFIED  2000, 2001    OB History    Gravida Para Term Preterm AB Living   2       1 1  SAB TAB Ectopic Multiple Live Births   1              Obstetric Comments   Age with first menstruation-49 Age with first pregnancy-22 LMP-age 89, hysterectomy       Home Medications    Prior to Admission medications   Medication Sig Start Date End Date Taking? Authorizing Provider  ACCU-CHEK AVIVA PLUS test strip CHECK GLUCOSE WITH [facility] MEAL AND AT BEDTIME AS DIRECTED 11/28/14  Yes Tower, Wynelle Fanny, MD  ACCU-CHEK SOFTCLIX LANCETS lancets Check blood sugar once daily and as needed. Dx R73.01 02/03/15  Yes Tower, Wynelle Fanny, MD  allopurinol (ZYLOPRIM) 100 MG tablet Take 2 tablets (200 mg total) by mouth daily. 09/28/16  Yes Hulan Saas M, DO  amLODipine (NORVASC) 5 MG tablet TAKE 1 TABLET BY MOUTH DAILY 08/08/16  Yes Belva Crome, MD  calcium carbonate (OS-CAL) 600 MG TABS Take 600  mg by mouth daily.    Yes [provider]  Cholecalciferol (VITAMIN D3) 2000 units capsule Take 2,000 Units by mouth daily.   Yes [provider]  furosemide (LASIX) 40 MG tablet TAKE 1 TABLET BY MOUTH DAILY 08/08/16  Yes Cook, Jayce G, DO  MAGNESIUM CARBONATE PO Take 250 mg by mouth daily.   Yes [provider]  metoprolol succinate (TOPROL-XL) 100 MG 24 hr tablet TAKE 1 TABLET BY MOUTH DAILY WITH OR IMMEDIATLY FOLLOWING A MEAL 06/06/16  Yes Cook, Jayce G, DO  Omega-3 Fatty Acids (FISH OIL PO) Take 500 capsules by mouth daily.    Yes [provider]  potassium chloride SA (K-DUR,KLOR-CON) 20 MEQ tablet Take 1 tablet (20 mEq total) by mouth daily. 02/26/16  Yes Cook, Jayce G, DO  Ferrous Sulfate (IRON) 325 (65 Fe) MG TABS Take 1 tablet (325 mg total) by mouth daily. 10/10/16   Domenic Polite, MD    Family History Family History  Problem Relation Age of Onset  . Stroke Father   . Hypertension Mother        (a lot of animosity in their relationship)  . Heart failure Mother   . Gout Unknown        whole family   . Prostate cancer Brother   . Hepatitis Brother        Hep C; alcoholism (terminal)  . Breast cancer Sister   . Other Brother        drug addiction    Social History Social History  Substance Use Topics  . Smoking status: Former Smoker    Packs/day: 0.50    Years: 20.00    Types: Cigarettes  . Smokeless tobacco: Never Used     Comment: quit approx. 20 years ago  . Alcohol use 0.0 oz/week     Comment: wine occasional     Allergies   Buspirone hcl; Lisinopril; and Prozac [fluoxetine hcl]   Review of Systems Review of Systems  Constitutional: Negative for fever.  Respiratory: Negative for shortness of breath.   Cardiovascular: Negative for chest pain.  Gastrointestinal: Positive for blood in stool. Negative for abdominal pain.  Neurological: Positive for dizziness.  All other systems reviewed and are negative.    Physical  Exam Updated Vital Signs BP (!) 151/80 (BP Location: Right Arm)   Pulse 70   Temp 98 F (36.7 C) (Oral)   Resp 20   Ht 5\' 7"  (1.702 m)   Wt 114.8 kg (253 lb)   SpO2 97%   BMI 39.63 kg/m   Physical  Exam  Constitutional: She appears well-developed and well-nourished.  HENT:  Head: Normocephalic.  Eyes: Pupils are equal, round, and reactive to light.  Neck: Normal range of motion.  Cardiovascular: Normal rate.   Pulmonary/Chest: Effort normal.  Abdominal: Soft. Bowel sounds are normal. She exhibits no distension. There is no tenderness.  Genitourinary: Rectal exam shows guaiac positive stool.  Musculoskeletal: Normal range of motion.  Neurological: She is alert.  Skin: Skin is warm and dry. There is pallor.  Psychiatric: She has a normal mood and affect.  Nursing note and vitals reviewed.    ED Treatments / Results  Labs (all labs ordered are listed, but only abnormal results are displayed) Labs Reviewed  COMPREHENSIVE METABOLIC PANEL - Abnormal; Notable for the following:       Result Value   Glucose, Bld 123 (*)    Calcium 8.8 (*)    All other components within normal limits  CBC - Abnormal; Notable for the following:    RBC 3.74 (*)    Hemoglobin 10.3 (*)    HCT 32.1 (*)    All other components within normal limits  POC OCCULT BLOOD, ED - Abnormal; Notable for the following:    Fecal Occult Bld POSITIVE (*)    All other components within normal limits  TYPE AND SCREEN    EKG  EKG Interpretation None       Radiology No results found.  Procedures Procedures (including critical care time)  Medications Ordered in ED Medications - No data to display   Initial Impression / Assessment and Plan / ED Course  I have reviewed the triage vital signs and the nursing notes.  Pertinent labs & imaging results that were available during my care of the patient were reviewed by me and considered in my medical decision making (see chart for details).    Patient  is guaiac positive  Review of labs show that she has lost approximately 1 g of hemoglobin in the last 12 hours.  She is symptomatic, but did not need of transfusion at this time.  She will be paid for observation    Final Clinical Impressions(s) / ED Diagnoses   Final diagnoses:  Lower GI bleed    New Prescriptions New Prescriptions   No medications on file     Junius Creamer, NP 10/02/16 2220    Junius Creamer, NP 10/03/16 [place] [place]kerel    Nat Christen, MD 10/04/16 [phone]

## 2016-10-02 NOTE — Progress Notes (Signed)
Discharge instructions explained to pt. IV dc'd. Daughter in to pick pt up. Jolayne discharged via wheelchair

## 2016-10-02 NOTE — ED Triage Notes (Signed)
Pt from home via EMS.  Pt d/c from here this am for rectal bleeding from diverticulitis.  Pt reports bright red blood 'pouring out' when she sat on toilet.  Given 500 ml NS in 20 G RH by EMS.  SBP about 150 when d/c today then about 100 with EMS.  Reports dizziness/weakness when sitting upright.  Denies any falls or recent injury. VS: 143/79, 70 bpm, 138 CBG, 16 RR

## 2016-10-02 NOTE — Discharge Summary (Signed)
Physician Discharge Summary  Monica [name] OZH:[id number] DOB: February 25, 1935 DOA: 09/30/2016  PCP: [name], DO  Admit date: 09/30/2016 Discharge date: 10/02/2016  Time spent: 35 minutes  Recommendations for Outpatient Follow-up:  1. PCP Dr.Cook in 1 week 2. CBC in 1-2weeks   Discharge Diagnoses:    Diverticulosis of colon with hemorrhage   Gout   Essential hypertension   Chronic diastolic heart failure ([place])   GI bleed   Hematochezia   Discharge Condition: stable  Diet recommendation: heart healthy  Filed Weights   09/30/16 1330 09/30/16 1809  Weight: 116.1 kg (256 lb) 114.9 kg (253 lb 6.4 oz)    History of present illness:  This is a 81 year old female with a PMH of diverticula, HTN, GERD, obesity, and cardiomegaly admitted to the hospital for GI bleeding.  history of dark red stools for 2 days as well as some episodes with red clots , hemoglobin has been relatively stable.Her records state and she confirmed two colonoscopies in 2002 and 2006 with findings of diverticula.    Hospital Course:   Diverticular bleed -resolved -GI consulted, underwent colonoscopy yesterday which noted some old diverticular bleed and a clot in the shape of a fecolith, Hb stable in 11 range -no further bleeding, per GI Bleeding felt to be lower Gi and diverticular and hence EGD not needed at this time, she remained stable and was discharge home in a stable condition this morning  Hypertension -Continue Toprol -resumed lasix and amlodipine, stopped losartan due to BP trend  Chronic diastolic CHF -stable, lasix resumed at discharge  Gout -Stable resume allopurinol at discharge  Procedures:  Colonoscopy by [facility] 8/11      The perianal and digital rectal examinations were normal.      Many small and large-mouthed diverticula were found in the entire colon.       There was evidence of past bleeding from the diverticular opening.      The exam was otherwise without  abnormality on direct and retroflexion       views. Recommendation:           - Resume previous diet.                           - Continue present medications.                           - No repeat colonoscopy due to age.                           - Since I saw clot in shape of fecalith and no                            bleeding, no melena and NL BUN/creat ratio did not                            do EGD                           Supportive care - if ok tomorrow home   Consultations:  [facility]  Discharge Exam: Vitals:   10/02/16 0942 10/02/16 0945  BP: (!) 158/78 (!) 158/78  Pulse: 72   Resp:    Temp:  SpO2:      General: AAOx3 Cardiovascular: S1S2/RRR Respiratory: CTAB  Discharge Instructions   Discharge Instructions    Diet - low sodium heart healthy    Complete by:  As directed    Increase activity slowly    Complete by:  As directed      Discharge Medication List as of 10/02/2016 10:48 AM    CONTINUE these medications which have CHANGED   Details  Ferrous Sulfate (IRON) 325 (65 Fe) MG TABS Take 1 tablet (325 mg total) by mouth daily., Starting Mon 10/10/2016, Normal      CONTINUE these medications which have NOT CHANGED   Details  ACCU-CHEK AVIVA PLUS test strip CHECK GLUCOSE WITH EACH MEAL AND AT BEDTIME AS DIRECTED, Normal    ACCU-CHEK SOFTCLIX LANCETS lancets Check blood sugar once daily and as needed. Dx R73.01, Normal    allopurinol (ZYLOPRIM) 100 MG tablet Take 2 tablets (200 mg total) by mouth daily., Starting Wed 09/28/2016, Normal    amLODipine (NORVASC) 5 MG tablet TAKE 1 TABLET BY MOUTH DAILY, Normal    calcium carbonate (OS-CAL) 600 MG TABS Take 600 mg by mouth daily. , Historical Med    Cholecalciferol (VITAMIN D3) 2000 units capsule Take 2,000 Units by mouth daily., Historical Med    furosemide (LASIX) 40 MG tablet TAKE 1 TABLET BY MOUTH DAILY, Normal    MAGNESIUM CARBONATE PO Take 250 mg by mouth daily., Historical Med    metoprolol  succinate (TOPROL-XL) 100 MG 24 hr tablet TAKE 1 TABLET BY MOUTH DAILY WITH OR IMMEDIATLY FOLLOWING A MEAL, Normal    Omega-3 Fatty Acids (FISH OIL PO) Take 500 capsules by mouth daily. , Historical Med    potassium chloride SA (K-DUR,KLOR-CON) 20 MEQ tablet Take 1 tablet (20 mEq total) by mouth daily., Starting Fri 02/26/2016, Normal      STOP taking these medications     gabapentin (NEURONTIN) 100 MG capsule      losartan (COZAAR) 50 MG tablet        Allergies  Allergen Reactions  . Buspirone Hcl     REACTION: made her sleepy, ? swollen legs  . Lisinopril     REACTION: dizziness  . Prozac [Fluoxetine Hcl]     sleepy   Follow-up Information    [name], DO. Schedule an appointment as soon as possible for a visit in 1 week(s).   Specialty:  Family Medicine Contact information: [address] [place] [address] [place] [address] [phone]            The results of significant diagnostics from this hospitalization (including imaging, microbiology, ancillary and laboratory) are listed below for reference.    Significant Diagnostic Studies: Dg Chest Port 1 View  Result Date: 09/30/2016 CLINICAL DATA:  Dyspnea EXAM: PORTABLE CHEST 1 VIEW COMPARISON:  10/20/2009 FINDINGS: Cardiac shadow is mildly enlarged but accentuated by the portable technique. The lungs are clear bilaterally. No bony abnormality is seen. IMPRESSION: No active disease. Electronically Signed   By: Inez Catalina M.D.   On: 09/30/2016 14:36   Dg Foot 2 Views Right  Result Date: 09/15/2016 Please see detailed radiograph report in office note.   Microbiology: No results found for this or any previous visit (from the past 240 hour(s)).   Labs: Basic Metabolic Panel:  Recent Labs Lab 09/30/16 1424 10/02/16 0405  NA 143 143  K 4.2 3.6  CL 109 108  CO2 28 26  GLUCOSE 94 107*  BUN 21* 12  CREATININE  0.73 0.71  CALCIUM 9.2 9.3   Liver Function Tests:  Recent Labs Lab 09/30/16 1424  AST 20   ALT 16  ALKPHOS 83  BILITOT 0.4  PROT 6.9  ALBUMIN 4.0    Recent Labs Lab 09/30/16 1424  LIPASE 29   No results for input(s): AMMONIA in the last 168 hours. CBC:  Recent Labs Lab 09/30/16 1424 09/30/16 1901 10/01/16 0047 10/01/16 0931 10/01/16 2133 10/02/16 0858  WBC 8.1 8.3 7.6 6.3 8.0 7.0  NEUTROABS 4.9  --   --   --   --   --   HGB 11.2* 11.2* 11.2* 10.8* 10.8* 11.0*  HCT 34.3* 34.5* 34.4* 33.5* 33.5* 33.8*  MCV 87.1 86.5 86.6 87.2 87.7 86.9  PLT 231 229 217 229 228 233   Cardiac Enzymes: No results for input(s): CKTOTAL, CKMB, CKMBINDEX, TROPONINI in the last 168 hours. BNP: BNP (last 3 results) No results for input(s): BNP in the last 8760 hours.  ProBNP (last 3 results) No results for input(s): PROBNP in the last 8760 hours.  CBG:  Recent Labs Lab 10/01/16 0553 10/01/16 1205 10/01/16 1712 10/02/16 0003 10/02/16 0540  GLUCAP 83 92 113* 100* 101*       Signed:  Christophe Rising MD.  [facility] 10/02/2016, 1:03 PM

## 2016-10-03 DIAGNOSIS — K922 Gastrointestinal hemorrhage, unspecified: Secondary | ICD-10-CM | POA: Diagnosis present

## 2016-10-03 DIAGNOSIS — K921 Melena: Secondary | ICD-10-CM | POA: Diagnosis not present

## 2016-10-03 HISTORY — DX: Gastrointestinal hemorrhage, unspecified: K92.2

## 2016-10-03 LAB — CBC
HCT: 27.2 % — ABNORMAL LOW (ref 36.0–46.0)
HCT: 25.9 % — ABNORMAL LOW (ref 36.0–46.0)
HCT: 27 % — ABNORMAL LOW (ref 36.0–46.0)
HCT: 26.3 % — ABNORMAL LOW (ref 36.0–46.0)
Hemoglobin: 8.9 g/dL — ABNORMAL LOW (ref 12.0–15.0)
Hemoglobin: 8.6 g/dL — ABNORMAL LOW (ref 12.0–15.0)
Hemoglobin: 8.6 g/dL — ABNORMAL LOW (ref 12.0–15.0)
Hemoglobin: 8.7 g/dL — ABNORMAL LOW (ref 12.0–15.0)
MCH: 28.1 pg (ref 26.0–34.0)
MCH: 28.9 pg (ref 26.0–34.0)
MCH: 27.5 pg (ref 26.0–34.0)
MCH: 28.9 pg (ref 26.0–34.0)
MCHC: 32.7 g/dL (ref 30.0–36.0)
MCHC: 33.2 g/dL (ref 30.0–36.0)
MCHC: 31.9 g/dL (ref 30.0–36.0)
MCHC: 33.1 g/dL (ref 30.0–36.0)
MCV: 85.8 fL (ref 78.0–100.0)
MCV: 86.9 fL (ref 78.0–100.0)
MCV: 86.3 fL (ref 78.0–100.0)
MCV: 87.4 fL (ref 78.0–100.0)
Platelets: 203 10*3/uL (ref 150–400)
Platelets: 185 10*3/uL (ref 150–400)
Platelets: 202 10*3/uL (ref 150–400)
Platelets: 193 10*3/uL (ref 150–400)
RBC: 3.17 MIL/uL — ABNORMAL LOW (ref 3.87–5.11)
RBC: 2.98 MIL/uL — ABNORMAL LOW (ref 3.87–5.11)
RBC: 3.13 MIL/uL — ABNORMAL LOW (ref 3.87–5.11)
RBC: 3.01 MIL/uL — ABNORMAL LOW (ref 3.87–5.11)
RDW: 15.3 % (ref 11.5–15.5)
RDW: 15.4 % (ref 11.5–15.5)
RDW: 15.4 % (ref 11.5–15.5)
RDW: 15.7 % — ABNORMAL HIGH (ref 11.5–15.5)
WBC: 7.9 10*3/uL (ref 4.0–10.5)
WBC: 7.7 10*3/uL (ref 4.0–10.5)
WBC: 7.4 10*3/uL (ref 4.0–10.5)
WBC: 8.7 10*3/uL (ref 4.0–10.5)

## 2016-10-03 MED ORDER — POTASSIUM CHLORIDE IN NACL 20-0.9 MEQ/L-% IV SOLN
INTRAVENOUS | Status: AC
  Administered 2016-10-03: 02:00:00 via INTRAVENOUS
  Filled 2016-10-03 (×2): qty 1000

## 2016-10-03 MED ORDER — METOPROLOL SUCCINATE ER 100 MG PO TB24
100.0000 mg | ORAL_TABLET | Freq: Every day | ORAL | Status: DC
  Administered 2016-10-03 – 2016-10-05 (×3): 100 mg via ORAL
  Filled 2016-10-03 (×3): qty 1

## 2016-10-03 MED ORDER — SODIUM CHLORIDE 0.9% FLUSH
3.0000 mL | Freq: Two times a day (BID) | INTRAVENOUS | Status: DC
  Administered 2016-10-03 – 2016-10-05 (×5): 3 mL via INTRAVENOUS

## 2016-10-03 NOTE — Consult Note (Signed)
Reason for Consult: Rectal bleeding. Referring Physician: THP  Monica [name] is an 81 y.o. female.  HPI: Monica [name] is a 81 year old black female with multiple medical problems listed below who was admitted to [facility] on 09/30/2016 for a GI bleed. She underwent a colonoscopy done by Dr. call Carlean Purl on 10/01/2016 that revealed pandiverticulosis. She was discharged yesterday and was readmitted last night for ongoing hematochezia. Patient claims she is doing well after discharge she went home and tried to eat some soft foods. It wasn't a about 5 PM yesterday when she passed some foul-smelling flatus and had 7  bloody bowel movements thereafter. She subsequently became very weak and dizzy and called 911 who transported her to the hospital. Sh there is no history of constipation or diarrhea. She's never had rectal bleeding like this before She  denies having any abdominal pain fever chills or rigors. Her hemoglobin has dropped from 11.1 g/dL 8.6 g/dL today  Past Medical History:  Diagnosis Date  . Allergic rhinitis   . Anxiety   . Benign neoplasm of breast 2013  . Cardiomegaly   . Compulsive overeating   . Depression   . Diffuse cystic mastopathy 2013  . Edema   . Gout   . HTN (hypertension)   . Hyperlipidemia   . Lump or mass in breast 2012  . OA (osteoarthritis) of knee    with injections  . Morbid obesity   . Other abnormal glucose   . Personal history of tobacco use, presenting hazards to health   . Sciatica   . Sinus infection 2010  . Unspecified sleep apnea    Past Surgical History:  Procedure Laterality Date  . adenosine cardiolite  1/08   low risk   . APPENDECTOMY    . BREAST LUMPECTOMY     right x1 ('89) left x2 ('70s, '90)  . CARDIAC CATHETERIZATION  2001   normal per pt  . COLONOSCOPY  11/02   diverticulosis  . COLONOSCOPY  9/06   diverticulosis, hemorhoids  . COLONOSCOPY N/A 10/01/2016   Procedure: COLONOSCOPY;  Surgeon: Gatha Mayer, MD;   Location: [facility];  Service: Endoscopy;  Laterality: N/A;  . dexa  3/02   normal  . EYE SURGERY  2012   cataract  . knee replacement Right 9/11   R. Dr. Telford Nab  . sleep study  5/05  . TONSILLECTOMY    . TOTAL ABDOMINAL HYSTERECTOMY     fibroids, age of 64  . TOTAL KNEE ARTHROPLASTY Left 2012  . US TRANSVAGINAL PELVIC MODIFIED  2000, 2001   Family History  Problem Relation Age of Onset  . Stroke Father   . Hypertension Mother        (a lot of animosity in their relationship)  . Heart failure Mother   . Gout Unknown        whole family   . Prostate cancer Brother   . Hepatitis Brother        Hep C; alcoholism (terminal)  . Breast cancer Sister   . Other Brother        drug addiction   Social History:  reports that she has quit smoking. Her smoking use included Cigarettes. She has a 10.00 pack-year smoking history. She has never used smokeless tobacco. She reports that she drinks alcohol. She reports that she does not use drugs.  Allergies:  Allergies  Allergen Reactions  . Buspirone Hcl     REACTION: made her sleepy, ? swollen legs  .  Lisinopril     REACTION: dizziness  . Prozac [Fluoxetine Hcl]     sleepy   Medications: I have reviewed the patient's current medications.  Results for orders placed or performed during the hospital encounter of 10/02/16 (from the past 48 hour(s))  Comprehensive metabolic panel     Status: Abnormal   Collection Time: 10/02/16  9:57 PM  Result Value Ref Range   Sodium 142 135 - 145 mmol/L   Potassium 3.7 3.5 - 5.1 mmol/L   Chloride 110 101 - 111 mmol/L   CO2 25 22 - 32 mmol/L   Glucose, Bld 123 (H) 65 - 99 mg/dL   BUN 16 6 - 20 mg/dL   Creatinine, Ser 0.88 0.44 - 1.00 mg/dL   Calcium 8.8 (L) 8.9 - 10.3 mg/dL   Total Protein 6.5 6.5 - 8.1 g/dL   Albumin 3.6 3.5 - 5.0 g/dL   AST 25 15 - 41 U/L   ALT 17 14 - 54 U/L   Alkaline Phosphatase 68 38 - 126 U/L   Total Bilirubin 0.5 0.3 - 1.2 mg/dL   GFR calc non Af Amer >60 >60  mL/min   GFR calc Af Amer >60 >60 mL/min    Comment: (NOTE) The eGFR has been calculated using the CKD EPI equation. This calculation has not been validated in all clinical situations. eGFR's persistently <60 mL/min signify possible Chronic Kidney Disease.    Anion gap 7 5 - 15  CBC     Status: Abnormal   Collection Time: 10/02/16  9:57 PM  Result Value Ref Range   WBC 7.7 4.0 - 10.5 K/uL   RBC 3.74 (L) 3.87 - 5.11 MIL/uL   Hemoglobin 10.3 (L) 12.0 - 15.0 g/dL   HCT 32.1 (L) 36.0 - 46.0 %   MCV 85.8 78.0 - 100.0 fL   MCH 27.5 26.0 - 34.0 pg   MCHC 32.1 30.0 - 36.0 g/dL   RDW 15.2 11.5 - 15.5 %   Platelets 195 150 - 400 K/uL  Type and screen [place]     Status: None   Collection Time: 10/02/16 10:15 PM  Result Value Ref Range   ABO/RH(D) O POS    Antibody Screen NEG    Sample Expiration 10/05/2016   POC occult blood, ED     Status: Abnormal   Collection Time: 10/02/16 10:28 PM  Result Value Ref Range   Fecal Occult Bld POSITIVE (A) NEGATIVE  CBC     Status: Abnormal   Collection Time: 10/03/16  3:05 AM  Result Value Ref Range   WBC 7.9 4.0 - 10.5 K/uL   RBC 3.17 (L) 3.87 - 5.11 MIL/uL   Hemoglobin 8.9 (L) 12.0 - 15.0 g/dL   HCT 27.2 (L) 36.0 - 46.0 %   MCV 85.8 78.0 - 100.0 fL   MCH 28.1 26.0 - 34.0 pg   MCHC 32.7 30.0 - 36.0 g/dL   RDW 15.3 11.5 - 15.5 %   Platelets 203 150 - 400 K/uL  CBC     Status: Abnormal   Collection Time: 10/03/16  8:59 AM  Result Value Ref Range   WBC 7.7 4.0 - 10.5 K/uL   RBC 2.98 (L) 3.87 - 5.11 MIL/uL   Hemoglobin 8.6 (L) 12.0 - 15.0 g/dL   HCT 25.9 (L) 36.0 - 46.0 %   MCV 86.9 78.0 - 100.0 fL   MCH 28.9 26.0 - 34.0 pg   MCHC 33.2 30.0 - 36.0 g/dL   RDW  15.4 11.5 - 15.5 %   Platelets 185 150 - 400 K/uL  CBC     Status: Abnormal   Collection Time: 10/03/16  1:43 PM  Result Value Ref Range   WBC 7.4 4.0 - 10.5 K/uL   RBC 3.13 (L) 3.87 - 5.11 MIL/uL   Hemoglobin 8.6 (L) 12.0 - 15.0 g/dL   HCT 27.0 (L) 36.0 -  46.0 %   MCV 86.3 78.0 - 100.0 fL   MCH 27.5 26.0 - 34.0 pg   MCHC 31.9 30.0 - 36.0 g/dL   RDW 15.4 11.5 - 15.5 %   Platelets 202 150 - 400 K/uL   ROS Blood pressure (!) 151/77, pulse 76, temperature 99.5 F (37.5 C), temperature source Oral, resp. rate 20, height '5\' 7"'  (1.702 m), weight 113.2 kg (249 lb 9 oz), SpO2 97 %. Physical Exam  Constitutional: She is oriented to person, place, and time. She appears well-developed and well-nourished.  Morbidly obese  HENT:  Head: Normocephalic and atraumatic.  Eyes: Pupils are equal, round, and reactive to light. Conjunctivae and EOM are normal.  Neck: Normal range of motion. Neck supple.  Cardiovascular: Normal rate and regular rhythm.   Respiratory: Effort normal and breath sounds normal.  GI: Soft. Bowel sounds are normal.  NTNABS  Musculoskeletal: Normal range of motion.  Neurological: She is alert and oriented to person, place, and time.  Skin: Skin is warm and dry.  Psychiatric: She has a normal mood and affect. Her behavior is normal. Judgment and thought content normal.   Assessment/Plan: 1) Posthemorrhagic anemia secondary to diverticular bleed. We'll continue monitoring her closely and if she rebleeds a bleeding scan will be done as discussed with Dr. Broadus John. Agree with clear liquids for now.  2) HTN  3) Gout. 4) Morbid obesity.  Monica [name] 10/03/2016, 6:37 PM

## 2016-10-03 NOTE — Progress Notes (Signed)
PROGRESS NOTE    Monica [name]  ONG:[id number] DOB: 1936-02-19 DOA: 10/02/2016 PCP: [name], DO  Brief Narrative: This is a 81 year old female with a PMH of diverticula, HTN, GERD, obesity, and cardiomegaly admitted to the hospital for GI bleeding.   history of black stools for 2 days as well as some episodes with red clots , hemoglobin has been relatively stable. Her records state and she confirmed two colonoscopies in 2002 and 2006 with findings of diverticula.    Assessment & Plan:    Acute diverticular bleed -underwent colonoscopy 8/11 with evidence of past bleeding from the diverticular opening -was stable discharged home without any active bleeding 8/12 am and readmitted same night with recurrent hematochezia -pt reports 6 episodes of bleeding last evening, no further episodes so far since admission -Hb down to 8.9 from 11 at discharge yesterday am, monitor -tagged RBC scan if rebleeds -GI consult  Hypertension -resume Toprol -holding amlodipine losartan and Lasix  Chronic diastolic CHF -compensated, lasix on hold, cut down IVF  Gout -Stable resume allopurinol at discharge   DVT prophylaxis: SCDs Code Status: Full code Family Communication: No family at bedside Disposition Plan: Home when stable  Consultants:   [facility]   Subjective: Was very weak and dizzy following recurrent hematochezia yesterday -no further bleeding today yet  Objective: Vitals:   10/03/16 0100 10/03/16 0112 10/03/16 0149 10/03/16 0546  BP: (!) 145/68 (!) 145/68 (!) 147/67 (!) 133/55  Pulse: 75 71  70  Resp: (!) 23 (!) 22 (!) 21 20  Temp:   97.9 F (36.6 C) 97.7 F (36.5 C)  TempSrc:   Oral Oral  SpO2: 96% 95% 99% 99%  Weight:   113.2 kg (249 lb 9 oz)   Height:   5\' 7"  (1.702 m)     Intake/Output Summary (Last 24 hours) at 10/03/16 1222 Last data filed at 10/03/16 1000  Gross per 24 hour  Intake           788.33 ml  Output                0 ml  Net           788.33 ml    Filed Weights   10/02/16 2154 10/03/16 0149  Weight: 114.8 kg (253 lb) 113.2 kg (249 lb 9 oz)    Examination:  Gen: Awake, Alert, Oriented X 3, obese, no distress HEENT: PERRLA, Neck supple, no JVD Lungs: Good air movement bilaterally, CTAB CVS: RRR,No Gallops,Rubs or new Murmurs Abd: soft, Non tender, non distended, BS present Extremities: No Cyanosis, Clubbing or edema Skin: no new rashes    Data Reviewed:   CBC:  Recent Labs Lab 09/30/16 1424  10/01/16 2133 10/02/16 0858 10/02/16 2157 10/03/16 0305 10/03/16 0859  WBC 8.1  < > 8.0 7.0 7.7 7.9 7.7  NEUTROABS 4.9  --   --   --   --   --   --   HGB 11.2*  < > 10.8* 11.0* 10.3* 8.9* 8.6*  HCT 34.3*  < > 33.5* 33.8* 32.1* 27.2* 25.9*  MCV 87.1  < > 87.7 86.9 85.8 85.8 86.9  PLT 231  < > 228 233 195 203 185  < > = values in this interval not displayed. Basic Metabolic Panel:  Recent Labs Lab 09/30/16 1424 10/02/16 0405 10/02/16 2157  NA 143 143 142  K 4.2 3.6 3.7  CL 109 108 110  CO2 28 26 25   GLUCOSE 94 107*  123*  BUN 21* 12 16  CREATININE 0.73 0.71 0.88  CALCIUM 9.2 9.3 8.8*   GFR: Estimated Creatinine Clearance: 67.3 mL/min (by C-G formula based on SCr of 0.88 mg/dL). Liver Function Tests:  Recent Labs Lab 09/30/16 1424 10/02/16 2157  AST 20 25  ALT 16 17  ALKPHOS 83 68  BILITOT 0.4 0.5  PROT 6.9 6.5  ALBUMIN 4.0 3.6    Recent Labs Lab 09/30/16 1424  LIPASE 29   No results for input(s): AMMONIA in the last 168 hours. Coagulation Profile:  Recent Labs Lab 09/30/16 1424  INR 0.90   Cardiac Enzymes: No results for input(s): CKTOTAL, CKMB, CKMBINDEX, TROPONINI in the last 168 hours. BNP (last 3 results) No results for input(s): PROBNP in the last 8760 hours. HbA1C: No results for input(s): HGBA1C in the last 72 hours. CBG:  Recent Labs Lab 10/01/16 0553 10/01/16 1205 10/01/16 1712 10/02/16 0003 10/02/16 0540  GLUCAP 83 92 113* 100* 101*   Lipid Profile: No results for  input(s): CHOL, HDL, LDLCALC, TRIG, CHOLHDL, LDLDIRECT in the last 72 hours. Thyroid Function Tests: No results for input(s): TSH, T4TOTAL, FREET4, T3FREE, THYROIDAB in the last 72 hours. Anemia Panel: No results for input(s): VITAMINB12, FOLATE, FERRITIN, TIBC, IRON, RETICCTPCT in the last 72 hours. Urine analysis:    Component Value Date/Time   COLORURINE AMBER BIOCHEMICALS MAY BE AFFECTED BY COLOR (A) 04/23/2010 1210   APPEARANCEUR CLOUDY (A) 04/23/2010 1210   LABSPEC 1.027 04/23/2010 1210   PHURINE 6.5 04/23/2010 1210   GLUCOSEU NEGATIVE 04/23/2010 1210   HGBUR NEGATIVE 04/23/2010 1210   BILIRUBINUR NEGATIVE 04/23/2010 1210   KETONESUR TRACE (A) 04/23/2010 1210   PROTEINUR NEGATIVE 04/23/2010 1210   UROBILINOGEN 0.2 04/23/2010 1210   NITRITE NEGATIVE 04/23/2010 1210   LEUKOCYTESUR SMALL (A) 04/23/2010 1210   Sepsis Labs: @LABRCNTIP (procalcitonin:4,lacticidven:4)  )No results found for this or any previous visit (from the past 240 hour(s)).       Radiology Studies: No results found.      Scheduled Meds: . metoprolol succinate  100 mg Oral Daily  . sodium chloride flush  3 mL Intravenous Q12H   Continuous Infusions: . 0.9 % NaCl with KCl 20 mEq / L 10 mL/hr at 10/03/16 1101     LOS: 0 days    Time spent: 74min    Domenic Polite, MD [facility] Pager [phone]  If 7PM-7AM, please contact night-coverage [email or web address] Password [facility] 10/03/2016, 12:22 PM

## 2016-10-03 NOTE — Progress Notes (Signed)
We were called to re-consult on this patient. This is actually a Dr. [name] patient, we were covering for them this weekend when procedures were done. I have contacted Dr. Benson Norway and she will see the patient in consult.  Ellouise Newer, [facility]

## 2016-10-03 NOTE — H&P (Signed)
History and Physical    Monica [name] OAC:[id number] DOB: 11-14-1935 DOA: 10/02/2016  PCP: [name], DO   Patient coming from: Home  Chief Complaint: Rectal bleeding.  HPI: Monica [name] is a 81 y.o. female with medical history significant for- diverticulosis, gout, HTN, OSA. Patient presented to the ED this evening with complaints of 6-7 episodes of bright red blood per rectum, without stool. Patient endorses some right lower quadrant pain. No nausea, no vomiting, no epigastric pain. Also noted dizziness, started today with onset of bleeding per rectum. Patient was admitted 8/10- 8/12, for lower GI bleed, but presented back to the ED. Patient had colonoscopy on admission which showed many small and large mouth diverticula in the entire colon, with evidence of past bleeding, a clot in shape of fecalith was seen and no bleeding, no melena, recommendations by GI for Supportive care.   ED Course: Vitals showed- reassuring blood pressure 143/65, pulse 68, blood work showed hemoglobin 10.3- down from 11 on discharge earlier in the day, Platelet stable at 195. Pt was given N/s saline in ED.  Review of Systems:  CONSTITUTIONAL- No Fever, weightloss, night sweat or change in appetite. SKIN- No Rash, colour changes or itching. EYES- No Vision loss, pain, redness, double or blurred vision. EARS- No vertigo, hearing loss or ear discharge. Mouth/throat- No Sorethroat, dentures, or bleeding gums. RESPIRATORY- No Cough or SOB. CARDIAC- No Palpitations, DOE, PND or chest pain. URINARY- No Frequency, urgency, straining or dysuria. NEUROLOGIC- No Numbness, syncope, seizures or burning. [facility]- Denies depression or anxiety.  Past Medical History:  Diagnosis Date  . Allergic rhinitis   . Anxiety   . Benign neoplasm of breast 2013  . Cardiomegaly   . Compulsive overeating   . Depression   . Diffuse cystic mastopathy 2013  . Edema   . Gout   . HTN (hypertension)   . Hyperlipidemia   .  Lump or mass in breast 2012  . OA (osteoarthritis) of knee    with injections  . Obesity   . Other abnormal glucose   . Personal history of tobacco use, presenting hazards to health   . Sciatica   . Sinus infection 2010  . Unspecified sleep apnea     Past Surgical History:  Procedure Laterality Date  . adenosine cardiolite  1/08   low risk   . APPENDECTOMY    . BREAST LUMPECTOMY     right x1 ('89) left x2 ('70s, '90)  . CARDIAC CATHETERIZATION  2001   normal per pt  . COLONOSCOPY  11/02   diverticulosis  . COLONOSCOPY  9/06   diverticulosis, hemorhoids  . COLONOSCOPY N/A 10/01/2016   Procedure: COLONOSCOPY;  Surgeon: Gatha Mayer, MD;  Location: [facility];  Service: Endoscopy;  Laterality: N/A;  . dexa  3/02   normal  . EYE SURGERY  2012   cataract  . knee replacement Right 9/11   R. Dr. Telford Nab  . sleep study  5/05  . TONSILLECTOMY    . TOTAL ABDOMINAL HYSTERECTOMY     fibroids, age of 54  . TOTAL KNEE ARTHROPLASTY Left 2012  . US TRANSVAGINAL PELVIC MODIFIED  2000, 2001     reports that she has quit smoking. Her smoking use included Cigarettes. She has a 10.00 pack-year smoking history. She has never used smokeless tobacco. She reports that she drinks alcohol. She reports that she does not use drugs.  Allergies  Allergen Reactions  . Buspirone Hcl     REACTION: made  her sleepy, ? swollen legs  . Lisinopril     REACTION: dizziness  . Prozac [Fluoxetine Hcl]     sleepy    Family History  Problem Relation Age of Onset  . Stroke Father   . Hypertension Mother        (a lot of animosity in their relationship)  . Heart failure Mother   . Gout Unknown        whole family   . Prostate cancer Brother   . Hepatitis Brother        Hep C; alcoholism (terminal)  . Breast cancer Sister   . Other Brother        drug addiction   Prior to Admission medications   Medication Sig Start Date End Date Taking? Authorizing Provider  ACCU-CHEK AVIVA PLUS test strip  CHECK GLUCOSE WITH [facility] MEAL AND AT BEDTIME AS DIRECTED 11/28/14  Yes Tower, Wynelle Fanny, MD  ACCU-CHEK SOFTCLIX LANCETS lancets Check blood sugar once daily and as needed. Dx R73.01 02/03/15  Yes Tower, Wynelle Fanny, MD  allopurinol (ZYLOPRIM) 100 MG tablet Take 2 tablets (200 mg total) by mouth daily. 09/28/16  Yes Hulan Saas M, DO  amLODipine (NORVASC) 5 MG tablet TAKE 1 TABLET BY MOUTH DAILY 08/08/16  Yes Belva Crome, MD  calcium carbonate (OS-CAL) 600 MG TABS Take 600 mg by mouth daily.    Yes [provider]  Cholecalciferol (VITAMIN D3) 2000 units capsule Take 2,000 Units by mouth daily.   Yes [provider]  furosemide (LASIX) 40 MG tablet TAKE 1 TABLET BY MOUTH DAILY 08/08/16  Yes Cook, Jayce G, DO  MAGNESIUM CARBONATE PO Take 250 mg by mouth daily.   Yes [provider]  metoprolol succinate (TOPROL-XL) 100 MG 24 hr tablet TAKE 1 TABLET BY MOUTH DAILY WITH OR IMMEDIATLY FOLLOWING A MEAL 06/06/16  Yes Cook, Jayce G, DO  Omega-3 Fatty Acids (FISH OIL PO) Take 500 capsules by mouth daily.    Yes [provider]  potassium chloride SA (K-DUR,KLOR-CON) 20 MEQ tablet Take 1 tablet (20 mEq total) by mouth daily. 02/26/16  Yes Cook, Jayce G, DO  Ferrous Sulfate (IRON) 325 (65 Fe) MG TABS Take 1 tablet (325 mg total) by mouth daily. 10/10/16   Domenic Polite, MD    Physical Exam: Vitals:   10/03/16 0030 10/03/16 0100 10/03/16 0112 10/03/16 0149  BP: (!) 145/73 (!) 145/68 (!) 145/68 (!) 147/67  Pulse: 75 75 71   Resp: 18 (!) 23 (!) 22 (!) 21  Temp:    97.9 F (36.6 C)  TempSrc:    Oral  SpO2: 97% 96% 95% 99%  Weight:    113.2 kg (249 lb 9 oz)  Height:    5\' 7"  (1.702 m)    Constitutional: NAD, calm, comfortable Vitals:   10/03/16 0030 10/03/16 0100 10/03/16 0112 10/03/16 0149  BP: (!) 145/73 (!) 145/68 (!) 145/68 (!) 147/67  Pulse: 75 75 71   Resp: 18 (!) 23 (!) 22 (!) 21  Temp:    97.9 F (36.6 C)  TempSrc:    Oral  SpO2: 97% 96% 95% 99%  Weight:     113.2 kg (249 lb 9 oz)  Height:    5\' 7"  (1.702 m)   Eyes: PERRL, lids and conjunctivae normal, pleasant lady. ENMT: Mucous membranes are moist. Posterior pharynx clear of any exudate or lesions.Normal dentition.  Neck: normal, supple, no masses, no thyromegaly Respiratory: clear to auscultation bilaterally, no wheezing, no crackles.  Normal respiratory effort. No accessory muscle use.  Cardiovascular: Regular rate and rhythm, no murmurs / rubs / gallops. No extremity edema. 2+ pedal pulses. No carotid bruits.  Abdomen: no tenderness, obese, no masses palpated. No hepatosplenomegaly. Bowel sounds positive.  Musculoskeletal: no clubbing / cyanosis. No joint deformity upper and lower extremities. Good ROM, no contractures. Normal muscle tone.  Skin: no rashes, lesions, ulcers. No induration Neurologic: CN 2-12 grossly intact. Sensation intact, DTR normal. Strength 5/5 in all 4.  Psychiatric: Normal judgment and insight. Alert and oriented x 3. Normal mood.   Labs on Admission: I have personally reviewed following labs and imaging studies  CBC:  Recent Labs Lab 09/30/16 1424  10/01/16 0047 10/01/16 0931 10/01/16 2133 10/02/16 0858 10/02/16 2157  WBC 8.1  < > 7.6 6.3 8.0 7.0 7.7  NEUTROABS 4.9  --   --   --   --   --   --   HGB 11.2*  < > 11.2* 10.8* 10.8* 11.0* 10.3*  HCT 34.3*  < > 34.4* 33.5* 33.5* 33.8* 32.1*  MCV 87.1  < > 86.6 87.2 87.7 86.9 85.8  PLT 231  < > 217 229 228 233 195  < > = values in this interval not displayed. Basic Metabolic Panel:  Recent Labs Lab 09/30/16 1424 10/02/16 0405 10/02/16 2157  NA 143 143 142  K 4.2 3.6 3.7  CL 109 108 110  CO2 28 26 25   GLUCOSE 94 107* 123*  BUN 21* 12 16  CREATININE 0.73 0.71 0.88  CALCIUM 9.2 9.3 8.8*   Liver Function Tests:  Recent Labs Lab 09/30/16 1424 10/02/16 2157  AST 20 25  ALT 16 17  ALKPHOS 83 68  BILITOT 0.4 0.5  PROT 6.9 6.5  ALBUMIN 4.0 3.6    Recent Labs Lab 09/30/16 1424  LIPASE 29    Coagulation Profile:  Recent Labs Lab 09/30/16 1424  INR 0.90   CBG:  Recent Labs Lab 10/01/16 0553 10/01/16 1205 10/01/16 1712 10/02/16 0003 10/02/16 0540  GLUCAP 83 92 113* 100* 101*   Urine analysis:    Component Value Date/Time   COLORURINE AMBER BIOCHEMICALS MAY BE AFFECTED BY COLOR (A) 04/23/2010 1210   APPEARANCEUR CLOUDY (A) 04/23/2010 1210   LABSPEC 1.027 04/23/2010 1210   PHURINE 6.5 04/23/2010 1210   GLUCOSEU NEGATIVE 04/23/2010 1210   HGBUR NEGATIVE 04/23/2010 1210   BILIRUBINUR NEGATIVE 04/23/2010 1210   KETONESUR TRACE (A) 04/23/2010 1210   PROTEINUR NEGATIVE 04/23/2010 1210   UROBILINOGEN 0.2 04/23/2010 1210   NITRITE NEGATIVE 04/23/2010 1210   LEUKOCYTESUR SMALL (A) 04/23/2010 1210    Radiological Exams on Admission: No results found.   EKG None  Assessment/Plan Principal Problem:   Hematochezia Active Problems:   Hyperlipidemia   Gout   Morbid obesity ([place])   Depression with anxiety   Essential hypertension   Obstructive sleep apnea   Chronic diastolic heart failure (HCC)   Diverticulosis of colon with hemorrhage   Lower GI bleed  Hematochezia- bright red blood per rectum. Most likely lower GI blood loss. Bloody bowel movements stopped prior to discharge but resumed on arrival at home. Diverticulosis with evidence of past bleed on recent colonoscopy 10/01/16. Hemoglobin 10.3, discharge hemoglobin earlier today 11. Blood pressure stable no tachycardia. - Obs, tele - CBC every 6 hourly X 4  - Type and match already done in ED - Goal hemoglobin > 8, with acute blood loss - GI consult a.m - NPO - IV fluids normal saline+20  KCL at 100 mL/h for 12 hours  Hypertension- blood pressure stable- home meds, amlodipine, Lasix, metoprolol 100 mg extended release, Losartan was discontinued on discharge. - Will hold all antihypertensives for now, with complaints of dizziness  DIASTOLIC HEART FAILURE- appears euvolemic. Medications Lasix and  metoprolol held. Last echo on file 2008 EF 65% mild increased LV wall thickness.  GOUT- no acute flare. - Hold allopurinol while nothing by mouth  DVT prophylaxis: SCDs  Code Status: Full  Family Communication: Patients daughter present at bedside. Disposition Plan: Home, 2-3 days. Consults called:  GI consult Am Admission status: Obs, tele   Bethena Roys MD [facility] Pager [phone]   If 7PM-7AM, please contact night-coverage [email or web address] Password [facility]  10/03/2016, 2:37 AM

## 2016-10-03 NOTE — ED Notes (Signed)
Call report to [place], [place] at [phone]

## 2016-10-04 DIAGNOSIS — K5731 Diverticulosis of large intestine without perforation or abscess with bleeding: Secondary | ICD-10-CM | POA: Diagnosis not present

## 2016-10-04 DIAGNOSIS — I10 Essential (primary) hypertension: Secondary | ICD-10-CM

## 2016-10-04 DIAGNOSIS — I5032 Chronic diastolic (congestive) heart failure: Secondary | ICD-10-CM | POA: Diagnosis not present

## 2016-10-04 DIAGNOSIS — K921 Melena: Secondary | ICD-10-CM | POA: Diagnosis not present

## 2016-10-04 LAB — CBC
HCT: 26 % — ABNORMAL LOW (ref 36.0–46.0)
Hemoglobin: 8.6 g/dL — ABNORMAL LOW (ref 12.0–15.0)
MCH: 29 pg (ref 26.0–34.0)
MCHC: 33.1 g/dL (ref 30.0–36.0)
MCV: 87.5 fL (ref 78.0–100.0)
Platelets: 190 10*3/uL (ref 150–400)
RBC: 2.97 MIL/uL — ABNORMAL LOW (ref 3.87–5.11)
RDW: 15.7 % — ABNORMAL HIGH (ref 11.5–15.5)
WBC: 8.9 10*3/uL (ref 4.0–10.5)

## 2016-10-04 MED ORDER — SENNOSIDES-DOCUSATE SODIUM 8.6-50 MG PO TABS
1.0000 | ORAL_TABLET | Freq: Two times a day (BID) | ORAL | Status: DC
  Administered 2016-10-04 – 2016-10-05 (×2): 1 via ORAL
  Filled 2016-10-04 (×2): qty 1

## 2016-10-04 MED ORDER — PANTOPRAZOLE SODIUM 40 MG PO TBEC
40.0000 mg | DELAYED_RELEASE_TABLET | Freq: Every day | ORAL | Status: DC
  Administered 2016-10-04 – 2016-10-05 (×2): 40 mg via ORAL
  Filled 2016-10-04 (×2): qty 1

## 2016-10-04 NOTE — Progress Notes (Signed)
PROGRESS NOTE    Monica [name]  FWY:[id number] DOB: Apr 26, 1935 DOA: 10/02/2016 PCP: [name], DO  Brief Narrative: This is a 81 year old female with a PMH of diverticula, HTN, GERD, obesity, and cardiomegaly readmitted to the hospital  with lower GI bleed. She was just discharged 8/12 after admission for diverticular bleeding during which she underwent a colonoscopy ,Had a stable hemoglobin and no further bleeding, readmitted same night following 6-7 episodes of hematochezia and drop in hemoglobin from 11 to 8.6.   Assessment & Plan:    Acute diverticular bleed -underwent colonoscopy 8/11 with evidence of past bleeding from the diverticular opening -was stable discharged home without any active bleeding 8/12 am and readmitted same night with recurrent hematochezia -pt reports 6 episodes of bleeding 8/12 evening, no further episodes so far since admission -hemoglobin stable in the mid 8 range -Appreciate gastroenterology consult per Dr. [name] , plan for tagged RBC scan if rebleeding  -Since no active bleeding for >30 hours, will advance diet today  -Monitor hemoglobin   Hypertension -resume Toprol -holding amlodipine losartan and Lasix  Chronic diastolic CHF -compensated, lasix on hold, -Stopped IV fluids  Gout -Stable resume allopurinol at discharge  GERD -add PPI    DVT prophylaxis: SCDs Code Status: Full code Family Communication: No family at bedside Disposition Plan: Home when stable  Consultants:   [facility]   Subjective: No bleeding episodes today yet, no bowel movement -Complains of mild heartburn-like discomfort going up her esophagus  Objective: Vitals:   10/03/16 1317 10/03/16 2211 10/04/16 0455 10/04/16 1312  BP: (!) 151/77 (!) 151/68 (!) 150/50 (!) 142/62  Pulse: 76 80 77 74  Resp: 20 20 20 20   Temp: 99.5 F (37.5 C) (!) 97.5 F (36.4 C) 98.2 F (36.8 C) 98.4 F (36.9 C)  TempSrc: Oral Oral Oral Oral  SpO2: 97% 99% 98% 99%  Weight:        Height:        Intake/Output Summary (Last 24 hours) at 10/04/16 1404 Last data filed at 10/04/16 0900  Gross per 24 hour  Intake             1120 ml  Output                1 ml  Net             1119 ml   Filed Weights   10/02/16 2154 10/03/16 0149  Weight: 114.8 kg (253 lb) 113.2 kg (249 lb 9 oz)    Examination:  Gen: Elderly, obese African-American female Awake, Alert, Oriented X 3,  HEENT: PERRLA, Neck supple, no JVD Lungs: Good air movement bilaterally, CTAB CVS: RRR,No Gallops,Rubs or new Murmurs Abd: soft, Non tender, non distended, BS present Extremities: Trace edema Skin: no new rashes     Data Reviewed:   CBC:  Recent Labs Lab 09/30/16 1424  10/03/16 0305 10/03/16 0859 10/03/16 1343 10/03/16 2118 10/04/16 0843  WBC 8.1  < > 7.9 7.7 7.4 8.7 8.9  NEUTROABS 4.9  --   --   --   --   --   --   HGB 11.2*  < > 8.9* 8.6* 8.6* 8.7* 8.6*  HCT 34.3*  < > 27.2* 25.9* 27.0* 26.3* 26.0*  MCV 87.1  < > 85.8 86.9 86.3 87.4 87.5  PLT 231  < > 203 185 202 193 190  < > = values in this interval not displayed. Basic Metabolic Panel:  Recent Labs Lab  09/30/16 1424 10/02/16 0405 10/02/16 2157  NA 143 143 142  K 4.2 3.6 3.7  CL 109 108 110  CO2 28 26 25   GLUCOSE 94 107* 123*  BUN 21* 12 16  CREATININE 0.73 0.71 0.88  CALCIUM 9.2 9.3 8.8*   GFR: Estimated Creatinine Clearance: 67.3 mL/min (by C-G formula based on SCr of 0.88 mg/dL). Liver Function Tests:  Recent Labs Lab 09/30/16 1424 10/02/16 2157  AST 20 25  ALT 16 17  ALKPHOS 83 68  BILITOT 0.4 0.5  PROT 6.9 6.5  ALBUMIN 4.0 3.6    Recent Labs Lab 09/30/16 1424  LIPASE 29   No results for input(s): AMMONIA in the last 168 hours. Coagulation Profile:  Recent Labs Lab 09/30/16 1424  INR 0.90   Cardiac Enzymes: No results for input(s): CKTOTAL, CKMB, CKMBINDEX, TROPONINI in the last 168 hours. BNP (last 3 results) No results for input(s): PROBNP in the last 8760 hours. HbA1C: No  results for input(s): HGBA1C in the last 72 hours. CBG:  Recent Labs Lab 10/01/16 0553 10/01/16 1205 10/01/16 1712 10/02/16 0003 10/02/16 0540  GLUCAP 83 92 113* 100* 101*   Lipid Profile: No results for input(s): CHOL, HDL, LDLCALC, TRIG, CHOLHDL, LDLDIRECT in the last 72 hours. Thyroid Function Tests: No results for input(s): TSH, T4TOTAL, FREET4, T3FREE, THYROIDAB in the last 72 hours. Anemia Panel: No results for input(s): VITAMINB12, FOLATE, FERRITIN, TIBC, IRON, RETICCTPCT in the last 72 hours. Urine analysis:    Component Value Date/Time   COLORURINE AMBER BIOCHEMICALS MAY BE AFFECTED BY COLOR (A) 04/23/2010 1210   APPEARANCEUR CLOUDY (A) 04/23/2010 1210   LABSPEC 1.027 04/23/2010 1210   PHURINE 6.5 04/23/2010 1210   GLUCOSEU NEGATIVE 04/23/2010 1210   HGBUR NEGATIVE 04/23/2010 1210   BILIRUBINUR NEGATIVE 04/23/2010 1210   KETONESUR TRACE (A) 04/23/2010 1210   PROTEINUR NEGATIVE 04/23/2010 1210   UROBILINOGEN 0.2 04/23/2010 1210   NITRITE NEGATIVE 04/23/2010 1210   LEUKOCYTESUR SMALL (A) 04/23/2010 1210   Sepsis Labs: @LABRCNTIP (procalcitonin:4,lacticidven:4)  )No results found for this or any previous visit (from the past 240 hour(s)).       Radiology Studies: No results found.      Scheduled Meds: . metoprolol succinate  100 mg Oral Daily  . pantoprazole  40 mg Oral Daily  . senna-docusate  1 tablet Oral BID  . sodium chloride flush  3 mL Intravenous Q12H   Continuous Infusions:    LOS: 0 days    Time spent: 29min    Domenic Polite, MD [facility] Pager [phone]  If 7PM-7AM, please contact night-coverage [email or web address] Password [facility] 10/04/2016, 2:04 PM

## 2016-10-04 NOTE — Care Management CC44 (Signed)
Condition Code 44 Documentation Completed  Patient Details  Name: Monica [name] MRN: [id number] Date of Birth: 1935/10/14   Condition Code 44 given:    Patient signature on Condition Code 44 notice:    Documentation of 2 MD's agreement:    Code 44 added to claim:       Purcell Mouton, RN 10/04/2016, 2:57 PM

## 2016-10-04 NOTE — Care Management Obs Status (Signed)
[place] NOTIFICATION   Patient Details  Name: Jaionna Weisse MRN: [id number] Date of Birth: 03/06/1935   Medicare Observation Status Notification Given:       Purcell Mouton, RN 10/04/2016, 2:58 PM

## 2016-10-04 NOTE — Care Management Obs Status (Signed)
[place] NOTIFICATION   Patient Details  Name: Monica [name] MRN: [id number] Date of Birth: [birth year]   Medicare Observation Status Notification Given:       Purcell Mouton, RN 10/04/2016, 2:57 PM

## 2016-10-04 NOTE — Progress Notes (Signed)
Subjective: No further bleeding since Sunday.  Objective: Vital signs in last 24 hours: Temp:  [97.5 F (36.4 C)-98.4 F (36.9 C)] 98.4 F (36.9 C) (08/14 1312) Pulse Rate:  [74-80] 74 (08/14 1312) Resp:  [20] 20 (08/14 1312) BP: (142-151)/(50-68) 142/62 (08/14 1312) SpO2:  [98 %-99 %] 99 % (08/14 1312) Last BM Date: 10/02/16  Intake/Output from previous day: 08/13 0701 - 08/14 0700 In: 1745 [P.O.:1045; I.V.:700] Out: 1 [Urine:1] Intake/Output this shift: Total I/O In: 120 [P.O.:120] Out: -   General appearance: alert and no distress GI: soft, non-tender; bowel sounds normal; no masses,  no organomegaly  Lab Results:  Recent Labs  10/03/16 1343 10/03/16 2118 10/04/16 0843  WBC 7.4 8.7 8.9  HGB 8.6* 8.7* 8.6*  HCT 27.0* 26.3* 26.0*  PLT 202 193 190   BMET  Recent Labs  10/02/16 0405 10/02/16 2157  NA 143 142  K 3.6 3.7  CL 108 110  CO2 26 25  GLUCOSE 107* 123*  BUN 12 16  CREATININE 0.71 0.88  CALCIUM 9.3 8.8*   LFT  Recent Labs  10/02/16 2157  PROT 6.5  ALBUMIN 3.6  AST 25  ALT 17  ALKPHOS 68  BILITOT 0.5   PT/INR No results for input(s): LABPROT, INR in the last 72 hours. Hepatitis Panel No results for input(s): HEPBSAG, HCVAB, HEPAIGM, HEPBIGM in the last 72 hours. C-Diff No results for input(s): CDIFFTOX in the last 72 hours. Fecal Lactopherrin No results for input(s): FECLLACTOFRN in the last 72 hours.  Studies/Results: No results found.  Medications:  Scheduled: . metoprolol succinate  100 mg Oral Daily  . pantoprazole  40 mg Oral Daily  . senna-docusate  1 tablet Oral BID  . sodium chloride flush  3 mL Intravenous Q12H   Continuous:   Assessment/Plan: 1) Diverticular bleed. 2) Anemia.   HGB is stable and the patient denies any further bleeding.  The difficulty with a diverticular bleed is that it is unpredictable when the next bleed will occur.  Plan: 1) Follow HGB. 2) If no further bleeding by tomorrow AM she can  be discharged home again. 3) Follow up with Dr. [name] in 2-4 weeks.  LOS: 0 days   Jaishaun Mcnab D 10/04/2016, 4:18 PM

## 2016-10-05 DIAGNOSIS — K921 Melena: Secondary | ICD-10-CM | POA: Diagnosis not present

## 2016-10-05 LAB — BASIC METABOLIC PANEL
Anion gap: 6 (ref 5–15)
BUN: 11 mg/dL (ref 6–20)
CO2: 28 mmol/L (ref 22–32)
Calcium: 8.7 mg/dL — ABNORMAL LOW (ref 8.9–10.3)
Chloride: 108 mmol/L (ref 101–111)
Creatinine, Ser: 0.75 mg/dL (ref 0.44–1.00)
GFR calc Af Amer: >60 mL/min (ref >60)
GFR calc non Af Amer: >60 mL/min (ref >60)
Glucose, Bld: 95 mg/dL (ref 65–99)
Potassium: 3.4 mmol/L — ABNORMAL LOW (ref 3.5–5.1)
Sodium: 142 mmol/L (ref 135–145)

## 2016-10-05 LAB — CBC
HCT: 24.9 % — ABNORMAL LOW (ref 36.0–46.0)
Hemoglobin: 8.3 g/dL — ABNORMAL LOW (ref 12.0–15.0)
MCH: 29.1 pg (ref 26.0–34.0)
MCHC: 33.3 g/dL (ref 30.0–36.0)
MCV: 87.4 fL (ref 78.0–100.0)
Platelets: 183 10*3/uL (ref 150–400)
RBC: 2.85 MIL/uL — ABNORMAL LOW (ref 3.87–5.11)
RDW: 15.5 % (ref 11.5–15.5)
WBC: 8.3 10*3/uL (ref 4.0–10.5)

## 2016-10-05 MED ORDER — SENNOSIDES-DOCUSATE SODIUM 8.6-50 MG PO TABS: 1.0000 | ORAL_TABLET | Freq: Two times a day (BID) | ORAL | 0 refills | Status: DC

## 2016-10-05 MED ORDER — PANTOPRAZOLE SODIUM 40 MG PO TBEC: 40.0000 mg | DELAYED_RELEASE_TABLET | Freq: Every day | ORAL | 0 refills | Status: DC

## 2016-10-05 MED ORDER — IRON 325 (65 FE) MG PO TABS: 1.0000 | ORAL_TABLET | Freq: Every day | ORAL | 0 refills | Status: DC

## 2016-10-05 NOTE — Discharge Summary (Signed)
Physician Discharge Summary  Monica [name] OEV:[id number] DOB: 1935-03-01 DOA: 10/02/2016  PCP: [name], DO  Admit date: 10/02/2016 Discharge date: 10/05/2016  Admitted From: Home  Disposition: Home   Recommendations for Outpatient Follow-up:  1. Follow up with PCP in 1-2 weeks 2. Please obtain BMP/CBC in one week 3. Follow with Dr [name] in 1 week.     Discharge Condition: Stable.  CODE STATUS:Full code.  Diet recommendation: Heart Healthy   Brief/Interim Summary: This is a 81 year old female with a PMH of diverticula, HTN, GERD, obesity, and cardiomegaly readmitted to the hospital  with lower GI bleed. She was just discharged 8/12 after admission for diverticular bleeding during which she underwent a colonoscopy ,Had a stable hemoglobin and no further bleeding, readmitted same night following 6-7 episodes of hematochezia and drop in hemoglobin from 11 to 8.6.   Assessment & Plan:    Acute diverticular bleed -underwent colonoscopy 8/11 with evidence of past bleeding from the diverticular opening -was stable discharged home without any active bleeding 8/12 am and readmitted same night with recurrent hematochezia -pt reports 6 episodes of bleeding 8/12 evening, no further episodes so far since admission -hemoglobin stable in the mid 8 range -Appreciate gastroenterology consult per Dr. [name] , plan for tagged RBC scan if rebleeding  -Since no active bleeding for >30 hours, will advance diet today  -Monitor hemoglobin  hb stable, had BM today only small amount of blood in tissue paper when she clean herself.   Hypertension -resume Toprol -holding  losartan Resume lasix at discharge   Chronic diastolic CHF -compensated, resume lasix at discharge -Stopped IV fluids  Gout -Stable resume allopurinol at discharge  GERD -on PPI   Discharge Diagnoses:  Principal Problem:   Hematochezia Active Problems:   Hyperlipidemia   Gout   Morbid obesity ([place])  Depression with anxiety   Essential hypertension   Obstructive sleep apnea   Chronic diastolic heart failure (HCC)   Diverticulosis of colon with hemorrhage   Lower GI bleed    Discharge Instructions  Discharge Instructions    Diet - low sodium heart healthy    Complete by:  As directed    Increase activity slowly    Complete by:  As directed      Allergies as of 10/05/2016      Reactions   Buspirone Hcl    REACTION: made her sleepy, ? swollen legs   Lisinopril    REACTION: dizziness   Prozac [fluoxetine Hcl]    sleepy      Medication List    STOP taking these medications   amLODipine 5 MG tablet Commonly known as:  NORVASC     TAKE these medications   ACCU-CHEK AVIVA PLUS test strip Generic drug:  glucose blood CHECK GLUCOSE WITH EACH MEAL AND AT BEDTIME AS DIRECTED   ACCU-CHEK SOFTCLIX LANCETS lancets Check blood sugar once daily and as needed. Dx R73.01   allopurinol 100 MG tablet Commonly known as:  ZYLOPRIM Take 2 tablets (200 mg total) by mouth daily.   calcium carbonate 600 MG Tabs tablet Commonly known as:  OS-CAL Take 600 mg by mouth daily.   FISH OIL PO Take 500 capsules by mouth daily.   furosemide 40 MG tablet Commonly known as:  LASIX TAKE 1 TABLET BY MOUTH DAILY   Iron 325 (65 Fe) MG Tabs Take 1 tablet (325 mg total) by mouth daily. Start taking on:  10/10/2016   MAGNESIUM CARBONATE PO Take 250 mg by  mouth daily.   metoprolol succinate 100 MG 24 hr tablet Commonly known as:  TOPROL-XL TAKE 1 TABLET BY MOUTH DAILY WITH OR IMMEDIATLY FOLLOWING A MEAL   pantoprazole 40 MG tablet Commonly known as:  PROTONIX Take 1 tablet (40 mg total) by mouth daily.   potassium chloride SA 20 MEQ tablet Commonly known as:  K-DUR,KLOR-CON Take 1 tablet (20 mEq total) by mouth daily.   senna-docusate 8.6-50 MG tablet Commonly known as:  Senokot-S Take 1 tablet by mouth 2 (two) times daily.   Vitamin D3 2000 units capsule Take 2,000 Units by  mouth daily.       Allergies  Allergen Reactions  . Buspirone Hcl     REACTION: made her sleepy, ? swollen legs  . Lisinopril     REACTION: dizziness  . Prozac [Fluoxetine Hcl]     sleepy    Consultations:  GI    Procedures/Studies: Dg Chest Port 1 View  Result Date: 09/30/2016 CLINICAL DATA:  Dyspnea EXAM: PORTABLE CHEST 1 VIEW COMPARISON:  10/20/2009 FINDINGS: Cardiac shadow is mildly enlarged but accentuated by the portable technique. The lungs are clear bilaterally. No bony abnormality is seen. IMPRESSION: No active disease. Electronically Signed   By: Inez Catalina M.D.   On: 09/30/2016 14:36   Dg Foot 2 Views Right  Result Date: 09/15/2016 Please see detailed radiograph report in office note.      Subjective: No further bleeding, had BM today black stool, no blood in the stool.   Discharge Exam: Vitals:   10/04/16 2100 10/05/16 0515  BP: (!) 130/53 (!) 121/47  Pulse: 78 67  Resp: 20 18  Temp: 97.7 F (36.5 C) 98 F (36.7 C)  SpO2: 97% 100%   Vitals:   10/04/16 1312 10/04/16 2100 10/05/16 0500 10/05/16 0515  BP: (!) 142/62 (!) 130/53  (!) 121/47  Pulse: 74 78  67  Resp: 20 20  18   Temp: 98.4 F (36.9 C) 97.7 F (36.5 C)  98 F (36.7 C)  TempSrc: Oral Oral  Oral  SpO2: 99% 97%  100%  Weight:   113.1 kg (249 lb 5.4 oz)   Height:        General: Pt is alert, awake, not in acute distress Cardiovascular: RRR, S1/S2 +, no rubs, no gallops Respiratory: CTA bilaterally, no wheezing, no rhonchi Abdominal: Soft, NT, ND, bowel sounds + Extremities: no edema, no cyanosis    The results of significant diagnostics from this hospitalization (including imaging, microbiology, ancillary and laboratory) are listed below for reference.     Microbiology: No results found for this or any previous visit (from the past 240 hour(s)).   Labs: BNP (last 3 results) No results for input(s): BNP in the last 8760 hours. Basic Metabolic Panel:  Recent Labs Lab  09/30/16 1424 10/02/16 0405 10/02/16 2157 10/05/16 0417  NA 143 143 142 142  K 4.2 3.6 3.7 3.4*  CL 109 108 110 108  CO2 28 26 25 28   GLUCOSE 94 107* 123* 95  BUN 21* 12 16 11   CREATININE 0.73 0.71 0.88 0.75  CALCIUM 9.2 9.3 8.8* 8.7*   Liver Function Tests:  Recent Labs Lab 09/30/16 1424 10/02/16 2157  AST 20 25  ALT 16 17  ALKPHOS 83 68  BILITOT 0.4 0.5  PROT 6.9 6.5  ALBUMIN 4.0 3.6    Recent Labs Lab 09/30/16 1424  LIPASE 29   No results for input(s): AMMONIA in the last 168 hours. CBC:  Recent Labs Lab  09/30/16 1424  10/03/16 0859 10/03/16 1343 10/03/16 2118 10/04/16 0843 10/05/16 0417  WBC 8.1  < > 7.7 7.4 8.7 8.9 8.3  NEUTROABS 4.9  --   --   --   --   --   --   HGB 11.2*  < > 8.6* 8.6* 8.7* 8.6* 8.3*  HCT 34.3*  < > 25.9* 27.0* 26.3* 26.0* 24.9*  MCV 87.1  < > 86.9 86.3 87.4 87.5 87.4  PLT 231  < > 185 202 193 190 183  < > = values in this interval not displayed. Cardiac Enzymes: No results for input(s): CKTOTAL, CKMB, CKMBINDEX, TROPONINI in the last 168 hours. BNP: Invalid input(s): POCBNP CBG:  Recent Labs Lab 10/01/16 0553 10/01/16 1205 10/01/16 1712 10/02/16 0003 10/02/16 0540  GLUCAP 83 92 113* 100* 101*   D-Dimer No results for input(s): DDIMER in the last 72 hours. Hgb A1c No results for input(s): HGBA1C in the last 72 hours. Lipid Profile No results for input(s): CHOL, HDL, LDLCALC, TRIG, CHOLHDL, LDLDIRECT in the last 72 hours. Thyroid function studies No results for input(s): TSH, T4TOTAL, T3FREE, THYROIDAB in the last 72 hours.  Invalid input(s): FREET3 Anemia work up No results for input(s): VITAMINB12, FOLATE, FERRITIN, TIBC, IRON, RETICCTPCT in the last 72 hours. Urinalysis    Component Value Date/Time   COLORURINE AMBER BIOCHEMICALS MAY BE AFFECTED BY COLOR (A) 04/23/2010 1210   APPEARANCEUR CLOUDY (A) 04/23/2010 1210   LABSPEC 1.027 04/23/2010 1210   PHURINE 6.5 04/23/2010 1210   GLUCOSEU NEGATIVE 04/23/2010  1210   HGBUR NEGATIVE 04/23/2010 1210   BILIRUBINUR NEGATIVE 04/23/2010 1210   KETONESUR TRACE (A) 04/23/2010 1210   PROTEINUR NEGATIVE 04/23/2010 1210   UROBILINOGEN 0.2 04/23/2010 1210   NITRITE NEGATIVE 04/23/2010 1210   LEUKOCYTESUR SMALL (A) 04/23/2010 1210   Sepsis Labs Invalid input(s): PROCALCITONIN,  WBC,  LACTICIDVEN Microbiology No results found for this or any previous visit (from the past 240 hour(s)).   Time coordinating discharge: Over 30 minutes  SIGNED:   Elmarie Shiley, MD  [facility] 10/05/2016, 11:40 AM Pager   If 7PM-7AM, please contact night-coverage [email or web address] Password [email or web address]

## 2016-10-06 ENCOUNTER — Telehealth: Payer: Self-pay | Admitting: Family Medicine

## 2016-10-06 NOTE — Telephone Encounter (Signed)
Transition Care Management Follow-up Telephone Call  How have you been since you were released from the hospital? Patient stated she has felt well no hematochezia since discharge. "  Do you understand why you were in the hospital? Yes, the bleeding would not stop"   Do you understand the discharge instrcutions? yes  Items Reviewed:  Medications reviewed: yes  Allergies reviewed: yess  Dietary changes reviewed: yes, heart healthy .   Referrals reviewed: Yes   Functional Questionnaire:   Activities of Daily Living (ADLs):   She states they are independent in the following:Independent in ADLs States they require assistance with the following: No assist required.   Any transportation issues/concerns?: No   Any patient concerns? No   Confirmed importance and date/time of follow-up visits scheduled: Yes  Confirmed with patient if condition begins to worsen call PCP or go to the ER.  Patient was given the Call-a-Nurse line [phone]

## 2016-10-13 ENCOUNTER — Encounter: Payer: Self-pay | Admitting: Family Medicine

## 2016-10-13 ENCOUNTER — Ambulatory Visit: Payer: Medicare Other | Admitting: Family Medicine

## 2016-10-13 ENCOUNTER — Ambulatory Visit (INDEPENDENT_AMBULATORY_CARE_PROVIDER_SITE_OTHER): Payer: Medicare Other | Admitting: Family Medicine

## 2016-10-13 VITALS — BP 146/84 | HR 74 | Temp 98.6°F | Resp 20 | Wt 254.0 lb

## 2016-10-13 DIAGNOSIS — K922 Gastrointestinal hemorrhage, unspecified: Secondary | ICD-10-CM | POA: Diagnosis not present

## 2016-10-13 LAB — CBC
HCT: 29.9 % — ABNORMAL LOW (ref 36.0–46.0)
Hemoglobin: 9.5 g/dL — ABNORMAL LOW (ref 12.0–15.0)
MCHC: 31.9 g/dL (ref 30.0–36.0)
MCV: 89.1 fl (ref 78.0–100.0)
Platelets: 275 10*3/uL (ref 150.0–400.0)
RBC: 3.35 Mil/uL — ABNORMAL LOW (ref 3.87–5.11)
RDW: 15.6 % — ABNORMAL HIGH (ref 11.5–15.5)
WBC: 7.5 10*3/uL (ref 4.0–10.5)

## 2016-10-13 NOTE — Assessment & Plan Note (Signed)
Hospital course reviewed and summarized in history of present illness. Medications reviewed and reconciled today. She's had no further bleeding. Rechecking labs today.

## 2016-10-13 NOTE — Progress Notes (Signed)
Subjective:  Patient ID: Monica [name], female    DOB: [birth year]  Age: 81 y.o. MRN: [id number]  CC: Hospital follow up  HPI:  81 year old female with obesity, hypertension, hyperlipidemia presents for hospital follow-up.  Patient recently admitted from 8/12 - 8/15. Admission prior to that for the same issue, 8/10-8/12. Hospital course reviewed and summarized as follows:  Patient presented initially on 8/10 with rectal bleeding. GI was consult it and colonoscopy was done. Found to be secondary to diverticulosis. Hemoglobin stable. Discharged home in stable condition. Patient presented again on 8/12 after having 6-7 episodes of hematochezia. Was noted to have a drop in hemoglobin from 11-8.6. Patient was monitored closely during admission. GI was consult. She had no active bleeding and hemoglobin remained stable. She was discharged home in stable condition. Her losartan was held during admission. No other changes were made. She has follow-up with GI scheduled.  Patient presents today for follow-up. She states that she is doing well. She's had no further bleeding. No chest pain or shortness of breath. No reports of weakness or fatigue. She states that she struggles with her mood but has no other complaints or concerns at this time.  Social Hx   Social History   Social History  . Marital status: Widowed    Spouse name: N/A  . Number of children: 1  . Years of education: N/A   Occupational History  . Retired school principal Retired   Social History Main Topics  . Smoking status: Former Smoker    Packs/day: 0.50    Years: 20.00    Types: Cigarettes  . Smokeless tobacco: Never Used     Comment: quit approx. 20 years ago  . Alcohol use 0.0 oz/week     Comment: wine occasional  . Drug use: No  . Sexual activity: No   Other Topics Concern  . None   Social History Narrative   Widowed, 1 daughter. 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   Respiratory: Negative for shortness of breath.   Gastrointestinal: Negative for abdominal pain and blood in stool.   Objective:  BP (!) 146/84 (BP Location: Left Arm, Patient Position: Sitting, Cuff Size: Large)   Pulse 74   Temp 98.6 F (37 C) (Oral)   Resp 20   Wt 254 lb (115.2 kg)   SpO2 96%   BMI 39.78 kg/m   BP/Weight 10/13/2016 10/05/2016 06/09/6220  Systolic BP 979 892 -  Diastolic BP 84 47 -  Wt. (Lbs) 254 249.34 -  BMI 39.78 - 39.05    Physical Exam  Constitutional: She is oriented to person, place, and time. She appears well-developed. No distress.  Cardiovascular: Normal rate and regular rhythm.   No murmur heard. Pulmonary/Chest: Effort normal. She has no wheezes. She has no rales.  Abdominal:  Soft, nondistended, nontender. No rebound or guarding.  Neurological: She is alert and oriented to person, place, and time.  Vitals reviewed.  Lab Results  Component Value Date   WBC 8.3 10/05/2016   HGB 8.3 (L) 10/05/2016   HCT 24.9 (L) 10/05/2016   PLT 183 10/05/2016   GLUCOSE 95 10/05/2016   CHOL 192 04/15/2016   TRIG 90.0 04/15/2016   HDL 44.90 04/15/2016   LDLDIRECT 131.8 12/09/2009   LDLCALC 129 (H) 04/15/2016   ALT 17 10/02/2016   AST 25 10/02/2016   NA 142 10/05/2016  K 3.4 (L) 10/05/2016   CL 108 10/05/2016   CREATININE 0.75 10/05/2016   BUN 11 10/05/2016   CO2 28 10/05/2016   TSH 1.71 02/06/2015   INR 0.90 09/30/2016   HGBA1C 6.2 04/15/2016    Assessment & Plan:   Problem List Items Addressed This Visit    Lower GI bleed [facility] course reviewed and summarized in history of present illness. Medications reviewed and reconciled today. She's had no further bleeding. Rechecking labs today.      Relevant Orders   CBC     Follow-up: PRN  [place]

## 2016-10-13 NOTE — Patient Instructions (Signed)
Continue your medications.  Follow up in 6 months.   We will call with your results.  Take care  Dr. Lacinda Axon

## 2016-10-14 ENCOUNTER — Ambulatory Visit: Payer: Medicare Other | Admitting: Family Medicine

## 2016-10-14 ENCOUNTER — Telehealth: Payer: Self-pay | Admitting: *Deleted

## 2016-10-14 NOTE — Telephone Encounter (Signed)
Pt stated that she was prescribed losartan by Dr.Cook. Pt questioned if she should be taking this medication  Pt contact [phone]

## 2016-10-17 ENCOUNTER — Other Ambulatory Visit (INDEPENDENT_AMBULATORY_CARE_PROVIDER_SITE_OTHER): Payer: Medicare Other

## 2016-10-17 DIAGNOSIS — K922 Gastrointestinal hemorrhage, unspecified: Secondary | ICD-10-CM | POA: Diagnosis not present

## 2016-10-17 NOTE — Telephone Encounter (Signed)
Patient was last seen for hospital follow up on 10/13/16, Dr Lacinda Axon didn't mention discontinuing  losartan. Please advise.

## 2016-10-17 NOTE — Telephone Encounter (Signed)
It appears this was discontinued while she was in the hospital and not restarted at her follow-up. She should remain off of this for now. Please see if she has been checking her blood pressures at home. Thanks.

## 2016-10-18 LAB — IRON,TIBC AND FERRITIN PANEL
%SAT: 13 % (ref 11–50)
Ferritin: 39 ng/mL (ref 20–288)
Iron: 40 ug/dL — ABNORMAL LOW (ref 45–160)
TIBC: 310 ug/dL (ref 250–450)

## 2016-10-18 NOTE — Telephone Encounter (Signed)
Patient advised of below . She states she hasn't been monitoring blood pressure at home.  Patient will start monitoring blood pressure at home and let us know how it is running.

## 2016-10-18 NOTE — Telephone Encounter (Signed)
Noted  

## 2016-10-20 ENCOUNTER — Telehealth: Payer: Self-pay | Admitting: Family Medicine

## 2016-10-20 NOTE — Telephone Encounter (Signed)
Pt called back returning your call. Please advise, thank you!  Call pt @ [phone]

## 2016-10-20 NOTE — Telephone Encounter (Signed)
Patients call returned in regards to lab results  

## 2016-10-22 ENCOUNTER — Other Ambulatory Visit: Payer: Self-pay | Admitting: Family Medicine

## 2016-10-22 DIAGNOSIS — D649 Anemia, unspecified: Secondary | ICD-10-CM

## 2016-10-26 ENCOUNTER — Other Ambulatory Visit: Payer: Self-pay | Admitting: Family Medicine

## 2016-11-01 ENCOUNTER — Telehealth: Payer: Self-pay | Admitting: *Deleted

## 2016-11-01 NOTE — Telephone Encounter (Signed)
[facility] requested a medication verification for pantoprazole. Should pt continue this medication? If so a medication refill has been requested.

## 2016-11-01 NOTE — Telephone Encounter (Signed)
Pt called back and stated that she would like to go over all her medication and what she would should be taking and what she shouldn't. Please advise, thank you!  Call pt @ [phone]

## 2016-11-02 NOTE — Telephone Encounter (Signed)
Hold losartan. Continue protonix.

## 2016-11-02 NOTE — Telephone Encounter (Signed)
Patient advised of below and verbalized understanding.  

## 2016-11-02 NOTE — Telephone Encounter (Signed)
Patient calls and questions if she should be taking losartan and pantoprazole ? Looks like at visit should be on pantoprazole not losartan. Please clarify, thanks.

## 2016-11-03 MED ORDER — PANTOPRAZOLE SODIUM 40 MG PO TBEC: 40.0000 mg | DELAYED_RELEASE_TABLET | Freq: Every day | ORAL | 1 refills | Status: DC

## 2016-11-03 NOTE — Telephone Encounter (Signed)
Script sent, patient advised.  

## 2016-11-03 NOTE — Telephone Encounter (Signed)
Pt needs a refill on pantoprazole (PROTONIX) 40 MG tablet sent to [facility]. Please advise pt when rx is sent

## 2016-11-13 ENCOUNTER — Telehealth: Payer: Self-pay | Admitting: Internal Medicine

## 2016-11-13 NOTE — Telephone Encounter (Signed)
Received OV note in error  from GI Jyothi ann.  hgb on aug 30 at her office was 9.6 . Tried to abstract but "can't get there from here."  FYI , note wil be forwarded to you  Lab Results  Component Value Date   WBC 7.5 10/13/2016   HGB 9.5 (L) 10/13/2016   HCT 29.9 (L) 10/13/2016   MCV 89.1 10/13/2016   PLT 275.0 10/13/2016

## 2016-11-14 NOTE — Telephone Encounter (Signed)
Thanks for the FYI. I'll look for the note.

## 2016-11-16 ENCOUNTER — Ambulatory Visit: Payer: Medicare Other | Admitting: Podiatry

## 2016-11-21 ENCOUNTER — Other Ambulatory Visit (INDEPENDENT_AMBULATORY_CARE_PROVIDER_SITE_OTHER): Payer: Medicare Other

## 2016-11-21 DIAGNOSIS — D649 Anemia, unspecified: Secondary | ICD-10-CM

## 2016-11-21 LAB — CBC
HCT: 35.5 % (ref 35.0–45.0)
Hemoglobin: 11.3 g/dL — ABNORMAL LOW (ref 11.7–15.5)
MCH: 26.8 pg — ABNORMAL LOW (ref 27.0–33.0)
MCHC: 31.8 g/dL — ABNORMAL LOW (ref 32.0–36.0)
MCV: 84.1 fL (ref 80.0–100.0)
MPV: 12 fL (ref 7.5–12.5)
Platelets: 278 10*3/uL (ref 140–400)
RBC: 4.22 10*6/uL (ref 3.80–5.10)
RDW: 13.8 % (ref 11.0–15.0)
WBC: 7 10*3/uL (ref 3.8–10.8)

## 2016-11-21 LAB — IRON,TIBC AND FERRITIN PANEL
%SAT: 12 % (calc) (ref 11–50)
Ferritin: 19 ng/mL — ABNORMAL LOW (ref 20–288)
Iron: 39 ug/dL — ABNORMAL LOW (ref 45–160)
TIBC: 316 mcg/dL (calc) (ref 250–450)

## 2016-11-30 ENCOUNTER — Other Ambulatory Visit: Payer: Self-pay | Admitting: *Deleted

## 2016-11-30 MED ORDER — ALLOPURINOL 100 MG PO TABS: 200.0000 mg | ORAL_TABLET | Freq: Every day | ORAL | 0 refills | Status: DC

## 2016-11-30 NOTE — Telephone Encounter (Signed)
Refill done.  

## 2016-12-05 ENCOUNTER — Other Ambulatory Visit: Payer: Self-pay | Admitting: Family Medicine

## 2016-12-05 NOTE — Telephone Encounter (Signed)
Last OV was with Dr. Lacinda Axon on 10/13/2016, LAst refills in 10/26/16 and in April 2018, please advise, no upcoming appt. thanks

## 2016-12-05 NOTE — Telephone Encounter (Signed)
Please confirm dosing with patient. Please confirm she's been taking these daily. Please get her scheduled for follow-up. Thanks.

## 2016-12-12 ENCOUNTER — Emergency Department (HOSPITAL_COMMUNITY): Payer: Medicare Other

## 2016-12-12 ENCOUNTER — Encounter (HOSPITAL_COMMUNITY): Payer: Self-pay

## 2016-12-12 ENCOUNTER — Emergency Department (HOSPITAL_COMMUNITY)
Admission: EM | Admit: 2016-12-12 | Discharge: 2016-12-13 | Disposition: A | Discharge status: Home / Self Care | Payer: Medicare Other | Attending: Emergency Medicine | Admitting: Emergency Medicine

## 2016-12-12 DIAGNOSIS — Z87891 Personal history of nicotine dependence: Secondary | ICD-10-CM | POA: Insufficient documentation

## 2016-12-12 DIAGNOSIS — I10 Essential (primary) hypertension: Secondary | ICD-10-CM | POA: Insufficient documentation

## 2016-12-12 DIAGNOSIS — M79605 Pain in left leg: Secondary | ICD-10-CM | POA: Insufficient documentation

## 2016-12-12 DIAGNOSIS — R0602 Shortness of breath: Secondary | ICD-10-CM | POA: Diagnosis not present

## 2016-12-12 DIAGNOSIS — Z79899 Other long term (current) drug therapy: Secondary | ICD-10-CM | POA: Insufficient documentation

## 2016-12-12 HISTORY — DX: Gastrointestinal hemorrhage, unspecified: K92.2

## 2016-12-12 LAB — COMPREHENSIVE METABOLIC PANEL
ALT: 21 U/L (ref 14–54)
AST: 22 U/L (ref 15–41)
Albumin: 4.1 g/dL (ref 3.5–5.0)
Alkaline Phosphatase: 95 U/L (ref 38–126)
Anion gap: 10 (ref 5–15)
BUN: 11 mg/dL (ref 6–20)
CO2: 28 mmol/L (ref 22–32)
Calcium: 9.3 mg/dL (ref 8.9–10.3)
Chloride: 104 mmol/L (ref 101–111)
Creatinine, Ser: 0.67 mg/dL (ref 0.44–1.00)
GFR calc Af Amer: >60 mL/min (ref >60)
GFR calc non Af Amer: >60 mL/min (ref >60)
Glucose, Bld: 87 mg/dL (ref 65–99)
Potassium: 3.3 mmol/L — ABNORMAL LOW (ref 3.5–5.1)
Sodium: 142 mmol/L (ref 135–145)
Total Bilirubin: 0.4 mg/dL (ref 0.3–1.2)
Total Protein: 7.8 g/dL (ref 6.5–8.1)

## 2016-12-12 LAB — CBC WITH DIFFERENTIAL/PLATELET
Basophils Absolute: 0 10*3/uL (ref 0.0–0.1)
Basophils Relative: 0 %
Eosinophils Absolute: 0.2 10*3/uL (ref 0.0–0.7)
Eosinophils Relative: 2 %
HCT: 39.6 % (ref 36.0–46.0)
Hemoglobin: 12.5 g/dL (ref 12.0–15.0)
Lymphocytes Relative: 32 %
Lymphs Abs: 2.3 10*3/uL (ref 0.7–4.0)
MCH: 26.9 pg (ref 26.0–34.0)
MCHC: 31.6 g/dL (ref 30.0–36.0)
MCV: 85.3 fL (ref 78.0–100.0)
Monocytes Absolute: 0.5 10*3/uL (ref 0.1–1.0)
Monocytes Relative: 7 %
Neutro Abs: 4.3 10*3/uL (ref 1.7–7.7)
Neutrophils Relative %: 59 %
Platelets: 287 10*3/uL (ref 150–400)
RBC: 4.64 MIL/uL (ref 3.87–5.11)
RDW: 15.5 % (ref 11.5–15.5)
WBC: 7.4 10*3/uL (ref 4.0–10.5)

## 2016-12-12 LAB — D-DIMER, QUANTITATIVE: D-Dimer, Quant: 0.66 ug/mL-FEU — ABNORMAL HIGH (ref 0.00–0.50)

## 2016-12-12 LAB — LIPASE, BLOOD: Lipase: 29 U/L (ref 11–51)

## 2016-12-12 LAB — BRAIN NATRIURETIC PEPTIDE: B Natriuretic Peptide: 95.4 pg/mL (ref 0.0–100.0)

## 2016-12-12 LAB — TROPONIN I: Troponin I: <0.03 ng/mL (ref <0.03)

## 2016-12-12 MED ORDER — METHOCARBAMOL 500 MG PO TABS: 500.0000 mg | ORAL_TABLET | Freq: Two times a day (BID) | ORAL | 0 refills | Status: DC

## 2016-12-12 MED ORDER — METHOCARBAMOL 500 MG PO TABS
500.0000 mg | ORAL_TABLET | Freq: Once | ORAL | Status: AC
  Administered 2016-12-12: 500 mg via ORAL
  Filled 2016-12-12: qty 1

## 2016-12-12 MED ORDER — GI COCKTAIL ~~LOC~~
30.0000 mL | Freq: Once | ORAL | Status: AC
  Administered 2016-12-12: 30 mL via ORAL
  Filled 2016-12-12: qty 30

## 2016-12-12 NOTE — ED Notes (Signed)
Patient transported to X-ray 

## 2016-12-12 NOTE — ED Notes (Signed)
This RN attempted IV/blood draw x2 without success. Another RN to try.

## 2016-12-12 NOTE — ED Provider Notes (Addendum)
Emergency Department Provider Note   I have reviewed the triage vital signs and the nursing notes.   HISTORY  Chief Complaint Leg Pain   HPI Monica [name] is a 81 y.o. female with multiple complaints today but it seems like the most salient of which is that she has new left leg pain.  Patient states that hurts worse with movement to the point where now it she takes just a couple steps it hurts very severely in her left lateral thigh.  She has not had any falls or recent trauma.  She has not had any redness, fevers or other symptoms associated with it.  She does note that she has shortness of breath is a lot worse on exertion.  This is been going on for multiple months now and seems to be slowly getting worse but not much worse.  She has had a mild cough.  States she has been compliant with her medications.  She was recently admitted for anemia.  Does not noticed any dark stools or bright red blood in stools recently.  She does have some midline epigastric burning that feels like reflux goes from her epigastric area up about halfway through her chest retrosternally.  No other associated modifying symptoms.   Past Medical History:  Diagnosis Date  . Allergic rhinitis   . Anxiety   . Benign neoplasm of breast 2013  . Cardiomegaly   . Compulsive overeating   . Depression   . Diffuse cystic mastopathy 2013  . Edema   . GI bleeding   . Gout   . HTN (hypertension)   . Hyperlipidemia   . Lump or mass in breast 2012  . OA (osteoarthritis) of knee    with injections  . Obesity   . Other abnormal glucose   . Personal history of tobacco use, presenting hazards to health   . Sciatica   . Sinus infection 2010  . Unspecified sleep apnea     Patient Active Problem List   Diagnosis Date Noted  . Lower GI bleed 10/03/2016  . Diverticulosis of colon with hemorrhage   . Status post bilateral knee replacements 05/04/2016  . Chronic cough 04/15/2016  . Seborrheic keratoses 04/15/2016    . Cervical disc disorder with radiculopathy of cervical region 12/01/2015  . Partial nontraumatic rupture of right rotator cuff 09/22/2015  . Compulsive overeating 01/01/2015  . Chronic diastolic heart failure (HCC) 12/12/2012    Class: Chronic  . Urge incontinence of urine 09/09/2011  . Knee osteoarthritis 02/09/2010  . Gout 03/20/2008  . Hyperlipidemia 12/21/2006  . Depression with anxiety 12/21/2006  . Essential hypertension 12/21/2006  . Obstructive sleep apnea 12/21/2006    Past Surgical History:  Procedure Laterality Date  . adenosine cardiolite  1/08   low risk   . APPENDECTOMY    . BREAST LUMPECTOMY     right x1 ('89) left x2 ('70s, '90)  . CARDIAC CATHETERIZATION  2001   normal per pt  . COLONOSCOPY  11/02   diverticulosis  . COLONOSCOPY  9/06   diverticulosis, hemorhoids  . COLONOSCOPY N/A 10/01/2016   Procedure: COLONOSCOPY;  Surgeon: Gatha Mayer, MD;  Location: [facility];  Service: Endoscopy;  Laterality: N/A;  . dexa  3/02   normal  . EYE SURGERY  2012   cataract  . knee replacement Right 9/11   R. Dr. Telford Nab  . sleep study  5/05  . TONSILLECTOMY    . TOTAL ABDOMINAL HYSTERECTOMY     fibroids,  age of 54  . TOTAL KNEE ARTHROPLASTY Left 2012  . US TRANSVAGINAL PELVIC MODIFIED  2000, 2001    Current Outpatient Rx  . Order #: [id number] Class: Normal  . Order #: [id number] Class: Historical Med  . Order #: [id number] Class: Historical Med  . Order #: [id number] Class: Historical Med  . Order #: [id number] Class: Normal  . Order #: [id number] Class: Historical Med  . Order #: [id number] Class: Normal  . Order #: [id number] Class: Historical Med  . Order #: [id number] Class: Normal  . Order #: [id number] Class: Normal  . Order #: [id number] Class: Normal  . Order #: [id number] Class: Print    Allergies Buspirone hcl; Lisinopril; and Prozac [fluoxetine hcl]  Family History  Problem Relation Age of Onset  . Stroke Father   . Hypertension Mother        (a lot of  animosity in their relationship)  . Heart failure Mother   . Gout Unknown        whole family   . Prostate cancer Brother   . Hepatitis Brother        Hep C; alcoholism (terminal)  . Breast cancer Sister   . Other Brother        drug addiction    Social History Social History  Substance Use Topics  . Smoking status: Former Smoker    Packs/day: 0.50    Years: 20.00    Types: Cigarettes  . Smokeless tobacco: Never Used     Comment: quit approx. 20 years ago  . Alcohol use 0.0 oz/week     Comment: wine occasional    Review of Systems  All other systems negative except as documented in the HPI. All pertinent positives and negatives as reviewed in the HPI. ____________________________________________   PHYSICAL EXAM:  VITAL SIGNS: ED Triage Vitals  Enc Vitals Group     BP 12/12/16 1502 (!) 181/86     Pulse Rate 12/12/16 1502 68     Resp 12/12/16 1502 16     Temp 12/12/16 1502 98.1 F (36.7 C)     Temp Source 12/12/16 1502 Oral     SpO2 12/12/16 1457 99 %     Weight 12/12/16 1502 256 lb (116.1 kg)     Height 12/12/16 1502 5\' 7"  (1.702 m)    Constitutional: Alert and oriented. Well appearing and in no acute distress. Eyes: Conjunctivae are normal. PERRL. EOMI. Head: Atraumatic. Nose: No congestion/rhinnorhea. Mouth/Throat: Mucous membranes are moist.  Oropharynx non-erythematous. Neck: No stridor.  No meningeal signs.   Cardiovascular: Normal rate, regular rhythm. Good peripheral circulation. Grossly normal heart sounds.   Respiratory: Normal respiratory effort.  No retractions. Lungs CTAB. Gastrointestinal: Soft and nontender. No distention.  Musculoskeletal: No lower extremity tenderness nor edema. No gross deformities of extremities. Muscle spasm in lateral left leg on palpation. Neurologic:  Normal speech and language. No gross focal neurologic deficits are appreciated.  Skin:  Skin is warm, dry and intact. No rash  noted.  ____________________________________________   LABS (all labs ordered are listed, but only abnormal results are displayed)  Labs Reviewed  COMPREHENSIVE METABOLIC PANEL - Abnormal; Notable for the following:       Result Value   Potassium 3.3 (*)    All other components within normal limits  D-DIMER, QUANTITATIVE (NOT AT [facility]) - Abnormal; Notable for the following:    D-Dimer, Quant 0.66 (*)    All other components within normal limits  CBC WITH DIFFERENTIAL/PLATELET  TROPONIN I  LIPASE, BLOOD  BRAIN NATRIURETIC PEPTIDE   ____________________________________________  EKG   EKG Interpretation  Date/Time:  Monday December 12 2016 16:10:32 EDT Ventricular Rate:  69 PR Interval:    QRS Duration: 91 QT Interval:  434 QTC Calculation: 465 R Axis:   -9 Text Interpretation:  Sinus rhythm Abnormal R-wave progression, early transition Borderline T abnormalities, lateral leads No significant change since last tracing Confirmed by Merrily Pew [phone]) on 12/12/2016 5:27:11 PM       ____________________________________________  RADIOLOGY  Dg Chest 2 View  Result Date: 12/12/2016 CLINICAL DATA:  Evaluate for pneumonia or edema. EXAM: CHEST  2 VIEW COMPARISON:  Chest x-ray dated September 30, 2016. FINDINGS: Stable mild cardiomegaly. Normal pulmonary vascularity. No focal consolidation, pleural effusion, or pneumothorax. No acute osseous abnormality. IMPRESSION: Stable mild cardiomegaly.  No active cardiopulmonary disease. Electronically Signed   By: Titus Dubin M.D.   On: 12/12/2016 17:30   Dg Femur Min 2 Views Left  Result Date: 12/12/2016 CLINICAL DATA:  81 year old female with history of left-sided femoral pain for the past 3 weeks, or worsening significantly over the last 3 days. Most severe in the upper left femur laterally and posteriorly. EXAM: LEFT FEMUR 2 VIEWS COMPARISON:  No priors. FINDINGS: Four views of the left femur demonstrate no acute displaced fracture.  Postoperative changes of total knee arthroplasty are noted, without definite periprosthetic fracture or other acute complicating features. Left hip is located. Mild degenerative changes of osteoarthritis are noted in the left hip joint. IMPRESSION: 1. No acute radiographic abnormality of the left femur. 2. Mild left hip joint osteoarthritis. 3. Status post left total knee arthroplasty. Electronically Signed   By: Vinnie Langton M.D.   On: 12/12/2016 18:27    ____________________________________________   PROCEDURES  Procedure(s) performed:   Procedures   ____________________________________________   INITIAL IMPRESSION / ASSESSMENT AND PLAN / ED COURSE  Pertinent labs & imaging results that were available during my care of the patient were reviewed by me and considered in my medical decision making (see chart for details).  Symptoms improved with meds given here.  No e/o anemia, chf, acs as cause for her sob/weakness --> suspect deconditioning - recommended increased PT  But she was not very open to this idea.  D dimer less than cutoff for her age, doubt PE or DVT. No e/o lesion on xr to suspect neoplasm as cause for her left leg pain. Symptoms improved with robaxin, likely MSK. Will try heat, stretching and NSAIDs at home, otherwise follow up with PCP for further workupi f not improving.   ____________________________________________  FINAL CLINICAL IMPRESSION(S) / ED DIAGNOSES  Final diagnoses:  Left leg pain     MEDICATIONS GIVEN DURING THIS VISIT:  Medications  gi cocktail (Maalox,Lidocaine,Donnatal) (30 mLs Oral Given 12/12/16 1651)  methocarbamol (ROBAXIN) tablet 500 mg (500 mg Oral Given 12/12/16 1814)     NEW OUTPATIENT MEDICATIONS STARTED DURING THIS VISIT:  New Prescriptions   METHOCARBAMOL (ROBAXIN) 500 MG TABLET    Take 1 tablet (500 mg total) by mouth 2 (two) times daily.    Note:  This document was prepared using Dragon voice recognition software and  may include unintentional dictation errors.       Merrily Pew, MD 12/12/16 [phone]

## 2016-12-12 NOTE — ED Triage Notes (Signed)
Per EMS- Patient c/o left lateral thigh pain x 2-3 days. Pain 8/10. Pain worse with ambulation. Patient states she has had to start ambulating with a walker due to pain.

## 2016-12-16 NOTE — Telephone Encounter (Signed)
Left message to return call 

## 2017-01-03 ENCOUNTER — Other Ambulatory Visit: Payer: Self-pay | Admitting: Family Medicine

## 2017-01-07 ENCOUNTER — Other Ambulatory Visit: Payer: Self-pay | Admitting: Family Medicine

## 2017-01-10 NOTE — Telephone Encounter (Signed)
Patient is not longer a patient here

## 2017-02-06 ENCOUNTER — Other Ambulatory Visit: Payer: Self-pay | Admitting: Family Medicine

## 2017-02-27 ENCOUNTER — Encounter: Payer: Self-pay | Admitting: General Surgery

## 2017-02-28 ENCOUNTER — Other Ambulatory Visit: Payer: Self-pay | Admitting: Family Medicine

## 2017-03-01 ENCOUNTER — Other Ambulatory Visit: Payer: Self-pay | Admitting: Interventional Cardiology

## 2017-03-01 ENCOUNTER — Other Ambulatory Visit: Payer: Self-pay | Admitting: Family Medicine

## 2017-03-01 MED ORDER — AMLODIPINE BESYLATE 5 MG PO TABS: 5.0000 mg | ORAL_TABLET | Freq: Every day | ORAL | 1 refills | Status: DC

## 2017-03-02 ENCOUNTER — Encounter: Payer: Self-pay | Admitting: General Surgery

## 2017-03-02 ENCOUNTER — Ambulatory Visit: Payer: Medicare Other | Admitting: General Surgery

## 2017-03-02 VITALS — BP 138/68 | HR 88 | Ht 67.0 in | Wt 256.0 lb

## 2017-03-02 DIAGNOSIS — Z803 Family history of malignant neoplasm of breast: Secondary | ICD-10-CM | POA: Diagnosis not present

## 2017-03-02 NOTE — Patient Instructions (Addendum)
Patient will be asked to return to the office in one year with a bilateral screening mammogram. The patient is aware to call back for any questions or concerns. 

## 2017-03-02 NOTE — Progress Notes (Signed)
Patient ID: Monica [name], female   DOB: [birth year], 82 y.o.   MRN: [id number]  Chief Complaint  Patient presents with  . Follow-up    HPI Monica [name] is a 82 y.o. female who presents for a breast evaluation. The most recent mammogram was done on 02/24/2017.  Patient does perform regular self breast checks and gets regular mammograms done.    HPI  Past Medical History:  Diagnosis Date  . Allergic rhinitis   . Anxiety   . Benign neoplasm of breast 2013  . Cardiomegaly   . Compulsive overeating   . Depression   . Diffuse cystic mastopathy 2013  . Edema   . GI bleeding   . Gout   . HTN (hypertension)   . Hyperlipidemia   . Lump or mass in breast 2012  . OA (osteoarthritis) of knee    with injections  . Obesity   . Other abnormal glucose   . Personal history of tobacco use, presenting hazards to health   . Sciatica   . Sinus infection 2010  . Unspecified sleep apnea     Past Surgical History:  Procedure Laterality Date  . adenosine cardiolite  1/08   low risk   . APPENDECTOMY    . BREAST LUMPECTOMY     right x1 ('89) left x2 ('70s, '90)  . CARDIAC CATHETERIZATION  2001   normal per pt  . COLONOSCOPY  11/02   diverticulosis  . COLONOSCOPY  9/06   diverticulosis, hemorhoids  . COLONOSCOPY N/A 10/01/2016   Procedure: COLONOSCOPY;  Surgeon: Monica Mayer, MD;  Location: [facility];  Service: Endoscopy;  Laterality: N/A;  . dexa  3/02   normal  . EYE SURGERY  2012   cataract  . knee replacement Right 9/11   R. Dr. Telford Nab  . sleep study  5/05  . TONSILLECTOMY    . TOTAL ABDOMINAL HYSTERECTOMY     fibroids, age of 73  . TOTAL KNEE ARTHROPLASTY Left 2012  . US TRANSVAGINAL PELVIC MODIFIED  2000, 2001    Family History  Problem Relation Age of Onset  . Stroke Father   . Hypertension Mother        (a lot of animosity in their relationship)  . Heart failure Mother   . Gout Unknown        whole family   . Prostate cancer Brother   . Hepatitis  Brother        Hep C; alcoholism (terminal)  . Breast cancer Sister   . Other Brother        drug addiction    Social History Social History   Tobacco Use  . Smoking status: Former Smoker    Packs/day: 0.50    Years: 20.00    Pack years: 10.00    Types: Cigarettes  . Smokeless tobacco: Never Used  . Tobacco comment: quit approx. 20 years ago  Substance Use Topics  . Alcohol use: Yes    Alcohol/week: 0.0 oz    Comment: wine occasional  . Drug use: No    Allergies  Allergen Reactions  . Buspirone Hcl     REACTION: made her sleepy, ? swollen legs  . Lisinopril     REACTION: dizziness  . Prozac [Fluoxetine Hcl]     sleepy    Current Outpatient Medications  Medication Sig Dispense Refill  . ACCU-CHEK AVIVA PLUS test strip CHECK GLUCOSE WITH EACH MEAL AND AT BEDTIME AS DIRECTED 100 each 3  . ACCU-CHEK SOFTCLIX LANCETS  lancets Check blood sugar once daily and as needed. Dx R73.01 100 each 3  . allopurinol (ZYLOPRIM) 100 MG tablet TAKE 2 TABLETS BY MOUTH ONCE A DAY *NEED OFFICE VISIT 60 tablet 0  . amLODipine (NORVASC) 5 MG tablet Take 1 tablet (5 mg total) by mouth daily. Please make yearly appt with Dr. Tamala Julian for July before anymore refills. 1st attempt 90 tablet 1  . calcium carbonate (OS-CAL) 600 MG TABS Take 600 mg by mouth daily.     . Cholecalciferol (VITAMIN D3) 2000 units capsule Take 2,000 Units by mouth daily.    . furosemide (LASIX) 40 MG tablet TAKE 1 TABLET BY MOUTH DAILY 30 tablet 1  . MAGNESIUM CARBONATE PO Take 250 mg by mouth daily.    . metoprolol succinate (TOPROL-XL) 100 MG 24 hr tablet TAKE 1 TABLET BY MOUTH DAILY WITH OR IMMEDIATLY FOLLOWING A MEAL 30 tablet 5  . Omega-3 Fatty Acids (FISH OIL PO) Take 500 capsules by mouth daily.     . potassium chloride SA (K-DUR,KLOR-CON) 20 MEQ tablet Take 1 tablet (20 mEq total) by mouth daily. 30 tablet 11   Current Facility-Administered Medications  Medication Dose Route Frequency Provider Last Rate Last Dose  .  triamcinolone acetonide (KENALOG) 10 MG/ML injection 10 mg  10 mg Other Once Monica [name], DPM        Review of Systems Review of Systems  Constitutional: Negative.   Respiratory: Negative.   Cardiovascular: Negative.     Blood pressure 138/68, pulse 88, height 5\' 7"  (1.702 m), weight 256 lb (116.1 kg).  Physical Exam Physical Exam  Constitutional: She is oriented to person, place, and time. She appears well-developed and well-nourished.  Eyes: Conjunctivae are normal. No scleral icterus.  Neck: Neck supple.  Cardiovascular: Normal rate, regular rhythm and normal heart sounds.  Pulmonary/Chest: Effort normal and breath sounds normal. Right breast exhibits no inverted nipple, no mass, no nipple discharge, no skin change and no tenderness. Left breast exhibits no inverted nipple, no mass, no nipple discharge, no skin change and no tenderness.  Lymphadenopathy:    She has no cervical adenopathy.  Neurological: She is alert and oriented to person, place, and time.  Skin: Skin is warm and dry.    Data Reviewed Bilateral screening mammograms completed February 24, 2017 at [facility] were reviewed.  BI-RADS-II.  Assessment    Unremarkable breast exam.  Strong family history of breast cancer.    Plan        Patient will be asked to return to the office in one year with a bilateral screening mammogram. The patient is aware to call back for any questions or concerns.  HPI, Physical Exam, Assessment and Plan have been scribed under the direction and in the presence of Monica Ard, MD.  Monica [name], CMA   Monica [name] Monica [name] 03/03/2017, 8:44 AM

## 2017-03-03 ENCOUNTER — Encounter: Payer: Self-pay | Admitting: General Surgery

## 2017-06-28 ENCOUNTER — Other Ambulatory Visit: Payer: Self-pay | Admitting: Interventional Cardiology

## 2017-07-25 ENCOUNTER — Other Ambulatory Visit: Payer: Self-pay | Admitting: Interventional Cardiology

## 2017-07-29 NOTE — Progress Notes (Signed)
[name] [address], [place] [address] Phone: [phone] Subjective:     CC: Leg pain  [id number]  Monica [name] is a 82 y.o. female coming in with complaint of bilateral leg pain. Left is worse. Numbness and tingling in the toes. Takes her hours until she can stand up straight. Using the right leg to get her up on to the leg causing stress to the left leg. Has had a left knee replacement about 6 years ago. Left leg was "bowed". States she feels like her leg wants to rotate laterally. She feels a pull when she tries to prevent lateral rotation. Feet swollen bilaterally. Also wants to get labs for diabetes.  Onset- Chronic Location- Leg pain, butt, hip Duration- First thing in the morning is worse  Character- Tight Aggravating factors- Walking, getting in and out of bed  Reliving factors- Ice helps some  Therapies tried- Meloxicam has not been doing anything, takes 3 aleve, creams, CBD oil  Severity-sometimes 9 out of 10 because it stops her from activity.   Patient did have x-rays of the femur last time she was in the emergency department.  These were independently visualized by me.  Patient did have some mild hip arthritis of the left side.  Past Medical History:  Diagnosis Date  . Allergic rhinitis   . Anxiety   . Benign neoplasm of breast 2013  . Cardiomegaly   . Compulsive overeating   . Depression   . Diffuse cystic mastopathy 2013  . Edema   . GI bleeding   . Gout   . HTN (hypertension)   . Hyperlipidemia   . Lump or mass in breast 2012  . OA (osteoarthritis) of knee    with injections  . Obesity   . Other abnormal glucose   . Personal history of tobacco use, presenting hazards to health   . Sciatica   . Sinus infection 2010  . Unspecified sleep apnea    Past Surgical History:  Procedure Laterality Date  . adenosine cardiolite  1/08   low risk   . APPENDECTOMY    . BREAST LUMPECTOMY     right x1 ('89) left  x2 ('70s, '90)  . CARDIAC CATHETERIZATION  2001   normal per pt  . COLONOSCOPY  11/02   diverticulosis  . COLONOSCOPY  9/06   diverticulosis, hemorhoids  . COLONOSCOPY N/A 10/01/2016   Procedure: COLONOSCOPY;  Surgeon: Gatha Mayer, MD;  Location: [facility];  Service: Endoscopy;  Laterality: N/A;  . dexa  3/02   normal  . EYE SURGERY  2012   cataract  . knee replacement Right 9/11   R. Dr. Telford Nab  . sleep study  5/05  . TONSILLECTOMY    . TOTAL ABDOMINAL HYSTERECTOMY     fibroids, age of 47  . TOTAL KNEE ARTHROPLASTY Left 2012  . US TRANSVAGINAL PELVIC MODIFIED  2000, 2001   Social History   Socioeconomic History  . Marital status: Widowed    Spouse name: Not on file  . Number of children: 1  . Years of education: Not on file  . Highest education level: Not on file  Occupational History  . Occupation: Retired Ship broker: RETIRED  Social Needs  . Financial resource strain: Not on file  . Food insecurity:    Worry: Not on file    Inability: Not on file  . Transportation needs:    Medical: Not on file  Non-medical: Not on file  Tobacco Use  . Smoking status: Former Smoker    Packs/day: 0.50    Years: 20.00    Pack years: 10.00    Types: Cigarettes  . Smokeless tobacco: Never Used  . Tobacco comment: quit approx. 20 years ago  Substance and Sexual Activity  . Alcohol use: Yes    Alcohol/week: 0.0 oz    Comment: wine occasional  . Drug use: No  . Sexual activity: Never  Lifestyle  . Physical activity:    Days per week: Not on file    Minutes per session: Not on file  . Stress: Not on file  Relationships  . Social connections:    Talks on phone: Not on file    Gets together: Not on file    Attends religious service: Not on file    Active member of club or organization: Not on file    Attends meetings of clubs or organizations: Not on file    Relationship status: Not on file  Other Topics Concern  . Not on file  Social History  Narrative   Widowed, 1 daughter. 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 Buspirone Hcl     REACTION: made her sleepy, ? swollen legs  . Lisinopril     REACTION: dizziness  . Prozac [Fluoxetine Hcl]     sleepy   Family History  Problem Relation Age of Onset  . Stroke Father   . Hypertension Mother        (a lot of animosity in their relationship)  . Heart failure Mother   . Gout Unknown        whole family   . Prostate cancer Brother   . Hepatitis Brother        Hep C; alcoholism (terminal)  . Breast cancer Sister   . Other Brother        drug addiction     Past medical history, social, surgical and family history all reviewed in electronic medical record.  No pertanent information unless stated regarding to the chief complaint.   Review of Systems:Review of systems updated and as accurate as of 07/31/17  No headache, visual changes, nausea, vomiting, diarrhea, constipation, dizziness, abdominal pain, skin rash, fevers, chills, night sweats, weight loss, swollen lymph nodes,joint swelling,s, chest pain, shortness of breath, mood changes.  Positive muscle aches, body aches  Objective  Blood pressure 140/84, pulse 68, height 5\' 7"  (1.702 m), weight 247 lb (112 kg), SpO2 96 %. Systems examined below as of 07/31/17   General: No apparent distress alert and oriented x3 mood and affect normal, dressed appropriately.  HEENT: Pupils equal, extraocular movements intact  Respiratory: Patient's speak in full sentences and does not appear short of breath  Cardiovascular: No lower extremity edema, non tender, no erythema  Skin: Warm dry intact with no signs of infection or rash on extremities or on axial skeleton.  Abdomen: Soft nontender  Neuro: Cranial nerves II through XII are intact, neurovascularly intact in all extremities with 2+ DTRs and 2+ pulses.  Lymph: No lymphadenopathy of posterior or anterior cervical chain  or axillae bilaterally.  Gait n antalgic using the aid of a cane MSK:  Non tender with full range of motion and good stability and symmetric strength and tone of shoulders, elbows, wrist, , and ankles bilaterally.  Arthritic changes of multiple joints Left knee exam does show  a replacement.  No swelling noted.  Seems to be well-healed with near full range of motion. Left hip exam shows some mild tenderness over the greater trochanteric area and lacks the last 5 to 10 degrees of internal rotation.  Patient though does have what appears to be a positive straight leg test with radicular symptoms in the L5 distribution.  4+ out of 5 strength in the left lower extremity compared to the contralateral side but does have weakness with 3 out of 5 dorsiflexion of the left foot.   97110; 15 additional minutes spent for Therapeutic exercises as stated in above notes.  This included exercises focusing on stretching, strengthening, with significant focus on eccentric aspects.   Long term goals include an improvement in range of motion, strength, endurance as well as avoiding reinjury. Patient's frequency would include in 1-2 times a day, 3-5 times a week for a duration of 6-12 weeks. 97110; 15 additional minutes spent for Therapeutic exercises as stated in above notes.  This included exercises focusing on stretching, strengthening, with significant focus on eccentric aspects.   Long term goals include an improvement in range of motion, strength, endurance as well as avoiding reinjury. Patient's frequency would include in 1-2 times a day, 3-5 times a week for a duration of 6-12 weeks.  Proper technique shown and discussed handout in great detail with ATC.  All questions were discussed and answered.   Proper technique shown and discussed handout in great detail with ATC.  All questions were discussed and answered.     Impression and Recommendations:     This case required medical decision making of moderate  complexity.      Note: This dictation was prepared with Dragon dictation along with smaller phrase technology. Any transcriptional errors that result from this process are unintentional.

## 2017-07-31 ENCOUNTER — Encounter: Payer: Self-pay | Admitting: Family Medicine

## 2017-07-31 ENCOUNTER — Ambulatory Visit (INDEPENDENT_AMBULATORY_CARE_PROVIDER_SITE_OTHER)
Admission: RE | Admit: 2017-07-31 | Discharge: 2017-07-31 | Disposition: A | Discharge status: Home / Self Care | Payer: Medicare Other | Source: Ambulatory Visit | Attending: Family Medicine | Admitting: Family Medicine

## 2017-07-31 ENCOUNTER — Ambulatory Visit: Payer: Medicare Other | Admitting: Family Medicine

## 2017-07-31 VITALS — BP 140/84 | HR 68 | Ht 67.0 in | Wt 247.0 lb

## 2017-07-31 DIAGNOSIS — G8929 Other chronic pain: Secondary | ICD-10-CM

## 2017-07-31 DIAGNOSIS — M5416 Radiculopathy, lumbar region: Secondary | ICD-10-CM | POA: Insufficient documentation

## 2017-07-31 DIAGNOSIS — M549 Dorsalgia, unspecified: Secondary | ICD-10-CM | POA: Diagnosis not present

## 2017-07-31 MED ORDER — GABAPENTIN 100 MG PO CAPS: 200.0000 mg | ORAL_CAPSULE | Freq: Every day | ORAL | 3 refills | Status: DC

## 2017-07-31 NOTE — Patient Instructions (Addendum)
Good to see you.  Ice 20 minutes 2 times daily. Usually after activity and before bed. pennsaid pinkie amount topically 2 times daily as needed.  Xrays downstairs Gabapentin 200mg  at night Over the counter get  B12 1078mcg daily  B6 100mg  daily  Tart cherry extract any dose at night and in pill form  See me again in 2-4 weeks and we will make sure you are better

## 2017-07-31 NOTE — Assessment & Plan Note (Signed)
Could be more than secondary to lumbar radiculopathy.  Start gabapentin, home exercises, but this could be a possible side effect to the knee replacement as well and will need to monitor.  Continue the gout medications.  Discussed over-the-counter medications.  Follow-up again in 2 to 3 weeks.  X-rays pending

## 2017-08-19 NOTE — Progress Notes (Signed)
[name] [address] [place], [place] [address] Phone: [phone] Subjective:    I'm seeing this patient by the request  of:    CC: Back and leg pain follow-up  HDQ:[id number]  Monica [name] is a 82 y.o. female coming in with complaint of leg pain, left greater than right. Pain is same intensity as last visit. She has a lot of pain she states more than leg numbness. Has been seeing a Restaurant manager, fast food. Uses laser on legs.  Patient has not noticed any improvement with this.  States that she continues to have an aching sensation in the legs bilaterally.  Seems worse with activity.  Last exam patient did have x-rays.  X-rays were independently visualized by me showing significant degenerative disc disease at multiple levels most pronounced at L4-L5     Past Medical History:  Diagnosis Date  . Allergic rhinitis   . Anxiety   . Benign neoplasm of breast 2013  . Cardiomegaly   . Compulsive overeating   . Depression   . Diffuse cystic mastopathy 2013  . Edema   . GI bleeding   . Gout   . HTN (hypertension)   . Hyperlipidemia   . Lump or mass in breast 2012  . OA (osteoarthritis) of knee    with injections  . Obesity   . Other abnormal glucose   . Personal history of tobacco use, presenting hazards to health   . Sciatica   . Sinus infection 2010  . Unspecified sleep apnea    Past Surgical History:  Procedure Laterality Date  . adenosine cardiolite  1/08   low risk   . APPENDECTOMY    . BREAST LUMPECTOMY     right x1 ('89) left x2 ('70s, '90)  . CARDIAC CATHETERIZATION  2001   normal per pt  . COLONOSCOPY  11/02   diverticulosis  . COLONOSCOPY  9/06   diverticulosis, hemorhoids  . COLONOSCOPY N/A 10/01/2016   Procedure: COLONOSCOPY;  Surgeon: Gatha Mayer, MD;  Location: [facility];  Service: Endoscopy;  Laterality: N/A;  . dexa  3/02   normal  . EYE SURGERY  2012   cataract  . knee replacement Right 9/11   R. Dr. Telford Nab  .  sleep study  5/05  . TONSILLECTOMY    . TOTAL ABDOMINAL HYSTERECTOMY     fibroids, age of 54  . TOTAL KNEE ARTHROPLASTY Left 2012  . US TRANSVAGINAL PELVIC MODIFIED  2000, 2001   Social History   Socioeconomic History  . Marital status: Widowed    Spouse name: Not on file  . Number of children: 1  . Years of education: Not on file  . Highest education level: Not on file  Occupational History  . Occupation: Retired Ship broker: RETIRED  Social Needs  . Financial resource strain: Not on file  . Food insecurity:    Worry: Not on file    Inability: Not on file  . Transportation needs:    Medical: Not on file    Non-medical: Not on file  Tobacco Use  . Smoking status: Former Smoker    Packs/day: 0.50    Years: 20.00    Pack years: 10.00    Types: Cigarettes  . Smokeless tobacco: Never Used  . Tobacco comment: quit approx. 20 years ago  Substance and Sexual Activity  . Alcohol use: Yes    Alcohol/week: 0.0 oz    Comment: wine occasional  . Drug  use: No  . Sexual activity: Never  Lifestyle  . Physical activity:    Days per week: Not on file    Minutes per session: Not on file  . Stress: Not on file  Relationships  . Social connections:    Talks on phone: Not on file    Gets together: Not on file    Attends religious service: Not on file    Active member of club or organization: Not on file    Attends meetings of clubs or organizations: Not on file    Relationship status: Not on file  Other Topics Concern  . 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 Buspirone Hcl     REACTION: made her sleepy, ? swollen legs  . Lisinopril     REACTION: dizziness  . Prozac [Fluoxetine Hcl]     sleepy   Family History  Problem Relation Age of Onset  . Stroke Father   . Hypertension Mother        (a lot of animosity in their  relationship)  . Heart failure Mother   . Gout Unknown        whole family   . Prostate cancer Brother   . Hepatitis Brother        Hep C; alcoholism (terminal)  . Breast cancer Sister   . Other Brother        drug addiction     Past medical history, social, surgical and family history all reviewed in electronic medical record.  No pertanent information unless stated regarding to the chief complaint.   Review of Systems:Review of systems updated and as accurate as of 08/21/17  No headache, visual changes, nausea, vomiting, diarrhea, constipation, dizziness, abdominal pain, skin rash, fevers, chills, night sweats, weight loss, swollen lymph nodes, body aches, joint swelling,  chest pain, shortness of breath, mood changes.  Positive muscle aches  Objective  Blood pressure 118/74, pulse 71, height 5\' 7"  (1.702 m), weight 245 lb (111.1 kg), SpO2 95 %. Systems examined below as of 08/21/17   General: No apparent distress alert and oriented x3 mood and affect normal, dressed appropriately.  HEENT: Pupils equal, extraocular movements intact  Respiratory: Patient's speak in full sentences and does not appear short of breath  Cardiovascular: No lower extremity edema, non tender, no erythema  Skin: Warm dry intact with no signs of infection or rash on extremities or on axial skeleton.  Abdomen: Soft nontender  Neuro: Cranial nerves II through XII are intact, neurovascularly intact in all extremities with  and 2+ pulses.  Lymph: No lymphadenopathy of posterior or anterior cervical chain or axillae bilaterally.  Gait antalgic walking with the aid of a cane MSK:  tender with limited range of motion but good stability and symmetric strength and tone of shoulders, elbows, wrist, hip, knee and ankles bilaterally.  Patient's legs did have 4-5 strength.  Patient's back does have severe tenderness diffusely in the paraspinal musculature.  Significant tightness with straight leg test bilaterally and  unable to do [place] test secondary to tightness and pain.  Deep tendon reflexes seem 1+ bilateral patella and Achilles    Impression and Recommendations:     This case required medical decision making of moderate complexity.      Note: This dictation was prepared with Dragon dictation along with smaller phrase  technology. Any transcriptional errors that result from this process are unintentional.

## 2017-08-21 ENCOUNTER — Encounter: Payer: Self-pay | Admitting: Family Medicine

## 2017-08-21 ENCOUNTER — Ambulatory Visit: Payer: Medicare Other | Admitting: Family Medicine

## 2017-08-21 ENCOUNTER — Other Ambulatory Visit: Payer: Self-pay

## 2017-08-21 DIAGNOSIS — M5416 Radiculopathy, lumbar region: Secondary | ICD-10-CM | POA: Diagnosis not present

## 2017-08-21 MED ORDER — VITAMIN D (ERGOCALCIFEROL) 1.25 MG (50000 UNIT) PO CAPS: 50000.0000 [IU] | ORAL_CAPSULE | ORAL | 0 refills | Status: DC

## 2017-08-21 MED ORDER — NABUMETONE 500 MG PO TABS: 500.0000 mg | ORAL_TABLET | Freq: Every day | ORAL | 1 refills | Status: DC

## 2017-08-21 NOTE — Assessment & Plan Note (Signed)
Low back pain with radicular symptoms.  We discussed the possibility of an MRI at this point secondary to more spinal stenosis.  We discussed an MRI at this point and likely epidurals.  Patient is not on a blood thinner.  Discussed icing regimen.  Patient will be able to try a different anti-inflammatory warned of potential side effects.  Follow-up again in 4 weeks after potential epidural.  Spent  25 minutes with patient face-to-face and had greater than 50% of counseling including as described above in assessment and plan.

## 2017-08-21 NOTE — Patient Instructions (Signed)
Good to see you  Relafen 1 time daily stop the ibuprofen  Once weekly vitamin D sent in  We will get MRI of the back as well  If it is what we think then I would like you to have an epidural  See me again then 3 weeks AFTER the injection  Beauty rest black , foster and son, and Wynonia Sours We will be talking

## 2017-08-22 ENCOUNTER — Other Ambulatory Visit: Payer: Self-pay | Admitting: Family Medicine

## 2017-08-22 NOTE — Telephone Encounter (Signed)
Refill done.  

## 2017-08-30 ENCOUNTER — Ambulatory Visit
Admission: RE | Admit: 2017-08-30 | Discharge: 2017-08-30 | Disposition: A | Discharge status: Home / Self Care | Payer: Medicare Other | Source: Ambulatory Visit | Attending: Family Medicine | Admitting: Family Medicine

## 2017-08-30 DIAGNOSIS — M5416 Radiculopathy, lumbar region: Secondary | ICD-10-CM

## 2017-09-18 NOTE — Progress Notes (Signed)
[name] [address] [place], [place] [address] Phone: [phone] Subjective:     CC: Back pain follow-up  OEV:[id number]  Monica [name] is a 82 y.o. female coming in with complaint of back pain. States that her left leg is in pain today. At its worse this morning.  Patient was having worsening pain going down and radiating.  Was not responding to conservative therapy.  Sent for an MRI.  MRI independently visualized by me showing severe spinal stenosis at L3-L4.  Patient states that if anything the pain seems to be worsening.  Not having significant improvement with the medications we have been taking.  Rates the severity of pain is 9 out of 10.  Affecting daily activities as well as sleep.  Unable to walk greater than 100 feet without having to stop and severe amount of pain.     Past Medical History:  Diagnosis Date  . Allergic rhinitis   . Anxiety   . Benign neoplasm of breast 2013  . Cardiomegaly   . Compulsive overeating   . Depression   . Diffuse cystic mastopathy 2013  . Edema   . GI bleeding   . Gout   . HTN (hypertension)   . Hyperlipidemia   . Lump or mass in breast 2012  . OA (osteoarthritis) of knee    with injections  . Obesity   . Other abnormal glucose   . Personal history of tobacco use, presenting hazards to health   . Sciatica   . Sinus infection 2010  . Unspecified sleep apnea    Past Surgical History:  Procedure Laterality Date  . adenosine cardiolite  1/08   low risk   . APPENDECTOMY    . BREAST LUMPECTOMY     right x1 ('89) left x2 ('70s, '90)  . CARDIAC CATHETERIZATION  2001   normal per pt  . COLONOSCOPY  11/02   diverticulosis  . COLONOSCOPY  9/06   diverticulosis, hemorhoids  . COLONOSCOPY N/A 10/01/2016   Procedure: COLONOSCOPY;  Surgeon: Gatha Mayer, MD;  Location: [facility];  Service: Endoscopy;  Laterality: N/A;  . dexa  3/02   normal  . EYE SURGERY  2012   cataract  . knee replacement  Right 9/11   R. Dr. Telford Nab  . sleep study  5/05  . TONSILLECTOMY    . TOTAL ABDOMINAL HYSTERECTOMY     fibroids, age of 22  . TOTAL KNEE ARTHROPLASTY Left 2012  . US TRANSVAGINAL PELVIC MODIFIED  2000, 2001   Social History   Socioeconomic History  . Marital status: Widowed    Spouse name: Not on file  . Number of children: 1  . Years of education: Not on file  . Highest education level: Not on file  Occupational History  . Occupation: Retired Ship broker: RETIRED  Social Needs  . Financial resource strain: Not on file  . Food insecurity:    Worry: Not on file    Inability: Not on file  . Transportation needs:    Medical: Not on file    Non-medical: Not on file  Tobacco Use  . Smoking status: Former Smoker    Packs/day: 0.50    Years: 20.00    Pack years: 10.00    Types: Cigarettes  . Smokeless tobacco: Never Used  . Tobacco comment: quit approx. 20 years ago  Substance and Sexual Activity  . Alcohol use: Yes    Alcohol/week: 0.0 oz  Comment: wine occasional  . Drug use: No  . Sexual activity: Never  Lifestyle  . Physical activity:    Days per week: Not on file    Minutes per session: Not on file  . Stress: Not on file  Relationships  . Social connections:    Talks on phone: Not on file    Gets together: Not on file    Attends religious service: Not on file    Active member of club or organization: Not on file    Attends meetings of clubs or organizations: Not on file    Relationship status: Not on file  Other Topics Concern  . 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 Buspirone Hcl     REACTION: made her sleepy, ? swollen legs  . Lisinopril     REACTION: dizziness  . Prozac [Fluoxetine Hcl]     sleepy   Family History  Problem Relation Age of Onset  . Stroke Father   . Hypertension Mother        (a lot  of animosity in their relationship)  . Heart failure Mother   . Gout Unknown        whole family   . Prostate cancer Brother   . Hepatitis Brother        Hep C; alcoholism (terminal)  . Breast cancer Sister   . Other Brother        drug addiction     Past medical history, social, surgical and family history all reviewed in electronic medical record.  No pertanent information unless stated regarding to the chief complaint.   Review of Systems:Review of systems updated and as accurate as of 09/20/17  No headache, visual changes, nausea, vomiting, diarrhea, constipation, dizziness, abdominal pain, skin rash, fevers, chills, night sweats, weight loss, swollen lymph nodes, sitive muscle aches, body aches, joint swelling es, chest pain, shortness of breath, mood changes.   Objective  Blood pressure (!) 150/90, pulse 69, height 5\' 7"  (1.702 m), weight 240 lb (108.9 kg), SpO2 97 %. Systems examined below as of 09/20/17   General: No apparent distress alert and oriented x3 mood and affect normal, dressed appropriately.  HEENT: Pupils equal, extraocular movements intact  Respiratory: Patient's speak in full sentences and does not appear short of breath  Cardiovascular: No lower extremity edema, non tender, no erythema  Skin: Warm dry intact with no signs of infection or rash on extremities or on axial skeleton.  Abdomen: Soft nontender  Neuro: Cranial nerves II through XII are intact, neurovascularly intact in all extremities with 2+ DTRs and 2+ pulses.  Lymph: No lymphadenopathy of posterior or anterior cervical chain or axillae bilaterally.  Gait antalgic with the walking with the aid of a cane MSK:  tender with limited range of motion and mild instability and symmetric strength and tone of shoulders, elbows, wrist, h bilaterally.  Neck has significant loss of lordosis and degenerative scoliosis.  Patient does have severe tenderness to palpation in the paraspinal musculature of the lumbar  spine from L3-S1 bilaterally.  Unable to do [facility] test secondary to tightness and pain.  Patient does have severe pain over the greater trochanteric area bilaterally.  Unable to do straight leg test secondary to pain and tightness greater than 25 degrees flexion.     Impression and Recommendations:  This case required medical decision making of moderate complexity.      Note: This dictation was prepared with Dragon dictation along with smaller phrase technology. Any transcriptional errors that result from this process are unintentional.

## 2017-09-20 ENCOUNTER — Ambulatory Visit: Payer: Medicare Other | Admitting: Family Medicine

## 2017-09-20 ENCOUNTER — Encounter: Payer: Self-pay | Admitting: Family Medicine

## 2017-09-20 VITALS — BP 150/90 | HR 69 | Ht 67.0 in | Wt 240.0 lb

## 2017-09-20 DIAGNOSIS — M48061 Spinal stenosis, lumbar region without neurogenic claudication: Secondary | ICD-10-CM | POA: Insufficient documentation

## 2017-09-20 DIAGNOSIS — G8929 Other chronic pain: Secondary | ICD-10-CM | POA: Diagnosis not present

## 2017-09-20 DIAGNOSIS — M549 Dorsalgia, unspecified: Secondary | ICD-10-CM | POA: Diagnosis not present

## 2017-09-20 HISTORY — DX: Spinal stenosis, lumbar region without neurogenic claudication: M48.061

## 2017-09-20 NOTE — Patient Instructions (Signed)
Good to see you  Epidural will be orderd and call [phone] to schedule.  Ice is your friend  Hold on chiropracter for now.  Printed off the MRI for you  See me again 3 weeks AFTER epidural

## 2017-09-20 NOTE — Assessment & Plan Note (Signed)
Severe overall.  No significant neurogenic claudication.  Patient is going to be scheduled for an epidural.  Discussed at great time.  Discussed icing regimen topical anti-inflammatories.  We discussed following up 3 weeks after the injection to discuss  Spent  25 minutes with patient face-to-face and had greater than 50% of counseling including as described above in assessment and plan.

## 2017-10-06 ENCOUNTER — Ambulatory Visit
Admission: RE | Admit: 2017-10-06 | Discharge: 2017-10-06 | Disposition: A | Discharge status: Home / Self Care | Payer: Medicare Other | Source: Ambulatory Visit | Attending: Family Medicine | Admitting: Family Medicine

## 2017-10-06 DIAGNOSIS — M549 Dorsalgia, unspecified: Principal | ICD-10-CM

## 2017-10-06 DIAGNOSIS — G8929 Other chronic pain: Secondary | ICD-10-CM

## 2017-10-06 MED ORDER — IOPAMIDOL (ISOVUE-M 200) INJECTION 41%
1.0000 mL | Freq: Once | INTRAMUSCULAR | Status: AC
  Administered 2017-10-06: 1 mL via EPIDURAL

## 2017-10-06 MED ORDER — METHYLPREDNISOLONE ACETATE 40 MG/ML INJ SUSP (RADIOLOG
120.0000 mg | Freq: Once | INTRAMUSCULAR | Status: AC
  Administered 2017-10-06: 120 mg via EPIDURAL

## 2017-10-06 NOTE — Discharge Instructions (Signed)

## 2017-10-13 ENCOUNTER — Other Ambulatory Visit: Payer: Self-pay | Admitting: Interventional Cardiology

## 2017-10-17 ENCOUNTER — Encounter: Payer: Self-pay | Admitting: Interventional Cardiology

## 2017-10-31 ENCOUNTER — Ambulatory Visit (INDEPENDENT_AMBULATORY_CARE_PROVIDER_SITE_OTHER)
Admission: RE | Admit: 2017-10-31 | Discharge: 2017-10-31 | Disposition: A | Discharge status: Home / Self Care | Payer: Medicare Other | Source: Ambulatory Visit | Attending: Family Medicine | Admitting: Family Medicine

## 2017-10-31 ENCOUNTER — Encounter: Payer: Self-pay | Admitting: Family Medicine

## 2017-10-31 ENCOUNTER — Ambulatory Visit: Payer: Medicare Other | Admitting: Family Medicine

## 2017-10-31 VITALS — BP 130/80 | HR 70 | Ht 67.0 in | Wt 240.0 lb

## 2017-10-31 DIAGNOSIS — M25562 Pain in left knee: Secondary | ICD-10-CM | POA: Diagnosis not present

## 2017-10-31 DIAGNOSIS — M48061 Spinal stenosis, lumbar region without neurogenic claudication: Secondary | ICD-10-CM

## 2017-10-31 DIAGNOSIS — M5416 Radiculopathy, lumbar region: Secondary | ICD-10-CM

## 2017-10-31 DIAGNOSIS — G8929 Other chronic pain: Secondary | ICD-10-CM | POA: Diagnosis not present

## 2017-10-31 NOTE — Patient Instructions (Addendum)
Good to see you  Xray downstair PT will be calling you  Look into [name] and see what you think.  Stay active See me again in 6-8 weeks and see if we need a bone scan  Of the knee

## 2017-10-31 NOTE — Progress Notes (Signed)
[name] [name],  [address] Phone: [phone] Subjective:    I Monica [name] am serving as a Education administrator for Dr. Hulan Saas.    CC: Back pain follow-up  [id number]  Monica [name] is a 82 y.o. female coming in with complaint of back pain. States that her leg is painful. After the epidural she doesn't hurt as much but the leg is painful while walking. Wants to go to PT.  Patient was found to have spinal stenosis.  Given an epidural 2 weeks ago.  States that the back pain is significantly improved but still has the leg weakness and difficulty with pain from time to time.  Concerned that it could be her knee replacement.    Past Medical History:  Diagnosis Date  . Allergic rhinitis   . Anxiety   . Benign neoplasm of breast 2013  . Cardiomegaly   . Compulsive overeating   . Depression   . Diffuse cystic mastopathy 2013  . Edema   . GI bleeding   . Gout   . HTN (hypertension)   . Hyperlipidemia   . Lump or mass in breast 2012  . OA (osteoarthritis) of knee    with injections  . Obesity   . Other abnormal glucose   . Personal history of tobacco use, presenting hazards to health   . Sciatica   . Sinus infection 2010  . Unspecified sleep apnea    Past Surgical History:  Procedure Laterality Date  . adenosine cardiolite  1/08   low risk   . APPENDECTOMY    . BREAST LUMPECTOMY     right x1 ('89) left x2 ('70s, '90)  . CARDIAC CATHETERIZATION  2001   normal per pt  . COLONOSCOPY  11/02   diverticulosis  . COLONOSCOPY  9/06   diverticulosis, hemorhoids  . COLONOSCOPY N/A 10/01/2016   Procedure: COLONOSCOPY;  Surgeon: Gatha Mayer, MD;  Location: [facility];  Service: Endoscopy;  Laterality: N/A;  . dexa  3/02   normal  . EYE SURGERY  2012   cataract  . knee replacement Right 9/11   R. Dr. Telford Nab  . sleep study  5/05  . TONSILLECTOMY    . TOTAL ABDOMINAL HYSTERECTOMY     fibroids, age of 26  . TOTAL  KNEE ARTHROPLASTY Left 2012  . US TRANSVAGINAL PELVIC MODIFIED  2000, 2001   Social History   Socioeconomic History  . Marital status: Widowed    Spouse name: Not on file  . Number of children: 1  . Years of education: Not on file  . Highest education level: Not on file  Occupational History  . Occupation: Retired Ship broker: RETIRED  Social Needs  . Financial resource strain: Not on file  . Food insecurity:    Worry: Not on file    Inability: Not on file  . Transportation needs:    Medical: Not on file    Non-medical: Not on file  Tobacco Use  . Smoking status: Former Smoker    Packs/day: 0.50    Years: 20.00    Pack years: 10.00    Types: Cigarettes  . Smokeless tobacco: Never Used  . Tobacco comment: quit approx. 20 years ago  Substance and Sexual Activity  . Alcohol use: Yes    Alcohol/week: 0.0 standard drinks    Comment: wine occasional  . Drug use: No  . Sexual activity: Never  Lifestyle  . Physical  activity:    Days per week: Not on file    Minutes per session: Not on file  . Stress: Not on file  Relationships  . Social connections:    Talks on phone: Not on file    Gets together: Not on file    Attends religious service: Not on file    Active member of club or organization: Not on file    Attends meetings of clubs or organizations: Not on file    Relationship status: Not on file  Other Topics Concern  . 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 Buspirone Hcl     REACTION: made her sleepy, ? swollen legs  . Lisinopril     REACTION: dizziness  . Prozac [Fluoxetine Hcl]     sleepy   Family History  Problem Relation Age of Onset  . Stroke Father   . Hypertension Mother        (a lot of animosity in their relationship)  . Heart failure Mother   . Gout Unknown        whole family   . Prostate cancer  Brother   . Hepatitis Brother        Hep C; alcoholism (terminal)  . Breast cancer Sister   . Other Brother        drug addiction     Current Facility-Administered Medications (Endocrine & Metabolic):  .  triamcinolone acetonide (KENALOG) 10 MG/ML injection 10 mg  Current Outpatient Medications (Cardiovascular):  .  amLODipine (NORVASC) 5 MG tablet, Take 1 tablet (5 mg total) by mouth daily. Please keep upcoming appointment for further refills .  furosemide (LASIX) 40 MG tablet, TAKE 1 TABLET BY MOUTH DAILY .  metoprolol succinate (TOPROL-XL) 100 MG 24 hr tablet, TAKE 1 TABLET BY MOUTH DAILY WITH OR IMMEDIATLY FOLLOWING A MEAL     Current Outpatient Medications (Analgesics):  .  allopurinol (ZYLOPRIM) 100 MG tablet, TAKE 2 TABLETS BY MOUTH ONCE A DAY *NEED OFFICE VISIT .  nabumetone (RELAFEN) 500 MG tablet, Take 1 tablet (500 mg total) by mouth daily.     Current Outpatient Medications (Other):  Marland Kitchen  ACCU-CHEK AVIVA PLUS test strip, CHECK GLUCOSE WITH EACH MEAL AND AT BEDTIME AS DIRECTED .  ACCU-CHEK SOFTCLIX LANCETS lancets, Check blood sugar once daily and as needed. Dx R73.01 .  calcium carbonate (OS-CAL) 600 MG TABS, Take 600 mg by mouth daily.  .  Cholecalciferol (VITAMIN D3) 2000 units capsule, Take 2,000 Units by mouth daily. Marland Kitchen  gabapentin (NEURONTIN) 100 MG capsule, TAKE 2 CAPSULES (200 MG TOTAL) BY MOUTH AT BEDTIME. Marland Kitchen  MAGNESIUM CARBONATE PO, Take 250 mg by mouth daily. .  Omega-3 Fatty Acids (FISH OIL PO), Take 500 capsules by mouth daily.  .  potassium chloride SA (K-DUR,KLOR-CON) 20 MEQ tablet, Take 1 tablet (20 mEq total) by mouth daily. .  Vitamin D, Ergocalciferol, (DRISDOL) 50000 units CAPS capsule, Take 1 capsule (50,000 Units total) by mouth every 7 (seven) days.     Past medical history, social, surgical and family history all reviewed in electronic medical record.  No pertanent information unless stated regarding to the chief complaint.   Review of  Systems:  No headache, visual changes, nausea, vomiting, diarrhea, constipation, dizziness, abdominal pain, skin rash, fevers, chills, night sweats, weight loss, swollen lymph nodes, body aches,  joint swelling,  chest pain, shortness of breath, mood changes.  Positive muscle aches  Objective  Blood pressure 130/80, pulse 70, height 5\' 7"  (1.702 m), weight 240 lb (108.9 kg), SpO2 97 %.    General: No apparent distress alert and oriented x3 mood and affect normal, dressed appropriately.  HEENT: Pupils equal, extraocular movements intact  Respiratory: Patient's speak in full sentences and does not appear short of breath  Cardiovascular: Trace lower extremity edema, non tender, no erythema  Skin: Warm dry intact with no signs of infection or rash on extremities or on axial skeleton.  Abdomen: Soft nontender  Neuro: Cranial nerves II through XII are intact, neurovascularly intact in all extremities with 2+ DTRs and 2+ pulses.  Lymph: No lymphadenopathy of posterior or anterior cervical chain or axillae bilaterally.  Gait antalgic walking with the aid of a cane MSK:  Non tender with full range of motion and good stability and symmetric strength and tone of shoulders, elbows, wrist, hip, and ankles bilaterally.   Back exam shows some very minimal discomfort and pain to palpation of the paraspinal musculature but improved from previous exam.  Still significant tightness noted.  Patient does have tightness with straight leg test left greater than right.  Unable to do [place] secondary to patient's body habitus.  4 out of 5 strength in the left lower extremity  Left knee is replaced.  No significant swelling.  No significant instability noted.  Still lacking the last 10 degrees of flexion in the last 2 degrees of extension.   Impression and Recommendations:     This case required medical decision making of moderate complexity. The above documentation has been reviewed and is accurate and complete  Lyndal Pulley       Note: This dictation was prepared with Dragon dictation along with smaller phrase technology. Any transcriptional errors that result from this process are unintentional.

## 2017-10-31 NOTE — Assessment & Plan Note (Signed)
Back pain is significantly improved but continues to have unfortunately some leg weakness.  Patient has a knee replacement and may need to consider the possibility of loosening.  X-rays ordered today for further evaluation.  Patient will be sent to formal physical therapy that I think will also be beneficial.  This will help with muscle strength and conditioning as well as balance and coordination we discussed and icing regimen continue the gabapentin if she would do that on a more regular basis I think it would be beneficial.  Continue the vitamin supplementation and follow-up again in 6 weeks

## 2017-10-31 NOTE — Assessment & Plan Note (Signed)
Improved with epidural in August.  Discussed icing regimen and home exercise.  Follow-up again 6 weeks sent to physical therapy for core strengthening but for balance and coordination as well.

## 2017-11-06 NOTE — Progress Notes (Signed)
Cardiology Office Note:    Date:  11/07/2017   ID:  [name], DOB 1935/04/26, MRN [id number]  PCP:  Seward Carol, MD  Cardiologist:  No primary care provider on file.   Referring MD: Seward Carol, MD   Chief Complaint  Patient presents with  . Hypertension  . Congestive Heart Failure    History of Present Illness:    Khamani Daniely is a 82 y.o. female with a hx of severe hypertension, left ventricular hypertrophy with diastolic left ventricular heart failure, obesity, medication and therapy noncompliance, obesity, known obstructive sleep apnea but not compliant with therapy, and cardiomegaly.   Major concerns are near continuous lower back and left lower extremity discomfort.  She has had an left knee replacement.  She is having pain in the sciatic distribution.  Chronic dyspnea on exertion.  Physical deconditioning.  Denies orthopnea, chest pain, and syncope.  Using ibuprofen relatively frequent.   Past Medical History:  Diagnosis Date  . Allergic rhinitis   . Anxiety   . Benign neoplasm of breast 2013  . Cardiomegaly   . Compulsive overeating   . Depression   . Diffuse cystic mastopathy 2013  . Edema   . GI bleeding   . Gout   . HTN (hypertension)   . Hyperlipidemia   . Lump or mass in breast 2012  . OA (osteoarthritis) of knee    with injections  . Obesity   . Other abnormal glucose   . Personal history of tobacco use, presenting hazards to health   . Sciatica   . Sinus infection 2010  . Unspecified sleep apnea     Past Surgical History:  Procedure Laterality Date  . adenosine cardiolite  1/08   low risk   . APPENDECTOMY    . BREAST LUMPECTOMY     right x1 ('89) left x2 ('70s, '90)  . CARDIAC CATHETERIZATION  2001   normal per pt  . COLONOSCOPY  11/02   diverticulosis  . COLONOSCOPY  9/06   diverticulosis, hemorhoids  . COLONOSCOPY N/A 10/01/2016   Procedure: COLONOSCOPY;  Surgeon: Gatha Mayer, MD;  Location: [facility];  Service:  Endoscopy;  Laterality: N/A;  . dexa  3/02   normal  . EYE SURGERY  2012   cataract  . knee replacement Right 9/11   R. Dr. Telford Nab  . sleep study  5/05  . TONSILLECTOMY    . TOTAL ABDOMINAL HYSTERECTOMY     fibroids, age of 57  . TOTAL KNEE ARTHROPLASTY Left 2012  . US TRANSVAGINAL PELVIC MODIFIED  2000, 2001    Current Medications: Current Meds  Medication Sig  . allopurinol (ZYLOPRIM) 100 MG tablet TAKE 2 TABLETS BY MOUTH ONCE A DAY *NEED OFFICE VISIT  . amLODipine (NORVASC) 5 MG tablet Take 1 tablet (5 mg total) by mouth daily. Please keep upcoming appointment for further refills  . CALCIUM PO Take 1 tablet by mouth daily.  . cyanocobalamin 1000 MCG tablet Take 1,000 mcg by mouth daily.  . DULoxetine (CYMBALTA) 30 MG capsule Take 30 mg by mouth daily.  . furosemide (LASIX) 40 MG tablet TAKE 1 TABLET BY MOUTH DAILY  . MAGNESIUM CARBONATE PO Take 250 mg by mouth daily.  . metoprolol succinate (TOPROL-XL) 100 MG 24 hr tablet TAKE 1 TABLET BY MOUTH DAILY WITH OR IMMEDIATLY FOLLOWING A MEAL  . Misc Natural Products (TART CHERRY ADVANCED PO) Take 1 capsule by mouth daily.  . potassium chloride SA (K-DUR,KLOR-CON) 20 MEQ tablet Take 1 tablet (  20 mEq total) by mouth daily.  Marland Kitchen pyridOXINE (VITAMIN B-6) 100 MG tablet Take 100 mg by mouth daily.  . TURMERIC PO Take 1 tablet by mouth daily.  . Vitamin D, Ergocalciferol, (DRISDOL) 50000 units CAPS capsule TAKE 1 CAPSULE (50,000 UNITS TOTAL) BY MOUTH EVERY 7 (SEVEN) DAYS.  . [DISCONTINUED] calcium carbonate (OS-CAL) 600 MG TABS Take 600 mg by mouth daily.    Current Facility-Administered Medications for the 11/07/17 encounter (Office Visit) with Belva Crome, MD  Medication  . triamcinolone acetonide (KENALOG) 10 MG/ML injection 10 mg     Allergies:   Buspirone hcl; Lisinopril; and Prozac [fluoxetine hcl]   Social History   Socioeconomic History  . Marital status: Widowed    Spouse name: Not on file  . Number of children: 1  .  Years of education: Not on file  . Highest education level: Not on file  Occupational History  . Occupation: Retired Ship broker: RETIRED  Social Needs  . Financial resource strain: Not on file  . Food insecurity:    Worry: Not on file    Inability: Not on file  . Transportation needs:    Medical: Not on file    Non-medical: Not on file  Tobacco Use  . Smoking status: Former Smoker    Packs/day: 0.50    Years: 20.00    Pack years: 10.00    Types: Cigarettes  . Smokeless tobacco: Never Used  . Tobacco comment: quit approx. 20 years ago  Substance and Sexual Activity  . Alcohol use: Yes    Alcohol/week: 0.0 standard drinks    Comment: wine occasional  . Drug use: No  . Sexual activity: Never  Lifestyle  . Physical activity:    Days per week: Not on file    Minutes per session: Not on file  . Stress: Not on file  Relationships  . Social connections:    Talks on phone: Not on file    Gets together: Not on file    Attends religious service: Not on file    Active member of club or organization: Not on file    Attends meetings of clubs or organizations: Not on file    Relationship status: Not on file  Other Topics Concern  . 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  ROS:   Please see the history of present illness.    Fatigue, back pain, dizziness, depression, nasal drainage, ears popping, anxiety, and difficulty with balance.  All other systems reviewed and are negative.  EKGs/Labs/Other Studies Reviewed:    The following studies were reviewed today: No new data  EKG:  EKG is  ordered today.   The ekg ordered today demonstrates sinus rhythm with voltage consistent with LVH.  No change compared to prior.  Recent Labs: 12/12/2016: ALT 21; B Natriuretic Peptide 95.4; BUN 11; Creatinine, Ser 0.67; Hemoglobin 12.5; Platelets 287; Potassium 3.3; Sodium 142  Recent Lipid Panel    Component Value Date/Time   CHOL 192 04/15/2016 1111   TRIG 90.0 04/15/2016 1111   HDL 44.90 04/15/2016 1111  CHOLHDL 4 04/15/2016 1111   VLDL 18.0 04/15/2016 1111   LDLCALC 129 (H) 04/15/2016 1111   LDLDIRECT 131.8 12/09/2009 1643    Physical Exam:    VS:  BP (!) 156/72   Pulse 79   Ht 5\' 7"  (1.702 m)   Wt 243 lb 12.8 oz (110.6 kg)   BMI 38.18 kg/m     Wt Readings from Last 3 Encounters:  11/07/17 243 lb 12.8 oz (110.6 kg)  10/31/17 240 lb (108.9 kg)  09/20/17 240 lb (108.9 kg)     GEN: Obese. Well nourished, well developed in no acute distress HEENT: Normal NECK: No JVD. LYMPHATICS: No lymphadenopathy CARDIAC: RRR, no murmur, no gallop, trace bilateral edema. VASCULAR: 2+ bilateral radial pulses.  No bruits. RESPIRATORY:  Clear to auscultation without rales, wheezing or rhonchi  ABDOMEN: Soft, non-tender, non-distended, No pulsatile mass, MUSCULOSKELETAL: No deformity  SKIN: Warm and dry NEUROLOGIC:  Alert and oriented x 3 PSYCHIATRIC:  Normal affect   ASSESSMENT:    1. Hypertensive heart disease with heart failure ([place])   2. Chronic diastolic heart failure ([place])   3. Essential hypertension   4. Other hyperlipidemia    PLAN:    In order of problems listed above:  1. Elevated blood pressure, obesity, probable sleep apnea, with LVH and diastolic dysfunction.  We discussed weight loss, compliance with CPAP, low-salt diet, aerobic activity, and decreasing intake of nonsteroidal anti-inflammatory therapy. 2. No evidence of volume overload. 3. Target blood pressure 130/80 mmHg.  Please see above discourse with recommendations given on this visit. 4. Current lipid status not known.   February LDL cholesterol was 108.  Clinical follow-up in 1 year.  Low-salt diet, weight loss, aerobic activity, avoidance of nonsteroidal anti-inflammatory therapy, and compliance with CPAP.   Medication Adjustments/Labs and Tests Ordered: Current medicines are reviewed at length with the patient today.  Concerns regarding medicines are outlined above.  Orders Placed This Encounter  Procedures  . EKG 12-Lead   No orders of the defined types were placed in this encounter.   Patient Instructions  Medication Instructions:  Your physician recommends that you continue on your current medications as directed. Please refer to the Current Medication list given to you today.  Labwork: None  Testing/Procedures: None  Follow-Up: Your physician wants you to follow-up in: 1 year with Dr. Tamala Julian. You will receive a reminder letter in the mail two months in advance. If you don't receive a letter, please call our office to schedule the follow-up appointment.   Any Other Special Instructions Will Be Listed Below (If Applicable).  Monitor your blood pressure once a month.  If blood pressure is 150/90 or higher, please contact the office.  If you discover it is 150/90 or higher, please monitor blood pressure daily for 5-7 days and call with those recordings.    If you need a refill on your cardiac medications before your next appointment, please call your pharmacy.      Signed, Sinclair Grooms, MD  11/07/2017 6:06 PM    [facility]

## 2017-11-07 ENCOUNTER — Other Ambulatory Visit: Payer: Self-pay | Admitting: Family Medicine

## 2017-11-07 ENCOUNTER — Ambulatory Visit: Payer: Medicare Other | Admitting: Interventional Cardiology

## 2017-11-07 ENCOUNTER — Encounter: Payer: Self-pay | Admitting: Interventional Cardiology

## 2017-11-07 VITALS — BP 156/72 | HR 79 | Ht 67.0 in | Wt 243.8 lb

## 2017-11-07 DIAGNOSIS — I110 Hypertensive heart disease with heart failure: Secondary | ICD-10-CM | POA: Diagnosis not present

## 2017-11-07 DIAGNOSIS — I5032 Chronic diastolic (congestive) heart failure: Secondary | ICD-10-CM

## 2017-11-07 DIAGNOSIS — E7849 Other hyperlipidemia: Secondary | ICD-10-CM | POA: Diagnosis not present

## 2017-11-07 DIAGNOSIS — I10 Essential (primary) hypertension: Secondary | ICD-10-CM

## 2017-11-07 NOTE — Patient Instructions (Signed)
Medication Instructions:  Your physician recommends that you continue on your current medications as directed. Please refer to the Current Medication list given to you today.  Labwork: None  Testing/Procedures: None  Follow-Up: Your physician wants you to follow-up in: 1 year with Dr. Tamala Julian. You will receive a reminder letter in the mail two months in advance. If you don't receive a letter, please call our office to schedule the follow-up appointment.   Any Other Special Instructions Will Be Listed Below (If Applicable).  Monitor your blood pressure once a month.  If blood pressure is 150/90 or higher, please contact the office.  If you discover it is 150/90 or higher, please monitor blood pressure daily for 5-7 days and call with those recordings.    If you need a refill on your cardiac medications before your next appointment, please call your pharmacy.

## 2017-11-09 ENCOUNTER — Telehealth: Payer: Self-pay | Admitting: *Deleted

## 2017-11-09 DIAGNOSIS — M5416 Radiculopathy, lumbar region: Secondary | ICD-10-CM

## 2017-11-09 DIAGNOSIS — K922 Gastrointestinal hemorrhage, unspecified: Secondary | ICD-10-CM

## 2017-11-09 NOTE — Telephone Encounter (Signed)
Copied from [place] [id number]. Topic: Inquiry >> Nov 09, 2017  1:29 PM Monica [name] wrote: Reason for CRM: pt called to inform Dr. Tamala Julian to call [facility] for her to be scheduled for her 2nd epidural; contact pt if needed

## 2017-11-09 NOTE — Telephone Encounter (Signed)
lmovm advising pt epidural has been ordered & sent to [facility].

## 2017-11-15 ENCOUNTER — Ambulatory Visit
Admission: RE | Admit: 2017-11-15 | Discharge: 2017-11-15 | Disposition: A | Discharge status: Home / Self Care | Payer: Medicare Other | Source: Ambulatory Visit | Attending: Family Medicine | Admitting: Family Medicine

## 2017-11-15 DIAGNOSIS — M5416 Radiculopathy, lumbar region: Secondary | ICD-10-CM

## 2017-11-15 MED ORDER — METHYLPREDNISOLONE ACETATE 40 MG/ML INJ SUSP (RADIOLOG
120.0000 mg | Freq: Once | INTRAMUSCULAR | Status: AC
  Administered 2017-11-15: 120 mg via EPIDURAL

## 2017-11-15 MED ORDER — IOPAMIDOL (ISOVUE-M 200) INJECTION 41%
1.0000 mL | Freq: Once | INTRAMUSCULAR | Status: AC
  Administered 2017-11-15: 1 mL via EPIDURAL

## 2017-11-15 NOTE — Discharge Instructions (Signed)

## 2017-11-21 ENCOUNTER — Other Ambulatory Visit: Payer: Self-pay | Admitting: Interventional Cardiology

## 2018-01-22 ENCOUNTER — Other Ambulatory Visit: Payer: Self-pay | Admitting: Family Medicine

## 2018-03-07 ENCOUNTER — Encounter: Payer: Self-pay | Admitting: General Surgery

## 2018-03-08 ENCOUNTER — Ambulatory Visit: Payer: Medicare Other | Admitting: General Surgery

## 2018-03-29 ENCOUNTER — Ambulatory Visit: Payer: Medicare Other | Admitting: General Surgery

## 2018-04-05 ENCOUNTER — Other Ambulatory Visit: Payer: Self-pay | Admitting: Internal Medicine

## 2018-04-05 DIAGNOSIS — M79605 Pain in left leg: Secondary | ICD-10-CM

## 2018-04-06 ENCOUNTER — Ambulatory Visit
Admission: RE | Admit: 2018-04-06 | Discharge: 2018-04-06 | Disposition: A | Discharge status: Home / Self Care | Payer: Medicare Other | Source: Ambulatory Visit | Attending: Internal Medicine | Admitting: Internal Medicine

## 2018-04-06 DIAGNOSIS — M79605 Pain in left leg: Secondary | ICD-10-CM

## 2018-04-12 ENCOUNTER — Ambulatory Visit: Payer: Medicare Other | Admitting: General Surgery

## 2018-04-12 ENCOUNTER — Encounter: Payer: Self-pay | Admitting: General Surgery

## 2018-04-12 ENCOUNTER — Other Ambulatory Visit: Payer: Self-pay

## 2018-04-12 VITALS — BP 139/70 | HR 84 | Temp 97.2°F | Resp 18 | Ht 67.0 in | Wt 247.0 lb

## 2018-04-12 DIAGNOSIS — Z803 Family history of malignant neoplasm of breast: Secondary | ICD-10-CM | POA: Diagnosis not present

## 2018-04-12 NOTE — Patient Instructions (Addendum)
The patient is aware to call back for any questions or new concerns. [facility] Dr. Thornton Park or [facility] Dr. Marry Guan Patient will be asked to return to the office in one year with a bilateral screening mammogram.

## 2018-04-12 NOTE — Progress Notes (Signed)
Patient ID: Monica [name], female   DOB: February 07, 1936, 83 y.o.   MRN: [id number]  Chief Complaint  Patient presents with  . Follow-up    one year f/u recall bil screening mammo [facility] 02/28/18    HPI Monica [name] is a 83 y.o. female.  who presents for a breast evaluation. The most recent mammogram was done on 02-28-18.  Patient does perform regular self breast checks and gets regular mammograms done.   She states her left breast/axilla is a little tender, since she has been going to PT.  HPI  Past Medical History:  Diagnosis Date  . Allergic rhinitis   . Anxiety   . Benign neoplasm of breast 2013  . Cardiomegaly   . Compulsive overeating   . Depression   . Diffuse cystic mastopathy 2013  . Edema   . GI bleeding   . Gout   . HTN (hypertension)   . Hyperlipidemia   . Lump or mass in breast 2012  . OA (osteoarthritis) of knee    with injections  . Obesity   . Other abnormal glucose   . Personal history of tobacco use, presenting hazards to health   . Sciatica   . Sinus infection 2010  . Unspecified sleep apnea     Past Surgical History:  Procedure Laterality Date  . adenosine cardiolite  1/08   low risk   . APPENDECTOMY    . BREAST LUMPECTOMY     right x1 ('89) left x2 ('70s, '90)  . CARDIAC CATHETERIZATION  2001   normal per pt  . COLONOSCOPY  11/02   diverticulosis  . COLONOSCOPY  9/06   diverticulosis, hemorhoids  . COLONOSCOPY N/A 10/01/2016   Procedure: COLONOSCOPY;  Surgeon: Gatha Mayer, MD;  Location: [facility];  Service: Endoscopy;  Laterality: N/A;  . dexa  3/02   normal  . EYE SURGERY  2012   cataract  . knee replacement Right 9/11   R. Dr. Telford Nab  . sleep study  5/05  . TONSILLECTOMY    . TOTAL ABDOMINAL HYSTERECTOMY     fibroids, age of 72  . TOTAL KNEE ARTHROPLASTY Left 2012  . US TRANSVAGINAL PELVIC MODIFIED  2000, 2001    Family History  Problem Relation Age of Onset  . Stroke Father   . Hypertension Mother        (a lot of  animosity in their relationship)  . Heart failure Mother   . Gout Unknown        whole family   . Prostate cancer Brother   . Hepatitis Brother        Hep C; alcoholism (terminal)  . Breast cancer Sister   . Other Brother        drug addiction    Social History Social History   Tobacco Use  . Smoking status: Former Smoker    Packs/day: 0.50    Years: 20.00    Pack years: 10.00    Types: Cigarettes  . Smokeless tobacco: Never Used  . Tobacco comment: quit approx. 20 years ago  Substance Use Topics  . Alcohol use: Yes    Alcohol/week: 0.0 standard drinks    Comment: wine occasional  . Drug use: No    Allergies  Allergen Reactions  . Buspirone Hcl     REACTION: made her sleepy, ? swollen legs  . Lisinopril     REACTION: dizziness  . Prozac [Fluoxetine Hcl]     sleepy    Current Outpatient Medications  Medication Sig Dispense Refill  . allopurinol (ZYLOPRIM) 100 MG tablet TAKE 2 TABLETS BY MOUTH ONCE A DAY *NEED OFFICE VISIT 60 tablet 0  . amLODipine (NORVASC) 5 MG tablet TAKE 1 TABLET (5 MG TOTAL) BY MOUTH DAILY. PLEASE KEEP UPCOMING APPOINTMENT FOR FURTHER REFILLS 30 tablet 10  . CALCIUM PO Take 1 tablet by mouth daily.    . cyanocobalamin 1000 MCG tablet Take 1,000 mcg by mouth daily.    . DULoxetine (CYMBALTA) 30 MG capsule Take 30 mg by mouth daily.  6  . furosemide (LASIX) 40 MG tablet TAKE 1 TABLET BY MOUTH DAILY 30 tablet 1  . MAGNESIUM CARBONATE PO Take 250 mg by mouth daily.    . metoprolol succinate (TOPROL-XL) 100 MG 24 hr tablet TAKE 1 TABLET BY MOUTH DAILY WITH OR IMMEDIATLY FOLLOWING A MEAL 30 tablet 5  . Misc Natural Products (TART CHERRY ADVANCED PO) Take 1 capsule by mouth daily.    . potassium chloride SA (K-DUR,KLOR-CON) 20 MEQ tablet Take 1 tablet (20 mEq total) by mouth daily. 30 tablet 11  . pyridOXINE (VITAMIN B-6) 100 MG tablet Take 100 mg by mouth daily.    . TURMERIC PO Take 1 tablet by mouth daily.    . Vitamin D, Ergocalciferol, (DRISDOL)  1.25 MG (50000 UT) CAPS capsule TAKE 1 CAPSULE (50,000 UNITS TOTAL) BY MOUTH EVERY 7 (SEVEN) DAYS. 12 capsule 0   Current Facility-Administered Medications  Medication Dose Route Frequency Provider Last Rate Last Dose  . triamcinolone acetonide (KENALOG) 10 MG/ML injection 10 mg  10 mg Other Once Wallene Huh, DPM        Review of Systems Review of Systems  Constitutional: Negative.   Respiratory: Negative.   Cardiovascular: Negative.     Blood pressure 139/70, pulse 84, temperature (!) 97.2 F (36.2 C), temperature source Temporal, resp. rate 18, height 5\' 7"  (1.702 m), weight 247 lb (112 kg), SpO2 97 %.  Physical Exam Physical Exam Constitutional:      Appearance: She is well-developed.  Eyes:     General: No scleral icterus.    Conjunctiva/sclera: Conjunctivae normal.  Neck:     Musculoskeletal: Neck supple.  Cardiovascular:     Rate and Rhythm: Normal rate and regular rhythm.     Heart sounds: Normal heart sounds.  Pulmonary:     Effort: Pulmonary effort is normal.     Breath sounds: Normal breath sounds.  Chest:     Breasts:        Right: No inverted nipple, mass, nipple discharge, skin change or tenderness.        Left: No inverted nipple, mass, nipple discharge ( ), skin change or tenderness.       Comments:   Lymphadenopathy:     Cervical: No cervical adenopathy.  Skin:    General: Skin is warm and dry.  Neurological:     Mental Status: She is alert and oriented to person, place, and time.  Psychiatric:        Behavior: Behavior normal.     Data Reviewed Bilateral screening mammograms completed at [facility] dated February 28, 2018 were reported as unchanged.  BI-RADS-2.  Assessment Benign breast exam.  Soreness of the left chest wall muscles from exercise.   Plan  The patient is aware to call back for any questions or new concerns. Patient will be asked to return to the office in one year with a bilateral screening mammogram. Recommend orthopedic  evaluation either [facility] Dr. Thornton Park  or [facility] Dr. Marry Guan     HPI, assessment, plan and physical exam has been scribed under the direction and in the presence of Robert Bellow, MD. Karie Fetch, RN  I have completed the exam and reviewed the above documentation for accuracy and completeness.  I agree with the above.  Haematologist has been used and any errors in dictation or transcription are unintentional.  Hervey Ard, M.D., F.A.C.S.   Forest Gleason Ladarrell Cornwall 04/12/2018, 8:22 PM

## 2018-07-24 ENCOUNTER — Other Ambulatory Visit: Payer: Self-pay | Admitting: Gastroenterology

## 2018-07-24 DIAGNOSIS — R1310 Dysphagia, unspecified: Secondary | ICD-10-CM

## 2018-07-26 ENCOUNTER — Ambulatory Visit
Admission: RE | Admit: 2018-07-26 | Discharge: 2018-07-26 | Disposition: A | Discharge status: Home / Self Care | Payer: Medicare Other | Source: Ambulatory Visit | Attending: Gastroenterology | Admitting: Gastroenterology

## 2018-07-26 ENCOUNTER — Other Ambulatory Visit: Payer: Self-pay | Admitting: Gastroenterology

## 2018-07-26 DIAGNOSIS — R1310 Dysphagia, unspecified: Secondary | ICD-10-CM

## 2018-09-01 ENCOUNTER — Emergency Department (HOSPITAL_COMMUNITY): Payer: Medicare Other

## 2018-09-01 ENCOUNTER — Encounter (HOSPITAL_COMMUNITY)
Admission: EM | Disposition: A | Discharge status: Home Health | Payer: Self-pay | Source: Home / Self Care | Attending: Neurology

## 2018-09-01 ENCOUNTER — Inpatient Hospital Stay (HOSPITAL_COMMUNITY)
Admission: EM | Admit: 2018-09-01 | Discharge: 2018-09-05 | DRG: 023 | Disposition: A | Discharge status: Home Health | Payer: Medicare Other | Attending: Neurology | Admitting: Neurology

## 2018-09-01 ENCOUNTER — Emergency Department (HOSPITAL_COMMUNITY): Payer: Medicare Other | Admitting: Anesthesiology

## 2018-09-01 ENCOUNTER — Inpatient Hospital Stay (HOSPITAL_COMMUNITY): Payer: Medicare Other

## 2018-09-01 ENCOUNTER — Other Ambulatory Visit: Payer: Self-pay

## 2018-09-01 ENCOUNTER — Encounter (HOSPITAL_COMMUNITY): Payer: Self-pay

## 2018-09-01 DIAGNOSIS — I63512 Cerebral infarction due to unspecified occlusion or stenosis of left middle cerebral artery: Secondary | ICD-10-CM

## 2018-09-01 DIAGNOSIS — E669 Obesity, unspecified: Secondary | ICD-10-CM | POA: Diagnosis not present

## 2018-09-01 DIAGNOSIS — Z90710 Acquired absence of both cervix and uterus: Secondary | ICD-10-CM

## 2018-09-01 DIAGNOSIS — R29810 Facial weakness: Secondary | ICD-10-CM | POA: Diagnosis present

## 2018-09-01 DIAGNOSIS — J95831 Postprocedural hemorrhage and hematoma of a respiratory system organ or structure following other procedure: Secondary | ICD-10-CM | POA: Diagnosis not present

## 2018-09-01 DIAGNOSIS — Z79899 Other long term (current) drug therapy: Secondary | ICD-10-CM | POA: Diagnosis not present

## 2018-09-01 DIAGNOSIS — I63412 Cerebral infarction due to embolism of left middle cerebral artery: Principal | ICD-10-CM | POA: Diagnosis present

## 2018-09-01 DIAGNOSIS — D62 Acute posthemorrhagic anemia: Secondary | ICD-10-CM | POA: Diagnosis not present

## 2018-09-01 DIAGNOSIS — I471 Supraventricular tachycardia: Secondary | ICD-10-CM | POA: Diagnosis not present

## 2018-09-01 DIAGNOSIS — I639 Cerebral infarction, unspecified: Secondary | ICD-10-CM | POA: Diagnosis present

## 2018-09-01 DIAGNOSIS — Z1159 Encounter for screening for other viral diseases: Secondary | ICD-10-CM | POA: Diagnosis not present

## 2018-09-01 DIAGNOSIS — Z01818 Encounter for other preprocedural examination: Secondary | ICD-10-CM

## 2018-09-01 DIAGNOSIS — E785 Hyperlipidemia, unspecified: Secondary | ICD-10-CM | POA: Diagnosis present

## 2018-09-01 DIAGNOSIS — R0689 Other abnormalities of breathing: Secondary | ICD-10-CM

## 2018-09-01 DIAGNOSIS — Z6839 Body mass index (BMI) 39.0-39.9, adult: Secondary | ICD-10-CM

## 2018-09-01 DIAGNOSIS — I5033 Acute on chronic diastolic (congestive) heart failure: Secondary | ICD-10-CM | POA: Diagnosis present

## 2018-09-01 DIAGNOSIS — I82433 Acute embolism and thrombosis of popliteal vein, bilateral: Secondary | ICD-10-CM | POA: Diagnosis not present

## 2018-09-01 DIAGNOSIS — Z87891 Personal history of nicotine dependence: Secondary | ICD-10-CM | POA: Diagnosis not present

## 2018-09-01 DIAGNOSIS — I6602 Occlusion and stenosis of left middle cerebral artery: Secondary | ICD-10-CM | POA: Diagnosis not present

## 2018-09-01 DIAGNOSIS — Z823 Family history of stroke: Secondary | ICD-10-CM

## 2018-09-01 DIAGNOSIS — I110 Hypertensive heart disease with heart failure: Secondary | ICD-10-CM | POA: Diagnosis present

## 2018-09-01 DIAGNOSIS — I2510 Atherosclerotic heart disease of native coronary artery without angina pectoris: Secondary | ICD-10-CM | POA: Diagnosis present

## 2018-09-01 DIAGNOSIS — R040 Epistaxis: Secondary | ICD-10-CM | POA: Diagnosis not present

## 2018-09-01 DIAGNOSIS — Z96653 Presence of artificial knee joint, bilateral: Secondary | ICD-10-CM | POA: Diagnosis present

## 2018-09-01 DIAGNOSIS — D638 Anemia in other chronic diseases classified elsewhere: Secondary | ICD-10-CM | POA: Diagnosis present

## 2018-09-01 DIAGNOSIS — J9601 Acute respiratory failure with hypoxia: Secondary | ICD-10-CM | POA: Diagnosis present

## 2018-09-01 DIAGNOSIS — G8191 Hemiplegia, unspecified affecting right dominant side: Secondary | ICD-10-CM | POA: Diagnosis present

## 2018-09-01 DIAGNOSIS — I484 Atypical atrial flutter: Secondary | ICD-10-CM | POA: Diagnosis not present

## 2018-09-01 DIAGNOSIS — I5032 Chronic diastolic (congestive) heart failure: Secondary | ICD-10-CM | POA: Diagnosis not present

## 2018-09-01 DIAGNOSIS — I82443 Acute embolism and thrombosis of tibial vein, bilateral: Secondary | ICD-10-CM | POA: Diagnosis present

## 2018-09-01 DIAGNOSIS — I10 Essential (primary) hypertension: Secondary | ICD-10-CM | POA: Diagnosis not present

## 2018-09-01 DIAGNOSIS — I480 Paroxysmal atrial fibrillation: Secondary | ICD-10-CM | POA: Diagnosis not present

## 2018-09-01 DIAGNOSIS — G4733 Obstructive sleep apnea (adult) (pediatric): Secondary | ICD-10-CM | POA: Diagnosis present

## 2018-09-01 DIAGNOSIS — I82453 Acute embolism and thrombosis of peroneal vein, bilateral: Secondary | ICD-10-CM | POA: Diagnosis present

## 2018-09-01 DIAGNOSIS — R4701 Aphasia: Secondary | ICD-10-CM | POA: Diagnosis present

## 2018-09-01 DIAGNOSIS — I4891 Unspecified atrial fibrillation: Secondary | ICD-10-CM | POA: Diagnosis present

## 2018-09-01 DIAGNOSIS — Z8719 Personal history of other diseases of the digestive system: Secondary | ICD-10-CM | POA: Diagnosis not present

## 2018-09-01 DIAGNOSIS — I4892 Unspecified atrial flutter: Secondary | ICD-10-CM | POA: Diagnosis not present

## 2018-09-01 DIAGNOSIS — R29708 NIHSS score 8: Secondary | ICD-10-CM | POA: Diagnosis present

## 2018-09-01 DIAGNOSIS — Z7901 Long term (current) use of anticoagulants: Secondary | ICD-10-CM | POA: Diagnosis not present

## 2018-09-01 DIAGNOSIS — T45615A Adverse effect of thrombolytic drugs, initial encounter: Secondary | ICD-10-CM | POA: Diagnosis not present

## 2018-09-01 DIAGNOSIS — R58 Hemorrhage, not elsewhere classified: Secondary | ICD-10-CM | POA: Diagnosis not present

## 2018-09-01 DIAGNOSIS — Z96652 Presence of left artificial knee joint: Secondary | ICD-10-CM | POA: Diagnosis present

## 2018-09-01 DIAGNOSIS — E1149 Type 2 diabetes mellitus with other diabetic neurological complication: Secondary | ICD-10-CM | POA: Diagnosis not present

## 2018-09-01 DIAGNOSIS — I483 Typical atrial flutter: Secondary | ICD-10-CM | POA: Diagnosis not present

## 2018-09-01 DIAGNOSIS — Z9119 Patient's noncompliance with other medical treatment and regimen: Secondary | ICD-10-CM

## 2018-09-01 HISTORY — PX: RADIOLOGY WITH ANESTHESIA: SHX6223

## 2018-09-01 HISTORY — PX: IR CT HEAD LTD: IMG2386

## 2018-09-01 HISTORY — PX: IR PERCUTANEOUS ART THROMBECTOMY/INFUSION INTRACRANIAL INC DIAG ANGIO: IMG6087

## 2018-09-01 HISTORY — DX: Cerebral infarction, unspecified: I63.9

## 2018-09-01 HISTORY — DX: Occlusion and stenosis of left middle cerebral artery: I66.02

## 2018-09-01 LAB — CBC
HCT: 36 % (ref 36.0–46.0)
Hemoglobin: 11 g/dL — ABNORMAL LOW (ref 12.0–15.0)
MCH: 25.7 pg — ABNORMAL LOW (ref 26.0–34.0)
MCHC: 30.6 g/dL (ref 30.0–36.0)
MCV: 84.1 fL (ref 80.0–100.0)
Platelets: 279 10*3/uL (ref 150–400)
RBC: 4.28 MIL/uL (ref 3.87–5.11)
RDW: 17.6 % — ABNORMAL HIGH (ref 11.5–15.5)
WBC: 7.5 10*3/uL (ref 4.0–10.5)
nRBC: 0 % (ref 0.0–0.2)

## 2018-09-01 LAB — DIFFERENTIAL
Abs Immature Granulocytes: 0.02 10*3/uL (ref 0.00–0.07)
Basophils Absolute: 0.1 10*3/uL (ref 0.0–0.1)
Basophils Relative: 1 %
Eosinophils Absolute: 0.2 10*3/uL (ref 0.0–0.5)
Eosinophils Relative: 3 %
Immature Granulocytes: 0 %
Lymphocytes Relative: 33 %
Lymphs Abs: 2.4 10*3/uL (ref 0.7–4.0)
Monocytes Absolute: 0.6 10*3/uL (ref 0.1–1.0)
Monocytes Relative: 8 %
Neutro Abs: 4.2 10*3/uL (ref 1.7–7.7)
Neutrophils Relative %: 55 %

## 2018-09-01 LAB — TYPE AND SCREEN
ABO/RH(D): O POS
Antibody Screen: NEGATIVE

## 2018-09-01 LAB — I-STAT CHEM 8, ED
BUN: 29 mg/dL — ABNORMAL HIGH (ref 8–23)
Calcium, Ion: 1.15 mmol/L (ref 1.15–1.40)
Chloride: 106 mmol/L (ref 98–111)
Creatinine, Ser: 1.1 mg/dL — ABNORMAL HIGH (ref 0.44–1.00)
Glucose, Bld: 102 mg/dL — ABNORMAL HIGH (ref 70–99)
HCT: 35 % — ABNORMAL LOW (ref 36.0–46.0)
Hemoglobin: 11.9 g/dL — ABNORMAL LOW (ref 12.0–15.0)
Potassium: 4.1 mmol/L (ref 3.5–5.1)
Sodium: 141 mmol/L (ref 135–145)
TCO2: 27 mmol/L (ref 22–32)

## 2018-09-01 LAB — SARS CORONAVIRUS 2 BY RT PCR (HOSPITAL ORDER, PERFORMED IN ~~LOC~~ HOSPITAL LAB): SARS Coronavirus 2: NEGATIVE

## 2018-09-01 LAB — POCT I-STAT 7, (LYTES, BLD GAS, ICA,H+H)
Bicarbonate: 23.4 mmol/L (ref 20.0–28.0)
Calcium, Ion: 1.17 mmol/L (ref 1.15–1.40)
HCT: 26 % — ABNORMAL LOW (ref 36.0–46.0)
Hemoglobin: 8.8 g/dL — ABNORMAL LOW (ref 12.0–15.0)
O2 Saturation: 100 %
Patient temperature: 96.8
Potassium: 3.2 mmol/L — ABNORMAL LOW (ref 3.5–5.1)
Sodium: 143 mmol/L (ref 135–145)
TCO2: 24 mmol/L (ref 22–32)
pCO2 arterial: 30.4 mmHg — ABNORMAL LOW (ref 32.0–48.0)
pH, Arterial: 7.491 — ABNORMAL HIGH (ref 7.350–7.450)
pO2, Arterial: 422 mmHg — ABNORMAL HIGH (ref 83.0–108.0)

## 2018-09-01 LAB — COMPREHENSIVE METABOLIC PANEL
ALT: 16 U/L (ref 0–44)
AST: 22 U/L (ref 15–41)
Albumin: 3.6 g/dL (ref 3.5–5.0)
Alkaline Phosphatase: 99 U/L (ref 38–126)
Anion gap: 10 (ref 5–15)
BUN: 27 mg/dL — ABNORMAL HIGH (ref 8–23)
CO2: 26 mmol/L (ref 22–32)
Calcium: 9.3 mg/dL (ref 8.9–10.3)
Chloride: 105 mmol/L (ref 98–111)
Creatinine, Ser: 1.17 mg/dL — ABNORMAL HIGH (ref 0.44–1.00)
GFR calc Af Amer: 51 mL/min — ABNORMAL LOW (ref >60)
GFR calc non Af Amer: 44 mL/min — ABNORMAL LOW (ref >60)
Glucose, Bld: 106 mg/dL — ABNORMAL HIGH (ref 70–99)
Potassium: 4.2 mmol/L (ref 3.5–5.1)
Sodium: 141 mmol/L (ref 135–145)
Total Bilirubin: 0.5 mg/dL (ref 0.3–1.2)
Total Protein: 6.8 g/dL (ref 6.5–8.1)

## 2018-09-01 LAB — PROTIME-INR
INR: 1 (ref 0.8–1.2)
Prothrombin Time: 13 seconds (ref 11.4–15.2)

## 2018-09-01 LAB — APTT: aPTT: 30 seconds (ref 24–36)

## 2018-09-01 LAB — CBG MONITORING, ED: Glucose-Capillary: 102 mg/dL — ABNORMAL HIGH (ref 70–99)

## 2018-09-01 SURGERY — IR WITH ANESTHESIA
Anesthesia: General

## 2018-09-01 MED ORDER — NITROGLYCERIN 1 MG/10 ML FOR IR/CATH LAB
INTRA_ARTERIAL | Status: AC
  Filled 2018-09-01: qty 10

## 2018-09-01 MED ORDER — NITROGLYCERIN 1 MG/10 ML FOR IR/CATH LAB
INTRA_ARTERIAL | Status: AC | PRN
  Administered 2018-09-01: 25 ug via INTRA_ARTERIAL

## 2018-09-01 MED ORDER — EPTIFIBATIDE 20 MG/10ML IV SOLN
INTRAVENOUS | Status: AC
  Filled 2018-09-01: qty 10

## 2018-09-01 MED ORDER — ACETAMINOPHEN 650 MG RE SUPP: 650.0000 mg | RECTAL | Status: DC | PRN

## 2018-09-01 MED ORDER — SODIUM CHLORIDE 0.9 % IV SOLN
INTRAVENOUS | Status: DC | PRN
  Administered 2018-09-01: 20:00:00 via INTRAVENOUS

## 2018-09-01 MED ORDER — PROPOFOL 10 MG/ML IV BOLUS
INTRAVENOUS | Status: AC
  Filled 2018-09-01: qty 20

## 2018-09-01 MED ORDER — SODIUM CHLORIDE 0.9 % IV SOLN
INTRAVENOUS | Status: DC
  Administered 2018-09-01: 21:00:00 via INTRAVENOUS

## 2018-09-01 MED ORDER — PHENYLEPHRINE HCL (PRESSORS) 10 MG/ML IV SOLN
INTRAVENOUS | Status: DC | PRN
  Administered 2018-09-01 (×2): 40 ug via INTRAVENOUS

## 2018-09-01 MED ORDER — ACETAMINOPHEN 160 MG/5ML PO SOLN
650.0000 mg | ORAL | Status: DC | PRN
  Administered 2018-09-03: 650 mg
  Filled 2018-09-01: qty 20.3

## 2018-09-01 MED ORDER — ALTEPLASE (STROKE) FULL DOSE INFUSION
90.0000 mg | Freq: Once | INTRAVENOUS | Status: AC
  Administered 2018-09-01: 90 mg via INTRAVENOUS
  Filled 2018-09-01: qty 100

## 2018-09-01 MED ORDER — STROKE: EARLY STAGES OF RECOVERY BOOK
Freq: Once | Status: AC
  Administered 2018-09-02 (×2)
  Filled 2018-09-01: qty 1

## 2018-09-01 MED ORDER — PANTOPRAZOLE SODIUM 40 MG IV SOLR
40.0000 mg | Freq: Every day | INTRAVENOUS | Status: DC
  Administered 2018-09-02: 40 mg via INTRAVENOUS
  Filled 2018-09-01: qty 40

## 2018-09-01 MED ORDER — CHLORHEXIDINE GLUCONATE 0.12% ORAL RINSE (MEDLINE KIT)
15.0000 mL | Freq: Two times a day (BID) | OROMUCOSAL | Status: DC
  Administered 2018-09-02 – 2018-09-05 (×7): 15 mL via OROMUCOSAL

## 2018-09-01 MED ORDER — SODIUM CHLORIDE 0.9 % IV SOLN
INTRAVENOUS | Status: DC
  Administered 2018-09-01 – 2018-09-02 (×2): via INTRAVENOUS

## 2018-09-01 MED ORDER — SODIUM CHLORIDE 0.9% IV SOLUTION: Freq: Once | INTRAVENOUS | Status: DC

## 2018-09-01 MED ORDER — SODIUM CHLORIDE 0.9 % IV SOLN
50.0000 mL | Freq: Once | INTRAVENOUS | Status: AC
  Administered 2018-09-01: 50 mL via INTRAVENOUS

## 2018-09-01 MED ORDER — IOHEXOL 300 MG/ML  SOLN
150.0000 mL | Freq: Once | INTRAMUSCULAR | Status: AC | PRN
  Administered 2018-09-01: 75 mL via INTRA_ARTERIAL

## 2018-09-01 MED ORDER — ORAL CARE MOUTH RINSE
15.0000 mL | OROMUCOSAL | Status: DC
  Administered 2018-09-02 (×5): 15 mL via OROMUCOSAL

## 2018-09-01 MED ORDER — ACETAMINOPHEN 160 MG/5ML PO SOLN: 650.0000 mg | ORAL | Status: DC | PRN

## 2018-09-01 MED ORDER — OXYMETAZOLINE HCL 0.05 % NA SOLN
NASAL | Status: AC
  Filled 2018-09-01: qty 30

## 2018-09-01 MED ORDER — SODIUM CHLORIDE 0.9% FLUSH
3.0000 mL | Freq: Once | INTRAVENOUS | Status: AC
  Administered 2018-09-02: 3 mL via INTRAVENOUS

## 2018-09-01 MED ORDER — PROPOFOL 1000 MG/100ML IV EMUL
5.0000 ug/kg/min | INTRAVENOUS | Status: DC
  Administered 2018-09-01: 23:00:00 via INTRAVENOUS
  Administered 2018-09-02: 30 ug/kg/min via INTRAVENOUS
  Filled 2018-09-01: qty 100

## 2018-09-01 MED ORDER — HYDRALAZINE HCL 20 MG/ML IJ SOLN
INTRAMUSCULAR | Status: DC | PRN
  Administered 2018-09-01 (×2): 5 mg via INTRAVENOUS

## 2018-09-01 MED ORDER — CHLORHEXIDINE GLUCONATE CLOTH 2 % EX PADS
6.0000 | MEDICATED_PAD | Freq: Every day | CUTANEOUS | Status: DC
  Administered 2018-09-04 – 2018-09-05 (×2): 6 via TOPICAL

## 2018-09-01 MED ORDER — PROPOFOL 1000 MG/100ML IV EMUL
INTRAVENOUS | Status: AC
  Administered 2018-09-01: 23:00:00 via INTRAVENOUS
  Filled 2018-09-01: qty 100

## 2018-09-01 MED ORDER — ROCURONIUM BROMIDE 100 MG/10ML IV SOLN
INTRAVENOUS | Status: DC | PRN
  Administered 2018-09-01 (×3): 50 mg via INTRAVENOUS

## 2018-09-01 MED ORDER — ACETAMINOPHEN 325 MG PO TABS: 650.0000 mg | ORAL_TABLET | ORAL | Status: DC | PRN

## 2018-09-01 MED ORDER — CEFAZOLIN SODIUM-DEXTROSE 2-4 GM/100ML-% IV SOLN
INTRAVENOUS | Status: AC
  Filled 2018-09-01: qty 100

## 2018-09-01 MED ORDER — PROPOFOL 500 MG/50ML IV EMUL
INTRAVENOUS | Status: DC | PRN
  Administered 2018-09-01: 75 ug/kg/min via INTRAVENOUS

## 2018-09-01 MED ORDER — PROPOFOL 10 MG/ML IV BOLUS
INTRAVENOUS | Status: DC | PRN
  Administered 2018-09-01 (×2): 40 mg via INTRAVENOUS
  Administered 2018-09-01: 140 mg via INTRAVENOUS

## 2018-09-01 MED ORDER — SUCCINYLCHOLINE CHLORIDE 20 MG/ML IJ SOLN
INTRAMUSCULAR | Status: DC | PRN
  Administered 2018-09-01: 120 mg via INTRAVENOUS

## 2018-09-01 MED ORDER — CEFAZOLIN SODIUM-DEXTROSE 2-3 GM-%(50ML) IV SOLR
INTRAVENOUS | Status: DC | PRN
  Administered 2018-09-01: 2 g via INTRAVENOUS

## 2018-09-01 MED ORDER — LIDOCAINE HCL (CARDIAC) PF 100 MG/5ML IV SOSY
PREFILLED_SYRINGE | INTRAVENOUS | Status: DC | PRN
  Administered 2018-09-01: 60 mg via INTRATRACHEAL

## 2018-09-01 MED ORDER — OXYMETAZOLINE HCL 0.05 % NA SOLN
NASAL | Status: DC | PRN
  Administered 2018-09-01: 3 via NASAL

## 2018-09-01 MED ORDER — CLEVIDIPINE BUTYRATE 0.5 MG/ML IV EMUL
0.0000 mg/h | INTRAVENOUS | Status: DC
  Administered 2018-09-01: 2 mg/h via INTRAVENOUS
  Filled 2018-09-01 (×3): qty 50

## 2018-09-01 MED ORDER — SODIUM CHLORIDE 0.9 % IV SOLN
INTRAVENOUS | Status: DC | PRN
  Administered 2018-09-01: 15 ug/min via INTRAVENOUS

## 2018-09-01 MED ORDER — IOHEXOL 350 MG/ML SOLN
75.0000 mL | Freq: Once | INTRAVENOUS | Status: AC | PRN
  Administered 2018-09-01: 75 mL via INTRAVENOUS

## 2018-09-01 NOTE — Consult Note (Signed)
INR . Post treatment CT brain reveals no ICH or mass effect. Patient left intubated on account of nasal/mouth bleeding due to IVTPA. RT groin puncture site hemostasis with 64F angioseal closure device. RT groin soft with no hematoma. Distal pulses all dopplerable in both feet,unchanged. S.Breslin Hemann MD

## 2018-09-01 NOTE — ED Notes (Signed)
DEPARTED FOR IR W/ ANESTHESIA.

## 2018-09-01 NOTE — ED Provider Notes (Signed)
[facility] EMERGENCY DEPARTMENT Provider Note   CSN: [id number] Arrival date & time: 09/01/18  1913     History   Chief Complaint No chief complaint on file.   HPI Monica [name] is a 83 y.o. female.     Patient is an 83 year old female with a history of hypertension, hyperlipidemia, obesity, cardiomegaly and prior history of GI bleeding who presents today with EMS for acute right-sided weakness starting at 1815 witnessed by family.  Patient has no history of CVA and takes no anticoagulation.  She was her normal self today when acutely she suddenly was unable to move her right side.  EMS state when they arrived she could not stand or walk.  She had right-sided weakness and right-sided facial droop.  She has been aphasic since presentation.  However at 1905 she started having some mild hand grip on the right side but continues to have aphasia and facial droop.  The history is provided by the EMS personnel.    Past Medical History:  Diagnosis Date   Allergic rhinitis    Anxiety    Benign neoplasm of breast 2013   Cardiomegaly    Compulsive overeating    Depression    Diffuse cystic mastopathy 2013   Edema    GI bleeding    Gout    HTN (hypertension)    Hyperlipidemia    Lump or mass in breast 2012   OA (osteoarthritis) of knee    with injections   Obesity    Other abnormal glucose    Personal history of tobacco use, presenting hazards to health    Sciatica    Sinus infection 2010   Unspecified sleep apnea     Patient Active Problem List   Diagnosis Date Noted   Degenerative lumbar spinal stenosis 09/20/2017   Lumbar radiculopathy 07/31/2017   Lower GI bleed 10/03/2016   Diverticulosis of colon with hemorrhage    Status post bilateral knee replacements 05/04/2016   Chronic cough 04/15/2016   Seborrheic keratoses 04/15/2016   Cervical disc disorder with radiculopathy of cervical region 12/01/2015   Partial  nontraumatic rupture of right rotator cuff 09/22/2015   Compulsive overeating 01/01/2015   Chronic diastolic heart failure ([place]) 12/12/2012    Class: Chronic   Urge incontinence of urine 09/09/2011   Knee osteoarthritis 02/09/2010   Gout 03/20/2008   Hyperlipidemia 12/21/2006   Depression with anxiety 12/21/2006   Essential hypertension 12/21/2006   Obstructive sleep apnea 12/21/2006    Past Surgical History:  Procedure Laterality Date   adenosine cardiolite  1/08   low risk    APPENDECTOMY     BREAST LUMPECTOMY     right x1 ('89) left x2 ('70s, '90)   CARDIAC CATHETERIZATION  2001   normal per pt   COLONOSCOPY  11/02   diverticulosis   COLONOSCOPY  9/06   diverticulosis, hemorhoids   COLONOSCOPY N/A 10/01/2016   Procedure: COLONOSCOPY;  Surgeon: Gatha Mayer, MD;  Location: [facility];  Service: Endoscopy;  Laterality: N/A;   dexa  3/02   normal   EYE SURGERY  2012   cataract   knee replacement Right 9/11   R. Dr. Telford Nab   sleep study  5/05   TONSILLECTOMY     TOTAL ABDOMINAL HYSTERECTOMY     fibroids, age of 58   TOTAL KNEE ARTHROPLASTY Left 2012   US TRANSVAGINAL PELVIC MODIFIED  2000, 2001     OB History    Gravida  2  Para      Term      Preterm      AB  1   Living  1     SAB  1   TAB      Ectopic      Multiple      Live Births           Obstetric Comments  Age with first menstruation-14 Age with first pregnancy-22 LMP-age 23, hysterectomy         Home Medications    Prior to Admission medications   Medication Sig Start Date End Date Taking? Authorizing Provider  allopurinol (ZYLOPRIM) 100 MG tablet TAKE 2 TABLETS BY MOUTH ONCE A DAY *NEED OFFICE VISIT 01/03/17   Lyndal Pulley, DO  amLODipine (NORVASC) 5 MG tablet TAKE 1 TABLET (5 MG TOTAL) BY MOUTH DAILY. PLEASE KEEP UPCOMING APPOINTMENT FOR FURTHER REFILLS 11/21/17   Belva Crome, MD  CALCIUM PO Take 1 tablet by mouth daily.    [provider]  cyanocobalamin 1000 MCG tablet Take 1,000 mcg by mouth daily.    [provider]  DULoxetine (CYMBALTA) 30 MG capsule Take 30 mg by mouth daily. 10/23/17   [provider]  furosemide (LASIX) 40 MG tablet TAKE 1 TABLET BY MOUTH DAILY 10/26/16   Thersa Salt G, DO  MAGNESIUM CARBONATE PO Take 250 mg by mouth daily.    [provider]  metoprolol succinate (TOPROL-XL) 100 MG 24 hr tablet TAKE 1 TABLET BY MOUTH DAILY WITH OR IMMEDIATLY FOLLOWING A MEAL 06/06/16   Cook, Deering G, DO  Misc Natural Products (TART CHERRY ADVANCED PO) Take 1 capsule by mouth daily.    [provider]  potassium chloride SA (K-DUR,KLOR-CON) 20 MEQ tablet Take 1 tablet (20 mEq total) by mouth daily. 02/26/16   [name], DO  pyridOXINE (VITAMIN B-6) 100 MG tablet Take 100 mg by mouth daily.    [provider]  TURMERIC PO Take 1 tablet by mouth daily.    [provider]  Vitamin D, Ergocalciferol, (DRISDOL) 1.25 MG (50000 UT) CAPS capsule TAKE 1 CAPSULE (50,000 UNITS TOTAL) BY MOUTH EVERY 7 (SEVEN) DAYS. 01/22/18   Lyndal Pulley, DO    Family History Family History  Problem Relation Age of Onset   Stroke Father    Hypertension Mother        (a lot of animosity in their relationship)   Heart failure Mother    Gout Unknown        whole family    Prostate cancer Brother    Hepatitis Brother        Hep C; alcoholism (terminal)   Breast cancer Sister    Other Brother        drug addiction    Social History Social History   Tobacco Use   Smoking status: Former Smoker    Packs/day: 0.50    Years: 20.00    Pack years: 10.00    Types: Cigarettes   Smokeless tobacco: Never Used   Tobacco comment: quit approx. 20 years ago  Substance Use Topics   Alcohol use: Yes    Alcohol/week: 0.0 standard drinks    Comment: wine occasional   Drug use: No     Allergies   Buspirone hcl, Lisinopril, and Prozac [fluoxetine hcl]   Review of  Systems Review of Systems  Unable to perform ROS: Acuity of condition     Physical Exam Updated Vital Signs BP Marland Kitchen)  130/114    Pulse 77    Resp 13    Ht 5\' 7"  (1.702 m)    Wt 113.4 kg    SpO2 91%    BMI 39.16 kg/m   Physical Exam Vitals signs and nursing note reviewed.  Constitutional:      General: She is not in acute distress.    Appearance: She is well-developed. She is obese.  HENT:     Head: Normocephalic and atraumatic.  Eyes:     Pupils: Pupils are equal, round, and reactive to light.  Cardiovascular:     Rate and Rhythm: Normal rate and regular rhythm.     Heart sounds: Normal heart sounds. No murmur. No friction rub.  Pulmonary:     Effort: Pulmonary effort is normal.     Breath sounds: Normal breath sounds. No wheezing or rales.  Abdominal:     General: Bowel sounds are normal. There is no distension.     Palpations: Abdomen is soft.     Tenderness: There is no abdominal tenderness. There is no guarding or rebound.  Musculoskeletal: Normal range of motion.        General: No tenderness.     Comments: No edema  Skin:    General: Skin is warm and dry.     Findings: No rash.  Neurological:     Mental Status: She is alert.     Comments: Patient is awake and alert however when asked to smile she only opens her mouth.  She has expressive aphasia and slurred speech.  When asked her name she cannot do it.  She has right-sided facial droop and right-sided weakness in the right upper and lower extremity.  She does have mild hand grip.  No notable neglect.  Psychiatric:     Comments: Change in mental status with aphasia      ED Treatments / Results  Labs (all labs ordered are listed, but only abnormal results are displayed) Labs Reviewed  CBC - Abnormal; Notable for the following components:      Result Value   Hemoglobin 11.0 (*)    MCH 25.7 (*)    RDW 17.6 (*)    All other components within normal limits  COMPREHENSIVE METABOLIC PANEL - Abnormal; Notable for the  following components:   Glucose, Bld 106 (*)    BUN 27 (*)    Creatinine, Ser 1.17 (*)    GFR calc non Af Amer 44 (*)    GFR calc Af Amer 51 (*)    All other components within normal limits  I-STAT CHEM 8, ED - Abnormal; Notable for the following components:   BUN 29 (*)    Creatinine, Ser 1.10 (*)    Glucose, Bld 102 (*)    Hemoglobin 11.9 (*)    HCT 35.0 (*)    All other components within normal limits  CBG MONITORING, ED - Abnormal; Notable for the following components:   Glucose-Capillary 102 (*)    All other components within normal limits  SARS CORONAVIRUS 2 (HOSPITAL ORDER, [place] [facility])  PROTIME-INR  APTT  DIFFERENTIAL    EKG EKG Interpretation  Date/Time:  Saturday September 01 2018 19:55:10 EDT Ventricular Rate:  74 PR Interval:    QRS Duration: 145 QT Interval:  427 QTC Calculation: 474 R Axis:   53 Text Interpretation:  Sinus rhythm Probable left ventricular hypertrophy ST now depressed in Inferior leads Confirmed by Blanchie Dessert [phone]) on 09/01/2018 8:16:16 PM  Radiology Ct Angio Head W Or Wo Contrast  Result Date: 09/01/2018 CLINICAL DATA:  Code stroke focal neurologic deficit EXAM: CT ANGIOGRAPHY HEAD AND NECK TECHNIQUE: Multidetector CT imaging of the head and neck was performed using the standard protocol during bolus administration of intravenous contrast. Multiplanar CT image reconstructions and MIPs were obtained to evaluate the vascular anatomy. Carotid stenosis measurements (when applicable) are obtained utilizing NASCET criteria, using the distal internal carotid diameter as the denominator. CONTRAST:  64mL OMNIPAQUE IOHEXOL 350 MG/ML SOLN COMPARISON:  Head CT 09/01/2018 FINDINGS: CTA NECK FINDINGS SKELETON: There is no bony spinal canal stenosis. No lytic or blastic lesion. OTHER NECK: Normal pharynx, larynx and major salivary glands. No cervical lymphadenopathy. Unremarkable thyroid gland. UPPER CHEST: No pneumothorax or  pleural effusion. No nodules or masses. AORTIC ARCH: There is mild calcific atherosclerosis of the aortic arch. There is no aneurysm, dissection or hemodynamically significant stenosis of the visualized ascending aorta and aortic arch. Conventional 3 vessel aortic branching pattern. The visualized proximal subclavian arteries are widely patent. RIGHT CAROTID SYSTEM: --Common carotid artery: Widely patent origin without common carotid artery dissection or aneurysm. --Internal carotid artery: No dissection, occlusion or aneurysm. There is mixed density atherosclerosis extending into the proximal ICA, resulting in less than 50% stenosis. --External carotid artery: No acute abnormality. LEFT CAROTID SYSTEM: --Common carotid artery: Widely patent origin without common carotid artery dissection or aneurysm. --Internal carotid artery: No dissection, occlusion or aneurysm. There is mixed density atherosclerosis extending into the proximal ICA, resulting in less than 50% stenosis. --External carotid artery: No acute abnormality. VERTEBRAL ARTERIES: Right dominant configuration. Both origins are clearly patent. No dissection, occlusion or flow-limiting stenosis to the skull base (V1-V3 segments). CTA HEAD FINDINGS POSTERIOR CIRCULATION: --Vertebral arteries: Normal V4 segments. --Posterior inferior cerebellar arteries (PICA): Patent origins from the vertebral arteries. --Anterior inferior cerebellar arteries (AICA): Patent origins from the basilar artery. --Basilar artery: Normal. --Superior cerebellar arteries: Normal. --Posterior cerebral arteries (PCA): Normal. Both originate from the basilar artery. Posterior communicating arteries (p-comm) are diminutive or absent. ANTERIOR CIRCULATION: --Intracranial internal carotid arteries: Normal. --Anterior cerebral arteries (ACA): Normal. Both A1 segments are present. Patent anterior communicating artery (a-comm). --Middle cerebral arteries (MCA): There is occlusion of the distal  M1 segment of the left middle cerebral artery. There is intermediate collateralization. The right MCA is normal. VENOUS SINUSES: As permitted by contrast timing, patent. ANATOMIC VARIANTS: None Review of the MIP images confirms the above findings. IMPRESSION: 1. 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 2. Bilateral carotid bifurcation atherosclerosis with less than 50% stenosis by NASCET criteria. 3.  Aortic atherosclerosis (ICD10-I70.0). Critical Value/emergent results were called by telephone at the time of interpretation on 09/01/2018 at 7:48 pm to Dr. Samara Snide , who verbally acknowledged these results. Electronically Signed   By: Ulyses Jarred M.D.   On: 09/01/2018 19:48   Ct Angio Neck W Or Wo Contrast  Result Date: 09/01/2018 CLINICAL DATA:  Code stroke focal neurologic deficit EXAM: CT ANGIOGRAPHY HEAD AND NECK TECHNIQUE: Multidetector CT imaging of the head and neck was performed using the standard protocol during bolus administration of intravenous contrast. Multiplanar CT image reconstructions and MIPs were obtained to evaluate the vascular anatomy. Carotid stenosis measurements (when applicable) are obtained utilizing NASCET criteria, using the distal internal carotid diameter as the denominator. CONTRAST:  65mL OMNIPAQUE IOHEXOL 350 MG/ML SOLN COMPARISON:  Head CT 09/01/2018 FINDINGS: CTA NECK FINDINGS SKELETON: There is no  bony spinal canal stenosis. No lytic or blastic lesion. OTHER NECK: Normal pharynx, larynx and major salivary glands. No cervical lymphadenopathy. Unremarkable thyroid gland. UPPER CHEST: No pneumothorax or pleural effusion. No nodules or masses. AORTIC ARCH: There is mild calcific atherosclerosis of the aortic arch. There is no aneurysm, dissection or hemodynamically significant stenosis of the visualized ascending aorta and aortic arch. Conventional 3 vessel aortic branching pattern. The  visualized proximal subclavian arteries are widely patent. RIGHT CAROTID SYSTEM: --Common carotid artery: Widely patent origin without common carotid artery dissection or aneurysm. --Internal carotid artery: No dissection, occlusion or aneurysm. There is mixed density atherosclerosis extending into the proximal ICA, resulting in less than 50% stenosis. --External carotid artery: No acute abnormality. LEFT CAROTID SYSTEM: --Common carotid artery: Widely patent origin without common carotid artery dissection or aneurysm. --Internal carotid artery: No dissection, occlusion or aneurysm. There is mixed density atherosclerosis extending into the proximal ICA, resulting in less than 50% stenosis. --External carotid artery: No acute abnormality. VERTEBRAL ARTERIES: Right dominant configuration. Both origins are clearly patent. No dissection, occlusion or flow-limiting stenosis to the skull base (V1-V3 segments). CTA HEAD FINDINGS POSTERIOR CIRCULATION: --Vertebral arteries: Normal V4 segments. --Posterior inferior cerebellar arteries (PICA): Patent origins from the vertebral arteries. --Anterior inferior cerebellar arteries (AICA): Patent origins from the basilar artery. --Basilar artery: Normal. --Superior cerebellar arteries: Normal. --Posterior cerebral arteries (PCA): Normal. Both originate from the basilar artery. Posterior communicating arteries (p-comm) are diminutive or absent. ANTERIOR CIRCULATION: --Intracranial internal carotid arteries: Normal. --Anterior cerebral arteries (ACA): Normal. Both A1 segments are present. Patent anterior communicating artery (a-comm). --Middle cerebral arteries (MCA): There is occlusion of the distal M1 segment of the left middle cerebral artery. There is intermediate collateralization. The right MCA is normal. VENOUS SINUSES: As permitted by contrast timing, patent. ANATOMIC VARIANTS: None Review of the MIP images confirms the above findings. IMPRESSION: 1. Emergent large vessel  occlusion of the distal M1 segment of the left middle cerebral artery with intermediate collateralization. 2. Bilateral carotid bifurcation atherosclerosis with less than 50% stenosis by NASCET criteria. 3.  Aortic atherosclerosis (ICD10-I70.0). Critical Value/emergent results were called by telephone at the time of interpretation on 09/01/2018 at 7:48 pm to Dr. Samara Snide , who verbally acknowledged these results. Electronically Signed   By: Ulyses Jarred M.D.   On: 09/01/2018 19:48   Ct Head Code Stroke Wo Contrast  Result Date: 09/01/2018 CLINICAL DATA:  Code stroke. Focal neurologic deficit for less than 6 hours EXAM: CT HEAD WITHOUT CONTRAST TECHNIQUE: Contiguous axial images were obtained from the base of the skull through the vertex without intravenous contrast. COMPARISON:  None. FINDINGS: Brain: There is no mass, hemorrhage or extra-axial collection. The size and configuration of the ventricles and extra-axial CSF spaces are normal. There is mild white matter hypoattenuation. Partially empty sella is incidentally noted Vascular: No abnormal hyperdensity of the major intracranial arteries or dural venous sinuses. No intracranial atherosclerosis. Skull: The visualized skull base, calvarium and extracranial soft tissues are normal. Sinuses/Orbits: No fluid levels or advanced mucosal thickening of the visualized paranasal sinuses. No mastoid or middle ear effusion. The orbits are normal. ASPECTS [facility] Stroke Program Early CT Score) - Ganglionic level infarction (caudate, lentiform nuclei, internal capsule, insula, M1-M3 cortex): 7 - Supraganglionic infarction (M4-M6 cortex): 3 Total score (0-10 with 10 being normal): 10 IMPRESSION: 1. No acute intracranial abnormality. 2. ASPECTS is 10. These results were communicated to Dr. Karena Addison Aroor at 7:32 pm on 09/01/2018 by text page via the [facility] messaging  system. Electronically Signed   By: Ulyses Jarred M.D.   On: 09/01/2018 19:33     Procedures Procedures (including critical care time)  Medications Ordered in ED Medications  sodium chloride flush (NS) 0.9 % injection 3 mL (has no administration in time range)     Initial Impression / Assessment and Plan / ED Course  I have reviewed the triage vital signs and the nursing notes.  Pertinent labs & imaging results that were available during my care of the patient were reviewed by me and considered in my medical decision making (see chart for details).        Patient presents as a code stroke last seen normal at 1815 so approximately 1 hour prior to arrival.  Patient has LVO symptoms with right-sided weakness, aphasia and facial droop.  No notable neglect and mild improvement in symptoms since they started.  She has no history of anticoagulation use or prior stroke but does have a prior history of GI bleed.  Patient's blood pressure is 654 systolic and blood sugar is 99.  Stroke team was at bedside when patient arrived and went straight to the CT scanner as her airway is intact.  Imaging and labs are pending.   8:36 PM Patient's CT angios shows emerging large vessel occlusion of the distal M1 segment of the left middle cerebral artery with intermediate collateralization.  She also has bilateral carotid bifurcation atherosclerosis with less than 50% stenosis.  Patient was started on TPA and anesthesia intubated the patient and she will be going to IR.  She was given propofol for sedation.  Labs show a creatinine of 1.10 with normal sodium and glucose.  Coags are within normal limits CBC within normal limits.  CRITICAL CARE Performed by: Ansh Fauble Total critical care time: 30 minutes Critical care time was exclusive of separately billable procedures and treating other patients. Critical care was necessary to treat or prevent imminent or life-threatening deterioration. Critical care was time spent personally by me on the following activities: development of  treatment plan with patient and/or surrogate as well as nursing, discussions with consultants, evaluation of patient's response to treatment, examination of patient, obtaining history from patient or surrogate, ordering and performing treatments and interventions, ordering and review of laboratory studies, ordering and review of radiographic studies, pulse oximetry and re-evaluation of patient's condition.  Final Clinical Impressions(s) / ED Diagnoses   Final diagnoses:  Acute ischemic stroke [facility])    ED Discharge Orders    None       Blanchie Dessert, MD 09/01/18 2039

## 2018-09-01 NOTE — ED Notes (Signed)
Arrived in IR, bag changed over to saline bag, running at same rate of TPA to ensure pt receives entire infusion. Report Given to [employer]

## 2018-09-01 NOTE — Progress Notes (Addendum)
Pharmacist Code Stroke Response  Notified to mix tPA at [name] to RN at 1927  9 mg bolus 81 mg infusion   Monica [name] 09/01/18 7:28 PM

## 2018-09-01 NOTE — Anesthesia Procedure Notes (Signed)
Arterial Line Insertion Start/End7/12/2018 8:53 PM, 09/01/2018 8:56 PM Performed by: Barrington Ellison, CRNA, CRNA  Patient location: OOR procedure area. Emergency situation Left, radial was placed Catheter size: 18 G Maximum sterile barriers used   Attempts: 1 Procedure performed without using ultrasound guided technique. Following insertion, dressing applied and Biopatch. Post procedure assessment: normal  Patient tolerated the procedure with difficulty.

## 2018-09-01 NOTE — Procedures (Signed)
S/P LT common carotid arteriogram followed by complete revascularization of occluded Lt MCA M 1 seg with x 1 pass with 5 mm x 42mm embotrap retriever device ,x1 pass with 64mm x 40 mm solitaire X retriever device and x 1 pass using the the ADAPT technique with TICI 3 restoration

## 2018-09-01 NOTE — Consult Note (Signed)
NAME:  Monica [name], MRN:  [id number], DOB:  10/16/1935, LOS: 0 ADMISSION DATE:  09/01/2018, CONSULTATION DATE:  09/01/18 REFERRING MD:  Chauncey Cruel. Aroor, MD CHIEF COMPLAINT:  Vent management  Brief History   83 year old female admitted for right-sided facial and arm weakness and dysarthria s/p tPA and revascularization of occluded left MCA M1 segment. PCCM consulted for vent management.   History of present illness   83 year old female with pmhx as noted below who presented with right-sided facial and arm weakness and dysarthria. Patient unable to provide history. Records obtained by chart review. Patient was last seen normal at 6:15. Daughter called and noted dysarthria and unable to move the right side of her body. On arrival to ED, code stroke. Patient received tPA. CTA demonstrated left M1 occlusion and proceeded to IR for thrombectomy. Patient transferred to Neuro ICU s/p revascularization.  On arrival, patient remained intubated post-procedure due to nasal/oral bleeding in setting of tPA.  Past Medical History  HTN, HLD, obesity, hx GIB  Significant Hospital Events   09/01/18 Admitted  Consults:  Neuro PCCM  Procedures:  ETT 7/11 L arterial line 7/11 L MCA M1 revascularization 7/11 Significant Diagnostic Tests:  CTA - large occlusion of distal M1 segment of L MCA CT Head - no intracranial abnormality Micro Data:  SARS Covid 7/11 - negative  Antimicrobials:    Interim history/subjective:  As above  Objective   Blood pressure 108/62, pulse 65, temperature (!) 96.8 F (36 C), temperature source Oral, resp. rate (!) 21, height 5\' 7"  (1.702 m), weight 113.4 kg, SpO2 98 %.    Vent Mode: PRVC FiO2 (%):  [100 %] 100 % Set Rate:  [18 bmp] 18 bmp Vt Set:  [490 mL-500 mL] 490 mL PEEP:  [5 cmH20] 5 cmH20 Plateau Pressure:  [19 cmH20] 19 cmH20   Intake/Output Summary (Last 24 hours) at 09/01/2018 2329 Last data filed at 09/01/2018 2152 Gross per 24 hour  Intake -  Output 25 ml   Net -25 ml   Filed Weights   09/01/18 1900  Weight: 113.4 kg   Physical Exam: General: Obese female laying in bed on mechanical ventilation, sedated HENT: [place], AT, ETT in place Eyes: 49mm pupil equal and reactive, no scleral icterus Respiratory: Clear to auscultation bilaterally.  No crackles, wheezing or rales Cardiovascular: RRR, -M/R/G, no JVD GI: BS+, soft, nontender Extremities:-Edema,-tenderness Neuro: Sedated GU: External foley in place  Resolved Hospital Problem list     Assessment & Plan:  Acute hypoxemic respiratory failure-Full vent support. Reviewed ABG. Wean FIO2 and decrease RR. Repeat ABG in 1 hour. Plan for SBT in am  Left MCA occlusion s/p tPA and revascularization - Continue frequent neuro checks Imaging and labs per Neuro. Hold antiplatelet/anticoagulation x 24 hours. TTE ordered. Cardene gtt for SBP goal 120-140  HTN- Cardene gtt for SBP goal 120-140  Best practice:  Diet: NPO Pain/Anxiety/Delirium protocol (if indicated): Propofol and precedex VAP protocol (if indicated): Yes DVT prophylaxis: Holding in setting of recent tPA GI prophylaxis: PPI Glucose control: CBG q4h Mobility: Per PT/OT eval Code Status: Full Family Communication: Per primary team Disposition: Remain in ICU  Labs   CBC: Recent Labs  Lab 09/01/18 1915 09/01/18 1920 09/01/18 2308  WBC 7.5  --   --   NEUTROABS 4.2  --   --   HGB 11.0* 11.9* 8.8*  HCT 36.0 35.0* 26.0*  MCV 84.1  --   --   PLT 279  --   --  Basic Metabolic Panel: Recent Labs  Lab 09/01/18 1915 09/01/18 1920 09/01/18 2308  NA 141 141 143  K 4.2 4.1 3.2*  CL 105 106  --   CO2 26  --   --   GLUCOSE 106* 102*  --   BUN 27* 29*  --   CREATININE 1.17* 1.10*  --   CALCIUM 9.3  --   --    GFR: Estimated Creatinine Clearance: 52.1 mL/min (A) (by C-G formula based on SCr of 1.1 mg/dL (H)). Recent Labs  Lab 09/01/18 1915  WBC 7.5    Liver Function Tests: Recent Labs  Lab 09/01/18 1915  AST 22   ALT 16  ALKPHOS 99  BILITOT 0.5  PROT 6.8  ALBUMIN 3.6   No results for input(s): LIPASE, AMYLASE in the last 168 hours. No results for input(s): AMMONIA in the last 168 hours.  ABG    Component Value Date/Time   PHART 7.491 (H) 09/01/2018 2308   PCO2ART 30.4 (L) 09/01/2018 2308   PO2ART 422.0 (H) 09/01/2018 2308   HCO3 23.4 09/01/2018 2308   TCO2 24 09/01/2018 2308   O2SAT 100.0 09/01/2018 2308     Coagulation Profile: Recent Labs  Lab 09/01/18 1915  INR 1.0    Cardiac Enzymes: No results for input(s): CKTOTAL, CKMB, CKMBINDEX, TROPONINI in the last 168 hours.  HbA1C: Hgb A1c MFr Bld  Date/Time Value Ref Range Status  04/15/2016 11:11 AM 6.2 4.6 - 6.5 % Final    Comment:    Glycemic Control Guidelines for People with Diabetes:Non Diabetic:  <6%Goal of Therapy: <7%Additional Action Suggested:  >8%   12/31/2014 01:10 PM 6.1 4.6 - 6.5 % Final    Comment:    Glycemic Control Guidelines for People with Diabetes:Non Diabetic:  <6%Goal of Therapy: <7%Additional Action Suggested:  >8%     CBG: Recent Labs  Lab 09/01/18 1925  GLUCAP 102*    Review of Systems:   Unable to obtain due to intubated status  Past Medical History  She,  has a past medical history of Allergic rhinitis, Anxiety, Benign neoplasm of breast (2013), Cardiomegaly, Compulsive overeating, Depression, Diffuse cystic mastopathy (2013), Edema, GI bleeding, Gout, HTN (hypertension), Hyperlipidemia, Lump or mass in breast (2012), OA (osteoarthritis) of knee, Obesity, Other abnormal glucose, Personal history of tobacco use, presenting hazards to health, Sciatica, Sinus infection (2010), and Unspecified sleep apnea.   Surgical History    Past Surgical History:  Procedure Laterality Date  . adenosine cardiolite  1/08   low risk   . APPENDECTOMY    . BREAST LUMPECTOMY     right x1 ('89) left x2 ('70s, '90)  . CARDIAC CATHETERIZATION  2001   normal per pt  . COLONOSCOPY  11/02   diverticulosis  .  COLONOSCOPY  9/06   diverticulosis, hemorhoids  . COLONOSCOPY N/A 10/01/2016   Procedure: COLONOSCOPY;  Surgeon: Gatha Mayer, MD;  Location: [facility];  Service: Endoscopy;  Laterality: N/A;  . dexa  3/02   normal  . EYE SURGERY  2012   cataract  . knee replacement Right 9/11   R. Dr. Telford Nab  . sleep study  5/05  . TONSILLECTOMY    . TOTAL ABDOMINAL HYSTERECTOMY     fibroids, age of 26  . TOTAL KNEE ARTHROPLASTY Left 2012  . US TRANSVAGINAL PELVIC MODIFIED  2000, 2001     Social History   reports that she has quit smoking. Her smoking use included cigarettes. She has a 10.00 pack-year smoking  history. She has never used smokeless tobacco. She reports current alcohol use. She reports that she does not use drugs.   Family History   Her family history includes Breast cancer in her sister; Gout in an other family member; Heart failure in her mother; Hepatitis in her brother; Hypertension in her mother; Other in her brother; Prostate cancer in her brother; Stroke in her father.   Allergies Allergies  Allergen Reactions  . Buspirone Hcl     REACTION: made her sleepy, ? swollen legs  . Lisinopril     REACTION: dizziness  . Prozac [Fluoxetine Hcl]     sleepy     Home Medications  Prior to Admission medications   Medication Sig Start Date End Date Taking? Authorizing Provider  allopurinol (ZYLOPRIM) 100 MG tablet TAKE 2 TABLETS BY MOUTH ONCE A DAY *NEED OFFICE VISIT 01/03/17   Lyndal Pulley, DO  amLODipine (NORVASC) 5 MG tablet TAKE 1 TABLET (5 MG TOTAL) BY MOUTH DAILY. PLEASE KEEP UPCOMING APPOINTMENT FOR FURTHER REFILLS 11/21/17   Belva Crome, MD  CALCIUM PO Take 1 tablet by mouth daily.    [provider]  cyanocobalamin 1000 MCG tablet Take 1,000 mcg by mouth daily.    [provider]  DULoxetine (CYMBALTA) 30 MG capsule Take 30 mg by mouth daily. 10/23/17   [provider]  furosemide (LASIX) 40 MG tablet TAKE 1 TABLET BY MOUTH DAILY 10/26/16    Thersa Salt G, DO  MAGNESIUM CARBONATE PO Take 250 mg by mouth daily.    [provider]  metoprolol succinate (TOPROL-XL) 100 MG 24 hr tablet TAKE 1 TABLET BY MOUTH DAILY WITH OR IMMEDIATLY FOLLOWING A MEAL 06/06/16   Cook, [place] G, DO  Misc Natural Products (TART CHERRY ADVANCED PO) Take 1 capsule by mouth daily.    [provider]  potassium chloride SA (K-DUR,KLOR-CON) 20 MEQ tablet Take 1 tablet (20 mEq total) by mouth daily. 02/26/16   [name], DO  pyridOXINE (VITAMIN B-6) 100 MG tablet Take 100 mg by mouth daily.    [provider]  TURMERIC PO Take 1 tablet by mouth daily.    [provider]  Vitamin D, Ergocalciferol, (DRISDOL) 1.25 MG (50000 UT) CAPS capsule TAKE 1 CAPSULE (50,000 UNITS TOTAL) BY MOUTH EVERY 7 (SEVEN) DAYS. 01/22/18   Lyndal Pulley, DO     Critical care time: 35 min    The patient is critically ill with multiple organ systems failure and requires high complexity decision making for assessment and support, frequent evaluation and titration of therapies, application of advanced monitoring technologies and extensive interpretation of multiple databases.   Critical Care Time devoted to patient care services described in this note is 35 Minutes. This time reflects time of care of this signee Dr. Rodman Pickle. This critical care time does not reflect procedure time, or teaching time or supervisory time of PA/NP/Med student/Med Resident etc but could involve care discussion time.  Rodman Pickle, M.D. [facility] Pulmonary/Critical Care Medicine Pager: [phone] After hours pager: [phone]

## 2018-09-01 NOTE — H&P (Addendum)
Chief Complaint: Was not speaking and weak on the right side  History obtained from: Patient's daughter  HPI:                                                                                                                                       Monica [name] is a 83 y.o. female with past medical history of hypertension, hyperlipidemia, obesity, remote GI bleeding presents to the emergency room as a code stroke for sudden onset aphasia and right hemiparesis.  Last seen normal 6:15 PM, patient was having her tree trimmed when she suddenly developed difficulty speaking.  Patient's daughter was called who lives in the same neighborhood and saw her mother was not speaking and not moving her right side EMS was called and patient was brought in as a code stroke.  Her exam had improved in route and when she arrived she no longer had the right-sided weakness but remained aphasic with right facial droop.  NIH stroke scale was 8 on assessment.  CT head showed no hemorrhage and patient received IV TPA.  CT angiogram was performed which showed a left M1 occlusion.  Risk versus benefit was discussed with the daughter and we proceeded to IR for mechanical thrombectomy.  Patient lives independently at home and is very active.  She had remote history of GI bleed about 3 years ago.  Date last known well: 09/01/2018 Time last known well: 6.15 p.m. tPA Given: Yes NIHSS: 8 Baseline MRS 0    Past Medical History:  Diagnosis Date  . Allergic rhinitis   . Anxiety   . Benign neoplasm of breast 2013  . Cardiomegaly   . Compulsive overeating   . Depression   . Diffuse cystic mastopathy 2013  . Edema   . GI bleeding   . Gout   . HTN (hypertension)   . Hyperlipidemia   . Lump or mass in breast 2012  . OA (osteoarthritis) of knee    with injections  . Obesity   . Other abnormal glucose   . Personal history of tobacco use, presenting hazards to health   . Sciatica   . Sinus infection 2010  .  Unspecified sleep apnea     Past Surgical History:  Procedure Laterality Date  . adenosine cardiolite  1/08   low risk   . APPENDECTOMY    . BREAST LUMPECTOMY     right x1 ('89) left x2 ('70s, '90)  . CARDIAC CATHETERIZATION  2001   normal per pt  . COLONOSCOPY  11/02   diverticulosis  . COLONOSCOPY  9/06   diverticulosis, hemorhoids  . COLONOSCOPY N/A 10/01/2016   Procedure: COLONOSCOPY;  Surgeon: Gatha Mayer, MD;  Location: [facility];  Service: Endoscopy;  Laterality: N/A;  . dexa  3/02   normal  . EYE SURGERY  2012   cataract  . knee replacement Right 9/11  R. Dr. Telford Nab  . sleep study  5/05  . TONSILLECTOMY    . TOTAL ABDOMINAL HYSTERECTOMY     fibroids, age of 38  . TOTAL KNEE ARTHROPLASTY Left 2012  . US TRANSVAGINAL PELVIC MODIFIED  2000, 2001    Family History  Problem Relation Age of Onset  . Stroke Father   . Hypertension Mother        (a lot of animosity in their relationship)  . Heart failure Mother   . Gout Other        whole family   . Prostate cancer Brother   . Hepatitis Brother        Hep C; alcoholism (terminal)  . Breast cancer Sister   . Other Brother        drug addiction   Social History:  reports that she has quit smoking. Her smoking use included cigarettes. She has a 10.00 pack-year smoking history. She has never used smokeless tobacco. She reports current alcohol use. She reports that she does not use drugs.  Allergies:  Allergies  Allergen Reactions  . Buspirone Hcl     REACTION: made her sleepy, ? swollen legs  . Lisinopril     REACTION: dizziness  . Prozac [Fluoxetine Hcl]     sleepy    Medications:                                                                                                                        I reviewed home medications   ROS:                                                                                                                                     14 systems reviewed and negative  except above    Examination:                                                                                                      General: Appears well-developed  Psych:  Affect appropriate to situation Eyes: No scleral injection HENT: No OP obstrucion Head: Normocephalic.  Cardiovascular: Normal rate and regular rhythm.  Respiratory: Effort normal and breath sounds normal to anterior ascultation GI: Soft.  No distension. There is no tenderness.  Skin: WDI    Neurological Examination Mental Status: Alert, but unable to answer simple questions such as her age or month.  Identified her name.  Unable to repeat her name objects.  Also could not follow commands Cranial Nerves: II: Visual fields : Difficult to assess but no reduced blink to threat in either eye III,IV, VI: ptosis not present, extra-ocular motions intact bilaterally, pupils equal, round, reactive to light and accommodation V,VII: smile symmetric, facial light touch sensation normal bilaterally XII: midline tongue extension Motor: Right : Upper extremity   5/5    Left:     Upper extremity   5/5  Lower extremity   5/5     Lower extremity   5/5 Tone and bulk:normal tone throughout; no atrophy noted Sensory: Withdraws to pain on both sides equally Plantars: Right: downgoing   Left: downgoing Cerebellar: normal finger-to-nose, normal rapid alternating movements and normal heel-to-shin test      Lab Results: Basic Metabolic Panel: Recent Labs  Lab 09/01/18 1915 09/01/18 1920 09/01/18 2308  NA 141 141 143  K 4.2 4.1 3.2*  CL 105 106  --   CO2 26  --   --   GLUCOSE 106* 102*  --   BUN 27* 29*  --   CREATININE 1.17* 1.10*  --   CALCIUM 9.3  --   --     CBC: Recent Labs  Lab 09/01/18 1915 09/01/18 1920 09/01/18 2308  WBC 7.5  --   --   NEUTROABS 4.2  --   --   HGB 11.0* 11.9* 8.8*  HCT 36.0 35.0* 26.0*  MCV 84.1  --   --   PLT 279  --   --     Coagulation Studies: Recent Labs    09/01/18 1915   LABPROT 13.0  INR 1.0    Imaging: Ct Angio Head W Or Wo Contrast  Result Date: 09/01/2018 CLINICAL DATA:  Code stroke focal neurologic deficit EXAM: CT ANGIOGRAPHY HEAD AND NECK TECHNIQUE: Multidetector CT imaging of the head and neck was performed using the standard protocol during bolus administration of intravenous contrast. Multiplanar CT image reconstructions and MIPs were obtained to evaluate the vascular anatomy. Carotid stenosis measurements (when applicable) are obtained utilizing NASCET criteria, using the distal internal carotid diameter as the denominator. CONTRAST:  30mL OMNIPAQUE IOHEXOL 350 MG/ML SOLN COMPARISON:  Head CT 09/01/2018 FINDINGS: CTA NECK FINDINGS SKELETON: There is no bony spinal canal stenosis. No lytic or blastic lesion. OTHER NECK: Normal pharynx, larynx and major salivary glands. No cervical lymphadenopathy. Unremarkable thyroid gland. UPPER CHEST: No pneumothorax or pleural effusion. No nodules or masses. AORTIC ARCH: There is mild calcific atherosclerosis of the aortic arch. There is no aneurysm, dissection or hemodynamically significant stenosis of the visualized ascending aorta and aortic arch. Conventional 3 vessel aortic branching pattern. The visualized proximal subclavian arteries are widely patent. RIGHT CAROTID SYSTEM: --Common carotid artery: Widely patent origin without common carotid artery dissection or aneurysm. --Internal carotid artery: No dissection, occlusion or aneurysm. There is mixed density atherosclerosis extending into the proximal ICA, resulting in less than 50% stenosis. --External carotid artery: No acute abnormality. LEFT CAROTID SYSTEM: --Common carotid artery: Widely patent origin without common carotid artery dissection or aneurysm. --Internal carotid artery: No  dissection, occlusion or aneurysm. There is mixed density atherosclerosis extending into the proximal ICA, resulting in less than 50% stenosis. --External carotid artery: No acute  abnormality. VERTEBRAL ARTERIES: Right dominant configuration. Both origins are clearly patent. No dissection, occlusion or flow-limiting stenosis to the skull base (V1-V3 segments). CTA HEAD FINDINGS POSTERIOR CIRCULATION: --Vertebral arteries: Normal V4 segments. --Posterior inferior cerebellar arteries (PICA): Patent origins from the vertebral arteries. --Anterior inferior cerebellar arteries (AICA): Patent origins from the basilar artery. --Basilar artery: Normal. --Superior cerebellar arteries: Normal. --Posterior cerebral arteries (PCA): Normal. Both originate from the basilar artery. Posterior communicating arteries (p-comm) are diminutive or absent. ANTERIOR CIRCULATION: --Intracranial internal carotid arteries: Normal. --Anterior cerebral arteries (ACA): Normal. Both A1 segments are present. Patent anterior communicating artery (a-comm). --Middle cerebral arteries (MCA): There is occlusion of the distal M1 segment of the left middle cerebral artery. There is intermediate collateralization. The right MCA is normal. VENOUS SINUSES: As permitted by contrast timing, patent. ANATOMIC VARIANTS: None Review of the MIP images confirms the above findings. IMPRESSION: 1. 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 2. Bilateral carotid bifurcation atherosclerosis with less than 50% stenosis by NASCET criteria. 3.  Aortic atherosclerosis (ICD10-I70.0). Critical Value/emergent results were called by telephone at the time of interpretation on 09/01/2018 at 7:48 pm to Dr. Samara Snide , who verbally acknowledged these results. Electronically Signed   By: Ulyses Jarred M.D.   On: 09/01/2018 19:48   Ct Angio Neck W Or Wo Contrast  Result Date: 09/01/2018 CLINICAL DATA:  Code stroke focal neurologic deficit EXAM: CT ANGIOGRAPHY HEAD AND NECK TECHNIQUE: Multidetector CT imaging of the head and neck was performed using the standard  protocol during bolus administration of intravenous contrast. Multiplanar CT image reconstructions and MIPs were obtained to evaluate the vascular anatomy. Carotid stenosis measurements (when applicable) are obtained utilizing NASCET criteria, using the distal internal carotid diameter as the denominator. CONTRAST:  83mL OMNIPAQUE IOHEXOL 350 MG/ML SOLN COMPARISON:  Head CT 09/01/2018 FINDINGS: CTA NECK FINDINGS SKELETON: There is no bony spinal canal stenosis. No lytic or blastic lesion. OTHER NECK: Normal pharynx, larynx and major salivary glands. No cervical lymphadenopathy. Unremarkable thyroid gland. UPPER CHEST: No pneumothorax or pleural effusion. No nodules or masses. AORTIC ARCH: There is mild calcific atherosclerosis of the aortic arch. There is no aneurysm, dissection or hemodynamically significant stenosis of the visualized ascending aorta and aortic arch. Conventional 3 vessel aortic branching pattern. The visualized proximal subclavian arteries are widely patent. RIGHT CAROTID SYSTEM: --Common carotid artery: Widely patent origin without common carotid artery dissection or aneurysm. --Internal carotid artery: No dissection, occlusion or aneurysm. There is mixed density atherosclerosis extending into the proximal ICA, resulting in less than 50% stenosis. --External carotid artery: No acute abnormality. LEFT CAROTID SYSTEM: --Common carotid artery: Widely patent origin without common carotid artery dissection or aneurysm. --Internal carotid artery: No dissection, occlusion or aneurysm. There is mixed density atherosclerosis extending into the proximal ICA, resulting in less than 50% stenosis. --External carotid artery: No acute abnormality. VERTEBRAL ARTERIES: Right dominant configuration. Both origins are clearly patent. No dissection, occlusion or flow-limiting stenosis to the skull base (V1-V3 segments). CTA HEAD FINDINGS POSTERIOR CIRCULATION: --Vertebral arteries: Normal V4 segments. --Posterior  inferior cerebellar arteries (PICA): Patent origins from the vertebral arteries. --Anterior inferior cerebellar arteries (AICA): Patent origins from the basilar artery. --Basilar artery: Normal. --Superior cerebellar arteries: Normal. --Posterior cerebral arteries (PCA): Normal. Both originate from the basilar artery. Posterior  communicating arteries (p-comm) are diminutive or absent. ANTERIOR CIRCULATION: --Intracranial internal carotid arteries: Normal. --Anterior cerebral arteries (ACA): Normal. Both A1 segments are present. Patent anterior communicating artery (a-comm). --Middle cerebral arteries (MCA): There is occlusion of the distal M1 segment of the left middle cerebral artery. There is intermediate collateralization. The right MCA is normal. VENOUS SINUSES: As permitted by contrast timing, patent. ANATOMIC VARIANTS: None Review of the MIP images confirms the above findings. IMPRESSION: 1. 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 2. Bilateral carotid bifurcation atherosclerosis with less than 50% stenosis by NASCET criteria. 3.  Aortic atherosclerosis (ICD10-I70.0). Critical Value/emergent results were called by telephone at the time of interpretation on 09/01/2018 at 7:48 pm to Dr. Samara Snide , who verbally acknowledged these results. Electronically Signed   By: Ulyses Jarred M.D.   On: 09/01/2018 19:48   Ct Head Code Stroke Wo Contrast  Result Date: 09/01/2018 CLINICAL DATA:  Code stroke. Focal neurologic deficit for less than 6 hours EXAM: CT HEAD WITHOUT CONTRAST TECHNIQUE: Contiguous axial images were obtained from the base of the skull through the vertex without intravenous contrast. COMPARISON:  None. FINDINGS: Brain: There is no mass, hemorrhage or extra-axial collection. The size and configuration of the ventricles and extra-axial CSF spaces are normal. There is mild white matter hypoattenuation. Partially  empty sella is incidentally noted Vascular: No abnormal hyperdensity of the major intracranial arteries or dural venous sinuses. No intracranial atherosclerosis. Skull: The visualized skull base, calvarium and extracranial soft tissues are normal. Sinuses/Orbits: No fluid levels or advanced mucosal thickening of the visualized paranasal sinuses. No mastoid or middle ear effusion. The orbits are normal. ASPECTS [facility] Stroke Program Early CT Score) - Ganglionic level infarction (caudate, lentiform nuclei, internal capsule, insula, M1-M3 cortex): 7 - Supraganglionic infarction (M4-M6 cortex): 3 Total score (0-10 with 10 being normal): 10 IMPRESSION: 1. No acute intracranial abnormality. 2. ASPECTS is 10. These results were communicated to Dr. Karena Addison  at 7:32 pm on 09/01/2018 by text page via the [facility] messaging system. Electronically Signed   By: Ulyses Jarred M.D.   On: 09/01/2018 19:33     ASSESSMENT AND PLAN  83 year old female with history of hypertension hyperlipidemia obesity presents with aphasia and right hemiparesis secondary to left MCA stroke.  CT head was negative for hemorrhage with aspects of 10 and patient received IV TPA after discussion with daughter.  CT angiogram revealed a left M1 occlusion, although patient had improved NIH stroke scale still 8, therefore decision was made to take patient for mechanical thrombectomy. Patient underwent successful mechanical thrombectomy with 3 passes and adapt technique achieving TICI 3 restoration.  Patient remained intubated due to concern for oropharyngeal bleeding.    Acute ischemic stroke secondary to left M1 occlusion status post TPA and IV thrombectomy with TICI 3 recanalization   Recommend # MRI of the brain without contrast #Transthoracic Echo  #Hold antiplatelets until 24 hours post TPA #Start or continue Atorvastatin 40 mg/other high intensity statin after patient extubated # BP goal: 1 28-3 40 systolic per neuro IR goals  #  HBAIC and Lipid profile # Telemetry monitoring # Frequent neuro checks # stroke swallow screen after extubation   Respiratory insufficiency -Intubated for mechanical thrombectomy -Patient remains intubated due to oropharyngeal bleeding status post TPA -Appreciate PCCM assistance  Hypertension a-BP goals as stated above -Cardene drip ordered  Hyperlipidemia -Statin when patient passes swallow evaluation    [facility] Pager  Number [phone]    This patient is neurologically critically ill due to left MCA stroke status post TPA and mechanical thrombectomy he is at risk for significant risk of neurological worsening from cerebral edema,  death from brain herniation, heart failure, hemorrhagic conversion, infection, respiratory failure and seizure. This patient's care requires constant monitoring of vital signs, hemodynamics, respiratory and cardiac monitoring, review of multiple databases, neurological assessment, discussion with family, other specialists and medical decision making of high complexity.  I spent 70  minutes of neurocritical time in the care of this patient.

## 2018-09-01 NOTE — Progress Notes (Signed)
Patient transported from IR to 5T70 with no complications.

## 2018-09-01 NOTE — Progress Notes (Signed)
RT obtained ABG on pt from [name] on settings of PRVC VT 500, rate 18, FIO2 100% PEEP of 5 with the following results. RT decreased rate to 15 and placed pt on her 8cc VT 490 based on ABG results. RT will continue to monitor.   Results for Monica [name], Monica [name] (MRN [id number]) as of 09/01/2018 23:43  Ref. Range 09/01/2018 23:08  pH, Arterial Latest Ref Range: 7.350 - 7.450  7.491 (H)  pCO2 arterial Latest Ref Range: 32.0 - 48.0 mmHg 30.4 (L)  pO2, Arterial Latest Ref Range: 83.0 - 108.0 mmHg 422.0 (H)  TCO2 Latest Ref Range: 22 - 32 mmol/L 24  Bicarbonate Latest Ref Range: 20.0 - 28.0 mmol/L 23.4  O2 Saturation Latest Units: % 100.0  Patient temperature Unknown 96.8 F  Collection site Unknown ARTERIAL LINE

## 2018-09-01 NOTE — Progress Notes (Signed)
Patient intubated and transported from ED room 015 to IR without complication.

## 2018-09-01 NOTE — Sedation Documentation (Signed)
SBAR given to [employer], Therapist, sports. Groin and pulses assess at bedside, see flowsheet.

## 2018-09-01 NOTE — Sedation Documentation (Signed)
Spoke with [name], pt transport, requested 4N19 be brought to IR2.

## 2018-09-01 NOTE — Progress Notes (Signed)
Patient ID: Monica [name], female   DOB: [birth year], 83 y.o.   MRN: [id number] INR. [place].  Acute onset of RT sided weakness and. aphasia. CT BRAIN NO ICH .ASPECTS 10  CTA occluded Lt MCA M 1 seg . MRSS0.  GIven IVtpa. REndovascular treatment of LT MCA occlusion D/W daughter. Reasons,risks and alternatives reviewed. Risks of ICH of 10 %,worsening neur function,death,inability bto revascularize and vascular trauma discussed. Daughter provided informed witnessed consent to proceed with the treatment. S.Lina Hitch MD

## 2018-09-01 NOTE — Anesthesia Procedure Notes (Signed)
Procedure Name: Intubation Date/Time: 09/01/2018 8:18 PM Performed by: Jearld Pies, CRNA Pre-anesthesia Checklist: Patient identified, Emergency Drugs available, Suction available and Patient being monitored Patient Re-evaluated:Patient Re-evaluated prior to induction Oxygen Delivery Method: Circle System Utilized Preoxygenation: Pre-oxygenation with 100% oxygen Induction Type: IV induction Laryngoscope Size: Glidescope and 4 Grade View: Grade I Tube type: Oral Tube size: 8.0 mm Number of attempts: 1 Airway Equipment and Method: Stylet and Oral airway Placement Confirmation: ETT inserted through vocal cords under direct vision,  positive ETCO2 and breath sounds checked- equal and bilateral Secured at: 22 cm Tube secured with: Tape Dental Injury: Teeth and Oropharynx as per pre-operative assessment  Comments: Performed by Fransisco Beau MD

## 2018-09-01 NOTE — Sedation Documentation (Signed)
Spoke with Anderson Malta, RN, 4N Charge, pt assigned to [phone]. States I can send for bed and will place monitor and O2 tank on bed.

## 2018-09-01 NOTE — Anesthesia Preprocedure Evaluation (Addendum)
Anesthesia Evaluation  Patient identified by MRN, date of birth, ID band Patient confused    Reviewed: Allergy & Precautions, NPO status , Patient's Chart, lab work & pertinent test results, Unable to perform ROS - Chart review onlyPreop documentation limited or incomplete due to emergent nature of procedure.  Airway Mallampati: III  TM Distance: >3 FB Neck ROM: Full    Dental  (+) Dental Advisory Given, Teeth Intact   Pulmonary sleep apnea , former smoker,    breath sounds clear to auscultation       Cardiovascular hypertension, Pt. on medications and Pt. on home beta blockers  Rhythm:Regular Rate:Normal     Neuro/Psych PSYCHIATRIC DISORDERS Anxiety Depression CVA, Residual Symptoms    GI/Hepatic   Endo/Other  Morbid obesity  Renal/GU Renal InsufficiencyRenal disease     Musculoskeletal  (+) Arthritis , Osteoarthritis,   Gout    Abdominal (+) + obese,   Peds  Hematology  (+) anemia ,   Anesthesia Other Findings   Reproductive/Obstetrics                             Anesthesia Physical Anesthesia Plan  ASA: III and emergent  Anesthesia Plan: General   Post-op Pain Management:    Induction: Intravenous and Rapid sequence  PONV Risk Score and Plan: 3 and Treatment may vary due to age or medical condition and Ondansetron  Airway Management Planned: Oral ETT and Video Laryngoscope Planned  Additional Equipment: Arterial line  Intra-op Plan:   Post-operative Plan: Possible Post-op intubation/ventilation  Informed Consent: I have reviewed the patients History and Physical, chart, labs and discussed the procedure including the risks, benefits and alternatives for the proposed anesthesia with the patient or authorized representative who has indicated his/her understanding and acceptance.     Dental advisory given, Consent reviewed with POA and Only emergency history available  Plan  Discussed with: CRNA and Anesthesiologist  Anesthesia Plan Comments: ( Consented daughter at bedside)       Anesthesia Quick Evaluation

## 2018-09-01 NOTE — ED Triage Notes (Signed)
Pt BIB [facility] for eval of stroke like symptoms. At 1815, pt noted to have sudden onset dysarthria/aphasia w/ R sided facial droop, R arm grip decreased, R arm weakness/drift. Activated as code stroke by EMS en route, LVO+. Pt alert on arrival, obvious facial droop w/ dysarthria

## 2018-09-01 NOTE — ED Notes (Signed)
Anesthesia at bedside, preparing for intubation.

## 2018-09-01 NOTE — Transfer of Care (Signed)
Immediate Anesthesia Transfer of Care Note  Patient: Monica [name]  Procedure(s) Performed: IR WITH ANESTHESIA (N/A )  Patient Location: ICU  Anesthesia Type:General  Level of Consciousness: Patient remains intubated per anesthesia plan  Airway & Oxygen Therapy: Patient remains intubated per anesthesia plan and Patient placed on Ventilator (see vital sign flow sheet for setting)  Post-op Assessment: Report given to RN and Post -op Vital signs reviewed and stable  Post vital signs: Reviewed and stable  Last Vitals:  Vitals Value Taken Time  BP 149/61   Temp    Pulse 67   Resp 18 09/01/18 2237  SpO2 100   Vitals shown include unvalidated device data.  Last Pain:  Vitals:   09/01/18 2230  TempSrc: Oral  PainSc:      Report to [place] in 4N. Neo gtt weaned off. Propofol gtt remains at 75 mcg/kg/min, RT at bedside and during transport, PRVC 500 ml RR 18, 5 PEEP, 100% FiO2, ventilation confirmed, bilateral breath sounds.     Complications: No apparent anesthesia complications

## 2018-09-01 NOTE — Sedation Documentation (Signed)
Spoke with [facility] in pt placement. Has bed request, but no assignment at this time.

## 2018-09-02 ENCOUNTER — Inpatient Hospital Stay (HOSPITAL_COMMUNITY): Payer: Medicare Other

## 2018-09-02 DIAGNOSIS — I63512 Cerebral infarction due to unspecified occlusion or stenosis of left middle cerebral artery: Secondary | ICD-10-CM

## 2018-09-02 DIAGNOSIS — I63412 Cerebral infarction due to embolism of left middle cerebral artery: Principal | ICD-10-CM

## 2018-09-02 DIAGNOSIS — J9601 Acute respiratory failure with hypoxia: Secondary | ICD-10-CM

## 2018-09-02 DIAGNOSIS — Z8719 Personal history of other diseases of the digestive system: Secondary | ICD-10-CM

## 2018-09-02 DIAGNOSIS — I639 Cerebral infarction, unspecified: Secondary | ICD-10-CM

## 2018-09-02 DIAGNOSIS — D62 Acute posthemorrhagic anemia: Secondary | ICD-10-CM

## 2018-09-02 LAB — GLUCOSE, CAPILLARY
Glucose-Capillary: 100 mg/dL — ABNORMAL HIGH (ref 70–99)
Glucose-Capillary: 124 mg/dL — ABNORMAL HIGH (ref 70–99)
Glucose-Capillary: 81 mg/dL (ref 70–99)
Glucose-Capillary: 86 mg/dL (ref 70–99)
Glucose-Capillary: 87 mg/dL (ref 70–99)
Glucose-Capillary: 95 mg/dL (ref 70–99)
Glucose-Capillary: 96 mg/dL (ref 70–99)

## 2018-09-02 LAB — CBC WITH DIFFERENTIAL/PLATELET
Abs Immature Granulocytes: 0.03 10*3/uL (ref 0.00–0.07)
Basophils Absolute: 0 10*3/uL (ref 0.0–0.1)
Basophils Relative: 0 %
Eosinophils Absolute: 0 10*3/uL (ref 0.0–0.5)
Eosinophils Relative: 0 %
HCT: 29.1 % — ABNORMAL LOW (ref 36.0–46.0)
Hemoglobin: 9.1 g/dL — ABNORMAL LOW (ref 12.0–15.0)
Immature Granulocytes: 0 %
Lymphocytes Relative: 18 %
Lymphs Abs: 1.3 10*3/uL (ref 0.7–4.0)
MCH: 25.6 pg — ABNORMAL LOW (ref 26.0–34.0)
MCHC: 31.3 g/dL (ref 30.0–36.0)
MCV: 82 fL (ref 80.0–100.0)
Monocytes Absolute: 0.5 10*3/uL (ref 0.1–1.0)
Monocytes Relative: 7 %
Neutro Abs: 5.6 10*3/uL (ref 1.7–7.7)
Neutrophils Relative %: 75 %
Platelets: 247 10*3/uL (ref 150–400)
RBC: 3.55 MIL/uL — ABNORMAL LOW (ref 3.87–5.11)
RDW: 17.7 % — ABNORMAL HIGH (ref 11.5–15.5)
WBC: 7.5 10*3/uL (ref 4.0–10.5)
nRBC: 0 % (ref 0.0–0.2)

## 2018-09-02 LAB — POCT I-STAT 7, (LYTES, BLD GAS, ICA,H+H)
Acid-base deficit: 1 mmol/L (ref 0.0–2.0)
Bicarbonate: 22.7 mmol/L (ref 20.0–28.0)
Calcium, Ion: 1.25 mmol/L (ref 1.15–1.40)
HCT: 29 % — ABNORMAL LOW (ref 36.0–46.0)
Hemoglobin: 9.9 g/dL — ABNORMAL LOW (ref 12.0–15.0)
O2 Saturation: 99 %
Patient temperature: 97.7
Potassium: 3.6 mmol/L (ref 3.5–5.1)
Sodium: 141 mmol/L (ref 135–145)
TCO2: 24 mmol/L (ref 22–32)
pCO2 arterial: 33.3 mmHg (ref 32.0–48.0)
pH, Arterial: 7.44 (ref 7.350–7.450)
pO2, Arterial: 137 mmHg — ABNORMAL HIGH (ref 83.0–108.0)

## 2018-09-02 LAB — ABO/RH: ABO/RH(D): O POS

## 2018-09-02 LAB — LIPID PANEL
Cholesterol: 153 mg/dL (ref 0–200)
HDL: 31 mg/dL — ABNORMAL LOW (ref >40)
LDL Cholesterol: 66 mg/dL (ref 0–99)
Total CHOL/HDL Ratio: 4.9 RATIO
Triglycerides: 278 mg/dL — ABNORMAL HIGH (ref <150)
VLDL: 56 mg/dL — ABNORMAL HIGH (ref 0–40)

## 2018-09-02 LAB — ECHOCARDIOGRAM COMPLETE
Height: 67 in
Weight: 4000.03 oz

## 2018-09-02 LAB — BASIC METABOLIC PANEL
Anion gap: 9 (ref 5–15)
BUN: 26 mg/dL — ABNORMAL HIGH (ref 8–23)
CO2: 21 mmol/L — ABNORMAL LOW (ref 22–32)
Calcium: 8.8 mg/dL — ABNORMAL LOW (ref 8.9–10.3)
Chloride: 109 mmol/L (ref 98–111)
Creatinine, Ser: 0.93 mg/dL (ref 0.44–1.00)
GFR calc Af Amer: >60 mL/min (ref >60)
GFR calc non Af Amer: 58 mL/min — ABNORMAL LOW (ref >60)
Glucose, Bld: 106 mg/dL — ABNORMAL HIGH (ref 70–99)
Potassium: 3.8 mmol/L (ref 3.5–5.1)
Sodium: 139 mmol/L (ref 135–145)

## 2018-09-02 LAB — MRSA PCR SCREENING: MRSA by PCR: NEGATIVE

## 2018-09-02 LAB — HEMOGLOBIN A1C
Hgb A1c MFr Bld: 6.2 % — ABNORMAL HIGH (ref 4.8–5.6)
Mean Plasma Glucose: 131.24 mg/dL

## 2018-09-02 LAB — TRIGLYCERIDES: Triglycerides: 276 mg/dL — ABNORMAL HIGH (ref <150)

## 2018-09-02 MED ORDER — PANTOPRAZOLE SODIUM 40 MG PO TBEC
40.0000 mg | DELAYED_RELEASE_TABLET | Freq: Every day | ORAL | Status: DC
  Administered 2018-09-03 – 2018-09-05 (×3): 40 mg via ORAL
  Filled 2018-09-02 (×3): qty 1

## 2018-09-02 MED ORDER — HYDRALAZINE HCL 20 MG/ML IJ SOLN
10.0000 mg | INTRAMUSCULAR | Status: DC | PRN
  Administered 2018-09-02: 10 mg via INTRAVENOUS
  Filled 2018-09-02: qty 1

## 2018-09-02 MED ORDER — FENTANYL 2500MCG IN NS 250ML (10MCG/ML) PREMIX INFUSION
0.0000 ug/h | INTRAVENOUS | Status: DC
  Administered 2018-09-02: 25 ug/h via INTRAVENOUS
  Filled 2018-09-02: qty 250

## 2018-09-02 MED ORDER — HYDRALAZINE HCL 20 MG/ML IJ SOLN: 10.0000 mg | INTRAMUSCULAR | Status: DC | PRN

## 2018-09-02 MED ORDER — VITAMIN B-12 1000 MCG PO TABS
1000.0000 ug | ORAL_TABLET | Freq: Every day | ORAL | Status: DC
  Administered 2018-09-03 – 2018-09-05 (×3): 1000 ug via ORAL
  Filled 2018-09-02 (×3): qty 1

## 2018-09-02 MED ORDER — VITAMIN B-6 50 MG PO TABS
100.0000 mg | ORAL_TABLET | Freq: Every day | ORAL | Status: DC
  Administered 2018-09-03 – 2018-09-05 (×3): 100 mg via ORAL
  Filled 2018-09-02 (×3): qty 2

## 2018-09-02 MED ORDER — APIXABAN 5 MG PO TABS: 5.0000 mg | ORAL_TABLET | Freq: Two times a day (BID) | ORAL | Status: DC

## 2018-09-02 MED ORDER — ALLOPURINOL 100 MG PO TABS
200.0000 mg | ORAL_TABLET | Freq: Every day | ORAL | Status: DC
  Administered 2018-09-03 – 2018-09-05 (×3): 200 mg via ORAL
  Filled 2018-09-02 (×3): qty 2

## 2018-09-02 MED ORDER — INSULIN ASPART 100 UNIT/ML ~~LOC~~ SOLN
0.0000 [IU] | Freq: Three times a day (TID) | SUBCUTANEOUS | Status: DC
  Administered 2018-09-05: 1 [IU] via SUBCUTANEOUS

## 2018-09-02 MED ORDER — APIXABAN 5 MG PO TABS
10.0000 mg | ORAL_TABLET | Freq: Two times a day (BID) | ORAL | Status: DC
  Administered 2018-09-02 – 2018-09-05 (×6): 10 mg via ORAL
  Filled 2018-09-02 (×6): qty 2

## 2018-09-02 NOTE — Progress Notes (Signed)
STROKE TEAM PROGRESS NOTE   SUBJECTIVE (INTERVAL HISTORY) Her family is not at the bedside.  Pt was just extubated and doing well. Awake alert still has partial expressive aphasia, but following all commands. LE venous doppler showed b/l DVT.     OBJECTIVE Vitals:   09/02/18 0530 09/02/18 0600 09/02/18 0630 09/02/18 0700  BP: (!) 100/45 (!) 116/50 (!) 126/55 (!) 111/48  Pulse: 67 (!) 52  (!) 54  Resp: 13 15 13 15   Temp:      TempSrc:      SpO2: 100% 100%  100%  Weight:      Height:        CBC:  Recent Labs  Lab 09/01/18 1915  09/02/18 0421 09/02/18 0506  WBC 7.5  --   --  7.5  NEUTROABS 4.2  --   --  5.6  HGB 11.0*   < > 9.9* 9.1*  HCT 36.0   < > 29.0* 29.1*  MCV 84.1  --   --  82.0  PLT 279  --   --  247   < > = values in this interval not displayed.    Basic Metabolic Panel:  Recent Labs  Lab 09/01/18 1915 09/01/18 1920  09/02/18 0421 09/02/18 0506  NA 141 141   < > 141 139  K 4.2 4.1   < > 3.6 3.8  CL 105 106  --   --  109  CO2 26  --   --   --  21*  GLUCOSE 106* 102*  --   --  106*  BUN 27* 29*  --   --  26*  CREATININE 1.17* 1.10*  --   --  0.93  CALCIUM 9.3  --   --   --  8.8*   < > = values in this interval not displayed.    Lipid Panel:     Component Value Date/Time   CHOL 153 09/02/2018 0506   TRIG 278 (H) 09/02/2018 0506   TRIG 276 (H) 09/02/2018 0506   HDL 31 (L) 09/02/2018 0506   CHOLHDL 4.9 09/02/2018 0506   VLDL 56 (H) 09/02/2018 0506   LDLCALC 66 09/02/2018 0506   HgbA1c:  Lab Results  Component Value Date   HGBA1C 6.2 (H) 09/02/2018   Urine Drug Screen: No results found for: LABOPIA, COCAINSCRNUR, LABBENZ, AMPHETMU, THCU, LABBARB  Alcohol Level No results found for: ETH  IMAGING  Ct Angio Head W Or Wo Contrast  Ct Angio Neck W Or Wo Contrast 09/01/2018 IMPRESSION:  1. 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  2. Bilateral carotid  bifurcation atherosclerosis with less than 50% stenosis by NASCET criteria.  3.  Aortic atherosclerosis (ICD10-I70.0).   Dg Chest Port 1 View 09/01/2018 IMPRESSION: Endotracheal tube in satisfactory position. No acute infiltrate is noted.   Ct Head Code Stroke Wo Contrast 09/01/2018 IMPRESSION:  1. No acute intracranial abnormality.  2. ASPECTS is 10.  Transthoracic Echocardiogram   1. The left ventricle has hyperdynamic systolic function, with an ejection fraction of >65%. The cavity size was normal. There is mildly increased left ventricular wall thickness. Left ventricular diastolic Doppler parameters are consistent with  pseudonormalization.  2. The right ventricle has normal systolic function. The cavity was normal. There is no increase in right ventricular wall thickness. Right ventricular systolic pressure could not be assessed.  3. Left atrial size was mild-moderately dilated.  4. The mitral valve  is abnormal. There is moderate mitral annular calcification present.  5. No stenosis of the aortic valve.  6. Likely calcified, prominent Coumadin ridge (normal variant landmark between LA appendage and left upper pulmonary vein). There is a 0.8 x 1.4 cm calcified mass like lesion adherent to the lateral LA wall, in the location of the Coumadin ridge (left  lateral ridge). It is non mobile, and likely represents calcified normal variant structure.  7. No intracardiac thrombi or masses were visualized.   EKG - SR rate 74 BPM. (See cardiology reading for complete details)   PHYSICAL EXAM  Temp:  [96.8 F (36 C)-98.2 F (36.8 C)] 97.9 F (36.6 C) (07/12 0800) Pulse Rate:  [52-89] 71 (07/12 1000) Resp:  [11-28] 18 (07/12 1000) BP: (91-160)/(44-114) 140/58 (07/12 0947) SpO2:  [91 %-100 %] 99 % (07/12 1000) Arterial Line BP: (108-147)/(43-74) 139/54 (07/12 1000) FiO2 (%):  [40 %-100 %] 40 % (07/12 0947) Weight:  [113.4 kg] 113.4 kg (07/11 1900)  General - Well nourished, well  developed, in no apparent distress.  Ophthalmologic - fundi not visualized due to noncooperation.  Cardiovascular - Regular rate and rhythm.  Mental Status -  Awake alert but difficulty with orientation questions due to aphasia. Partial expressive aphasia, intermittent word salad and paraphasic errors and word finding difficulty. Following all simple commands, able to name 1/4 and then started perseveration. Able to repeat words but not sentences.   Cranial Nerves II - XII - II - Visual field intact OU. III, IV, VI - Extraocular movements intact. V - Facial sensation intact bilaterally. VII - mild right nasolabial fold flattening. VIII - Hearing & vestibular intact bilaterally. X - Palate elevates symmetrically. XI - Chin turning & shoulder shrug intact bilaterally. XII - Tongue protrusion intact.  Motor Strength - The patient's strength was 4/5 in all extremities and pronator drift was absent.  Bulk was normal and fasciculations were absent.   Motor Tone - Muscle tone was assessed at the neck and appendages and was normal.  Reflexes - The patient's reflexes were symmetrical in all extremities and she had no pathological reflexes.  Sensory - Light touch, temperature/pinprick were assessed and were symmetrical.    Coordination - The patient had normal movements in the hands with no ataxia or dysmetria.  Tremor was absent.  Gait and Station - deferred.   ASSESSMENT/PLAN Ms. Waylon Koffler is a 83 y.o. female with history of hypertension, hyperlipidemia, obesity, remote GI bleeding presenting with sudden onset aphasia and right hemiparesis. tPA given Saturday 09/01/18 @ 1945  Stroke:  Left  MCA punctate small infarct due to left M1 occlusion s/p IR with TICI3 reperfusion - embolic - source unknown paradoxical emboli with bilateral DVT versus occult A. fib  Resultant mild expressive aphasia  CT head - No acute intracranial abnormality.   MRI head - small punctate left MCA  infarcts  CTA H&N - occlusion of the distal M1 with intermediate collateralization.  Bilateral ICA bulb atherosclerosis.  Right VA origin stenosis  2D Echo - EF > 65%  LE venous Doppler showed right PTV, left popliteal, and b/l peroneal vein acute DVT  TCD bubble study pending  Sars Corona Virus 2  - negative  LDL - 66  HgbA1c - 6.2  VTE prophylaxis - SCDs  Diet - NPO  No antithrombotic prior to admission, now on No antithrombotic s/p tPA.  Consider DOAC for DVT treatment in the setting of stroke.  Patient will be counseled to be compliant with her  antithrombotic medications  Ongoing aggressive stroke risk factor management  Therapy recommendations:  pending  Disposition:  Pending  Bilateral DVT  LE venous Doppler showed right PTV, left popliteal, and b/l peroneal vein acute DVT  TCD bubble study pending  We will start [place] for treatment  May consider life long anticoagulation given unprovoked bilateral DVT  Hypertension  Stable . Permissive hypertension (OK if < 180/105) but gradually normalize in 3-5 days . Long-term BP goal normotensive  Other Stroke Risk Factors  Advanced age  Former cigarette smoker - quit  ETOH use, advised to drink no more than 1 alcoholic beverage per day.  Obesity, Body mass index is 39.16 kg/m., recommend weight loss, diet and exercise as appropriate   Family hx stroke (father)  Obstructive sleep apnea  Other Active Problems  Acute blood loss anemia - Hb -11.9->9.9->9.1  Remote history of GI bleeding 3 years ago  Hospital day # 1  This patient is critically ill due to stroke, bilateral DVT, status post TPA and mechanical thrombectomy and at significant risk of neurological worsening, death form recurrent stroke, hemorrhagic conversion, seizure, PE, heart failure. This patient's care requires constant monitoring of vital signs, hemodynamics, respiratory and cardiac monitoring, review of multiple databases, neurological  assessment, discussion with family, other specialists and medical decision making of high complexity. I spent 35 minutes of neurocritical care time in the care of this patient.  Rosalin Hawking, MD PhD Stroke Neurology 09/02/2018 7:38 PM   To contact Stroke Continuity provider, please refer to [email or web address] After hours, contact General Neurology

## 2018-09-02 NOTE — Progress Notes (Signed)
PT Cancellation Note  Patient Details Name: Monica [name] MRN: [id number] DOB: 06/05/1935   Cancelled Treatment:    Reason Eval/Treat Not Completed: Active bedrest order(will check back after 8 am)   Duncan Dull 09/02/2018, 6:40 AM

## 2018-09-02 NOTE — Progress Notes (Signed)
eLink Physician-Brief Progress Note Patient Name: Monica [name] DOB: 1935-03-23 MRN: [id number]   Date of Service  09/02/2018  HPI/Events of Note  Informed by IR attending of admission.   83/F presenting with right sided arm weakness, dysarthia, s/p tPA and thrombectomy of occluded left MCA M1 segment.  Pt will be kept intubated and admitted to the ICU for vent management.  Pt apparently had nasal bleeding in setting of tpA.  ABG 7.491/30.4/422 BP 139/55, HR 77.  eICU Interventions  Titrate FiO2.   Cardene gtt as needed for BP goal 120-140.  SCDs for DVT prophylaxis.  Protonix for DVT prophylaxis.     Intervention Category Evaluation Type: New Patient Evaluation  Elsie Lincoln 09/02/2018, 12:40 AM

## 2018-09-02 NOTE — Progress Notes (Signed)
  Echocardiogram 2D Echocardiogram has been performed.  Monica [name] 09/02/2018, 9:56 AM

## 2018-09-02 NOTE — Progress Notes (Signed)
OT Cancellation Note  Patient Details Name: Monica [name] MRN: [id number] DOB: 1935-08-11   Cancelled Treatment:    Reason Eval/Treat Not Completed: Active bedrest order;Other (comment)(intubated)  [place], OTR/L Acute Rehab Services Pager [phone] Office [phone]     Almon Register 09/02/2018, 9:01 AM

## 2018-09-02 NOTE — Anesthesia Postprocedure Evaluation (Signed)
Anesthesia Post Note  Patient: Monica [name]  Procedure(s) Performed: IR WITH ANESTHESIA (N/A )     Patient location during evaluation: ICU Anesthesia Type: General Level of consciousness: sedated and patient remains intubated per anesthesia plan Pain management: pain level controlled Vital Signs Assessment: post-procedure vital signs reviewed and stable Respiratory status: patient remains intubated per anesthesia plan Cardiovascular status: stable Postop Assessment: no apparent nausea or vomiting Anesthetic complications: no    Last Vitals:  Vitals:   09/02/18 0600 09/02/18 0630  BP: (!) 116/50 (!) 126/55  Pulse: (!) 52   Resp: 15 13  Temp:    SpO2: 100%     Last Pain:  Vitals:   09/02/18 0400  TempSrc: Oral  PainSc:                  Audry Pili

## 2018-09-02 NOTE — Consult Note (Signed)
NAME:  Monica [name], MRN:  [id number], DOB:  1935-05-23, LOS: 1 ADMISSION DATE:  09/01/2018, CONSULTATION DATE:  09/01/18 REFERRING MD:  Chauncey Cruel. Aroor, MD CHIEF COMPLAINT:  Vent management  Brief History   83 year old female admitted for right-sided facial and arm weakness and dysarthria s/p tPA and revascularization of occluded left MCA M1 segment. PCCM consulted for vent management.   History of present illness   83 year old female with pmhx as noted below who presented with right-sided facial and arm weakness and dysarthria. Patient unable to provide history. Records obtained by chart review. Patient was last seen normal at 6:15. Daughter called and noted dysarthria and unable to move the right side of her body. On arrival to ED, code stroke. Patient received tPA. CTA demonstrated left M1 occlusion and proceeded to IR for thrombectomy. Patient transferred to Neuro ICU s/p revascularization.  On arrival, patient remained intubated post-procedure due to nasal/oral bleeding in setting of tPA.  Past Medical History  HTN, HLD, obesity, hx GIB  Significant Hospital Events   09/01/18 Admitted  Consults:  Neuro PCCM  Procedures:  ETT 7/11 L arterial line 7/11 L MCA M1 revascularization 7/11 Significant Diagnostic Tests:  CTA - large occlusion of distal M1 segment of L MCA CT Head - no intracranial abnormality Echo -  Micro Data:  SARS Covid 7/11 - negative  Antimicrobials:  none  Interim history/subjective:  Patient awake and following commands this morning.  Objective   Blood pressure (!) 140/58, pulse 71, temperature 97.9 F (36.6 C), temperature source Axillary, resp. rate 18, height 5\' 7"  (1.702 m), weight 113.4 kg, SpO2 99 %.    Vent Mode: PSV;CPAP FiO2 (%):  [40 %-100 %] 40 % Set Rate:  [15 bmp-18 bmp] 15 bmp Vt Set:  [490 mL-500 mL] 490 mL PEEP:  [5 cmH20] 5 cmH20 Pressure Support:  [8 cmH20] 8 cmH20 Plateau Pressure:  [19 cmH20-23 cmH20] 19 cmH20   Intake/Output  Summary (Last 24 hours) at 09/02/2018 1111 Last data filed at 09/02/2018 0800 Gross per 24 hour  Intake 885.21 ml  Output 25 ml  Net 860.21 ml   Filed Weights   09/01/18 1900  Weight: 113.4 kg   Physical Exam: General: Obese female laying in bed on mechanical ventilation, sedated HENT: [place], AT, ETT in place Eyes: 41mm pupil equal and reactive, no scleral icterus Respiratory: Clear to auscultation bilaterally.  No crackles, wheezing or rales Cardiovascular: RRR, -M/R/G, no JVD GI: BS+, soft, nontender Extremities:-Edema,-tenderness Neuro: Somnolent but able to follow commands briskly in all four limbs. GU: External foley in place  Resolved Hospital Problem list     Assessment & Plan:  Critically ill due to acute hypoxemic respiratory failure Successfully extubated this am following 97min SBT -remains at high risk for reintubation -progressive ambulation -incentive spirometry  Critically ill due to Left MCA occlusion s/p tPA and revascularization requiring titration of antihypertensive agents to prevent hemorrhagic conversion.  -Continue frequent neuro checks -Hold antiplatelet/anticoagulation x 24 hours.  -Cardene gtt for SBP goal 120-140 - initiate secondary stroke prevention - initiate stroke rehabilitation starting with swallow evaluation   Best practice:  Diet: NPO pending swallow evaluation. Pain/Anxiety/Delirium protocol (if indicated): none VAP protocol (if indicated): Yes DVT prophylaxis: Holding in setting of recent tPA. Resume chemical prophylaxis at 24h mark. GI prophylaxis: PPI Glucose control: CBG q4h Mobility: Per PT/OT eval Code Status: Full Family Communication: Per primary team Disposition: Remain in ICU  Labs   CBC: Recent Labs  Lab  09/01/18 1915 09/01/18 1920 09/01/18 2308 09/02/18 0421 09/02/18 0506  WBC 7.5  --   --   --  7.5  NEUTROABS 4.2  --   --   --  5.6  HGB 11.0* 11.9* 8.8* 9.9* 9.1*  HCT 36.0 35.0* 26.0* 29.0* 29.1*  MCV 84.1   --   --   --  82.0  PLT 279  --   --   --  619    Basic Metabolic Panel: Recent Labs  Lab 09/01/18 1915 09/01/18 1920 09/01/18 2308 09/02/18 0421 09/02/18 0506  NA 141 141 143 141 139  K 4.2 4.1 3.2* 3.6 3.8  CL 105 106  --   --  109  CO2 26  --   --   --  21*  GLUCOSE 106* 102*  --   --  106*  BUN 27* 29*  --   --  26*  CREATININE 1.17* 1.10*  --   --  0.93  CALCIUM 9.3  --   --   --  8.8*   GFR: Estimated Creatinine Clearance: 61.6 mL/min (by C-G formula based on SCr of 0.93 mg/dL). Recent Labs  Lab 09/01/18 1915 09/02/18 0506  WBC 7.5 7.5    Liver Function Tests: Recent Labs  Lab 09/01/18 1915  AST 22  ALT 16  ALKPHOS 99  BILITOT 0.5  PROT 6.8  ALBUMIN 3.6   No results for input(s): LIPASE, AMYLASE in the last 168 hours. No results for input(s): AMMONIA in the last 168 hours.  ABG    Component Value Date/Time   PHART 7.440 09/02/2018 0421   PCO2ART 33.3 09/02/2018 0421   PO2ART 137.0 (H) 09/02/2018 0421   HCO3 22.7 09/02/2018 0421   TCO2 24 09/02/2018 0421   ACIDBASEDEF 1.0 09/02/2018 0421   O2SAT 99.0 09/02/2018 0421     Coagulation Profile: Recent Labs  Lab 09/01/18 1915  INR 1.0    Cardiac Enzymes: No results for input(s): CKTOTAL, CKMB, CKMBINDEX, TROPONINI in the last 168 hours.  HbA1C: Hgb A1c MFr Bld  Date/Time Value Ref Range Status  09/02/2018 05:06 AM 6.2 (H) 4.8 - 5.6 % Final    Comment:    (NOTE) Pre diabetes:          5.7%-6.4% Diabetes:              >6.4% Glycemic control for   <7.0% adults with diabetes   04/15/2016 11:11 AM 6.2 4.6 - 6.5 % Final    Comment:    Glycemic Control Guidelines for People with Diabetes:Non Diabetic:  <6%Goal of Therapy: <7%Additional Action Suggested:  >8%     CBG: Recent Labs  Lab 09/01/18 1925 09/02/18 0028 09/02/18 0307 09/02/18 0740  GLUCAP 102* 95 96 86   CRITICAL CARE Performed by: Kipp Brood   Total critical care time: 40 minutes  Critical care time was exclusive of  separately billable procedures and treating other patients.  Critical care was necessary to treat or prevent imminent or life-threatening deterioration.  Critical care was time spent personally by me on the following activities: development of treatment plan with patient and/or surrogate as well as nursing, discussions with consultants, evaluation of patient's response to treatment, examination of patient, obtaining history from patient or surrogate, ordering and performing treatments and interventions, ordering and review of laboratory studies, ordering and review of radiographic studies, pulse oximetry, re-evaluation of patient's condition and participation in multidisciplinary rounds.  Kipp Brood, MD [facility] ICU Physician [place]  Pager: [phone] Mobile: [phone] After hours: [phone].

## 2018-09-02 NOTE — Procedures (Signed)
Extubation Procedure Note  Patient Details:   Name: Abeni Finchum DOB: 09/09/1935 MRN: [id number]   Airway Documentation:  Airway 8 mm (Active)  Secured at (cm) 22 cm 09/02/18 0820  Measured From Lips 09/02/18 [place] 09/02/18 0820  Secured By [employer] 09/02/18 0820  Tube Holder Repositioned Yes 09/02/18 0820  Cuff Pressure (cm H2O) 26 cm H2O 09/02/18 0820  Site Condition Dry 09/02/18 0820   Vent end date: (not recorded) Vent end time: (not recorded)   Evaluation  O2 sats: stable throughout Complications: No apparent complications Patient did tolerate procedure well. Bilateral Breath Sounds: Clear, Diminished   Yes   Pt extubated per MD order.  Pt placed on 4L [place].  RN at bedside. RT will continue to monitor.  Pierre Bali 09/02/2018, 10:38 AM

## 2018-09-02 NOTE — Progress Notes (Signed)
SLP Cancellation Note  Patient Details Name: Monica [name] MRN: [id number] DOB: [birth year]   Cancelled treatment:       Reason Eval/Treat Not Completed: Patient not medically ready   Ares Cardozo, Katherene Ponto 09/02/2018, 7:50 AM

## 2018-09-02 NOTE — Progress Notes (Signed)
RT obtained repeat ABG on pt after ventilator changes made with the following results on VT 450, rate 15, FIO2 40% and PEEP of 5. No further changes at this time. MD aware. RT will continue to monitor.   Results for ESABELLA, STOCKINGER (MRN [id number]) as of 09/02/2018 04:45  Ref. Range 09/02/2018 04:21  Sample type Unknown ARTERIAL  pH, Arterial Latest Ref Range: 7.350 - 7.450  7.440  pCO2 arterial Latest Ref Range: 32.0 - 48.0 mmHg 33.3  pO2, Arterial Latest Ref Range: 83.0 - 108.0 mmHg 137.0 (H)  TCO2 Latest Ref Range: 22 - 32 mmol/L 24  Acid-base deficit Latest Ref Range: 0.0 - 2.0 mmol/L 1.0  Bicarbonate Latest Ref Range: 20.0 - 28.0 mmol/L 22.7  O2 Saturation Latest Units: % 99.0  Patient temperature Unknown 97.7 F  Collection site Unknown ARTERIAL LINE

## 2018-09-02 NOTE — Progress Notes (Signed)
ANTICOAGULATION CONSULT NOTE - Initial Consult  Pharmacy Consult for apixaban Indication: DVT  Allergies  Allergen Reactions  . Buspirone Hcl Other (See Comments)    REACTION: made her sleepy, ? swollen legs  . Lisinopril Other (See Comments)    REACTION: dizziness  . Prozac [Fluoxetine Hcl] Other (See Comments)    sleepy    Patient Measurements: Height: 5\' 7"  (170.2 cm) Weight: 250 lb (113.4 kg) IBW/kg (Calculated) : 61.6   Vital Signs: Temp: 98.1 F (36.7 C) (07/12 1945) Temp Source: Oral (07/12 1945) BP: 160/74 (07/12 2000) Pulse Rate: 100 (07/12 2000)  Labs: Recent Labs    09/01/18 1915 09/01/18 1920 09/01/18 2308 09/02/18 0421 09/02/18 0506  HGB 11.0* 11.9* 8.8* 9.9* 9.1*  HCT 36.0 35.0* 26.0* 29.0* 29.1*  PLT 279  --   --   --  247  APTT 30  --   --   --   --   LABPROT 13.0  --   --   --   --   INR 1.0  --   --   --   --   CREATININE 1.17* 1.10*  --   --  0.93    Estimated Creatinine Clearance: 61.6 mL/min (by C-G formula based on SCr of 0.93 mg/dL).   Medical History: Past Medical History:  Diagnosis Date  . Allergic rhinitis   . Anxiety   . Benign neoplasm of breast 2013  . Cardiomegaly   . Compulsive overeating   . Depression   . Diffuse cystic mastopathy 2013  . Edema   . GI bleeding   . Gout   . HTN (hypertension)   . Hyperlipidemia   . Lump or mass in breast 2012  . OA (osteoarthritis) of knee    with injections  . Obesity   . Other abnormal glucose   . Personal history of tobacco use, presenting hazards to health   . Sciatica   . Sinus infection 2010  . Unspecified sleep apnea     Medications:  Facility-Administered Medications Prior to Admission  Medication Dose Route Frequency Provider Last Rate Last Dose  . triamcinolone acetonide (KENALOG) 10 MG/ML injection 10 mg  10 mg Other Once Wallene Huh, DPM       Medications Prior to Admission  Medication Sig Dispense Refill Last Dose  . allopurinol (ZYLOPRIM) 100 MG tablet  TAKE 2 TABLETS BY MOUTH ONCE A DAY *NEED OFFICE VISIT (Patient taking differently: Take 200 mg by mouth daily. ) 60 tablet 0 08/31/2018  . amLODipine (NORVASC) 5 MG tablet TAKE 1 TABLET (5 MG TOTAL) BY MOUTH DAILY. PLEASE KEEP UPCOMING APPOINTMENT FOR FURTHER REFILLS (Patient taking differently: Take 5 mg by mouth daily. ) 30 tablet 10 08/31/2018  . colchicine 0.6 MG tablet Take 0.6 mg by mouth daily as needed (gout attacks).   few months ago  . docusate sodium (COLACE) 100 MG capsule Take 100 mg by mouth 2 (two) times daily as needed for mild constipation.   Past Week at Unknown time  . DULoxetine (CYMBALTA) 30 MG capsule Take 30 mg by mouth daily.   08/31/2018  . furosemide (LASIX) 40 MG tablet TAKE 1 TABLET BY MOUTH DAILY (Patient taking differently: Take 40 mg by mouth daily. ) 30 tablet 1 08/31/2018  . Magnesium 250 MG TABS Take 250 mg by mouth daily.   7/10?  Marland Kitchen meloxicam (MOBIC) 15 MG tablet Take 15 mg by mouth daily.   08/31/2018  . metoprolol succinate (TOPROL-XL) 100 MG 24  hr tablet TAKE 1 TABLET BY MOUTH DAILY WITH OR IMMEDIATLY FOLLOWING A MEAL (Patient taking differently: Take 100 mg by mouth daily. ) 30 tablet 5 08/31/2018  . pantoprazole (PROTONIX) 40 MG tablet Take 40 mg by mouth daily before breakfast.    08/31/2018  . potassium chloride SA (K-DUR,KLOR-CON) 20 MEQ tablet Take 1 tablet (20 mEq total) by mouth daily. 30 tablet 11 08/31/2018  . Vitamin D, Ergocalciferol, (DRISDOL) 1.25 MG (50000 UT) CAPS capsule TAKE 1 CAPSULE (50,000 UNITS TOTAL) BY MOUTH EVERY 7 (SEVEN) DAYS. (Patient not taking: Reported on 09/02/2018) 12 capsule 0 Not Taking at unknown    Assessment: 83 yo female with CVA and new DVT. She is s/p alteplase (09/01/18 at ~ 7:30pm) and carotid arteriogram with revascularization. Pharmacy consulted to dose apixaban for VTE.   Spoke to Dr. Lorraine Lax to discuss timing of initiation with acute CVA and tPA. Apixaban to restart tonight (24 hours post tPA).   Goal of Therapy:  Monitor  platelets by anticoagulation protocol: Yes   Plan:  -apixaban 10mg  po bid for 7 days followed by 5mg  po bid -Will follow patient progress  Hildred Laser, PharmD Clinical Pharmacist **Pharmacist phone directory can now be found on [email or web address] ([email or web address]).  Listed under [place].

## 2018-09-02 NOTE — Progress Notes (Signed)
VASCULAR LAB PRELIMINARY  PRELIMINARY  PRELIMINARY  PRELIMINARY  Bilateral lower extremity venous duplex completed.    Preliminary report:  See CV proc for preliminary results.   Gave results to Dr. Erlinda Hong and to My Janina Mayo, RN  Sharion Dove, RVT 09/02/2018, 6:06 PM

## 2018-09-02 NOTE — Progress Notes (Signed)
Wasted 2000 mcg/252ml of Fentanyl in sink with Amy S., RN

## 2018-09-03 ENCOUNTER — Encounter (HOSPITAL_COMMUNITY): Payer: Self-pay | Admitting: Interventional Radiology

## 2018-09-03 ENCOUNTER — Other Ambulatory Visit (HOSPITAL_COMMUNITY): Payer: Medicare Other

## 2018-09-03 DIAGNOSIS — E1149 Type 2 diabetes mellitus with other diabetic neurological complication: Secondary | ICD-10-CM

## 2018-09-03 DIAGNOSIS — E785 Hyperlipidemia, unspecified: Secondary | ICD-10-CM

## 2018-09-03 DIAGNOSIS — I471 Supraventricular tachycardia: Secondary | ICD-10-CM

## 2018-09-03 LAB — CBC
HCT: 29.7 % — ABNORMAL LOW (ref 36.0–46.0)
Hemoglobin: 9 g/dL — ABNORMAL LOW (ref 12.0–15.0)
MCH: 25.4 pg — ABNORMAL LOW (ref 26.0–34.0)
MCHC: 30.3 g/dL (ref 30.0–36.0)
MCV: 83.7 fL (ref 80.0–100.0)
Platelets: 255 10*3/uL (ref 150–400)
RBC: 3.55 MIL/uL — ABNORMAL LOW (ref 3.87–5.11)
RDW: 17.8 % — ABNORMAL HIGH (ref 11.5–15.5)
WBC: 7.5 10*3/uL (ref 4.0–10.5)
nRBC: 0 % (ref 0.0–0.2)

## 2018-09-03 LAB — BASIC METABOLIC PANEL
Anion gap: 8 (ref 5–15)
BUN: 19 mg/dL (ref 8–23)
CO2: 22 mmol/L (ref 22–32)
Calcium: 8.8 mg/dL — ABNORMAL LOW (ref 8.9–10.3)
Chloride: 111 mmol/L (ref 98–111)
Creatinine, Ser: 0.99 mg/dL (ref 0.44–1.00)
GFR calc Af Amer: >60 mL/min (ref >60)
GFR calc non Af Amer: 53 mL/min — ABNORMAL LOW (ref >60)
Glucose, Bld: 95 mg/dL (ref 70–99)
Potassium: 3.7 mmol/L (ref 3.5–5.1)
Sodium: 141 mmol/L (ref 135–145)

## 2018-09-03 LAB — GLUCOSE, CAPILLARY
Glucose-Capillary: 101 mg/dL — ABNORMAL HIGH (ref 70–99)
Glucose-Capillary: 114 mg/dL — ABNORMAL HIGH (ref 70–99)
Glucose-Capillary: 87 mg/dL (ref 70–99)
Glucose-Capillary: 118 mg/dL — ABNORMAL HIGH (ref 70–99)

## 2018-09-03 MED ORDER — METOPROLOL TARTRATE 50 MG PO TABS
50.0000 mg | ORAL_TABLET | Freq: Two times a day (BID) | ORAL | Status: DC
  Administered 2018-09-03 – 2018-09-04 (×4): 50 mg via ORAL
  Filled 2018-09-03 (×4): qty 1

## 2018-09-03 NOTE — Progress Notes (Signed)
STROKE TEAM PROGRESS NOTE   SUBJECTIVE (INTERVAL HISTORY) Pt lying in bed. As per RN, pt walked with PT in hallway but found to have HR goes to 140-160. Had EKG showed sinus tachy. Pt stated that she had similar episodes in the past with tachycardia on walking. She follows with Dr. Tamala [name] cardiology. Put her back to bed but sinus tachy continues. Will request cardiology consultation.    OBJECTIVE Vitals:   09/02/18 2319 09/03/18 0331 09/03/18 0700 09/03/18 1100  BP: (!) 151/59 138/61 135/89 (!) 148/97  Pulse: 79 75 (!) 143 (!) 138  Resp: 17 15 15 15   Temp: 98.2 F (36.8 C) 98.6 F (37 C) 98.2 F (36.8 C) 98.6 F (37 C)  TempSrc: Oral Oral Oral Axillary  SpO2: 96% 96% 95% 97%  Weight:      Height:        CBC:  Recent Labs  Lab 09/01/18 1915  09/02/18 0506 09/03/18 0551  WBC 7.5  --  7.5 7.5  NEUTROABS 4.2  --  5.6  --   HGB 11.0*   < > 9.1* 9.0*  HCT 36.0   < > 29.1* 29.7*  MCV 84.1  --  82.0 83.7  PLT 279  --  247 255   < > = values in this interval not displayed.    Basic Metabolic Panel:  Recent Labs  Lab 09/02/18 0506 09/03/18 0551  NA 139 141  K 3.8 3.7  CL 109 111  CO2 21* 22  GLUCOSE 106* 95  BUN 26* 19  CREATININE 0.93 0.99  CALCIUM 8.8* 8.8*    Lipid Panel:     Component Value Date/Time   CHOL 153 09/02/2018 0506   TRIG 278 (H) 09/02/2018 0506   TRIG 276 (H) 09/02/2018 0506   HDL 31 (L) 09/02/2018 0506   CHOLHDL 4.9 09/02/2018 0506   VLDL 56 (H) 09/02/2018 0506   LDLCALC 66 09/02/2018 0506   HgbA1c:  Lab Results  Component Value Date   HGBA1C 6.2 (H) 09/02/2018   Urine Drug Screen: No results found for: LABOPIA, COCAINSCRNUR, LABBENZ, AMPHETMU, THCU, LABBARB  Alcohol Level No results found for: [place]  MRI 09/02/2018 1. Multiple small foci early subacute ischemia within the left MCA territory without acute hemorrhage or mass effect. 2. Chronic microhemorrhages of the lower brainstem and left parietal lobe.  Ct Angio Head W Or  Wo Contrast  Ct Angio Neck W Or Wo Contrast 09/01/2018 1. 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  2. Bilateral carotid bifurcation atherosclerosis with less than 50% stenosis by NASCET criteria.  3.  Aortic atherosclerosis (ICD10-I70.0).   Dg Chest Port 1 View 09/01/2018 Endotracheal tube in satisfactory position. No acute infiltrate is noted.   Ct Head Code Stroke Wo Contrast 09/01/2018 1. No acute intracranial abnormality.  2. ASPECTS is 10.  Transthoracic Echocardiogram   1. The left ventricle has hyperdynamic systolic function, with an ejection fraction of >65%. The cavity size was normal. There is mildly increased left ventricular wall thickness. Left ventricular diastolic Doppler parameters are consistent with  pseudonormalization.  2. The right ventricle has normal systolic function. The cavity was normal. There is no increase in right ventricular wall thickness. Right ventricular systolic pressure could not be assessed.  3. Left atrial size was mild-moderately dilated.  4. The mitral valve is abnormal. There is moderate mitral annular calcification present.  5. No stenosis of the aortic valve.  6. Likely calcified, prominent Coumadin ridge (normal variant landmark between LA appendage and left upper pulmonary vein). There is a 0.8 x 1.4 cm calcified mass like lesion adherent to the lateral LA wall, in the location of the Coumadin ridge (left  lateral ridge). It is non mobile, and likely represents calcified normal variant structure.  7. No intracardiac thrombi or masses were visualized.  EKG - SR rate 74 BPM. (See cardiology reading for complete details)   PHYSICAL EXAM  Temp:  [97.7 F (36.5 C)-98.6 F (37 C)] 98.6 F (37 C) (07/13 1100) Pulse Rate:  [65-143] 138 (07/13 1100) Resp:  [15-26] 15 (07/13 1100) BP: (106-167)/(53-97) 148/97 (07/13 1100) SpO2:  [95 %-100 %] 97 % (07/13  1100)  General - Well nourished, well developed, in no apparent distress.  Ophthalmologic - fundi not visualized due to noncooperation.  Cardiovascular - Regular rate and rhythm.  Mental Status -  Awake alert but difficulty with orientation questions due to aphasia. Partial expressive aphasia, intermittent word salad and paraphasic errors and word finding difficulty. Following all simple commands, able to name 1/4. Able to repeat words but not sentences.   Cranial Nerves II - XII - II - Visual field intact OU. III, IV, VI - Extraocular movements intact. V - Facial sensation intact bilaterally. VII - mild right nasolabial fold flattening. VIII - Hearing & vestibular intact bilaterally. X - Palate elevates symmetrically. XI - Chin turning & shoulder shrug intact bilaterally. XII - Tongue protrusion intact.  Motor Strength - The patient's strength was 4/5 in all extremities and pronator drift was absent.  Bulk was normal and fasciculations were absent.   Motor Tone - Muscle tone was assessed at the neck and appendages and was normal.  Reflexes - The patient's reflexes were symmetrical in all extremities and she had no pathological reflexes.  Sensory - Light touch, temperature/pinprick were assessed and were symmetrical.    Coordination - The patient had normal movements in the hands with no ataxia or dysmetria.  Tremor was absent.  Gait and Station - deferred.   ASSESSMENT/PLAN [name] is a 83 y.o. female with history of hypertension, hyperlipidemia, obesity, remote GI bleeding presenting with sudden onset aphasia and right hemiparesis. tPA given Saturday 09/01/18 @ 1945  Stroke:  Left  MCA punctate small infarct due to left M1 occlusion s/p IR with TICI3 reperfusion - embolic - source unknown paradoxical emboli with bilateral DVT versus occult A. fib  CT head - No acute intracranial abnormality.   MRI head - small punctate left MCA infarcts  CTA H&N - occlusion of  the distal M1 with intermediate collateralization.  Bilateral ICA bulb atherosclerosis.  Right VA origin stenosis  2D Echo - EF > 65%  LE venous Doppler showed right PTV, left popliteal, and b/l peroneal vein acute DVT  TCD bubble study pending  Sars Corona Virus 2  - negative  LDL - 66  HgbA1c - 6.2  VTE prophylaxis - SCDs  Diet - NPO  No antithrombotic prior to admission, now on On apixaban 10 x 7 days followed 5 mg bid given DVT  Patient will be counseled to be compliant with her antithrombotic medications  Ongoing aggressive stroke risk factor management  Therapy recommendations:  HHPT  Disposition:  Pending  Bilateral DVT  LE venous Doppler showed right PTV, left popliteal, and b/l peroneal vein acute DVT  TCD bubble study pending  On apixaban 10 x 7 days followed 5 mg bid  May consider life  long anticoagulation given unprovoked bilateral DVT  SVT  HR 140-160s  EKG - Sinus tachycardia, Marked ST abnormality, possible lateral subendocardial injury  Has seen Dr. Tamala [name] in the past  Resume home metoprolol  Cardiology consulted   Hypertension  Stable . Permissive hypertension (OK if < 180/105) but gradually normalize in 3-5 days . Long-term BP goal normotensive  Other Stroke Risk Factors  Advanced age  Former cigarette smoker - quit  ETOH use, advised to drink no more than 1 alcoholic beverage per day.  Obesity, Body mass index is 39.16 kg/m., recommend weight loss, diet and exercise as appropriate   Family hx stroke (father)  Obstructive sleep apnea  Other Active Problems  Acute blood loss anemia - Hb -11.9->9.9->9.1->9.0  Remote history of GI bleeding 3 years ago  Hospital day # 2  I spent  35 minutes in total face-to-face time with the patient, more than 50% of which was spent in counseling and coordination of care, reviewing test results, images and medication, and discussing the diagnosis of stroke, SVT, LE DVT, on anticoagulation,  treatment plan and potential prognosis. This patient's care requiresreview of multiple databases, neurological assessment, discussion with family, other specialists and medical decision making of high complexity. I had long discussion with daughter over the phone, updated pt current condition, treatment plan and potential prognosis. She expressed understanding and appreciation.    Rosalin Hawking, MD PhD Stroke Neurology 09/03/2018 1:07 PM   To contact Stroke Continuity provider, please refer to [email or web address] After hours, contact General Neurology

## 2018-09-03 NOTE — Progress Notes (Signed)
Pt admitted as a transfer from 4N ICU; pt alert and verbally responsive; VSS: telemetry applied and verified with CCMD: NT called to second verify; pt skin clean, dry and intact with no pressure ulcer or opened wounds noted except for right groin cath site; pt oriented to the unit and room; fall/safety precaution and prevention education completed; pt in bed with call light within reach; bed alarm on. Will continue to closely monitor. PDarden Palmer Seleny Allbright RN   09/02/18 2240  Vitals  Temp 98 F (36.7 C)  Temp Source Oral  BP (!) 167/69  BP Location Right Arm  BP Method Automatic  Patient Position (if appropriate) Lying  Pulse Rate 88  Pulse Rate Source Dinamap  ECG Heart Rate 86  Cardiac Rhythm NSR  Resp 20  Oxygen Therapy  SpO2 97 %  O2 Device Room Air  Pain Assessment  Pain Scale 0-10  Pain Score 0  PCA/Epidural/Spinal Assessment  Respiratory Pattern Regular;Unlabored  Level of Consciousness  Level of Consciousness Alert  MEWS Score  MEWS RR 0  MEWS Pulse 0  MEWS Systolic 0  MEWS LOC 0  MEWS Temp 0  MEWS Score 0  MEWS Score Color Green

## 2018-09-03 NOTE — Evaluation (Signed)
Occupational Therapy Evaluation Patient Details Name: Monica [name] MRN: [id number] DOB: Jul 03, 1935 Today's Date: 09/03/2018    History of Present Illness Monica [name] is a 83 y.o. female with past medical history of hypertension, hyperlipidemia, obesity, remote GI bleeding presents to the emergency room as a code stroke for sudden onset aphasia and right hemiparesis. CT revealed L M1 occlusion.   Clinical Impression   Patient presenting with decreased I in self care, functional mobility/transfers, balance, strength, and safety awareness. Patient reports being mod I and living alone PTA. Patient currently functioning min guard - min A. Patient will benefit from acute OT to increase overall independence in the areas of ADLs, functional mobility, and safety awareness in order to safely discharge to next venue of care. Pt's HR increased to 147 bpm with mobility and standing tasks in room.     Follow Up Recommendations  Home health OT;Supervision/Assistance - 24 hour    Equipment Recommendations  None recommended by OT    Recommendations for Other Services (none known at this time)     Precautions / Restrictions Precautions Precautions: Fall Precaution Comments: expressive aphasia, watch HR Restrictions Weight Bearing Restrictions: No      Mobility Bed Mobility Overal bed mobility: Needs Assistance Bed Mobility: Supine to Sit     Supine to sit: Supervision     General bed mobility comments: HOB elevated, increased time, definite use of bed rail  Transfers Overall transfer level: Needs assistance Equipment used: Rolling walker (2 wheeled) Transfers: Sit to/from Stand Sit to Stand: Min guard    General transfer comment: min guard and min cuing for hand placement with RW    Balance Overall balance assessment: Needs assistance Sitting-balance support: Feet supported;No upper extremity supported Sitting balance-Leahy Scale: Good     Standing balance support: No  upper extremity supported;During functional activity Standing balance-Leahy Scale: Fair Standing balance comment: pt washed hands at sink but leaned on sink to steady self       ADL either performed or assessed with clinical judgement   ADL Overall ADL's : Needs assistance/impaired Eating/Feeding: Set up;Sitting Eating/Feeding Details (indicate cue type and reason): needing assistance opening containers Grooming: Wash/dry hands;Wash/dry face;Oral care;Standing;Min guard   Upper Body Bathing: Set up;Sitting   Lower Body Bathing: Minimal assistance;Sit to/from stand   Upper Body Dressing : Set up;Sitting   Lower Body Dressing: Minimal assistance;Sit to/from stand        General ADL Comments: min cuing for safety and proper technique     Vision Baseline Vision/History: Wears glasses Wears Glasses: At all times Patient Visual Report: No change from baseline              Pertinent Vitals/Pain Pain Assessment: No/denies pain     Hand Dominance Right   Extremity/Trunk Assessment Upper Extremity Assessment Upper Extremity Assessment: Generalized weakness LUE Deficits / Details: mild deficit at 4-/5 compared to R UE   Lower Extremity Assessment Lower Extremity Assessment: Overall WFL for tasks assessed   Cervical / Trunk Assessment Cervical / Trunk Assessment: Normal   Communication Communication Communication: Expressive difficulties   Cognition Arousal/Alertness: Awake/alert Behavior During Therapy: WFL for tasks assessed/performed Overall Cognitive Status: Impaired/Different from baseline Area of Impairment: Problem solving;Safety/judgement    Safety/Judgement: Decreased awareness of safety;Decreased awareness of deficits   Problem Solving: Slow processing;Decreased initiation;Difficulty sequencing;Requires verbal cues;Requires tactile cues                Home Living Family/patient expects to be discharged to:: Private residence Living Arrangements:  Alone Available Help at Discharge: Family;Available 24 hours/day Type of Home: House Home Access: Stairs to enter [employer] of Steps: 2 Entrance Stairs-Rails: Right;Left Home Layout: Able to live on main level with bedroom/bathroom     Bathroom Shower/Tub: Tub/shower unit   Bathroom Toilet: Handicapped height     Home Equipment: Environmental consultant - 2 wheels;Cane - single point;Grab bars - toilet;Grab bars - tub/shower          Prior Functioning/Environment Level of Independence: Independent        Comments: pt occasionally used cane or walker, pt was driving and doing own grocery shopping, pt reports she was having difficulty putting on her socks due to the weight she's gained        OT Problem List: Decreased strength;Decreased coordination;Decreased activity tolerance;Decreased safety awareness;Impaired balance (sitting and/or standing);Impaired UE functional use      OT Treatment/Interventions: Self-care/ADL training;Therapeutic exercise;Therapeutic activities;Energy conservation;Neuromuscular education;DME and/or AE instruction;Patient/family education;Balance training    OT Goals(Current goals can be found in the care plan section) Acute Rehab OT Goals Patient Stated Goal: to go home OT Goal Formulation: With patient Time For Goal Achievement: 09/17/18 Potential to Achieve Goals: Good ADL Goals Pt Will Perform Eating: with modified independence Pt Will Perform Grooming: with modified independence Pt Will Perform Upper Body Bathing: with modified independence Pt Will Perform Lower Body Bathing: with supervision Pt Will Perform Upper Body Dressing: with modified independence Pt Will Perform Lower Body Dressing: with supervision Pt Will Transfer to Toilet: with supervision Pt Will Perform Toileting - Clothing Manipulation and hygiene: with supervision Pt Will Perform Tub/Shower Transfer: with supervision  OT Frequency: Min 2X/week   Barriers to D/C: (none known  at this time)             AM-PAC OT "6 Clicks" Daily Activity     Outcome Measure Help from another person eating meals?: A Little Help from another person taking care of personal grooming?: A Little Help from another person toileting, which includes using toliet, bedpan, or urinal?: A Little Help from another person bathing (including washing, rinsing, drying)?: A Little Help from another person to put on and taking off regular upper body clothing?: A Little Help from another person to put on and taking off regular lower body clothing?: A Little 6 Click Score: 18   End of Session Equipment Utilized During Treatment: Rolling walker Nurse Communication: Mobility status  Activity Tolerance: Patient tolerated treatment well Patient left: in chair;with call bell/phone within reach;with chair alarm set  OT Visit Diagnosis: Muscle weakness (generalized) (M62.81);Hemiplegia and hemiparesis Hemiplegia - Right/Left: Right Hemiplegia - dominant/non-dominant: Dominant Hemiplegia - caused by: Unspecified                Time: 0086-7619 OT Time Calculation (min): 20 min Charges:  OT General Charges $OT Visit: 1 Visit OT Evaluation $OT Eval Low Complexity: 1 Low  Devonia Farro P, MS, OTR/L 09/03/2018, 2:55 PM

## 2018-09-03 NOTE — Progress Notes (Signed)
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 Estanislado Pandy. Patient awake and alert sitting in bed with no complaints at this time. Can spontaneously move all extremities. Right groin incision c/d/i.  MR brain 09/02/2018: 1. Multiple small foci early subacute ischemia within the left MCA territory without acute hemorrhage or mass effect. 2. Chronic microhemorrhages of the lower brainstem and left parietal lobe.   Allergies: Buspirone hcl, Lisinopril, and Prozac [fluoxetine hcl]  Medications: Prior to Admission medications   Medication Sig Start Date End Date Taking? Authorizing Provider  allopurinol (ZYLOPRIM) 100 MG tablet TAKE 2 TABLETS BY MOUTH ONCE A DAY *NEED OFFICE VISIT Patient taking differently: Take 200 mg by mouth daily.  01/03/17  Yes Hulan Saas M, DO  amLODipine (NORVASC) 5 MG tablet TAKE 1 TABLET (5 MG TOTAL) BY MOUTH DAILY. PLEASE KEEP UPCOMING APPOINTMENT FOR FURTHER REFILLS Patient taking differently: Take 5 mg by mouth daily.  11/21/17  Yes Belva Crome, MD  colchicine 0.6 MG tablet Take 0.6 mg by mouth daily as needed (gout attacks).   Yes [provider]  docusate sodium (COLACE) 100 MG capsule Take 100 mg by mouth 2 (two) times daily as needed for mild constipation.   Yes [provider]  DULoxetine (CYMBALTA) 30 MG capsule Take 30 mg by mouth daily.   Yes [provider]  furosemide (LASIX) 40 MG tablet TAKE 1 TABLET BY MOUTH DAILY Patient taking differently: Take 40 mg by mouth daily.  10/26/16  Yes Cook, Jayce G, DO  Magnesium 250 MG TABS Take 250 mg by mouth daily.   Yes [provider]  meloxicam (MOBIC) 15 MG tablet Take 15 mg by mouth daily. 08/22/18  Yes [provider]   metoprolol succinate (TOPROL-XL) 100 MG 24 hr tablet TAKE 1 TABLET BY MOUTH DAILY WITH OR IMMEDIATLY FOLLOWING A MEAL Patient taking differently: Take 100 mg by mouth daily.  06/06/16  Yes Cook, Jayce G, DO  pantoprazole (PROTONIX) 40 MG tablet Take 40 mg by mouth daily before breakfast.  07/29/18  Yes [provider]  potassium chloride SA (K-DUR,KLOR-CON) 20 MEQ tablet Take 1 tablet (20 mEq total) by mouth daily. 02/26/16  Yes Cook, Jayce G, DO  Vitamin D, Ergocalciferol, (DRISDOL) 1.25 MG (50000 UT) CAPS capsule TAKE 1 CAPSULE (50,000 UNITS TOTAL) BY MOUTH EVERY 7 (SEVEN) DAYS. Patient not taking: Reported on 09/02/2018 01/22/18   Lyndal Pulley, DO     Vital Signs: BP 135/89 (BP Location: Right Arm)   Pulse (!) 143   Temp 98.2 F (36.8 C) (Oral)   Resp 15   Ht 5\' 7"  (1.702 m)   Wt 250 lb (113.4 kg)   SpO2 95%   BMI 39.16 kg/m   Physical Exam Vitals signs and nursing note reviewed.  Constitutional:      General: She is not in acute distress.    Appearance: Normal appearance.  Pulmonary:     Effort: Pulmonary effort is normal. No respiratory distress.  Skin:    General: Skin is warm and dry.     Comments: Right groin incision soft without active bleeding or hematoma.  Neurological:     Mental Status: She is alert.     Comments: Alert, awake, and oriented x3. Speech and comprehension intact. PERRL bilaterally. No facial asymmetry.  Tongue midline. Can spontaneously move all extremities. No pronator drift. Fine motor and coordination intact and symmetric.  Psychiatric:        Mood and Affect: Mood normal.        Behavior: Behavior normal.        Thought Content: Thought content normal.        Judgment: Judgment normal.     Imaging: Ct Angio Head W Or Wo Contrast  Result Date: 09/01/2018 CLINICAL DATA:  Code stroke focal neurologic deficit EXAM: CT ANGIOGRAPHY HEAD AND NECK TECHNIQUE: Multidetector CT imaging of the head and neck was performed using the standard  protocol during bolus administration of intravenous contrast. Multiplanar CT image reconstructions and MIPs were obtained to evaluate the vascular anatomy. Carotid stenosis measurements (when applicable) are obtained utilizing NASCET criteria, using the distal internal carotid diameter as the denominator. CONTRAST:  31mL OMNIPAQUE IOHEXOL 350 MG/ML SOLN COMPARISON:  Head CT 09/01/2018 FINDINGS: CTA NECK FINDINGS SKELETON: There is no bony spinal canal stenosis. No lytic or blastic lesion. OTHER NECK: Normal pharynx, larynx and major salivary glands. No cervical lymphadenopathy. Unremarkable thyroid gland. UPPER CHEST: No pneumothorax or pleural effusion. No nodules or masses. AORTIC ARCH: There is mild calcific atherosclerosis of the aortic arch. There is no aneurysm, dissection or hemodynamically significant stenosis of the visualized ascending aorta and aortic arch. Conventional 3 vessel aortic branching pattern. The visualized proximal subclavian arteries are widely patent. RIGHT CAROTID SYSTEM: --Common carotid artery: Widely patent origin without common carotid artery dissection or aneurysm. --Internal carotid artery: No dissection, occlusion or aneurysm. There is mixed density atherosclerosis extending into the proximal ICA, resulting in less than 50% stenosis. --External carotid artery: No acute abnormality. LEFT CAROTID SYSTEM: --Common carotid artery: Widely patent origin without common carotid artery dissection or aneurysm. --Internal carotid artery: No dissection, occlusion or aneurysm. There is mixed density atherosclerosis extending into the proximal ICA, resulting in less than 50% stenosis. --External carotid artery: No acute abnormality. VERTEBRAL ARTERIES: Right dominant configuration. Both origins are clearly patent. No dissection, occlusion or flow-limiting stenosis to the skull base (V1-V3 segments). CTA HEAD FINDINGS POSTERIOR CIRCULATION: --Vertebral arteries: Normal V4 segments. --Posterior  inferior cerebellar arteries (PICA): Patent origins from the vertebral arteries. --Anterior inferior cerebellar arteries (AICA): Patent origins from the basilar artery. --Basilar artery: Normal. --Superior cerebellar arteries: Normal. --Posterior cerebral arteries (PCA): Normal. Both originate from the basilar artery. Posterior communicating arteries (p-comm) are diminutive or absent. ANTERIOR CIRCULATION: --Intracranial internal carotid arteries: Normal. --Anterior cerebral arteries (ACA): Normal. Both A1 segments are present. Patent anterior communicating artery (a-comm). --Middle cerebral arteries (MCA): There is occlusion of the distal M1 segment of the left middle cerebral artery. There is intermediate collateralization. The right MCA is normal. VENOUS SINUSES: As permitted by contrast timing, patent. ANATOMIC VARIANTS: None Review of the MIP images confirms the above findings. IMPRESSION: 1. 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 2. Bilateral carotid bifurcation atherosclerosis with less than 50% stenosis by NASCET criteria. 3.  Aortic atherosclerosis (ICD10-I70.0). Critical Value/emergent results were called by telephone at the time of interpretation on 09/01/2018 at 7:48 pm to Dr. Samara Snide , who verbally acknowledged these results. Electronically Signed   By: Ulyses Jarred M.D.   On: 09/01/2018 19:48   Ct Angio Neck W Or Wo Contrast  Result Date: 09/01/2018 CLINICAL DATA:  Code stroke focal neurologic deficit EXAM: CT ANGIOGRAPHY HEAD AND NECK TECHNIQUE: Multidetector CT imaging of the head  and neck was performed using the standard protocol during bolus administration of intravenous contrast. Multiplanar CT image reconstructions and MIPs were obtained to evaluate the vascular anatomy. Carotid stenosis measurements (when applicable) are obtained utilizing NASCET criteria, using the distal internal carotid  diameter as the denominator. CONTRAST:  55mL OMNIPAQUE IOHEXOL 350 MG/ML SOLN COMPARISON:  Head CT 09/01/2018 FINDINGS: CTA NECK FINDINGS SKELETON: There is no bony spinal canal stenosis. No lytic or blastic lesion. OTHER NECK: Normal pharynx, larynx and major salivary glands. No cervical lymphadenopathy. Unremarkable thyroid gland. UPPER CHEST: No pneumothorax or pleural effusion. No nodules or masses. AORTIC ARCH: There is mild calcific atherosclerosis of the aortic arch. There is no aneurysm, dissection or hemodynamically significant stenosis of the visualized ascending aorta and aortic arch. Conventional 3 vessel aortic branching pattern. The visualized proximal subclavian arteries are widely patent. RIGHT CAROTID SYSTEM: --Common carotid artery: Widely patent origin without common carotid artery dissection or aneurysm. --Internal carotid artery: No dissection, occlusion or aneurysm. There is mixed density atherosclerosis extending into the proximal ICA, resulting in less than 50% stenosis. --External carotid artery: No acute abnormality. LEFT CAROTID SYSTEM: --Common carotid artery: Widely patent origin without common carotid artery dissection or aneurysm. --Internal carotid artery: No dissection, occlusion or aneurysm. There is mixed density atherosclerosis extending into the proximal ICA, resulting in less than 50% stenosis. --External carotid artery: No acute abnormality. VERTEBRAL ARTERIES: Right dominant configuration. Both origins are clearly patent. No dissection, occlusion or flow-limiting stenosis to the skull base (V1-V3 segments). CTA HEAD FINDINGS POSTERIOR CIRCULATION: --Vertebral arteries: Normal V4 segments. --Posterior inferior cerebellar arteries (PICA): Patent origins from the vertebral arteries. --Anterior inferior cerebellar arteries (AICA): Patent origins from the basilar artery. --Basilar artery: Normal. --Superior cerebellar arteries: Normal. --Posterior cerebral arteries (PCA): Normal.  Both originate from the basilar artery. Posterior communicating arteries (p-comm) are diminutive or absent. ANTERIOR CIRCULATION: --Intracranial internal carotid arteries: Normal. --Anterior cerebral arteries (ACA): Normal. Both A1 segments are present. Patent anterior communicating artery (a-comm). --Middle cerebral arteries (MCA): There is occlusion of the distal M1 segment of the left middle cerebral artery. There is intermediate collateralization. The right MCA is normal. VENOUS SINUSES: As permitted by contrast timing, patent. ANATOMIC VARIANTS: None Review of the MIP images confirms the above findings. IMPRESSION: 1. 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 2. Bilateral carotid bifurcation atherosclerosis with less than 50% stenosis by NASCET criteria. 3.  Aortic atherosclerosis (ICD10-I70.0). Critical Value/emergent results were called by telephone at the time of interpretation on 09/01/2018 at 7:48 pm to Dr. Samara Snide , who verbally acknowledged these results. Electronically Signed   By: Ulyses Jarred M.D.   On: 09/01/2018 19:48   Mr Brain Wo Contrast  Result Date: 09/02/2018 CLINICAL DATA:  Right-sided facial weakness and arm weakness EXAM: MRI HEAD WITHOUT CONTRAST TECHNIQUE: Multiplanar, multiecho pulse sequences of the brain and surrounding structures were obtained without intravenous contrast. COMPARISON:  CTA head neck 09/01/2018 FINDINGS: BRAIN: There is no acute infarct, acute hemorrhage or extra-axial collection. A partially empty sella is incidentally noted. There are multiple small areas of abnormal diffusion restriction within the left MCA territory, including the left corpus striatum and multiple subcortical locations of the left frontal and parietal lobes. No associated mass effect or midline shift. No acute hemorrhage. The cerebral and cerebellar volume are age-appropriate. There is no  hydrocephalus. Foci of chronic microhemorrhage within the posterior left parietal lobe and lower right brainstem. VASCULAR: The major  intracranial arterial and venous sinus flow voids are normal. SKULL AND UPPER CERVICAL SPINE: Calvarial bone marrow signal is normal. There is no skull base mass. The visualized upper cervical spine and soft tissues are normal. SINUSES/ORBITS: There are no fluid levels or advanced mucosal thickening. The mastoid air cells and middle ear cavities are free of fluid. The orbits are normal. IMPRESSION: 1. Multiple small foci early subacute ischemia within the left MCA territory without acute hemorrhage or mass effect. 2. Chronic microhemorrhages of the lower brainstem and left parietal lobe. Electronically Signed   By: Ulyses Jarred M.D.   On: 09/02/2018 17:48   Dg Chest Port 1 View  Result Date: 09/01/2018 CLINICAL DATA:  Status post intubation EXAM: PORTABLE CHEST 1 VIEW COMPARISON:  12/12/2016 FINDINGS: Cardiac shadow is stable. Endotracheal tube is noted in satisfactory position. Lungs are clear bilaterally. No bony abnormality is seen. IMPRESSION: Endotracheal tube in satisfactory position. No acute infiltrate is noted. Electronically Signed   By: Inez Catalina M.D.   On: 09/01/2018 23:48   Ct Head Code Stroke Wo Contrast  Result Date: 09/01/2018 CLINICAL DATA:  Code stroke. Focal neurologic deficit for less than 6 hours EXAM: CT HEAD WITHOUT CONTRAST TECHNIQUE: Contiguous axial images were obtained from the base of the skull through the vertex without intravenous contrast. COMPARISON:  None. FINDINGS: Brain: There is no mass, hemorrhage or extra-axial collection. The size and configuration of the ventricles and extra-axial CSF spaces are normal. There is mild white matter hypoattenuation. Partially empty sella is incidentally noted Vascular: No abnormal hyperdensity of the major intracranial arteries or dural venous sinuses. No intracranial atherosclerosis. Skull: The  visualized skull base, calvarium and extracranial soft tissues are normal. Sinuses/Orbits: No fluid levels or advanced mucosal thickening of the visualized paranasal sinuses. No mastoid or middle ear effusion. The orbits are normal. ASPECTS [facility] Stroke Program Early CT Score) - Ganglionic level infarction (caudate, lentiform nuclei, internal capsule, insula, M1-M3 cortex): 7 - Supraganglionic infarction (M4-M6 cortex): 3 Total score (0-10 with 10 being normal): 10 IMPRESSION: 1. No acute intracranial abnormality. 2. ASPECTS is 10. These results were communicated to Dr. Karena Addison Aroor at 7:32 pm on 09/01/2018 by text page via the [facility] messaging system. Electronically Signed   By: Ulyses Jarred M.D.   On: 09/01/2018 19:33   Vas [place] Lower Extremity Venous (dvt)  Result Date: 09/02/2018  Lower Venous Study Indications: Stroke.  Comparison Study: No prior study on file for comparison. Performing Technologist: Sharion Dove RVS  Examination Guidelines: A complete evaluation includes B-mode imaging, spectral Doppler, color Doppler, and power Doppler as needed of all accessible portions of each vessel. Bilateral testing is considered an integral part of a complete examination. Limited examinations for reoccurring indications may be performed as noted.  +---------+---------------+---------+-----------+----------+-------+ RIGHT    CompressibilityPhasicitySpontaneityPropertiesSummary +---------+---------------+---------+-----------+----------+-------+ CFV      Full           Yes      Yes                          +---------+---------------+---------+-----------+----------+-------+ SFJ      Full                                                 +---------+---------------+---------+-----------+----------+-------+ FV Prox  Full                                                 +---------+---------------+---------+-----------+----------+-------+  FV Mid   Full                                                  +---------+---------------+---------+-----------+----------+-------+ FV DistalFull                                                 +---------+---------------+---------+-----------+----------+-------+ PFV      Full                                                 +---------+---------------+---------+-----------+----------+-------+ POP      Full           Yes      Yes                          +---------+---------------+---------+-----------+----------+-------+ PTV      None                                         Acute   +---------+---------------+---------+-----------+----------+-------+ PERO     None                                         Acute   +---------+---------------+---------+-----------+----------+-------+   +---------+---------------+---------+-----------+----------+-------+ LEFT     CompressibilityPhasicitySpontaneityPropertiesSummary +---------+---------------+---------+-----------+----------+-------+ CFV      Full           Yes      Yes                          +---------+---------------+---------+-----------+----------+-------+ SFJ      Full                                                 +---------+---------------+---------+-----------+----------+-------+ FV Prox  Full                                                 +---------+---------------+---------+-----------+----------+-------+ FV Mid   Full                                                 +---------+---------------+---------+-----------+----------+-------+ FV DistalFull                                                 +---------+---------------+---------+-----------+----------+-------+ PFV      Full                                                 +---------+---------------+---------+-----------+----------+-------+  POP      None           No       No                   Acute   +---------+---------------+---------+-----------+----------+-------+  PTV      Full                                                 +---------+---------------+---------+-----------+----------+-------+ PERO     None                                         Acute   +---------+---------------+---------+-----------+----------+-------+     Summary: Right: Findings consistent with acute deep vein thrombosis involving the right posterior tibial veins, and right peroneal veins. Left: Findings consistent with acute deep vein thrombosis involving the left popliteal vein, and left peroneal veins.  *See table(s) above for measurements and observations.    Preliminary     Labs:  CBC: Recent Labs    09/01/18 1915  09/01/18 2308 09/02/18 0421 09/02/18 0506 09/03/18 0551  WBC 7.5  --   --   --  7.5 7.5  HGB 11.0*   < > 8.8* 9.9* 9.1* 9.0*  HCT 36.0   < > 26.0* 29.0* 29.1* 29.7*  PLT 279  --   --   --  247 255   < > = values in this interval not displayed.    COAGS: Recent Labs    09/01/18 1915  INR 1.0  APTT 30    BMP: Recent Labs    09/01/18 1915 09/01/18 1920 09/01/18 2308 09/02/18 0421 09/02/18 0506 09/03/18 0551  NA 141 141 143 141 139 141  K 4.2 4.1 3.2* 3.6 3.8 3.7  CL 105 106  --   --  109 111  CO2 26  --   --   --  21* 22  GLUCOSE 106* 102*  --   --  106* 95  BUN 27* 29*  --   --  26* 19  CALCIUM 9.3  --   --   --  8.8* 8.8*  CREATININE 1.17* 1.10*  --   --  0.93 0.99  GFRNONAA 44*  --   --   --  58* 53*  GFRAA 51*  --   --   --  >60 >60    LIVER FUNCTION TESTS: Recent Labs    09/01/18 1915  BILITOT 0.5  AST 22  ALT 16  ALKPHOS 99  PROT 6.8  ALBUMIN 3.6    Assessment and Plan:  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 Estanislado Pandy. Patient's condition improving- speech clear, can spontaneously move all extremities. Right groin incision c/d/i. Further plans per neurology/CCM- appreciate and agree with management. Please call NIR with  questions/concerns.   Electronically Signed: Earley Abide, PA-C 09/03/2018, 9:29 AM   I spent a total of 25 Minutes at the the patient's bedside AND on the patient's hospital floor or unit, greater than 50% of which was counseling/coordinating care for left MCA M1 segment occlusion s/p revascularization.

## 2018-09-03 NOTE — Evaluation (Signed)
Physical Therapy Evaluation Patient Details Name: Monica [name] MRN: [id number] DOB: Jul 27, 1935 Today's Date: 09/03/2018   History of Present Illness  Monica [name] is a 83 y.o. female with past medical history of hypertension, hyperlipidemia, obesity, remote GI bleeding presents to the emergency room as a code stroke for sudden onset aphasia and right hemiparesis. CT revealed L M1 occlusion.  Clinical Impression  Pt admitted with above. Pt indep PTA now demonstrating tachycardia into 160s with amb with RW and significant SOB with ambulation. Pt reports "I always move so fast and get SOB." RN and MD made aware of tachycardia and SOB. Pt unsafe to return home alone at this time and would require 24/7 assist and HHPT to aide in achieving safe mod I function.    Follow Up Recommendations Home health PT;Supervision/Assistance - 24 hour    Equipment Recommendations  None recommended by PT    Recommendations for Other Services       Precautions / Restrictions Precautions Precautions: Fall Precaution Comments: expressive aphasia Restrictions Weight Bearing Restrictions: No      Mobility  Bed Mobility Overal bed mobility: Needs Assistance Bed Mobility: Supine to Sit     Supine to sit: Supervision     General bed mobility comments: HOB elevated, increased time, definite use of bed rail  Transfers Overall transfer level: Needs assistance Equipment used: None Transfers: Sit to/from Stand Sit to Stand: Min guard;Min assist         General transfer comment: minA to power up, min guard to steady upon standing due to first time up in 24 hours, pt with wide base of support  Ambulation/Gait Ambulation/Gait assistance: Min assist;Min guard Gait Distance (Feet): 10 Feet(x1, 150'x1 with RW) Assistive device: Rolling walker (2 wheeled);None Gait Pattern/deviations: Step-through pattern;Decreased stride length;Wide base of support Gait velocity: wfl Gait velocity interpretation:  <1.31 ft/sec, indicative of household ambulator General Gait Details: pt amb to bathroom without AD, pt with wide base of support and holding onto counter/reaching for something to hold onto, pt with noted lateral sway. Pt then ambulated 150' with RW, pt with noted SOB s/p 50', pt reports this to be normal for her, pt more steady with RW. Pts HR increased to 162bpm and SpO2 at 90% on RA  Stairs            Wheelchair Mobility    Modified Rankin (Stroke Patients Only)       Balance Overall balance assessment: Needs assistance Sitting-balance support: Feet supported;No upper extremity supported Sitting balance-Leahy Scale: Good Sitting balance - Comments: pt however unable to don socks due to body habitus   Standing balance support: No upper extremity supported;During functional activity Standing balance-Leahy Scale: Fair Standing balance comment: pt washed hands at sink but leaned on sink to steady self                             Pertinent Vitals/Pain Pain Assessment: No/denies pain    Home Living Family/patient expects to be discharged to:: Private residence Living Arrangements: Alone Available Help at Discharge: Family;Available 24 hours/day(Dtr can stay with her) Type of Home: House Home Access: Stairs to enter Entrance Stairs-Rails: Psychiatric nurse of Steps: 2 Home Layout: Able to live on main level with bedroom/bathroom Home Equipment: Walker - 2 wheels;Cane - single point;Grab bars - toilet;Grab bars - tub/shower      Prior Function Level of Independence: Independent         Comments: pt  occasionally used can or walker, pt was driving and doing own grocery shopping, pt reports she was having difficulty putting on her socks due to the weight she's gained     Hand Dominance   Dominant Hand: Right    Extremity/Trunk Assessment   Upper Extremity Assessment Upper Extremity Assessment: LUE deficits/detail LUE Deficits / Details:  mild deficit at 4-/5 compared to R UE    Lower Extremity Assessment Lower Extremity Assessment: Overall WFL for tasks assessed    Cervical / Trunk Assessment Cervical / Trunk Assessment: Normal  Communication   Communication: Expressive difficulties(slurred speech)  Cognition Arousal/Alertness: Awake/alert Behavior During Therapy: WFL for tasks assessed/performed Overall Cognitive Status: Impaired/Different from baseline Area of Impairment: Problem solving;Safety/judgement                         Safety/Judgement: Decreased awareness of safety;Decreased awareness of deficits   Problem Solving: Slow processing;Decreased initiation;Difficulty sequencing;Requires verbal cues;Requires tactile cues(pt with difficulty managing soap and paper towel dispensers) General Comments: pt with delayed response time however pt with HOH and PT with mask and face shield on.       General Comments General comments (skin integrity, edema, etc.): RN made aware of inc heartrate and SOB    Exercises     Assessment/Plan    PT Assessment Patient needs continued PT services  PT Problem List Decreased strength;Decreased activity tolerance;Decreased balance;Decreased mobility;Decreased coordination;Decreased cognition;Decreased knowledge of use of DME;Decreased safety awareness       PT Treatment Interventions DME instruction;Gait training;Stair training;Functional mobility training;Therapeutic activities;Therapeutic exercise;Balance training;Neuromuscular re-education    PT Goals (Current goals can be found in the Care Plan section)  Acute Rehab PT Goals Patient Stated Goal: home PT Goal Formulation: With patient Time For Goal Achievement: 09/17/18 Potential to Achieve Goals: Good    Frequency Min 4X/week   Barriers to discharge        Co-evaluation               AM-PAC PT "6 Clicks" Mobility  Outcome Measure Help needed turning from your back to your side while in a flat  bed without using bedrails?: A Little Help needed moving from lying on your back to sitting on the side of a flat bed without using bedrails?: A Little Help needed moving to and from a bed to a chair (including a wheelchair)?: A Little Help needed standing up from a chair using your arms (e.g., wheelchair or bedside chair)?: A Little Help needed to walk in hospital room?: A Little Help needed climbing 3-5 steps with a railing? : A Lot 6 Click Score: 17    End of Session Equipment Utilized During Treatment: Gait belt Activity Tolerance: Patient tolerated treatment well Patient left: in chair;with call bell/phone within reach;with nursing/sitter in room Nurse Communication: Mobility status(tachy and SOB) PT Visit Diagnosis: Unsteadiness on feet (R26.81);Muscle weakness (generalized) (M62.81);Difficulty in walking, not elsewhere classified (R26.2)    Time: 0762-2633 PT Time Calculation (min) (ACUTE ONLY): 31 min   Charges:   PT Evaluation $PT Eval Moderate Complexity: 1 Mod PT Treatments $Gait Training: 8-22 mins        Kittie Plater, PT, DPT Acute Rehabilitation Services Pager #: [phone] Office #: [phone]   Monica [name] 09/03/2018, 8:38 AM

## 2018-09-03 NOTE — Progress Notes (Signed)
  Speech Language Pathology Treatment: Cognitive-Linquistic(Aphasia )  Patient Details Name: Monica [name] MRN: [id number] DOB: 17-Aug-1935 Today's Date: 09/03/2018 Time: 4193-7902 SLP Time Calculation (min) (ACUTE ONLY): 21 min  Assessment / Plan / Recommendation Clinical Impression  Pt was seen for aphasia treatment and was cooperative throughout the session without complaint of pain. She demonstrated 60% accuracy with responsive naming of lower-frequency words increasing to 100% with min-mod cues. She was able to provide 7-13 items per concrete category during divergent naming with an average of  9 items per category when min cues were provided. She required repetition when complex problems were presented but with repetition she was able to comprehend the questions and respond appropriately. She achieved 50% accuracy with auditory comprehension of voice mails increasing to 87% accuracy with repetition and cues. SLP will continue to follow pt.    HPI HPI: Pt is an 83 y.o. female with past medical history of hypertension, hyperlipidemia, obesity, remote GI bleeding who presented to the emergency room as a code stroke for sudden onset aphasia and right hemiparesis. IV tPA was administered and IR for mechanical thrombectomy completed. MRI of the brain showed multiple small foci early subacute ischemia within the left MCA territory without acute hemorrhage or mass effect.      SLP Plan  Continue with current plan of care  Patient needs continued Speech Lanaguage Pathology Services    Recommendations                   Follow up Recommendations: Home health SLP;24 hour supervision/assistance SLP Visit Diagnosis: Aphasia (R47.01) Plan: Continue with current plan of care       Lorriann Hansmann I. Hardin Negus, [place], [place] Office number [phone] Pager [place] 09/03/2018, 4:41 PM

## 2018-09-03 NOTE — Discharge Instructions (Signed)
Information on my medicine - ELIQUIS (apixaban)  This medication education was reviewed with me or my healthcare representative as part of my discharge preparation.  The pharmacist that spoke with me during my hospital stay was:  Brain Hilts, [facility]  Why was Eliquis prescribed for you? Eliquis was prescribed to treat blood clots that may have been found in the veins of your legs (deep vein thrombosis) or in your lungs (pulmonary embolism) and to reduce the risk of them occurring again.  What do You need to know about Eliquis ? The starting dose is 10 mg (two 5 mg tablets) taken TWICE daily for the FIRST SEVEN (7) DAYS, then on Monday 09/10/18 the dose is reduced to ONE 5 mg tablet taken TWICE daily.  Eliquis may be taken with or without food.   Try to take the dose about the same time in the morning and in the evening. If you have difficulty swallowing the tablet whole please discuss with your pharmacist how to take the medication safely.  Take Eliquis exactly as prescribed and DO NOT stop taking Eliquis without talking to the doctor who prescribed the medication.  Stopping may increase your risk of developing a new blood clot.  Refill your prescription before you run out.  After discharge, you should have regular check-up appointments with your healthcare provider that is prescribing your Eliquis.    What do you do if you miss a dose? If a dose of ELIQUIS is not taken at the scheduled time, take it as soon as possible on the same day and twice-daily administration should be resumed. The dose should not be doubled to make up for a missed dose.  Important Safety Information A possible side effect of Eliquis is bleeding. You should call your healthcare provider right away if you experience any of the following: ? Bleeding from an injury or your nose that does not stop. ? Unusual colored urine (red or dark brown) or unusual colored stools (red or black). ? Unusual bruising for  unknown reasons. ? A serious fall or if you hit your head (even if there is no bleeding).  Some medicines may interact with Eliquis and might increase your risk of bleeding or clotting while on Eliquis. To help avoid this, consult your healthcare provider or pharmacist prior to using any new prescription or non-prescription medications, including herbals, vitamins, non-steroidal anti-inflammatory drugs (NSAIDs) and supplements.  This website has more information on Eliquis (apixaban): http://[email or web address].[email or web address]/home

## 2018-09-03 NOTE — Consult Note (Addendum)
The patient has been seen in conjunction with Monica [name]. All aspects of care have been considered and discussed. The patient has been personally interviewed, examined, and all clinical data has been reviewed.   Currently and atrial flutter with 2-1 AV conduction and ventricular rate of approximately 140 bpm.  This says never before been identified although a retrospective history now states that she has occasional racing heart and dizziness at home 2-3 times per year.  Whether atrial arrhythmia is primary or is a rebound phenomenon secondary to acute withdrawal of high-dose beta-blocker therapy is uncertain.  Given her clinical risk factor profile, assumption that she is having atrial arrhythmias that led to stroke seem most prudent.  Recommend resume beta-blocker therapy.  We should give her 12 to 24 hours to see if the rhythm converts spontaneously.  If not or if she develops symptoms additional therapy could be considered including IV diltiazem.  We have not added additional therapy due to recent stroke and desire to achieve permissive hypertension until healing occurs in the infarcted territory.  Agree with anticoagulation therapy and will need indefinite apixaban for stroke prophylaxis.  Cardiology Consultation:   Patient ID: Monica [name]; [id number]; 1935-06-26   Admit date: 09/01/2018 Date of Consult: 09/03/2018  Primary Care Provider: Seward Carol, [name] Primary Cardiologist: Monica Grooms, [name] Primary Electrophysiologist:  None  Patient Profile:   Monica [name] is a 83 y.o. female with a PMH of  HTN, LVH, chronic diastolic CHF, HLD, OSA non-compliant with CPAP, and obesity, who was admitted with an acute stroke and is being seen today for the evaluation of SVT at the request of Monica [name].  History of Present Illness:   Monica [name] was in her usual state of health until 09/01/2018 when she began having sudden onset aphasia and right hemiparesis. She was found to have an  acute stroke on imaging and underwent tPA and thrombectomy for management of L MCA occlusion. She was intubated post-procedure due to nasal/oral bleeding in the setting of tPA. Successfully extubated 09/02/2018. 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  Echo showed EF >65%, mild LVH, G2DD, mild-moderately dilated LA, moderate MAC, and calcified normal variant structure. Her hospitalization has been complicated by tachycardia, for which cardiology was consulted.  She was last evaluated by cardiology at an outpatient visit with Dr. STamala Julian9/2019, at which time she reported chronic DOE, as well as chronic LBP and sciatic pain. She was recommended for weight loss and CPAP compliance. No medication changes occurred at that time and she was recommended to return in 1 year. Her last ischemic evaluation was a NST in 2000 which revealed possible anterior wall ischemia vs breast attenuation.   At the time of this evaluation she is feeling almost back to normal. Still with some word finding difficulty. She reports having intermittent dizzy spells associated with racing heart beats which occurs 1-2 times per week and last for mints-hours, generally resolving with rest. No complaints of chest pain. She has chronic DOE and LE edema (particularly since knee replacement surgery). She denies orthopnea or PND. She has known OSA but has not used a CPAP in years due to inconvenience of hook up and nocturnal polyuria. She reports difficulty with medication compliance as well.    Past Medical History:  Diagnosis Date   Allergic rhinitis    Anxiety    Benign neoplasm of breast 2013  Cardiomegaly    Compulsive overeating    Depression    Diffuse cystic mastopathy 2013   Edema    GI bleeding    Gout    HTN (hypertension)    Hyperlipidemia    Lump or mass in breast 2012   OA  (osteoarthritis) of knee    with injections   Obesity    Other abnormal glucose    Personal history of tobacco use, presenting hazards to health    Sciatica    Sinus infection 2010   Unspecified sleep apnea     Past Surgical History:  Procedure Laterality Date   adenosine cardiolite  1/08   low risk    APPENDECTOMY     BREAST LUMPECTOMY     right x1 ('89) left x2 ('70s, '90)   CARDIAC CATHETERIZATION  2001   normal per pt   COLONOSCOPY  11/02   diverticulosis   COLONOSCOPY  9/06   diverticulosis, hemorhoids   COLONOSCOPY N/A 10/01/2016   Procedure: COLONOSCOPY;  Surgeon: Monica [name];  Location: [facility];  Service: Endoscopy;  Laterality: N/A;   dexa  3/02   normal   EYE SURGERY  2012   cataract   knee replacement Right 9/11   R. Dr. Telford [name]   RADIOLOGY WITH ANESTHESIA N/A 09/01/2018   Procedure: IR WITH ANESTHESIA;  Surgeon: Monica [name];  Location: [place];  Service: Radiology;  Laterality: N/A;   sleep study  5/05   TONSILLECTOMY     TOTAL ABDOMINAL HYSTERECTOMY     fibroids, age of 29   TOTAL KNEE ARTHROPLASTY Left 2012   US TRANSVAGINAL PELVIC MODIFIED  2000, 2001     Home Medications:  Prior to Admission medications   Medication Sig Start Date End Date Taking? Authorizing Provider  allopurinol (ZYLOPRIM) 100 MG tablet TAKE 2 TABLETS BY MOUTH ONCE A DAY *NEED OFFICE VISIT Patient taking differently: Take 200 mg by mouth daily.  01/03/17  Yes Monica [name]  amLODipine (NORVASC) 5 MG tablet TAKE 1 TABLET (5 MG TOTAL) BY MOUTH DAILY. PLEASE KEEP UPCOMING APPOINTMENT FOR FURTHER REFILLS Patient taking differently: Take 5 mg by mouth daily.  11/21/17  Yes Monica [name]  colchicine 0.6 MG tablet Take 0.6 mg by mouth daily as needed (gout attacks).   Yes Provider, Historical, [name]  docusate sodium (COLACE) 100 MG capsule Take 100 mg by mouth 2 (two) times daily as needed for mild constipation.   Yes Provider, Historical,  [name]  DULoxetine (CYMBALTA) 30 MG capsule Take 30 mg by mouth daily.   Yes Provider, Historical, [name]  furosemide (LASIX) 40 MG tablet TAKE 1 TABLET BY MOUTH DAILY Patient taking differently: Take 40 mg by mouth daily.  10/26/16  Yes Cook, Jayce G, [name]  Magnesium 250 MG TABS Take 250 mg by mouth daily.   Yes Provider, Historical, [name]  meloxicam (MOBIC) 15 MG tablet Take 15 mg by mouth daily. 08/22/18  Yes Provider, Historical, [name]  metoprolol succinate (TOPROL-XL) 100 MG 24 hr tablet TAKE 1 TABLET BY MOUTH DAILY WITH OR IMMEDIATLY FOLLOWING A MEAL Patient taking differently: Take 100 mg by mouth daily.  06/06/16  Yes Cook, Jayce G, [name]  pantoprazole (PROTONIX) 40 MG tablet Take 40 mg by mouth daily before breakfast.  07/29/18  Yes Provider, Historical, [name]  potassium chloride SA (K-DUR,KLOR-CON) 20 MEQ tablet Take 1 tablet (20 mEq total) by mouth daily. 02/26/16  Yes [name], [name]  Vitamin D, Ergocalciferol, (DRISDOL)  1.25 MG (50000 UT) CAPS capsule TAKE 1 CAPSULE (50,000 UNITS TOTAL) BY MOUTH EVERY 7 (SEVEN) DAYS. Patient not taking: Reported on 09/02/2018 01/22/18   Lyndal Pulley, [name]    Inpatient Medications: Scheduled Meds:  allopurinol  200 mg Oral Daily   apixaban  10 mg Oral BID   Followed by   Derrill Memo ON 09/10/2018] apixaban  5 mg Oral BID   chlorhexidine gluconate (MEDLINE KIT)  15 mL Mouth Rinse BID   Chlorhexidine Gluconate Cloth  6 each Topical Daily   insulin aspart  0-9 Units Subcutaneous TID WC   pantoprazole  40 mg Oral Daily   pyridOXINE  100 mg Oral Daily   cyanocobalamin  1,000 mcg Oral Daily   Continuous Infusions:  sodium chloride Stopped (09/02/18 2230)   PRN Meds: acetaminophen **OR** acetaminophen (TYLENOL) oral liquid 160 mg/5 mL **OR** acetaminophen, hydrALAZINE  Allergies:    Allergies  Allergen Reactions   Buspirone Hcl Other (See Comments)    REACTION: made her sleepy, ? swollen legs   Lisinopril Other (See Comments)    REACTION: dizziness   Prozac  [Fluoxetine Hcl] Other (See Comments)    sleepy    Social History:   Social History   Socioeconomic History   Marital status: Widowed    Spouse name: Not on file   Number of children: 1   Years of education: Not on file   Highest education level: Not on file  Occupational History   Occupation: Retired Ship broker: RETIRED  Social Designer, fashion/clothing strain: Not on file   Food insecurity    Worry: Not on file    Inability: Not on file   Transportation needs    Medical: Not on file    Non-medical: Not on file  Tobacco Use   Smoking status: Former Smoker    Packs/day: 0.50    Years: 20.00    Pack years: 10.00    Types: Cigarettes   Smokeless tobacco: Never Used   Tobacco comment: quit approx. 20 years ago  Substance and Sexual Activity   Alcohol use: Yes    Alcohol/week: 0.0 standard drinks    Comment: wine occasional   Drug use: No   Sexual activity: Never  Lifestyle   Physical activity    Days per week: Not on file    Minutes per session: Not on file   Stress: Not on file  Relationships   Social connections    Talks on phone: Not on file    Gets together: Not on file    Attends religious service: Not on file    Active member of club or organization: Not on file    Attends meetings of clubs or organizations: Not on file    Relationship status: Not on file   Intimate partner violence    Fear of current or ex partner: Not on file    Emotionally abused: Not on file    Physically abused: Not on file    Forced sexual activity: Not on file  Other Topics Concern   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   All other ROS reviewed and negative.     Physical Exam/Data:   Vitals:   09/02/18 2319 09/03/18 0331 09/03/18 0700 09/03/18 1100  BP: (!) 151/59 138/61 135/89 (!) 148/97  Pulse: 79 75 (!) 143 (!) 138  Resp: _0 Temp: 98.2 F (36.8 C) 98.6 F (37 C) 98.2 F (36.8 C) 98.6 F (37 C)  TempSrc: Oral Oral Oral Axillary  SpO2: 96% 96% 95% 97%  Weight:      Height:        Intake/Output Summary (Last 24 hours) at 09/03/2018 1354 Last data filed at 09/03/2018 0900 Gross per 24 hour  Intake 1639.78 ml  Output 1950 ml  Net -310.22 ml   Filed Weights   09/01/18 1900  Weight: 113.4 kg   Body mass index is 39.16 kg/[name].  General:  Well nourished, well developed, sitting upright in bedside chair in no acute distress HEENT: sclera anicteric  Neck: no JVD Vascular: No carotid bruits; distal pulses 2+ bilaterally Cardiac:  normal S1, S2; IRIR; no murmurs, rubs, or gallops Lungs:  clear to auscultation bilaterally, no wheezing, rhonchi or rales  Abd: NABS, soft, obese, nontender, no hepatomegaly Ext: trace LE edema Musculoskeletal:  No deformities, moving all extremities Skin: warm and dry  Neuro:  CNs 2-12 intact, some word finding difficulty Psych:  Normal affect   EKG:  The EKG was personally reviewed and demonstrates: atrial flutter (favor over sinus tach) with rate 143 bpm, diffuse ST-T wave abnormalities, no STE Telemetry:  Telemetry was personally reviewed and demonstrates:  Atrial flutter with variable AV block with rates in the 140s since around 8am this morning.   Relevant CV Studies: Echocardiogram 09/02/2018: IMPRESSIONS    1. The left ventricle has hyperdynamic systolic function, with an ejection fraction of >65%. The cavity size was normal. There is  mildly increased left ventricular wall thickness. Left ventricular diastolic Doppler parameters are consistent with  pseudonormalization.  2. The right ventricle has normal systolic function. The cavity was normal. There is no increase in right ventricular wall thickness. Right ventricular systolic pressure could not be assessed.  3. Left atrial size was mild-moderately dilated.  4. The mitral valve is abnormal. There is moderate mitral annular calcification present.  5. No stenosis of the aortic valve.  6. Likely calcified, prominent Coumadin ridge (normal variant landmark between LA appendage and left upper pulmonary vein). There is a 0.8 x 1.4 cm calcified mass like lesion adherent to the lateral LA wall, in the location of the Coumadin ridge (left  lateral ridge). It is non mobile, and likely represents calcified normal variant structure.  7. No intracardiac thrombi or masses were visualized.  Laboratory Data:  Chemistry Recent Labs  Lab 09/01/18 1915 09/01/18 1920  09/02/18 0421 09/02/18 0506 09/03/18 0551  NA 141 141   < > 141 139 141  K 4.2 4.1   < > 3.6 3.8 3.7  CL 105 106  --   --  109 111  CO2 26  --   --   --  21* 22  GLUCOSE 106* 102*  --   --  106* 95  BUN 27* 29*  --   --  26* 19  CREATININE 1.17* 1.10*  --   --  0.93 0.99  CALCIUM 9.3  --   --   --  8.8* 8.8*  GFRNONAA 44*  --   --   --  58* 53*  GFRAA 51*  --   --   --  >60 >60  ANIONGAP 10  --   --   --  9 8   < > = values in this interval not displayed.    Recent Labs  Lab 09/01/18 1915  PROT 6.8  ALBUMIN 3.6  AST 22  ALT 16  ALKPHOS 99  BILITOT 0.5   Hematology Recent Labs  Lab 09/01/18 1915  09/02/18 0421 09/02/18 0506 09/03/18 0551  WBC 7.5  --   --  7.5 7.5  RBC 4.28  --   --  3.55* 3.55*  HGB 11.0*   < > 9.9* 9.1* 9.0*  HCT 36.0   < > 29.0* 29.1* 29.7*  MCV 84.1  --   --  82.0 83.7  MCH 25.7*  --   --  25.6* 25.4*  MCHC 30.6  --   --  31.3 30.3  RDW 17.6*  --   --  17.7* 17.8*  PLT 279   --   --  247 255   < > = values in this interval not displayed.   Cardiac EnzymesNo results for input(s): TROPONINI in the last 168 hours. No results for input(s): TROPIPOC in the last 168 hours.  BNPNo results for input(s): BNP, PROBNP in the last 168 hours.  DDimer No results for input(s): DDIMER in the last 168 hours.  Radiology/Studies:  Ct Angio Head W Or Wo Contrast  Result Date: 09/01/2018 CLINICAL DATA:  Code stroke focal neurologic deficit EXAM: CT ANGIOGRAPHY HEAD AND NECK TECHNIQUE: Multidetector CT imaging of the head and neck was performed using the standard protocol during bolus administration of intravenous contrast. Multiplanar CT image reconstructions and MIPs were obtained to evaluate the vascular anatomy. Carotid stenosis measurements (when applicable) are obtained utilizing NASCET criteria, using the distal internal carotid diameter as the denominator. CONTRAST:  51m OMNIPAQUE IOHEXOL 350 MG/ML SOLN COMPARISON:  Head CT 09/01/2018 FINDINGS: CTA NECK FINDINGS SKELETON: There is no bony spinal canal stenosis. No lytic or blastic lesion. OTHER NECK: Normal pharynx, larynx and major salivary glands. No cervical lymphadenopathy. Unremarkable thyroid gland. UPPER CHEST: No pneumothorax or pleural effusion. No nodules or masses. AORTIC ARCH: There is mild calcific atherosclerosis of the aortic arch. There is no aneurysm, dissection or hemodynamically significant stenosis of the visualized ascending aorta and aortic arch. Conventional 3 vessel aortic branching pattern. The visualized proximal subclavian arteries are widely patent. RIGHT CAROTID SYSTEM: --Common carotid artery: Widely patent origin without common carotid artery dissection or aneurysm. --Internal carotid artery: No dissection, occlusion or aneurysm. There is mixed density atherosclerosis extending into the proximal ICA, resulting in less than 50% stenosis. --External carotid artery: No acute abnormality. LEFT CAROTID SYSTEM:  --Common carotid artery: Widely patent origin without common carotid artery dissection or aneurysm. --Internal carotid artery: No dissection, occlusion or aneurysm. There is mixed density atherosclerosis extending into the proximal ICA, resulting in less than 50% stenosis. --External carotid artery: No acute abnormality. VERTEBRAL ARTERIES: Right dominant configuration. Both origins are clearly patent. No dissection, occlusion or flow-limiting stenosis to the skull base (V1-V3 segments). CTA HEAD FINDINGS POSTERIOR CIRCULATION: --  Vertebral arteries: Normal V4 segments. --Posterior inferior cerebellar arteries (PICA): Patent origins from the vertebral arteries. --Anterior inferior cerebellar arteries (AICA): Patent origins from the basilar artery. --Basilar artery: Normal. --Superior cerebellar arteries: Normal. --Posterior cerebral arteries (PCA): Normal. Both originate from the basilar artery. Posterior communicating arteries (p-comm) are diminutive or absent. ANTERIOR CIRCULATION: --Intracranial internal carotid arteries: Normal. --Anterior cerebral arteries (ACA): Normal. Both A1 segments are present. Patent anterior communicating artery (a-comm). --Middle cerebral arteries (MCA): There is occlusion of the distal M1 segment of the left middle cerebral artery. There is intermediate collateralization. The right MCA is normal. VENOUS SINUSES: As permitted by contrast timing, patent. ANATOMIC VARIANTS: None Review of the MIP images confirms the above findings. IMPRESSION: 1. 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 2. Bilateral carotid bifurcation atherosclerosis with less than 50% stenosis by NASCET criteria. 3.  Aortic atherosclerosis (ICD10-I70.0). Critical Value/emergent results were called by telephone at the time of interpretation on 09/01/2018 at 7:48 pm to Dr. Samara Snide , who verbally acknowledged these results.  Electronically Signed   By: Ulyses Jarred [name].D.   On: 09/01/2018 19:48   Ct Angio Neck W Or Wo Contrast  Result Date: 09/01/2018 CLINICAL DATA:  Code stroke focal neurologic deficit EXAM: CT ANGIOGRAPHY HEAD AND NECK TECHNIQUE: Multidetector CT imaging of the head and neck was performed using the standard protocol during bolus administration of intravenous contrast. Multiplanar CT image reconstructions and MIPs were obtained to evaluate the vascular anatomy. Carotid stenosis measurements (when applicable) are obtained utilizing NASCET criteria, using the distal internal carotid diameter as the denominator. CONTRAST:  57m OMNIPAQUE IOHEXOL 350 MG/ML SOLN COMPARISON:  Head CT 09/01/2018 FINDINGS: CTA NECK FINDINGS SKELETON: There is no bony spinal canal stenosis. No lytic or blastic lesion. OTHER NECK: Normal pharynx, larynx and major salivary glands. No cervical lymphadenopathy. Unremarkable thyroid gland. UPPER CHEST: No pneumothorax or pleural effusion. No nodules or masses. AORTIC ARCH: There is mild calcific atherosclerosis of the aortic arch. There is no aneurysm, dissection or hemodynamically significant stenosis of the visualized ascending aorta and aortic arch. Conventional 3 vessel aortic branching pattern. The visualized proximal subclavian arteries are widely patent. RIGHT CAROTID SYSTEM: --Common carotid artery: Widely patent origin without common carotid artery dissection or aneurysm. --Internal carotid artery: No dissection, occlusion or aneurysm. There is mixed density atherosclerosis extending into the proximal ICA, resulting in less than 50% stenosis. --External carotid artery: No acute abnormality. LEFT CAROTID SYSTEM: --Common carotid artery: Widely patent origin without common carotid artery dissection or aneurysm. --Internal carotid artery: No dissection, occlusion or aneurysm. There is mixed density atherosclerosis extending into the proximal ICA, resulting in less than 50% stenosis.  --External carotid artery: No acute abnormality. VERTEBRAL ARTERIES: Right dominant configuration. Both origins are clearly patent. No dissection, occlusion or flow-limiting stenosis to the skull base (V1-V3 segments). CTA HEAD FINDINGS POSTERIOR CIRCULATION: --Vertebral arteries: Normal V4 segments. --Posterior inferior cerebellar arteries (PICA): Patent origins from the vertebral arteries. --Anterior inferior cerebellar arteries (AICA): Patent origins from the basilar artery. --Basilar artery: Normal. --Superior cerebellar arteries: Normal. --Posterior cerebral arteries (PCA): Normal. Both originate from the basilar artery. Posterior communicating arteries (p-comm) are diminutive or absent. ANTERIOR CIRCULATION: --Intracranial internal carotid arteries: Normal. --Anterior cerebral arteries (ACA): Normal. Both A1 segments are present. Patent anterior communicating artery (a-comm). --Middle cerebral arteries (MCA): There is occlusion of the distal M1 segment of the left middle cerebral artery. There is intermediate collateralization. The right MCA  is normal. VENOUS SINUSES: As permitted by contrast timing, patent. ANATOMIC VARIANTS: None Review of the MIP images confirms the above findings. IMPRESSION: 1. 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 2. Bilateral carotid bifurcation atherosclerosis with less than 50% stenosis by NASCET criteria. 3.  Aortic atherosclerosis (ICD10-I70.0). Critical Value/emergent results were called by telephone at the time of interpretation on 09/01/2018 at 7:48 pm to Dr. Samara Snide , who verbally acknowledged these results. Electronically Signed   By: Ulyses Jarred [name].D.   On: 09/01/2018 19:48   Mr Brain Wo Contrast  Result Date: 09/02/2018 CLINICAL DATA:  Right-sided facial weakness and arm weakness EXAM: MRI HEAD WITHOUT CONTRAST TECHNIQUE: Multiplanar, multiecho pulse sequences of the brain  and surrounding structures were obtained without intravenous contrast. COMPARISON:  CTA head neck 09/01/2018 FINDINGS: BRAIN: There is no acute infarct, acute hemorrhage or extra-axial collection. A partially empty sella is incidentally noted. There are multiple small areas of abnormal diffusion restriction within the left MCA territory, including the left corpus striatum and multiple subcortical locations of the left frontal and parietal lobes. No associated mass effect or midline shift. No acute hemorrhage. The cerebral and cerebellar volume are age-appropriate. There is no hydrocephalus. Foci of chronic microhemorrhage within the posterior left parietal lobe and lower right brainstem. VASCULAR: The major intracranial arterial and venous sinus flow voids are normal. SKULL AND UPPER CERVICAL SPINE: Calvarial bone marrow signal is normal. There is no skull base mass. The visualized upper cervical spine and soft tissues are normal. SINUSES/ORBITS: There are no fluid levels or advanced mucosal thickening. The mastoid air cells and middle ear cavities are free of fluid. The orbits are normal. IMPRESSION: 1. Multiple small foci early subacute ischemia within the left MCA territory without acute hemorrhage or mass effect. 2. Chronic microhemorrhages of the lower brainstem and left parietal lobe. Electronically Signed   By: Ulyses Jarred [name].D.   On: 09/02/2018 17:48   Dg Chest Port 1 View  Result Date: 09/01/2018 CLINICAL DATA:  Status post intubation EXAM: PORTABLE CHEST 1 VIEW COMPARISON:  12/12/2016 FINDINGS: Cardiac shadow is stable. Endotracheal tube is noted in satisfactory position. Lungs are clear bilaterally. No bony abnormality is seen. IMPRESSION: Endotracheal tube in satisfactory position. No acute infiltrate is noted. Electronically Signed   By: Inez Catalina [name].D.   On: 09/01/2018 23:48   Ct Head Code Stroke Wo Contrast  Result Date: 09/01/2018 CLINICAL DATA:  Code stroke. Focal neurologic deficit for  less than 6 hours EXAM: CT HEAD WITHOUT CONTRAST TECHNIQUE: Contiguous axial images were obtained from the base of the skull through the vertex without intravenous contrast. COMPARISON:  None. FINDINGS: Brain: There is no mass, hemorrhage or extra-axial collection. The size and configuration of the ventricles and extra-axial CSF spaces are normal. There is mild white matter hypoattenuation. Partially empty sella is incidentally noted Vascular: No abnormal hyperdensity of the major intracranial arteries or dural venous sinuses. No intracranial atherosclerosis. Skull: The visualized skull base, calvarium and extracranial soft tissues are normal. Sinuses/Orbits: No fluid levels or advanced mucosal thickening of the visualized paranasal sinuses. No mastoid or middle ear effusion. The orbits are normal. ASPECTS [facility] Stroke Program Early CT Score) - Ganglionic level infarction (caudate, lentiform nuclei, internal capsule, insula, M1-M3 cortex): 7 - Supraganglionic infarction (M4-M6 cortex): 3 Total score (0-10 with 10 being normal): 10 IMPRESSION: 1. No acute intracranial abnormality. 2. ASPECTS is 10. These results were communicated to Dr.  Sushanth Aroor at 7:32 pm on 09/01/2018 by text page via the [facility] messaging system. Electronically Signed   By: Ulyses Jarred [name].D.   On: 09/01/2018 19:33   Vas [place] Lower Extremity Venous (dvt)  Result Date: 09/02/2018  Lower Venous Study Indications: Stroke.  Comparison Study: No prior study on file for comparison. Performing Technologist: Sharion Dove RVS  Examination Guidelines: A complete evaluation includes B-mode imaging, spectral Doppler, color Doppler, and power Doppler as needed of all accessible portions of each vessel. Bilateral testing is considered an integral part of a complete examination. Limited examinations for reoccurring indications may be performed as noted.  +---------+---------------+---------+-----------+----------+-------+  RIGHT      Compressibility Phasicity Spontaneity Properties Summary  +---------+---------------+---------+-----------+----------+-------+  CFV       Full            Yes       Yes                             +---------+---------------+---------+-----------+----------+-------+  SFJ       Full                                                      +---------+---------------+---------+-----------+----------+-------+  FV Prox   Full                                                      +---------+---------------+---------+-----------+----------+-------+  FV Mid    Full                                                      +---------+---------------+---------+-----------+----------+-------+  FV Distal Full                                                      +---------+---------------+---------+-----------+----------+-------+  PFV       Full                                                      +---------+---------------+---------+-----------+----------+-------+  POP       Full            Yes       Yes                             +---------+---------------+---------+-----------+----------+-------+  PTV       None                                             Acute    +---------+---------------+---------+-----------+----------+-------+  PERO      None                                             Acute    +---------+---------------+---------+-----------+----------+-------+   +---------+---------------+---------+-----------+----------+-------+  LEFT      Compressibility Phasicity Spontaneity Properties Summary  +---------+---------------+---------+-----------+----------+-------+  CFV       Full            Yes       Yes                             +---------+---------------+---------+-----------+----------+-------+  SFJ       Full                                                      +---------+---------------+---------+-----------+----------+-------+  FV Prox   Full                                                       +---------+---------------+---------+-----------+----------+-------+  FV Mid    Full                                                      +---------+---------------+---------+-----------+----------+-------+  FV Distal Full                                                      +---------+---------------+---------+-----------+----------+-------+  PFV       Full                                                      +---------+---------------+---------+-----------+----------+-------+  POP       None            No        No                     Acute    +---------+---------------+---------+-----------+----------+-------+  PTV       Full                                                      +---------+---------------+---------+-----------+----------+-------+  PERO      None  Acute    +---------+---------------+---------+-----------+----------+-------+     Summary: Right: Findings consistent with acute deep vein thrombosis involving the right posterior tibial veins, and right peroneal veins. Left: Findings consistent with acute deep vein thrombosis involving the left popliteal vein, and left peroneal veins.  *See table(s) above for measurements and observations.    Preliminary     Assessment and Plan:   1. New onset atrial flutter: patient with HR in the 140s since 8am. EKG with atrial flutter with rate 143bpm. Tele with atrial flutter with variable AV block with HR predominantly in the 140s. She reports episodes of dizziness and palpitations 2-3 days per week which last for minutes-hours. Possible she has been experiencing paroxysmal atrial flutter/fib prior to admission and this contributed to her stroke. Additionally her BBlocker was held to allow for permissive hypertension and could be contributing to her tachycardia. Echo this admission with EF >65%, G2DD, and mild-moderate LAE.  - This patients CHA2DS2-VASc Score and unadjusted Ischemic Stroke Rate (% per year) is equal  to 11.2% stroke risk/year from a score of 8 Above score calculated as 1 point each if present [CHF, HTN, DM, Vascular=MI/PAD/Aortic Plaque, Age if 65-74, or Female] Above score calculated as 2 points each if present [Age > 75, or Stroke/TIA/TE] - Started on apixaban for DVT - will need to continue lifelong therapy after completion of DVT course for stroke ppx - Metoprolol restarted as tartrate 464m BID (home 1063msuccinate daily) - will monitor overnight to determine need for uptitration/additional medications - Will check a TSH in the morning to complete work-up  2. Acute stroke: acute ischemic stroke in L MCA without hemorrhage or mass effect. She was given tPA and underwent thrombectomy with IR. Briefly intubated following the procedure given oral/nasal bleeding after tPA. Still with some word finding difficulty today, but overall improved.  - Continue management per neurology  3. Bilateral DVTs: noted on doppler studies yesterday. Felt to be unprovoked. Started on apixaban 64m46mID  - Continue management per primary team  4. HTN: BP overall stable - allowing for some permissive HTN following acute stroke. Home metoprolol, amlodipine, and lasix held on admission.  - Metoprolol restarted as tartrate 54m60mD - Resume home amlodipine and lasix as BP will allow - favor holding off until improvement in #1  5. Chronic diastolic CHF: Echo with EF 65% and G2DD this admission. She has chronic DOE and LEE. She appears at baseline on exam. UOP is net +1.3L this admission. - Continue to monitor volume status closely and resume home lasix as BP will allow.   5. HLD: LDL 66, triglycerides 278 this admission. Not on statin therapy - Continue dietary modifications to lower cholesterol and routine outpatient monitoring  6. OSA: not compliant with CPAP. Could be contributing to #1. We discussed risks of unmanaged OSA and she is agreeable to undergoing a repeat sleep study in an effort to obtain a new CPAP  machine - Would benefit from a sleep study after discharge.    For questions or updates, please contact [facility] consult [email or web address] for contact info under Cardiology/STEMI.   Signed, KrisAbigail Butts-C  09/03/2018 1:54 PM [phone]

## 2018-09-03 NOTE — Evaluation (Signed)
Speech Language Pathology Evaluation Patient Details Name: Monica [name] MRN: [id number] DOB: May 23, 1935 Today's Date: 09/03/2018 Time: 2536-6440 SLP Time Calculation (min) (ACUTE ONLY): 36 min  Problem List:  Patient Active Problem List   Diagnosis Date Noted  . Acute ischemic left MCA stroke ([place]) 09/01/2018  . Middle cerebral artery embolism, 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  . Degenerative lumbar spinal stenosis 09/20/2017  . Lumbar radiculopathy 07/31/2017  . Lower GI bleed 10/03/2016  . Diverticulosis of colon with hemorrhage   . Status post bilateral knee replacements 05/04/2016  . Chronic cough 04/15/2016  . Seborrheic keratoses 04/15/2016  . Cervical disc disorder with radiculopathy of cervical region 12/01/2015  . Partial nontraumatic rupture of right rotator cuff 09/22/2015  . Compulsive overeating 01/01/2015  . Chronic diastolic heart failure (HCC) 12/12/2012    Class: Chronic  . Urge incontinence of urine 09/09/2011  . Knee osteoarthritis 02/09/2010  . Gout 03/20/2008  . Hyperlipidemia 12/21/2006  . Depression with anxiety 12/21/2006  . Essential hypertension 12/21/2006  . Obstructive sleep apnea 12/21/2006   Past Medical History:  Past Medical History:  Diagnosis Date  . Allergic rhinitis   . Anxiety   . Benign neoplasm of breast 2013  . Cardiomegaly   . Compulsive overeating   . Depression   . Diffuse cystic mastopathy 2013  . Edema   . GI bleeding   . Gout   . HTN (hypertension)   . Hyperlipidemia   . Lump or mass in breast 2012  . OA (osteoarthritis) of knee    with injections  . Obesity   . Other abnormal glucose   . Personal history of tobacco use, presenting hazards to health   . Sciatica   . Sinus infection 2010  . Unspecified sleep apnea    Past Surgical History:  Past Surgical History:  Procedure Laterality Date  . adenosine cardiolite  1/08   low risk   . APPENDECTOMY    . BREAST LUMPECTOMY     right x1 ('89) left x2 ('70s, '90)  . CARDIAC  CATHETERIZATION  2001   normal per pt  . COLONOSCOPY  11/02   diverticulosis  . COLONOSCOPY  9/06   diverticulosis, hemorhoids  . COLONOSCOPY N/A 10/01/2016   Procedure: COLONOSCOPY;  Surgeon: Gatha Mayer, MD;  Location: [facility];  Service: Endoscopy;  Laterality: N/A;  . dexa  3/02   normal  . EYE SURGERY  2012   cataract  . knee replacement Right 9/11   R. Dr. Telford Nab  . RADIOLOGY WITH ANESTHESIA N/A 09/01/2018   Procedure: IR WITH ANESTHESIA;  Surgeon: Luanne Bras, MD;  Location: [place];  Service: Radiology;  Laterality: N/A;  . sleep study  5/05  . TONSILLECTOMY    . TOTAL ABDOMINAL HYSTERECTOMY     fibroids, age of 16  . TOTAL KNEE ARTHROPLASTY Left 2012  . US TRANSVAGINAL PELVIC MODIFIED  2000, 2001   HPI:  Pt is an 83 y.o. female with past medical history of hypertension, hyperlipidemia, obesity, remote GI bleeding who presented to the emergency room as a code stroke for sudden onset aphasia and right hemiparesis. IV tPA was administered and IR for mechanical thrombectomy completed. MRI of the brain showed multiple small foci early subacute ischemia within the left MCA territory without acute hemorrhage or mass effect.   Assessment / Plan / Recommendation Clinical Impression  Pt reported that she was living independently prior to admission but her daughter lives nearby. She reported that prior to admission she had difficulty with  word retrieval and that her family/friends frequently made fun of her due to this. For example, she cited an incident prior to admission when she accidentally called her daughter, Wynonia Lawman, "tomato".  Pt currently presents with mild aphasia characterized by impaired receptive and expressive language skills. She demonstrated mild word retrieval during conversation but stated that this may be at her baseline. Receptively, she exhibited difficulty following 3-step commands and with auditory comprehension of paragraph-level material. Reading  comprehension was adequate at the paragraph level but she reported the need for additional processing time. Pt's motor speech skills were within normal limits during this assessment and reliable assessment of her cognition was difficulty due to her demonstrated impairments in auditory comprehension. Skilled SLP services are clinically indicated at this time to improve language.     SLP Assessment  SLP Recommendation/Assessment: Patient needs continued Speech Lanaguage Pathology Services SLP Visit Diagnosis: Aphasia (R47.01)    Follow Up Recommendations  Home health SLP;24 hour supervision/assistance    Frequency and Duration min 2x/week  2 weeks      SLP Evaluation Cognition  Overall Cognitive Status: Difficult to assess(Due to receptive language deficits) Arousal/Alertness: Awake/alert Orientation Level: Oriented to person;Oriented to place;Oriented to situation(Oriented to date, month, year not day (stated Tuesday)) Attention: Focused;Sustained Focused Attention: Appears intact Sustained Attention: Appears intact Memory: Impaired Memory Impairment: Retrieval deficit;Decreased recall of new information;Storage deficit(Immediate: 3/3; delayed: 1/3; with cues: 1/2) Problem Solving: Appears intact       Comprehension  Auditory Comprehension Overall Auditory Comprehension: Impaired Yes/No Questions: Within Functional Limits Basic Biographical Questions: (5/5) Complex Questions: (4/5) Paragraph Comprehension (via yes/no questions): (1/4) Commands: Impaired Two Step Basic Commands: (4/4) Multistep Basic Commands: (2/4; 4/4 with repetition) Visual Recognition/Discrimination Discrimination: Within Function Limits Reading Comprehension Reading Status: Within funtional limits    Expression Expression Primary Mode of Expression: Verbal Verbal Expression Overall Verbal Expression: Impaired at baseline Initiation: No impairment Automatic Speech: Counting;Day of week;Month of  year(WNL) Level of Generative/Spontaneous Verbalization: Conversation Repetition: No impairment(4/5) Naming: No impairment Responsive: (5/5) Confrontation: Within functional limits(10/10) Convergent: (Sentence completion: 5/5) Pragmatics: No impairment Written Expression Dominant Hand: Right   Oral / Motor  Oral Motor/Sensory Function Overall Oral Motor/Sensory Function: Within functional limits Motor Speech Overall Motor Speech: Appears within functional limits for tasks assessed Respiration: Within functional limits Phonation: Normal Resonance: Within functional limits Articulation: Within functional limitis Intelligibility: Intelligible Motor Planning: Witnin functional limits Motor Speech Errors: Not applicable   Cassandra Mcmanaman I. Hardin Negus, Lillian, [place] Office number [phone] Pager [place] 09/03/2018, 4:31 PM

## 2018-09-03 NOTE — Progress Notes (Signed)
Telemetry called to report pt had 4.32sec pause on the monitor. MD on called paged and notified; no new order received; awaiting on response from MD. Will continue to closely monitor. Delia Heady RN

## 2018-09-04 ENCOUNTER — Encounter (HOSPITAL_COMMUNITY): Payer: Self-pay | Admitting: Interventional Radiology

## 2018-09-04 ENCOUNTER — Other Ambulatory Visit (HOSPITAL_COMMUNITY): Payer: Medicare Other

## 2018-09-04 DIAGNOSIS — I4891 Unspecified atrial fibrillation: Secondary | ICD-10-CM

## 2018-09-04 DIAGNOSIS — I82433 Acute embolism and thrombosis of popliteal vein, bilateral: Secondary | ICD-10-CM

## 2018-09-04 DIAGNOSIS — I5032 Chronic diastolic (congestive) heart failure: Secondary | ICD-10-CM

## 2018-09-04 DIAGNOSIS — I6602 Occlusion and stenosis of left middle cerebral artery: Secondary | ICD-10-CM

## 2018-09-04 DIAGNOSIS — I483 Typical atrial flutter: Secondary | ICD-10-CM

## 2018-09-04 DIAGNOSIS — I484 Atypical atrial flutter: Secondary | ICD-10-CM

## 2018-09-04 DIAGNOSIS — E669 Obesity, unspecified: Secondary | ICD-10-CM

## 2018-09-04 LAB — BASIC METABOLIC PANEL
Anion gap: 9 (ref 5–15)
BUN: 13 mg/dL (ref 8–23)
CO2: 22 mmol/L (ref 22–32)
Calcium: 9 mg/dL (ref 8.9–10.3)
Chloride: 111 mmol/L (ref 98–111)
Creatinine, Ser: 0.96 mg/dL (ref 0.44–1.00)
GFR calc Af Amer: >60 mL/min (ref >60)
GFR calc non Af Amer: 55 mL/min — ABNORMAL LOW (ref >60)
Glucose, Bld: 98 mg/dL (ref 70–99)
Potassium: 3.6 mmol/L (ref 3.5–5.1)
Sodium: 142 mmol/L (ref 135–145)

## 2018-09-04 LAB — CBC
HCT: 30.6 % — ABNORMAL LOW (ref 36.0–46.0)
Hemoglobin: 9.4 g/dL — ABNORMAL LOW (ref 12.0–15.0)
MCH: 25.4 pg — ABNORMAL LOW (ref 26.0–34.0)
MCHC: 30.7 g/dL (ref 30.0–36.0)
MCV: 82.7 fL (ref 80.0–100.0)
Platelets: 253 10*3/uL (ref 150–400)
RBC: 3.7 MIL/uL — ABNORMAL LOW (ref 3.87–5.11)
RDW: 18.1 % — ABNORMAL HIGH (ref 11.5–15.5)
WBC: 8 10*3/uL (ref 4.0–10.5)
nRBC: 0 % (ref 0.0–0.2)

## 2018-09-04 LAB — GLUCOSE, CAPILLARY
Glucose-Capillary: 96 mg/dL (ref 70–99)
Glucose-Capillary: 96 mg/dL (ref 70–99)
Glucose-Capillary: 87 mg/dL (ref 70–99)
Glucose-Capillary: 115 mg/dL — ABNORMAL HIGH (ref 70–99)

## 2018-09-04 LAB — TSH: TSH: 2.233 u[IU]/mL (ref 0.350–4.500)

## 2018-09-04 MED ORDER — AMIODARONE HCL IN DEXTROSE 360-4.14 MG/200ML-% IV SOLN
30.0000 mg/h | INTRAVENOUS | Status: DC
  Filled 2018-09-04 (×3): qty 200

## 2018-09-04 MED ORDER — AMIODARONE HCL IN DEXTROSE 360-4.14 MG/200ML-% IV SOLN
60.0000 mg/h | INTRAVENOUS | Status: AC
  Administered 2018-09-04: 60 mg/h via INTRAVENOUS
  Filled 2018-09-04: qty 200

## 2018-09-04 MED ORDER — AMIODARONE LOAD VIA INFUSION
150.0000 mg | Freq: Once | INTRAVENOUS | Status: AC
  Administered 2018-09-04: 150 mg via INTRAVENOUS
  Filled 2018-09-04: qty 83.34

## 2018-09-04 NOTE — Progress Notes (Signed)
Notified by RN regarding a 4.3 second pause on telemetry. Patient was asleep at time of the pause suggestive of increased vagal tone playing a role. Has been in a-fib/flutter today, but after the pause, her monitor exhibits sinus tachy with rate of 100. She was restarted on a beta blocker today for management of atrial flutter.   Discussed with Cardiology Fellow on call. Will continue nodal blockade with beta blocker for now. If pause of > 4 sec recurs, will hold beta blocker and Cardiology will need to be reconsulted in the AM regarding other medication strategies for rate control while limiting the risk of pauses.   Electronically signed: Dr. Kerney Elbe

## 2018-09-04 NOTE — Progress Notes (Signed)
Progress Note  Patient Name: Monica [name] Date of Encounter: 09/04/2018  Primary Cardiologist: Sinclair Grooms, MD   Subjective   Has been out walking this morning despite heart rate of 140.  Has some dyspnea on exertion which she says is not different than typical.  She does not have palpitations.  She denies chest pain.  Inpatient Medications    Scheduled Meds: . allopurinol  200 mg Oral Daily  . apixaban  10 mg Oral BID   Followed by  . [START ON 09/10/2018] apixaban  5 mg Oral BID  . chlorhexidine gluconate (MEDLINE KIT)  15 mL Mouth Rinse BID  . Chlorhexidine Gluconate Cloth  6 each Topical Daily  . insulin aspart  0-9 Units Subcutaneous TID WC  . metoprolol tartrate  50 mg Oral BID  . pantoprazole  40 mg Oral Daily  . pyridOXINE  100 mg Oral Daily  . cyanocobalamin  1,000 mcg Oral Daily   Continuous Infusions: . sodium chloride Stopped (09/02/18 2230)   PRN Meds: acetaminophen **OR** acetaminophen (TYLENOL) oral liquid 160 mg/5 mL **OR** acetaminophen, hydrALAZINE   Vital Signs    Vitals:   09/03/18 1935 09/04/18 0016 09/04/18 0358 09/04/18 0700  BP: (!) 157/103 (!) 150/101 (!) 147/78 113/84  Pulse: (!) 131 (!) 132 62 (!) 145  Resp: '18 18 18 18  ' Temp: 98.7 F (37.1 C) 98.3 F (36.8 C) 98.7 F (37.1 C) 97.8 F (36.6 C)  TempSrc: Oral Oral Oral Oral  SpO2: 97% 96% 97% 99%  Weight:      Height:        Intake/Output Summary (Last 24 hours) at 09/04/2018 1143 Last data filed at 09/04/2018 0700 Gross per 24 hour  Intake 222 ml  Output 1450 ml  Net -1228 ml   Last 3 Weights 09/01/2018 04/12/2018 11/07/2017  Weight (lbs) 250 lb 247 lb 243 lb 12.8 oz  Weight (kg) 113.4 kg 112.038 kg 110.587 kg      Telemetry    Atrial flutter with heart rates persisting at around 148 bpm at times during the night heart rates decreased to an average of approximately 100 bpm.  One episode of 3-1/2-second pause last evening.- Personally Reviewed  ECG    Tracing on  09/04/2018, atrial flutter with variable ventricular response...- Personally Reviewed  Physical Exam  Obese GEN: No acute distress.   Neck: No JVD Cardiac: RRR, no murmurs, rubs, or gallops.  Respiratory: Faint basilar crackles GI: Soft, nontender, non-distended  MS: No edema; No deformity. Neuro:  Nonfocal  Psych: Normal affect   Labs    High Sensitivity Troponin:  No results for input(s): TROPONINIHS in the last 720 hours.    Cardiac EnzymesNo results for input(s): TROPONINI in the last 168 hours. No results for input(s): TROPIPOC in the last 168 hours.   Chemistry Recent Labs  Lab 09/01/18 1915  09/02/18 0506 09/03/18 0551 09/04/18 0458  NA 141   < > 139 141 142  K 4.2   < > 3.8 3.7 3.6  CL 105   < > 109 111 111  CO2 26  --  21* 22 22  GLUCOSE 106*   < > 106* 95 98  BUN 27*   < > 26* 19 13  CREATININE 1.17*   < > 0.93 0.99 0.96  CALCIUM 9.3  --  8.8* 8.8* 9.0  PROT 6.8  --   --   --   --   ALBUMIN 3.6  --   --   --   --  AST 22  --   --   --   --   ALT 16  --   --   --   --   ALKPHOS 99  --   --   --   --   BILITOT 0.5  --   --   --   --   GFRNONAA 44*  --  58* 53* 55*  GFRAA 51*  --  >60 >60 >60  ANIONGAP 10  --  '9 8 9   ' < > = values in this interval not displayed.     Hematology Recent Labs  Lab 09/02/18 0506 09/03/18 0551 09/04/18 0458  WBC 7.5 7.5 8.0  RBC 3.55* 3.55* 3.70*  HGB 9.1* 9.0* 9.4*  HCT 29.1* 29.7* 30.6*  MCV 82.0 83.7 82.7  MCH 25.6* 25.4* 25.4*  MCHC 31.3 30.3 30.7  RDW 17.7* 17.8* 18.1*  PLT 247 255 253    BNPNo results for input(s): BNP, PROBNP in the last 168 hours.   DDimer No results for input(s): DDIMER in the last 168 hours.   Radiology    Mr Brain Wo Contrast  Result Date: 09/02/2018 CLINICAL DATA:  Right-sided facial weakness and arm weakness EXAM: MRI HEAD WITHOUT CONTRAST TECHNIQUE: Multiplanar, multiecho pulse sequences of the brain and surrounding structures were obtained without intravenous contrast. COMPARISON:   CTA head neck 09/01/2018 FINDINGS: BRAIN: There is no acute infarct, acute hemorrhage or extra-axial collection. A partially empty sella is incidentally noted. There are multiple small areas of abnormal diffusion restriction within the left MCA territory, including the left corpus striatum and multiple subcortical locations of the left frontal and parietal lobes. No associated mass effect or midline shift. No acute hemorrhage. The cerebral and cerebellar volume are age-appropriate. There is no hydrocephalus. Foci of chronic microhemorrhage within the posterior left parietal lobe and lower right brainstem. VASCULAR: The major intracranial arterial and venous sinus flow voids are normal. SKULL AND UPPER CERVICAL SPINE: Calvarial bone marrow signal is normal. There is no skull base mass. The visualized upper cervical spine and soft tissues are normal. SINUSES/ORBITS: There are no fluid levels or advanced mucosal thickening. The mastoid air cells and middle ear cavities are free of fluid. The orbits are normal. IMPRESSION: 1. Multiple small foci early subacute ischemia within the left MCA territory without acute hemorrhage or mass effect. 2. Chronic microhemorrhages of the lower brainstem and left parietal lobe. Electronically Signed   By: Ulyses Jarred M.D.   On: 09/02/2018 17:48   Vas [place] Lower Extremity Venous (dvt)  Result Date: 09/03/2018  Lower Venous Study Indications: Stroke.  Comparison Study: No prior study on file for comparison. Performing Technologist: Sharion Dove RVS  Examination Guidelines: A complete evaluation includes B-mode imaging, spectral Doppler, color Doppler, and power Doppler as needed of all accessible portions of each vessel. Bilateral testing is considered an integral part of a complete examination. Limited examinations for reoccurring indications may be performed as noted.  +---------+---------------+---------+-----------+----------+-------+ RIGHT     CompressibilityPhasicitySpontaneityPropertiesSummary +---------+---------------+---------+-----------+----------+-------+ CFV      Full           Yes      Yes                          +---------+---------------+---------+-----------+----------+-------+ SFJ      Full                                                 +---------+---------------+---------+-----------+----------+-------+  FV Prox  Full                                                 +---------+---------------+---------+-----------+----------+-------+ FV Mid   Full                                                 +---------+---------------+---------+-----------+----------+-------+ FV DistalFull                                                 +---------+---------------+---------+-----------+----------+-------+ PFV      Full                                                 +---------+---------------+---------+-----------+----------+-------+ POP      Full           Yes      Yes                          +---------+---------------+---------+-----------+----------+-------+ PTV      None                                         Acute   +---------+---------------+---------+-----------+----------+-------+ PERO     None                                         Acute   +---------+---------------+---------+-----------+----------+-------+   +---------+---------------+---------+-----------+----------+-------+ LEFT     CompressibilityPhasicitySpontaneityPropertiesSummary +---------+---------------+---------+-----------+----------+-------+ CFV      Full           Yes      Yes                          +---------+---------------+---------+-----------+----------+-------+ SFJ      Full                                                 +---------+---------------+---------+-----------+----------+-------+ FV Prox  Full                                                  +---------+---------------+---------+-----------+----------+-------+ FV Mid   Full                                                 +---------+---------------+---------+-----------+----------+-------+ FV DistalFull                                                 +---------+---------------+---------+-----------+----------+-------+  PFV      Full                                                 +---------+---------------+---------+-----------+----------+-------+ POP      None           No       No                   Acute   +---------+---------------+---------+-----------+----------+-------+ PTV      Full                                                 +---------+---------------+---------+-----------+----------+-------+ PERO     None                                         Acute   +---------+---------------+---------+-----------+----------+-------+     Summary: Right: Findings consistent with acute deep vein thrombosis involving the right posterior tibial veins, and right peroneal veins. Left: Findings consistent with acute deep vein thrombosis involving the left popliteal vein, and left peroneal veins.  *See table(s) above for measurements and observations. Electronically signed by Monica Martinez MD on 09/03/2018 at 5:10:15 PM.    Final     Cardiac Studies   2D Doppler echocardiogram 09/02/2018: IMPRESSIONS    1. The left ventricle has hyperdynamic systolic function, with an ejection fraction of >65%. The cavity size was normal. There is mildly increased left ventricular wall thickness. Left ventricular diastolic Doppler parameters are consistent with  pseudonormalization.  2. The right ventricle has normal systolic function. The cavity was normal. There is no increase in right ventricular wall thickness. Right ventricular systolic pressure could not be assessed.  3. Left atrial size was mild-moderately dilated.  4. The mitral valve is abnormal. There is moderate  mitral annular calcification present.  5. No stenosis of the aortic valve.  6. Likely calcified, prominent Coumadin ridge (normal variant landmark between LA appendage and left upper pulmonary vein). There is a 0.8 x 1.4 cm calcified mass like lesion adherent to the lateral LA wall, in the location of the Coumadin ridge (left  lateral ridge). It is non mobile, and likely represents calcified normal variant structure.  7. No intracardiac thrombi or masses were visualized  Patient Profile     83 y.o. female with history of sleep apnea (untreated), obesity, borderline controlled hypertension, asymptomatic coronary artery disease (calcification), chronic diastolic heart failure, left ventricular hypertrophy by echo, bilateral lower extremity DVT (deep), and presentation with left middle cerebral artery embolic CVA treated mechanically with excellent restoration of function.  On 09/03/2018 after being on apixaban for least 24 hours for DVT she developed atrial flutter with RVR and is asymptomatic.  Assessment & Plan    1. Atrial flutter with rapid ventricular response: Atrial fib and or atrial flutter have not been documented as an outpatient.  Atrial flutter developed during this hospitalization.  It developed after anticoagulation with apixaban had been started for lower extremity DVT.  Beta-blocker therapy had been acutely withdrawn, and we were hopeful that she will convert with medical therapy but for the past 24  hours has continued to run a heart rate between 100 -150 bpm with 7 increase in heart rate with activity.  We will start IV diltiazem.  Continue p.o. beta-blocker therapy.  N.p.o. after midnight and if still in atrial flutter will consider cardioversion tomorrow. 2. Embolic CVA: Mechanical thrombectomy performed with excellent neurological recovery 3. Acute bilateral DVT: Coincidentally identified on presentation with acute stroke.  No symptoms to suggest pulmonary embolism.  Possible  paradoxical embolus cannot be excluded.. 4. Essential hypertension: Baseline history of hypertension.  Attempting to allow permissive hypertension because of recent stroke. 5. Acute on chronic diastolic heart failure: Being aggravated by atrial flutter.  Will leave n.p.o. after midnight for cardioversion tomorrow if she does not convert on IV amiodarone. 6. Obstructive sleep apnea, untreated: Therapy will need to be started.  Cardioversion procedure will need to be discussed with the patient and her power of attorney, daughter Monica [name].  I will do this later this afternoon.  For the time being we will start IV amiodarone.  For questions or updates, please contact [place] Please consult [email or web address] for contact info under        Signed, Sinclair Grooms, MD  09/04/2018, 11:43 AM

## 2018-09-04 NOTE — Care Management Important Message (Signed)
Important Message  Patient Details  Name: Monica [name] MRN: [id number] Date of Birth: August 18, 1935   Medicare Important Message Given:  Yes     Valory Wetherby 09/04/2018, 1:35 PM

## 2018-09-04 NOTE — NC FL2 (Signed)
[place] LEVEL OF CARE SCREENING TOOL     IDENTIFICATION  Patient Name: Monica [name] Birthdate: 08-13-1935 Sex: female Admission Date (Current Location): 09/01/2018  [facility] and [place] Number:  Herbalist and Address:  [facility], [place] [address], [place], [place] [address]      Provider Number: [id number]  Attending Physician Name and Address:  Rosalin Hawking, MD  Relative Name and Phone Number:       Current Level of Care: Hospital Recommended Level of Care: [place] Prior Approval Number:    Date Approved/Denied:   PASRR Number: [id number]  Discharge Plan: SNF    Current Diagnoses: Patient Active Problem List   Diagnosis Date Noted  . Acute ischemic left MCA stroke ([place]) 09/01/2018  . Middle cerebral artery embolism, 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  . Degenerative lumbar spinal stenosis 09/20/2017  . Lumbar radiculopathy 07/31/2017  . Lower GI bleed 10/03/2016  . Diverticulosis of colon with hemorrhage   . Status post bilateral knee replacements 05/04/2016  . Chronic cough 04/15/2016  . Seborrheic keratoses 04/15/2016  . Cervical disc disorder with radiculopathy of cervical region 12/01/2015  . Partial nontraumatic rupture of right rotator cuff 09/22/2015  . Compulsive overeating 01/01/2015  . Chronic diastolic heart failure (HCC) 12/12/2012    Class: Chronic  . Urge incontinence of urine 09/09/2011  . Knee osteoarthritis 02/09/2010  . Gout 03/20/2008  . Hyperlipidemia 12/21/2006  . Depression with anxiety 12/21/2006  . Essential hypertension 12/21/2006  . Obstructive sleep apnea 12/21/2006    Orientation RESPIRATION BLADDER Height & Weight     Self, Time, Situation, Place  Normal Continent, Incontinent(incontinent at times) Weight: 250 lb (113.4 kg) Height:  '5\' 7"'  (170.2 cm)  BEHAVIORAL SYMPTOMS/MOOD NEUROLOGICAL BOWEL NUTRITION STATUS      Continent Diet(see DC summary)  AMBULATORY STATUS  COMMUNICATION OF NEEDS Skin   Limited Assist Verbally Normal                       Personal Care Assistance Level of Assistance  Bathing, Feeding, Dressing Bathing Assistance: Limited assistance Feeding assistance: Independent Dressing Assistance: Limited assistance     Functional Limitations Info  Sight, Hearing, Speech Sight Info: Adequate Hearing Info: Adequate Speech Info: Adequate    SPECIAL CARE FACTORS FREQUENCY  PT (By licensed PT), OT (By licensed OT), Speech therapy     PT Frequency: 5x/wk OT Frequency: 5x/wk     Speech Therapy Frequency: 5x/wk      Contractures Contractures Info: Not present    Additional Factors Info  Code Status, Allergies, Insulin Sliding Scale Code Status Info: Full Allergies Info: Buspirone Hcl, Lisinopril, Prozac   Insulin Sliding Scale Info: 0-9 units 3x/day       Current Medications (09/04/2018):  This is the current hospital active medication list Current Facility-Administered Medications  Medication Dose Route Frequency Provider Last Rate Last Dose  . 0.9 %  sodium chloride infusion   Intravenous Continuous Rosalin Hawking, MD   Stopped at 09/02/18 2230  . acetaminophen (TYLENOL) tablet 650 mg  650 mg Oral Q4H PRN Aroor, Lanice Schwab, MD       Or  . acetaminophen (TYLENOL) solution 650 mg  650 mg Per Tube Q4H PRN Aroor, Lanice Schwab, MD   650 mg at 09/03/18 2119   Or  . acetaminophen (TYLENOL) suppository 650 mg  650 mg Rectal Q4H PRN Aroor, Lanice Schwab, MD      . allopurinol (ZYLOPRIM) tablet 200  mg  200 mg Oral Daily Rosalin Hawking, MD   200 mg at 09/04/18 1055  . amiodarone (NEXTERONE) 1.8 mg/mL load via infusion 150 mg  150 mg Intravenous Once Belva Crome, MD       Followed by  . amiodarone (NEXTERONE PREMIX) 360-4.14 MG/200ML-% (1.8 mg/mL) IV infusion  60 mg/hr Intravenous Continuous Belva Crome, MD       Followed by  . amiodarone (NEXTERONE PREMIX) 360-4.14 MG/200ML-% (1.8 mg/mL) IV infusion  30 mg/hr Intravenous  Continuous Belva Crome, MD      . apixaban Arne Cleveland) tablet 10 mg  10 mg Oral BID Kris Mouton, RPH   10 mg at 09/04/18 1055   Followed by  . [START ON 09/10/2018] apixaban (ELIQUIS) tablet 5 mg  5 mg Oral BID Kris Mouton, [facility]      . chlorhexidine gluconate (MEDLINE KIT) (PERIDEX) 0.12 % solution 15 mL  15 mL Mouth Rinse BID Aroor, Karena Addison R, MD   15 mL at 09/04/18 1056  . Chlorhexidine Gluconate Cloth 2 % PADS 6 each  6 each Topical Daily Aroor, Karena Addison R, MD   6 each at 09/04/18 1056  . hydrALAZINE (APRESOLINE) injection 10 mg  10 mg Intravenous Q2H PRN Rosalin Hawking, MD      . insulin aspart (novoLOG) injection 0-9 Units  0-9 Units Subcutaneous TID WC Rosalin Hawking, MD      . metoprolol tartrate (LOPRESSOR) tablet 50 mg  50 mg Oral BID Rosalin Hawking, MD   50 mg at 09/04/18 1055  . pantoprazole (PROTONIX) EC tablet 40 mg  40 mg Oral Daily Rosalin Hawking, MD   40 mg at 09/04/18 1055  . pyridOXINE (VITAMIN B-6) tablet 100 mg  100 mg Oral Daily Rosalin Hawking, MD   100 mg at 09/04/18 1055  . vitamin B-12 (CYANOCOBALAMIN) tablet 1,000 mcg  1,000 mcg Oral Daily Rosalin Hawking, MD   1,000 mcg at 09/04/18 1055     Discharge Medications: Please see discharge summary for a list of discharge medications.  Relevant Imaging Results:  Relevant Lab Results:   Additional Information SS#: [id number]  Geralynn Ochs, LCSW

## 2018-09-04 NOTE — Progress Notes (Addendum)
Physical Therapy Treatment Patient Details Name: Monica [name] MRN: [id number] DOB: November 27, 1935 Today's Date: 09/04/2018    History of Present Illness Monica [name] is a 83 y.o. female with past medical history of hypertension, hyperlipidemia, obesity, remote GI bleeding presents to the emergency room as a code stroke for sudden onset aphasia and right hemiparesis. CT revealed L M1 occlusion.    PT Comments    Patient seen for mobility progression. Pt continues to present with elevated HR with range of 103-150 bpm during session. Pt tolerated total of 140 ft gait distance with standing rest break due to SOB. Pt reports SOB with mobility PTA. Pt will need 24 hour supervision/assistance if discharged home. If 24 hour assist is not available pt may benefit from post acute rehab to maximize independence and safety with mobility prior to d/c home alone.     Follow Up Recommendations  Home health PT;Supervision/Assistance - 24 hour     Equipment Recommendations  None recommended by PT    Recommendations for Other Services       Precautions / Restrictions Precautions Precautions: Fall Precaution Comments: expressive aphasia, watch HR Restrictions Weight Bearing Restrictions: No    Mobility  Bed Mobility Overal bed mobility: Needs Assistance Bed Mobility: Supine to Sit     Supine to sit: Supervision     General bed mobility comments: HOB elevated, increased time, use of bed rail  Transfers Overall transfer level: Needs assistance Equipment used: None Transfers: Sit to/from Stand Sit to Stand: Min guard         General transfer comment: min guard for safety  Ambulation/Gait Ambulation/Gait assistance: Min guard Gait Distance (Feet): (39ft X 2 with standing rest break) Assistive device: Rolling walker (2 wheeled) Gait Pattern/deviations: Step-through pattern;Decreased stride length;Wide base of support     General Gait Details: min guard for safety; cues for rest  break due to SOB; SpO2 91% on RA and HR into 150s bpm   Stairs             Wheelchair Mobility    Modified Rankin (Stroke Patients Only)       Balance Overall balance assessment: Needs assistance Sitting-balance support: Feet supported;No upper extremity supported Sitting balance-Leahy Scale: Good     Standing balance support: No upper extremity supported;During functional activity Standing balance-Leahy Scale: Fair Standing balance comment: pt is able to static stand without UE support                             Cognition Arousal/Alertness: Awake/alert Behavior During Therapy: WFL for tasks assessed/performed Overall Cognitive Status: Within Functional Limits for tasks assessed                           Safety/Judgement: Decreased awareness of safety;Decreased awareness of deficits            Exercises      General Comments General comments (skin integrity, edema, etc.): HR 103-150 during session      Pertinent Vitals/Pain Pain Assessment: No/denies pain    Home Living                      Prior Function            PT Goals (current goals can now be found in the care plan section) Progress towards PT goals: Progressing toward goals    Frequency    Min 4X/week  PT Plan Current plan remains appropriate    Co-evaluation              AM-PAC PT "6 Clicks" Mobility   Outcome Measure  Help needed turning from your back to your side while in a flat bed without using bedrails?: A Little Help needed moving from lying on your back to sitting on the side of a flat bed without using bedrails?: A Little Help needed moving to and from a bed to a chair (including a wheelchair)?: A Little Help needed standing up from a chair using your arms (e.g., wheelchair or bedside chair)?: A Little Help needed to walk in hospital room?: A Little Help needed climbing 3-5 steps with a railing? : A Lot 6 Click Score: 17     End of Session Equipment Utilized During Treatment: Gait belt Activity Tolerance: Patient tolerated treatment well Patient left: in chair;with call bell/phone within reach;with chair alarm set Nurse Communication: Mobility status PT Visit Diagnosis: Unsteadiness on feet (R26.81);Muscle weakness (generalized) (M62.81);Difficulty in walking, not elsewhere classified (R26.2)     Time: 5537-4827 PT Time Calculation (min) (ACUTE ONLY): 35 min  Charges:  $Gait Training: 8-22 mins $Therapeutic Activity: 8-22 mins                     Earney Navy, PTA Acute Rehabilitation Services Pager: [phone] Office: [phone]     Darliss Cheney 09/04/2018, 11:11 AM

## 2018-09-04 NOTE — Progress Notes (Addendum)
STROKE TEAM PROGRESS NOTE   SUBJECTIVE (INTERVAL HISTORY) Pt sitting in chair for lunch. Her speech is much improved. However, her HR still fluctuate from 90-140. She had one pause of 4.3 sec over night. Dr. Tamala Julian is aware and will initiate IV amiodarone. Plan for cardioversion if HR still not able to effectively control.   OBJECTIVE Vitals:   09/04/18 0016 09/04/18 0358 09/04/18 0700 09/04/18 1100  BP: (!) 150/101 (!) 147/78 113/84 119/77  Pulse: (!) 132 62 (!) 145 (!) 50  Resp: 18 18 18 18   Temp: 98.3 F (36.8 C) 98.7 F (37.1 C) 97.8 F (36.6 C) 98.1 F (36.7 C)  TempSrc: Oral Oral Oral Oral  SpO2: 96% 97% 99% 98%  Weight:      Height:        CBC:  Recent Labs  Lab 09/01/18 1915  09/02/18 0506 09/03/18 0551 09/04/18 0458  WBC 7.5  --  7.5 7.5 8.0  NEUTROABS 4.2  --  5.6  --   --   HGB 11.0*   < > 9.1* 9.0* 9.4*  HCT 36.0   < > 29.1* 29.7* 30.6*  MCV 84.1  --  82.0 83.7 82.7  PLT 279  --  247 255 253   < > = values in this interval not displayed.    Basic Metabolic Panel:  Recent Labs  Lab 09/03/18 0551 09/04/18 0458  NA 141 142  K 3.7 3.6  CL 111 111  CO2 22 22  GLUCOSE 95 98  BUN 19 13  CREATININE 0.99 0.96  CALCIUM 8.8* 9.0    Lipid Panel:     Component Value Date/Time   CHOL 153 09/02/2018 0506   TRIG 278 (H) 09/02/2018 0506   TRIG 276 (H) 09/02/2018 0506   HDL 31 (L) 09/02/2018 0506   CHOLHDL 4.9 09/02/2018 0506   VLDL 56 (H) 09/02/2018 0506   LDLCALC 66 09/02/2018 0506   HgbA1c:  Lab Results  Component Value Date   HGBA1C 6.2 (H) 09/02/2018   Urine Drug Screen: No results found for: LABOPIA, COCAINSCRNUR, LABBENZ, AMPHETMU, THCU, LABBARB  Alcohol Level No results found for: [place]  MRI 09/02/2018 1. Multiple small foci early subacute ischemia within the left MCA territory without acute hemorrhage or mass effect. 2. Chronic microhemorrhages of the lower brainstem and left parietal lobe.  Ct Angio Head W Or Wo Contrast  Ct  Angio Neck W Or Wo Contrast 09/01/2018 1. 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  2. Bilateral carotid bifurcation atherosclerosis with less than 50% stenosis by NASCET criteria.  3.  Aortic atherosclerosis (ICD10-I70.0).   Dg Chest Port 1 View 09/01/2018 Endotracheal tube in satisfactory position. No acute infiltrate is noted.   Ct Head Code Stroke Wo Contrast 09/01/2018 1. No acute intracranial abnormality.  2. ASPECTS is 10.  Transthoracic Echocardiogram   1. The left ventricle has hyperdynamic systolic function, with an ejection fraction of >65%. The cavity size was normal. There is mildly increased left ventricular wall thickness. Left ventricular diastolic Doppler parameters are consistent with  pseudonormalization.  2. The right ventricle has normal systolic function. The cavity was normal. There is no increase in right ventricular wall thickness. Right ventricular systolic pressure could not be assessed.  3. Left atrial size was mild-moderately dilated.  4. The mitral valve is abnormal. There is moderate mitral annular calcification present.  5. No stenosis of the aortic valve.  6.  Likely calcified, prominent Coumadin ridge (normal variant landmark between LA appendage and left upper pulmonary vein). There is a 0.8 x 1.4 cm calcified mass like lesion adherent to the lateral LA wall, in the location of the Coumadin ridge (left  lateral ridge). It is non mobile, and likely represents calcified normal variant structure.  7. No intracardiac thrombi or masses were visualized.  EKG - SR rate 74 BPM. (See cardiology reading for complete details)   PHYSICAL EXAM  Temp:  [97.8 F (36.6 C)-98.7 F (37.1 C)] 98.1 F (36.7 C) (07/14 1100) Pulse Rate:  [50-145] 50 (07/14 1100) Resp:  [16-18] 18 (07/14 1100) BP: (113-157)/(77-103) 119/77 (07/14 1100) SpO2:  [96 %-99 %] 98 % (07/14 1100)  General -  Well nourished, well developed, in no apparent distress.  Ophthalmologic - fundi not visualized due to noncooperation.  Cardiovascular - irregularly irregular heart rate and rhythm.  Mental Status -  Awake alert, orientated to place, time and people Following all simple commands, able to name 3/4. Able to repeat sentences. Occasional paraphasic errors  Cranial Nerves II - XII - II - Visual field intact OU. III, IV, VI - Extraocular movements intact. V - Facial sensation intact bilaterally. VII - mild right nasolabial fold flattening. VIII - Hearing & vestibular intact bilaterally. X - Palate elevates symmetrically. XI - Chin turning & shoulder shrug intact bilaterally. XII - Tongue protrusion intact.  Motor Strength - The patient's strength was 4/5 in all extremities and pronator drift was absent.  Bulk was normal and fasciculations were absent.   Motor Tone - Muscle tone was assessed at the neck and appendages and was normal.  Reflexes - The patient's reflexes were symmetrical in all extremities and she had no pathological reflexes.  Sensory - Light touch, temperature/pinprick were assessed and were symmetrical.    Coordination - The patient had normal movements in the hands with no ataxia or dysmetria.  Tremor was absent.  Gait and Station - deferred.   ASSESSMENT/PLAN [name] is a 83 y.o. female with history of hypertension, hyperlipidemia, obesity, remote GI bleeding presenting with sudden onset aphasia and right hemiparesis. tPA given Saturday 09/01/18 @ 1945  Stroke:  Left  MCA punctate small infarct due to left M1 occlusion s/p tPA and IR with TICI3 reperfusion - embolic - in setting of bilateral DVT versus AND new onset A. fib  CT head - No acute intracranial abnormality.   MRI head - small punctate left MCA infarcts  CTA H&N - occlusion of the distal M1 with intermediate collateralization.  Bilateral ICA bulb atherosclerosis.  Right VA origin  stenosis  2D Echo - EF > 65%  LE venous Doppler showed right PTV, left popliteal, and b/l peroneal vein acute DVT  Lacey Jensen Virus 2  - negative  LDL - 66  HgbA1c - 6.2  VTE prophylaxis - SCDs  No antithrombotic prior to admission, now on On apixaban 10 x 7 days followed 5 mg bid given DVT  Therapy recommendations:  HHPT, HH OT, HH SLP  Disposition:  Pending  Atrial Fibrillation/Atrial Flutter w/ RVR, new onset in hospital . EKG - Sinus tachycardia, Marked ST abnormality, possible lateral subendocardial injury . Symptomatic . On metoprolol 50mg  bid - with one episode of 4.3 sec pause overnight . Seen by Dr. Tamala Julian as OP, consulted IP . On eliquis  . Initiate amiodarone drip today . For cardioversion today if HR still cannot be effectively controlled . Continue Eliquis (apixaban) daily at discharge  Bilateral DVT  LE venous Doppler showed right PTV, left popliteal, and b/l peroneal vein acute DVT  On apixaban 10 mg bid x 7 days followed 5 mg bid   Hypertension  Stable . Permissive hypertension (OK if < 180/105) but gradually normalize in 3-5 days . Long-term BP goal normotensive  Other Stroke Risk Factors  Advanced age  Former cigarette smoker - quit  ETOH use, advised to drink no more than 1 alcoholic beverage per day.  Obesity, Body mass index is 39.16 kg/m., recommend weight loss, diet and exercise as appropriate   Family hx stroke (father)  Obstructive sleep apnea  Other Active Problems  Acute blood loss anemia - Hb -11.9->9.9->9.1->9.0->9.4  Remote history of GI bleeding 3 years ago  Hospital day # 3  I had long discussion with daughter over the phone, updated pt current condition, treatment plan and potential prognosis. They expressed understanding and appreciation.    Rosalin Hawking, MD PhD Stroke Neurology 09/04/2018 1:56 PM   To contact Stroke Continuity provider, please refer to [email or web address] After hours, contact General Neurology

## 2018-09-04 NOTE — Progress Notes (Signed)
MD returned call to RN. Delia Heady RN

## 2018-09-04 NOTE — TOC Initial Note (Signed)
Transition of Care [facility]) - Initial/Assessment Note    Patient Details  Name: Monica [name] MRN: [id number] Date of Birth: 11-20-1935  [facility] of [facility] CM/SW Contact:    Geralynn Ochs, LCSW Phone Number: 09/04/2018, 12:14 PM  Clinical Narrative:   CSW met with patient to discuss discharge plans. CSW explained role and recommendation for home health with 24/7 assistance. Patient indicated that she didn't feel comfortable going home, and that she didn't have anyone that would be able to assist. CSW asked about patient's daughter, and patient indicated that her daughter has her own stuff going on so she doesn't want to ask. Plus, patient indicated that her daughter "would drive her crazy". Patient also discussed how she has stairs and doesn't feel comfortable being able to navigate them without feeling stronger first. Patient said she's been to SNF for short stays in the past, hopeful that she might qualify to return to [facility] for a short period of time so that she can feel more confident and comfortable returning home on her own. CSW received permission to work on paperwork and send out referral. CSW asked PT about patient's appropriateness for a SNF recommendation with the knowledge that she would be home alone and without any assistance.                 Expected Discharge Plan: Skilled Nursing Facility Barriers to Discharge: Continued Medical Work up, Ship broker   Patient Goals and CMS Choice Patient states their goals for this hospitalization and ongoing recovery are:: to feel stronger and safer going home, to not have to have her daughter stay with her for help CMS Medicare.gov Compare Post Acute Care list provided to:: Patient Choice offered to / list presented to : Patient  Expected Discharge Plan and Services Expected Discharge Plan: [place] Choice: [place] Living arrangements for the past 2 months:  Single Family Home                                      Prior Living Arrangements/Services Living arrangements for the past 2 months: Single Family Home Lives with:: Self Patient language and need for interpreter reviewed:: No Do you feel safe going back to the place where you live?: Yes      Need for Family Participation in Patient Care: No (Comment) Care giver support system in place?: No (comment) Current home services: DME Criminal Activity/Legal Involvement Pertinent to Current Situation/Hospitalization: No - Comment as needed  Activities of Daily Living      Permission Sought/Granted Permission sought to share information with : Chartered certified accountant granted to share information with : Yes, Verbal Permission Granted     Permission granted to share info w AGENCY: [facility]        Emotional Assessment Appearance:: Appears stated age Attitude/Demeanor/Rapport: Engaged Affect (typically observed): Pleasant Orientation: : Oriented to Self, Oriented to Place, Oriented to  Time, Oriented to Situation Alcohol / Substance Use: Not Applicable Psych Involvement: No (comment)  Admission diagnosis:  Stroke [facility]) [I63.9] Acute ischemic stroke [facility]) [I63.9] Cerebrovascular accident (CVA), unspecified mechanism ([place]) [I63.9] Acute ischemic left MCA stroke [facility]) [I63.512] Patient Active Problem List   Diagnosis Date Noted  . Acute ischemic left MCA stroke ([place]) 09/01/2018  . Middle cerebral artery embolism, 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  . Degenerative lumbar spinal stenosis 09/20/2017  . Lumbar radiculopathy  07/31/2017  . Lower GI bleed 10/03/2016  . Diverticulosis of colon with hemorrhage   . Status post bilateral knee replacements 05/04/2016  . Chronic cough 04/15/2016  . Seborrheic keratoses 04/15/2016  . Cervical disc disorder with radiculopathy of cervical region 12/01/2015  . Partial nontraumatic rupture of right rotator cuff 09/22/2015  . Compulsive  overeating 01/01/2015  . Chronic diastolic heart failure (HCC) 12/12/2012    Class: Chronic  . Urge incontinence of urine 09/09/2011  . Knee osteoarthritis 02/09/2010  . Gout 03/20/2008  . Hyperlipidemia 12/21/2006  . Depression with anxiety 12/21/2006  . Essential hypertension 12/21/2006  . Obstructive sleep apnea 12/21/2006   PCP:  Seward Carol, MD Pharmacy:   [facility], [place], [place] [address], [place] [address] Phone: [phone] Fax: [phone]  [id number] [place]Phone: [phone]: [phone]    Social Determinants of Health (SDOH) Interventions    Readmission Risk Interventions No flowsheet data found.

## 2018-09-04 NOTE — Plan of Care (Signed)
Progressing towards goals

## 2018-09-05 DIAGNOSIS — Z7901 Long term (current) use of anticoagulants: Secondary | ICD-10-CM

## 2018-09-05 DIAGNOSIS — I480 Paroxysmal atrial fibrillation: Secondary | ICD-10-CM

## 2018-09-05 LAB — CBC
HCT: 29.5 % — ABNORMAL LOW (ref 36.0–46.0)
Hemoglobin: 9 g/dL — ABNORMAL LOW (ref 12.0–15.0)
MCH: 25.5 pg — ABNORMAL LOW (ref 26.0–34.0)
MCHC: 30.5 g/dL (ref 30.0–36.0)
MCV: 83.6 fL (ref 80.0–100.0)
Platelets: 253 10*3/uL (ref 150–400)
RBC: 3.53 MIL/uL — ABNORMAL LOW (ref 3.87–5.11)
RDW: 18.3 % — ABNORMAL HIGH (ref 11.5–15.5)
WBC: 8.6 10*3/uL (ref 4.0–10.5)
nRBC: 0 % (ref 0.0–0.2)

## 2018-09-05 LAB — BASIC METABOLIC PANEL
Anion gap: 9 (ref 5–15)
BUN: 19 mg/dL (ref 8–23)
CO2: 23 mmol/L (ref 22–32)
Calcium: 9 mg/dL (ref 8.9–10.3)
Chloride: 107 mmol/L (ref 98–111)
Creatinine, Ser: 1.28 mg/dL — ABNORMAL HIGH (ref 0.44–1.00)
GFR calc Af Amer: 45 mL/min — ABNORMAL LOW (ref >60)
GFR calc non Af Amer: 39 mL/min — ABNORMAL LOW (ref >60)
Glucose, Bld: 104 mg/dL — ABNORMAL HIGH (ref 70–99)
Potassium: 3.6 mmol/L (ref 3.5–5.1)
Sodium: 139 mmol/L (ref 135–145)

## 2018-09-05 LAB — GLUCOSE, CAPILLARY
Glucose-Capillary: 103 mg/dL — ABNORMAL HIGH (ref 70–99)
Glucose-Capillary: 140 mg/dL — ABNORMAL HIGH (ref 70–99)

## 2018-09-05 SURGERY — CARDIOVERSION
Anesthesia: General

## 2018-09-05 MED ORDER — APIXABAN 5 MG PO TABS: 5.0000 mg | ORAL_TABLET | Freq: Two times a day (BID) | ORAL | 0 refills | Status: DC

## 2018-09-05 MED ORDER — APIXABAN 5 MG PO TABS: 10.0000 mg | ORAL_TABLET | Freq: Two times a day (BID) | ORAL | 0 refills | Status: DC

## 2018-09-05 MED ORDER — AMIODARONE HCL 200 MG PO TABS
200.0000 mg | ORAL_TABLET | Freq: Two times a day (BID) | ORAL | Status: DC
  Administered 2018-09-05: 200 mg via ORAL
  Filled 2018-09-05: qty 1

## 2018-09-05 MED ORDER — ALLOPURINOL 100 MG PO TABS: ORAL_TABLET | ORAL | 0 refills | Status: DC

## 2018-09-05 MED ORDER — VITAMIN B-6 100 MG PO TABS: 100.0000 mg | ORAL_TABLET | Freq: Every day | ORAL | Status: DC

## 2018-09-05 MED ORDER — AMIODARONE HCL 200 MG PO TABS: 200.0000 mg | ORAL_TABLET | Freq: Two times a day (BID) | ORAL | 2 refills | Status: DC

## 2018-09-05 MED ORDER — AMIODARONE HCL 200 MG PO TABS: 200.0000 mg | ORAL_TABLET | Freq: Every day | ORAL | Status: DC

## 2018-09-05 MED ORDER — METOPROLOL SUCCINATE ER 100 MG PO TB24
100.0000 mg | ORAL_TABLET | Freq: Every day | ORAL | Status: DC
  Administered 2018-09-05: 100 mg via ORAL
  Filled 2018-09-05: qty 1

## 2018-09-05 NOTE — Plan of Care (Signed)
Adequate for discharge.

## 2018-09-05 NOTE — Progress Notes (Signed)
Progress Note  Patient Name: Monica [name] Date of Encounter: 09/05/2018  Primary Cardiologist: Sinclair Grooms, MD   Subjective   Converted over night. Feels no different after conversion.  Inpatient Medications    Scheduled Meds: . allopurinol  200 mg Oral Daily  . amiodarone  200 mg Oral BID  . [START ON 09/20/2018] amiodarone  200 mg Oral Daily  . apixaban  10 mg Oral BID   Followed by  . [START ON 09/10/2018] apixaban  5 mg Oral BID  . chlorhexidine gluconate (MEDLINE KIT)  15 mL Mouth Rinse BID  . Chlorhexidine Gluconate Cloth  6 each Topical Daily  . insulin aspart  0-9 Units Subcutaneous TID WC  . metoprolol succinate  100 mg Oral Daily  . pantoprazole  40 mg Oral Daily  . pyridOXINE  100 mg Oral Daily  . cyanocobalamin  1,000 mcg Oral Daily   Continuous Infusions: . sodium chloride Stopped (09/02/18 2230)   PRN Meds: acetaminophen **OR** acetaminophen (TYLENOL) oral liquid 160 mg/5 mL **OR** acetaminophen, hydrALAZINE   Vital Signs    Vitals:   09/04/18 1551 09/04/18 1936 09/04/18 2341 09/05/18 0326  BP: 137/85 137/86 100/64 107/65  Pulse: 91 79 74 79  Resp:  '18 18 18  ' Temp: 97.9 F (36.6 C) 98.5 F (36.9 C) 98.3 F (36.8 C) 98 F (36.7 C)  TempSrc: Oral Oral Oral Oral  SpO2:  99% 98% 99%  Weight:      Height:        Intake/Output Summary (Last 24 hours) at 09/05/2018 0750 Last data filed at 09/05/2018 0327 Gross per 24 hour  Intake 540 ml  Output 700 ml  Net -160 ml   Filed Weights   09/01/18 1900  Weight: 113.4 kg    Telemetry    NSR restored from A Flutter during the night. Frequent PAC's. - Personally Reviewed  ECG    Pending today - Personally Reviewed  Physical Exam  Obese GEN: No acute distress.   Neck: No JVD Cardiac: RRR, no murmurs, rubs, or gallops.  Respiratory: Clear to auscultation bilaterally. GI: Soft, nontender, non-distended  MS: No edema; No deformity. Neuro:  Nonfocal  Psych: Normal affect   Labs     Chemistry Recent Labs  Lab 09/01/18 1915  09/03/18 0551 09/04/18 0458 09/05/18 0430  NA 141   < > 141 142 139  K 4.2   < > 3.7 3.6 3.6  CL 105   < > 111 111 107  CO2 26   < > '22 22 23  ' GLUCOSE 106*   < > 95 98 104*  BUN 27*   < > '19 13 19  ' CREATININE 1.17*   < > 0.99 0.96 1.28*  CALCIUM 9.3   < > 8.8* 9.0 9.0  PROT 6.8  --   --   --   --   ALBUMIN 3.6  --   --   --   --   AST 22  --   --   --   --   ALT 16  --   --   --   --   ALKPHOS 99  --   --   --   --   BILITOT 0.5  --   --   --   --   GFRNONAA 44*   < > 53* 55* 39*  GFRAA 51*   < > >60 >60 45*  ANIONGAP 10   < > '8 9 9   ' < > =  values in this interval not displayed.     Hematology Recent Labs  Lab 09/03/18 0551 09/04/18 0458 09/05/18 0430  WBC 7.5 8.0 8.6  RBC 3.55* 3.70* 3.53*  HGB 9.0* 9.4* 9.0*  HCT 29.7* 30.6* 29.5*  MCV 83.7 82.7 83.6  MCH 25.4* 25.4* 25.5*  MCHC 30.3 30.7 30.5  RDW 17.8* 18.1* 18.3*  PLT 255 253 253    Cardiac EnzymesNo results for input(s): TROPONINI in the last 168 hours. No results for input(s): TROPIPOC in the last 168 hours.   BNPNo results for input(s): BNP, PROBNP in the last 168 hours.   DDimer No results for input(s): DDIMER in the last 168 hours.   Radiology    No results found.  Cardiac Studies   No new.  Patient Profile     83 y.o. female  with history of sleep apnea (untreated), obesity, borderline controlled hypertension, asymptomatic coronary artery disease (calcification), chronic diastolic heart failure, left ventricular hypertrophy by echo, bilateral lower extremity DVT (deep), and presentation with left middle cerebral artery embolic CVA treated mechanically with excellent restoration of function.  On 09/03/2018 after being on apixaban for least 24 hours for DVT she developed atrial flutter with RVR and is asymptomatic.  Assessment & Plan    1. Atrial flutter: fortunately converted on IV amiodarone over night. Plan convert to oral amiodarone 20 mg BID for 14  days then decrease to 200 mg daily. Duration of therapy will be 6-12 weeks then DC and 30 day monitor thereafter. 2. Bilateral lower extremity DVT: on anticoagulation for both DVT and AFL. 3. Embolic CVA: clinically improved 4. Essential hypertension: < 130/80 mmHg 5. Anticoagulation: Apixaban to be decreased to 5 mg BID after 2 weeks. 6. Amiodarone therapy: 200 mg BID for two weeks then 200 mg daily. 7. Diastolic heart failure, chronic: stable without volume overload.  Okay for DC from CV standpoint    For questions or updates, please contact [place] Please consult [email or web address] for contact info under Cardiology/STEMI.      Signed, Sinclair Grooms, MD  09/05/2018, 7:50 AM

## 2018-09-05 NOTE — Progress Notes (Signed)
Occupational Therapy Treatment Patient Details Name: Monica [name] MRN: [id number] DOB: 08-17-1935 Today's Date: 09/05/2018    History of present illness Monica [name] is a 83 y.o. female with past medical history of hypertension, hyperlipidemia, obesity, remote GI bleeding presents to the emergency room as a code stroke for sudden onset aphasia and right hemiparesis. CT revealed L M1 occlusion.   OT comments  Pt reports feeling very fatigued this morning but was able to sleep last night. No c/o pain. Pt required assistance to use menu and phone to order breakfast tray. Supine >sit with supervision and use of bed rail. Pt reports she is unable to don B socks but has sock aid at home she can use. Pt standing from bed and ambulating few steps to recliner chair without use of AD and min guard. Once seated, pt performed grooming tasks with set up. Pt declined further OT intervention secondary to fatigue and remained in chair with chair alarm activated and all needs within reach. Pt will continue to benefit from OT intervention. HR remained 110-120 bpm with mobility and decreased to 80 - 100 bpm with rest.   Follow Up Recommendations  Home health OT;Supervision/Assistance - 24 hour    Equipment Recommendations  None recommended by OT       Precautions / Restrictions Precautions Precautions: Fall Precaution Comments: expressive aphasia, watch HR Restrictions Weight Bearing Restrictions: No       Mobility Bed Mobility Overal bed mobility: Needs Assistance Bed Mobility: Supine to Sit     Supine to sit: Supervision     General bed mobility comments: HOB elevated, increased time, use of bed rail  Transfers Overall transfer level: Needs assistance Equipment used: None Transfers: Sit to/from Stand Sit to Stand: Min guard         General transfer comment: min guard for safety    Balance Overall balance assessment: Needs assistance Sitting-balance support: Feet supported;No  upper extremity supported Sitting balance-Leahy Scale: Good Sitting balance - Comments: pt however unable to don socks due to body habitus   Standing balance support: No upper extremity supported;During functional activity Standing balance-Leahy Scale: Fair Standing balance comment: pt is able to static stand without UE support         ADL either performed or assessed with clinical judgement   ADL Overall ADL's : Needs assistance/impaired     Grooming: Wash/dry hands;Wash/dry face;Oral care;Sitting;Set up          Vision Baseline Vision/History: Wears glasses Wears Glasses: At all times Patient Visual Report: No change from baseline            Cognition Arousal/Alertness: Awake/alert Behavior During Therapy: WFL for tasks assessed/performed Overall Cognitive Status: Within Functional Limits for tasks assessed Area of Impairment: Problem solving;Safety/judgement      Safety/Judgement: Decreased awareness of safety;Decreased awareness of deficits   Problem Solving: Slow processing;Decreased initiation;Difficulty sequencing;Requires verbal cues;Requires tactile cues                     Pertinent Vitals/ Pain       Pain Assessment: No/denies pain         Frequency  Min 2X/week        Progress Toward Goals  OT Goals(current goals can now be found in the care plan section)     Acute Rehab OT Goals Patient Stated Goal: to go home OT Goal Formulation: With patient Time For Goal Achievement: 09/17/18 Potential to Achieve Goals: Good  Plan Discharge plan remains appropriate  AM-PAC OT "6 Clicks" Daily Activity     Outcome Measure   Help from another person eating meals?: A Little Help from another person taking care of personal grooming?: A Little Help from another person toileting, which includes using toliet, bedpan, or urinal?: A Little Help from another person bathing (including washing, rinsing, drying)?: A Little Help from another person  to put on and taking off regular upper body clothing?: A Little Help from another person to put on and taking off regular lower body clothing?: A Little 6 Click Score: 18    End of Session    OT Visit Diagnosis: Muscle weakness (generalized) (M62.81);Hemiplegia and hemiparesis Hemiplegia - Right/Left: Right Hemiplegia - dominant/non-dominant: Dominant Hemiplegia - caused by: Unspecified   Activity Tolerance Patient limited by fatigue   Patient Left in chair;with call bell/phone within reach;with chair alarm set   Nurse Communication Mobility status        Time: 8676-1950 OT Time Calculation (min): 19 min  Charges: OT General Charges $OT Visit: 1 Visit OT Treatments $Self Care/Home Management : 8-22 mins   Niranjan Rufener P, MS, OTR/L 09/05/2018, 12:45 PM

## 2018-09-05 NOTE — Discharge Summary (Addendum)
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 Fib  Active problems:  Bilateral DVT  New onset A. fib/a flutter treated with amiodarone IV and now change to p.o.  Hypertension  Anemia of chronic disease  History of GI bleeding  Past Medical History:  Diagnosis Date  . Allergic rhinitis   . Anxiety   . Benign neoplasm of breast 2013  . Cardiomegaly   . Compulsive overeating   . Depression   . Diffuse cystic mastopathy 2013  . Edema   . GI bleeding   . Gout   . HTN (hypertension)   . Hyperlipidemia   . Lump or mass in breast 2012  . OA (osteoarthritis) of knee    with injections  . Obesity   . Other abnormal glucose   . Personal history of tobacco use, presenting hazards to health   . Sciatica   . Sinus infection 2010  . Unspecified sleep apnea    Past Surgical History:  Procedure Laterality Date  . adenosine cardiolite  1/08   low risk   . APPENDECTOMY    . BREAST LUMPECTOMY     right x1 ('89) left x2 ('70s, '90)  . CARDIAC CATHETERIZATION  2001   normal per pt  . COLONOSCOPY  11/02   diverticulosis  . COLONOSCOPY  9/06   diverticulosis, hemorhoids  . COLONOSCOPY N/A 10/01/2016   Procedure: COLONOSCOPY;  Surgeon: Gatha Mayer, MD;  Location: [facility];  Service: Endoscopy;  Laterality: N/A;  . dexa  3/02   normal  . EYE SURGERY  2012   cataract  . IR CT HEAD LTD  09/01/2018  . IR PERCUTANEOUS ART THROMBECTOMY/INFUSION INTRACRANIAL INC DIAG ANGIO   09/01/2018  . knee replacement Right 9/11   R. Dr. Telford Nab  . RADIOLOGY WITH ANESTHESIA N/A 09/01/2018   Procedure: IR WITH ANESTHESIA;  Surgeon: Luanne Bras, MD;  Location: [place];  Service: Radiology;  Laterality: N/A;  . sleep study  5/05  . TONSILLECTOMY    . TOTAL ABDOMINAL HYSTERECTOMY     fibroids, age of 70  . TOTAL KNEE ARTHROPLASTY Left 2012  . US TRANSVAGINAL PELVIC MODIFIED  2000, 2001    Allergies as of 09/05/2018      Reactions   Buspirone Hcl Other (See Comments)   REACTION: made her sleepy, ? swollen legs   Lisinopril Other (See Comments)   REACTION: dizziness   Prozac [fluoxetine Hcl] Other (See Comments)   sleepy      Medication List    STOP taking these medications   amLODipine 5 MG tablet Commonly known as: NORVASC   furosemide 40 MG tablet Commonly known as: LASIX   meloxicam 15 MG tablet Commonly known as: MOBIC   potassium chloride SA 20 MEQ tablet Commonly known as: K-DUR   Vitamin D (Ergocalciferol) 1.25 MG (50000 UT) Caps capsule Commonly known as: DRISDOL     TAKE these medications   allopurinol 100 MG tablet Commonly  known as: ZYLOPRIM TAKE 2 TABLETS BY MOUTH ONCE A DAY *NEED OFFICE VISIT What changed: See the new instructions.   amiodarone 200 MG tablet Commonly known as: PACERONE Take 1 tablet (200 mg total) by mouth 2 (two) times daily. Starting 09/05/2018 take 200 mg twice a day for 14 days then decrease to 200 mg once a day   apixaban 5 MG Tabs tablet Commonly known as: ELIQUIS Take 2 tablets (10 mg total) by mouth 2 (two) times daily for 8 doses.   apixaban 5 MG Tabs tablet Commonly known as: ELIQUIS Take 1 tablet (5 mg total) by mouth 2 (two) times daily. Start with evening dose 7/19 Start taking on: September 09, 2018   colchicine 0.6 MG tablet Take 0.6 mg by mouth daily as needed (gout attacks).   docusate sodium 100 MG capsule Commonly known as: COLACE Take 100 mg by mouth 2 (two) times daily as needed for mild  constipation.   DULoxetine 30 MG capsule Commonly known as: CYMBALTA Take 30 mg by mouth daily.   Magnesium 250 MG Tabs Take 250 mg by mouth daily.   metoprolol succinate 100 MG 24 hr tablet Commonly known as: TOPROL-XL TAKE 1 TABLET BY MOUTH DAILY WITH OR IMMEDIATLY FOLLOWING A MEAL What changed: See the new instructions.   pantoprazole 40 MG tablet Commonly known as: PROTONIX Take 40 mg by mouth daily before breakfast.   pyridOXINE 100 MG tablet Commonly known as: VITAMIN B-6 Take 1 tablet (100 mg total) by mouth daily.       LABORATORY STUDIES CBC    Component Value Date/Time   WBC 8.6 09/05/2018 0430   RBC 3.53 (L) 09/05/2018 0430   HGB 9.0 (L) 09/05/2018 0430   HCT 29.5 (L) 09/05/2018 0430   PLT 253 09/05/2018 0430   MCV 83.6 09/05/2018 0430   MCH 25.5 (L) 09/05/2018 0430   MCHC 30.5 09/05/2018 0430   RDW 18.3 (H) 09/05/2018 0430   LYMPHSABS 1.3 09/02/2018 0506   MONOABS 0.5 09/02/2018 0506   EOSABS 0.0 09/02/2018 0506   BASOSABS 0.0 09/02/2018 0506   CMP    Component Value Date/Time   NA 139 09/05/2018 0430   K 3.6 09/05/2018 0430   CL 107 09/05/2018 0430   CO2 23 09/05/2018 0430   GLUCOSE 104 (H) 09/05/2018 0430   BUN 19 09/05/2018 0430   CREATININE 1.28 (H) 09/05/2018 0430   CALCIUM 9.0 09/05/2018 0430   PROT 6.8 09/01/2018 1915   ALBUMIN 3.6 09/01/2018 1915   AST 22 09/01/2018 1915   ALT 16 09/01/2018 1915   ALKPHOS 99 09/01/2018 1915   BILITOT 0.5 09/01/2018 1915   GFRNONAA 39 (L) 09/05/2018 0430   GFRAA 45 (L) 09/05/2018 0430   COAGS Lab Results  Component Value Date   INR 1.0 09/01/2018   INR 0.90 09/30/2016   INR 1.02 04/23/2010   Lipid Panel    Component Value Date/Time   CHOL 153 09/02/2018 0506   TRIG 278 (H) 09/02/2018 0506   TRIG 276 (H) 09/02/2018 0506   HDL 31 (L) 09/02/2018 0506   CHOLHDL 4.9 09/02/2018 0506   VLDL 56 (H) 09/02/2018 0506   LDLCALC 66 09/02/2018 0506   HgbA1C  Lab Results  Component Value Date    HGBA1C 6.2 (H) 09/02/2018   Urinalysis    Component Value Date/Time   COLORURINE AMBER BIOCHEMICALS MAY BE AFFECTED BY COLOR (A) 04/23/2010 1210   APPEARANCEUR CLOUDY (A) 04/23/2010 1210   LABSPEC 1.027 04/23/2010 1210  PHURINE 6.5 04/23/2010 1210   GLUCOSEU NEGATIVE 04/23/2010 1210   HGBUR NEGATIVE 04/23/2010 1210   BILIRUBINUR NEGATIVE 04/23/2010 1210   KETONESUR TRACE (A) 04/23/2010 1210   PROTEINUR NEGATIVE 04/23/2010 1210   UROBILINOGEN 0.2 04/23/2010 1210   NITRITE NEGATIVE 04/23/2010 1210   LEUKOCYTESUR SMALL (A) 04/23/2010 1210   Urine Drug Screen No results found for: LABOPIA, COCAINSCRNUR, LABBENZ, AMPHETMU, THCU, LABBARB  Alcohol Level No results found for: [facility]   SIGNIFICANT DIAGNOSTIC STUDIES MRI 09/02/2018 1. Multiple small foci early subacute ischemia within the left MCA territory without acute hemorrhage or mass effect. 2. Chronic microhemorrhages of the lower brainstem and left parietal lobe.  Ct Angio Head W Or Wo Contrast  Ct Angio Neck W Or Wo Contrast 09/01/2018 1. 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  2. Bilateral carotid bifurcation atherosclerosis with less than 50% stenosis by NASCET criteria.  3.  Aortic atherosclerosis (ICD10-I70.0).   Ct Head Code Stroke Wo Contrast 09/01/2018 1. No acute intracranial abnormality.  2. ASPECTS is 10.  Transthoracic Echocardiogram  1. The left ventricle has hyperdynamic systolic function, with an ejection fraction of >65%. The cavity size was normal. There is mildly increased left ventricular wall thickness. Left ventricular diastolic Doppler parameters are consistent with  pseudonormalization. 2. The right ventricle has normal systolic function. The cavity was normal. There is no increase in right ventricular wall thickness. Right ventricular systolic pressure could not be assessed. 3. Left atrial size was  mild-moderately dilated. 4. The mitral valve is abnormal. There is moderate mitral annular calcification present. 5. No stenosis of the aortic valve. 6. Likely calcified, prominent Coumadin ridge (normal variant landmark between LA appendage and left upper pulmonary vein). There is a 0.8 x 1.4 cm calcified mass like lesion adherent to the lateral LA wall, in the location of the Coumadin ridge (left  lateral ridge). It is non mobile, and likely represents calcified normal variant structure. 7. No intracardiac thrombi or masses were visualized.  Dg Chest Port 1 View 09/01/2018 Endotracheal tube in satisfactory position. No acute infiltrate is noted.   EKG - SR rate 74 BPM. (See cardiology reading for complete details)    HISTORY OF PRESENT ILLNESS Monica [name] is a 83 y.o. female with past medical history of hypertension, hyperlipidemia, obesity, remote GI bleeding presents to the emergency room as a code stroke for sudden onset aphasia and right hemiparesis.  Last seen normal 6:15 PM on 09/01/2018, patient was having her tree trimmed when she suddenly developed difficulty speaking.  Patient's daughter was called who lives in the same neighborhood and saw her mother was not speaking and not moving her right side EMS was called and patient was brought in as a code stroke.  Her exam had improved in route and when she arrived she no longer had the right-sided weakness but remained aphasic with right facial droop.  NIH stroke scale was 8 on assessment.  CT head showed no hemorrhage and patient received IV TPA.  CT angiogram was performed which showed a left M1 occlusion.  Risk versus benefit was discussed with the daughter and we proceeded to IR for mechanical thrombectomy.  Patient lives independently at home and is very active.  She had remote history of GI bleed about 3 years ago. Baseline MRS 0   HOSPITAL COURSE [name] is a 83 y.o. female with history of hypertension,  hyperlipidemia, obesity, remote GI bleeding presenting with sudden  onset aphasia and right hemiparesis. tPA given Saturday 09/01/18 @ 1945 and taken to IR for mechanical thrombectomy.  Stroke:  Left  MCA punctate small infarct due to left M1 occlusion s/p tPA and IR with TICI3 reperfusion - embolic - in setting of bilateral DVT AND new onset A. fib  CT head - No acute intracranial abnormality.   MRI head - small punctate left MCA infarcts  CTA H&N - occlusion of the distal M1 with intermediate collateralization.  Bilateral ICA bulb atherosclerosis.  Right VA origin stenosis  2D Echo - EF > 65%  LE venous Doppler showed right PTV, left popliteal, and b/l peroneal vein acute DVT  Lacey Jensen Virus 2  - negative  LDL - 66  HgbA1c - 6.2  No antithrombotic prior to admission, now on On apixaban 10 mg x 7 days followed 5 mg bid given DVT  Therapy recommendations:  HHPT, HH OT, HH SLP  Disposition:  return home  Atrial Fibrillation/Atrial Flutter w/ RVR, new onset in hospital  EKG - Sinus tachycardia, Marked ST abnormality, possible lateral subendocardial injury  Symptomatic  On metoprolol 50mg  bid - with one episode of 4.3 sec pause overnight  Seen by Dr. Tamala Julian as OP, consulted IP  Treated with amiodarone drip in hospital and converted to NSR  Change to po amiodarone and with 200 mg bid x 14 days then 200 mg daily. Plan to treat x 6-12 weeks then d/c and do 30 day monitor at that time  Continue Eliquis (apixaban) daily at discharge (see below)  Follow-up with Dr. Tamala Julian as outpatient   Bilateral DVT  LE venous Doppler showed right PTV, left popliteal, and b/l peroneal vein acute DVT  On apixaban 10 mg bid x 7 days followed by 5 mg bid   Hypertension  Stable  BP goal normotensive  Other Stroke Risk Factors  Advanced age  Former cigarette smoker - quit  ETOH use, advised to drink no more than 1 alcoholic beverage per day.  Obesity, Body mass index is 39.16  kg/m., recommend weight loss, diet and exercise as appropriate   Family hx stroke (father)  Obstructive sleep apnea  Other Active Problems  Acute blood loss anemia - Hb -11.9->9.9->9.1->9.0->9.4->9.0  Remote history of GI bleeding 3 years ago   DISCHARGE EXAM Blood pressure 108/79, pulse 80, temperature 98.3 F (36.8 C), temperature source Axillary, resp. rate 20, height 5\' 7"  (1.702 m), weight 113.4 kg, SpO2 98 %. General - Well nourished, well developed, in no apparent distress.  Ophthalmologic - fundi not visualized due to noncooperation.  Cardiovascular - irregularly irregular heart rate and rhythm.  Mental Status -  Awake alert, orientated to place, time and people Following all simple commands, able to name 3/4. Able to repeat sentences. Occasional paraphasic errors  Cranial Nerves II - XII - II - Visual field intact OU. III, IV, VI - Extraocular movements intact. V - Facial sensation intact bilaterally. VII - facial symmetrical. VIII - Hearing & vestibular intact bilaterally. X - Palate elevates symmetrically. XI - Chin turning & shoulder shrug intact bilaterally. XII - Tongue protrusion intact.  Motor Strength - The patient's strength was 4/5 in all extremities and pronator drift was absent.  Bulk was normal and fasciculations were absent.   Motor Tone - Muscle tone was assessed at the neck and appendages and was normal.  Reflexes - The patient's reflexes were symmetrical in all extremities and she had no pathological reflexes.  Sensory - Light touch, temperature/pinprick were assessed  and were symmetrical.    Coordination - The patient had normal movements in the hands with no ataxia or dysmetria.  Tremor was absent.  Gait and Station - deferred.  Discharge Diet   Carb modified thin liquids  DISCHARGE PLAN  Disposition:  Return home  Eliquis (apixaban) daily 10 mg bid x 7 days for DVT treatment then decrease to 5 mg bid for secondary stroke  prevention  Amiodarone 200 mg bid x 14 days then 200 mg daily. Plan to treat x 6-12 weeks then d/c and do 30 day monitor at that time  Ongoing stroke risk factor control by Primary Care Physician at time of discharge  Follow-up Seward Carol, MD in 2 weeks.  Follow-up in [facility] in 4 weeks, office to schedule an appointment.   Follow up Dr. Tamala Julian . Call for an appt  45 minutes were spent preparing discharge.  Rosalin Hawking, MD PhD Stroke Neurology 09/05/2018 6:49 PM

## 2018-09-05 NOTE — TOC Transition Note (Signed)
Transition of Care [facility]) - CM/SW Discharge Note   Patient Details  Name: Monica [name] MRN: [id number] Date of Birth: [birth year]  Transition of [facility] CM/SW Contact:  Geralynn Ochs, LCSW Phone Number: 09/05/2018, 2:34 PM   Clinical Narrative:   Patient has still not received insurance authorization to admit to SNF. CSW met with patient to discuss discharge, patient has changed her mind to discharge home with her daughter today. Patient has requested home health services. Patient set up with [facility] for home health services. No equipment needed. Daughter will pick up the patient when able.     Final next level of care: [place] Barriers to Discharge: Barriers Resolved   Patient Goals and CMS Choice Patient states their goals for this hospitalization and ongoing recovery are:: to feel stronger and safer going home, to not have to have her daughter stay with her for help CMS Medicare.gov Compare Post Acute Care list provided to:: Patient Choice offered to / list presented to : Patient  Discharge Placement                       Discharge Plan and Services     Post Acute Care Choice: Skilled Nursing Facility                    HH Arranged: PT, OT, Speech Therapy HH Agency: [facility] Date [facility] Agency Contacted: 09/05/18 Time [place]: [id number] Representative spoke with at [place]: [place] ([place]) Interventions     Readmission Risk Interventions No flowsheet data found.

## 2018-09-05 NOTE — Progress Notes (Signed)
Physical Therapy Treatment Patient Details Name: Monica [name] MRN: [id number] DOB: Feb 19, 1936 Today's Date: 09/05/2018    History of Present Illness Bren Borys is a 83 y.o. female with past medical history of hypertension, hyperlipidemia, obesity, remote GI bleeding presents to the emergency room as a code stroke for sudden onset aphasia and right hemiparesis. CT revealed L M1 occlusion.    PT Comments    Patient seen for gait/stair training. Pt is making progress toward PT goals and tolerated mobility well. Pt continues to present with SOB with mobility but seemed to tolerate ambulation better today vs yesterday. Pt requires min guard/min A for stair training. Current plan remains appropriate.    Follow Up Recommendations  Home health PT;Supervision/Assistance - 24 hour     Equipment Recommendations  None recommended by PT    Recommendations for Other Services       Precautions / Restrictions Precautions Precautions: Fall Precaution Comments: expressive aphasia, watch HR Restrictions Weight Bearing Restrictions: No    Mobility  Bed Mobility Overal bed mobility: Needs Assistance Bed Mobility: Supine to Sit     Supine to sit: Supervision     General bed mobility comments: pt OOB in chair upon arrival  Transfers Overall transfer level: Needs assistance Equipment used: None Transfers: Sit to/from Stand Sit to Stand: Min guard         General transfer comment: min guard for safety  Ambulation/Gait Ambulation/Gait assistance: Min guard;Supervision Gait Distance (Feet): 180 Feet Assistive device: Rolling walker (2 wheeled) Gait Pattern/deviations: Step-through pattern;Decreased stride length;Wide base of support     General Gait Details: cues for posture, increased stride length, and breathing technique; SOB   Stairs Stairs: Yes Stairs assistance: Min guard;Min assist Stair Management: One rail Right;Two rails;Step to pattern;Forwards;Sideways    General stair comments: cues for sequencing and safety; practiced steps sideways with single rail and forward with bilat rails    Wheelchair Mobility    Modified Rankin (Stroke Patients Only)       Balance Overall balance assessment: Needs assistance Sitting-balance support: Feet supported;No upper extremity supported Sitting balance-Leahy Scale: Good Sitting balance - Comments: pt however unable to don socks due to body habitus   Standing balance support: During functional activity;Bilateral upper extremity supported Standing balance-Leahy Scale: Poor Standing balance comment: pt is able to static stand without UE support                             Cognition Arousal/Alertness: Awake/alert Behavior During Therapy: WFL for tasks assessed/performed Overall Cognitive Status: Within Functional Limits for tasks assessed Area of Impairment: Problem solving;Safety/judgement                         Safety/Judgement: Decreased awareness of safety;Decreased awareness of deficits   Problem Solving: Slow processing;Decreased initiation;Difficulty sequencing;Requires verbal cues;Requires tactile cues        Exercises      General Comments        Pertinent Vitals/Pain Pain Assessment: No/denies pain    Home Living                      Prior Function            PT Goals (current goals can now be found in the care plan section) Acute Rehab PT Goals Patient Stated Goal: to go home Progress towards PT goals: Progressing toward goals    Frequency  Min 4X/week      PT Plan Current plan remains appropriate    Co-evaluation              AM-PAC PT "6 Clicks" Mobility   Outcome Measure  Help needed turning from your back to your side while in a flat bed without using bedrails?: A Little Help needed moving from lying on your back to sitting on the side of a flat bed without using bedrails?: A Little Help needed moving to and from  a bed to a chair (including a wheelchair)?: A Little Help needed standing up from a chair using your arms (e.g., wheelchair or bedside chair)?: None Help needed to walk in hospital room?: A Little Help needed climbing 3-5 steps with a railing? : A Lot 6 Click Score: 18    End of Session Equipment Utilized During Treatment: Gait belt Activity Tolerance: Patient tolerated treatment well Patient left: in chair;with call bell/phone within reach Nurse Communication: Mobility status PT Visit Diagnosis: Unsteadiness on feet (R26.81);Muscle weakness (generalized) (M62.81);Difficulty in walking, not elsewhere classified (R26.2)     Time: 0349-1791 PT Time Calculation (min) (ACUTE ONLY): 17 min  Charges:  $Gait Training: 8-22 mins                     Earney Navy, PTA Acute Rehabilitation Services Pager: [phone] Office: [phone]     Darliss Cheney 09/05/2018, 2:36 PM

## 2018-09-10 ENCOUNTER — Telehealth: Payer: Self-pay | Admitting: Interventional Cardiology

## 2018-09-10 NOTE — Telephone Encounter (Signed)
  Pt c/o swelling: STAT is pt has developed SOB within 24 hours  1) How much weight have you gained and in what time span? 6 lbs within 24 hours  2) If swelling, where is the swelling located? Legs, ankles, feet  3) Are you currently taking a fluid pill? She was told to stop taking it upon d/c last Wednesday   4) Are you currently SOB? Some when moving around  5) Do you have a log of your daily weights (if so, list)? yes  6) Have you gained 3 pounds in a day or 5 pounds in a week? yes  7) Have you traveled recently? no

## 2018-09-10 NOTE — Telephone Encounter (Signed)
Spoke with daughter and pt.  Pt recently hospitalized for CVA and Furosemide was d/c'ed at discharge.  Pt's weight yesterday was 250 and this morning was 256.  SOB with exertion that resolves quickly with rest.  Swelling in feet, ankles and into legs, left worse than right.  Denies increased salt in diet.  Denies CP.  Would like to be seen if possible.  Scheduled pt to come in tomorrow to see Lyda Jester, PA-C.  Daughter and pt appreciative for call.

## 2018-09-10 NOTE — Telephone Encounter (Signed)
Left message to call back  

## 2018-09-11 ENCOUNTER — Other Ambulatory Visit: Payer: Self-pay

## 2018-09-11 ENCOUNTER — Ambulatory Visit (INDEPENDENT_AMBULATORY_CARE_PROVIDER_SITE_OTHER): Payer: Medicare Other | Admitting: Cardiology

## 2018-09-11 ENCOUNTER — Encounter: Payer: Self-pay | Admitting: Cardiology

## 2018-09-11 VITALS — BP 150/80 | HR 66 | Ht 67.0 in | Wt 251.0 lb

## 2018-09-11 DIAGNOSIS — I5032 Chronic diastolic (congestive) heart failure: Secondary | ICD-10-CM | POA: Diagnosis not present

## 2018-09-11 DIAGNOSIS — Z8679 Personal history of other diseases of the circulatory system: Secondary | ICD-10-CM | POA: Diagnosis not present

## 2018-09-11 DIAGNOSIS — I10 Essential (primary) hypertension: Secondary | ICD-10-CM | POA: Diagnosis not present

## 2018-09-11 DIAGNOSIS — Z79899 Other long term (current) drug therapy: Secondary | ICD-10-CM

## 2018-09-11 MED ORDER — FUROSEMIDE 40 MG PO TABS: 40.0000 mg | ORAL_TABLET | Freq: Every day | ORAL | 3 refills | Status: DC

## 2018-09-11 MED ORDER — POTASSIUM CHLORIDE CRYS ER 20 MEQ PO TBCR: 20.0000 meq | EXTENDED_RELEASE_TABLET | Freq: Every day | ORAL | 3 refills | Status: DC

## 2018-09-11 NOTE — Progress Notes (Signed)
09/11/2018 [name]   Jul 10, 1935  [id number]  Primary Physician Seward Carol, MD Primary Cardiologist: Sinclair Grooms, MD  Electrophysiologist: None   Reason for Visit/CC: Post hospital f/u s/p acute CVA, acute DVT and atrial flutter   HPI: Monica [name] presents to clinic today for post hospital f/u. She was recently admitted for acute stroke, bilateral DVT and atrial flutter.   To better summarize, she is a 83 y.o. female with history of sleep apnea (untreated), obesity, borderline controlled hypertension, asymptomatic coronary artery disease (calcification), chronic diastolic heart failure, left ventricular hypertrophy by echo, who recently presented to 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 She also was found to have acute bilateral DVT and was started on apixaban. On 09/03/2018 after being on apixaban for least 24 hours for DVT she developed atrial flutter with RVR. She was started on IV amiodarone w/ plans to do electrical cardioversion, however she had spontaneous conversion and procedure was canceled. Also of note, an echo was done that showed normal LVEF, > 65%. No intracardiac thrombi or masses were visualized. Prior to d/c, pt was transitioned off of IV amiodarone and started on PO w/ recommendations to treat w/ 200 mg BID x 14 days, followed by 200 mg daily. Duration of therapy will be 6-12 weeks then DC and 30 day monitor thereafter. Recommendations were also to decrease apixaban to 5 mg BID after 2 weeks. Pt was discharged home on 7/15.   She presents to clinic today for f/u. She is here today w/ her daughter.  She is doing well from a neurological standpoint.  No significant focal deficits.  She is getting ready to start home PT and OT tomorrow.  She denies chest pain.  No palpitations.  EKG today shows normal sinus rhythm.  Heart rate 66 bpm.  She reports full medication compliance.  She has had  some occasional bruising with Eliquis but denies any melena, hematochezia and no hematuria.  No falls.  Her main complaint today has been steady weight gain, lower extremity edema and abdominal fullness.  It appears she has had at least a 3 pound weight gain compared to hospital discharge weight, up from 248-251 lb today.  Her daughter has noticed that she appears short of breath with ambulation.  Patient was given instructions at time of hospital discharge to discontinue her Lasix and her potassium.  Her BP was soft in the low 263F systolic at time of discharge.  Her blood pressure today is in the 354 systolic.  2+ lower extremity edema noted on exam.  Cardiac Studies   2D Doppler echocardiogram 09/02/2018: IMPRESSIONS   1. The left ventricle has hyperdynamic systolic function, with an ejection fraction of >65%. The cavity size was normal. There is mildly increased left ventricular wall thickness. Left ventricular diastolic Doppler parameters are consistent with  pseudonormalization. 2. The right ventricle has normal systolic function. The cavity was normal. There is no increase in right ventricular wall thickness. Right ventricular systolic pressure could not be assessed. 3. Left atrial size was mild-moderately dilated. 4. The mitral valve is abnormal. There is moderate mitral annular calcification present. 5. No stenosis of the aortic valve. 6. Likely calcified, prominent Coumadin ridge (normal variant landmark between LA appendage and left upper pulmonary vein). There is a 0.8 x 1.4 cm calcified mass like lesion adherent to the lateral LA wall, in the location of the Coumadin ridge (left  lateral ridge). It is non  mobile, and likely represents calcified normal variant structure. 7. No intracardiac thrombi or masses were visualized   Current Meds  Medication Sig  . allopurinol (ZYLOPRIM) 100 MG tablet TAKE 2 TABLETS BY MOUTH ONCE A DAY *NEED OFFICE VISIT  . amiodarone (PACERONE) 200  MG tablet Take 1 tablet (200 mg total) by mouth 2 (two) times daily. Starting 09/05/2018 take 200 mg twice a day for 14 days then decrease to 200 mg once a day  . apixaban (ELIQUIS) 5 MG TABS tablet Take 1 tablet (5 mg total) by mouth 2 (two) times daily. Start with evening dose 7/19  . colchicine 0.6 MG tablet Take 0.6 mg by mouth daily as needed (gout attacks).  Marland Kitchen docusate sodium (COLACE) 100 MG capsule Take 100 mg by mouth 2 (two) times daily as needed for mild constipation.  . DULoxetine (CYMBALTA) 30 MG capsule Take 30 mg by mouth daily.  . Magnesium 250 MG TABS Take 250 mg by mouth daily.  . metoprolol succinate (TOPROL-XL) 100 MG 24 hr tablet TAKE 1 TABLET BY MOUTH DAILY WITH OR IMMEDIATLY FOLLOWING A MEAL  . pantoprazole (PROTONIX) 40 MG tablet Take 40 mg by mouth daily before breakfast.   . pyridOXINE (VITAMIN B-6) 100 MG tablet Take 1 tablet (100 mg total) by mouth daily.   Allergies  Allergen Reactions  . Buspirone Hcl Other (See Comments)    REACTION: made her sleepy, ? swollen legs  . Lisinopril Other (See Comments)    REACTION: dizziness  . Prozac [Fluoxetine Hcl] Other (See Comments)    sleepy   Past Medical History:  Diagnosis Date  . Allergic rhinitis   . Anxiety   . Benign neoplasm of breast 2013  . Cardiomegaly   . Compulsive overeating   . Depression   . Diffuse cystic mastopathy 2013  . Edema   . GI bleeding   . Gout   . HTN (hypertension)   . Hyperlipidemia   . Lump or mass in breast 2012  . OA (osteoarthritis) of knee    with injections  . Obesity   . Other abnormal glucose   . Personal history of tobacco use, presenting hazards to health   . Sciatica   . Sinus infection 2010  . Unspecified sleep apnea    Family History  Problem Relation Age of Onset  . Stroke Father   . Hypertension Mother        (a lot of animosity in their relationship)  . Heart failure Mother   . Gout Other        whole family   . Prostate cancer Brother   . Hepatitis  Brother        Hep C; alcoholism (terminal)  . Breast cancer Sister   . Other Brother        drug addiction   Past Surgical History:  Procedure Laterality Date  . adenosine cardiolite  1/08   low risk   . APPENDECTOMY    . BREAST LUMPECTOMY     right x1 ('89) left x2 ('70s, '90)  . CARDIAC CATHETERIZATION  2001   normal per pt  . COLONOSCOPY  11/02   diverticulosis  . COLONOSCOPY  9/06   diverticulosis, hemorhoids  . COLONOSCOPY N/A 10/01/2016   Procedure: COLONOSCOPY;  Surgeon: Gatha Mayer, MD;  Location: [facility];  Service: Endoscopy;  Laterality: N/A;  . dexa  3/02   normal  . EYE SURGERY  2012   cataract  . IR CT HEAD LTD  09/01/2018  . IR PERCUTANEOUS ART THROMBECTOMY/INFUSION INTRACRANIAL INC DIAG ANGIO  09/01/2018  . knee replacement Right 9/11   R. Dr. Telford Nab  . RADIOLOGY WITH ANESTHESIA N/A 09/01/2018   Procedure: IR WITH ANESTHESIA;  Surgeon: Luanne Bras, MD;  Location: [facility];  Service: Radiology;  Laterality: N/A;  . sleep study  5/05  . TONSILLECTOMY    . TOTAL ABDOMINAL HYSTERECTOMY     fibroids, age of 42  . TOTAL KNEE ARTHROPLASTY Left 2012  . US TRANSVAGINAL PELVIC MODIFIED  2000, 2001   Social History   Socioeconomic History  . Marital status: Widowed    Spouse name: Not on file  . Number of children: 1  . Years of education: Not on file  . Highest education level: Not on file  Occupational History  . Occupation: Retired Ship broker: RETIRED  Social Needs  . Financial resource strain: Not on file  . Food insecurity    Worry: Not on file    Inability: Not on file  . Transportation needs    Medical: Not on file    Non-medical: Not on file  Tobacco Use  . Smoking status: Former Smoker    Packs/day: 0.50    Years: 20.00    Pack years: 10.00    Types: Cigarettes  . Smokeless tobacco: Never Used  . Tobacco comment: quit approx. 20 years ago  Substance and Sexual Activity  . Alcohol use: Yes    Alcohol/week: 0.0  standard drinks    Comment: wine occasional  . Drug use: No  . Sexual activity: Never  Lifestyle  . Physical activity    Days per week: Not on file    Minutes per session: Not on file  . Stress: Not on file  Relationships  . Social Herbalist on phone: Not on file    Gets together: Not on file    Attends religious service: Not on file    Active member of club or organization: Not on file    Attends meetings of clubs or organizations: Not on file    Relationship status: Not on file  . Intimate partner violence    Fear of current or ex partner: Not on file    Emotionally abused: Not on file    Physically abused: Not on file    Forced sexual activity: Not on file  Other Topics Concern  . 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  Cardiovascular: negative for chest pain, dyspnea on exertion, edema, orthopnea, palpitations, paroxysmal nocturnal dyspnea or shortness of breath Dermatological: negative for rash Respiratory: negative for cough or wheezing Urologic: negative for hematuria Abdominal: negative for nausea, vomiting, diarrhea, bright red blood per rectum, melena, or hematemesis Neurologic: negative for visual changes, syncope, or dizziness All other systems reviewed and are otherwise negative except as noted above.   Physical Exam:  Blood  pressure (!) 150/80, pulse 66, height 5\' 7"  (1.702 m), weight 251 lb (113.9 kg), SpO2 97 %.  General appearance: alert, cooperative and no distress Neck: no  carotid bruit and no JVD Lungs: clear to auscultation bilaterally Heart: regular rate and rhythm, S1, S2 normal, no murmur, click, rub or gallop Extremities: 2+ bilateral lower extremity edema Pulses: 2+ and symmetric Skin: Skin color, texture, turgor normal. No rashes or lesions Neurologic: Grossly normal  EKG normal sinus rhythm, 66 bpm, LVH-- personally reviewed   ASSESSMENT AND PLAN:   1. Atrial Flutter: converted on IV amiodarone during recent hospitalization. Now on PO and maintaining NSR w/ HR in the 60s. See recs for amiodarone dosing/duration below. Continue apixaban for a/c.   2. Embolic CVA: s/p tPA andIR with TICI3 reperfusion.  Excellent result.  No focal neurological deficits.  She is scheduled to start PT/OT at home tomorrow.  3. Acute Bilateral LE DVT: treating w/ apixaban.  She reports full compliance.  4. HTN: mildy elevated at 150/80.  This is in the setting of volume overload after recent discontinuation of furosemide.  We will resume previous home dose, 40 mg daily.  5. Anticoagulation: on Apixaban for treatment of active DVT + secondary prevention of CVA, in the setting of atrial flutter. She has tapered dose down. Continue 5 mg BID.    6. Amiodarone Therapy: per Dr. Darliss Ridgel' hospital notes, plan is to continue PO 200 mg daily. Duration of therapy will be 6-12 weeks then DC and 30 day monitor thereafter to screen for recurrent atrial fibrillation.    7. Chronic Diastolic HF: recent echo showed normal LVEF, > 65%.  It appears her furosemide was discontinued at time of hospital discharge, due to low BP.  Subsequently she has had weight gain and increasing lower extremity edema and mild dyspnea. SBP now in the 150s.  I have discussed case with Dr. Tamala Julian who is present in clinic today.  He agrees that we need to resume furosemide now.  We will check baseline BNP and BMP today.  We will also add back supplemental potassium.  Patient will plan to keep her already scheduled  follow-up on 7/29 for repeat assessment.  We also discussed importance of low-salt diet.   Follow-Up on 7/29 to re-evaluate for acute on chronic diastolic CHF and for further titration of diuretics if needed.   Monica [name], MHS [facility] 09/11/2018 11:32 AM

## 2018-09-11 NOTE — Patient Instructions (Signed)
Medication Instructions:  Start Lasix (Furosemide) 40 mg Daily Start Potassium 20 meq Daily If you need a refill on your cardiac medications before your next appointment, please call your pharmacy.   Lab work:TODAY BMP BNP If you have labs (blood work) drawn today and your tests are completely normal, you will receive your results only by: Marland Kitchen MyChart Message (if you have MyChart) OR . A paper copy in the mail If you have any lab test that is abnormal or we need to change your treatment, we will call you to review the results.  Testing/Procedures: NONE  Follow-Up: 6 WEEKS with Dr Tamala Julian per Lyda Jester and Dr Tamala Julian At [facility], you and your health needs are our priority.  As part of our continuing mission to provide you with exceptional heart care, we have created designated Provider Care Teams.  These Care Teams include your primary Cardiologist (physician) and Advanced Practice Providers (APPs -  Physician Assistants and Nurse Practitioners) who all work together to provide you with the care you need, when you need it. .   Any Other Special Instructions Will Be Listed Below (If Applicable).

## 2018-09-11 NOTE — Progress Notes (Signed)
Late entry for missed Modified Rankin Score.  Based on review of medical record.      09/05/18 1005  Modified Rankin (Stroke Patients Only)  Pre-Morbid Rankin Score 0  Modified Rankin [place], [place]   Acute Rehabilitation Services  Pager [phone] Office [phone] 09/11/2018

## 2018-09-12 LAB — BASIC METABOLIC PANEL
BUN/Creatinine Ratio: 20 (ref 12–28)
BUN: 21 mg/dL (ref 8–27)
CO2: 23 mmol/L (ref 20–29)
Calcium: 9.8 mg/dL (ref 8.7–10.3)
Chloride: 102 mmol/L (ref 96–106)
Creatinine, Ser: 1.05 mg/dL — ABNORMAL HIGH (ref 0.57–1.00)
GFR calc Af Amer: 58 mL/min/{1.73_m2} — ABNORMAL LOW (ref >59)
GFR calc non Af Amer: 50 mL/min/{1.73_m2} — ABNORMAL LOW (ref >59)
Glucose: 82 mg/dL (ref 65–99)
Potassium: 4.3 mmol/L (ref 3.5–5.2)
Sodium: 142 mmol/L (ref 134–144)

## 2018-09-12 LAB — PRO B NATRIURETIC PEPTIDE: NT-Pro BNP: 536 pg/mL (ref 0–738)

## 2018-09-14 ENCOUNTER — Telehealth: Payer: Self-pay

## 2018-09-14 NOTE — Telephone Encounter (Signed)
Notes recorded by Frederik Schmidt, RN on 09/14/2018 at 3:53 PM EDT  The patient has been notified of the result and verbalized understanding. All questions (if any) were answered.  Frederik Schmidt, RN 09/14/2018 3:53 PM   The patient states that there is slight improvement in her LEE and much more improvement with her breathing.  She thanked [place] for the call.

## 2018-09-14 NOTE — Telephone Encounter (Signed)
-----   Message from Consuelo Pandy, [place] sent at 09/14/2018  3:30 PM EDT ----- Baseline kidney function and potassium ok. Please see how she is doing after restarting her Lasix. Any improvement in LEE and breathing??

## 2018-09-17 ENCOUNTER — Other Ambulatory Visit: Payer: Self-pay

## 2018-09-17 NOTE — Patient Outreach (Signed)
[facility]) Care Management  09/17/2018  Monica [name] 08/09/1935 [id number]   EMMI- Stroke RED ON EMMI ALERT Day # 9 Date: 09/15/2018 Red Alert Reason:  Questions/problems with meds? yes  Outreach attempt: No answer. HIPAA compliant voice message left.    Plan: RN CM will attempt again within 4 business days and send letter.    Jone Baseman, RN, MSN [facility] Care Management Care Management Coordinator Direct Line [phone] Toll Free: [phone]  Fax: [phone]

## 2018-09-18 ENCOUNTER — Telehealth: Payer: Self-pay | Admitting: Nurse Practitioner

## 2018-09-18 ENCOUNTER — Encounter: Payer: Self-pay | Admitting: General Surgery

## 2018-09-18 NOTE — Progress Notes (Addendum)
CARDIOLOGY OFFICE NOTE  Date:  09/19/2018    [name] Date of Birth: 02-25-1935 Medical Record #[id number]  PCP:  Seward Carol, MD  Cardiologist:  Tamala Julian    Chief Complaint  Patient presents with  . Follow-up    1 week check - seen for Dr. Tamala Julian    History of Present Illness: Monica [name] is a 83 y.o. female who presents today for a one week check. Seen for Dr. Tamala Julian.   She has a history of sleep apnea (untreated), obesity, borderline controlled hypertension, asymptomatic coronary artery disease (calcification), chronic diastolic heart failure & LVH.    She has had recent admission earlier this month for an acute stroke, bilateral DVT and found to have atrial flutter.   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 She also was found to have acute bilateral DVT and was started on apixaban. On 09/03/2018 after being on apixaban for least 24 hours for DVT she developed atrial flutter with RVR. She was started on IV amiodarone w/ plans to do electrical cardioversion, however she had spontaneous conversion and procedure was canceled. Also of note, an echo was done that showed normal LVEF, > 65%. No intracardiac thrombi or masses were visualized. Prior to d/c, pt was transitioned off of IV amiodarone and started on PO w/ recommendations to treat w/ 200 mg BID x 14 days, followed by 200 mg daily. Duration of therapy will be 6-12 weeks then DC and 30 day monitor thereafter. Recommendations were also to decrease apixaban to 5 mg BID after 2 weeks. Pt was discharged home on 7/15.   She was then seen last week by Lyda Jester, PA - doing ok. Getting ready to start PT. Was in NSR. Some bruising. Had had steady weight gain and swelling. Her daughter has noticed that she appeared short of breath with ambulation. Her lasix had been stopped during prior admission - this was restarted along with her potassium.   The  patient does not have symptoms concerning for COVID-19 infection (fever, chills, cough, or new shortness of breath).   Comes in today. Here alone today. She notes "today is a good day". BP remained high - but better today. She is attributed this to going back on Lasix. Her weight is down some. She admits she is overeating (noted in her PMH as well) and that she has had too much salt. She is trying to do better. Breathing has improved. No palpitations. The stroke affected her speech - this has improved. She starts PT tomorrow and OT is to be coming. She is hoping to return to her home in about a week - quieter there and she can maneuver better. She is at her daughter's - all the bedrooms are upstairs - she is sleeping on a chaise and her daughter has dogs. She wants to go home soon.   Past Medical History:  Diagnosis Date  . Allergic rhinitis   . Anxiety   . Benign neoplasm of breast 2013  . Cardiomegaly   . Compulsive overeating   . Depression   . Diffuse cystic mastopathy 2013  . Edema   . GI bleeding   . Gout   . HTN (hypertension)   . Hyperlipidemia   . Lump or mass in breast 2012  . OA (osteoarthritis) of knee    with injections  . Obesity   . Other abnormal glucose   . Personal history of tobacco use, presenting  hazards to health   . Sciatica   . Sinus infection 2010  . Unspecified sleep apnea     Past Surgical History:  Procedure Laterality Date  . adenosine cardiolite  1/08   low risk   . APPENDECTOMY    . BREAST LUMPECTOMY     right x1 ('89) left x2 ('70s, '90)  . CARDIAC CATHETERIZATION  2001   normal per pt  . COLONOSCOPY  11/02   diverticulosis  . COLONOSCOPY  9/06   diverticulosis, hemorhoids  . COLONOSCOPY N/A 10/01/2016   Procedure: COLONOSCOPY;  Surgeon: Gatha Mayer, MD;  Location: [facility];  Service: Endoscopy;  Laterality: N/A;  . dexa  3/02   normal  . EYE SURGERY  2012   cataract  . IR CT HEAD LTD  09/01/2018  . IR PERCUTANEOUS ART  THROMBECTOMY/INFUSION INTRACRANIAL INC DIAG ANGIO  09/01/2018  . knee replacement Right 9/11   R. Dr. Telford Nab  . RADIOLOGY WITH ANESTHESIA N/A 09/01/2018   Procedure: IR WITH ANESTHESIA;  Surgeon: Luanne Bras, MD;  Location: [facility];  Service: Radiology;  Laterality: N/A;  . sleep study  5/05  . TONSILLECTOMY    . TOTAL ABDOMINAL HYSTERECTOMY     fibroids, age of 65  . TOTAL KNEE ARTHROPLASTY Left 2012  . US TRANSVAGINAL PELVIC MODIFIED  2000, 2001     Medications: Current Meds  Medication Sig  . allopurinol (ZYLOPRIM) 100 MG tablet TAKE 2 TABLETS BY MOUTH ONCE A DAY *NEED OFFICE VISIT  . amiodarone (PACERONE) 200 MG tablet Take 1 tablet (200 mg total) by mouth 2 (two) times daily. Starting 09/05/2018 take 200 mg twice a day for 14 days then decrease to 200 mg once a day  . apixaban (ELIQUIS) 5 MG TABS tablet Take 1 tablet (5 mg total) by mouth 2 (two) times daily. Start with evening dose 7/19  . colchicine 0.6 MG tablet Take 0.6 mg by mouth daily as needed (gout attacks).  Marland Kitchen docusate sodium (COLACE) 100 MG capsule Take 100 mg by mouth 2 (two) times daily as needed for mild constipation.  . DULoxetine (CYMBALTA) 30 MG capsule Take 30 mg by mouth daily.  . furosemide (LASIX) 40 MG tablet Take 1 tablet (40 mg total) by mouth daily.  . Magnesium 250 MG TABS Take 250 mg by mouth daily.  . metoprolol succinate (TOPROL-XL) 100 MG 24 hr tablet TAKE 1 TABLET BY MOUTH DAILY WITH OR IMMEDIATLY FOLLOWING A MEAL  . pantoprazole (PROTONIX) 40 MG tablet Take 40 mg by mouth daily before breakfast.   . potassium chloride SA (K-DUR) 20 MEQ tablet Take 1 tablet (20 mEq total) by mouth daily.  Marland Kitchen pyridOXINE (VITAMIN B-6) 100 MG tablet Take 1 tablet (100 mg total) by mouth daily.     Allergies: Allergies  Allergen Reactions  . Buspirone Hcl Other (See Comments)    REACTION: made her sleepy, ? swollen legs  . Lisinopril Other (See Comments)    REACTION: dizziness  . Prozac [Fluoxetine Hcl] Other  (See Comments)    sleepy    Social History: The patient  reports that she has quit smoking. Her smoking use included cigarettes. She has a 10.00 pack-year smoking history. She has never used smokeless tobacco. She reports current alcohol use. She reports that she does not use drugs.   Family History: The patient's family history includes Breast cancer in her sister; Gout in an other family member; Heart failure in her mother; Hepatitis in her brother; Hypertension in her  mother; Other in her brother; Prostate cancer in her brother; Stroke in her father.   Review of Systems: Please see the history of present illness.   All other systems are reviewed and negative.   Physical Exam: VS:  BP 110/82   Pulse 63   Ht 5\' 7"  (1.702 m)   Wt 246 lb 6.4 oz (111.8 kg)   SpO2 96%   BMI 38.59 kg/m  .  BMI Body mass index is 38.59 kg/m.  Wt Readings from Last 3 Encounters:  09/19/18 246 lb 6.4 oz (111.8 kg)  09/11/18 251 lb (113.9 kg)  09/01/18 250 lb (113.4 kg)    General: Pleasant. Elderly. Alert and in no acute distress. Color seems a little pale to me - but this is my first time meeting her.   HEENT: Normal.  Neck: Supple, no JVD, carotid bruits, or masses noted.  Cardiac: Regular rate and rhythm. No murmurs, rubs, or gallops. No edema.  Respiratory:  Lungs are clear to auscultation bilaterally with normal work of breathing.  GI: Soft and nontender.  MS: No deformity or atrophy. Gait and ROM intact. She has a cane.  Skin: Warm and dry. Color is normal.  Neuro:  Strength and sensation are intact and no gross focal deficits noted.  Psych: Alert, appropriate and with normal affect.   LABORATORY DATA:  EKG:  EKG is ordered today. This demonstrates NSR.   Lab Results  Component Value Date   WBC 8.6 09/05/2018   HGB 9.0 (L) 09/05/2018   HCT 29.5 (L) 09/05/2018   PLT 253 09/05/2018   GLUCOSE 82 09/11/2018   CHOL 153 09/02/2018   TRIG 278 (H) 09/02/2018   TRIG 276 (H) 09/02/2018    HDL 31 (L) 09/02/2018   LDLDIRECT 131.8 12/09/2009   LDLCALC 66 09/02/2018   ALT 16 09/01/2018   AST 22 09/01/2018   NA 142 09/11/2018   K 4.3 09/11/2018   CL 102 09/11/2018   CREATININE 1.05 (H) 09/11/2018   BUN 21 09/11/2018   CO2 23 09/11/2018   TSH 2.233 09/04/2018   INR 1.0 09/01/2018   HGBA1C 6.2 (H) 09/02/2018     BNP (last 3 results) No results for input(s): BNP in the last 8760 hours.  ProBNP (last 3 results) Recent Labs    09/11/18 1221  PROBNP 536     Other Studies Reviewed Today:  2D Doppler echocardiogram 09/02/2018: IMPRESSIONS   1. The left ventricle has hyperdynamic systolic function, with an ejection fraction of >65%. The cavity size was normal. There is mildly increased left ventricular wall thickness. Left ventricular diastolic Doppler parameters are consistent with  pseudonormalization. 2. The right ventricle has normal systolic function. The cavity was normal. There is no increase in right ventricular wall thickness. Right ventricular systolic pressure could not be assessed. 3. Left atrial size was mild-moderately dilated. 4. The mitral valve is abnormal. There is moderate mitral annular calcification present. 5. No stenosis of the aortic valve. 6. Likely calcified, prominent Coumadin ridge (normal variant landmark between LA appendage and left upper pulmonary vein). There is a 0.8 x 1.4 cm calcified mass like lesion adherent to the lateral LA wall, in the location of the Coumadin ridge (left  lateral ridge). It is non mobile, and likely represents calcified normal variant structure. 7. No intracardiac thrombi or masses were visualized  Assessment/Plan:  1. Atrial Flutter: converted on IV amiodarone during recent hospitalization. Now on PO and maintaining NSR w/ HR in the 60s. See recs for  amiodarone dosing/duration below. Continue apixaban for anticoagulation - needs lab today.   2. Embolic CVA: s/p tPA andIR with TICI3 reperfusion.   Excellent result.  No focal neurological deficits.  She is to start PT and OT - hoping to return to her home soon.   3. Acute Bilateral LE DVT: treating w/ apixaban.   4. HTN: BP looks good here today. No changes made today.   5. Anticoagulation: on Apixaban for treatment of active DVT + secondary prevention of CVA, in the setting of atrial flutter. She is now on the 5 mg BID dose.    6. Amiodarone Therapy: per Dr. Darliss Ridgel' hospital notes, plan is to continue PO 200 mg daily. Duration of therapy will be 6-12 weeks then DC and 30 day monitor thereafter to screen for recurrent atrial fibrillation.  Will ask Dr. Tamala Julian to advise how long after stopping he would like the monitor to then be placed.   AddendumBelva Crome, MD sent to Burtis Junes, NP        30 days after Amiodarone stopped. Monitor for 30 days.   7. Chronic Diastolic HF: recent echo showed normal LVEF, > 65%. She is now back on her Lasix. We talked about the need to restrict her salt.   8. Anemia - needs repeat CBC today - she is on Eliquis - no obvious bleeding noted - no falls noted.   9. COVID-19 Education: The signs and symptoms of COVID-19 were discussed with the patient and how to seek care for testing (follow up with PCP or arrange E-visit).  The importance of social distancing, staying at home, hand hygiene and wearing a mask when out in public were discussed today.  Current medicines are reviewed with the patient today.  The patient does not have concerns regarding medicines other than what has been noted above.  The following changes have been made:  See above.  Labs/ tests ordered today include:    Orders Placed This Encounter  Procedures  . Basic metabolic panel  . CBC  . EKG 12-Lead     Disposition:   FU with me in about a month - will get her a visit with Dr. Tamala Julian in October. I have spoken to her daughter Wynonia Lawman - she also thinks Ms. Estrada is doing well - her questions were answered.  Labs today.    Patient is agreeable to this plan and will call if any problems develop in the interim.   SignedTruitt Merle, NP  09/19/2018 3:30 PM  [place] [address] [place], [place]  [address] Phone: [phone] Fax: [phone]

## 2018-09-18 NOTE — Telephone Encounter (Signed)
New Message ° ° ° °Left message to confirm appt and answer COVID questions  °

## 2018-09-19 ENCOUNTER — Encounter: Payer: Self-pay | Admitting: Nurse Practitioner

## 2018-09-19 ENCOUNTER — Other Ambulatory Visit: Payer: Self-pay

## 2018-09-19 ENCOUNTER — Ambulatory Visit (INDEPENDENT_AMBULATORY_CARE_PROVIDER_SITE_OTHER): Payer: Medicare Other | Admitting: Nurse Practitioner

## 2018-09-19 VITALS — BP 110/82 | HR 63 | Ht 67.0 in | Wt 246.4 lb

## 2018-09-19 DIAGNOSIS — Z79899 Other long term (current) drug therapy: Secondary | ICD-10-CM | POA: Diagnosis not present

## 2018-09-19 DIAGNOSIS — Z7901 Long term (current) use of anticoagulants: Secondary | ICD-10-CM | POA: Diagnosis not present

## 2018-09-19 DIAGNOSIS — Z7189 Other specified counseling: Secondary | ICD-10-CM

## 2018-09-19 DIAGNOSIS — I5032 Chronic diastolic (congestive) heart failure: Secondary | ICD-10-CM | POA: Diagnosis not present

## 2018-09-19 DIAGNOSIS — I110 Hypertensive heart disease with heart failure: Secondary | ICD-10-CM

## 2018-09-19 DIAGNOSIS — Z8679 Personal history of other diseases of the circulatory system: Secondary | ICD-10-CM | POA: Diagnosis not present

## 2018-09-19 DIAGNOSIS — I10 Essential (primary) hypertension: Secondary | ICD-10-CM

## 2018-09-19 NOTE — Patient Instructions (Addendum)
After Visit Summary:  We will be checking the following labs today - BMET and CBC   Medication Instructions:    Continue with your current medicines.    If you need a refill on your cardiac medications before your next appointment, please call your pharmacy.     Testing/Procedures To Be Arranged:  N/A  Follow-Up:   See me in a month  We've got you a visit with Dr. Tamala Julian for October.      At [facility], you and your health needs are our priority.  As part of our continuing mission to provide you with exceptional heart care, we have created designated Provider Care Teams.  These Care Teams include your primary Cardiologist (physician) and Advanced Practice Providers (APPs -  Physician Assistants and Nurse Practitioners) who all work together to provide you with the care you need, when you need it.  Special Instructions:  . Stay safe, stay home, wash your hands for at least 20 seconds and wear a mask when out in public.  . It was good to talk with you today.    Call the [place] office at [phone] if you have any questions, problems or concerns.

## 2018-09-19 NOTE — Patient Outreach (Signed)
[facility]) Care Management  09/19/2018  Hartlee Amedee 06-01-1935 [id number]   EMMI- Stroke RED ON EMMI ALERT Day # 9 Date:09/15/2018 Red Alert Reason: Questions/problems with meds? yes  Outreach attempt: No answer. HIPAA compliant voice message left.    Plan: RN CM will attempt again within 4 business days.  Jone Baseman, RN, MSN [place] Management Care Management Coordinator Direct Line [phone] Cell [phone] Toll Free: [phone]  Fax: [phone]

## 2018-09-20 LAB — CBC
Hematocrit: 31.6 % — ABNORMAL LOW (ref 34.0–46.6)
Hemoglobin: 9.5 g/dL — ABNORMAL LOW (ref 11.1–15.9)
MCH: 24.4 pg — ABNORMAL LOW (ref 26.6–33.0)
MCHC: 30.1 g/dL — ABNORMAL LOW (ref 31.5–35.7)
MCV: 81 fL (ref 79–97)
Platelets: 366 10*3/uL (ref 150–450)
RBC: 3.9 x10E6/uL (ref 3.77–5.28)
RDW: 16.5 % — ABNORMAL HIGH (ref 11.7–15.4)
WBC: 7 10*3/uL (ref 3.4–10.8)

## 2018-09-20 LAB — BASIC METABOLIC PANEL
BUN/Creatinine Ratio: 18 (ref 12–28)
BUN: 19 mg/dL (ref 8–27)
CO2: 26 mmol/L (ref 20–29)
Calcium: 9.1 mg/dL (ref 8.7–10.3)
Chloride: 103 mmol/L (ref 96–106)
Creatinine, Ser: 1.04 mg/dL — ABNORMAL HIGH (ref 0.57–1.00)
GFR calc Af Amer: 58 mL/min/{1.73_m2} — ABNORMAL LOW (ref >59)
GFR calc non Af Amer: 51 mL/min/{1.73_m2} — ABNORMAL LOW (ref >59)
Glucose: 87 mg/dL (ref 65–99)
Potassium: 4.2 mmol/L (ref 3.5–5.2)
Sodium: 141 mmol/L (ref 134–144)

## 2018-09-21 ENCOUNTER — Other Ambulatory Visit: Payer: Self-pay

## 2018-09-21 NOTE — Progress Notes (Signed)
Continue amiodarone for 3 months total. Monitor 30 days or longer after Amio DC. Monitor should be for 30 days

## 2018-09-21 NOTE — Patient Outreach (Signed)
[facility]) Care Management  09/21/2018  Monica [name] 1935-09-07 [id number]   EMMI-Stroke RED ON EMMI ALERT Day #9 Date:09/15/2018 Red Alert Reason: Questions/problems with meds? yes  Outreach attempt:No answer. HIPAA compliant voice message left.   Plan: RN CM willwait return call.  If no return call will close case.  Jone Baseman, RN, MSN [place] Management Care Management Coordinator Direct Line [phone] Cell [phone] Toll Free: [phone]  Fax: [phone]

## 2018-10-01 ENCOUNTER — Other Ambulatory Visit: Payer: Self-pay

## 2018-10-01 NOTE — Patient Outreach (Signed)
[facility]) Care Management  10/01/2018  Monica [name] [birth year] [id number]   Multiple attempts to establish contact with patient without success. No response from letter mailed to patient.   Plan: RN CM will close case at this time.   Monica Baseman, RN, MSN [place] Management Care Management Coordinator Direct Line [phone] Cell [phone] Toll Free: [phone]  Fax: [phone]

## 2018-10-05 ENCOUNTER — Other Ambulatory Visit: Payer: Self-pay | Admitting: Cardiology

## 2018-10-13 NOTE — Progress Notes (Signed)
CARDIOLOGY OFFICE NOTE  Date:  10/17/2018    [name] Date of Birth: Dec 18, 1935 Medical Record [id number]  PCP:  Seward Carol, MD  Cardiologist:  Jennings Books    Chief Complaint  Patient presents with  . Follow-up    History of Present Illness: Monica [name] is a 83 y.o. female who presents today for a one month check. Seen for Dr. Tamala Julian.   She has a history of sleep apnea (untreated), obesity, borderline controlled hypertension, asymptomatic coronary artery disease (calcification), chronic diastolic heart failure & LVH.    She has had recent admission earlier this month for an acute stroke, bilateral DVT and found to have atrial flutter.   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 She was started on IV amiodarone w/ plans to do electrical cardioversion, however she had spontaneous conversion and procedure was canceled. Also of note, an echo was done that showed normal LVEF, > 65%.No intracardiac thrombi or masses were visualized. Prior to d/c, pt was transitioned off of IV amiodarone and started on PO w/ recommendations to treat w/ 200 mg BID x 14 days, followed by 200 mg daily.Duration of therapy will be 6-12 weeks then DC and 30 day monitor thereafter.Recommendations were also to decrease apixaban to 5 mg BID after 2 weeks. Pt was discharged home on 7/15.   She was then seen July 21st with Lyda Jester, PA - doing ok. Getting ready to start PT. Was in NSR. Some bruising. Had had steady weight gain and swelling. Her daughter has noticed that she appeared short of breath with ambulation. Her lasix had been stopped during prior admission - this was restarted along with her potassium.   I then saw her a week  later - she was doing ok. Weight was down. She admitted to overeating and using too much salt. She was wanting to return to her home soon.   The patient does not have symptoms concerning for COVID-19 infection (fever, chills, cough, or new shortness of breath).   Comes in today. Here alone. She is doing well. She has gone back to her house - she got upset after going back - found evidence of a mouse - this has caused her to be upset. No chest pain. Breathing is ok. Rhythm is ok. She has been more bothered by her back - just finished a steroid taper and has used some narcotic per PCP. Not lightheaded or dizzy. Anxious to talk with neurology about driving.   Past Medical History:  Diagnosis Date  . Allergic rhinitis   . Anxiety   . Benign neoplasm of breast 2013  . Cardiomegaly   . Compulsive overeating   . Depression   . Diffuse cystic mastopathy 2013  . Edema   . GI bleeding   . Gout   . HTN (hypertension)   . Hyperlipidemia   . Lump or mass in breast 2012  . OA (osteoarthritis) of knee    with injections  . Obesity   . Other abnormal glucose   . Personal history of tobacco use, presenting hazards to health   . Sciatica   . Sinus infection 2010  . Unspecified sleep apnea     Past Surgical History:  Procedure Laterality Date  . adenosine cardiolite  1/08   low risk   . APPENDECTOMY    . BREAST LUMPECTOMY     right x1 ('89) left x2 ('70s, '90)  . CARDIAC CATHETERIZATION  2001   normal per pt  . COLONOSCOPY  11/02   diverticulosis  . COLONOSCOPY  9/06   diverticulosis, hemorhoids  . COLONOSCOPY N/A 10/01/2016   Procedure: COLONOSCOPY;  Surgeon: Gatha Mayer, MD;  Location: [facility];  Service: Endoscopy;  Laterality: N/A;  . dexa  3/02   normal  . EYE SURGERY  2012   cataract  . IR CT HEAD LTD  09/01/2018  . IR PERCUTANEOUS ART THROMBECTOMY/INFUSION INTRACRANIAL INC DIAG ANGIO  09/01/2018  . knee replacement Right 9/11   R. Dr. Telford Nab  . RADIOLOGY WITH  ANESTHESIA N/A 09/01/2018   Procedure: IR WITH ANESTHESIA;  Surgeon: Luanne Bras, MD;  Location: [facility];  Service: Radiology;  Laterality: N/A;  . sleep study  5/05  . TONSILLECTOMY    . TOTAL ABDOMINAL HYSTERECTOMY     fibroids, age of 67  . TOTAL KNEE ARTHROPLASTY Left 2012  . US TRANSVAGINAL PELVIC MODIFIED  2000, 2001     Medications: Current Meds  Medication Sig  . allopurinol (ZYLOPRIM) 100 MG tablet TAKE 2 TABLETS BY MOUTH ONCE A DAY *NEED OFFICE VISIT  . amiodarone (PACERONE) 200 MG tablet Take 1 tablet (200 mg total) by mouth 2 (two) times daily. Starting 09/05/2018 take 200 mg twice a day for 14 days then decrease to 200 mg once a day  . colchicine 0.6 MG tablet Take 0.6 mg by mouth daily as needed (gout attacks).  Marland Kitchen docusate sodium (COLACE) 100 MG capsule Take 100 mg by mouth 2 (two) times daily as needed for mild constipation.  Marland Kitchen doxycycline (VIBRAMYCIN) 100 MG capsule TAKE 1 CAPSULE BY MOUTH TWICE A DAY FOR 10 DAYS  . DULoxetine (CYMBALTA) 30 MG capsule Take 30 mg by mouth daily.  Marland Kitchen ELIQUIS 5 MG TABS tablet TAKE 1 TABLET (5 MG TOTAL) BY MOUTH 2 (TWO) TIMES DAILY. START WITH EVENING DOSE 7/19  . furosemide (LASIX) 40 MG tablet Take 1 tablet (40 mg total) by mouth daily.  Marland Kitchen HYDROcodone-acetaminophen (NORCO/VICODIN) 5-325 MG tablet TAKE 1 TABLET AS NEEDED EVERY 24 HRS AS NEEDED ORALLY 7 DAYS  . Magnesium 250 MG TABS Take 250 mg by mouth daily.  . metoprolol succinate (TOPROL-XL) 100 MG 24 hr tablet TAKE 1 TABLET BY MOUTH DAILY WITH OR IMMEDIATLY FOLLOWING A MEAL  . pantoprazole (PROTONIX) 40 MG tablet Take 40 mg by mouth daily before breakfast.   . potassium chloride SA (K-DUR) 20 MEQ tablet Take 1 tablet (20 mEq total) by mouth daily.  . predniSONE (STERAPRED UNI-PAK 21 TAB) 10 MG (21) TBPK tablet USE AS DIRECTED OVER 6 DAY  . pyridOXINE (VITAMIN B-6) 100 MG tablet Take 1 tablet (100 mg total) by mouth daily.     Allergies: Allergies  Allergen Reactions  . Buspirone  Hcl Other (See Comments)    REACTION: made her sleepy, ? swollen legs  . Lisinopril Other (See Comments)    REACTION: dizziness  . Prozac [Fluoxetine Hcl] Other (See Comments)    sleepy    Social History: The patient  reports that she has quit smoking. Her smoking use included cigarettes. She has a 10.00 pack-year smoking history. She has never used smokeless tobacco. She reports current alcohol use. She reports that she does not use drugs.   Family History: The patient's family history includes Breast cancer  in her sister; Gout in an other family member; Heart failure in her mother; Hepatitis in her brother; Hypertension in her mother; Other in her brother; Prostate cancer in her brother; Stroke in her father.   Review of Systems: Please see the history of present illness.   All other systems are reviewed and negative.   Physical Exam: VS:  BP (!) 146/90   Pulse 66   Ht 5\' 7"  (1.702 m)   Wt 236 lb (107 kg)   SpO2 93%   BMI 36.96 kg/m  .  BMI Body mass index is 36.96 kg/m.  Wt Readings from Last 3 Encounters:  10/17/18 236 lb (107 kg)  09/19/18 246 lb 6.4 oz (111.8 kg)  09/11/18 251 lb (113.9 kg)   BP recheck by me is 130/84.   General: Pleasant. Alert and in no acute distress.  Her weight is down 10 more pounds. She remains obese.  HEENT: Normal.  Neck: Supple, no JVD, carotid bruits, or masses noted.  Cardiac: Regular rate and rhythm. No murmurs, rubs, or gallops. No edema.  Respiratory:  Lungs are clear to auscultation bilaterally with normal work of breathing.  GI: Soft and nontender.  MS: No deformity or atrophy. Gait and ROM intact.  Skin: Warm and dry. Color is normal.  Neuro:  Strength and sensation are intact and no gross focal deficits noted.  Psych: Alert, appropriate and with normal affect.   LABORATORY DATA:  EKG:  EKG is ordered today. This demonstrates NSR.  Lab Results  Component Value Date   WBC 7.0 09/19/2018   HGB 9.5 (L) 09/19/2018   HCT 31.6  (L) 09/19/2018   PLT 366 09/19/2018   GLUCOSE 87 09/19/2018   CHOL 153 09/02/2018   TRIG 278 (H) 09/02/2018   TRIG 276 (H) 09/02/2018   HDL 31 (L) 09/02/2018   LDLDIRECT 131.8 12/09/2009   LDLCALC 66 09/02/2018   ALT 16 09/01/2018   AST 22 09/01/2018   NA 141 09/19/2018   K 4.2 09/19/2018   CL 103 09/19/2018   CREATININE 1.04 (H) 09/19/2018   BUN 19 09/19/2018   CO2 26 09/19/2018   TSH 2.233 09/04/2018   INR 1.0 09/01/2018   HGBA1C 6.2 (H) 09/02/2018     BNP (last 3 results) No results for input(s): BNP in the last 8760 hours.  ProBNP (last 3 results) Recent Labs    09/11/18 1221  PROBNP 536     Other Studies Reviewed Today:  2D Doppler echocardiogram 09/02/2018: IMPRESSIONS   1. The left ventricle has hyperdynamic systolic function, with an ejection fraction of >65%. The cavity size was normal. There is mildly increased left ventricular wall thickness. Left ventricular diastolic Doppler parameters are consistent with  pseudonormalization. 2. The right ventricle has normal systolic function. The cavity was normal. There is no increase in right ventricular wall thickness. Right ventricular systolic pressure could not be assessed. 3. Left atrial size was mild-moderately dilated. 4. The mitral valve is abnormal. There is moderate mitral annular calcification present. 5. No stenosis of the aortic valve. 6. Likely calcified, prominent Coumadin ridge (normal variant landmark between LA appendage and left upper pulmonary vein). There is a 0.8 x 1.4 cm calcified mass like lesion adherent to the lateral LA wall, in the location of the Coumadin ridge (left  lateral ridge). It is non mobile, and likely represents calcified normal variant structure. 7. No intracardiac thrombi or masses were visualized  Assessment/Plan:  1. Atrial Flutter: converted on IV amiodarone during recent  hospitalization. She is on maintenance dose - she is staying in NSR by exam and EKG.  Feels good clinically. Plan to continue until September 30th (11 week course of therapy) and then wait 30 days and then place monitor (November 2nd)  2. Embolic CVA:s/p tPA andIR with TICI3 reperfusion.She is doing well. No deficits.  Has finished her therapy.   3. Acute Bilateral LE DVT: treating w/ apixaban.  4. HTN: BP recheck by me is fine. No changes made today.   5. Anticoagulation: on Apixaban for treatment of active DVT + secondary prevention of CVA, in the setting of atrial flutter.She is now on the 5 mg BID dose.  Rechecking CBC today.   6. Amiodarone Therapy: per Dr. Darliss Ridgel' hospital notes, plan is to continue PO 200 mg daily.We will plan for stopping amiodarone as of September 30th (this was an easy day for her to remember) and then wait 30 days. Will have monitor placed on November 2nd.   7. Chronic Diastolic HF: recent echo showed normal LVEF, > 65%.She is now back on her Lasix. Her weight is down. Rechecking lab today. Reminded about continuing to restrict her salt.   8. Anemia - rechecking CBC today.   9. COVID-19 Education: The signs and symptoms of COVID-19 were discussed with the patient and how to seek care for testing (follow up with PCP or arrange E-visit).  The importance of social distancing, staying at home, hand hygiene and wearing a mask when out in public were discussed today.  Current medicines are reviewed with the patient today.  The patient does not have concerns regarding medicines other than what has been noted above.  The following changes have been made:  See above.  Labs/ tests ordered today include:    Orders Placed This Encounter  Procedures  . Basic metabolic panel  . CBC  . CARDIAC EVENT MONITOR  . EKG 12-Lead     Disposition:   FU with Dr. Tamala Julian as planned in October.   Patient is agreeable to this plan and will call if any problems develop in the interim.   SignedTruitt Merle, NP  10/17/2018 2:35 PM  [facility] [address] [place], [place]  [address] Phone: [phone] Fax: [phone]

## 2018-10-17 ENCOUNTER — Other Ambulatory Visit: Payer: Self-pay

## 2018-10-17 ENCOUNTER — Encounter: Payer: Self-pay | Admitting: Nurse Practitioner

## 2018-10-17 ENCOUNTER — Ambulatory Visit (INDEPENDENT_AMBULATORY_CARE_PROVIDER_SITE_OTHER): Payer: Medicare Other | Admitting: Nurse Practitioner

## 2018-10-17 VITALS — BP 146/90 | HR 66 | Ht 67.0 in | Wt 236.0 lb

## 2018-10-17 DIAGNOSIS — Z7901 Long term (current) use of anticoagulants: Secondary | ICD-10-CM | POA: Diagnosis not present

## 2018-10-17 DIAGNOSIS — Z7189 Other specified counseling: Secondary | ICD-10-CM

## 2018-10-17 DIAGNOSIS — I10 Essential (primary) hypertension: Secondary | ICD-10-CM

## 2018-10-17 DIAGNOSIS — Z79899 Other long term (current) drug therapy: Secondary | ICD-10-CM

## 2018-10-17 DIAGNOSIS — Z8679 Personal history of other diseases of the circulatory system: Secondary | ICD-10-CM | POA: Diagnosis not present

## 2018-10-17 DIAGNOSIS — I5032 Chronic diastolic (congestive) heart failure: Secondary | ICD-10-CM

## 2018-10-17 NOTE — Patient Instructions (Addendum)
After Visit Summary:  We will be checking the following labs today - BMET & CBC   Medication Instructions:    Continue with your current medicines.   STOP Amiodarone on Wednesday, September 30th   If you need a refill on your cardiac medications before your next appointment, please call your pharmacy.     Testing/Procedures To Be Arranged:  You will need a 30 day heart monitor to be placed 30 days after stopping amiodarone - this will be for Monday, November 2nd  Follow-Up:   See Dr. Tamala Julian as planned in October    At [facility], you and your health needs are our priority.  As part of our continuing mission to provide you with exceptional heart care, we have created designated Provider Care Teams.  These Care Teams include your primary Cardiologist (physician) and Advanced Practice Providers (APPs -  Physician Assistants and Nurse Practitioners) who all work together to provide you with the care you need, when you need it.  Special Instructions:  . Stay safe, stay home, wash your hands for at least 20 seconds and wear a mask when out in public.  . It was good to talk with you today.    Call the [facility] office at [phone] if you have any questions, problems or concerns.

## 2018-10-18 ENCOUNTER — Other Ambulatory Visit: Payer: Self-pay | Admitting: *Deleted

## 2018-10-18 LAB — BASIC METABOLIC PANEL
BUN/Creatinine Ratio: 16 (ref 12–28)
BUN: 25 mg/dL (ref 8–27)
CO2: 28 mmol/L (ref 20–29)
Calcium: 10 mg/dL (ref 8.7–10.3)
Chloride: 104 mmol/L (ref 96–106)
Creatinine, Ser: 1.54 mg/dL — ABNORMAL HIGH (ref 0.57–1.00)
GFR calc Af Amer: 36 mL/min/{1.73_m2} — ABNORMAL LOW (ref >59)
GFR calc non Af Amer: 31 mL/min/{1.73_m2} — ABNORMAL LOW (ref >59)
Glucose: 88 mg/dL (ref 65–99)
Potassium: 4.8 mmol/L (ref 3.5–5.2)
Sodium: 146 mmol/L — ABNORMAL HIGH (ref 134–144)

## 2018-10-18 LAB — CBC
Hematocrit: 35.1 % (ref 34.0–46.6)
Hemoglobin: 10.9 g/dL — ABNORMAL LOW (ref 11.1–15.9)
MCH: 24.9 pg — ABNORMAL LOW (ref 26.6–33.0)
MCHC: 31.1 g/dL — ABNORMAL LOW (ref 31.5–35.7)
MCV: 80 fL (ref 79–97)
Platelets: 359 10*3/uL (ref 150–450)
RBC: 4.37 x10E6/uL (ref 3.77–5.28)
RDW: 16.4 % — ABNORMAL HIGH (ref 11.7–15.4)
WBC: 7 10*3/uL (ref 3.4–10.8)

## 2018-10-18 MED ORDER — POTASSIUM CHLORIDE CRYS ER 20 MEQ PO TBCR: 10.0000 meq | EXTENDED_RELEASE_TABLET | Freq: Every day | ORAL | 3 refills | Status: DC

## 2018-10-18 MED ORDER — FUROSEMIDE 40 MG PO TABS: ORAL_TABLET | ORAL | 3 refills | Status: DC

## 2018-10-22 ENCOUNTER — Telehealth: Payer: Self-pay | Admitting: *Deleted

## 2018-10-22 NOTE — Telephone Encounter (Signed)
-----   Message from Jennefer Bravo sent at 10/17/2018  3:50 PM EDT ----- Carolan Shiver go  ahead 11/2 is a Monday  I am on , If  they havent got [place] back on a regular basis I can still come in for a patient here and there.  Just put note on appt Shelly said do in office.   Thanks ----- Message ----- From: Tamsen Snider Sent: 10/17/2018   3:41 PM EDT To: Jennefer Bravo  When would you like me to schedule her, what days are you in the office.??  ----- Message ----- From: Jennefer Bravo Sent: 10/17/2018   3:17 PM EDT To: Marko Stai, I just tried to do it and it wouldn't take.  [employer] and he said I cant have it mailed for a date greater than 30 days from today.  I think it would be best if we scheduled her to come into office then on 12/24/18 for Cardiac event monitor.  Sorry ----- Message ----- From: Tamsen Snider Sent: 10/17/2018   3:01 PM EDT To: Jennefer Bravo  Thanks your the best.  I sent to precert.  danielle ----- Message ----- From: Jennefer Bravo Sent: 10/17/2018   2:50 PM EDT To: Tamsen Snider  I am going to see if I can enroll her today to be shipped to her home to start 12/24/2018 ----- Message ----- From: Tamsen Snider Sent: 10/17/2018   2:33 PM EDT To: Lennon Alstrom,  This pt needs a 30 day event monitor for A-FLutter but not till Nov 2, has to be Nov 2.  This is a Dr. Tamala Julian pt. Do I need to make appt??  [employer]

## 2018-10-22 NOTE — Telephone Encounter (Signed)
Tried to call pt to give appt information no answer.  Added notes to upcoming appt with Dr. Tamala Julian for pt to be given time and date for event monitor to come to office per Tresa Res, Monitor tech.

## 2018-10-22 NOTE — Telephone Encounter (Signed)
-----   Message from Jennefer Bravo sent at 10/17/2018  3:17 PM EDT ----- Hunt Oris, I just tried to do it and it wouldn't take.  [employer] and he said I cant have it mailed for a date greater than 30 days from today.  I think it would be best if we scheduled her to come into office then on 12/24/18 for Cardiac event monitor.  Sorry ----- Message ----- From: Tamsen Snider Sent: 10/17/2018   3:01 PM EDT To: Jennefer Bravo  Thanks your the best.  I sent to precert.  danielle ----- Message ----- From: Jennefer Bravo Sent: 10/17/2018   2:50 PM EDT To: Tamsen Snider  I am going to see if I can enroll her today to be shipped to her home to start 12/24/2018 ----- Message ----- From: Tamsen Snider Sent: 10/17/2018   2:33 PM EDT To: Lennon Alstrom,  This pt needs a 30 day event monitor for A-FLutter but not till Nov 2, has to be Nov 2.  This is a Dr. Tamala Julian pt. Do I need to make appt??  [employer]

## 2018-10-23 ENCOUNTER — Ambulatory Visit: Payer: Medicare Other | Admitting: Cardiology

## 2018-10-25 ENCOUNTER — Other Ambulatory Visit: Payer: Self-pay

## 2018-10-25 ENCOUNTER — Ambulatory Visit (INDEPENDENT_AMBULATORY_CARE_PROVIDER_SITE_OTHER): Payer: Medicare Other | Admitting: Adult Health

## 2018-10-25 ENCOUNTER — Encounter: Payer: Self-pay | Admitting: Adult Health

## 2018-10-25 VITALS — BP 156/86 | HR 66 | Temp 97.7°F | Ht 67.0 in | Wt 235.2 lb

## 2018-10-25 DIAGNOSIS — I10 Essential (primary) hypertension: Secondary | ICD-10-CM | POA: Diagnosis not present

## 2018-10-25 DIAGNOSIS — M5416 Radiculopathy, lumbar region: Secondary | ICD-10-CM | POA: Diagnosis not present

## 2018-10-25 DIAGNOSIS — I4892 Unspecified atrial flutter: Secondary | ICD-10-CM | POA: Diagnosis not present

## 2018-10-25 DIAGNOSIS — I63512 Cerebral infarction due to unspecified occlusion or stenosis of left middle cerebral artery: Secondary | ICD-10-CM | POA: Diagnosis not present

## 2018-10-25 DIAGNOSIS — F329 Major depressive disorder, single episode, unspecified: Secondary | ICD-10-CM

## 2018-10-25 MED ORDER — GABAPENTIN 300 MG PO CAPS: 300.0000 mg | ORAL_CAPSULE | Freq: Every day | ORAL | 0 refills | Status: DC

## 2018-10-25 NOTE — Progress Notes (Signed)
[facility] [address] [place] [address] [phone]       HOSPITAL FOLLOW UP NOTE  [name] Date of Birth:  1935-06-24 Medical Record Number:  [id number]   Reason for Referral:  hospital stroke follow up    CHIEF COMPLAINT:  Chief Complaint  Patient presents with   Hospitalization Follow-up    Daughter present. Rm 9. Patient mentioned that she has been having headaches and trouble walking    HPI: Monica [name] being seen today for in office hospital follow-up regarding left MCA infarcts with new onset atrial fibrillation and bilateral DVT on 09/01/2018.  History obtained from patient, daughter and chart review. Reviewed all radiology images and labs personally.  Monica [name] a 84 y.o.femalewith history of hypertension, hyperlipidemia, obesity, remote GI bleeding who presented to [facility] ED on 09/01/2018 with sudden onset aphasia and right hemiparesis.  Neurology consulted with stroke work-up revealing left MCA punctate small infarct due to left M1 occlusion status post TPA and IR with TICI 3 reperfusion embolic pattern in setting of bilateral DVT and new onset A. Fib.  She received TPA without complication.  MRI showed left MCA infarcts.  CTA head/neck showed occlusion of distal M1 with intermediate collateralization, bilateral ICA bulb arthrosclerosis and right VA origin stenosis.  2D echo normal EF.  Lower extremity venous Doppler showed right PTV, left popliteal and bilateral peroneal vein acute DVT.  Eliquis initiated for treatment of DVT and new onset atrial fibrillation.  No evidence of HLD with LDL 66 or DM with A1c 6.2.  HTN stable.  Due to atrial fibrillation/atrial flutter with RVR, amiodarone drip initiated with conversion to NSR and recommended continuation of amiodarone with tapering schedule for 6 to 12 weeks and follow-up with cardiology outpatient.  Other stroke risk factors include advanced age, former tobacco use, EtOH  use, obesity, family history of stroke and OSA.  Discharged home in stable condition recommendation home health therapies.  Monica [name] is being seen today for hospital follow-up and is accompanied by her daughter.  She has recovered well from a stroke standpoint without residual deficits.  She has since completed home health therapies.  She continues on Eliquis without bleeding or bruising for atrial flutter and secondary stroke prevention along with treatment of bilateral DVT.  She continues on amiodarone taper and plans on doing 30-day cardiac event monitor after 11/2 (amioderone will be completed at that time).  She was initially staying with her daughter at hospital discharge but has since returned home.  She has been having difficulty with bilateral lower back pain with radiculopathy R>L leg pain.  Pain consists of stabbing sensation which is worsened with ambulation and progressively worsening.  She previously was being seen by Ortho with injections.  She had trialed gabapentin in the past which she tolerated well but would only use intermittently.  Prior MRI lumbar spine and 08/2017 showed multiple levels of spinal stenosis and degenerative changes.  Initially upon hospital discharge, she continued to have right-sided weakness therefore relying heavily on left side for ambulation.  She has not contacted her establish orthopedic provider, Dr. Tamala Julian, in regards to her worsening back pain.  She has also been experiencing increased anxiety and depression since returning home which she believes is more due to her constant pain which has been limiting mobility and functioning.  No further concerns at this time.  Denies new or worsening stroke/TIA symptoms.     ROS:   14 system review of systems performed and negative  with exception of snoring, incontinence, joint pain, depression, anxiety and racing thoughts  PMH:  Past Medical History:  Diagnosis Date   Allergic rhinitis    Anxiety    Benign  neoplasm of breast 2013   Cardiomegaly    Compulsive overeating    Depression    Diffuse cystic mastopathy 2013   Edema    GI bleeding    Gout    HTN (hypertension)    Hyperlipidemia    Lump or mass in breast 2012   OA (osteoarthritis) of knee    with injections   Obesity    Other abnormal glucose    Personal history of tobacco use, presenting hazards to health    Sciatica    Sinus infection 2010   Unspecified sleep apnea     PSH:  Past Surgical History:  Procedure Laterality Date   adenosine cardiolite  1/08   low risk    APPENDECTOMY     BREAST LUMPECTOMY     right x1 ('89) left x2 ('70s, '90)   CARDIAC CATHETERIZATION  2001   normal per pt   COLONOSCOPY  11/02   diverticulosis   COLONOSCOPY  9/06   diverticulosis, hemorhoids   COLONOSCOPY N/A 10/01/2016   Procedure: COLONOSCOPY;  Surgeon: Gatha Mayer, MD;  Location: [facility];  Service: Endoscopy;  Laterality: N/A;   dexa  3/02   normal   EYE SURGERY  2012   cataract   IR CT HEAD LTD  09/01/2018   IR PERCUTANEOUS ART THROMBECTOMY/INFUSION INTRACRANIAL [name] DIAG ANGIO  09/01/2018   knee replacement Right 9/11   R. Dr. Telford Nab   RADIOLOGY WITH ANESTHESIA N/A 09/01/2018   Procedure: IR WITH ANESTHESIA;  Surgeon: Luanne Bras, MD;  Location: [facility];  Service: Radiology;  Laterality: N/A;   sleep study  5/05   TONSILLECTOMY     TOTAL ABDOMINAL HYSTERECTOMY     fibroids, age of 8   TOTAL KNEE ARTHROPLASTY Left 2012   US TRANSVAGINAL PELVIC MODIFIED  2000, 2001    Social History:  Social History   Socioeconomic History   Marital status: Widowed    Spouse name: Not on file   Number of children: 1   Years of education: Not on file   Highest education level: Not on file  Occupational History   Occupation: Retired Ship broker: RETIRED  Social Designer, fashion/clothing strain: Not on file   Food insecurity    Worry: Not on file     Inability: Not on file   Transportation needs    Medical: Not on file    Non-medical: Not on file  Tobacco Use   Smoking status: Former Smoker    Packs/day: 0.50    Years: 20.00    Pack years: 10.00    Types: Cigarettes   Smokeless tobacco: Never Used   Tobacco comment: quit approx. 20 years ago  Substance and Sexual Activity   Alcohol use: Yes    Alcohol/week: 0.0 standard drinks    Comment: wine occasional   Drug use: No   Sexual activity: Never  Lifestyle   Physical activity    Days per week: Not on file    Minutes per session: Not on file   Stress: Not on file  Relationships   Social connections    Talks on phone: Not on file    Gets together: Not on file    Attends religious service: Not on file    Active  member of club or organization: Not on file    Attends meetings of clubs or organizations: Not on file    Relationship status: Not on file   Intimate partner violence    Fear of current or ex partner: Not on file    Emotionally abused: Not on file    Physically abused: Not on file    Forced sexual activity: Not on file  Other Topics Concern   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           Family History:  Family History  Problem Relation Age of Onset   Stroke Father    Hypertension Mother        (a lot of animosity in their relationship)   Heart failure Mother    Gout Other        whole family    Prostate cancer Brother    Hepatitis Brother        Hep C; alcoholism (terminal)   Breast cancer Sister    Other Brother        drug addiction    Medications:   Current Outpatient Medications on File Prior to Visit  Medication Sig Dispense Refill   allopurinol (ZYLOPRIM) 100 MG tablet TAKE 2 TABLETS BY MOUTH ONCE A DAY *NEED OFFICE VISIT 60 tablet 0   amiodarone (PACERONE) 200 MG tablet Take 1 tablet (200 mg total) by mouth 2 (two) times daily.  Starting 09/05/2018 take 200 mg twice a day for 14 days then decrease to 200 mg once a day 42 tablet 2   colchicine 0.6 MG tablet Take 0.6 mg by mouth daily as needed (gout attacks).     docusate sodium (COLACE) 100 MG capsule Take 100 mg by mouth as needed for mild constipation.      DULoxetine (CYMBALTA) 30 MG capsule Take 30 mg by mouth daily.     ELIQUIS 5 MG TABS tablet TAKE 1 TABLET (5 MG TOTAL) BY MOUTH 2 (TWO) TIMES DAILY. START WITH EVENING DOSE 7/19 60 tablet 0   furosemide (LASIX) 40 MG tablet Take one half tablet (20mg ) daily 90 tablet 3   Magnesium 250 MG TABS Take 250 mg by mouth daily.     metoprolol succinate (TOPROL-XL) 100 MG 24 hr tablet TAKE 1 TABLET BY MOUTH DAILY WITH OR IMMEDIATLY FOLLOWING A MEAL 30 tablet 5   pantoprazole (PROTONIX) 40 MG tablet Take 40 mg by mouth daily before breakfast.      potassium chloride SA (K-DUR) 20 MEQ tablet Take 0.5 tablets (10 mEq total) by mouth daily. 90 tablet 3   pyridOXINE (VITAMIN B-6) 100 MG tablet Take 1 tablet (100 mg total) by mouth daily.     No current facility-administered medications on file prior to visit.     Allergies:   Allergies  Allergen Reactions   Buspirone Hcl Other (See Comments)    REACTION: made her sleepy, ? swollen legs   Lisinopril Other (See Comments)    REACTION: dizziness   Prozac [Fluoxetine Hcl] Other (See Comments)    sleepy     Physical Exam  Vitals:   10/25/18 1501  BP: (!) 156/86  Pulse: 66  Temp: 97.7 F (36.5 C)  TempSrc: Oral  Weight: 235 lb 3.2 oz (106.7 kg)  Height: 5\' 7"  (1.702 m)   Body mass index is 36.84 kg/m. No exam data present  Depression screen Monica [name] 2/9 10/29/2018  Decreased Interest 0  Down, Depressed, Hopeless 3  PHQ - 2 Score 3  Altered sleeping 2  Tired, decreased energy 1  Change in appetite 1  Feeling bad or failure about yourself  0  Trouble concentrating 0  Moving slowly or fidgety/restless 1  Suicidal thoughts 0  PHQ-9 Score 8      General: Obese pleasant elderly female, seated, in no evident distress Head: head normocephalic and atraumatic.   Neck: supple with no carotid or supraclavicular bruits Cardiovascular: regular rate and rhythm, no murmurs Musculoskeletal: no deformity Skin:  no rash/petichiae Vascular:  Normal pulses all extremities   Neurologic Exam Mental Status: Awake and fully alert. Oriented to place and time. Recent and remote memory intact. Attention span, concentration and fund of knowledge appropriate. Mood and affect appropriate.  Cranial Nerves: Fundoscopic exam reveals sharp disc margins. Pupils equal, briskly reactive to light. Extraocular movements full without nystagmus. Visual fields full to confrontation. Hearing intact. Facial sensation intact. Face, tongue, palate moves normally and symmetrically.  Motor: Normal bulk and tone.  No evidence of residual weakness but difficulty with hip flexor testing due to pain Sensory.: intact to touch , pinprick , position and vibratory sensation.  Coordination: Rapid alternating movements normal in all extremities. Finger-to-nose performed accurately bilaterally.  Difficulty performing heel-to-shin due to pain Gait and Station: Arises from chair without difficulty. Stance is slightly hunched.  Gait demonstrates  waddling type gait but is able to ambulate without device Reflexes: 1+ and symmetric. Toes downgoing.     NIHSS  0 Modified Rankin  0    Diagnostic Data (Labs, Imaging, Testing)  MRI 09/02/2018 1. Multiple small foci early subacute ischemia within the left MCA territory without acute hemorrhage or mass effect. 2. Chronic microhemorrhages of the lower brainstem and left parietal lobe.  Ct Angio Head W Or Wo Contrast  Ct Angio Neck W Or Wo Contrast 09/01/2018 1. 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 2. Bilateral carotid bifurcation  atherosclerosis with less than 50% stenosis by NASCET criteria.  3. Aortic atherosclerosis (ICD10-I70.0).   Ct Head Code Stroke Wo Contrast 09/01/2018 1. No acute intracranial abnormality.  2. ASPECTS is 10.  Transthoracic Echocardiogram  1. The left ventricle has hyperdynamic systolic function, with an ejection fraction of >65%. The cavity size was normal. There is mildly increased left ventricular wall thickness. Left ventricular diastolic Doppler parameters are consistent with  pseudonormalization. 2. The right ventricle has normal systolic function. The cavity was normal. There is no increase in right ventricular wall thickness. Right ventricular systolic pressure could not be assessed. 3. Left atrial size was mild-moderately dilated. 4. The mitral valve is abnormal. There is moderate mitral annular calcification present. 5. No stenosis of the aortic valve. 6. Likely calcified, prominent Coumadin ridge (normal variant landmark between LA appendage and left upper pulmonary vein). There is a 0.8 x 1.4 cm calcified mass like lesion adherent to the lateral LA wall, in the location of the Coumadin ridge (left  lateral ridge). It is non mobile, and likely represents calcified normal variant structure. 7. No intracardiac thrombi or masses were visualized.  Dg Chest Port 1 View 09/01/2018 Endotracheal tube in satisfactory position. No acute infiltrate is noted.   EKG - SR rate 74 BPM. (See cardiology reading for complete details)    ASSESSMENT: Monica [name] is a 83 y.o. year old female presented to [facility] ED with sudden onset aphasia and right hemiparesis on  09/01/2018 with stroke work-up revealing left MCA punctate small infarct due to left M1 occlusion status post TPA and IR with TICI 3 reperfusion embolic pattern secondary to bilateral DVT and new onset atrial flutter. Vascular risk factors include new onset atrial flutter, bilateral DVT, HTN, chronic diastolic HF, advanced age,  former tobacco use, EtOH use, obesity and OSA.  Recovered well from a stroke standpoint without residual deficits or reoccurring/new stroke/TIA symptoms.  Greatest concern is ongoing/worsening lower back pain with radiculopathy limiting mobility.    PLAN:  1. Left MCA infarcts: Continue Eliquis (apixaban) daily  for secondary stroke prevention. Maintain strict control of hypertension with blood pressure goal below 130/90, diabetes with hemoglobin A1c goal below 6.5% and cholesterol with LDL cholesterol (bad cholesterol) goal below 70 mg/dL.  I also advised the patient to eat a healthy diet with plenty of whole grains, cereals, fruits and vegetables, exercise regularly with at least 30 minutes of continuous activity daily and maintain ideal body weight. 2. HTN: Advised to continue current treatment regimen.  Advised to continue to monitor at home along with continued follow-up with PCP for management 3. Atrial flutter: Continue Eliquis along with ongoing follow-up with cardiology 4. Acute bilateral DVT: Continue treatment with Eliquis 5. Lower back pain: Advised to follow-up with her established orthopedic provider, Dr. Tamala Julian, for ongoing/worsening pain. Likely flareup due to prior right sided weakness post stroke. Initiate gabapentin 300mg  nightly for radiculopathy pain. Discussion regarding potential use of aqua therapy or restart PT for potential symptom treatment.   6. Reactive depression, poststroke: Discussion with patient regarding post stroke depression and use of antidepressants.  She is interested in initiating antidepressant treatment in the future but will initiate gabapentin first for pain as she feels as though a lot of her depression type symptoms are secondary to her pain.  Consider initiating Celexa if needed at follow-up visit or with PCP    Follow up in 2 months or call earlier if needed   Greater than 50% of time during this 45 minute visit was spent on counseling, explanation of  diagnosis of left MCA infarcts, reviewing risk factor management of HTN, atrial flutter, bilateral DVT, long discussion regarding flareup of lower back pain, planning of further management along with potential future management, and discussion with patient and family answering all questions.    Frann Rider, AGNP-BC  [facility] [address] [place], [place] [address]  Phone [phone] Fax [phone] Note: This document was prepared with digital dictation and possible smart phrase technology. Any transcriptional errors that result from this process are unintentional.

## 2018-10-25 NOTE — Patient Instructions (Signed)
We will start low-dose gabapentin 300 mg nightly for your nerve pain.  Additional information regarding medication listed below.  Please let me know next week if dosage needs to be increased  Consider restarting physical therapy and potentially doing aqua therapy for ongoing pain.  Okay to take Tylenol as needed for pain along with use of heat and ice  It is also highly recommended for you to obtain a bedside commode for safety concerns in the middle of the night.  If you continue to awaken frequently throughout the night, please speak with your cardiologist as you may need lower dose of Lasix  May consider use of antidepressant in the future if needed  Continue Eliquis (apixaban) daily  for secondary stroke prevention  Continue to follow with cardiology for atrial flutter and ongoing management of Eliquis along with monitoring of DVT  Continue to follow up with PCP regarding blood pressure management   Continue to monitor blood pressure at home  Maintain strict control of hypertension with blood pressure goal below 130/90, diabetes with hemoglobin A1c goal below 6.5% and cholesterol with LDL cholesterol (bad cholesterol) goal below 70 mg/dL. I also advised the patient to eat a healthy diet with plenty of whole grains, cereals, fruits and vegetables, exercise regularly and maintain ideal body weight.  Followup in the future with me in 2 months or call earlier if needed       Thank you for coming to see [place] at [facility]. I hope we have been able to provide you high quality care today.  You may receive a patient satisfaction survey over the next few weeks. We would appreciate your feedback and comments so that we may continue to improve ourselves and the health of our patients.   Gabapentin capsules or tablets What is this medicine? GABAPENTIN (GA ba pen tin) is used to control seizures in certain types of epilepsy. It is also used to treat certain types of nerve  pain. This medicine may be used for other purposes; ask your health care provider or pharmacist if you have questions. COMMON BRAND NAME(S): Active-PAC with Gabapentin, Gabarone, Neurontin What should I tell my health care provider before I take this medicine? They need to know if you have any of these conditions:  history of drug abuse or alcohol abuse problem  kidney disease  lung or breathing disease  suicidal thoughts, plans, or attempt; a previous suicide attempt by you or a family member  an unusual or allergic reaction to gabapentin, other medicines, foods, dyes, or preservatives  pregnant or trying to get pregnant  breast-feeding How should I use this medicine? Take this medicine by mouth with a glass of water. Follow the directions on the prescription label. You can take it with or without food. If it upsets your stomach, take it with food. Take your medicine at regular intervals. Do not take it more often than directed. Do not stop taking except on your doctor's advice. If you are directed to break the 600 or 800 mg tablets in half as part of your dose, the extra half tablet should be used for the next dose. If you have not used the extra half tablet within 28 days, it should be thrown away. A special MedGuide will be given to you by the pharmacist with each prescription and refill. Be sure to read this information carefully each time. Talk to your pediatrician regarding the use of this medicine in children. While this drug may be prescribed for children  as young as 3 years for selected conditions, precautions do apply. Overdosage: If you think you have taken too much of this medicine contact a poison control center or emergency room at once. NOTE: This medicine is only for you. Do not share this medicine with others. What if I miss a dose? If you miss a dose, take it as soon as you can. If it is almost time for your next dose, take only that dose. Do not take double or extra  doses. What may interact with this medicine? This medicine may interact with the following medications:  alcohol  antihistamines for allergy, cough, and cold  certain medicines for anxiety or sleep  certain medicines for depression like amitriptyline, fluoxetine, sertraline  certain medicines for seizures like phenobarbital, primidone  certain medicines for stomach problems  general anesthetics like halothane, isoflurane, methoxyflurane, propofol  local anesthetics like lidocaine, pramoxine, tetracaine  medicines that relax muscles for surgery  narcotic medicines for pain  phenothiazines like chlorpromazine, mesoridazine, prochlorperazine, thioridazine This list may not describe all possible interactions. Give your health care provider a list of all the medicines, herbs, non-prescription drugs, or dietary supplements you use. Also tell them if you smoke, drink alcohol, or use illegal drugs. Some items may interact with your medicine. What should I watch for while using this medicine? Visit your doctor or health care provider for regular checks on your progress. You may want to keep a record at home of how you feel your condition is responding to treatment. You may want to share this information with your doctor or health care provider at each visit. You should contact your doctor or health care provider if your seizures get worse or if you have any new types of seizures. Do not stop taking this medicine or any of your seizure medicines unless instructed by your doctor or health care provider. Stopping your medicine suddenly can increase your seizures or their severity. This medicine may cause serious skin reactions. They can happen weeks to months after starting the medicine. Contact your health care provider right away if you notice fevers or flu-like symptoms with a rash. The rash may be red or purple and then turn into blisters or peeling of the skin. Or, you might notice a red rash  with swelling of the face, lips or lymph nodes in your neck or under your arms. Wear a medical identification bracelet or chain if you are taking this medicine for seizures, and carry a card that lists all your medications. You may get drowsy, dizzy, or have blurred vision. Do not drive, use machinery, or do anything that needs mental alertness until you know how this medicine affects you. To reduce dizzy or fainting spells, do not sit or stand up quickly, especially if you are an older patient. Alcohol can increase drowsiness and dizziness. Avoid alcoholic drinks. Your mouth may get dry. Chewing sugarless gum or sucking hard candy, and drinking plenty of water will help. The use of this medicine may increase the chance of suicidal thoughts or actions. Pay special attention to how you are responding while on this medicine. Any worsening of mood, or thoughts of suicide or dying should be reported to your health care provider right away. Women who become pregnant while using this medicine may enroll in the [place] Pregnancy Registry by calling [phone]. This registry collects information about the safety of antiepileptic drug use during pregnancy. What side effects may I notice from receiving this medicine? Side effects  that you should report to your doctor or health care professional as soon as possible:  allergic reactions like skin rash, itching or hives, swelling of the face, lips, or tongue  breathing problems  rash, fever, and swollen lymph nodes  redness, blistering, peeling or loosening of the skin, including inside the mouth  suicidal thoughts, mood changes Side effects that usually do not require medical attention (report to your doctor or health care professional if they continue or are bothersome):  dizziness  drowsiness  headache  nausea, vomiting  swelling of ankles, feet, hands  tiredness This list may not describe all possible side effects.  Call your doctor for medical advice about side effects. You may report side effects to FDA at 1-800-FDA-1088. Where should I keep my medicine? Keep out of reach of children. This medicine may cause accidental overdose and death if it taken by other adults, children, or pets. Mix any unused medicine with a substance like cat litter or coffee grounds. Then throw the medicine away in a sealed container like a sealed bag or a coffee can with a lid. Do not use the medicine after the expiration date. Store at room temperature between 15 and 30 degrees C (59 and 86 degrees F). NOTE: This sheet is a summary. It may not cover all possible information. If you have questions about this medicine, talk to your doctor, pharmacist, or health care provider.  2020 Elsevier/Gold Standard (2018-05-11 14:16:43)

## 2018-10-29 ENCOUNTER — Encounter: Payer: Self-pay | Admitting: Adult Health

## 2018-10-31 NOTE — Progress Notes (Signed)
I agree with the above plan 

## 2018-11-01 ENCOUNTER — Encounter: Payer: Self-pay | Admitting: Adult Health

## 2018-11-01 ENCOUNTER — Other Ambulatory Visit: Payer: Self-pay | Admitting: Cardiology

## 2018-11-01 MED ORDER — GABAPENTIN 300 MG PO CAPS: 300.0000 mg | ORAL_CAPSULE | Freq: Two times a day (BID) | ORAL | 0 refills | Status: DC

## 2018-11-01 NOTE — Telephone Encounter (Signed)
Refill request

## 2018-11-01 NOTE — Telephone Encounter (Signed)
71f 106.7kg Scr 1.54 10/17/18 Lovw/gerhardt 10/17/18 Requests 5mg  but only qualifies for a 2.5mg  needs pharmd review

## 2018-11-07 ENCOUNTER — Encounter: Payer: Self-pay | Admitting: Adult Health

## 2018-11-12 ENCOUNTER — Encounter: Payer: Self-pay | Admitting: Adult Health

## 2018-11-12 DIAGNOSIS — R2689 Other abnormalities of gait and mobility: Secondary | ICD-10-CM

## 2018-11-13 ENCOUNTER — Encounter: Payer: Self-pay | Admitting: Adult Health

## 2018-11-13 DIAGNOSIS — M5416 Radiculopathy, lumbar region: Secondary | ICD-10-CM

## 2018-11-13 DIAGNOSIS — R2689 Other abnormalities of gait and mobility: Secondary | ICD-10-CM

## 2018-11-13 DIAGNOSIS — M48062 Spinal stenosis, lumbar region with neurogenic claudication: Secondary | ICD-10-CM

## 2018-11-20 ENCOUNTER — Other Ambulatory Visit: Payer: Self-pay | Admitting: Neurosurgery

## 2018-11-20 DIAGNOSIS — M4316 Spondylolisthesis, lumbar region: Secondary | ICD-10-CM

## 2018-11-27 ENCOUNTER — Encounter: Payer: Self-pay | Admitting: Adult Health

## 2018-11-29 ENCOUNTER — Telehealth: Payer: Self-pay

## 2018-11-29 NOTE — Telephone Encounter (Signed)
Copied from [place] [id number]. Topic: Appointment Scheduling - New Patient >> Nov 28, 2018  5:29 PM Rutherford Nail, [place] wrote: New patient has been scheduled for your office. Provider: Dr Aundra Dubin Date of Appointment: 12/26/2018 at 11:00am  Route to department's PEC pool.

## 2018-11-30 ENCOUNTER — Other Ambulatory Visit: Payer: Self-pay | Admitting: Cardiology

## 2018-11-30 NOTE — Telephone Encounter (Signed)
Refill Request.  

## 2018-11-30 NOTE — Telephone Encounter (Signed)
Age 83, weight 107kg, SCr 1.54 on 10/17/18, however this was abnormal for pt. SCr 1.04 the month prior and consistently < 1.5 previously. Will refill for 5mg  BID dose. Has repeat BMET scheduled for next week.

## 2018-12-02 ENCOUNTER — Ambulatory Visit
Admission: RE | Admit: 2018-12-02 | Discharge: 2018-12-02 | Disposition: A | Discharge status: Home / Self Care | Payer: Medicare Other | Source: Ambulatory Visit | Attending: Neurosurgery | Admitting: Neurosurgery

## 2018-12-02 ENCOUNTER — Other Ambulatory Visit: Payer: Self-pay

## 2018-12-02 DIAGNOSIS — M4316 Spondylolisthesis, lumbar region: Secondary | ICD-10-CM | POA: Diagnosis present
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
## 2018-12-04 ENCOUNTER — Encounter: Payer: Self-pay | Admitting: Interventional Cardiology

## 2018-12-04 ENCOUNTER — Other Ambulatory Visit: Payer: Self-pay

## 2018-12-04 ENCOUNTER — Ambulatory Visit: Payer: Medicare Other | Admitting: Interventional Cardiology

## 2018-12-04 VITALS — BP 215/111 | HR 70 | Ht 67.0 in | Wt 237.8 lb

## 2018-12-04 DIAGNOSIS — I10 Essential (primary) hypertension: Secondary | ICD-10-CM

## 2018-12-04 DIAGNOSIS — Z7901 Long term (current) use of anticoagulants: Secondary | ICD-10-CM

## 2018-12-04 DIAGNOSIS — I110 Hypertensive heart disease with heart failure: Secondary | ICD-10-CM | POA: Diagnosis not present

## 2018-12-04 DIAGNOSIS — Z8679 Personal history of other diseases of the circulatory system: Secondary | ICD-10-CM | POA: Diagnosis not present

## 2018-12-04 DIAGNOSIS — E7849 Other hyperlipidemia: Secondary | ICD-10-CM

## 2018-12-04 DIAGNOSIS — Z7189 Other specified counseling: Secondary | ICD-10-CM

## 2018-12-04 DIAGNOSIS — I5032 Chronic diastolic (congestive) heart failure: Secondary | ICD-10-CM

## 2018-12-04 DIAGNOSIS — Z79899 Other long term (current) drug therapy: Secondary | ICD-10-CM

## 2018-12-04 MED ORDER — FUROSEMIDE 40 MG PO TABS: 40.0000 mg | ORAL_TABLET | Freq: Every day | ORAL | 3 refills | Status: DC

## 2018-12-04 MED ORDER — ALPRAZOLAM 0.25 MG PO TABS: 0.2500 mg | ORAL_TABLET | Freq: Two times a day (BID) | ORAL | 0 refills | Status: DC | PRN

## 2018-12-04 MED ORDER — AMLODIPINE BESYLATE 5 MG PO TABS: 5.0000 mg | ORAL_TABLET | Freq: Every day | ORAL | 3 refills | Status: DC

## 2018-12-04 MED ORDER — POTASSIUM CHLORIDE CRYS ER 20 MEQ PO TBCR: 20.0000 meq | EXTENDED_RELEASE_TABLET | Freq: Every day | ORAL | 3 refills | Status: DC

## 2018-12-04 NOTE — Patient Instructions (Signed)
Medication Instructions:  1) INCREASE Potassium Chloride to 71meq once daily. 2) INCREASE Furosemide to 40mg  once daily 3) START Amlodipine 5mg  once daily 4) You may take Xanax 0.25mg  when you have anxiety.  Do not take more than 2 tablets daily.  Further refills will need to come from your Primary Care Physician.  If you need a refill on your cardiac medications before your next appointment, please call your pharmacy.   Lab work: Your physician recommends that you return for lab work in: 7-10 days (BMET, Liver).  If you have labs (blood work) drawn today and your tests are completely normal, you will receive your results only by: Marland Kitchen MyChart Message (if you have MyChart) OR . A paper copy in the mail If you have any lab test that is abnormal or we need to change your treatment, we will call you to review the results.  Testing/Procedures: None  Follow-Up: At [facility], you and your health needs are our priority.  As part of our continuing mission to provide you with exceptional heart care, we have created designated Provider Care Teams.  These Care Teams include your primary Cardiologist (physician) and Advanced Practice Providers (APPs -  Physician Assistants and Nurse Practitioners) who all work together to provide you with the care you need, when you need it. You will need a follow up appointment in 2-3 weeks after monitor is completed.  Please call our office 2 months in advance to schedule this appointment.  You may see Sinclair Grooms, MD or one of the following Advanced Practice Providers on your designated Care Team:   Truitt Merle, NP Cecilie Kicks, NP . Kathyrn Drown, NP  Any Other Special Instructions Will Be Listed Below (If Applicable).

## 2018-12-05 ENCOUNTER — Other Ambulatory Visit: Payer: Self-pay

## 2018-12-05 NOTE — Patient Outreach (Signed)
First attempt to obtain mRs. Patient was indisposed. Will try again another day.

## 2018-12-12 ENCOUNTER — Other Ambulatory Visit: Payer: Self-pay

## 2018-12-12 NOTE — Telephone Encounter (Signed)
This encounter was created in error - please disregard.

## 2018-12-12 NOTE — Patient Outreach (Signed)
Telephone outreach to patient to obtain mRs was successfully completed. mRs= 2. 

## 2018-12-13 ENCOUNTER — Other Ambulatory Visit: Payer: Self-pay

## 2018-12-13 ENCOUNTER — Other Ambulatory Visit: Payer: Medicare Other | Admitting: *Deleted

## 2018-12-13 DIAGNOSIS — I5032 Chronic diastolic (congestive) heart failure: Secondary | ICD-10-CM

## 2018-12-13 DIAGNOSIS — I110 Hypertensive heart disease with heart failure: Secondary | ICD-10-CM

## 2018-12-13 LAB — BASIC METABOLIC PANEL
BUN/Creatinine Ratio: 15 (ref 12–28)
BUN: 15 mg/dL (ref 8–27)
CO2: 24 mmol/L (ref 20–29)
Calcium: 9.2 mg/dL (ref 8.7–10.3)
Chloride: 104 mmol/L (ref 96–106)
Creatinine, Ser: 1.03 mg/dL — ABNORMAL HIGH (ref 0.57–1.00)
GFR calc Af Amer: 59 mL/min/{1.73_m2} — ABNORMAL LOW (ref >59)
GFR calc non Af Amer: 51 mL/min/{1.73_m2} — ABNORMAL LOW (ref >59)
Glucose: 88 mg/dL (ref 65–99)
Potassium: 3.6 mmol/L (ref 3.5–5.2)
Sodium: 143 mmol/L (ref 134–144)

## 2018-12-13 LAB — HEPATIC FUNCTION PANEL
ALT: 13 IU/L (ref 0–32)
AST: 22 IU/L (ref 0–40)
Albumin: 4.1 g/dL (ref 3.6–4.6)
Alkaline Phosphatase: 91 IU/L (ref 39–117)
Bilirubin Total: 0.4 mg/dL (ref 0.0–1.2)
Bilirubin, Direct: 0.12 mg/dL (ref 0.00–0.40)
Total Protein: 6.7 g/dL (ref 6.0–8.5)

## 2018-12-24 ENCOUNTER — Ambulatory Visit: Payer: Medicare Other

## 2018-12-25 ENCOUNTER — Other Ambulatory Visit: Payer: Self-pay

## 2018-12-26 ENCOUNTER — Encounter: Payer: Self-pay | Admitting: Internal Medicine

## 2018-12-26 ENCOUNTER — Ambulatory Visit: Payer: Medicare Other | Admitting: Adult Health

## 2018-12-26 ENCOUNTER — Ambulatory Visit: Payer: Medicare Other | Admitting: Internal Medicine

## 2018-12-26 ENCOUNTER — Encounter: Payer: Self-pay | Admitting: Adult Health

## 2018-12-26 VITALS — BP 130/78 | HR 71 | Temp 98.1°F | Ht 67.0 in | Wt 236.8 lb

## 2018-12-26 VITALS — BP 144/75 | HR 84 | Temp 98.0°F | Ht 67.0 in | Wt 239.6 lb

## 2018-12-26 DIAGNOSIS — D509 Iron deficiency anemia, unspecified: Secondary | ICD-10-CM

## 2018-12-26 DIAGNOSIS — I10 Essential (primary) hypertension: Secondary | ICD-10-CM | POA: Diagnosis not present

## 2018-12-26 DIAGNOSIS — M545 Low back pain, unspecified: Secondary | ICD-10-CM

## 2018-12-26 DIAGNOSIS — E559 Vitamin D deficiency, unspecified: Secondary | ICD-10-CM

## 2018-12-26 DIAGNOSIS — M4800 Spinal stenosis, site unspecified: Secondary | ICD-10-CM

## 2018-12-26 DIAGNOSIS — G8929 Other chronic pain: Secondary | ICD-10-CM | POA: Insufficient documentation

## 2018-12-26 DIAGNOSIS — Z13220 Encounter for screening for lipoid disorders: Secondary | ICD-10-CM | POA: Diagnosis not present

## 2018-12-26 DIAGNOSIS — D1803 Hemangioma of intra-abdominal structures: Secondary | ICD-10-CM

## 2018-12-26 DIAGNOSIS — I82403 Acute embolism and thrombosis of unspecified deep veins of lower extremity, bilateral: Secondary | ICD-10-CM

## 2018-12-26 DIAGNOSIS — I63412 Cerebral infarction due to embolism of left middle cerebral artery: Secondary | ICD-10-CM

## 2018-12-26 DIAGNOSIS — I4892 Unspecified atrial flutter: Secondary | ICD-10-CM

## 2018-12-26 DIAGNOSIS — E785 Hyperlipidemia, unspecified: Secondary | ICD-10-CM

## 2018-12-26 DIAGNOSIS — Z1389 Encounter for screening for other disorder: Secondary | ICD-10-CM

## 2018-12-26 DIAGNOSIS — R300 Dysuria: Secondary | ICD-10-CM

## 2018-12-26 DIAGNOSIS — R102 Pelvic and perineal pain: Secondary | ICD-10-CM

## 2018-12-26 DIAGNOSIS — M549 Dorsalgia, unspecified: Secondary | ICD-10-CM | POA: Insufficient documentation

## 2018-12-26 DIAGNOSIS — D649 Anemia, unspecified: Secondary | ICD-10-CM | POA: Diagnosis not present

## 2018-12-26 DIAGNOSIS — M48062 Spinal stenosis, lumbar region with neurogenic claudication: Secondary | ICD-10-CM

## 2018-12-26 DIAGNOSIS — R1084 Generalized abdominal pain: Secondary | ICD-10-CM

## 2018-12-26 DIAGNOSIS — M5416 Radiculopathy, lumbar region: Secondary | ICD-10-CM

## 2018-12-26 DIAGNOSIS — Z1231 Encounter for screening mammogram for malignant neoplasm of breast: Secondary | ICD-10-CM

## 2018-12-26 DIAGNOSIS — I639 Cerebral infarction, unspecified: Secondary | ICD-10-CM

## 2018-12-26 HISTORY — DX: Acute embolism and thrombosis of unspecified deep veins of lower extremity, bilateral: I82.403

## 2018-12-26 HISTORY — DX: Other chronic pain: G89.29

## 2018-12-26 LAB — LIPID PANEL
Cholesterol: 194 mg/dL (ref 0–200)
HDL: 47.3 mg/dL (ref >39.00)
LDL Cholesterol: 132 mg/dL — ABNORMAL HIGH (ref 0–99)
NonHDL: 147.11
Total CHOL/HDL Ratio: 4
Triglycerides: 74 mg/dL (ref 0.0–149.0)
VLDL: 14.8 mg/dL (ref 0.0–40.0)

## 2018-12-26 LAB — VITAMIN D 25 HYDROXY (VIT D DEFICIENCY, FRACTURES): VITD: 26.55 ng/mL — ABNORMAL LOW (ref 30.00–100.00)

## 2018-12-26 MED ORDER — GABAPENTIN 300 MG PO CAPS: 300.0000 mg | ORAL_CAPSULE | Freq: Three times a day (TID) | ORAL | 3 refills | Status: DC

## 2018-12-26 NOTE — Patient Instructions (Signed)
Continue Eliquis (apixaban) daily for secondary stroke prevention  Continue to follow up with PCP regarding cholesterol and blood pressure monitoring/management   Continue to follow with cardiology for atrial fibrillation and Eliquis management  Ongoing use of gabapentin 300 mg 3 times daily for nerve pain  Consider participating in aquatic therapy  Continue to monitor blood pressure at home  Maintain strict control of hypertension with blood pressure goal below 130/90, diabetes with hemoglobin A1c goal below 6.5% and cholesterol with LDL cholesterol (bad cholesterol) goal below 70 mg/dL. I also advised the patient to eat a healthy diet with plenty of whole grains, cereals, fruits and vegetables, exercise regularly and maintain ideal body weight.  Followup in the future with me in 6 months or call earlier if needed       Thank you for coming to see [place] at [facility]. I hope we have been able to provide you high quality care today.  You may receive a patient satisfaction survey over the next few weeks. We would appreciate your feedback and comments so that we may continue to improve ourselves and the health of our patients.

## 2018-12-26 NOTE — Patient Instructions (Addendum)
Nature made multivitamin with iron for women Centrum silver with iron for women   CT abdomen pelvis someone will call you about this  Refer to Dr. Garwin Brothers ob/gyn [facility]  Refer Dr. Tasia Catchings blood specialist in [place]    Iron Deficiency Anemia, Adult Iron deficiency anemia is a condition in which the concentration of red blood cells or hemoglobin in the blood is below normal because of too little iron. Hemoglobin is a substance in red blood cells that carries oxygen to the body's tissues. When the concentration of red blood cells or hemoglobin is too low, not enough oxygen reaches these tissues. Iron deficiency anemia is usually long-lasting (chronic) and it develops over time. It may or may not cause symptoms. It is a common type of anemia. What are the causes? This condition may be caused by:  Not enough iron in the diet.  Blood loss caused by bleeding in the intestine.  Blood loss from a gastrointestinal condition like Crohn disease.  Frequent blood draws, such as from blood donation.  Abnormal absorption in the gut.  Heavy menstrual periods in women.  Cancers of the gastrointestinal system, such as colon cancer. What are the signs or symptoms? Symptoms of this condition may include:  Fatigue.  Headache.  Pale skin, lips, and nail beds.  Poor appetite.  Weakness.  Shortness of breath.  Dizziness.  Cold hands and feet.  Fast or irregular heartbeat.  Irritability. This is more common in severe anemia.  Rapid breathing. This is more common in severe anemia. Mild anemia may not cause any symptoms. How is this diagnosed? This condition is diagnosed based on:  Your medical history.  A physical exam.  Blood tests. You may have additional tests to find the underlying cause of your anemia, such as:  Testing for blood in the stool (fecal occult blood test).  A procedure to see inside your colon and rectum (colonoscopy).  A procedure to see inside your  esophagus and stomach (endoscopy).  A test in which cells are removed from bone marrow (bone marrow aspiration) or fluid is removed from the bone marrow to be examined (biopsy). This is rarely needed. How is this treated? This condition is treated by correcting the cause of your iron deficiency. Treatment may involve:  Adding iron-rich foods to your diet.  Taking iron supplements. If you are pregnant or breastfeeding, you may need to take extra iron because your normal diet usually does not provide the amount of iron that you need.  Increasing vitamin C intake. Vitamin C helps your body absorb iron. Your health care provider may recommend that you take iron supplements along with a glass of orange juice or a vitamin C supplement.  Medicines to make heavy menstrual flow lighter.  Surgery. You may need repeat blood tests to determine whether treatment is working. Depending on the underlying cause, the anemia should be corrected within 2 months of starting treatment. If the treatment does not seem to be working, you may need more testing. Follow these instructions at home: Medicines  Take over-the-counter and prescription medicines only as told by your health care provider. This includes iron supplements and vitamins.  If you cannot tolerate taking iron supplements by mouth, talk with your health care provider about taking them through a vein (intravenously) or an injection into a muscle.  For the best iron absorption, you should take iron supplements when your stomach is empty. If you cannot tolerate them on an empty stomach, you may need to take  them with food.  Do not drink milk or take antacids at the same time as your iron supplements. Milk and antacids may interfere with iron absorption.  Iron supplements can cause constipation. To prevent constipation, include fiber in your diet as told by your health care provider. A stool softener may also be recommended. Eating and  drinking   Talk with your health care provider before changing your diet. He or she may recommend that you eat foods that contain a lot of iron, such as: ? Liver. ? Low-fat (lean) beef. ? Breads and cereals that have iron added to them (are fortified). ? Eggs. ? Dried fruit. ? Dark green, leafy vegetables.  To help your body use the iron from iron-rich foods, eat those foods at the same time as fresh fruits and vegetables that are high in vitamin C. Foods that are high in vitamin C include: ? Oranges. ? Peppers. ? Tomatoes. ? Mangoes.  Drinkenoughfluid to keep your urine clear or pale yellow. General instructions  Return to your normal activities as told by your health care provider. Ask your health care provider what activities are safe for you.  Practice good hygiene. Anemia can make you more prone to illness and infection.  Keep all follow-up visits as told by your health care provider. This is important. Contact a health care provider if:  You feel nauseous or you vomit.  You feel weak.  You have unexplained sweating.  You develop symptoms of constipation, such as: ? Having fewer than three bowel movements a week. ? Straining to have a bowel movement. ? Having stools that are hard, dry, or larger than normal. ? Feeling full or bloated. ? Pain in the lower abdomen. ? Not feeling relief after having a bowel movement. Get help right away if:  You faint. If this happens, do not drive yourself to the hospital. Call your local emergency services (911 in the [place]  You have chest pain.  You have shortness of breath that: ? Is severe. ? Gets worse with physical activity.  You have a rapid heartbeat.  You become light-headed when getting up from a sitting or lying down position. This information is not intended to replace advice given to you by your health care provider. Make sure you discuss any questions you have with your health care provider. Document Released:  02/05/2000 Document Revised: 01/20/2017 Document Reviewed: 10/28/2015 Elsevier Patient Education  2020 [employer].

## 2018-12-26 NOTE — Progress Notes (Signed)
[facility] [address] [place]. [place] [address] [phone]       HOSPITAL FOLLOW UP NOTE  [name] Date of Birth:  08/09/1935 Medical Record Number:  [id number]   Reason for Referral: Left MCA stroke 08/2018    CHIEF COMPLAINT:  Chief Complaint  Patient presents with   Follow-up    Room 9, alone.  No concerns. Wants to drive.    HPI:  Stroke admission 09/01/2018: [name] a 83 y.o.femalewith history of hypertension, hyperlipidemia, obesity, remote GI bleeding who presented to [facility] ED on 09/01/2018 with sudden onset aphasia and right hemiparesis.  Neurology consulted with stroke work-up revealing left MCA punctate small infarct due to left M1 occlusion status post TPA and IR with TICI 3 reperfusion embolic pattern in setting of bilateral DVT and new onset A. Fib.  She received TPA without complication.  MRI showed left MCA infarcts.  CTA head/neck showed occlusion of distal M1 with intermediate collateralization, bilateral ICA bulb arthrosclerosis and right VA origin stenosis.  2D echo normal EF.  Lower extremity venous Doppler showed right PTV, left popliteal and bilateral peroneal vein acute DVT.  Eliquis initiated for treatment of DVT and new onset atrial fibrillation.  No evidence of HLD with LDL 66 or DM with A1c 6.2.  HTN stable.  Due to atrial fibrillation/atrial flutter with RVR, amiodarone drip initiated with conversion to NSR and recommended continuation of amiodarone with tapering schedule for 6 to 12 weeks and follow-up with cardiology outpatient.  Other stroke risk factors include advanced age, former tobacco use, EtOH use, obesity, family history of stroke and OSA.  Discharged home in stable condition recommendation home health therapies.  Initial visit 10/25/2018: [name] is being seen today for hospital follow-up and is accompanied by her daughter.  She has recovered well from a stroke standpoint without residual deficits.   She has since completed home health therapies.  She continues on Eliquis without bleeding or bruising for atrial flutter and secondary stroke prevention along with treatment of bilateral DVT.  She continues on amiodarone taper and plans on doing 30-day cardiac event monitor after 11/2 (amioderone will be completed at that time).  She was initially staying with her daughter at hospital discharge but has since returned home.  She has been having difficulty with bilateral lower back pain with radiculopathy R>L leg pain.  Pain consists of stabbing sensation which is worsened with ambulation and progressively worsening.  She previously was being seen by Ortho with injections.  She had trialed gabapentin in the past which she tolerated well but would only use intermittently.  Prior MRI lumbar spine and 08/2017 showed multiple levels of spinal stenosis and degenerative changes.  Initially upon hospital discharge, she continued to have right-sided weakness therefore relying heavily on left side for ambulation.  She has not contacted her establish orthopedic provider, Dr. Tamala Julian, in regards to her worsening back pain.  She has also been experiencing increased anxiety and depression since returning home which she believes is more due to her constant pain which has been limiting mobility and functioning.  No further concerns at this time.  Denies new or worsening stroke/TIA symptoms.  Update 12/26/2018: [name] is an 83 year old female who is being seen today for stroke follow-up.  She has been stable from a stroke standpoint without new or reoccurring stroke/TIA symptoms.  Continues on Eliquis without bleeding or bruising.  Ongoing follow-up with cardiology regularly.  At prior visit, patient's main concern was low back pain  with radiculopathy limiting functional ability.  Initiated gabapentin at prior visit and recommended follow-up with established orthopedic provider.  Continue to experience pain despite gabapentin.   Due to ongoing pain, she requested referral to neurosurgery for further evaluation and potential treatment options.  She states her only option was back surgery which she is not interested at this time due to recent stroke and anticoagulation.  She continues to take gabapentin and does endorse improvement and benefit with ongoing use.  Denies side effects.  Due to decreased pain with gabapentin, she has slowly increased activity and daily functioning.  Continues to use a cane for ambulation and denies any recent falls.  Continues to live independently but daughter will check on her frequently along with other family members.  She endorses improvement of her overall mood.  Cardiology started her on Xanax 0.25 mg daily as needed for anxiety but only had to take 1 time and has not had any recurrent anxiety symptoms.  Recently initiated care with new PCP Dr. Terese Door.  She is questioning potential return to driving.  No further concerns at this time.    ROS:   14 system review of systems performed and negative with exception of pain, numbness/tingling  PMH:  Past Medical History:  Diagnosis Date   Allergic rhinitis    Anxiety    Benign neoplasm of breast 2013   Cardiomegaly    Compulsive overeating    Depression    Diffuse cystic mastopathy 2013   Edema    GI bleeding    Gout    HTN (hypertension)    Hyperlipidemia    Lump or mass in breast 2012   OA (osteoarthritis) of knee    with injections   Obesity    Other abnormal glucose    Personal history of tobacco use, presenting hazards to health    Sciatica    Sinus infection 2010   Unspecified sleep apnea     PSH:  Past Surgical History:  Procedure Laterality Date   adenosine cardiolite  1/08   low risk    APPENDECTOMY     BREAST LUMPECTOMY     right x1 ('89) left x2 ('70s, '90)   CARDIAC CATHETERIZATION  2001   normal per pt   COLONOSCOPY  11/02   diverticulosis   COLONOSCOPY  9/06    diverticulosis, hemorhoids   COLONOSCOPY N/A 10/01/2016   Procedure: COLONOSCOPY;  Surgeon: Gatha Mayer, MD;  Location: [facility];  Service: Endoscopy;  Laterality: N/A;   dexa  3/02   normal   EYE SURGERY  2012   cataract   IR CT HEAD LTD  09/01/2018   IR PERCUTANEOUS ART THROMBECTOMY/INFUSION INTRACRANIAL INC DIAG ANGIO  09/01/2018   knee replacement Right 9/11   R. Dr. Telford Nab   RADIOLOGY WITH ANESTHESIA N/A 09/01/2018   Procedure: IR WITH ANESTHESIA;  Surgeon: Luanne Bras, MD;  Location: [facility];  Service: Radiology;  Laterality: N/A;   sleep study  5/05   TONSILLECTOMY     TOTAL ABDOMINAL HYSTERECTOMY     fibroids, age of 43   TOTAL KNEE ARTHROPLASTY Left 2012   US TRANSVAGINAL PELVIC MODIFIED  2000, 2001    Social History:  Social History   Socioeconomic History   Marital status: Widowed    Spouse name: Not on file   Number of children: 1   Years of education: Not on file   Highest education level: Not on file  Occupational History   Occupation: Retired school principal  Employer: RETIRED  Social Designer, fashion/clothing strain: Not on file   Food insecurity    Worry: Not on file    Inability: Not on file   Transportation needs    Medical: Not on file    Non-medical: Not on file  Tobacco Use   Smoking status: Former Smoker    Packs/day: 0.50    Years: 20.00    Pack years: 10.00    Types: Cigarettes   Smokeless tobacco: Never Used   Tobacco comment: quit approx. 20 years ago  Substance and Sexual Activity   Alcohol use: Yes    Alcohol/week: 0.0 standard drinks    Comment: wine occasional   Drug use: No   Sexual activity: Never  Lifestyle   Physical activity    Days per week: Not on file    Minutes per session: Not on file   Stress: Not on file  Relationships   Social connections    Talks on phone: Not on file    Gets together: Not on file    Attends religious service: Not on file    Active member of club or  organization: Not on file    Attends meetings of clubs or organizations: Not on file    Relationship status: Not on file   Intimate partner violence    Fear of current or ex partner: Not on file    Emotionally abused: Not on file    Physically abused: Not on file    Forced sexual activity: Not on file  Other Topics Concern   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           Family History:  Family History  Problem Relation Age of Onset   Stroke Father    Hypertension Mother        (a lot of animosity in their relationship)   Heart failure Mother    Gout Other        whole family    Prostate cancer Brother    Cancer Brother        prostate    Hepatitis Brother        Hep C; alcoholism (terminal)   Drug abuse Brother        died 27   Breast cancer Sister    Cancer Sister        breast cancer   Other Brother        drug addiction    Medications:   Current Outpatient Medications on File Prior to Visit  Medication Sig Dispense Refill   allopurinol (ZYLOPRIM) 100 MG tablet TAKE 2 TABLETS BY MOUTH ONCE A DAY *NEED OFFICE VISIT 60 tablet 0   ALPRAZolam (XANAX) 0.25 MG tablet Take 1 tablet (0.25 mg total) by mouth 2 (two) times daily as needed for anxiety. 25 tablet 0   amLODipine (NORVASC) 5 MG tablet Take 1 tablet (5 mg total) by mouth daily. 180 tablet 3   DULoxetine (CYMBALTA) 30 MG capsule Take 30 mg by mouth daily.     ELIQUIS 5 MG TABS tablet TAKE 1 TABLET BY MOUTH TWICE A DAY 60 tablet 0   furosemide (LASIX) 40 MG tablet Take 1 tablet (40 mg total) by mouth daily. 90 tablet 3   Magnesium 250 MG TABS Take 250 mg by mouth daily.     metoprolol succinate (TOPROL-XL) 100 MG 24 hr tablet TAKE 1 TABLET  BY MOUTH DAILY WITH OR IMMEDIATLY FOLLOWING A MEAL 30 tablet 5   potassium chloride SA (KLOR-CON) 20 MEQ tablet Take 1 tablet (20 mEq total) by mouth daily. 90 tablet 3    No current facility-administered medications on file prior to visit.     Allergies:   Allergies  Allergen Reactions   Buspirone Hcl Other (See Comments)    REACTION: made her sleepy, ? swollen legs   Lisinopril Other (See Comments)    REACTION: dizziness   Prozac [Fluoxetine Hcl] Other (See Comments)    sleepy     Physical Exam  Vitals:   12/26/18 1411  BP: (!) 144/75  Pulse: 84  Temp: 98 F (36.7 C)  Weight: 239 lb 9.6 oz (108.7 kg)  Height: 5\' 7"  (1.702 m)   Body mass index is 37.53 kg/m. No exam data present  General: Obese pleasant elderly female, seated, in no evident distress Head: head normocephalic and atraumatic.   Neck: supple with no carotid or supraclavicular bruits Cardiovascular: regular rate and rhythm, no murmurs Musculoskeletal: no deformity Skin:  no rash/petichiae Vascular:  Normal pulses all extremities   Neurologic Exam Mental Status: Awake and fully alert. Oriented to place and time. Recent and remote memory intact. Attention span, concentration and fund of knowledge appropriate. Mood and affect appropriate.  Cranial Nerves:  Pupils equal, briskly reactive to light. Extraocular movements full without nystagmus. Visual fields full to confrontation. Hearing intact. Facial sensation intact. Face, tongue, palate moves normally and symmetrically.  Motor: Normal bulk and tone.  Normal weakness throughout all tested extremities Sensory.: intact to touch , pinprick , position and vibratory sensation.  Coordination: Rapid alternating movements normal in all extremities. Finger-to-nose performed accurately bilaterally.   Gait and Station: Arises from chair without difficulty. Stance is slightly hunched.  Gait demonstrates  waddling type gait with occasional use of cane Reflexes: 1+ and symmetric. Toes downgoing.        Diagnostic Data (Labs, Imaging, Testing)  MRI 09/02/2018 1. Multiple small foci early subacute ischemia within the left MCA  territory without acute hemorrhage or mass effect. 2. Chronic microhemorrhages of the lower brainstem and left parietal lobe.  Ct Angio Head W Or Wo Contrast  Ct Angio Neck W Or Wo Contrast 09/01/2018 1. 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 2. Bilateral carotid bifurcation atherosclerosis with less than 50% stenosis by NASCET criteria.  3. Aortic atherosclerosis (ICD10-I70.0).   Ct Head Code Stroke Wo Contrast 09/01/2018 1. No acute intracranial abnormality.  2. ASPECTS is 10.  Transthoracic Echocardiogram  1. The left ventricle has hyperdynamic systolic function, with an ejection fraction of >65%. The cavity size was normal. There is mildly increased left ventricular wall thickness. Left ventricular diastolic Doppler parameters are consistent with  pseudonormalization. 2. The right ventricle has normal systolic function. The cavity was normal. There is no increase in right ventricular wall thickness. Right ventricular systolic pressure could not be assessed. 3. Left atrial size was mild-moderately dilated. 4. The mitral valve is abnormal. There is moderate mitral annular calcification present. 5. No stenosis of the aortic valve. 6. Likely calcified, prominent Coumadin ridge (normal variant landmark between LA appendage and left upper pulmonary vein). There is a 0.8 x 1.4 cm calcified mass like lesion adherent to the lateral LA wall, in the location of the Coumadin ridge (left  lateral ridge). It is non mobile, and likely represents calcified normal variant structure. 7. No intracardiac  thrombi or masses were visualized.  Dg Chest Port 1 View 09/01/2018 Endotracheal tube in satisfactory position. No acute infiltrate is noted.   EKG - SR rate 74 BPM. (See cardiology reading for complete details)    ASSESSMENT: Monica [name] is a 83 y.o. year old female presented to [facility] ED with  sudden onset aphasia and right hemiparesis on 09/01/2018 with stroke work-up revealing left MCA punctate small infarct due to left M1 occlusion status post TPA and IR with TICI 3 reperfusion embolic pattern secondary to bilateral DVT and new onset atrial flutter. Vascular risk factors include new onset atrial flutter, bilateral DVT, HTN, chronic diastolic HF, advanced age, former tobacco use, EtOH use, obesity and OSA.  Recovered well from a stroke standpoint without residual deficits or reoccurring/new stroke/TIA symptoms.  At prior visit, greatest concern regarding back pain with radiculopathy greatly limiting function and mobility along with increased depression/anxiety likely post stroke.  Great improvement of lower back pain with use of gabapentin and resolution of prior depression/anxiety symptoms.    PLAN:  1. Left MCA infarcts: Continue Eliquis (apixaban) daily  for secondary stroke prevention. Maintain strict control of hypertension with blood pressure goal below 130/90, diabetes with hemoglobin A1c goal below 6.5% and cholesterol with LDL cholesterol (bad cholesterol) goal below 70 mg/dL.  I also advised the patient to eat a healthy diet with plenty of whole grains, cereals, fruits and vegetables, exercise regularly with at least 30 minutes of continuous activity daily and maintain ideal body weight. 2. HTN: Advised to continue current treatment regimen.  Advised to continue to monitor at home along with continued follow-up with PCP for management 3. Atrial flutter: Continue Eliquis along with ongoing follow-up with cardiology 4. Acute bilateral DVT: Continue treatment with Eliquis 5. Lower back pain: Evaluated by neurosurgery who per patient, recommended potential surgical procedure which she is not interested in at this time.  She endorses benefit with use of gabapentin and recommended continuation of 300 mg 3 times daily.  Also discussed aquatic therapy and she was provided information for  [facility] in [place], [place] -she is aware to call office if she is interested for referral to be placed 6. Reactive depression, poststroke: Greatly improved and stable at this time.  No indication to initiate antidepressant 7. She questions return to driving.  At prior visit, recommended holding off due to lower back pain with radiculopathy.  As she has shown great improvement and stability of mental state, she would be safe to return to driving from a neurological standpoint.  Discussion regarding gradual return to driving and she verbalized understanding    Follow up in 2 months or call earlier if needed   Greater than 50% of time during this 25 minute visit was spent on counseling, explanation of diagnosis of left MCA infarcts, reviewing risk factor management of HTN, atrial flutter, bilateral DVT, discussion regarding back pain with radiculopathy and ongoing use of gabapentin and possible aquatic therapy, planning of further management along with potential future management, and answered all questions to patient satisfaction    Frann Rider, AGNP-BC  [facility] [address] [place],  [address]  Phone [phone] Fax [phone] Note: This document was prepared with digital dictation and possible smart phrase technology. Any transcriptional errors that result from this process are unintentional.

## 2018-12-26 NOTE — Progress Notes (Signed)
Chief Complaint  Patient presents with  . Establish Care   New patient [id number] complicated with chronic medical conditions  Leg DVT (deep venous thromboembolism), acute, bilateral ([place]) 08/2018 and iron def anemia on On eliquis. No caused of DVT worked up this far   Vitamin D deficiency - Plan: Vitamin D (25 hydroxy)  Hyperlipidemia with h/o stroke goal LDL <70 - Plan: Lipid panel  Lower abdominal pain and upper abdominal pain Dysuria new x weeks, nothing tried pain is mild to moderate and intermittent   Chronic low back pain 2/2 lumbar spinal stenosis - Plan: HYDROcodone-acetaminophen (NORCO/VICODIN) 5-325 MG tablet wants refill   Cerebrovascular accident (CVA), unspecified mechanism ([place]) 08/2018 s/p thrombectomy   H/o nose bleeding 12/20/18 on eliquis     Review of Systems  Constitutional: Negative for weight loss.  HENT: Negative for hearing loss.        H/o nose bleeds   Eyes: Negative for blurred vision.  Respiratory: Negative for shortness of breath.   Cardiovascular: Negative for chest pain.  Gastrointestinal: Positive for abdominal pain.  Genitourinary: Positive for dysuria.  Musculoskeletal: Positive for back pain.  Skin: Negative for rash.  Neurological: Negative for headaches.  Psychiatric/Behavioral: Negative for depression and memory loss.   Past Medical History:  Diagnosis Date  . Allergic rhinitis   . Anxiety   . Benign neoplasm of breast 2013  . Cardiac arrhythmia   . Cardiomegaly   . Cataract   . Compulsive eating patterns   . Compulsive overeating   . Depression   . Diastolic heart failure (HCC)    LVH with free wall thickness 1.4 cm EF 60% Dr. Tamala Julian   . Diffuse cystic mastopathy 2013  . Edema   . Epistaxis    noted 12/20/18   . GI bleeding    in ~2018   . Gout   . Hiatal hernia   . HTN (hypertension)   . Hyperlipidemia   . Lump or mass in breast 2012  . OA (osteoarthritis) of knee    with injections  . Obesity   . OSA (obstructive  sleep apnea)    not on cpap  . Other abnormal glucose   . Peptic stricture of esophagus   . Personal history of tobacco use, presenting hazards to health   . Sciatica   . Sinus infection 2010  . Spinal stenosis    lumbar   . Stroke ([place])   . Unspecified sleep apnea    Past Surgical History:  Procedure Laterality Date  . adenosine cardiolite  1/08   low risk   . APPENDECTOMY    . BREAST BIOPSY     02/28/11 negative  . BREAST LUMPECTOMY     right x1 ('89) left x2 ('70s, '90)  . CARDIAC CATHETERIZATION  2001   normal per pt  . COLONOSCOPY  11/02   diverticulosis  . COLONOSCOPY  9/06   diverticulosis, hemorhoids  . COLONOSCOPY N/A 10/01/2016   Procedure: COLONOSCOPY;  Surgeon: Gatha Mayer, MD;  Location: [facility];  Service: Endoscopy;  Laterality: N/A;  . dexa  3/02   normal  . EYE SURGERY  2012   cataract  . IR CT HEAD LTD  09/01/2018  . IR PERCUTANEOUS ART THROMBECTOMY/INFUSION INTRACRANIAL INC DIAG ANGIO  09/01/2018  . JOINT REPLACEMENT     knee b/l 2011  . knee replacement Right 9/11   R. Dr. Telford Nab  . RADIOLOGY WITH ANESTHESIA N/A 09/01/2018   Procedure: IR WITH ANESTHESIA;  Surgeon: Estanislado Pandy,  Willaim Rayas, MD;  Location: [facility];  Service: Radiology;  Laterality: N/A;  . sleep study  5/05  . TONSILLECTOMY    . TOTAL ABDOMINAL HYSTERECTOMY     fibroids, age of 18  . TOTAL KNEE ARTHROPLASTY Left 2012  . US TRANSVAGINAL PELVIC MODIFIED  2000, 2001   Family History  Problem Relation Age of Onset  . Stroke Father   . Hypertension Mother        (a lot of animosity in their relationship)  . Heart failure Mother   . Gout Other        whole family   . Prostate cancer Brother   . Cancer Brother        prostate   . Hepatitis Brother        Hep C; alcoholism (terminal)  . Drug abuse Brother        died 47  . Breast cancer Sister   . Cancer Sister        breast cancer  . Other Brother        drug addiction   Social History   Socioeconomic History  . Marital  status: Widowed    Spouse name: Not on file  . Number of children: 1  . Years of education: Not on file  . Highest education level: Not on file  Occupational History  . Occupation: Retired Ship broker: RETIRED  Social Needs  . Financial resource strain: Not on file  . Food insecurity    Worry: Not on file    Inability: Not on file  . Transportation needs    Medical: Not on file    Non-medical: Not on file  Tobacco Use  . Smoking status: Former Smoker    Packs/day: 0.50    Years: 20.00    Pack years: 10.00    Types: Cigarettes  . Smokeless tobacco: Never Used  . Tobacco comment: quit approx. 20 years ago  Substance and Sexual Activity  . Alcohol use: Yes    Alcohol/week: 0.0 standard drinks    Comment: wine occasional  . Drug use: No  . Sexual activity: Never  Lifestyle  . Physical activity    Days per week: Not on file    Minutes per session: Not on file  . Stress: Not on file  Relationships  . Social Herbalist on phone: Not on file    Gets together: Not on file    Attends religious service: Not on file    Active member of club or organization: Not on file    Attends meetings of clubs or organizations: Not on file    Relationship status: Not on file  . Intimate partner violence    Fear of current or ex partner: Not on file    Emotionally abused: Not on file    Physically abused: Not on file    Forced sexual activity: Not on file  Other Topics Concern  . Not on file  Social History Narrative   Widowed x 30+ years as of 12/2018    1 daughter. 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 allopurinol (ZYLOPRIM) 100 MG tablet TAKE 2 TABLETS BY MOUTH ONCE A DAY *NEED OFFICE VISIT (Patient taking differently: Take 200 mg by mouth daily. )  . ALPRAZolam (XANAX) 0.25 MG tablet Take 1 tablet (0.25 mg total) by mouth  2 (two) times daily as needed for  anxiety.  Marland Kitchen amLODipine (NORVASC) 5 MG tablet Take 1 tablet (5 mg total) by mouth daily.  . DULoxetine (CYMBALTA) 30 MG capsule Take 30 mg by mouth daily.  . furosemide (LASIX) 40 MG tablet Take 1 tablet (40 mg total) by mouth daily.  Marland Kitchen HYDROcodone-acetaminophen (NORCO/VICODIN) 5-325 MG tablet Take 1 tablet by mouth 2 (two) times daily as needed for moderate pain.  . Magnesium 250 MG TABS Take 250 mg by mouth daily.  . metoprolol succinate (TOPROL-XL) 100 MG 24 hr tablet TAKE 1 TABLET BY MOUTH DAILY WITH OR IMMEDIATLY FOLLOWING A MEAL (Patient taking differently: Take 100 mg by mouth daily. )  . potassium chloride SA (KLOR-CON) 20 MEQ tablet Take 1 tablet (20 mEq total) by mouth daily.  . [DISCONTINUED] ELIQUIS 5 MG TABS tablet TAKE 1 TABLET BY MOUTH TWICE A DAY  . [DISCONTINUED] gabapentin (NEURONTIN) 300 MG capsule Take 1 capsule (300 mg total) by mouth 2 (two) times daily.  . [DISCONTINUED] HYDROcodone-acetaminophen (NORCO/VICODIN) 5-325 MG tablet Take 1 tablet by mouth every 6 (six) hours as needed for moderate pain.  . [DISCONTINUED] HYDROcodone-acetaminophen (NORCO/VICODIN) 5-325 MG tablet Take 1 tablet by mouth 2 (two) times daily as needed for moderate pain.   Allergies  Allergen Reactions  . Buspirone Hcl Other (See Comments)    REACTION: made her sleepy, ? swollen legs  . Lisinopril Other (See Comments)    REACTION: dizziness  . Prozac [Fluoxetine Hcl] Other (See Comments)    sleepy   Recent Results (from the past 2160 hour(s))  Basic metabolic panel     Status: Abnormal   Collection Time: 10/17/18  2:41 PM  Result Value Ref Range   Glucose 88 65 - 99 mg/dL   BUN 25 8 - 27 mg/dL   Creatinine, Ser 1.54 (H) 0.57 - 1.00 mg/dL   GFR calc non Af Amer 31 (L) >59 mL/min/1.73   GFR calc Af Amer 36 (L) >59 mL/min/1.73   BUN/Creatinine Ratio 16 12 - 28   Sodium 146 (H) 134 - 144 mmol/L   Potassium 4.8 3.5 - 5.2 mmol/L   Chloride 104 96 - 106 mmol/L   CO2 28 20 - 29 mmol/L   Calcium  10.0 8.7 - 10.3 mg/dL  CBC     Status: Abnormal   Collection Time: 10/17/18  2:41 PM  Result Value Ref Range   WBC 7.0 3.4 - 10.8 x10E3/uL   RBC 4.37 3.77 - 5.28 x10E6/uL   Hemoglobin 10.9 (L) 11.1 - 15.9 g/dL   Hematocrit 35.1 34.0 - 46.6 %   MCV 80 79 - 97 fL   MCH 24.9 (L) 26.6 - 33.0 pg   MCHC 31.1 (L) 31.5 - 35.7 g/dL   RDW 16.4 (H) 11.7 - 15.4 %   Platelets 359 150 - 450 [id number]  Basic metabolic panel     Status: Abnormal   Collection Time: 12/13/18 10:34 AM  Result Value Ref Range   Glucose 88 65 - 99 mg/dL   BUN 15 8 - 27 mg/dL   Creatinine, Ser 1.03 (H) 0.57 - 1.00 mg/dL   GFR calc non Af Amer 51 (L) >59 mL/min/1.73   GFR calc Af Amer 59 (L) >59 mL/min/1.73   BUN/Creatinine Ratio 15 12 - 28   Sodium 143 134 - 144 mmol/L   Potassium 3.6 3.5 - 5.2 mmol/L   Chloride 104 96 - 106 mmol/L   CO2 24 20 - 29 mmol/L  Calcium 9.2 8.7 - 10.3 mg/dL  Hepatic function panel     Status: None   Collection Time: 12/13/18 10:34 AM  Result Value Ref Range   Total Protein 6.7 6.0 - 8.5 g/dL   Albumin 4.1 3.6 - 4.6 g/dL   Bilirubin Total 0.4 0.0 - 1.2 mg/dL   Bilirubin, Direct 0.12 0.00 - 0.40 mg/dL   Alkaline Phosphatase 91 39 - 117 IU/L   AST 22 0 - 40 IU/L   ALT 13 0 - 32 IU/L  Iron, TIBC and Ferritin Panel     Status: Abnormal   Collection Time: 12/26/18 11:46 AM  Result Value Ref Range   Iron 41 (L) 45 - 160 mcg/dL   TIBC 337 250 - 450 mcg/dL (calc)   %SAT 12 (L) 16 - 45 % (calc)   Ferritin 14 (L) 16 - 288 ng/mL  Vitamin D (25 hydroxy)     Status: Abnormal   Collection Time: 12/26/18 11:46 AM  Result Value Ref Range   VITD 26.55 (L) 30.00 - 100.00 ng/mL  Lipid panel     Status: Abnormal   Collection Time: 12/26/18 11:46 AM  Result Value Ref Range   Cholesterol 194 0 - 200 mg/dL    Comment: ATP III Classification       Desirable:  < 200 mg/dL               Borderline High:  200 - 239 mg/dL          High:  > = 240 mg/dL   Triglycerides 74.0 0.0 - 149.0 mg/dL    Comment:  Normal:  <150 mg/dLBorderline High:  150 - 199 mg/dL   HDL 47.30 >39.00 mg/dL   VLDL 14.8 0.0 - 40.0 mg/dL   LDL Cholesterol 132 (H) 0 - 99 mg/dL   Total CHOL/HDL Ratio 4     Comment:                Men          Women1/2 Average Risk     3.4          3.3Average Risk          5.0          4.42X Average Risk          9.6          7.13X Average Risk          15.0          11.0                       NonHDL 147.11     Comment: NOTE:  Non-HDL goal should be 30 mg/dL higher than patient's LDL goal (i.e. LDL goal of < 70 mg/dL, would have non-HDL goal of < 100 mg/dL)  Urinalysis, Routine w reflex microscopic     Status: Abnormal   Collection Time: 12/26/18 11:47 AM  Result Value Ref Range   Color, Urine YELLOW YELLOW   APPearance CLOUDY (A) CLEAR   Specific Gravity, Urine 1.021 1.001 - 1.03   pH 6.0 5.0 - 8.0   Glucose, UA NEGATIVE NEGATIVE   Bilirubin Urine NEGATIVE NEGATIVE   Ketones, ur NEGATIVE NEGATIVE   Hgb urine dipstick NEGATIVE NEGATIVE   Protein, ur NEGATIVE NEGATIVE   Nitrite NEGATIVE NEGATIVE   Leukocytes,Ua TRACE (A) NEGATIVE   WBC, UA 0-5 0 - 5 /HPF   RBC / HPF 3-10 (A) 0 - 2 /  HPF   Squamous Epithelial / LPF 6-10 (A) < OR = 5 /HPF   Bacteria, UA MANY (A) NONE SEEN /HPF   Hyaline Cast 0-1 (A) NONE SEEN /LPF  Urine Culture     Status: None   Collection Time: 12/26/18 11:56 AM   Specimen: Urine  Result Value Ref Range   MICRO NUMBER: [id number]    SPECIMEN QUALITY: Adequate    Sample Source URINE    STATUS: FINAL    ISOLATE 1:      Growth of mixed flora was isolated, suggesting probable contamination. No further testing will be performed. If clinically indicated, recollection using a method to minimize contamination, with prompt transfer to Urine Culture Transport Tube, is  recommended.    Objective  Body mass index is 37.09 kg/m. Wt Readings from Last 3 Encounters:  12/26/18 239 lb 9.6 oz (108.7 kg)  12/26/18 236 lb 12.8 oz (107.4 kg)  12/04/18 237 lb 12.8 oz (107.9  kg)   Temp Readings from Last 3 Encounters:  01/02/19 98 F (36.7 C) (Oral)  12/26/18 98 F (36.7 C)  12/26/18 98.1 F (36.7 C) (Skin)   BP Readings from Last 3 Encounters:  01/02/19 (!) 161/62  12/26/18 (!) 144/75  12/26/18 130/78   Pulse Readings from Last 3 Encounters:  01/02/19 68  12/26/18 84  12/26/18 71    Physical Exam Vitals signs and nursing note reviewed.  Constitutional:      Appearance: Normal appearance. She is well-developed and well-groomed. She is obese.     Comments: +mask on    HENT:     Head: Normocephalic and atraumatic.  Eyes:     Conjunctiva/sclera: Conjunctivae normal.     Pupils: Pupils are equal, round, and reactive to light.  Cardiovascular:     Rate and Rhythm: Normal rate and regular rhythm.     Heart sounds: Normal heart sounds. No murmur.  Pulmonary:     Effort: Pulmonary effort is normal.     Breath sounds: Normal breath sounds.  Abdominal:     Tenderness: There is abdominal tenderness in the left upper quadrant and left lower quadrant.  Skin:    General: Skin is warm and dry.  Neurological:     General: No focal deficit present.     Mental Status: She is alert and oriented to person, place, and time. Mental status is at baseline.     Gait: Gait normal.  Psychiatric:        Attention and Perception: Attention and perception normal.        Mood and Affect: Mood and affect normal.        Speech: Speech normal.        Behavior: Behavior normal. Behavior is cooperative.        Thought Content: Thought content normal.        Cognition and Memory: Cognition and memory normal.        Judgment: Judgment normal.     Assessment  Plan  Leg DVT (deep venous thromboembolism), acute, bilateral ([place]) 08/2018 and iron def anemia- Plan: Ambulatory referral to Hematology, Ambulatory referral to Hematology / Oncology Iron labs today  On eliquis   Vitamin D deficiency - Plan: Vitamin D (25 hydroxy)  Hyperlipidemia with h/o stroke goal LDL  <70 - Plan: Lipid panel  Lower abdominal pain and upper abdominal pain Dysuria - Plan: Urine Culture CT ab/pelvis r/o malignancy s/p hysterectomy   Chronic low back pain 2/2 lumbar spinal stenosis - Plan: HYDROcodone-acetaminophen (NORCO/VICODIN)  5-325 MG tablet, Ambulatory referral to Pain Clinic  Cerebrovascular accident (CVA), unspecified mechanism ([place]) -f/u [facility] Generalized abdominal pain - Plan: CT Abdomen Pelvis W Contrast  H/o nose bleeding 12/20/18 on eliquis   HM Check labs today   Flu shot utd 12/05/18 prevnar utd  pna 23 due had 12/03/13  Tdap per prior PCP had 12/19/16  consider shingrix  in future   Out of age window pap s/p hysterectomy ? What is left in I.e ovaries possibly b/l ovaries out  Mammogram 02/28/2018 negative  Colonoscopy 10/08/16 negative Dr. Carlean Purl  dexa 03/05/15 normal   Former smoker age 6-40 max 1ppd   Of note h/o anxiety depression PCP prior was going to start lexapro or celexa   Prior PCPs Dr. Lacinda Axon and Dr. Delfina Redwood in [facility]  Cards Dr. Tamala Julian f/u 01/02/2019 pending holter monitor per pt   Consider ob/gyn referral in future will 1st do CT ab/pelvis to w/u female pelvic pain disc Dr. Garwin Brothers referral  Provider: Dr. Olivia Mackie McLean-Scocuzza-Internal Medicine

## 2018-12-27 ENCOUNTER — Other Ambulatory Visit: Payer: Self-pay | Admitting: Cardiology

## 2018-12-27 LAB — URINALYSIS, ROUTINE W REFLEX MICROSCOPIC
Bilirubin Urine: NEGATIVE
Glucose, UA: NEGATIVE
Hgb urine dipstick: NEGATIVE
Ketones, ur: NEGATIVE
Nitrite: NEGATIVE
Protein, ur: NEGATIVE
Specific Gravity, Urine: 1.021 (ref 1.001–1.03)
pH: 6 (ref 5.0–8.0)

## 2018-12-27 LAB — IRON,TIBC AND FERRITIN PANEL
%SAT: 12 % (calc) — ABNORMAL LOW (ref 16–45)
Ferritin: 14 ng/mL — ABNORMAL LOW (ref 16–288)
Iron: 41 ug/dL — ABNORMAL LOW (ref 45–160)
TIBC: 337 mcg/dL (calc) (ref 250–450)

## 2018-12-27 NOTE — Telephone Encounter (Signed)
Refill Request.  

## 2018-12-28 LAB — URINE CULTURE
MICRO NUMBER:: 1064013
SPECIMEN QUALITY:: ADEQUATE

## 2018-12-31 ENCOUNTER — Encounter: Payer: Self-pay | Admitting: Internal Medicine

## 2018-12-31 ENCOUNTER — Other Ambulatory Visit: Payer: Self-pay | Admitting: Internal Medicine

## 2018-12-31 DIAGNOSIS — M109 Gout, unspecified: Secondary | ICD-10-CM

## 2018-12-31 DIAGNOSIS — M48061 Spinal stenosis, lumbar region without neurogenic claudication: Secondary | ICD-10-CM | POA: Insufficient documentation

## 2018-12-31 DIAGNOSIS — I499 Cardiac arrhythmia, unspecified: Secondary | ICD-10-CM

## 2018-12-31 DIAGNOSIS — K219 Gastro-esophageal reflux disease without esophagitis: Secondary | ICD-10-CM

## 2018-12-31 DIAGNOSIS — M4800 Spinal stenosis, site unspecified: Secondary | ICD-10-CM | POA: Insufficient documentation

## 2018-12-31 MED ORDER — AMIODARONE HCL 200 MG PO TABS: 200.0000 mg | ORAL_TABLET | Freq: Every day | ORAL | 3 refills | Status: DC

## 2018-12-31 MED ORDER — COLCHICINE 0.6 MG PO TABS: 0.6000 mg | ORAL_TABLET | Freq: Every day | ORAL | Status: DC

## 2018-12-31 MED ORDER — PANTOPRAZOLE SODIUM 40 MG PO TBEC: 40.0000 mg | DELAYED_RELEASE_TABLET | Freq: Every day | ORAL | Status: DC

## 2018-12-31 NOTE — Progress Notes (Signed)
I agree with the above plan 

## 2019-01-02 ENCOUNTER — Other Ambulatory Visit: Payer: Self-pay

## 2019-01-02 ENCOUNTER — Emergency Department (HOSPITAL_COMMUNITY)
Admission: EM | Admit: 2019-01-02 | Discharge: 2019-01-02 | Disposition: A | Discharge status: Home / Self Care | Payer: Medicare Other | Attending: Emergency Medicine | Admitting: Emergency Medicine

## 2019-01-02 ENCOUNTER — Encounter (HOSPITAL_COMMUNITY): Payer: Self-pay | Admitting: Obstetrics and Gynecology

## 2019-01-02 ENCOUNTER — Other Ambulatory Visit: Payer: Self-pay | Admitting: Nurse Practitioner

## 2019-01-02 ENCOUNTER — Ambulatory Visit (INDEPENDENT_AMBULATORY_CARE_PROVIDER_SITE_OTHER): Payer: Medicare Other

## 2019-01-02 DIAGNOSIS — Z8673 Personal history of transient ischemic attack (TIA), and cerebral infarction without residual deficits: Secondary | ICD-10-CM | POA: Insufficient documentation

## 2019-01-02 DIAGNOSIS — Z7901 Long term (current) use of anticoagulants: Secondary | ICD-10-CM | POA: Diagnosis not present

## 2019-01-02 DIAGNOSIS — I639 Cerebral infarction, unspecified: Secondary | ICD-10-CM

## 2019-01-02 DIAGNOSIS — Z87891 Personal history of nicotine dependence: Secondary | ICD-10-CM | POA: Diagnosis not present

## 2019-01-02 DIAGNOSIS — Z79899 Other long term (current) drug therapy: Secondary | ICD-10-CM | POA: Insufficient documentation

## 2019-01-02 DIAGNOSIS — Z96652 Presence of left artificial knee joint: Secondary | ICD-10-CM | POA: Insufficient documentation

## 2019-01-02 DIAGNOSIS — I4892 Unspecified atrial flutter: Secondary | ICD-10-CM

## 2019-01-02 DIAGNOSIS — Z8679 Personal history of other diseases of the circulatory system: Secondary | ICD-10-CM

## 2019-01-02 DIAGNOSIS — I110 Hypertensive heart disease with heart failure: Secondary | ICD-10-CM | POA: Diagnosis not present

## 2019-01-02 DIAGNOSIS — I10 Essential (primary) hypertension: Secondary | ICD-10-CM

## 2019-01-02 DIAGNOSIS — I5032 Chronic diastolic (congestive) heart failure: Secondary | ICD-10-CM | POA: Diagnosis not present

## 2019-01-02 DIAGNOSIS — Z96651 Presence of right artificial knee joint: Secondary | ICD-10-CM | POA: Insufficient documentation

## 2019-01-02 DIAGNOSIS — R040 Epistaxis: Secondary | ICD-10-CM

## 2019-01-02 HISTORY — DX: Cerebral infarction, unspecified: I63.9

## 2019-01-02 MED ORDER — AMLODIPINE BESYLATE 5 MG PO TABS
2.5000 mg | ORAL_TABLET | Freq: Once | ORAL | Status: AC
  Administered 2019-01-02: 2.5 mg via ORAL
  Filled 2019-01-02: qty 1

## 2019-01-02 MED ORDER — LIDOCAINE HCL 4 % EX SOLN
Freq: Once | CUTANEOUS | Status: AC
  Administered 2019-01-02: 1 mL via TOPICAL
  Filled 2019-01-02: qty 50

## 2019-01-02 MED ORDER — OXYMETAZOLINE HCL 0.05 % NA SOLN
1.0000 | Freq: Two times a day (BID) | NASAL | Status: DC
  Administered 2019-01-02: 20:00:00 1 via NASAL
  Filled 2019-01-02: qty 30

## 2019-01-02 MED ORDER — TRANEXAMIC ACID 1000 MG/10ML IV SOLN
500.0000 mg | Freq: Once | INTRAVENOUS | Status: AC
  Administered 2019-01-02: 500 mg via TOPICAL
  Filled 2019-01-02: qty 10

## 2019-01-02 NOTE — ED Provider Notes (Signed)
[facility] DEPT Provider Note   CSN: [id number] Arrival date & time: 01/02/19  1810     History   Chief Complaint Chief Complaint  Patient presents with  . Epistaxis    HPI Laren Lawrie is a 83 y.o. female.     HPI Patient presents to the ED for evaluation of a nosebleed.  Patient is currently on Eliquis.  She said a few months ago she had a nosebleed but thought it was related to scratching her nose.  This evening however the patient was out to dinner when also on she started spontaneously bleeding from the left nares.  She denies any recent injuries.  No fevers or chills.  Patient was evaluated by EMS and had packing placed into her nose. Past Medical History:  Diagnosis Date  . Allergic rhinitis   . Anxiety   . Benign neoplasm of breast 2013  . Cardiomegaly   . Cataract   . Compulsive eating patterns   . Compulsive overeating   . Depression   . Diastolic heart failure (HCC)    LVH with free wall thickness 1.4 cm EF 60% Dr. Tamala Julian   . Diffuse cystic mastopathy 2013  . Edema   . GI bleeding    in ~2018   . Gout   . HTN (hypertension)   . Hyperlipidemia   . Lump or mass in breast 2012  . OA (osteoarthritis) of knee    with injections  . Obesity   . OSA (obstructive sleep apnea)    not on cpap  . Other abnormal glucose   . Personal history of tobacco use, presenting hazards to health   . Sciatica   . Sinus infection 2010  . Spinal stenosis    lumbar   . Stroke ([place])   . Unspecified sleep apnea     Patient Active Problem List   Diagnosis Date Noted  . Spinal stenosis   . Vitamin D deficiency 12/26/2018  . Acute ischemic left MCA stroke ([place]) 09/01/2018  . Middle cerebral artery embolism, 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  . Degenerative lumbar spinal stenosis 09/20/2017  . Lumbar radiculopathy 07/31/2017  . Lower GI bleed 10/03/2016  . Diverticulosis of colon with hemorrhage   . Status post bilateral knee replacements 05/04/2016  .  Chronic cough 04/15/2016  . Seborrheic keratoses 04/15/2016  . Cervical disc disorder with radiculopathy of cervical region 12/01/2015  . Partial nontraumatic rupture of right rotator cuff 09/22/2015  . Compulsive overeating 01/01/2015  . Chronic diastolic heart failure (HCC) 12/12/2012    Class: Chronic  . Urge incontinence of urine 09/09/2011  . Knee osteoarthritis 02/09/2010  . Gout 03/20/2008  . Hyperlipidemia 12/21/2006  . Depression with anxiety 12/21/2006  . Essential hypertension 12/21/2006  . Obstructive sleep apnea 12/21/2006    Past Surgical History:  Procedure Laterality Date  . adenosine cardiolite  1/08   low risk   . APPENDECTOMY    . BREAST LUMPECTOMY     right x1 ('89) left x2 ('70s, '90)  . CARDIAC CATHETERIZATION  2001   normal per pt  . COLONOSCOPY  11/02   diverticulosis  . COLONOSCOPY  9/06   diverticulosis, hemorhoids  . COLONOSCOPY N/A 10/01/2016   Procedure: COLONOSCOPY;  Surgeon: Gatha Mayer, MD;  Location: [facility];  Service: Endoscopy;  Laterality: N/A;  . dexa  3/02   normal  . EYE SURGERY  2012   cataract  . IR CT HEAD LTD  09/01/2018  . IR PERCUTANEOUS ART THROMBECTOMY/INFUSION  INTRACRANIAL INC DIAG ANGIO  09/01/2018  . JOINT REPLACEMENT     knee b/l 2011  . knee replacement Right 9/11   R. Dr. Telford Nab  . RADIOLOGY WITH ANESTHESIA N/A 09/01/2018   Procedure: IR WITH ANESTHESIA;  Surgeon: Luanne Bras, MD;  Location: [facility];  Service: Radiology;  Laterality: N/A;  . sleep study  5/05  . TONSILLECTOMY    . TOTAL ABDOMINAL HYSTERECTOMY     fibroids, age of 48  . TOTAL KNEE ARTHROPLASTY Left 2012  . US TRANSVAGINAL PELVIC MODIFIED  2000, 2001     OB History    Gravida  2   Para      Term      Preterm      AB  1   Living  1     SAB  1   TAB      Ectopic      Multiple      Live Births           Obstetric Comments  Age with first menstruation-14 Age with first pregnancy-22 LMP-age 65, hysterectomy          Home Medications    Prior to Admission medications   Medication Sig Start Date End Date Taking? Authorizing Provider  allopurinol (ZYLOPRIM) 100 MG tablet TAKE 2 TABLETS BY MOUTH ONCE A DAY *NEED OFFICE VISIT Patient taking differently: Take 200 mg by mouth daily.  09/05/18  Yes Donzetta Starch, NP  ALPRAZolam Duanne Moron) 0.25 MG tablet Take 1 tablet (0.25 mg total) by mouth 2 (two) times daily as needed for anxiety. 12/04/18  Yes Belva Crome, MD  amiodarone (PACERONE) 200 MG tablet Take 1 tablet (200 mg total) by mouth daily. 12/31/18  Yes McLean-Scocuzza, Nino Glow, MD  amLODipine (NORVASC) 5 MG tablet Take 1 tablet (5 mg total) by mouth daily. 12/04/18 03/04/19 Yes Belva Crome, MD  colchicine 0.6 MG tablet Take 1 tablet (0.6 mg total) by mouth daily. Patient taking differently: Take 0.6 mg by mouth daily as needed (gout).  12/31/18  Yes McLean-Scocuzza, Nino Glow, MD  DULoxetine (CYMBALTA) 30 MG capsule Take 30 mg by mouth daily.   Yes [provider]  ELIQUIS 5 MG TABS tablet TAKE 1 TABLET BY MOUTH TWICE A DAY Patient taking differently: Take 5 mg by mouth 2 (two) times daily.  12/27/18  Yes Lyda Jester M, PA-C  furosemide (LASIX) 40 MG tablet Take 1 tablet (40 mg total) by mouth daily. 12/04/18  Yes Belva Crome, MD  gabapentin (NEURONTIN) 300 MG capsule Take 1 capsule (300 mg total) by mouth 3 (three) times daily. 12/26/18  Yes McCue, Janett Billow, NP  HYDROcodone-acetaminophen (NORCO/VICODIN) 5-325 MG tablet Take 1 tablet by mouth every 6 (six) hours as needed for moderate pain.   Yes [provider]  Magnesium 250 MG TABS Take 250 mg by mouth daily.   Yes [provider]  metoprolol succinate (TOPROL-XL) 100 MG 24 hr tablet TAKE 1 TABLET BY MOUTH DAILY WITH OR IMMEDIATLY FOLLOWING A MEAL Patient taking differently: Take 100 mg by mouth daily.  06/06/16  Yes Cook, Jayce G, DO  pantoprazole (PROTONIX) 40 MG tablet Take 1 tablet (40 mg total) by mouth daily.  12/31/18  Yes McLean-Scocuzza, Nino Glow, MD  potassium chloride SA (KLOR-CON) 20 MEQ tablet Take 1 tablet (20 mEq total) by mouth daily. 12/04/18  Yes Belva Crome, MD    Family History Family History  Problem Relation Age of Onset  .  Stroke Father   . Hypertension Mother        (a lot of animosity in their relationship)  . Heart failure Mother   . Gout Other        whole family   . Prostate cancer Brother   . Cancer Brother        prostate   . Hepatitis Brother        Hep C; alcoholism (terminal)  . Drug abuse Brother        died 74  . Breast cancer Sister   . Cancer Sister        breast cancer  . Other Brother        drug addiction    Social History Social History   Tobacco Use  . Smoking status: Former Smoker    Packs/day: 0.50    Years: 20.00    Pack years: 10.00    Types: Cigarettes  . Smokeless tobacco: Never Used  . Tobacco comment: quit approx. 20 years ago  Substance Use Topics  . Alcohol use: Yes    Alcohol/week: 0.0 standard drinks    Comment: wine occasional  . Drug use: No     Allergies   Buspirone hcl, Lisinopril, and Prozac [fluoxetine hcl]   Review of Systems Review of Systems  All other systems reviewed and are negative.    Physical Exam Updated Vital Signs BP (!) 161/62   Pulse 68   Temp 98 F (36.7 C) (Oral)   Resp 16   SpO2 98%   Physical Exam Vitals signs and nursing note reviewed.  Constitutional:      General: She is not in acute distress.    Appearance: She is well-developed.  HENT:     Head: Normocephalic and atraumatic.     Right Ear: External ear normal.     Left Ear: External ear normal.     Nose:     Comments: Blood, clot left nares Eyes:     General: No scleral icterus.       Right eye: No discharge.        Left eye: No discharge.     Conjunctiva/sclera: Conjunctivae normal.  Neck:     Musculoskeletal: Neck supple.     Trachea: No tracheal deviation.  Cardiovascular:     Rate and Rhythm: Normal rate.   Pulmonary:     Effort: Pulmonary effort is normal. No respiratory distress.     Breath sounds: No stridor.  Abdominal:     General: There is no distension.  Musculoskeletal:        General: No swelling or deformity.  Skin:    General: Skin is warm and dry.     Findings: No rash.  Neurological:     Mental Status: She is alert.     Cranial Nerves: Cranial nerve deficit: no gross deficits.      ED Treatments / Results   Procedures .Epistaxis Management  Date/Time: 01/02/2019 7:13 PM Performed by: Dorie Rank, MD Authorized by: Dorie Rank, MD   Consent:    Consent obtained:  Verbal   Consent given by:  Patient   Risks discussed:  Bleeding and pain   Alternatives discussed:  No treatment Anesthesia (see MAR for exact dosages):    Anesthesia method:  Topical application   Topical anesthetic:  Lidocaine gel Procedure details:    Treatment site:  L anterior   Treatment method:  Merocel sponge (TXA soaked cotton ball)   Treatment complexity:  Limited  Treatment episode: initial   Post-procedure details:    Assessment:  Bleeding stopped   Patient tolerance of procedure:  Tolerated well, no immediate complications   (including critical care time)  Medications Ordered in ED Medications  oxymetazoline (AFRIN) 0.05 % nasal spray 1 spray (1 spray Each Nare Given During Downtime 01/02/19 1938)  amLODipine (NORVASC) tablet 2.5 mg (has no administration in time range)  lidocaine (XYLOCAINE) 4 % external solution (1 mL Topical Given 01/02/19 1938)  tranexamic acid (CYKLOKAPRON) injection 500 mg (500 mg Topical Given 01/02/19 1938)     Initial Impression / Assessment and Plan / ED Course  I have reviewed the triage vital signs and the nursing notes.  Pertinent labs & imaging results that were available during my care of the patient were reviewed by me and considered in my medical decision making (see chart for details).   Patient treated with a combination of Afrin, lidocaine  and TXA.  Merocel sponge placed without difficulty.  Patient was noted to be hypertensive.  I will give her an additional dose of her Norvasc.  Plan on discharge with outpatient ENT follow-up.  Final Clinical Impressions(s) / ED Diagnoses   Final diagnoses:  Left-sided epistaxis  Hypertension, unspecified type    ED Discharge Orders    None       Dorie Rank, MD 01/02/19 1947

## 2019-01-02 NOTE — ED Triage Notes (Signed)
Per EMS: Pt is coming from the [facility]. Patient reports she had abrupt onset of left nostril epistaxis. Patient reports she takes eliquis. Patient has a hx of stroke, DVT, and blood clot.  High BP reported recently (191/86) Patient given afrin. Patient has had this going on for 2 hours. Patient still having bleeding.  Last BP 171/108

## 2019-01-02 NOTE — ED Notes (Signed)
All nasal solutions at bedside for MD.

## 2019-01-03 ENCOUNTER — Telehealth: Payer: Self-pay | Admitting: Interventional Cardiology

## 2019-01-03 ENCOUNTER — Ambulatory Visit: Payer: Self-pay | Admitting: *Deleted

## 2019-01-03 ENCOUNTER — Encounter: Payer: Self-pay | Admitting: Internal Medicine

## 2019-01-03 DIAGNOSIS — R1084 Generalized abdominal pain: Secondary | ICD-10-CM

## 2019-01-03 DIAGNOSIS — D509 Iron deficiency anemia, unspecified: Secondary | ICD-10-CM

## 2019-01-03 DIAGNOSIS — D649 Anemia, unspecified: Secondary | ICD-10-CM | POA: Insufficient documentation

## 2019-01-03 DIAGNOSIS — I10 Essential (primary) hypertension: Secondary | ICD-10-CM

## 2019-01-03 HISTORY — DX: Iron deficiency anemia, unspecified: D50.9

## 2019-01-03 HISTORY — DX: Generalized abdominal pain: R10.84

## 2019-01-03 MED ORDER — HYDROCODONE-ACETAMINOPHEN 5-325 MG PO TABS: 1.0000 | ORAL_TABLET | Freq: Two times a day (BID) | ORAL | 0 refills | Status: DC | PRN

## 2019-01-03 NOTE — Telephone Encounter (Signed)
Start Spironolactone 12.5 mg po daily. BMET 2 weeks.

## 2019-01-03 NOTE — Telephone Encounter (Signed)
  Pt called in c/o BP being very elevated.  Yesterday evening around 5:30 she was out eating and developed a bad nose bleed.   EMS had to take her to the hospital because they could not get her nose to stop bleeding.  Her BP with the EMS was 200/100 something.   When she got to the hospital her BP was still 200s/100s.    She is on Eliquis due to a stroke.   Her cardiologist just put a heart monitor on her yesterday.  She is going to call him and let him know what happened to her.   I instructed her to go on to the ED.  Her blood pressure is too high and especially with her stroke and nose bleed history.   "I will call my daughter to take me to [facility] now".   That's where I went when I had my stroke".  He had a nose bleed near the end of Oct too due to high BP.  "They told me in the ER to let my primary doctor know is why I'm calling".     I sent my notes to the office for Dr. Terese Door.       Reason for Disposition . [id number] Systolic BP  >= 0000000 OR Diastolic >= [id number] AND [id number] cardiac or neurologic symptoms (e.g., chest pain, difficulty breathing, unsteady gait, blurred vision)  Answer Assessment - Initial Assessment Questions 1. BLOOD PRESSURE: "What is the blood pressure?" "Did you take at least two measurements 5 minutes apart?"     200/100s with paramedics,   Yesterday evening while I was out eating.    220/100 something when I checked it this morning.    My nose started bleeding last night so the EMS took me to the hospital and packed my nose to get it to stop bleeding.   I'm on Eloquis.   This happened on Oct 30 my nose started bleeding.   I'm to see the ENT doctor on Monday. 2. ONSET: "When did you take your blood pressure?" This morning 210/100 something when I checked it.      3. HOW: "How did you obtain the blood pressure?" (e.g., visiting nurse, automatic home BP monitor)     Wrist monitor at home.   I had my arm up and elbow down when I took it. 4. HISTORY: "Do you have a history  of high blood pressure?"     Yes 5. MEDICATIONS: "Are you taking any medications for blood pressure?" "Have you missed any doses recently?"     Yes 3 different medications for BP 6. OTHER SYMPTOMS: "Do you have any symptoms?" (e.g., headache, chest pain, blurred vision, difficulty breathing, weakness)     My cardiologist put a heart monitor on me yesterday.   I'll call him.   7. PREGNANCY: "Is there any chance you are pregnant?" "When was your last menstrual period?"     N/A due to age  Protocols used: [place]

## 2019-01-03 NOTE — Telephone Encounter (Signed)
Pt states that recently her SBPs have been in the 200s.  She has recently had nose bleeds when BP this high.  Yesterday was out at [facility] and it began to bleed.[facility] and they took her to [facility] because they could not get the bleed to stop.  Current medications are:  Amlodipine 5mg  QD Furosemide 40mg  QD Metoprolol Succinate 100mg  QD  BP at around Noon today was 174/88.  Pt rechecked just prior to my call and it was 139/76.  Took meds at [facility]. Pt admits to higher stress and anxiety levels recently due to watching [employer] and everything going on with politics/virus.  She forgot that she had been given Xanax previously.  She took one just prior to me calling and plans to relax the remainder of the afternoon.  Pt aware that I will send to Dr. Tamala Julian to see if he has further recommendations.

## 2019-01-03 NOTE — Telephone Encounter (Signed)
Pt c/o BP issue: STAT if pt c/o blurred vision, one-sided weakness or slurred speech  1. What are your last 5 BP readings? She is unsure, she states that her BP has been in the 200's since last night.  2. Are you having any other symptoms (ex. Dizziness, headache, blurred vision, passed out)? Nose bleeds  3. What is your BP issue? Patient has high blood pressure, went to the ER last night to have her nose packed due to a nose bleed. She denies any other symptoms. Patient was advised to go to the hospital by her PCP but she wants Dr. Tamala Julian to know.

## 2019-01-03 NOTE — Telephone Encounter (Signed)
Noted best to go to ED    [facility]

## 2019-01-04 ENCOUNTER — Other Ambulatory Visit: Payer: Self-pay | Admitting: Internal Medicine

## 2019-01-04 DIAGNOSIS — I639 Cerebral infarction, unspecified: Secondary | ICD-10-CM

## 2019-01-04 DIAGNOSIS — E785 Hyperlipidemia, unspecified: Secondary | ICD-10-CM

## 2019-01-04 MED ORDER — SPIRONOLACTONE 25 MG PO TABS: 12.5000 mg | ORAL_TABLET | Freq: Every day | ORAL | 3 refills | Status: DC

## 2019-01-04 MED ORDER — PRAVASTATIN SODIUM 20 MG PO TABS: 20.0000 mg | ORAL_TABLET | Freq: Every day | ORAL | 3 refills | Status: DC

## 2019-01-04 NOTE — Telephone Encounter (Signed)
Spoke with pt and went over recommendations.  Scheduled labs for 11/30 (we are closed on 11/27).  Advised pt to monitor BP and let us know if remaining high or if it is getting too low.  Pt verbalized understanding and was in agreement with this plan.

## 2019-01-07 DIAGNOSIS — R040 Epistaxis: Secondary | ICD-10-CM

## 2019-01-07 HISTORY — DX: Epistaxis: R04.0

## 2019-01-09 ENCOUNTER — Telehealth: Payer: Self-pay | Admitting: Internal Medicine

## 2019-01-09 NOTE — Telephone Encounter (Signed)
lvm to rtc. Dr. Olivia Mackie ordered a stat CT abdomen/pelvis. It has been scheduled at [facility] located at [place] (beside [facility] on [address] Her appointment is Thursday Nov. 19. She will need to arrive by 10:30 am for check in and her appointment will begin @ 11. She will need to go by this location today to pick up her prep kit (contrast drink) and they will give her instructions on when to start drinking this Thursday morning. She can not have anything to eat or drink after midnight for this appointment. If she needs to reschedule she can call [phone] option 3, option 2.

## 2019-01-10 ENCOUNTER — Ambulatory Visit: Payer: Medicare Other

## 2019-01-21 ENCOUNTER — Other Ambulatory Visit: Payer: Medicare Other | Admitting: *Deleted

## 2019-01-21 ENCOUNTER — Other Ambulatory Visit: Payer: Self-pay

## 2019-01-21 DIAGNOSIS — I10 Essential (primary) hypertension: Secondary | ICD-10-CM

## 2019-01-21 LAB — BASIC METABOLIC PANEL
BUN/Creatinine Ratio: 21 (ref 12–28)
BUN: 22 mg/dL (ref 8–27)
CO2: 26 mmol/L (ref 20–29)
Calcium: 9.4 mg/dL (ref 8.7–10.3)
Chloride: 102 mmol/L (ref 96–106)
Creatinine, Ser: 1.04 mg/dL — ABNORMAL HIGH (ref 0.57–1.00)
GFR calc Af Amer: 58 mL/min/{1.73_m2} — ABNORMAL LOW (ref >59)
GFR calc non Af Amer: 51 mL/min/{1.73_m2} — ABNORMAL LOW (ref >59)
Glucose: 87 mg/dL (ref 65–99)
Potassium: 4.6 mmol/L (ref 3.5–5.2)
Sodium: 139 mmol/L (ref 134–144)

## 2019-02-03 ENCOUNTER — Other Ambulatory Visit: Payer: Self-pay | Admitting: Cardiology

## 2019-02-04 NOTE — Telephone Encounter (Signed)
Eliquis 5mg  refill request received, pt is 83 yrs old, weight-108.7kg, Crea-1.04 on 01/21/2019, Diagnosis-Afib, Cva, and last seen by Dr. Tamala Julian on 12/04/2018. Dose is appropriate based on dosing criteria. Will send in refill to requested pharmacy.

## 2019-02-12 NOTE — Progress Notes (Signed)
Cardiology Office Note:    Date:  02/13/2019   ID:  [name], DOB 1935/10/05, MRN [id number]  PCP:  McLean-Scocuzza, Nino Glow, MD  Cardiologist:  Sinclair Grooms, MD   Referring MD: Seward Carol, MD   Chief Complaint  Patient presents with  . Congestive Heart Failure  . Atrial Fibrillation    History of Present Illness:    Monica [name] is a 83 y.o. female with a hx of Hypertension, chromic diastolic HF, CVA (embolic), pre-diabetes, GI bleeding, anticoagulation and amiodarone therapy.  She is doing relatively well.  She is with her daughter.  She is switch physicians from [facility] to Dr. Tressia Miners Mc[facility]-Scocuzza.  Since having her embolic CVA.  We discontinued amiodarone after she had conversion from atrial flutter.  She subsequently had a 30-day monitor and had no evidence of atrial fib atrial flutter.  The atrial flutter was felt to be acute and possibly related to her stroke however there is no way to be certain that underlying atrial fib/flutter was not the culprit for embolic episodes.  Past Medical History:  Diagnosis Date  . Allergic rhinitis   . Anxiety   . Benign neoplasm of breast 2013  . Cardiac arrhythmia   . Cardiomegaly   . Cataract   . Compulsive eating patterns   . Compulsive overeating   . Depression   . Diastolic heart failure (HCC)    LVH with free wall thickness 1.4 cm EF 60% Dr. Tamala Julian   . Diffuse cystic mastopathy 2013  . Edema   . Epistaxis    noted 12/20/18   . GI bleeding    in ~2018   . Gout   . Hiatal hernia   . HTN (hypertension)   . Hyperlipidemia   . Lump or mass in breast 2012  . OA (osteoarthritis) of knee    with injections  . Obesity   . OSA (obstructive sleep apnea)    not on cpap  . Other abnormal glucose   . Peptic stricture of esophagus   . Personal history of tobacco use, presenting hazards to health   . Sciatica   . Sinus infection 2010  . Spinal stenosis    lumbar   . Stroke ([place])   . Unspecified sleep apnea      Past Surgical History:  Procedure Laterality Date  . adenosine cardiolite  1/08   low risk   . APPENDECTOMY    . BREAST BIOPSY     02/28/11 negative  . BREAST LUMPECTOMY     right x1 ('89) left x2 ('70s, '90)  . CARDIAC CATHETERIZATION  2001   normal per pt  . COLONOSCOPY  11/02   diverticulosis  . COLONOSCOPY  9/06   diverticulosis, hemorhoids  . COLONOSCOPY N/A 10/01/2016   Procedure: COLONOSCOPY;  Surgeon: Gatha Mayer, MD;  Location: [facility];  Service: Endoscopy;  Laterality: N/A;  . dexa  3/02   normal  . EYE SURGERY  2012   cataract  . IR CT HEAD LTD  09/01/2018  . IR PERCUTANEOUS ART THROMBECTOMY/INFUSION INTRACRANIAL INC DIAG ANGIO  09/01/2018  . JOINT REPLACEMENT     knee b/l 2011  . knee replacement Right 9/11   R. Dr. Telford Nab  . RADIOLOGY WITH ANESTHESIA N/A 09/01/2018   Procedure: IR WITH ANESTHESIA;  Surgeon: Luanne Bras, MD;  Location: [facility];  Service: Radiology;  Laterality: N/A;  . sleep study  5/05  . TONSILLECTOMY    . TOTAL ABDOMINAL HYSTERECTOMY  fibroids, age of 67  . TOTAL KNEE ARTHROPLASTY Left 2012  . US TRANSVAGINAL PELVIC MODIFIED  2000, 2001    Current Medications: Current Meds  Medication Sig  . allopurinol (ZYLOPRIM) 100 MG tablet TAKE 2 TABLETS BY MOUTH ONCE A DAY *NEED OFFICE VISIT  . ALPRAZolam (XANAX) 0.25 MG tablet Take 1 tablet (0.25 mg total) by mouth 2 (two) times daily as needed for anxiety.  Marland Kitchen amLODipine (NORVASC) 5 MG tablet Take 1 tablet (5 mg total) by mouth daily.  . colchicine 0.6 MG tablet Take 1 tablet (0.6 mg total) by mouth daily.  . DULoxetine (CYMBALTA) 30 MG capsule Take 30 mg by mouth daily.  Marland Kitchen ELIQUIS 5 MG TABS tablet TAKE 1 TABLET BY MOUTH TWICE A DAY  . furosemide (LASIX) 40 MG tablet Take 1 tablet (40 mg total) by mouth daily.  Marland Kitchen gabapentin (NEURONTIN) 300 MG capsule Take 1 capsule (300 mg total) by mouth 3 (three) times daily.  Marland Kitchen HYDROcodone-acetaminophen (NORCO/VICODIN) 5-325 MG tablet Take 1  tablet by mouth 2 (two) times daily as needed for moderate pain.  . Magnesium 250 MG TABS Take 250 mg by mouth daily.  . metoprolol succinate (TOPROL-XL) 100 MG 24 hr tablet TAKE 1 TABLET BY MOUTH DAILY WITH OR IMMEDIATLY FOLLOWING A MEAL  . pantoprazole (PROTONIX) 40 MG tablet Take 1 tablet (40 mg total) by mouth daily.  . potassium chloride SA (KLOR-CON) 20 MEQ tablet Take 1 tablet (20 mEq total) by mouth daily.  . pravastatin (PRAVACHOL) 20 MG tablet Take 1 tablet (20 mg total) by mouth at bedtime.  Marland Kitchen spironolactone (ALDACTONE) 25 MG tablet Take 0.5 tablets (12.5 mg total) by mouth daily.     Allergies:   Buspirone hcl, Lisinopril, and Prozac [fluoxetine hcl]   Social History   Socioeconomic History  . Marital status: Widowed    Spouse name: Not on file  . Number of children: 1  . Years of education: Not on file  . Highest education level: Not on file  Occupational History  . Occupation: Retired Ship broker: RETIRED  Tobacco Use  . Smoking status: Former Smoker    Packs/day: 0.50    Years: 20.00    Pack years: 10.00    Types: Cigarettes  . Smokeless tobacco: Never Used  . Tobacco comment: quit approx. 20 years ago  Substance and Sexual Activity  . Alcohol use: Yes    Alcohol/week: 0.0 standard drinks    Comment: wine occasional  . Drug use: No  . Sexual activity: Never  Other Topics Concern  . Not on file  Social History Narrative   Widowed x 30+ years as of 12/2018    1 daughter. 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 Difficulty of Paying Living Expenses: Not on file  Food Insecurity:   . Worried About Charity fundraiser in the Last Year: Not on file  . Ran Out of Food in the Last Year: Not on file  Transportation Needs:   . Lack of Transportation (Medical): Not on file  . Lack of Transportation (Non-Medical): Not on  file  Physical Activity:   . Days of Exercise per Week: Not on file  . Minutes of Exercise per Session: Not on file  Stress:   . Feeling of Stress : Not on file  Social Connections:   .  Frequency of Communication with Friends and Family: Not on file  . Frequency of Social Gatherings with Friends and Family: Not on file  . Attends Religious Services: Not on file  . Active Member of Clubs or Organizations: Not on file  . Attends Archivist Meetings: Not on file  . Marital Status: Not on file     Family History: The patient's family history includes Breast cancer in her sister; Cancer in her brother and sister; Drug abuse in her brother; Gout in an other family member; Heart failure in her mother; Hepatitis in her brother; Hypertension in her mother; Other in her brother; Prostate cancer in her brother; Stroke in her father.  ROS:   Please see the history of present illness.    Recurrent nosebleeds with the most recent one requiring nasal packing in the emergency department.  Both seem to be precipitated by trauma.  All other systems reviewed and are negative.  EKGs/Labs/Other Studies Reviewed:    The following studies were reviewed today: [place] 01/2019:   Normal sinus rhythm without AF or AFl.  EKG:  EKG not performed.  The recent 30-day monitor as noted above.  Recent Labs: 09/04/2018: TSH 2.233 09/11/2018: NT-Pro BNP 536 10/17/2018: Hemoglobin 10.9; Platelets 359 12/13/2018: ALT 13 01/21/2019: BUN 22; Creatinine, Ser 1.04; Potassium 4.6; Sodium 139  Recent Lipid Panel    Component Value Date/Time   CHOL 194 12/26/2018 1146   TRIG 74.0 12/26/2018 1146   HDL 47.30 12/26/2018 1146   CHOLHDL 4 12/26/2018 1146   VLDL 14.8 12/26/2018 1146   LDLCALC 132 (H) 12/26/2018 1146   LDLDIRECT 131.8 12/09/2009 1643    Physical Exam:    VS:  BP 120/76   Ht 5\' 7"  (1.702 m)   Wt 240 lb 12.8 oz (109.2 kg)   BMI 37.71 kg/m     Wt Readings from Last 3 Encounters:    02/13/19 240 lb 12.8 oz (109.2 kg)  12/26/18 239 lb 9.6 oz (108.7 kg)  12/26/18 236 lb 12.8 oz (107.4 kg)     GEN: Moderate obesity. No acute distress HEENT: Normal NECK: No JVD. LYMPHATICS: No lymphadenopathy CARDIAC:  RRR without murmur, gallop, or edema. VASCULAR:  Normal Pulses. No bruits. RESPIRATORY:  Clear to auscultation without rales, wheezing or rhonchi  ABDOMEN: Soft, non-tender, non-distended, No pulsatile mass, MUSCULOSKELETAL: No deformity  SKIN: Warm and dry NEUROLOGIC:  Alert and oriented x 3 PSYCHIATRIC:  Normal affect   ASSESSMENT:    1. Essential hypertension   2. Atrial flutter, unspecified type ([place])   3. Hypertensive heart disease with heart failure ([place])   4. Chronic anticoagulation   5. Other hyperlipidemia   6. Educated about COVID-19 virus infection    PLAN:    In order of problems listed above:  1. Blood pressure is doing quite well today. 2. No recurrence off of amiodarone 3. Blood pressure is doing quite well today.  No evidence of volume overload. 4. Continue Eliquis 5 mg twice daily.  Hemoglobin and creatinine in 6 months. 5. Lipids will need to be treated.  Pravastatin was recently started by primary Dr. Tressia Miners. 6. 3W's is discussed and is being employed to prevent COVID-19 infection.  Clinical follow-up in 6 months.   Medication Adjustments/Labs and Tests Ordered: Current medicines are reviewed at length with the patient today.  Concerns regarding medicines are outlined above.  No orders of the defined types were placed in this encounter.  No orders of the defined types were  placed in this encounter.   Patient Instructions  Medication Instructions:  Your physician recommends that you continue on your current medications as directed. Please refer to the Current Medication list given to you today.  *If you need a refill on your cardiac medications before your next appointment, please call your pharmacy*  Lab Work: None If you  have labs (blood work) drawn today and your tests are completely normal, you will receive your results only by: Marland Kitchen MyChart Message (if you have MyChart) OR . A paper copy in the mail If you have any lab test that is abnormal or we need to change your treatment, we will call you to review the results.  Testing/Procedures: None  Follow-Up: At [facility], you and your health needs are our priority.  As part of our continuing mission to provide you with exceptional heart care, we have created designated Provider Care Teams.  These Care Teams include your primary Cardiologist (physician) and Advanced Practice Providers (APPs -  Physician Assistants and Nurse Practitioners) who all work together to provide you with the care you need, when you need it.  Your next appointment:   6 month(s)  The format for your next appointment:   In Person  Provider:   You may see Sinclair Grooms, MD or one of the following Advanced Practice Providers on your designated Care Team:    Truitt Merle, NP  Cecilie Kicks, NP  Kathyrn Drown, NP   Other Instructions      Signed, Sinclair Grooms, MD  02/13/2019 4:01 PM    [place]

## 2019-02-13 ENCOUNTER — Other Ambulatory Visit: Payer: Self-pay

## 2019-02-13 ENCOUNTER — Ambulatory Visit: Payer: Medicare Other | Admitting: Interventional Cardiology

## 2019-02-13 ENCOUNTER — Encounter: Payer: Self-pay | Admitting: Interventional Cardiology

## 2019-02-13 VITALS — BP 120/76 | Ht 67.0 in | Wt 240.8 lb

## 2019-02-13 DIAGNOSIS — I110 Hypertensive heart disease with heart failure: Secondary | ICD-10-CM | POA: Diagnosis not present

## 2019-02-13 DIAGNOSIS — I10 Essential (primary) hypertension: Secondary | ICD-10-CM

## 2019-02-13 DIAGNOSIS — E7849 Other hyperlipidemia: Secondary | ICD-10-CM

## 2019-02-13 DIAGNOSIS — Z7901 Long term (current) use of anticoagulants: Secondary | ICD-10-CM

## 2019-02-13 DIAGNOSIS — I4892 Unspecified atrial flutter: Secondary | ICD-10-CM | POA: Diagnosis not present

## 2019-02-13 DIAGNOSIS — Z7189 Other specified counseling: Secondary | ICD-10-CM

## 2019-02-13 DIAGNOSIS — I5032 Chronic diastolic (congestive) heart failure: Secondary | ICD-10-CM

## 2019-02-13 DIAGNOSIS — Z8673 Personal history of transient ischemic attack (TIA), and cerebral infarction without residual deficits: Secondary | ICD-10-CM

## 2019-02-13 DIAGNOSIS — R7303 Prediabetes: Secondary | ICD-10-CM

## 2019-02-13 NOTE — Patient Instructions (Signed)

## 2019-02-25 ENCOUNTER — Telehealth: Payer: Self-pay | Admitting: Internal Medicine

## 2019-02-25 ENCOUNTER — Other Ambulatory Visit: Payer: Self-pay | Admitting: Internal Medicine

## 2019-02-25 DIAGNOSIS — Z1231 Encounter for screening mammogram for malignant neoplasm of breast: Secondary | ICD-10-CM

## 2019-02-25 NOTE — Telephone Encounter (Signed)
Spoke to pt-she was confused on referrals because her daughter was in [place] when she was last seen by Dr. Olivia Mackie and she does not have a good memory. She was confused as far as the CT and what to pick up prior to her appointment which is why she cancelled her appointment.  I clarified that she would need to pick up her prep kit (contrast drink) one day prior to this. She requested that her daughter call them to reschedule this, but changed her mind and wants to come in to see Dr. Olivia Mackie on her scheduled appointment before moving forward with anything because her daughter will be with her this time. Confused on her mammogram. Diagnostic mammogram ordered but states that she is not having any breast issue's. Last mammo @ [facility] 02/2018 states that she needs a screening mammogram. Informed her that I would need to clarify with Dr. Olivia Mackie regarding this.

## 2019-02-25 NOTE — Telephone Encounter (Signed)
I ordered diagnostic but put screening to [facility] want her to have screening at [facility] not [facility] in [place] b/c they dont have 3d only in [place]   Can you schedule for pt after 03/02/19?   Will disc CT ab/pelvis at f/u appt 03/06/19 and if memory is an issue daughter needs to be present or called for all visits   Arnold

## 2019-02-25 NOTE — Telephone Encounter (Signed)
Pt called, she needs to help on some referrals.

## 2019-02-25 NOTE — Addendum Note (Signed)
Addended by: Orland Mustard on: 02/25/2019 04:46 PM   Modules accepted: Orders

## 2019-02-25 NOTE — Telephone Encounter (Signed)
PT went to her pharmacy to pick up  Medications. Some of her medications still have the previous provider's name on bottle. Pt's pharmacy told her to call current provider on the follow ing medications; allopurinol (ZYLOPRIM) 100 MG tablet, metoprolol succinate (TOPROL-XL) 100 MG 24 hr tablet, potassium chloride SA (KLOR-CON) 20 MEQ tablet, DULoxetine (CYMBALTA) 30 MG capsule, gabapentin (NEURONTIN) 300 MG capsule.

## 2019-02-27 ENCOUNTER — Other Ambulatory Visit: Payer: Self-pay | Admitting: Internal Medicine

## 2019-02-27 DIAGNOSIS — M109 Gout, unspecified: Secondary | ICD-10-CM

## 2019-02-27 DIAGNOSIS — E876 Hypokalemia: Secondary | ICD-10-CM

## 2019-02-27 DIAGNOSIS — G8929 Other chronic pain: Secondary | ICD-10-CM

## 2019-02-27 DIAGNOSIS — I10 Essential (primary) hypertension: Secondary | ICD-10-CM

## 2019-02-27 MED ORDER — DULOXETINE HCL 30 MG PO CPEP: 30.0000 mg | ORAL_CAPSULE | Freq: Every day | ORAL | 3 refills | Status: DC

## 2019-02-27 MED ORDER — ALLOPURINOL 100 MG PO TABS: ORAL_TABLET | ORAL | 3 refills | Status: DC

## 2019-02-27 MED ORDER — GABAPENTIN 300 MG PO CAPS: 300.0000 mg | ORAL_CAPSULE | Freq: Three times a day (TID) | ORAL | 3 refills | Status: DC

## 2019-02-27 MED ORDER — POTASSIUM CHLORIDE CRYS ER 10 MEQ PO TBCR: 10.0000 meq | EXTENDED_RELEASE_TABLET | Freq: Every day | ORAL | 3 refills | Status: DC

## 2019-02-27 MED ORDER — METOPROLOL SUCCINATE ER 100 MG PO TB24: ORAL_TABLET | ORAL | 3 refills | Status: DC

## 2019-02-27 NOTE — Telephone Encounter (Signed)
Sent reduced potassium dose 20 to 10 she does not need so much and may not need at all will monitor  Inform pt   Monica [name]

## 2019-03-06 ENCOUNTER — Ambulatory Visit (INDEPENDENT_AMBULATORY_CARE_PROVIDER_SITE_OTHER): Payer: Medicare HMO | Admitting: Internal Medicine

## 2019-03-06 ENCOUNTER — Encounter: Payer: Self-pay | Admitting: Internal Medicine

## 2019-03-06 ENCOUNTER — Other Ambulatory Visit: Payer: Self-pay

## 2019-03-06 VITALS — BP 154/80 | Ht 67.0 in | Wt 240.0 lb

## 2019-03-06 DIAGNOSIS — M545 Low back pain, unspecified: Secondary | ICD-10-CM

## 2019-03-06 DIAGNOSIS — R413 Other amnesia: Secondary | ICD-10-CM

## 2019-03-06 DIAGNOSIS — F418 Other specified anxiety disorders: Secondary | ICD-10-CM

## 2019-03-06 DIAGNOSIS — I639 Cerebral infarction, unspecified: Secondary | ICD-10-CM

## 2019-03-06 DIAGNOSIS — M4800 Spinal stenosis, site unspecified: Secondary | ICD-10-CM | POA: Diagnosis not present

## 2019-03-06 DIAGNOSIS — G8929 Other chronic pain: Secondary | ICD-10-CM

## 2019-03-06 DIAGNOSIS — L659 Nonscarring hair loss, unspecified: Secondary | ICD-10-CM | POA: Diagnosis not present

## 2019-03-06 DIAGNOSIS — I69311 Memory deficit following cerebral infarction: Secondary | ICD-10-CM

## 2019-03-06 DIAGNOSIS — D509 Iron deficiency anemia, unspecified: Secondary | ICD-10-CM

## 2019-03-06 DIAGNOSIS — I10 Essential (primary) hypertension: Secondary | ICD-10-CM | POA: Diagnosis not present

## 2019-03-06 HISTORY — DX: Other amnesia: R41.3

## 2019-03-06 MED ORDER — GABAPENTIN 300 MG PO CAPS: 300.0000 mg | ORAL_CAPSULE | Freq: Two times a day (BID) | ORAL | 3 refills | Status: DC

## 2019-03-06 NOTE — Progress Notes (Signed)
Telephone Note  I connected with Monica [name]   on 03/06/19 at  3:48 PM EST by telephone and verified that I am speaking with the correct person using two identifiers.  Location patient: home Location provider:work or home office Persons participating in the virtual visit: patient, provider, pts daughter Monica [name] at home on speaker phone  I discussed the limitations of evaluation and management by telemedicine and the availability of in person appointments. The patient expressed understanding and agreed to proceed.   HPI: 1. HTN BP elevated today on spironolactone 12.5 mg qam, lasix 40 mg qd, toprol 100 mg xl qd, norvasc 5 mg qd  2. H/o stroke 08/2018 and since this time daughter has noticed more recently memory loss, confusion I.e forgetting the pin # to her debit card and phone #s and names. Her daughter is back and forth from [place] to [place]. Pt has also been crying more and more depressed due to current events on cymbalta 30 mg qd  3. Chronic back pain her back is better on gabapentin 300 mg bid not tid and cymbalta 30 mg qd. She had meeting with neurosurgery Dr. Loretha Stapler due to MRI 11/2018 severe spinal stenosis and herniated disc 2 months ago he wanted to do back surgery she thinks currently back pain is better and does not want to proceed with surgery.  4. H/o abdominal pain chronic mild to moderate and intermittent she has pending CT ab/pelvis 03/19/19 which was sch last visit  5. C/o hair loss she does have anemia, iron def.  Results for Monica [name], Monica [name] (MRN [id number]) as of 03/06/2019 17:49  Ref. Range 12/26/2018 11:46  Iron Latest Ref Range: 45 - 160 mcg/dL 41 (L)  TIBC Latest Ref Range: 250 - 450 mcg/dL (calc) 337  %SAT Latest Ref Range: 16 - 45 % (calc) 12 (L)  Ferritin Latest Ref Range: 16 - 288 ng/mL 14 (L)   ROS: See pertinent positives and negatives per HPI.  Past Medical History:  Diagnosis Date  . Allergic rhinitis   . Anxiety   . Benign neoplasm of breast 2013  .  Cardiac arrhythmia   . Cardiomegaly   . Cataract   . Compulsive eating patterns   . Compulsive overeating   . Depression   . Diastolic heart failure (HCC)    LVH with free wall thickness 1.4 cm EF 60% Dr. Tamala Julian   . Diffuse cystic mastopathy 2013  . Edema   . Epistaxis    noted 12/20/18   . GI bleeding    in ~2018   . Gout   . Hiatal hernia   . HTN (hypertension)   . Hyperlipidemia   . Lump or mass in breast 2012  . OA (osteoarthritis) of knee    with injections  . Obesity   . OSA (obstructive sleep apnea)    not on cpap  . Other abnormal glucose   . Peptic stricture of esophagus   . Personal history of tobacco use, presenting hazards to health   . Sciatica   . Sinus infection 2010  . Spinal stenosis    lumbar   . Stroke ([place])   . Unspecified sleep apnea     Past Surgical History:  Procedure Laterality Date  . adenosine cardiolite  1/08   low risk   . APPENDECTOMY    . BREAST BIOPSY     02/28/11 negative  . BREAST LUMPECTOMY     right x1 ('89) left x2 ('70s, '90)  . CARDIAC CATHETERIZATION  2001   normal per pt  . COLONOSCOPY  11/02   diverticulosis  . COLONOSCOPY  9/06   diverticulosis, hemorhoids  . COLONOSCOPY N/A 10/01/2016   Procedure: COLONOSCOPY;  Surgeon: Gatha Mayer, MD;  [facility];  Service: Endoscopy;  Laterality: N/A;  . dexa  3/02   normal  . EYE SURGERY  2012   cataract  . IR CT HEAD LTD  09/01/2018  . IR PERCUTANEOUS ART THROMBECTOMY/INFUSION INTRACRANIAL INC DIAG ANGIO  09/01/2018  . JOINT REPLACEMENT     knee b/l 2011  . knee replacement Right 9/11   R. Dr. Telford Nab  . RADIOLOGY WITH ANESTHESIA N/A 09/01/2018   Procedure: IR WITH ANESTHESIA;  Surgeon: Luanne Bras, MD;  Location: [facility];  Service: Radiology;  Laterality: N/A;  . sleep study  5/05  . TONSILLECTOMY    . TOTAL ABDOMINAL HYSTERECTOMY     fibroids, age of 54  . TOTAL KNEE ARTHROPLASTY Left 2012  . US TRANSVAGINAL PELVIC MODIFIED  2000, 2001    Family  History  Problem Relation Age of Onset  . Stroke Father   . Hypertension Mother        (a lot of animosity in their relationship)  . Heart failure Mother   . Gout Other        whole family   . Prostate cancer Brother   . Cancer Brother        prostate   . Hepatitis Brother        Hep C; alcoholism (terminal)  . Drug abuse Brother        died 17  . Breast cancer Sister   . Cancer Sister        breast cancer  . Other Brother        drug addiction    SOCIAL HX: lives at home  Daughter Monica [name] lives back and forth [place] to [place]    Current Outpatient Medications:  .  allopurinol (ZYLOPRIM) 100 MG tablet, TAKE 2 TABLETS BY MOUTH ONCE A DAY, Disp: 180 tablet, Rfl: 3 .  ALPRAZolam (XANAX) 0.25 MG tablet, Take 1 tablet (0.25 mg total) by mouth 2 (two) times daily as needed for anxiety., Disp: 25 tablet, Rfl: 0 .  amLODipine (NORVASC) 5 MG tablet, Take 1 tablet (5 mg total) by mouth daily., Disp: 180 tablet, Rfl: 3 .  colchicine 0.6 MG tablet, Take 1 tablet (0.6 mg total) by mouth daily., Disp:  , Rfl:  .  DULoxetine (CYMBALTA) 30 MG capsule, Take 1 capsule (30 mg total) by mouth daily., Disp: 90 capsule, Rfl: 3 .  ELIQUIS 5 MG TABS tablet, TAKE 1 TABLET BY MOUTH TWICE A DAY, Disp: 60 tablet, Rfl: 10 .  furosemide (LASIX) 40 MG tablet, Take 1 tablet (40 mg total) by mouth daily., Disp: 90 tablet, Rfl: 3 .  gabapentin (NEURONTIN) 300 MG capsule, Take 1 capsule (300 mg total) by mouth 2 (two) times daily., Disp: 180 capsule, Rfl: 3 .  HYDROcodone-acetaminophen (NORCO/VICODIN) 5-325 MG tablet, Take 1 tablet by mouth 2 (two) times daily as needed for moderate pain., Disp: 20 tablet, Rfl: 0 .  Magnesium 250 MG TABS, Take 250 mg by mouth daily., Disp: , Rfl:  .  metoprolol succinate (TOPROL-XL) 100 MG 24 hr tablet, TAKE 1 TABLET BY MOUTH DAILY WITH OR IMMEDIATLY FOLLOWING A MEAL, Disp: 90 tablet, Rfl: 3 .  pantoprazole (PROTONIX) 40 MG tablet, Take 1 tablet (40 mg total) by mouth daily.,  Disp:  ,  Rfl:  .  potassium chloride SA (KLOR-CON) 10 MEQ tablet, Take 1 tablet (10 mEq total) by mouth daily., Disp: 90 tablet, Rfl: 3 .  pravastatin (PRAVACHOL) 20 MG tablet, Take 1 tablet (20 mg total) by mouth at bedtime., Disp: 90 tablet, Rfl: 3 .  spironolactone (ALDACTONE) 25 MG tablet, Take 0.5 tablets (12.5 mg total) by mouth daily., Disp: 45 tablet, Rfl: 3  EXAM:  VITALS per patient if applicable:  GENERAL: alert, oriented, appears well and in no acute distress  PSYCH/NEURO: pleasant and cooperative, no obvious depression or anxiety, speech and thought processing grossly intact  ASSESSMENT AND PLAN:  Discussed the following assessment and plan:  Depression with anxiety, memory loss s/p stroke 08/2018- Plan: Ambulatory referral to Psychiatry Refer [facility] to consider medication changes/tx  Cont cymbalta 30 mg for now will not increase dose due to BEERS criteria concern with age F/u neurology as scheduled needs further testing of memory as well.  Essential hypertension  Spinal stenosis severe with herniated disc MRI 11/2018 and chronic back pain  -gabapentin 300 mg bid and cymbalta 30 mg helping  No need for surgery currently Dr. Loretha Stapler is NS  She asks about cbd for chronic pain but I am unable to comment on this she has not used just heard about this   Iron deficiency anemia, unspecified iron deficiency anemia type -rec mvt with iron   Hair loss  rec mvt with iron  giovannis tea tree shampoo and rogaine otc   HM Flu shot utd 12/05/18 prevnar utd  pna 23 due had 12/03/13  Tdap per prior PCP had 12/19/16  consider shingrix  in future   Out of age window pap s/p hysterectomy ? What is left in I.e ovaries possibly b/l ovaries out  Mammogram 02/28/2018 negative ordered [place] pt can call to schedule  Colonoscopy 10/08/16 negative Dr. Carlean Purl  dexa 03/05/15 normal  Former smoker age 37-40 max 1ppd   Of note h/o anxiety depression PCP prior was going  to start lexapro or celexa she is currently on cymbalta  Prior PCPs Dr. Lacinda Axon and Dr. Delfina Redwood in [facility]  Cards Dr. Tamala Julian Consider ob/gyn referral in future will 1st do CT ab/pelvis to w/u female pelvic pain disc Dr. Garwin Brothers referral   -we discussed possible serious and likely etiologies, options for evaluation and workup, limitations of telemedicine visit vs in person visit, treatment, treatment risks and precautions. Pt prefers to treat via telemedicine empirically rather then risking or undertaking an in person visit at this moment. Patient agrees to seek prompt in person care if worsening, new symptoms arise, or if is not improving with treatment.   I discussed the assessment and treatment plan with the patient. The patient was provided an opportunity to ask questions and all were answered. The patient agreed with the plan and demonstrated an understanding of the instructions.   The patient was advised to call back or seek an in-person evaluation if the symptoms worsen or if the condition fails to improve as anticipated.  Time spent 30 minutes  Delorise Jackson, MD

## 2019-03-06 NOTE — Patient Instructions (Addendum)
Hair loss I would recommend a multivitamin with iron daily I.e centrum, nature made or garden of life  Vitamin D3 additional 2000 IU daily over the counter  giovannis tea tree shampoo and/or womens rogaine    Monitor your blood pressure the goal should be 130 to <140/<80   If not call the office .    Take care    Low-Sodium Eating Plan Sodium, which is an element that makes up salt, helps you maintain a healthy balance of fluids in your body. Too much sodium can increase your blood pressure and cause fluid and waste to be held in your body. Your health care provider or dietitian may recommend following this plan if you have high blood pressure (hypertension), kidney disease, liver disease, or heart failure. Eating less sodium can help lower your blood pressure, reduce swelling, and protect your heart, liver, and kidneys. What are tips for following this plan? General guidelines  Most people on this plan should limit their sodium intake to 1,500-2,000 mg (milligrams) of sodium each day. Reading food labels   The Nutrition Facts label lists the amount of sodium in one serving of the food. If you eat more than one serving, you must multiply the listed amount of sodium by the number of servings.  Choose foods with less than 140 mg of sodium per serving.  Avoid foods with 300 mg of sodium or more per serving. Shopping  Look for lower-sodium products, often labeled as "low-sodium" or "no salt added."  Always check the sodium content even if foods are labeled as "unsalted" or "no salt added".  Buy fresh foods. ? Avoid canned foods and premade or frozen meals. ? Avoid canned, cured, or processed meats  Buy breads that have less than 80 mg of sodium per slice. Cooking  Eat more home-cooked food and less restaurant, buffet, and fast food.  Avoid adding salt when cooking. Use salt-free seasonings or herbs instead of table salt or sea salt. Check with your health care provider or  pharmacist before using salt substitutes.  Cook with plant-based oils, such as canola, sunflower, or olive oil. Meal planning  When eating at a restaurant, ask that your food be prepared with less salt or no salt, if possible.  Avoid foods that contain MSG (monosodium glutamate). MSG is sometimes added to [place] food, bouillon, and some canned foods. What foods are recommended? The items listed may not be a complete list. Talk with your dietitian about what dietary choices are best for you. Grains Low-sodium cereals, including oats, puffed wheat and rice, and shredded wheat. Low-sodium crackers. Unsalted rice. Unsalted pasta. Low-sodium bread. Whole-grain breads and whole-grain pasta. Vegetables Fresh or frozen vegetables. "No salt added" canned vegetables. "No salt added" tomato sauce and paste. Low-sodium or reduced-sodium tomato and vegetable juice. Fruits Fresh, frozen, or canned fruit. Fruit juice. Meats and other protein foods Fresh or frozen (no salt added) meat, poultry, seafood, and fish. Low-sodium canned tuna and salmon. Unsalted nuts. Dried peas, beans, and lentils without added salt. Unsalted canned beans. Eggs. Unsalted nut butters. Dairy Milk. Soy milk. Cheese that is naturally low in sodium, such as ricotta cheese, fresh mozzarella, or Swiss cheese Low-sodium or reduced-sodium cheese. Cream cheese. Yogurt. Fats and oils Unsalted butter. Unsalted margarine with no trans fat. Vegetable oils such as canola or olive oils. Seasonings and other foods Fresh and dried herbs and spices. Salt-free seasonings. Low-sodium mustard and ketchup. Sodium-free salad dressing. Sodium-free light mayonnaise. Fresh or refrigerated horseradish. Lemon juice. Vinegar.  Homemade, reduced-sodium, or low-sodium soups. Unsalted popcorn and pretzels. Low-salt or salt-free chips. What foods are not recommended? The items listed may not be a complete list. Talk with your dietitian about what dietary choices  are best for you. Grains Instant hot cereals. Bread stuffing, pancake, and biscuit mixes. Croutons. Seasoned rice or pasta mixes. Noodle soup cups. Boxed or frozen macaroni and cheese. Regular salted crackers. Self-rising flour. Vegetables Sauerkraut, pickled vegetables, and relishes. Olives. [place] fries. Onion rings. Regular canned vegetables (not low-sodium or reduced-sodium). Regular canned tomato sauce and paste (not low-sodium or reduced-sodium). Regular tomato and vegetable juice (not low-sodium or reduced-sodium). Frozen vegetables in sauces. Meats and other protein foods Meat or fish that is salted, canned, smoked, spiced, or pickled. Bacon, ham, sausage, hotdogs, corned beef, chipped beef, packaged lunch meats, salt pork, jerky, pickled herring, anchovies, regular canned tuna, sardines, salted nuts. Dairy Processed cheese and cheese spreads. Cheese curds. Blue cheese. Feta cheese. String cheese. Regular cottage cheese. Buttermilk. Canned milk. Fats and oils Salted butter. Regular margarine. Ghee. Bacon fat. Seasonings and other foods Onion salt, garlic salt, seasoned salt, table salt, and sea salt. Canned and packaged gravies. Worcestershire sauce. Tartar sauce. Barbecue sauce. Teriyaki sauce. Soy sauce, including reduced-sodium. Steak sauce. Fish sauce. Oyster sauce. Cocktail sauce. Horseradish that you find on the shelf. Regular ketchup and mustard. Meat flavorings and tenderizers. Bouillon cubes. Hot sauce and Tabasco sauce. Premade or packaged marinades. Premade or packaged taco seasonings. Relishes. Regular salad dressings. Salsa. Potato and tortilla chips. Corn chips and puffs. Salted popcorn and pretzels. Canned or dried soups. Pizza. Frozen entrees and pot pies. Summary  Eating less sodium can help lower your blood pressure, reduce swelling, and protect your heart, liver, and kidneys.  Most people on this plan should limit their sodium intake to 1,500-2,000 mg (milligrams) of  sodium each day.  Canned, boxed, and frozen foods are high in sodium. Restaurant foods, fast foods, and pizza are also very high in sodium. You also get sodium by adding salt to food.  Try to cook at home, eat more fresh fruits and vegetables, and eat less fast food, canned, processed, or prepared foods. This information is not intended to replace advice given to you by your health care provider. Make sure you discuss any questions you have with your health care provider. Document Revised: 01/20/2017 Document Reviewed: 02/01/2016 Elsevier Patient Education  [place] DASH stands for "Dietary Approaches to Stop Hypertension." The DASH eating plan is a healthy eating plan that has been shown to reduce high blood pressure (hypertension). It may also reduce your risk for type 2 diabetes, heart disease, and stroke. The DASH eating plan may also help with weight loss. What are tips for following this plan?  General guidelines  Avoid eating more than 2,300 mg (milligrams) of salt (sodium) a day. If you have hypertension, you may need to reduce your sodium intake to 1,500 mg a day.  Limit alcohol intake to no more than 1 drink a day for nonpregnant women and 2 drinks a day for men. One drink equals 12 oz of beer, 5 oz of wine, or 1 oz of hard liquor.  Work with your health care provider to maintain a healthy body weight or to lose weight. Ask what an ideal weight is for you.  Get at least 30 minutes of exercise that causes your heart to beat faster (aerobic exercise) most days of the week. Activities may include walking, swimming, or biking.  Work with your health care provider or diet and nutrition specialist (dietitian) to adjust your eating plan to your individual calorie needs. Reading food labels   Check food labels for the amount of sodium per serving. Choose foods with less than 5 percent of the Daily Value of sodium. Generally, foods with less than 300 mg of  sodium per serving fit into this eating plan.  To find whole grains, look for the word "whole" as the first word in the ingredient list. Shopping  Buy products labeled as "low-sodium" or "no salt added."  Buy fresh foods. Avoid canned foods and premade or frozen meals. Cooking  Avoid adding salt when cooking. Use salt-free seasonings or herbs instead of table salt or sea salt. Check with your health care provider or pharmacist before using salt substitutes.  Do not fry foods. Cook foods using healthy methods such as baking, boiling, grilling, and broiling instead.  Cook with heart-healthy oils, such as olive, canola, soybean, or sunflower oil. Meal planning  Eat a balanced diet that includes: ? 5 or more servings of fruits and vegetables each day. At each meal, try to fill half of your plate with fruits and vegetables. ? Up to 6-8 servings of whole grains each day. ? Less than 6 oz of lean meat, poultry, or fish each day. A 3-oz serving of meat is about the same size as a deck of cards. One egg equals 1 oz. ? 2 servings of low-fat dairy each day. ? A serving of nuts, seeds, or beans 5 times each week. ? Heart-healthy fats. Healthy fats called Omega-3 fatty acids are found in foods such as flaxseeds and coldwater fish, like sardines, salmon, and mackerel.  Limit how much you eat of the following: ? Canned or prepackaged foods. ? Food that is high in trans fat, such as fried foods. ? Food that is high in saturated fat, such as fatty meat. ? Sweets, desserts, sugary drinks, and other foods with added sugar. ? Full-fat dairy products.  Do not salt foods before eating.  Try to eat at least 2 vegetarian meals each week.  Eat more home-cooked food and less restaurant, buffet, and fast food.  When eating at a restaurant, ask that your food be prepared with less salt or no salt, if possible. What foods are recommended? The items listed may not be a complete list. Talk with your  dietitian about what dietary choices are best for you. Grains Whole-grain or whole-wheat bread. Whole-grain or whole-wheat pasta. Brown rice. [place]. Bulgur. Whole-grain and low-sodium cereals. Pita bread. Low-fat, low-sodium crackers. Whole-wheat flour tortillas. Vegetables Fresh or frozen vegetables (raw, steamed, roasted, or grilled). Low-sodium or reduced-sodium tomato and vegetable juice. Low-sodium or reduced-sodium tomato sauce and tomato paste. Low-sodium or reduced-sodium canned vegetables. Fruits All fresh, dried, or frozen fruit. Canned fruit in natural juice (without added sugar). Meat and other protein foods Skinless chicken or [place]. Ground chicken or [place]. Pork with fat trimmed off. Fish and seafood. Egg whites. Dried beans, peas, or lentils. Unsalted nuts, nut butters, and seeds. Unsalted canned beans. Lean cuts of beef with fat trimmed off. Low-sodium, lean deli meat. Dairy Low-fat (1%) or fat-free (skim) milk. Fat-free, low-fat, or reduced-fat cheeses. Nonfat, low-sodium ricotta or cottage cheese. Low-fat or nonfat yogurt. Low-fat, low-sodium cheese. Fats and oils Soft margarine without trans fats. Vegetable oil. Low-fat, reduced-fat, or light mayonnaise and salad dressings (reduced-sodium). Canola, safflower, olive, soybean, and sunflower oils. Avocado. Seasoning and other foods Herbs. Spices. Seasoning mixes  without salt. Unsalted popcorn and pretzels. Fat-free sweets. What foods are not recommended? The items listed may not be a complete list. Talk with your dietitian about what dietary choices are best for you. Grains Baked goods made with fat, such as croissants, muffins, or some breads. Dry pasta or rice meal packs. Vegetables Creamed or fried vegetables. Vegetables in a cheese sauce. Regular canned vegetables (not low-sodium or reduced-sodium). Regular canned tomato sauce and paste (not low-sodium or reduced-sodium). Regular tomato and vegetable juice (not  low-sodium or reduced-sodium). [name]. Olives. Fruits Canned fruit in a light or heavy syrup. Fried fruit. Fruit in cream or butter sauce. Meat and other protein foods Fatty cuts of meat. Ribs. Fried meat. Berniece Salines. Sausage. Bologna and other processed lunch meats. Salami. Fatback. Hotdogs. Bratwurst. Salted nuts and seeds. Canned beans with added salt. Canned or smoked fish. Whole eggs or egg yolks. Chicken or [place] with skin. Dairy Whole or 2% milk, cream, and half-and-half. Whole or full-fat cream cheese. Whole-fat or sweetened yogurt. Full-fat cheese. Nondairy creamers. Whipped toppings. Processed cheese and cheese spreads. Fats and oils Butter. Stick margarine. Lard. Shortening. Ghee. Bacon fat. Tropical oils, such as coconut, palm kernel, or palm oil. Seasoning and other foods Salted popcorn and pretzels. Onion salt, garlic salt, seasoned salt, table salt, and sea salt. Worcestershire sauce. Tartar sauce. Barbecue sauce. Teriyaki sauce. Soy sauce, including reduced-sodium. Steak sauce. Canned and packaged gravies. Fish sauce. Oyster sauce. Cocktail sauce. Horseradish that you find on the shelf. Ketchup. Mustard. Meat flavorings and tenderizers. Bouillon cubes. Hot sauce and Tabasco sauce. Premade or packaged marinades. Premade or packaged taco seasonings. Relishes. Regular salad dressings. Where to find more information:  National Heart, Lung, and [place]: https://wilson-eaton.com/  American Heart Association: www.heart.org Summary  The DASH eating plan is a healthy eating plan that has been shown to reduce high blood pressure (hypertension). It may also reduce your risk for type 2 diabetes, heart disease, and stroke.  With the DASH eating plan, you should limit salt (sodium) intake to 2,300 mg a day. If you have hypertension, you may need to reduce your sodium intake to 1,500 mg a day.  When on the DASH eating plan, aim to eat more fresh fruits and vegetables, whole grains, lean proteins,  low-fat dairy, and heart-healthy fats.  Work with your health care provider or diet and nutrition specialist (dietitian) to adjust your eating plan to your individual calorie needs. This information is not intended to replace advice given to you by your health care provider. Make sure you discuss any questions you have with your health care provider. Document Revised: 01/20/2017 Document Reviewed: 02/01/2016 Elsevier Patient Education  [place].  Memory Compensation Strategies  4. Use "WARM" strategy.  W= write it down  A= associate it  R= repeat it  M= make a mental note  2.   You can keep a Social worker.  Use a 3-ring notebook with sections for the following: calendar, important names and phone numbers,  medications, doctors' names/phone numbers, lists/reminders, and a section to journal what you did  each day.   3.    Use a calendar to write appointments down.  4.    Write yourself a schedule for the day.  This can be placed on the calendar or in a separate section of the Memory Notebook.  Keeping a  regular schedule can help memory.  5.    Use medication organizer with sections for each day or morning/evening pills.  You may need  help loading it  6.    Keep a basket, or pegboard by the door.  Place items that you need to take out with you in the basket or on the pegboard.  You may also want to  include a message board for reminders.  7.    Use sticky notes.  Place sticky notes with reminders in a place where the task is performed.  For example: " turn off the  stove" placed by the stove, "lock the door" placed on the door at eye level, " take your medications" on  the bathroom mirror or by the place where you normally take your medications.  8.    Use alarms/timers.  Use while cooking to remind yourself to check on food or as a reminder to take your medicine, or as a  reminder to make a call, or as a reminder to perform another task, etc.

## 2019-03-12 ENCOUNTER — Encounter: Payer: Self-pay | Admitting: Internal Medicine

## 2019-03-19 ENCOUNTER — Ambulatory Visit
Admission: RE | Admit: 2019-03-19 | Discharge: 2019-03-19 | Disposition: A | Discharge status: Home / Self Care | Payer: Medicare HMO | Source: Ambulatory Visit | Attending: Internal Medicine | Admitting: Internal Medicine

## 2019-03-19 ENCOUNTER — Other Ambulatory Visit: Payer: Self-pay

## 2019-03-19 DIAGNOSIS — R102 Pelvic and perineal pain: Secondary | ICD-10-CM | POA: Diagnosis not present

## 2019-03-19 DIAGNOSIS — I82403 Acute embolism and thrombosis of unspecified deep veins of lower extremity, bilateral: Secondary | ICD-10-CM | POA: Diagnosis not present

## 2019-03-19 DIAGNOSIS — N281 Cyst of kidney, acquired: Secondary | ICD-10-CM | POA: Diagnosis not present

## 2019-03-19 DIAGNOSIS — R1084 Generalized abdominal pain: Secondary | ICD-10-CM | POA: Diagnosis not present

## 2019-03-19 HISTORY — DX: Heart failure, unspecified: I50.9

## 2019-03-19 LAB — POCT I-STAT CREATININE: Creatinine, Ser: 1 mg/dL (ref 0.44–1.00)

## 2019-03-19 MED ORDER — IOHEXOL 300 MG/ML  SOLN
100.0000 mL | Freq: Once | INTRAMUSCULAR | Status: AC | PRN
  Administered 2019-03-19: 100 mL via INTRAVENOUS

## 2019-03-30 ENCOUNTER — Other Ambulatory Visit: Payer: Self-pay | Admitting: Internal Medicine

## 2019-03-30 DIAGNOSIS — E785 Hyperlipidemia, unspecified: Secondary | ICD-10-CM

## 2019-03-30 DIAGNOSIS — I639 Cerebral infarction, unspecified: Secondary | ICD-10-CM

## 2019-04-05 ENCOUNTER — Ambulatory Visit
Admission: RE | Admit: 2019-04-05 | Discharge: 2019-04-05 | Disposition: A | Discharge status: Home / Self Care | Payer: Medicare HMO | Source: Ambulatory Visit | Attending: Internal Medicine | Admitting: Internal Medicine

## 2019-04-05 DIAGNOSIS — Z1231 Encounter for screening mammogram for malignant neoplasm of breast: Secondary | ICD-10-CM | POA: Diagnosis not present

## 2019-04-08 ENCOUNTER — Telehealth: Payer: Self-pay | Admitting: Internal Medicine

## 2019-04-08 ENCOUNTER — Other Ambulatory Visit: Payer: Self-pay | Admitting: Internal Medicine

## 2019-04-08 DIAGNOSIS — I639 Cerebral infarction, unspecified: Secondary | ICD-10-CM

## 2019-04-08 DIAGNOSIS — E785 Hyperlipidemia, unspecified: Secondary | ICD-10-CM

## 2019-04-08 MED ORDER — LOVASTATIN 20 MG PO TABS: 20.0000 mg | ORAL_TABLET | Freq: Every day | ORAL | 3 refills | Status: DC

## 2019-04-08 NOTE — Telephone Encounter (Signed)
lovastatin (MEVACOR) 20 MG tablet QS:[id number]   Order Details Dose, Route, Frequency: As Directed  Dispense Quantity: 90 tablet Refills: 0       Sig: Please specify directions, refills and quantity   Please advise

## 2019-04-08 NOTE — Telephone Encounter (Signed)
[facility] called, [employer] [phone]. Needs clarification on patinet's  lovastatin (MEVACOR) 20 MG tablet. Please call pharmacy.

## 2019-04-30 ENCOUNTER — Telehealth: Payer: Self-pay | Admitting: Internal Medicine

## 2019-04-30 NOTE — Telephone Encounter (Signed)
Left message for patient to call back and schedule Medicare Annual Wellness Visit (AWV) either virtually or audio only.  Last AWV [id number]; please schedule at anytime with Denisa O'Brien-Blaney at [facility].

## 2019-05-06 ENCOUNTER — Encounter: Payer: Self-pay | Admitting: Emergency Medicine

## 2019-05-06 ENCOUNTER — Other Ambulatory Visit: Payer: Self-pay

## 2019-05-06 ENCOUNTER — Ambulatory Visit
Admission: EM | Admit: 2019-05-06 | Discharge: 2019-05-06 | Disposition: A | Discharge status: Home / Self Care | Payer: Medicare HMO | Attending: Emergency Medicine | Admitting: Emergency Medicine

## 2019-05-06 DIAGNOSIS — L0291 Cutaneous abscess, unspecified: Secondary | ICD-10-CM | POA: Diagnosis not present

## 2019-05-06 LAB — POCT URINALYSIS DIP (MANUAL ENTRY)
Bilirubin, UA: NEGATIVE
Blood, UA: NEGATIVE
Glucose, UA: NEGATIVE mg/dL
Leukocytes, UA: NEGATIVE
Nitrite, UA: NEGATIVE
Protein Ur, POC: NEGATIVE mg/dL
Spec Grav, UA: 1.025 (ref 1.010–1.025)
Urobilinogen, UA: 0.2 E.U./dL
pH, UA: 7 (ref 5.0–8.0)

## 2019-05-06 MED ORDER — DOXYCYCLINE HYCLATE 100 MG PO CAPS: 100.0000 mg | ORAL_CAPSULE | Freq: Two times a day (BID) | ORAL | 0 refills | Status: AC

## 2019-05-06 NOTE — Discharge Instructions (Addendum)
Take the antibiotic as directed.  Encourage drainage from the abscess.    Keep the area clean and dry.  Wash it gently twice a day with soap and water.    Return here or follow up with your primary care provider if you see signs of worsening infection, such as increased pain, redness, warmth, fever, chills, or other concerning symptoms.

## 2019-05-06 NOTE — ED Triage Notes (Signed)
Patient in today c/o swelling in the labia area x 3 days. Patient states it was tinged with blood yesterday. Patient does states she is having urinary frequency. Patient denies fever. Patient has not tried any OTC medication.

## 2019-05-06 NOTE — ED Triage Notes (Signed)
Patient denies vaginal discharge.

## 2019-05-06 NOTE — ED Provider Notes (Signed)
Monica [name]    CSN: [id number] Arrival date & time: 05/06/19  1423      History   Chief Complaint Chief Complaint  Patient presents with  . swelling of labia    HPI Monica [name] is a 84 y.o. female.   Patient presents with 3-day history of painful swelling in the right labia.  She reports the area has scant bloody drainage.  She also reports urinary frequency.  No treatments attempted at home.  Patient denies fever, chills, abdominal pain, vaginal discharge, pelvic pain, or other symptoms.    The history is provided by the patient.    Past Medical History:  Diagnosis Date  . Allergic rhinitis   . Anxiety   . Benign neoplasm of breast 2013  . Cardiac arrhythmia   . Cardiomegaly   . Cataract   . CHF (congestive heart failure) ([place])   . Compulsive eating patterns   . Compulsive overeating   . Depression   . Diastolic heart failure (HCC)    LVH with free wall thickness 1.4 cm EF 60% Dr. Tamala Julian   . Diffuse cystic mastopathy 2013  . Edema   . Epistaxis    noted 12/20/18   . GI bleeding    in ~2018   . Gout   . Hiatal hernia   . HTN (hypertension)   . Hyperlipidemia   . Lump or mass in breast 2012  . OA (osteoarthritis) of knee    with injections  . Obesity   . OSA (obstructive sleep apnea)    not on cpap  . Other abnormal glucose   . Peptic stricture of esophagus   . Personal history of tobacco use, presenting hazards to health   . Sciatica   . Sinus infection 2010  . Spinal stenosis    lumbar   . Stroke ([place])   . Unspecified sleep apnea     Patient Active Problem List   Diagnosis Date Noted  . Memory loss 03/06/2019  . Iron deficiency anemia 01/03/2019  . Generalized abdominal pain 01/03/2019  . Spinal stenosis   . Vitamin D deficiency 12/26/2018  . Leg DVT (deep venous thromboembolism), acute, bilateral ([place]) 12/26/2018  . Chronic back pain 12/26/2018  . Stroke (cerebrum) ([place]) 09/01/2018  . Middle cerebral artery embolism, left  09/01/2018  . Degenerative lumbar spinal stenosis 09/20/2017  . Lumbar radiculopathy 07/31/2017  . Lower GI bleed 10/03/2016  . Diverticulosis of colon with hemorrhage   . Status post bilateral knee replacements 05/04/2016  . Chronic cough 04/15/2016  . Seborrheic keratoses 04/15/2016  . Cervical disc disorder with radiculopathy of cervical region 12/01/2015  . Partial nontraumatic rupture of right rotator cuff 09/22/2015  . Compulsive overeating 01/01/2015  . Chronic diastolic heart failure (HCC) 12/12/2012    Class: Chronic  . Urge incontinence of urine 09/09/2011  . Knee osteoarthritis 02/09/2010  . Gout 03/20/2008  . Hyperlipidemia 12/21/2006  . Depression with anxiety 12/21/2006  . Essential hypertension 12/21/2006  . Obstructive sleep apnea 12/21/2006    Past Surgical History:  Procedure Laterality Date  . adenosine cardiolite  1/08   low risk   . APPENDECTOMY    . BREAST BIOPSY     02/28/11 negative  . BREAST LUMPECTOMY     right x1 ('89) left x2 ('70s, '90)  . CARDIAC CATHETERIZATION  2001   normal per pt  . COLONOSCOPY  11/02   diverticulosis  . COLONOSCOPY  9/06   diverticulosis, hemorhoids  . COLONOSCOPY N/A  10/01/2016   Procedure: COLONOSCOPY;  Surgeon: Gatha Mayer, MD;  Location: [facility];  Service: Endoscopy;  Laterality: N/A;  . dexa  3/02   normal  . EYE SURGERY  2012   cataract  . IR CT HEAD LTD  09/01/2018  . IR PERCUTANEOUS ART THROMBECTOMY/INFUSION INTRACRANIAL INC DIAG ANGIO  09/01/2018  . JOINT REPLACEMENT     knee b/l 2011  . knee replacement Right 9/11   R. Dr. Telford Nab  . RADIOLOGY WITH ANESTHESIA N/A 09/01/2018   Procedure: IR WITH ANESTHESIA;  Surgeon: Luanne Bras, MD;  Location: [facility];  Service: Radiology;  Laterality: N/A;  . sleep study  5/05  . TONSILLECTOMY    . TOTAL ABDOMINAL HYSTERECTOMY     fibroids, age of 79  . TOTAL KNEE ARTHROPLASTY Left 2012  . US TRANSVAGINAL PELVIC MODIFIED  2000, 2001    OB History     Gravida  2   Para      Term      Preterm      AB  1   Living  1     SAB  1   TAB      Ectopic      Multiple      Live Births           Obstetric Comments  Age with first menstruation-14 Age with first pregnancy-22 LMP-age 70, hysterectomy         Home Medications    Prior to Admission medications   Medication Sig Start Date End Date Taking? Authorizing Provider  allopurinol (ZYLOPRIM) 100 MG tablet TAKE 2 TABLETS BY MOUTH ONCE A DAY 02/27/19  Yes McLean-Scocuzza, Nino Glow, MD  ALPRAZolam Duanne Moron) 0.25 MG tablet Take 1 tablet (0.25 mg total) by mouth 2 (two) times daily as needed for anxiety. 12/04/18  Yes Belva Crome, MD  amLODipine (NORVASC) 5 MG tablet Take 1 tablet (5 mg total) by mouth daily. 12/04/18 05/06/19 Yes Belva Crome, MD  colchicine 0.6 MG tablet Take 1 tablet (0.6 mg total) by mouth daily. 12/31/18  Yes McLean-Scocuzza, Nino Glow, MD  DULoxetine (CYMBALTA) 30 MG capsule Take 1 capsule (30 mg total) by mouth daily. 02/27/19  Yes McLean-Scocuzza, Nino Glow, MD  ELIQUIS 5 MG TABS tablet TAKE 1 TABLET BY MOUTH TWICE A DAY 02/04/19  Yes Belva Crome, MD  furosemide (LASIX) 40 MG tablet Take 1 tablet (40 mg total) by mouth daily. 12/04/18  Yes Belva Crome, MD  gabapentin (NEURONTIN) 300 MG capsule Take 1 capsule (300 mg total) by mouth 2 (two) times daily. 03/06/19  Yes McLean-Scocuzza, Nino Glow, MD  HYDROcodone-acetaminophen (NORCO/VICODIN) 5-325 MG tablet Take 1 tablet by mouth 2 (two) times daily as needed for moderate pain. 01/03/19  Yes McLean-Scocuzza, Nino Glow, MD  lovastatin (MEVACOR) 20 MG tablet Take 1 tablet (20 mg total) by mouth daily at 6 PM. 04/08/19  Yes McLean-Scocuzza, Nino Glow, MD  Magnesium 250 MG TABS Take 250 mg by mouth daily.   Yes [provider]  metoprolol succinate (TOPROL-XL) 100 MG 24 hr tablet TAKE 1 TABLET BY MOUTH DAILY WITH OR IMMEDIATLY FOLLOWING A MEAL 02/27/19  Yes McLean-Scocuzza, Nino Glow, MD  potassium chloride SA  (KLOR-CON) 10 MEQ tablet Take 1 tablet (10 mEq total) by mouth daily. 02/27/19  Yes McLean-Scocuzza, Nino Glow, MD  spironolactone (ALDACTONE) 25 MG tablet Take 0.5 tablets (12.5 mg total) by mouth daily. 01/04/19  Yes Belva Crome, MD  doxycycline (VIBRAMYCIN) 100  MG capsule Take 1 capsule (100 mg total) by mouth 2 (two) times daily for 7 days. 05/06/19 05/13/19  Sharion Balloon, NP  pantoprazole (PROTONIX) 40 MG tablet Take 1 tablet (40 mg total) by mouth daily. 12/31/18   McLean-Scocuzza, Nino Glow, MD    Family History Family History  Problem Relation Age of Onset  . Stroke Father   . Hypertension Mother        (a lot of animosity in their relationship)  . Heart failure Mother   . Gout Other        whole family   . Prostate cancer Brother   . Cancer Brother        prostate   . Hepatitis Brother        Hep C; alcoholism (terminal)  . Drug abuse Brother        died 77  . Breast cancer Sister   . Cancer Sister        breast cancer  . Other Brother        drug addiction    Social History Social History   Tobacco Use  . Smoking status: Former Smoker    Packs/day: 0.50    Years: 20.00    Pack years: 10.00    Types: Cigarettes  . Smokeless tobacco: Never Used  . Tobacco comment: quit approx. 20 years ago  Substance Use Topics  . Alcohol use: Yes    Alcohol/week: 0.0 standard drinks    Comment: wine occasional  . Drug use: No     Allergies   Buspirone hcl, Lisinopril, and Prozac [fluoxetine hcl]   Review of Systems Review of Systems  Constitutional: Negative for chills and fever.  HENT: Negative for ear pain and sore throat.   Eyes: Negative for pain and visual disturbance.  Respiratory: Negative for cough and shortness of breath.   Cardiovascular: Negative for chest pain and palpitations.  Gastrointestinal: Negative for abdominal pain and vomiting.  Genitourinary: Negative for dysuria and hematuria.  Musculoskeletal: Negative for arthralgias and back pain.  Skin:  Positive for wound. Negative for color change and rash.  Neurological: Negative for seizures and syncope.  All other systems reviewed and are negative.    Physical Exam Triage Vital Signs ED Triage Vitals  Enc Vitals Group     BP      Pulse      Resp      Temp      Temp src      SpO2      Weight      Height      Head Circumference      Peak Flow      Pain Score      Pain Loc      Pain Edu?      Excl. in [place]?    No data found.  Updated Vital Signs BP (!) 142/85 (BP Location: Right Arm)   Pulse 70   Temp 97.8 F (36.6 C) (Oral)   Resp 18   Ht 5\' 7"  (1.702 m)   Wt 248 lb (112.5 kg)   SpO2 95%   BMI 38.84 kg/m   Visual Acuity Right Eye Distance:   Left Eye Distance:   Bilateral Distance:    Right Eye Near:   Left Eye Near:    Bilateral Near:     Physical Exam Vitals and nursing note reviewed.  Constitutional:      General: She is not in acute distress.    Appearance:  She is well-developed.  HENT:     Head: Normocephalic and atraumatic.     Mouth/Throat:     Mouth: Mucous membranes are moist.  Eyes:     Conjunctiva/sclera: Conjunctivae normal.  Cardiovascular:     Rate and Rhythm: Normal rate and regular rhythm.     Heart sounds: No murmur.  Pulmonary:     Effort: Pulmonary effort is normal. No respiratory distress.     Breath sounds: Normal breath sounds.  Abdominal:     General: Bowel sounds are normal.     Palpations: Abdomen is soft.     Tenderness: There is no abdominal tenderness. There is no right CVA tenderness, left CVA tenderness, guarding or rebound.  Genitourinary:    Labia:        Right: Lesion present.      Vagina: No vaginal discharge.    Musculoskeletal:     Cervical back: Neck supple.  Skin:    General: Skin is warm and dry.  Neurological:     Mental Status: She is alert and oriented to person, place, and time.     Comments: Ambulatory with cane.  Psychiatric:        Mood and Affect: Mood normal.        Behavior: Behavior  normal.      UC Treatments / Results  Labs (all labs ordered are listed, but only abnormal results are displayed) Labs Reviewed  POCT URINALYSIS DIP (MANUAL ENTRY) - Abnormal; Notable for the following components:      Result Value   Clarity, UA cloudy (*)    Ketones, POC UA trace (5) (*)    All other components within normal limits    EKG   Radiology No results found.  Procedures Procedures (including critical care time)  Medications Ordered in UC Medications - No data to display  Initial Impression / Assessment and Plan / UC Course  I have reviewed the triage vital signs and the nursing notes.  Pertinent labs & imaging results that were available during my care of the patient were reviewed by me and considered in my medical decision making (see chart for details).    Abscess.  Treating with doxycycline.  Wound care instructions and signs of worsening infection discussed with patient.  Instructed her to follow-up with her PCP or return here if she notes signs of worsening infection.  Patient agrees to plan of care.     Final Clinical Impressions(s) / UC Diagnoses   Final diagnoses:  Abscess     Discharge Instructions     Take the antibiotic as directed.  Encourage drainage from the abscess.    Keep the area clean and dry.  Wash it gently twice a day with soap and water.    Return here or follow up with your primary care provider if you see signs of worsening infection, such as increased pain, redness, warmth, fever, chills, or other concerning symptoms.        ED Prescriptions    Medication Sig Dispense Auth. Provider   doxycycline (VIBRAMYCIN) 100 MG capsule Take 1 capsule (100 mg total) by mouth 2 (two) times daily for 7 days. 14 capsule Sharion Balloon, NP     PDMP not reviewed this encounter.   Sharion Balloon, NP 05/06/19 1501

## 2019-05-31 ENCOUNTER — Telehealth: Payer: Self-pay | Admitting: Emergency Medicine

## 2019-05-31 ENCOUNTER — Other Ambulatory Visit: Payer: Self-pay

## 2019-05-31 ENCOUNTER — Ambulatory Visit
Admission: EM | Admit: 2019-05-31 | Discharge: 2019-05-31 | Disposition: A | Discharge status: Home / Self Care | Payer: Medicare HMO

## 2019-05-31 DIAGNOSIS — N3090 Cystitis, unspecified without hematuria: Secondary | ICD-10-CM

## 2019-05-31 LAB — POC URINALSYSI DIPSTICK (AUTOMATED)
Bilirubin, UA: NEGATIVE
Glucose, UA: NEGATIVE
Ketones, UA: NEGATIVE
Nitrite, UA: NEGATIVE
Protein, UA: NEGATIVE
Spec Grav, UA: 1.02 (ref 1.010–1.025)
Urobilinogen, UA: 0.2 E.U./dL
pH, UA: 5 (ref 5.0–8.0)

## 2019-05-31 NOTE — Discharge Instructions (Addendum)
Bring [place] a urine specimen today if you are able to get one.  If you are not able to get a urine specimen today, follow-up with your primary care provider next week.

## 2019-05-31 NOTE — ED Provider Notes (Signed)
Monica [name]    CSN: [id number] Arrival date & time: 05/31/19  1025      History   Chief Complaint Chief Complaint  Patient presents with  . possible vaginal/urethral bleeding    HPI Monica [name] is a 84 y.o. female.  Patient presents with vaginal swelling and blood when she wipes after urinating x 1 week.  She denies fever, chills, abdominal pain, dysuria, back pain, vaginal discharge, pelvic pain, or other symptoms.  She has been treating with OTC rash cream.  Patient was seen here on 05/06/2019 for a labial abscess and treated with doxycycline.    The history is provided by the patient.    Past Medical History:  Diagnosis Date  . Allergic rhinitis   . Anxiety   . Benign neoplasm of breast 2013  . Cardiac arrhythmia   . Cardiomegaly   . Cataract   . CHF (congestive heart failure) ([place])   . Compulsive eating patterns   . Compulsive overeating   . Depression   . Diastolic heart failure (HCC)    LVH with free wall thickness 1.4 cm EF 60% Dr. Tamala [name]   . Diffuse cystic mastopathy 2013  . Edema   . Epistaxis    noted 12/20/18   . GI bleeding    in ~2018   . Gout   . Hiatal hernia   . HTN (hypertension)   . Hyperlipidemia   . Lump or mass in breast 2012  . OA (osteoarthritis) of knee    with injections  . Obesity   . OSA (obstructive sleep apnea)    not on cpap  . Other abnormal glucose   . Peptic stricture of esophagus   . Personal history of tobacco use, presenting hazards to health   . Sciatica   . Sinus infection 2010  . Spinal stenosis    lumbar   . Stroke ([place])   . Unspecified sleep apnea     Patient Active Problem List   Diagnosis Date Noted  . Memory loss 03/06/2019  . Iron deficiency anemia 01/03/2019  . Generalized abdominal pain 01/03/2019  . Spinal stenosis   . Vitamin D deficiency 12/26/2018  . Leg DVT (deep venous thromboembolism), acute, bilateral ([place]) 12/26/2018  . Chronic back pain 12/26/2018  . Stroke (cerebrum) ([place])  09/01/2018  . Middle cerebral artery embolism, 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  . Degenerative lumbar spinal stenosis 09/20/2017  . Lumbar radiculopathy 07/31/2017  . Lower GI bleed 10/03/2016  . Diverticulosis of colon with hemorrhage   . Status post bilateral knee replacements 05/04/2016  . Chronic cough 04/15/2016  . Seborrheic keratoses 04/15/2016  . Cervical disc disorder with radiculopathy of cervical region 12/01/2015  . Partial nontraumatic rupture of right rotator cuff 09/22/2015  . Compulsive overeating 01/01/2015  . Chronic diastolic heart failure (HCC) 12/12/2012    Class: Chronic  . Urge incontinence of urine 09/09/2011  . Knee osteoarthritis 02/09/2010  . Gout 03/20/2008  . Hyperlipidemia 12/21/2006  . Depression with anxiety 12/21/2006  . Essential hypertension 12/21/2006  . Obstructive sleep apnea 12/21/2006    Past Surgical History:  Procedure Laterality Date  . adenosine cardiolite  1/08   low risk   . APPENDECTOMY    . BREAST BIOPSY     02/28/11 negative  . BREAST LUMPECTOMY     right x1 ('89) left x2 ('70s, '90)  . CARDIAC CATHETERIZATION  2001   normal per pt  . COLONOSCOPY  11/02   diverticulosis  . COLONOSCOPY  9/06  diverticulosis, hemorhoids  . COLONOSCOPY N/A 10/01/2016   Procedure: COLONOSCOPY;  Surgeon: Gatha Mayer, MD;  Location: [facility];  Service: Endoscopy;  Laterality: N/A;  . dexa  3/02   normal  . EYE SURGERY  2012   cataract  . IR CT HEAD LTD  09/01/2018  . IR PERCUTANEOUS ART THROMBECTOMY/INFUSION INTRACRANIAL INC DIAG ANGIO  09/01/2018  . JOINT REPLACEMENT     knee b/l 2011  . knee replacement Right 9/11   R. Dr. Telford Nab  . RADIOLOGY WITH ANESTHESIA N/A 09/01/2018   Procedure: IR WITH ANESTHESIA;  Surgeon: Luanne Bras, MD;  Location: [facility];  Service: Radiology;  Laterality: N/A;  . sleep study  5/05  . TONSILLECTOMY    . TOTAL ABDOMINAL HYSTERECTOMY     fibroids, age of 43  . TOTAL KNEE ARTHROPLASTY Left 2012  . US TRANSVAGINAL  PELVIC MODIFIED  2000, 2001    OB History    Gravida  2   Para      Term      Preterm      AB  1   Living  1     SAB  1   TAB      Ectopic      Multiple      Live Births           Obstetric Comments  Age with first menstruation-14 Age with first pregnancy-22 LMP-age 32, hysterectomy         Home Medications    Prior to Admission medications   Medication Sig Start Date End Date Taking? Authorizing Provider  allopurinol (ZYLOPRIM) 100 MG tablet TAKE 2 TABLETS BY MOUTH ONCE A DAY 02/27/19  Yes McLean-Scocuzza, Nino Glow, MD  ALPRAZolam Duanne Moron) 0.25 MG tablet Take 1 tablet (0.25 mg total) by mouth 2 (two) times daily as needed for anxiety. 12/04/18  Yes Belva Crome, MD  colchicine 0.6 MG tablet Take 1 tablet (0.6 mg total) by mouth daily. 12/31/18  Yes McLean-Scocuzza, Nino Glow, MD  DULoxetine (CYMBALTA) 30 MG capsule Take 1 capsule (30 mg total) by mouth daily. 02/27/19  Yes McLean-Scocuzza, Nino Glow, MD  ELIQUIS 5 MG TABS tablet TAKE 1 TABLET BY MOUTH TWICE A DAY 02/04/19  Yes Belva Crome, MD  furosemide (LASIX) 40 MG tablet Take 1 tablet (40 mg total) by mouth daily. 12/04/18  Yes Belva Crome, MD  gabapentin (NEURONTIN) 300 MG capsule Take 1 capsule (300 mg total) by mouth 2 (two) times daily. 03/06/19  Yes McLean-Scocuzza, Nino Glow, MD  HYDROcodone-acetaminophen (NORCO/VICODIN) 5-325 MG tablet Take 1 tablet by mouth 2 (two) times daily as needed for moderate pain. 01/03/19  Yes McLean-Scocuzza, Nino Glow, MD  lovastatin (MEVACOR) 20 MG tablet Take 1 tablet (20 mg total) by mouth daily at 6 PM. 04/08/19  Yes McLean-Scocuzza, Nino Glow, MD  Magnesium 250 MG TABS Take 250 mg by mouth daily.   Yes [provider]  metoprolol succinate (TOPROL-XL) 100 MG 24 hr tablet TAKE 1 TABLET BY MOUTH DAILY WITH OR IMMEDIATLY FOLLOWING A MEAL 02/27/19  Yes McLean-Scocuzza, Nino Glow, MD  pantoprazole (PROTONIX) 40 MG tablet Take 1 tablet (40 mg total) by mouth daily. 12/31/18  Yes  McLean-Scocuzza, Nino Glow, MD  potassium chloride SA (KLOR-CON) 10 MEQ tablet Take 1 tablet (10 mEq total) by mouth daily. 02/27/19  Yes McLean-Scocuzza, Nino Glow, MD  spironolactone (ALDACTONE) 25 MG tablet Take 0.5 tablets (12.5 mg total) by mouth daily. 01/04/19  Yes Daneen Schick  W, MD  amLODipine (NORVASC) 5 MG tablet Take 1 tablet (5 mg total) by mouth daily. 12/04/18 05/06/19  Belva Crome, MD    Family History Family History  Problem Relation Age of Onset  . Stroke Father   . Hypertension Mother        (a lot of animosity in their relationship)  . Heart failure Mother   . Gout Other        whole family   . Prostate cancer Brother   . Cancer Brother        prostate   . Hepatitis Brother        Hep C; alcoholism (terminal)  . Drug abuse Brother        died 61  . Breast cancer Sister   . Cancer Sister        breast cancer  . Other Brother        drug addiction    Social History Social History   Tobacco Use  . Smoking status: Former Smoker    Packs/day: 0.50    Years: 20.00    Pack years: 10.00    Types: Cigarettes  . Smokeless tobacco: Never Used  . Tobacco comment: quit approx. 20 years ago  Substance Use Topics  . Alcohol use: Yes    Alcohol/week: 0.0 standard drinks    Comment: wine occasional  . Drug use: No     Allergies   Buspirone hcl, Lisinopril, and Prozac [fluoxetine hcl]   Review of Systems Review of Systems  Constitutional: Negative for chills and fever.  HENT: Negative for ear pain and sore throat.   Eyes: Negative for pain and visual disturbance.  Respiratory: Negative for cough and shortness of breath.   Cardiovascular: Negative for chest pain and palpitations.  Gastrointestinal: Negative for abdominal pain and vomiting.  Genitourinary: Positive for hematuria. Negative for dysuria, flank pain, pelvic pain, vaginal discharge and vaginal pain.  Musculoskeletal: Negative for arthralgias and back pain.  Skin: Negative for color change and  rash.  Neurological: Negative for seizures and syncope.  All other systems reviewed and are negative.    Physical Exam Triage Vital Signs ED Triage Vitals  Enc Vitals Group     BP      Pulse      Resp      Temp      Temp src      SpO2      Weight      Height      Head Circumference      Peak Flow      Pain Score      Pain Loc      Pain Edu?      Excl. in [place]?    No data found.  Updated Vital Signs BP 103/70 (BP Location: Left Arm)   Pulse 87   Temp 98.7 F (37.1 C) (Oral)   Resp 19   Ht 5\' 7"  (1.702 m)   Wt 248 lb (112.5 kg)   SpO2 95%   BMI 38.84 kg/m   Visual Acuity Right Eye Distance:   Left Eye Distance:   Bilateral Distance:    Right Eye Near:   Left Eye Near:    Bilateral Near:     Physical Exam Vitals and nursing note reviewed.  Constitutional:      General: She is not in acute distress.    Appearance: She is well-developed.  HENT:     Head: Normocephalic and atraumatic.  Mouth/Throat:     Mouth: Mucous membranes are moist.     Pharynx: Oropharynx is clear.  Eyes:     Conjunctiva/sclera: Conjunctivae normal.  Cardiovascular:     Rate and Rhythm: Normal rate and regular rhythm.     Heart sounds: No murmur.  Pulmonary:     Effort: Pulmonary effort is normal. No respiratory distress.     Breath sounds: Normal breath sounds.  Abdominal:     Palpations: Abdomen is soft.     Tenderness: There is no abdominal tenderness. There is no right CVA tenderness, left CVA tenderness, guarding or rebound.  Genitourinary:    General: Normal vulva.     Rectum: Normal.  Musculoskeletal:     Cervical back: Neck supple.  Skin:    General: Skin is warm and dry.     Findings: No lesion or rash.  Neurological:     General: No focal deficit present.     Mental Status: She is alert and oriented to person, place, and time.  Psychiatric:        Mood and Affect: Mood normal.        Behavior: Behavior normal.      UC Treatments / Results  Labs (all  labs ordered are listed, but only abnormal results are displayed) Labs Reviewed - No data to display  EKG   Radiology No results found.  Procedures Procedures (including critical care time)  Medications Ordered in UC Medications - No data to display  Initial Impression / Assessment and Plan / UC Course  I have reviewed the triage vital signs and the nursing notes.  Pertinent labs & imaging results that were available during my care of the patient were reviewed by me and considered in my medical decision making (see chart for details).    Cystitis.  Patient was unable to provide a urine sample here; After repeated attempts, patient send home with urine hat, specimen cup, sani-wipe; Instructed to bring urine sample in if she is able to obtain one today.  Instructed her to follow up with her PCP next week if she is not able to get back to [place] today with a urine sample.  Patient agrees to plan of care.      Final Clinical Impressions(s) / UC Diagnoses   Final diagnoses:  Cystitis     Discharge Instructions     Bring [place] a urine specimen today if you are able to get one.  If you are not able to get a urine specimen today, follow-up with your primary care provider next week.           ED Prescriptions    None     PDMP not reviewed this encounter.   Sharion Balloon, NP 05/31/19 1550

## 2019-05-31 NOTE — ED Triage Notes (Addendum)
Pt presents with c/o possible vaginal/urethral bleeding when wiping after urinating. Pt reports swollen area to the left side of her perineum. She states the area is irritated. She has been applying [facility] medicated rash cream for incontinence and Balmex skin relief cream to the area. She states some time in the last week she noticed some blood when wiping after using the bathroom. Pt states she has been watching the show [place] to help her sleep. She reports one night recently feeling as though she was masturbating in her sleep. She denies any known penetration. She states when she woke up she felt as though she was very wet around her vagina as though she had been having sex and the area around her vagina was painful. Pt reports she has been a widow for 33years and has not had any intercourse in that time. She is not sure if she dreamed the episode. She does report during the dream she did not feel any pain. Pt states she does live alone. Pt also reports dreams about her husband and feeling him close in bed to her. Pt also had an episode of blood when wiping after urinating this morning. She states she does wipe very hard and this is a habit of hers. Pt is not sure if bleeding is coming from vagina or urethra. Pt does not report any rectal bleeding.   Pt also reports lifting a heavy pan into her refrigerator and into the oven recently. She has also been moving heavy plants around the house. She wonders if all the lifting could have contributed to the pressure and symptoms she is having.

## 2019-06-04 LAB — URINE CULTURE

## 2019-06-06 ENCOUNTER — Other Ambulatory Visit: Payer: Self-pay

## 2019-06-06 ENCOUNTER — Ambulatory Visit (INDEPENDENT_AMBULATORY_CARE_PROVIDER_SITE_OTHER): Payer: Medicare HMO

## 2019-06-06 ENCOUNTER — Encounter: Payer: Self-pay | Admitting: Internal Medicine

## 2019-06-06 ENCOUNTER — Ambulatory Visit (INDEPENDENT_AMBULATORY_CARE_PROVIDER_SITE_OTHER): Payer: Medicare HMO | Admitting: Internal Medicine

## 2019-06-06 VITALS — BP 130/84 | HR 73 | Temp 96.0°F | Ht 67.0 in | Wt 248.8 lb

## 2019-06-06 DIAGNOSIS — E559 Vitamin D deficiency, unspecified: Secondary | ICD-10-CM | POA: Diagnosis not present

## 2019-06-06 DIAGNOSIS — Z1329 Encounter for screening for other suspected endocrine disorder: Secondary | ICD-10-CM

## 2019-06-06 DIAGNOSIS — M6289 Other specified disorders of muscle: Secondary | ICD-10-CM | POA: Diagnosis not present

## 2019-06-06 DIAGNOSIS — M19012 Primary osteoarthritis, left shoulder: Secondary | ICD-10-CM

## 2019-06-06 DIAGNOSIS — D167 Benign neoplasm of ribs, sternum and clavicle: Secondary | ICD-10-CM

## 2019-06-06 DIAGNOSIS — M25512 Pain in left shoulder: Secondary | ICD-10-CM | POA: Diagnosis not present

## 2019-06-06 DIAGNOSIS — G8929 Other chronic pain: Secondary | ICD-10-CM | POA: Diagnosis not present

## 2019-06-06 DIAGNOSIS — I110 Hypertensive heart disease with heart failure: Secondary | ICD-10-CM | POA: Diagnosis not present

## 2019-06-06 DIAGNOSIS — I509 Heart failure, unspecified: Secondary | ICD-10-CM | POA: Diagnosis not present

## 2019-06-06 DIAGNOSIS — I10 Essential (primary) hypertension: Secondary | ICD-10-CM

## 2019-06-06 HISTORY — DX: Other specified disorders of muscle: M62.89

## 2019-06-06 MED ORDER — DICLOFENAC SODIUM 1 % EX GEL: 2.0000 g | Freq: Four times a day (QID) | CUTANEOUS | 11 refills | Status: DC | PRN

## 2019-06-06 NOTE — Patient Instructions (Addendum)
Dr. Marcelline Mates gyn referral   Shoulder Exercises Ask your health care provider which exercises are safe for you. Do exercises exactly as told by your health care provider and adjust them as directed. It is normal to feel mild stretching, pulling, tightness, or discomfort as you do these exercises. Stop right away if you feel sudden pain or your pain gets worse. Do not begin these exercises until told by your health care provider. Stretching exercises External rotation and abduction This exercise is sometimes called corner stretch. This exercise rotates your arm outward (external rotation) and moves your arm out from your body (abduction). 1. Stand in a doorway with one of your feet slightly in front of the other. This is called a staggered stance. If you cannot reach your forearms to the door frame, stand facing a corner of a room. 2. Choose one of the following positions as told by your health care provider: ? Place your hands and forearms on the door frame above your head. ? Place your hands and forearms on the door frame at the height of your head. ? Place your hands on the door frame at the height of your elbows. 3. Slowly move your weight onto your front foot until you feel a stretch across your chest and in the front of your shoulders. Keep your head and chest upright and keep your abdominal muscles tight. 4. Hold for __________ seconds. 5. To release the stretch, shift your weight to your back foot. Repeat __________ times. Complete this exercise __________ times a day. Extension, standing 1. Stand and hold a broomstick, a cane, or a similar object behind your back. ? Your hands should be a little wider than shoulder width apart. ? Your palms should face away from your back. 2. Keeping your elbows straight and your shoulder muscles relaxed, move the stick away from your body until you feel a stretch in your shoulders (extension). ? Avoid shrugging your shoulders while you move the stick. Keep  your shoulder blades tucked down toward the middle of your back. 3. Hold for __________ seconds. 4. Slowly return to the starting position. Repeat __________ times. Complete this exercise __________ times a day. Range-of-motion exercises Pendulum  1. Stand near a wall or a surface that you can hold onto for balance. 2. Bend at the waist and let your left / right arm hang straight down. Use your other arm to support you. Keep your back straight and do not lock your knees. 3. Relax your left / right arm and shoulder muscles, and move your hips and your trunk so your left / right arm swings freely. Your arm should swing because of the motion of your body, not because you are using your arm or shoulder muscles. 4. Keep moving your hips and trunk so your arm swings in the following directions, as told by your health care provider: ? Side to side. ? Forward and backward. ? In clockwise and counterclockwise circles. 5. Continue each motion for __________ seconds, or for as long as told by your health care provider. 6. Slowly return to the starting position. Repeat __________ times. Complete this exercise __________ times a day. Shoulder flexion, standing  1. Stand and hold a broomstick, a cane, or a similar object. Place your hands a little more than shoulder width apart on the object. Your left / right hand should be palm up, and your other hand should be palm down. 2. Keep your elbow straight and your shoulder muscles relaxed. Push the stick  up with your healthy arm to raise your left / right arm in front of your body, and then over your head until you feel a stretch in your shoulder (flexion). ? Avoid shrugging your shoulder while you raise your arm. Keep your shoulder blade tucked down toward the middle of your back. 3. Hold for __________ seconds. 4. Slowly return to the starting position. Repeat __________ times. Complete this exercise __________ times a day. Shoulder abduction,  standing 1. Stand and hold a broomstick, a cane, or a similar object. Place your hands a little more than shoulder width apart on the object. Your left / right hand should be palm up, and your other hand should be palm down. 2. Keep your elbow straight and your shoulder muscles relaxed. Push the object across your body toward your left / right side. Raise your left / right arm to the side of your body (abduction) until you feel a stretch in your shoulder. ? Do not raise your arm above shoulder height unless your health care provider tells you to do that. ? If directed, raise your arm over your head. ? Avoid shrugging your shoulder while you raise your arm. Keep your shoulder blade tucked down toward the middle of your back. 3. Hold for __________ seconds. 4. Slowly return to the starting position. Repeat __________ times. Complete this exercise __________ times a day. Internal rotation  1. Place your left / right hand behind your back, palm up. 2. Use your other hand to dangle an exercise band, a towel, or a similar object over your shoulder. Grasp the band with your left / right hand so you are holding on to both ends. 3. Gently pull up on the band until you feel a stretch in the front of your left / right shoulder. The movement of your arm toward the center of your body is called internal rotation. ? Avoid shrugging your shoulder while you raise your arm. Keep your shoulder blade tucked down toward the middle of your back. 4. Hold for __________ seconds. 5. Release the stretch by letting go of the band and lowering your hands. Repeat __________ times. Complete this exercise __________ times a day. Strengthening exercises External rotation  1. Sit in a stable chair without armrests. 2. Secure an exercise band to a stable object at elbow height on your left / right side. 3. Place a soft object, such as a folded towel or a small pillow, between your left / right upper arm and your body to move  your elbow about 4 inches (10 cm) away from your side. 4. Hold the end of the exercise band so it is tight and there is no slack. 5. Keeping your elbow pressed against the soft object, slowly move your forearm out, away from your abdomen (external rotation). Keep your body steady so only your forearm moves. 6. Hold for __________ seconds. 7. Slowly return to the starting position. Repeat __________ times. Complete this exercise __________ times a day. Shoulder abduction  1. Sit in a stable chair without armrests, or stand up. 2. Hold a __________ weight in your left / right hand, or hold an exercise band with both hands. 3. Start with your arms straight down and your left / right palm facing in, toward your body. 4. Slowly lift your left / right hand out to your side (abduction). Do not lift your hand above shoulder height unless your health care provider tells you that this is safe. ? Keep your arms straight. ?  Avoid shrugging your shoulder while you do this movement. Keep your shoulder blade tucked down toward the middle of your back. 5. Hold for __________ seconds. 6. Slowly lower your arm, and return to the starting position. Repeat __________ times. Complete this exercise __________ times a day. Shoulder extension 1. Sit in a stable chair without armrests, or stand up. 2. Secure an exercise band to a stable object in front of you so it is at shoulder height. 3. Hold one end of the exercise band in each hand. Your palms should face each other. 4. Straighten your elbows and lift your hands up to shoulder height. 5. Step back, away from the secured end of the exercise band, until the band is tight and there is no slack. 6. Squeeze your shoulder blades together as you pull your hands down to the sides of your thighs (extension). Stop when your hands are straight down by your sides. Do not let your hands go behind your body. 7. Hold for __________ seconds. 8. Slowly return to the starting  position. Repeat __________ times. Complete this exercise __________ times a day. Shoulder row 1. Sit in a stable chair without armrests, or stand up. 2. Secure an exercise band to a stable object in front of you so it is at waist height. 3. Hold one end of the exercise band in each hand. Position your palms so that your thumbs are facing the ceiling (neutral position). 4. Bend each of your elbows to a 90-degree angle (right angle) and keep your upper arms at your sides. 5. Step back until the band is tight and there is no slack. 6. Slowly pull your elbows back behind you. 7. Hold for __________ seconds. 8. Slowly return to the starting position. Repeat __________ times. Complete this exercise __________ times a day. Shoulder press-ups  1. Sit in a stable chair that has armrests. Sit upright, with your feet flat on the floor. 2. Put your hands on the armrests so your elbows are bent and your fingers are pointing forward. Your hands should be about even with the sides of your body. 3. Push down on the armrests and use your arms to lift yourself off the chair. Straighten your elbows and lift yourself up as much as you comfortably can. ? Move your shoulder blades down, and avoid letting your shoulders move up toward your ears. ? Keep your feet on the ground. As you get stronger, your feet should support less of your body weight as you lift yourself up. 4. Hold for __________ seconds. 5. Slowly lower yourself back into the chair. Repeat __________ times. Complete this exercise __________ times a day. Wall push-ups  1. Stand so you are facing a stable wall. Your feet should be about one arm-length away from the wall. 2. Lean forward and place your palms on the wall at shoulder height. 3. Keep your feet flat on the floor as you bend your elbows and lean forward toward the wall. 4. Hold for __________ seconds. 5. Straighten your elbows to push yourself back to the starting position. Repeat  __________ times. Complete this exercise __________ times a day. This information is not intended to replace advice given to you by your health care provider. Make sure you discuss any questions you have with your health care provider. Document Revised: 06/01/2018 Document Reviewed: 03/09/2018 Elsevier Patient Education  [place].  Shoulder Range of Motion Exercises Shoulder range of motion (ROM) exercises are done to keep the shoulder moving freely or  to increase movement. They are often recommended for people who have shoulder pain or stiffness or who are recovering from a shoulder surgery. Phase 1 exercises When you are able, do this exercise 1-2 times per day for 30-60 seconds in each direction, or as directed by your health care provider. Pendulum exercise To do this exercise while sitting: 1. Sit in a chair or at the edge of your bed with your feet flat on the floor. 2. Let your affected arm hang down in front of you over the edge of the bed or chair. 3. Relax your shoulder, arm, and hand. [place] your body so your arm gently swings in small circles. You can also use your unaffected arm to start the motion. 5. Repeat changing the direction of the circles, swinging your arm left and right, and swinging your arm forward and back. To do this exercise while standing: 1. Stand next to a sturdy chair or table, and hold on to it with your hand on your unaffected side. 2. Bend forward at the waist. 3. Bend your knees slightly. 4. Relax your shoulder, arm, and hand. 5. While keeping your shoulder relaxed, use body motion to swing your arm in small circles. 6. Repeat changing the direction of the circles, swinging your arm left and right, and swinging your arm forward and back. 7. Between exercises, stand up tall and take a short break to relax your lower back.  Phase 2 exercises Do these exercises 1-2 times per day or as told by your health care provider. Hold each stretch for 30  seconds, and repeat 3 times. Do the exercises with one or both arms as instructed by your health care provider. For these exercises, sit at a table with your hand and arm supported by the table. A chair that slides easily or has wheels can be helpful. External rotation 1. Turn your chair so that your affected side is nearest to the table. 2. Place your forearm on the table to your side. Bend your elbow about 90 at the elbow (right angle) and place your hand palm facing down on the table. Your elbow should be about 6 inches away from your side. 3. Keeping your arm on the table, lean your body forward. Abduction 1. Turn your chair so that your affected side is nearest to the table. 2. Place your forearm and hand on the table so that your thumb points toward the ceiling and your arm is straight out to your side. 3. Slide your hand out to the side and away from you, using your unaffected arm to do the work. 4. To increase the stretch, you can slide your chair away from the table. Flexion: forward stretch 1. Sit facing the table. Place your hand and elbow on the table in front of you. 2. Slide your hand forward and away from you, using your unaffected arm to do the work. 3. To increase the stretch, you can slide your chair backward. Phase 3 exercises Do these exercises 1-2 times per day or as told by your health care provider. Hold each stretch for 30 seconds, and repeat 3 times. Do the exercises with one or both arms as instructed by your health care provider. Cross-body stretch: posterior capsule stretch 1. Lift your arm straight out in front of you. 2. Bend your arm 90 at the elbow (right angle) so your forearm moves across your body. 3. Use your other arm to gently pull the elbow across your body, toward your other  shoulder. Wall climbs 1. Stand with your affected arm extended out to the side with your hand resting on a door frame. 2. Slide your hand slowly up the door frame. 3. To increase  the stretch, step through the door frame. Keep your body upright and do not lean. Wand exercises You will need a cane, a piece of PVC pipe, or a sturdy wooden dowel for wand exercises. Flexion To do this exercise while standing: 1. Hold the wand with both of your hands, palms down. 2. Using the other arm to help, lift your arms up and over your head, if able. 3. Push upward with your other arm to gently increase the stretch. To do this exercise while lying down: 1. Lie on your back with your elbows resting on the floor and the wand in both your hands. Your hands will be palm down, or pointing toward your feet. 2. Lift your hands toward the ceiling, using your unaffected arm to help if needed. 3. Bring your arms overhead as able, using your unaffected arm to help if needed. Internal rotation 1. Stand while holding the wand behind you with both hands. Your unaffected arm should be extended above your head with the arm of the affected side extended behind you at the level of your waist. The wand should be pointing straight up and down as you hold it. 2. Slowly pull the wand up behind your back by straightening the elbow of your unaffected arm and bending the elbow of your affected arm. External rotation 1. Lie on your back with your affected upper arm supported on a small pillow or rolled towel. When you first do this exercise, keep your upper arm close to your body. Over time, bring your arm up to a 90 angle out to the side. 2. Hold the wand across your stomach and with both hands palm up. Your elbow on your affected side should be bent at a 90 angle. 3. Use your unaffected side to help push your forearm away from you and toward the floor. Keep your elbow on your affected side bent at a 90 angle. Contact a health care provider if you have:  New or increasing pain.  New numbness, tingling, weakness, or discoloration in your arm or hand. This information is not intended to replace advice given  to you by your health care provider. Make sure you discuss any questions you have with your health care provider. Document Revised: 03/22/2017 Document Reviewed: 03/22/2017 Elsevier Patient Education  [place].  Kegel Exercises  Kegel exercises can help strengthen your pelvic floor muscles. The pelvic floor is a group of muscles that support your rectum, small intestine, and bladder. In females, pelvic floor muscles also help support the womb (uterus). These muscles help you control the flow of urine and stool. Kegel exercises are painless and simple, and they do not require any equipment. Your provider may suggest Kegel exercises to:  Improve bladder and bowel control.  Improve sexual response.  Improve weak pelvic floor muscles after surgery to remove the uterus (hysterectomy) or pregnancy (females).  Improve weak pelvic floor muscles after prostate gland removal or surgery (males). Kegel exercises involve squeezing your pelvic floor muscles, which are the same muscles you squeeze when you try to stop the flow of urine or keep from passing gas. The exercises can be done while sitting, standing, or lying down, but it is best to vary your position. Exercises How to do Kegel exercises: 1. Squeeze your pelvic floor  muscles tight. You should feel a tight lift in your rectal area. If you are a female, you should also feel a tightness in your vaginal area. Keep your stomach, buttocks, and legs relaxed. 2. Hold the muscles tight for up to 10 seconds. 3. Breathe normally. 4. Relax your muscles. 5. Repeat as told by your health care provider. Repeat this exercise daily as told by your health care provider. Continue to do this exercise for at least 4-6 weeks, or for as long as told by your health care provider. You may be referred to a physical therapist who can help you learn more about how to do Kegel exercises. Depending on your condition, your health care provider may  recommend:  Varying how long you squeeze your muscles.  Doing several sets of exercises every day.  Doing exercises for several weeks.  Making Kegel exercises a part of your regular exercise routine. This information is not intended to replace advice given to you by your health care provider. Make sure you discuss any questions you have with your health care provider. Document Revised: 09/27/2017 Document Reviewed: 09/27/2017 Elsevier Patient Education  [place].

## 2019-06-06 NOTE — Progress Notes (Signed)
Chief Complaint  Patient presents with  . Follow-up  . Shoulder Pain    left shoulder pain, onset of around easter. Pain 8/10, mainly with movement and at night. Pt has been doing some heavy lifting but no known injuries and no falls.    F/u with daughter today Monica [name]  1. Left clavicle increased in size w/o pain no trauma and left shoulder pain 8/10 pain with lifting and gardening nothing tried  2. Pelvic floor laxity wants to see ob/gyn Dr. Marcelline Mates 3. HTN norvasc 5 mg qd, lasix 40 mg qd, toprol 100 mg qd, spironolactone 25 mg qd controlled    Review of Systems  Constitutional: Negative for weight loss.  HENT: Negative for hearing loss.   Eyes: Negative for blurred vision.  Respiratory: Negative for shortness of breath.   Cardiovascular: Negative for chest pain.  Gastrointestinal: Negative for abdominal pain.  Genitourinary:       +urinary incontinence    Musculoskeletal: Positive for joint pain.  Skin: Negative for rash.  Neurological: Negative for headaches.  Psychiatric/Behavioral: Negative for depression.   Past Medical History:  Diagnosis Date  . Allergic rhinitis   . Anxiety   . Benign neoplasm of breast 2013  . Cardiac arrhythmia   . Cardiomegaly   . Cataract   . CHF (congestive heart failure) ([place])   . Compulsive eating patterns   . Compulsive overeating   . Depression   . Diastolic heart failure (HCC)    LVH with free wall thickness 1.4 cm EF 60% Dr. Tamala Julian   . Diffuse cystic mastopathy 2013  . Edema   . Epistaxis    noted 12/20/18   . GI bleeding    in ~2018   . Gout   . Hiatal hernia   . HTN (hypertension)   . Hyperlipidemia   . Lump or mass in breast 2012  . OA (osteoarthritis) of knee    with injections  . Obesity   . OSA (obstructive sleep apnea)    not on cpap  . Other abnormal glucose   . Peptic stricture of esophagus   . Personal history of tobacco use, presenting hazards to health   . Sciatica   . Sinus infection 2010  . Spinal stenosis     lumbar   . Stroke ([place])   . Unspecified sleep apnea    Past Surgical History:  Procedure Laterality Date  . adenosine cardiolite  1/08   low risk   . APPENDECTOMY    . BREAST BIOPSY     02/28/11 negative  . BREAST LUMPECTOMY     right x1 ('89) left x2 ('70s, '90)  . CARDIAC CATHETERIZATION  2001   normal per pt  . COLONOSCOPY  11/02   diverticulosis  . COLONOSCOPY  9/06   diverticulosis, hemorhoids  . COLONOSCOPY N/A 10/01/2016   Procedure: COLONOSCOPY;  Surgeon: Gatha Mayer, MD;  Location: [facility];  Service: Endoscopy;  Laterality: N/A;  . dexa  3/02   normal  . EYE SURGERY  2012   cataract  . IR CT HEAD LTD  09/01/2018  . IR PERCUTANEOUS ART THROMBECTOMY/INFUSION INTRACRANIAL INC DIAG ANGIO  09/01/2018  . JOINT REPLACEMENT     knee b/l 2011  . knee replacement Right 9/11   R. Dr. Telford Nab  . RADIOLOGY WITH ANESTHESIA N/A 09/01/2018   Procedure: IR WITH ANESTHESIA;  Surgeon: Luanne Bras, MD;  Location: [facility];  Service: Radiology;  Laterality: N/A;  . sleep study  5/05  .  TONSILLECTOMY    . TOTAL ABDOMINAL HYSTERECTOMY     fibroids, age of 20  . TOTAL KNEE ARTHROPLASTY Left 2012  . US TRANSVAGINAL PELVIC MODIFIED  2000, 2001   Family History  Problem Relation Age of Onset  . Stroke Father   . Hypertension Mother        (a lot of animosity in their relationship)  . Heart failure Mother   . Gout Other        whole family   . Prostate cancer Brother   . Cancer Brother        prostate   . Hepatitis Brother        Hep C; alcoholism (terminal)  . Drug abuse Brother        died 35  . Breast cancer Sister   . Cancer Sister        breast cancer  . Other Brother        drug addiction   Social History   Socioeconomic History  . Marital status: Widowed    Spouse name: Not on file  . Number of children: 1  . Years of education: Not on file  . Highest education level: Not on file  Occupational History  . Occupation: Retired Personnel officer: RETIRED  Tobacco Use  . Smoking status: Former Smoker    Packs/day: 0.50    Years: 20.00    Pack years: 10.00    Types: Cigarettes  . Smokeless tobacco: Never Used  . Tobacco comment: quit approx. 20 years ago  Substance and Sexual Activity  . Alcohol use: Yes    Alcohol/week: 0.0 standard drinks    Comment: wine occasional  . Drug use: No  . Sexual activity: Never  Other Topics Concern  . Not on file  Social History Narrative   Widowed x 30+ years as of 12/2018    1 daughter. 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 Difficulty of Paying Living Expenses:   Food Insecurity:   . Worried About Charity fundraiser in the Last Year:   . Arboriculturist in the Last Year:   Transportation Needs:   . Film/video editor (Medical):   Marland Kitchen Lack of Transportation (Non-Medical):   Physical Activity:   . Days of Exercise per Week:   . Minutes of Exercise per Session:   Stress:   . Feeling of Stress :   Social Connections:   . Frequency of Communication with Friends and Family:   . Frequency of Social Gatherings with Friends and Family:   . Attends Religious Services:   . Active Member of Clubs or Organizations:   . Attends Archivist Meetings:   Marland Kitchen Marital Status:   Intimate Partner Violence:   . Fear of Current or Ex-Partner:   . Emotionally Abused:   Marland Kitchen Physically Abused:   . Sexually Abused:    Current Meds  Medication Sig  . allopurinol (ZYLOPRIM) 100 MG tablet TAKE 2 TABLETS BY MOUTH ONCE A DAY  . ALPRAZolam (XANAX) 0.25 MG tablet Take 1 tablet (0.25 mg total) by mouth 2 (two) times daily as needed for anxiety.  . Ascorbic Acid (VITAMIN C PO) Take by mouth.  . Cholecalciferol (VITAMIN D3 PO) Take by mouth.  . colchicine 0.6 MG tablet Take 1 tablet (0.6 mg total)  by mouth daily.  . DULoxetine (CYMBALTA) 30 MG capsule Take 1  capsule (30 mg total) by mouth daily.  Marland Kitchen ELIQUIS 5 MG TABS tablet TAKE 1 TABLET BY MOUTH TWICE A DAY  . furosemide (LASIX) 40 MG tablet Take 1 tablet (40 mg total) by mouth daily.  Marland Kitchen gabapentin (NEURONTIN) 300 MG capsule Take 1 capsule (300 mg total) by mouth 2 (two) times daily.  Marland Kitchen HYDROcodone-acetaminophen (NORCO/VICODIN) 5-325 MG tablet Take 1 tablet by mouth 2 (two) times daily as needed for moderate pain.  Marland Kitchen lovastatin (MEVACOR) 20 MG tablet Take 1 tablet (20 mg total) by mouth daily at 6 PM.  . Magnesium 250 MG TABS Take 250 mg by mouth daily.  . metoprolol succinate (TOPROL-XL) 100 MG 24 hr tablet TAKE 1 TABLET BY MOUTH DAILY WITH OR IMMEDIATLY FOLLOWING A MEAL  . Multiple Vitamins-Minerals (MULTIVITAMIN WOMEN 50+ PO) Take by mouth.  . potassium chloride SA (KLOR-CON) 10 MEQ tablet Take 1 tablet (10 mEq total) by mouth daily.  Marland Kitchen spironolactone (ALDACTONE) 25 MG tablet Take 0.5 tablets (12.5 mg total) by mouth daily.   Allergies  Allergen Reactions  . Buspirone Hcl Other (See Comments)    REACTION: made her sleepy, ? swollen legs  . Lisinopril Other (See Comments)    REACTION: dizziness  . Prozac [Fluoxetine Hcl] Other (See Comments)    sleepy   Recent Results (from the past 2160 hour(s))  I-STAT creatinine     Status: None   Collection Time: 03/19/19  9:56 AM  Result Value Ref Range   Creatinine, Ser 1.00 0.44 - 1.00 mg/dL  POCT urinalysis dipstick     Status: Abnormal   Collection Time: 05/06/19  2:39 PM  Result Value Ref Range   Color, UA yellow yellow   Clarity, UA cloudy (A) clear   Glucose, UA negative negative mg/dL   Bilirubin, UA negative negative   Ketones, POC UA trace (5) (A) negative mg/dL   Spec Grav, UA 1.025 1.010 - 1.025   Blood, UA negative negative   pH, UA 7.0 5.0 - 8.0   Protein Ur, POC negative negative mg/dL   Urobilinogen, UA 0.2 0.2 or 1.0 E.U./dL   Nitrite, UA Negative Negative   Leukocytes, UA Negative Negative  Urine Culture     Status: None    Collection Time: 05/31/19 12:00 AM  Result Value Ref Range   Urine Culture, Routine Final report    Organism ID, Bacteria Comment     Comment: Greater than 2 organisms recovered, none predominant. Please submit another sample if clinically indicated. Greater than 100,000 colony forming units per mL   POCT Urinalysis Dipstick (Automated)     Status: Abnormal   Collection Time: 05/31/19  4:44 PM  Result Value Ref Range   Color, UA yellow    Clarity, UA clear    Glucose, UA Negative Negative   Bilirubin, UA Negative    Ketones, UA Negative    Spec Grav, UA 1.020 1.010 - 1.025   Blood, UA Trace    pH, UA 5.0 5.0 - 8.0   Protein, UA Negative Negative   Urobilinogen, UA 0.2 0.2 or 1.0 E.U./dL   Nitrite, UA Negative    Leukocytes, UA Trace (A) Negative   Objective  Body mass index is 38.97 kg/m. Wt Readings from Last 3 Encounters:  06/06/19 248 lb 12.8 oz (112.9 kg)  05/31/19 248 lb (112.5 kg)  05/06/19 248 lb (112.5 kg)   Temp Readings from Last 3 Encounters:  06/06/19 (!) 96 F (35.6 C) (Temporal)  05/31/19 98.7 F (37.1 C) (Oral)  05/06/19 97.8 F (36.6 C) (Oral)   BP Readings from Last 3 Encounters:  06/06/19 130/84  05/31/19 103/70  05/06/19 (!) 142/85   Pulse Readings from Last 3 Encounters:  06/06/19 73  05/31/19 87  05/06/19 70    Physical Exam Vitals and nursing note reviewed.  Constitutional:      Appearance: Normal appearance. She is well-developed and well-groomed. She is obese.  HENT:     Head: Normocephalic and atraumatic.  Eyes:     Conjunctiva/sclera: Conjunctivae normal.     Pupils: Pupils are equal, round, and reactive to light.  Cardiovascular:     Rate and Rhythm: Normal rate and regular rhythm.     Heart sounds: Normal heart sounds. No murmur.  Pulmonary:     Effort: Pulmonary effort is normal.     Breath sounds: Normal breath sounds.  Skin:    General: Skin is warm and dry.  Neurological:     General: No focal deficit present.      Mental Status: She is alert and oriented to person, place, and time. Mental status is at baseline.     Gait: Gait normal.  Psychiatric:        Attention and Perception: Attention and perception normal.        Mood and Affect: Mood normal.        Speech: Speech normal.        Behavior: Behavior normal. Behavior is cooperative.        Thought Content: Thought content normal.        Cognition and Memory: Cognition and memory normal.        Judgment: Judgment normal.     Assessment  Plan  Chronic left shoulder pain - Plan: diclofenac Sodium (VOLTAREN) 1 % GEL, DG Clavicle Left, DG Shoulder Left  Osteoarthritis of left shoulder, unspecified osteoarthritis type - Plan: diclofenac Sodium (VOLTAREN) 1 % GEL, DG Clavicle Left, DG Shoulder Left  Benign neoplasm of left clavicle - Plan: DG Clavicle Left  Pelvic floor dysfunction - Plan: Ambulatory referral to Obstetrics / Gynecology  Essential hypertension - Plan: Comprehensive metabolic panel, Lipid panel, CBC with Differential/Platelet Cont meds   HM Flu shot utd 12/05/18 prevnar utd  pna 23 due had 12/03/13  Tdap per prior PCP had 12/19/16  consider shingrix in future  covid vx 2/2 had need to send proof  Out of age window pap s/p hysterectomy? What is left in I.e ovaries possibly b/l ovaries out Mammogram1/09/2018 negativeordered [place] pt can call to schedule  Colonoscopy8/18/18 negative Dr. Carlean Purl dexa 03/05/15 normal  Former smokerage 20-40 max 1ppd  Of note h/o anxiety depression PCP prior was going to start lexapro or celexashe is currently on cymbalta  Prior PCPs Dr. Lacinda Axon and Dr. Delfina Redwood in [facility]  Cards Dr. Tamala Julian Consider ob/gyn referral in future will 1st do CT ab/pelvis to w/u female pelvic pain disc Dr. Garwin Brothers referral  Provider: Dr. Olivia Mackie McLean-Scocuzza-Internal Medicine

## 2019-06-07 ENCOUNTER — Telehealth: Payer: Self-pay | Admitting: Internal Medicine

## 2019-06-07 DIAGNOSIS — M25512 Pain in left shoulder: Secondary | ICD-10-CM | POA: Insufficient documentation

## 2019-06-07 DIAGNOSIS — G8929 Other chronic pain: Secondary | ICD-10-CM | POA: Insufficient documentation

## 2019-06-07 HISTORY — DX: Other chronic pain: G89.29

## 2019-06-07 NOTE — Telephone Encounter (Signed)
Lm to call office to set up a fasting lab around 7/14 and follow up around 10/2018

## 2019-06-07 NOTE — Telephone Encounter (Signed)
Pt is scheduled for 7/15 for labs and 9/16 for follow up

## 2019-06-13 ENCOUNTER — Telehealth: Payer: Self-pay | Admitting: Internal Medicine

## 2019-06-13 NOTE — Telephone Encounter (Signed)
Prior authorization has been submitted for patient's Diclofenac Sodium 1% Gel.   Awaiting approval or denial.

## 2019-06-13 NOTE — Telephone Encounter (Signed)
Prior authorization has been denied. Denial letter placed on your desk.

## 2019-06-13 NOTE — Telephone Encounter (Signed)
Can buy otc voltaren gel to apply to joints that are painful   Monica [name]

## 2019-06-14 NOTE — Telephone Encounter (Signed)
Patient informed of Voltaren being denied and will get this OTC

## 2019-06-14 NOTE — Telephone Encounter (Signed)
PA faxed to [facility]

## 2019-06-19 ENCOUNTER — Telehealth: Payer: Medicare HMO | Admitting: Nurse Practitioner

## 2019-06-19 ENCOUNTER — Encounter: Payer: Self-pay | Admitting: Nurse Practitioner

## 2019-06-19 ENCOUNTER — Other Ambulatory Visit: Payer: Self-pay

## 2019-06-19 DIAGNOSIS — J019 Acute sinusitis, unspecified: Secondary | ICD-10-CM | POA: Diagnosis not present

## 2019-06-19 MED ORDER — DOXYCYCLINE HYCLATE 100 MG PO TABS: 100.0000 mg | ORAL_TABLET | Freq: Two times a day (BID) | ORAL | 0 refills | Status: DC

## 2019-06-19 MED ORDER — BENZONATATE 100 MG PO CAPS: 100.0000 mg | ORAL_CAPSULE | Freq: Three times a day (TID) | ORAL | 1 refills | Status: DC | PRN

## 2019-06-19 NOTE — Patient Instructions (Signed)
Please see your dentist about your tooth.  For your sinus pain, congestion, and cough I have ordered doxycycline antibiotic to take twice daily.  Take Tessalon Perles as needed for cough.  Please continue fluids, rest, monitor for fever.   If you develop worsening symptoms go to [place] for an examination.   You mentioned you are overdue for dental exam, after this infection clears please see dentist.

## 2019-06-19 NOTE — Progress Notes (Signed)
I connected with Monica [name] on 06/19/19 by telephone and verified that I am speaking with the correct person using two identifiers.   Location:  Patient: home Provider: work    I discussed the limitations, risks, security and privacy concerns of performing an evaluation and management service by telephone and the availability of in person appointments. I also discussed with the patient that there may be a patient responsible charge related to this service. The patient expressed understanding and agreed to proceed.    History of Present Illness:  Onset cold symptoms last Thurs night nose running and coughing.   On Friday,  she got up with chest cold congestion and productive of yellow sputum initially, no wheezing, SOB, or CP. Positive with sinus HA right side cheek bone and cough at night. It has moved from head into the chest. No fevers/chills/body aches. 179/89 and BP runs high if not sleeping well. Halls cough  Drops and  robitussin cough syrup not helping. Both Covid vaccines -yes , not sure of dates.   No exposure to a Covid patient. No fatigue and eating and drinking well. She has a chronic cracked tooth- not infected and is over due to get back to the dentist. She says she does not feel sick- it is more of an annoyance. " I would not be calling you if it weren't for my daughter." No edema.     Past Medical History:  Diagnosis Date  . Allergic rhinitis   . Anxiety   . Benign neoplasm of breast 2013  . Cardiac arrhythmia   . Cardiomegaly   . Cataract   . CHF (congestive heart failure) ([place])   . Compulsive eating patterns   . Compulsive overeating   . Depression   . Diastolic heart failure (HCC)    LVH with free wall thickness 1.4 cm EF 60% Dr. Tamala Julian   . Diffuse cystic mastopathy 2013  . Edema   . Epistaxis    noted 12/20/18   . GI bleeding    in ~2018   . Gout   . Hiatal hernia   . HTN (hypertension)   . Hyperlipidemia   . Lump or mass in breast 2012  . OA  (osteoarthritis) of knee    with injections  . Obesity   . OSA (obstructive sleep apnea)    not on cpap  . Other abnormal glucose   . Peptic stricture of esophagus   . Personal history of tobacco use, presenting hazards to health   . Sciatica   . Sinus infection 2010  . Spinal stenosis    lumbar   . Stroke ([place])   . Unspecified sleep apnea      Social History   Socioeconomic History  . Marital status: Widowed    Spouse name: Not on file  . Number of children: 1  . Years of education: Not on file  . Highest education level: Not on file  Occupational History  . Occupation: Retired Ship broker: RETIRED  Tobacco Use  . Smoking status: Former Smoker    Packs/day: 0.50    Years: 20.00    Pack years: 10.00    Types: Cigarettes  . Smokeless tobacco: Never Used  . Tobacco comment: quit approx. 20 years ago  Substance and Sexual Activity  . Alcohol use: Yes    Alcohol/week: 0.0 standard drinks    Comment: wine occasional  . Drug use: No  . Sexual activity: Never  Other Topics Concern  .  Not on file  Social History Narrative   Widowed x 30+ years as of 12/2018    1 daughter. 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 Difficulty of Paying Living Expenses:   Food Insecurity:   . Worried About Charity fundraiser in the Last Year:   . Arboriculturist in the Last Year:   Transportation Needs:   . Film/video editor (Medical):   Marland Kitchen Lack of Transportation (Non-Medical):   Physical Activity:   . Days of Exercise per Week:   . Minutes of Exercise per Session:   Stress:   . Feeling of Stress :   Social Connections:   . Frequency of Communication with Friends and Family:   . Frequency of Social Gatherings with Friends and Family:   . Attends Religious Services:   . Active Member of Clubs or Organizations:   . Attends Theatre manager Meetings:   Marland Kitchen Marital Status:   Intimate Partner Violence:   . Fear of Current or Ex-Partner:   . Emotionally Abused:   Marland Kitchen Physically Abused:   . Sexually Abused:     Past Surgical History:  Procedure Laterality Date  . adenosine cardiolite  1/08   low risk   . APPENDECTOMY    . BREAST BIOPSY     02/28/11 negative  . BREAST LUMPECTOMY     right x1 ('89) left x2 ('70s, '90)  . CARDIAC CATHETERIZATION  2001   normal per pt  . COLONOSCOPY  11/02   diverticulosis  . COLONOSCOPY  9/06   diverticulosis, hemorhoids  . COLONOSCOPY N/A 10/01/2016   Procedure: COLONOSCOPY;  Surgeon: Gatha Mayer, MD;  [facility]: [facility];  Service: Endoscopy;  Laterality: N/A;  . dexa  3/02   normal  . EYE SURGERY  2012   cataract  . IR CT HEAD LTD  09/01/2018  . IR PERCUTANEOUS ART THROMBECTOMY/INFUSION INTRACRANIAL INC DIAG ANGIO  09/01/2018  . JOINT REPLACEMENT     knee b/l 2011  . knee replacement Right 9/11   R. Dr. Telford Nab  . RADIOLOGY WITH ANESTHESIA N/A 09/01/2018   Procedure: IR WITH ANESTHESIA;  Surgeon: Luanne Bras, MD;  Location: [facility];  Service: Radiology;  Laterality: N/A;  . sleep study  5/05  . TONSILLECTOMY    . TOTAL ABDOMINAL HYSTERECTOMY     fibroids, age of 59  . TOTAL KNEE ARTHROPLASTY Left 2012  . US TRANSVAGINAL PELVIC MODIFIED  2000, 2001    Family History  Problem Relation Age of Onset  . Stroke Father   . Hypertension Mother        (a lot of animosity in their relationship)  . Heart failure Mother   . Gout Other        whole family   . Prostate cancer Brother   . Cancer Brother        prostate   . Hepatitis Brother        Hep C; alcoholism (terminal)  . Drug abuse Brother        died 13  . Breast cancer Sister   . Cancer Sister        breast cancer  . Other Brother        drug addiction    Allergies  Allergen Reactions  . Buspirone Hcl Other (See Comments)  REACTION: made her sleepy, ? swollen legs  . Lisinopril Other (See  Comments)    REACTION: dizziness  . Prozac [Fluoxetine Hcl] Other (See Comments)    sleepy    Current Outpatient Medications on File Prior to Visit  Medication Sig Dispense Refill  . allopurinol (ZYLOPRIM) 100 MG tablet TAKE 2 TABLETS BY MOUTH ONCE A DAY 180 tablet 3  . ALPRAZolam (XANAX) 0.25 MG tablet Take 1 tablet (0.25 mg total) by mouth 2 (two) times daily as needed for anxiety. 25 tablet 0  . Ascorbic Acid (VITAMIN C PO) Take by mouth.    . Cholecalciferol (VITAMIN D3 PO) Take by mouth.    . colchicine 0.6 MG tablet Take 1 tablet (0.6 mg total) by mouth daily.    . diclofenac Sodium (VOLTAREN) 1 % GEL Apply 2 g topically 4 (four) times daily as needed. 150 g 11  . DULoxetine (CYMBALTA) 30 MG capsule Take 1 capsule (30 mg total) by mouth daily. 90 capsule 3  . ELIQUIS 5 MG TABS tablet TAKE 1 TABLET BY MOUTH TWICE A DAY 60 tablet 10  . furosemide (LASIX) 40 MG tablet Take 1 tablet (40 mg total) by mouth daily. 90 tablet 3  . gabapentin (NEURONTIN) 300 MG capsule Take 1 capsule (300 mg total) by mouth 2 (two) times daily. 180 capsule 3  . HYDROcodone-acetaminophen (NORCO/VICODIN) 5-325 MG tablet Take 1 tablet by mouth 2 (two) times daily as needed for moderate pain. 20 tablet 0  . lovastatin (MEVACOR) 20 MG tablet Take 1 tablet (20 mg total) by mouth daily at 6 PM. 90 tablet 3  . Magnesium 250 MG TABS Take 250 mg by mouth daily.    . metoprolol succinate (TOPROL-XL) 100 MG 24 hr tablet TAKE 1 TABLET BY MOUTH DAILY WITH OR IMMEDIATLY FOLLOWING A MEAL 90 tablet 3  . Multiple Vitamins-Minerals (MULTIVITAMIN WOMEN 50+ PO) Take by mouth.    . potassium chloride SA (KLOR-CON) 10 MEQ tablet Take 1 tablet (10 mEq total) by mouth daily. 90 tablet 3  . spironolactone (ALDACTONE) 25 MG tablet Take 0.5 tablets (12.5 mg total) by mouth daily. 45 tablet 3  . amLODipine (NORVASC) 5 MG tablet Take 1 tablet (5 mg total) by mouth daily. 180 tablet 3   No current facility-administered medications on file  prior to visit.    Ht 5\' 7"  (1.702 m)   Wt 248 lb (112.5 kg)   BMI 38.84 kg/m   Observations/Objective:   VITALS per patient if applicable:   GENERAL: Patient sounds alert,  oriented,  and in no acute distress   HEENT: Congested, hoarse voice, very conversive    NECK: normal movements of the head and neck   LUNGS: Coughing during the interview, no signs of respiratory distress, talking in full sentences, no breathiness, no wheezing  PSYCH/NEURO: pleasant and cooperative, no obvious depression or anxiety, speech and thought processing grossly intact  Assessment and Plan  Acute sinusitis with cough. No COPD, asthma and or  wheezing, chest tightness or CP. No fever/chills or body aches. No loss of appetite, taste or GI symptoms.  Do not suspect Covid. Do not suspect pneumonia. Early bronchitis is likely along with the sinusitis.  Patient advised: For your sinus pain, congestion, and cough I have ordered doxycycline antibiotic to take twice daily. Take Tessalon Perles as needed for cough. Please continue fluids, rest, monitor for fever. Avoiding decongestants given HTN. Advised to take her BP meds.    If you develop worsening symptoms go  to [facility] for an examination.  You mentioned you are overdue for dental exam, after this infection clears please see dentist. Call us on Fri with symptom  Denice Paradise, NP update.   Follow Up Instructions:    I discussed the assessment and treatment plan with the patient. The patient was provided an opportunity to ask questions and all were answered. The patient agreed with the plan and demonstrated an understanding of the instructions.     The patient was advised to call back or seek an in-person evaluation if the symptoms worsen or if the condition fails to improve as anticipated.   I provided 23 minutes of non-face-to-face time during this encounter.

## 2019-06-20 ENCOUNTER — Telehealth: Payer: Self-pay | Admitting: Nurse Practitioner

## 2019-06-20 NOTE — Telephone Encounter (Signed)
Spoke with patient and advised patient to get COVID tested; number to call for appt was given to patient. Patient agreed to call to get tested. Patient advised of below guidelines.

## 2019-06-20 NOTE — Telephone Encounter (Signed)
Please call her Dr. Nicki Reaper and I discussed her case and she does recommend Monica [name] get a COVID test.  Call [phone] to make an appointment.   She will need to wear a mask if someone drives her. And her passenger will need to wear a mask. She will need to be home quarantined until the results are known.    Advised of CDC guidelines for self isolation/ ending isolation.  Advised of safe practice guidelines. Symptom Tier reviewed.  Encouraged to monitor for any worsening symptoms; watch for increased shortness of breath, weakness, and signs of dehydration. Advised when to seek emergency care.  Instructed to rest and hydrate well.  Advised to leave the house during recommended isolation period, only if it is necessary to seek medical care

## 2019-06-21 NOTE — Telephone Encounter (Signed)
Faxed [place] [facility] for [facility]

## 2019-06-21 NOTE — Telephone Encounter (Signed)
Spoke with patient and advised her on below message. Patient states she will get her daughter to take her to the clinic today if she is able to; she is her transportation.

## 2019-06-21 NOTE — Telephone Encounter (Signed)
She needs to go to the [facility] today to get seen in person. They can run a Covid test, as well. She needs her lungs examined today.

## 2019-06-21 NOTE — Telephone Encounter (Signed)
Pt called and said that her cough has not got any better and wanted to know what else could be done

## 2019-06-21 NOTE — Telephone Encounter (Signed)
Spoke with patient and she is still having a hard time with her cough. She has started the medication that was sent in. Patient has not been able to get COVID tested yet but thinks she has an appt for Monday. She is going to call them back to verify and call us back with test date.

## 2019-06-24 ENCOUNTER — Ambulatory Visit: Payer: Medicare HMO | Attending: Internal Medicine

## 2019-06-24 DIAGNOSIS — Z20822 Contact with and (suspected) exposure to covid-19: Secondary | ICD-10-CM

## 2019-06-25 LAB — SARS-COV-2, NAA 2 DAY TAT

## 2019-06-25 LAB — NOVEL CORONAVIRUS, NAA: SARS-CoV-2, NAA: NOT DETECTED

## 2019-06-26 ENCOUNTER — Encounter: Payer: Self-pay | Admitting: Adult Health

## 2019-06-26 ENCOUNTER — Ambulatory Visit: Payer: Medicare HMO | Admitting: Adult Health

## 2019-06-26 VITALS — BP 152/82 | HR 106 | Ht 67.0 in | Wt 248.0 lb

## 2019-06-26 DIAGNOSIS — I82403 Acute embolism and thrombosis of unspecified deep veins of lower extremity, bilateral: Secondary | ICD-10-CM

## 2019-06-26 DIAGNOSIS — I4892 Unspecified atrial flutter: Secondary | ICD-10-CM

## 2019-06-26 DIAGNOSIS — I63412 Cerebral infarction due to embolism of left middle cerebral artery: Secondary | ICD-10-CM

## 2019-06-26 DIAGNOSIS — I10 Essential (primary) hypertension: Secondary | ICD-10-CM | POA: Diagnosis not present

## 2019-06-26 NOTE — Patient Instructions (Addendum)
Continue Eliquis (apixaban) daily  for secondary stroke prevention  Continue to follow up with PCP regarding cholesterol, and blood pressure management   Continue to follow with cardiology for atrial fibrillation management and eliquis monitoring  You will be called to schedule lower extremity ultra sound for follow up monitoring   Continue to monitor blood pressure at home  Maintain strict control of hypertension with blood pressure goal below 130/90, diabetes with hemoglobin A1c goal below 6.5% and cholesterol with LDL cholesterol (bad cholesterol) goal below 70 mg/dL. I also advised the patient to eat a healthy diet with plenty of whole grains, cereals, fruits and vegetables, exercise regularly and maintain ideal body weight.   Followup in the future with me in 6 months or call earlier if needed       Thank you for coming to see [place] at [facility]. I hope we have been able to provide you high quality care today.  You may receive a patient satisfaction survey over the next few weeks. We would appreciate your feedback and comments so that we may continue to improve ourselves and the health of our patients.

## 2019-06-26 NOTE — Progress Notes (Signed)
[facility] [address] [place]. [place] [address] [phone]       STROKE FOLLOW UP NOTE  [name] Date of Birth:  May 13, 1935 Medical Record Number:  [id number]   Reason for Referral: Left MCA stroke 08/2018    CHIEF COMPLAINT:  Chief Complaint  Patient presents with  . Follow-up    hx of stroke. Tx room    HPI:    Today, 06/26/2019, Monica [name] returns for stroke follow-up accompianed by her daughter.  She has been stable since prior visit without residual deficits and denies new or recurrent stroke/TIA symptoms.  Continues on Eliquis with history of atrial fibrillation and secondary stroke prevention without side effects. Blood pressure today 152/82.  No neurological concerns at this time.      History provided for reference purposes only Update 12/26/2018: Monica [name] is an 84 year old female who is being seen today for stroke follow-up.  She has been stable from a stroke standpoint without new or reoccurring stroke/TIA symptoms.  Continues on Eliquis without bleeding or bruising.  Ongoing follow-up with cardiology regularly.  At prior visit, patient's main concern was low back pain with radiculopathy limiting functional ability.  Initiated gabapentin at prior visit and recommended follow-up with established orthopedic provider.  Continue to experience pain despite gabapentin.  Due to ongoing pain, she requested referral to neurosurgery for further evaluation and potential treatment options.  She states her only option was back surgery which she is not interested at this time due to recent stroke and anticoagulation.  She continues to take gabapentin and does endorse improvement and benefit with ongoing use.  Denies side effects.  Due to decreased pain with gabapentin, she has slowly increased activity and daily functioning.  Continues to use a cane for ambulation and denies any recent falls.  Continues to live independently but daughter will check  on her frequently along with other family members.  She endorses improvement of her overall mood.  Cardiology started her on Xanax 0.25 mg daily as needed for anxiety but only had to take 1 time and has not had any recurrent anxiety symptoms.  Recently initiated care with new PCP Dr. Terese Door.  She is questioning potential return to driving.  No further concerns at this time.  Initial visit 10/25/2018: Monica [name] is being seen today for hospital follow-up and is accompanied by her daughter.  She has recovered well from a stroke standpoint without residual deficits.  She has since completed home health therapies.  She continues on Eliquis without bleeding or bruising for atrial flutter and secondary stroke prevention along with treatment of bilateral DVT.  She continues on amiodarone taper and plans on doing 30-day cardiac event monitor after 11/2 (amioderone will be completed at that time).  She was initially staying with her daughter at hospital discharge but has since returned home.  She has been having difficulty with bilateral lower back pain with radiculopathy R>L leg pain.  Pain consists of stabbing sensation which is worsened with ambulation and progressively worsening.  She previously was being seen by Ortho with injections.  She had trialed gabapentin in the past which she tolerated well but would only use intermittently.  Prior MRI lumbar spine and 08/2017 showed multiple levels of spinal stenosis and degenerative changes.  Initially upon hospital discharge, she continued to have right-sided weakness therefore relying heavily on left side for ambulation.  She has not contacted her establish orthopedic provider, Dr. Tamala Julian, in regards to her worsening back pain.  She has also been experiencing increased anxiety and depression since returning home which she believes is more due to her constant pain which has been limiting mobility and functioning.  No further concerns at this time.  Denies new or  worsening stroke/TIA symptoms.  Stroke admission 09/01/2018: Monica [name] a 84 y.o.femalewith history of hypertension, hyperlipidemia, obesity, remote GI bleeding who presented to [facility] ED on 09/01/2018 with sudden onset aphasia and right hemiparesis.  Neurology consulted with stroke work-up revealing left MCA punctate small infarct due to left M1 occlusion status post TPA and IR with TICI 3 reperfusion embolic pattern in setting of bilateral DVT and new onset A. Fib.  She received TPA without complication.  MRI showed left MCA infarcts.  CTA head/neck showed occlusion of distal M1 with intermediate collateralization, bilateral ICA bulb arthrosclerosis and right VA origin stenosis.  2D echo normal EF.  Lower extremity venous Doppler showed right PTV, left popliteal and bilateral peroneal vein acute DVT.  Eliquis initiated for treatment of DVT and new onset atrial fibrillation.  No evidence of HLD with LDL 66 or DM with A1c 6.2.  HTN stable.  Due to atrial fibrillation/atrial flutter with RVR, amiodarone drip initiated with conversion to NSR and recommended continuation of amiodarone with tapering schedule for 6 to 12 weeks and follow-up with cardiology outpatient.  Other stroke risk factors include advanced age, former tobacco use, EtOH use, obesity, family history of stroke and OSA.  Discharged home in stable condition recommendation home health therapies.    ROS:   14 system review of systems performed and negative with exception of no complaints  PMH:  Past Medical History:  Diagnosis Date  . Allergic rhinitis   . Anxiety   . Benign neoplasm of breast 2013  . Cardiac arrhythmia   . Cardiomegaly   . Cataract   . CHF (congestive heart failure) ([place])   . Compulsive eating patterns   . Compulsive overeating   . Depression   . Diastolic heart failure (HCC)    LVH with free wall thickness 1.4 cm EF 60% Dr. Tamala Julian   . Diffuse cystic mastopathy 2013  . Edema   . Epistaxis    noted  12/20/18   . GI bleeding    in ~2018   . Gout   . Hiatal hernia   . HTN (hypertension)   . Hyperlipidemia   . Lump or mass in breast 2012  . OA (osteoarthritis) of knee    with injections  . Obesity   . OSA (obstructive sleep apnea)    not on cpap  . Other abnormal glucose   . Peptic stricture of esophagus   . Personal history of tobacco use, presenting hazards to health   . Sciatica   . Sinus infection 2010  . Spinal stenosis    lumbar   . Stroke ([place])   . Unspecified sleep apnea     PSH:  Past Surgical History:  Procedure Laterality Date  . adenosine cardiolite  1/08   low risk   . APPENDECTOMY    . BREAST BIOPSY     02/28/11 negative  . BREAST LUMPECTOMY     right x1 ('89) left x2 ('70s, '90)  . CARDIAC CATHETERIZATION  2001   normal per pt  . COLONOSCOPY  11/02   diverticulosis  . COLONOSCOPY  9/06   diverticulosis, hemorhoids  . COLONOSCOPY N/A 10/01/2016   Procedure: COLONOSCOPY;  Surgeon: Gatha Mayer, MD;  Location: [facility];  Service: Endoscopy;  Laterality:  N/A;  . dexa  3/02   normal  . EYE SURGERY  2012   cataract  . IR CT HEAD LTD  09/01/2018  . IR PERCUTANEOUS ART THROMBECTOMY/INFUSION INTRACRANIAL INC DIAG ANGIO  09/01/2018  . JOINT REPLACEMENT     knee b/l 2011  . knee replacement Right 9/11   R. Dr. Telford Nab  . RADIOLOGY WITH ANESTHESIA N/A 09/01/2018   Procedure: IR WITH ANESTHESIA;  Surgeon: Luanne Bras, MD;  Location: [facility];  Service: Radiology;  Laterality: N/A;  . sleep study  5/05  . TONSILLECTOMY    . TOTAL ABDOMINAL HYSTERECTOMY     fibroids, age of 64  . TOTAL KNEE ARTHROPLASTY Left 2012  . US TRANSVAGINAL PELVIC MODIFIED  2000, 2001    Social History:  Social History   Socioeconomic History  . Marital status: Widowed    Spouse name: Not on file  . Number of children: 1  . Years of education: Not on file  . Highest education level: Not on file  Occupational History  . Occupation: Retired Personnel officer: RETIRED  Tobacco Use  . Smoking status: Former Smoker    Packs/day: 0.50    Years: 20.00    Pack years: 10.00    Types: Cigarettes  . Smokeless tobacco: Never Used  . Tobacco comment: quit approx. 20 years ago  Substance and Sexual Activity  . Alcohol use: Yes    Alcohol/week: 0.0 standard drinks    Comment: wine occasional  . Drug use: No  . Sexual activity: Never  Other Topics Concern  . Not on file  Social History Narrative   Widowed x 30+ years as of 12/2018    1 daughter. 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 Difficulty of Paying Living Expenses:   Food Insecurity:   . Worried About Charity fundraiser in the Last Year:   . Arboriculturist in the Last Year:   Transportation Needs:   . Film/video editor (Medical):   Marland Kitchen Lack of Transportation (Non-Medical):   Physical Activity:   . Days of Exercise per Week:   . Minutes of Exercise per Session:   Stress:   . Feeling of Stress :   Social Connections:   . Frequency of Communication with Friends and Family:   . Frequency of Social Gatherings with Friends and Family:   . Attends Religious Services:   . Active Member of Clubs or Organizations:   . Attends Archivist Meetings:   Marland Kitchen Marital Status:   Intimate Partner Violence:   . Fear of Current or Ex-Partner:   . Emotionally Abused:   Marland Kitchen Physically Abused:   . Sexually Abused:     Family History:  Family History  Problem Relation Age of Onset  . Stroke Father   . Hypertension Mother        (a lot of animosity in their relationship)  . Heart failure Mother   . Gout Other        whole family   . Prostate cancer Brother   . Cancer Brother        prostate   . Hepatitis Brother        Hep C; alcoholism (terminal)  . Drug abuse Brother        died 8  . Breast cancer  Sister   . Cancer Sister        breast  cancer  . Other Brother        drug addiction    Medications:   Current Outpatient Medications on File Prior to Visit  Medication Sig Dispense Refill  . allopurinol (ZYLOPRIM) 100 MG tablet TAKE 2 TABLETS BY MOUTH ONCE A DAY 180 tablet 3  . ALPRAZolam (XANAX) 0.25 MG tablet Take 1 tablet (0.25 mg total) by mouth 2 (two) times daily as needed for anxiety. 25 tablet 0  . amLODipine (NORVASC) 5 MG tablet Take 1 tablet (5 mg total) by mouth daily. 180 tablet 3  . Ascorbic Acid (VITAMIN C PO) Take by mouth.    . benzonatate (TESSALON PERLES) 100 MG capsule Take 1 capsule (100 mg total) by mouth 3 (three) times daily as needed for cough. 30 capsule 1  . Cholecalciferol (VITAMIN D3 PO) Take by mouth.    . colchicine 0.6 MG tablet Take 1 tablet (0.6 mg total) by mouth daily.    . diclofenac Sodium (VOLTAREN) 1 % GEL Apply 2 g topically 4 (four) times daily as needed. 150 g 11  . doxycycline (VIBRA-TABS) 100 MG tablet Take 1 tablet (100 mg total) by mouth 2 (two) times daily. 14 tablet 0  . DULoxetine (CYMBALTA) 30 MG capsule Take 1 capsule (30 mg total) by mouth daily. 90 capsule 3  . ELIQUIS 5 MG TABS tablet TAKE 1 TABLET BY MOUTH TWICE A DAY 60 tablet 10  . furosemide (LASIX) 40 MG tablet Take 1 tablet (40 mg total) by mouth daily. 90 tablet 3  . gabapentin (NEURONTIN) 300 MG capsule Take 1 capsule (300 mg total) by mouth 2 (two) times daily. 180 capsule 3  . HYDROcodone-acetaminophen (NORCO/VICODIN) 5-325 MG tablet Take 1 tablet by mouth 2 (two) times daily as needed for moderate pain. 20 tablet 0  . lovastatin (MEVACOR) 20 MG tablet Take 1 tablet (20 mg total) by mouth daily at 6 PM. 90 tablet 3  . Magnesium 250 MG TABS Take 250 mg by mouth daily.    . metoprolol succinate (TOPROL-XL) 100 MG 24 hr tablet TAKE 1 TABLET BY MOUTH DAILY WITH OR IMMEDIATLY FOLLOWING A MEAL 90 tablet 3  . Multiple Vitamins-Minerals (MULTIVITAMIN WOMEN 50+ PO) Take by mouth.    . potassium chloride SA (KLOR-CON) 10  MEQ tablet Take 1 tablet (10 mEq total) by mouth daily. 90 tablet 3  . spironolactone (ALDACTONE) 25 MG tablet Take 0.5 tablets (12.5 mg total) by mouth daily. 45 tablet 3   No current facility-administered medications on file prior to visit.    Allergies:   Allergies  Allergen Reactions  . Buspirone Hcl Other (See Comments)    REACTION: made her sleepy, ? swollen legs  . Lisinopril Other (See Comments)    REACTION: dizziness  . Prozac [Fluoxetine Hcl] Other (See Comments)    sleepy     Physical Exam  Vitals:   06/26/19 1352  BP: (!) 152/82  Pulse: (!) 106  Weight: 248 lb (112.5 kg)  Height: 5\' 7"  (1.702 m)   Body mass index is 38.84 kg/m. No exam data present  General: Obese pleasant elderly female, seated, in no evident distress Head: head normocephalic and atraumatic.   Neck: supple with no carotid or supraclavicular bruits Cardiovascular: regular rate and rhythm, no murmurs Musculoskeletal: no deformity Skin:  no rash/petichiae Vascular:  Normal pulses all extremities   Neurologic Exam Mental Status: Awake and fully alert. fluent  speech and language. Oriented to place and time. Recent and remote memory intact. Attention span, concentration and fund of knowledge appropriate. Mood and affect appropriate.  Cranial Nerves:  Pupils equal, briskly reactive to light. Extraocular movements full without nystagmus. Visual fields full to confrontation. Hearing intact. Facial sensation intact. Face, tongue, palate moves normally and symmetrically.  Motor: Normal bulk and tone.  Normal weakness throughout all tested extremities Sensory.: intact to touch , pinprick , position and vibratory sensation.  Coordination: Rapid alternating movements normal in all extremities. Finger-to-nose performed accurately bilaterally.   Gait and Station: Arises from chair without difficulty. Stance is slightly hunched.  Gait demonstrates normal stride length and balance.  Reflexes: 1+ and  symmetric. Toes downgoing.        ASSESSMENT: Monica [name] is a 84 y.o. year old female presented to [facility] ED with sudden onset aphasia and right hemiparesis on 09/01/2018 with stroke work-up revealing left MCA punctate small infarct due to left M1 occlusion status post TPA and IR with TICI 3 reperfusion embolic pattern secondary to bilateral DVT and new onset atrial flutter. Vascular risk factors include new onset atrial flutter, bilateral DVT, HTN, chronic diastolic HF, advanced age, former tobacco use, EtOH use, obesity and OSA.  Stable from stroke standpoint without residual deficits    PLAN:  1. Left MCA infarcts: Continue Eliquis (apixaban) daily  for secondary stroke prevention. Maintain strict control of hypertension with blood pressure goal below 130/90, diabetes with hemoglobin A1c goal below 6.5% and cholesterol with LDL cholesterol (bad cholesterol) goal below 70 mg/dL.  I also advised the patient to eat a healthy diet with plenty of whole grains, cereals, fruits and vegetables, exercise regularly with at least 30 minutes of continuous activity daily and maintain ideal body weight. 2. HTN: Advised to continue current treatment regimen.  Advised to continue to monitor at home along with continued follow-up with PCP for management 3. Atrial flutter: Continue Eliquis along with ongoing follow-up with cardiology 4. Acute bilateral DVT: Continue treatment with Eliquis - order placed to repeat lower extremity ultrasound for follow up of prior acute bilateral DVTs   Follow up in 6 months or call earlier if needed   Greater than 50% of time during this 25 minute visit was spent on counseling, explanation of diagnosis of left MCA infarcts, reviewing risk factor management of HTN, atrial flutter, bilateral DVT, discussion regarding back pain with radiculopathy and ongoing use of gabapentin and possible aquatic therapy, planning of further management along with potential future management,  and answered all questions to patient satisfaction    Frann Rider, AGNP-BC  [facility] [address] [place], [place] [address]  Phone [phone] Fax [phone] Note: This document was prepared with digital dictation and possible smart phrase technology. Any transcriptional errors that result from this process are unintentional.

## 2019-06-28 NOTE — Progress Notes (Signed)
I agree with the above plan 

## 2019-07-01 ENCOUNTER — Other Ambulatory Visit: Payer: Self-pay

## 2019-07-01 ENCOUNTER — Ambulatory Visit (HOSPITAL_COMMUNITY)
Admission: RE | Admit: 2019-07-01 | Discharge: 2019-07-01 | Disposition: A | Discharge status: Home / Self Care | Payer: Medicare HMO | Source: Ambulatory Visit | Attending: Adult Health | Admitting: Adult Health

## 2019-07-01 DIAGNOSIS — I82403 Acute embolism and thrombosis of unspecified deep veins of lower extremity, bilateral: Secondary | ICD-10-CM | POA: Diagnosis not present

## 2019-07-01 NOTE — Progress Notes (Signed)
Bilateral lower extremity venous duplex completed. Refer to "CV Proc" under chart review to view preliminary results.  07/01/2019 11:56 AM Kelby [name]., MHA, RVT, RDCS, RDMS

## 2019-07-02 ENCOUNTER — Telehealth: Payer: Self-pay | Admitting: *Deleted

## 2019-07-02 NOTE — Telephone Encounter (Signed)
-----   Message from Frann Rider, NP sent at 07/02/2019  7:15 AM EDT ----- Please advise patient that recent lower extremity ultrasound showed resolution of prior DVT.  She will continue Eliquis due to evidence of atrial fibrillation

## 2019-07-10 ENCOUNTER — Ambulatory Visit (INDEPENDENT_AMBULATORY_CARE_PROVIDER_SITE_OTHER): Payer: Medicare HMO | Admitting: Obstetrics and Gynecology

## 2019-07-10 ENCOUNTER — Encounter: Payer: Self-pay | Admitting: Obstetrics and Gynecology

## 2019-07-10 ENCOUNTER — Other Ambulatory Visit: Payer: Self-pay

## 2019-07-10 VITALS — BP 146/74 | HR 84 | Ht 67.0 in | Wt 247.6 lb

## 2019-07-10 DIAGNOSIS — N8110 Cystocele, unspecified: Secondary | ICD-10-CM | POA: Diagnosis not present

## 2019-07-10 DIAGNOSIS — N816 Rectocele: Secondary | ICD-10-CM

## 2019-07-10 DIAGNOSIS — R102 Pelvic and perineal pain: Secondary | ICD-10-CM | POA: Diagnosis not present

## 2019-07-10 NOTE — Progress Notes (Signed)
Pt present due to pelvic floor dysfunction. Pt stated that she has been noticing a lot of pressure and pain in the pelvic area and vaginal area.

## 2019-07-10 NOTE — Progress Notes (Signed)
GYNECOLOGY CLINIC PROGRESS NOTE Subjective:    Monica [name] is a 84 y.o. G58P1011 female who presents for complaints of pelvic pain for the past several months. She states that sh feels like something is falling from the vagina.  Approximately 1 month ago she reports she had some irritation of the labia.  Saw a tinge of blood when wiping. Was seen by an Urgent Care facility and was told it might have been folliculitis or a boil, and was started on antibiotics.  Notes this has resolved.   Patient additionally desires to report that she still notes some occasional "stirrings" after watching a sex scene from a movie although she thought she did not have those sorts of feelings anymore. Notes later waking up later that night from a dream and felt like something had penetrated her (notes it was a very unpleasant feeling). Lives alone, and has been celibate for ~ 30 years since her husband passed.   Reports abdominal swelling that seems to come and go. Concerned about ovarian cancer as she has someone close to her that was diagnosed not long ago in her 36s. Is up to date on mammogram. She reports that she had a hysterectomy around age 67, but ovaries remain to her knowledge.  Menstrual History: OB History    Gravida  2   Para  1   Term  1   Preterm      AB  1   Living  1     SAB  1   TAB      Ectopic      Multiple      Live Births  1        Obstetric Comments  Age with first menstruation-24 Age with first pregnancy-22 LMP-age 70, hysterectomy        Gynecologic History: Last pap smear: no longer needed.  No LMP recorded. Patient has had a hysterectomy.  Last mammogram: 04/05/2019 Last Dexa Scan: 03/05/2015.  Last Colonoscopy: 10/01/2016.    Past Medical History:  Diagnosis Date  . Allergic rhinitis   . Anxiety   . Benign neoplasm of breast 2013  . Cardiac arrhythmia   . Cardiomegaly   . Cataract   . CHF (congestive heart failure) ([place])   . Compulsive  eating patterns   . Compulsive overeating   . Depression   . Diastolic heart failure ([facility])    LVH with free wall thickness 1.4 cm EF 60% Dr. Tamala Julian   . Diffuse cystic mastopathy 2013  . Edema   . Epistaxis    noted 12/20/18   . GI bleeding    in ~2018   . Gout   . Hiatal hernia   . HTN (hypertension)   . Hyperlipidemia   . Lump or mass in breast 2012  . OA (osteoarthritis) of knee    with injections  . Obesity   . OSA (obstructive sleep apnea)    not on cpap  . Other abnormal glucose   . Peptic stricture of esophagus   . Personal history of tobacco use, presenting hazards to health   . Sciatica   . Sinus infection 2010  . Spinal stenosis    lumbar   . Stroke ([place])   . Unspecified sleep apnea     Family History  Problem Relation Age of Onset  . Stroke Father   . Hypertension Mother        (a lot of animosity in their relationship)  . Heart failure Mother   .  Gout Other        whole family   . Prostate cancer Brother   . Cancer Brother        prostate   . Hepatitis Brother        Hep C; alcoholism (terminal)  . Drug abuse Brother        died 29  . Breast cancer Sister   . Cancer Sister        breast cancer  . Other Brother        drug addiction    Past Surgical History:  Procedure Laterality Date  . adenosine cardiolite  1/08   low risk   . APPENDECTOMY    . BREAST BIOPSY     02/28/11 negative  . BREAST LUMPECTOMY     right x1 ('89) left x2 ('70s, '90)  . CARDIAC CATHETERIZATION  2001   normal per pt  . COLONOSCOPY  11/02   diverticulosis  . COLONOSCOPY  9/06   diverticulosis, hemorhoids  . COLONOSCOPY N/A 10/01/2016   Procedure: COLONOSCOPY;  Surgeon: Gatha Mayer, MD;  Location: [facility];  Service: Endoscopy;  Laterality: N/A;  . dexa  3/02   normal  . EYE SURGERY  2012   cataract  . IR CT HEAD LTD  09/01/2018  . IR PERCUTANEOUS ART THROMBECTOMY/INFUSION INTRACRANIAL INC DIAG ANGIO  09/01/2018  . JOINT REPLACEMENT     knee b/l 2011  .  knee replacement Right 9/11   R. Dr. Telford Nab  . RADIOLOGY WITH ANESTHESIA N/A 09/01/2018   Procedure: IR WITH ANESTHESIA;  Surgeon: Luanne Bras, MD;  Location: [facility];  Service: Radiology;  Laterality: N/A;  . sleep study  5/05  . TONSILLECTOMY    . TOTAL ABDOMINAL HYSTERECTOMY     fibroids, age of 45  . TOTAL KNEE ARTHROPLASTY Left 2012  . US TRANSVAGINAL PELVIC MODIFIED  2000, 2001    Social History   Socioeconomic History  . Marital status: Widowed    Spouse name: Not on file  . Number of children: 1  . Years of education: Not on file  . Highest education level: Not on file  Occupational History  . Occupation: Retired Ship broker: RETIRED  Tobacco Use  . Smoking status: Former Smoker    Packs/day: 0.50    Years: 20.00    Pack years: 10.00    Types: Cigarettes  . Smokeless tobacco: Never Used  . Tobacco comment: quit approx. 20 years ago  Substance and Sexual Activity  . Alcohol use: Yes    Alcohol/week: 0.0 standard drinks    Comment: wine occasional  . Drug use: No  . Sexual activity: Never  Other Topics Concern  . Not on file  Social History Narrative   Widowed x 30+ years as of 12/2018    1 daughter. 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 Difficulty of Paying Living Expenses:   Food Insecurity:   . Worried About Charity fundraiser in the Last Year:   . Arboriculturist in the Last Year:   Transportation Needs:   . Film/video editor (Medical):   Marland Kitchen Lack of Transportation (Non-Medical):   Physical Activity:   . Days of Exercise per Week:   . Minutes of Exercise per Session:   Stress:   . Feeling of Stress :  Social Connections:   . Frequency of Communication with Friends and Family:   . Frequency of Social Gatherings with Friends and Family:   . Attends Religious Services:   . Active Member of Clubs  or Organizations:   . Attends Archivist Meetings:   Marland Kitchen Marital Status:   Intimate Partner Violence:   . Fear of Current or Ex-Partner:   . Emotionally Abused:   Marland Kitchen Physically Abused:   . Sexually Abused:     Current Outpatient Medications on File Prior to Visit  Medication Sig Dispense Refill  . allopurinol (ZYLOPRIM) 100 MG tablet TAKE 2 TABLETS BY MOUTH ONCE A DAY 180 tablet 3  . ALPRAZolam (XANAX) 0.25 MG tablet Take 1 tablet (0.25 mg total) by mouth 2 (two) times daily as needed for anxiety. 25 tablet 0  . amLODipine (NORVASC) 5 MG tablet Take 1 tablet (5 mg total) by mouth daily. 180 tablet 3  . Ascorbic Acid (VITAMIN C PO) Take by mouth.    . benzonatate (TESSALON PERLES) 100 MG capsule Take 1 capsule (100 mg total) by mouth 3 (three) times daily as needed for cough. 30 capsule 1  . Cholecalciferol (VITAMIN D3 PO) Take by mouth.    . colchicine 0.6 MG tablet Take 1 tablet (0.6 mg total) by mouth daily.    . diclofenac Sodium (VOLTAREN) 1 % GEL Apply 2 g topically 4 (four) times daily as needed. 150 g 11  . doxycycline (VIBRA-TABS) 100 MG tablet Take 1 tablet (100 mg total) by mouth 2 (two) times daily. 14 tablet 0  . DULoxetine (CYMBALTA) 30 MG capsule Take 1 capsule (30 mg total) by mouth daily. 90 capsule 3  . ELIQUIS 5 MG TABS tablet TAKE 1 TABLET BY MOUTH TWICE A DAY 60 tablet 10  . furosemide (LASIX) 40 MG tablet Take 1 tablet (40 mg total) by mouth daily. 90 tablet 3  . gabapentin (NEURONTIN) 300 MG capsule Take 1 capsule (300 mg total) by mouth 2 (two) times daily. 180 capsule 3  . HYDROcodone-acetaminophen (NORCO/VICODIN) 5-325 MG tablet Take 1 tablet by mouth 2 (two) times daily as needed for moderate pain. 20 tablet 0  . lovastatin (MEVACOR) 20 MG tablet Take 1 tablet (20 mg total) by mouth daily at 6 PM. 90 tablet 3  . Magnesium 250 MG TABS Take 250 mg by mouth daily.    . metoprolol succinate (TOPROL-XL) 100 MG 24 hr tablet TAKE 1 TABLET BY MOUTH DAILY WITH OR  IMMEDIATLY FOLLOWING A MEAL 90 tablet 3  . Multiple Vitamins-Minerals (MULTIVITAMIN WOMEN 50+ PO) Take by mouth.    . potassium chloride SA (KLOR-CON) 10 MEQ tablet Take 1 tablet (10 mEq total) by mouth daily. 90 tablet 3  . spironolactone (ALDACTONE) 25 MG tablet Take 0.5 tablets (12.5 mg total) by mouth daily. 45 tablet 3   No current facility-administered medications on file prior to visit.    Allergies  Allergen Reactions  . Buspirone Hcl Other (See Comments)    REACTION: made her sleepy, ? swollen legs  . Lisinopril Other (See Comments)    REACTION: dizziness  . Prozac [Fluoxetine Hcl] Other (See Comments)    sleepy    Review of Systems Constitutional: negative for chills, fatigue, fevers and sweats Eyes: negative for irritation, redness and visual disturbance Ears, nose, mouth, throat, and face: negative for hearing loss, nasal congestion, snoring and tinnitus Respiratory: negative for asthma, cough, sputum Cardiovascular: negative for chest pain, dyspnea, exertional chest pressure/discomfort, irregular heart beat,  palpitations and syncope Gastrointestinal: negative for abdominal pain, change in bowel habits, nausea and vomiting Genitourinary: negative for abnormal menstrual periods, genital lesions, sexual problems and vaginal discharge, dysuria and urinary incontinence.  Positive for pelvic pain and vaginal pressure/bulge.  Integument/breast: negative for breast lump, breast tenderness and nipple discharge Hematologic/lymphatic: negative for bleeding and easy bruising Musculoskeletal:negative for back pain and muscle weakness Neurological: negative for dizziness, headaches, vertigo and weakness Endocrine: negative for diabetic symptoms including polydipsia, polyuria and skin dryness Allergic/Immunologic: negative for hay fever and urticaria       Objective:     BP (!) 146/74   Pulse 84   Ht 5\' 7"  (1.702 m)   Wt 247 lb 9.6 oz (112.3 kg)   BMI 38.78 kg/m   General  appearance: alert and no distress, moderate obesity Abdomen: soft, non-tender; bowel sounds normal; no masses,  no organomegaly Pelvic: external genitalia normal, rectovaginal septum normal.  Vagina without discharge. Vestibular papillae present on bilateral sidewalls. Grade 2 cystocele and Grade 1-2 rectocele present.  Uterus and cervix surgically absent.  Adnexae non-palpable (partially due to body habitus as well), nontender bilaterally.  Extremities: extremities normal, atraumatic, no cyanosis or edema Neurologic: Grossly normal   Assessment:   The patient has a cystocele and rectocele  Pelvic pain  Plan:    Discussed cystoceles/rectoceles and management options with the patient. Pelvic ultrasound ordered to assess patient's pain. May be secondary to pelvic organ prolapse but would like to rule out other causes   RTC in 1-2 weeks for ultrasound and f/u with MD.  Can further discuss management of pelvic organ prolapse if no abnormal findings on ultrasound.     Rubie Maid, MD [facility]

## 2019-07-24 ENCOUNTER — Ambulatory Visit (INDEPENDENT_AMBULATORY_CARE_PROVIDER_SITE_OTHER): Payer: Medicare HMO | Admitting: Obstetrics and Gynecology

## 2019-07-24 ENCOUNTER — Encounter: Payer: Self-pay | Admitting: Obstetrics and Gynecology

## 2019-07-24 ENCOUNTER — Other Ambulatory Visit: Payer: Self-pay

## 2019-07-24 ENCOUNTER — Ambulatory Visit (INDEPENDENT_AMBULATORY_CARE_PROVIDER_SITE_OTHER): Payer: Medicare HMO

## 2019-07-24 VITALS — BP 101/68 | HR 67 | Ht 67.0 in | Wt 249.5 lb

## 2019-07-24 DIAGNOSIS — R102 Pelvic and perineal pain: Secondary | ICD-10-CM | POA: Diagnosis not present

## 2019-07-24 NOTE — Progress Notes (Signed)
Pt present for ultrasound and follow up test results. Pt stated that she was doing well no problems.

## 2019-07-24 NOTE — Patient Instructions (Signed)
Pelvic Pain, Female Pelvic pain is pain in your lower belly (abdomen), below your belly button and between your hips. The pain may start suddenly (be acute), keep coming back (be recurring), or last a long time (become chronic). Pelvic pain that lasts longer than 6 months is called chronic pelvic pain. There are many causes of pelvic pain. Sometimes the cause of pelvic pain is not known. Follow these instructions at home:   Take over-the-counter and prescription medicines only as told by your doctor.  Rest as told by your doctor.  Do not have sex if it hurts.  Keep a journal of your pelvic pain. Write down: ? When the pain started. ? Where the pain is located. ? What seems to make the pain better or worse, such as food or your period (menstrual cycle). ? Any symptoms you have along with the pain.  Keep all follow-up visits as told by your doctor. This is important. Contact a doctor if:  Medicine does not help your pain.  Your pain comes back.  You have new symptoms.  You have unusual discharge or bleeding from your vagina.  You have a fever or chills.  You are having trouble pooping (constipation).  You have blood in your pee (urine) or poop (stool).  Your pee smells bad.  You feel weak or light-headed. Get help right away if:  You have sudden pain that is very bad.  Your pain keeps getting worse.  You have very bad pain and also have any of these symptoms: ? A fever. ? Feeling sick to your stomach (nausea). ? Throwing up (vomiting). ? Being very sweaty.  You pass out (lose consciousness). Summary  Pelvic pain is pain in your lower belly (abdomen), below your belly button and between your hips.  There are many possible causes of pelvic pain.  Keep a journal of your pelvic pain. This information is not intended to replace advice given to you by your health care provider. Make sure you discuss any questions you have with your health care provider. Document  Revised: 07/26/2017 Document Reviewed: 07/26/2017 Elsevier Patient Education  2020 [employer]  

## 2019-07-24 NOTE — Progress Notes (Signed)
    GYNECOLOGY PROGRESS NOTE  Subjective:    Patient ID: Monica [name], female    DOB: [birth year], 84 y.o.   MRN: [id number]  HPI  Patient is a 84 y.o. G54P1011 female who presents for ultrasound results.  Had previous complaints of pelvic pain. Has a prior history of hysterectomy, but notes that she still believes she has her ovaries. Overall doing well today.   Patient also has questions about managing the sweat under her pannus.   The following portions of the patient's history were reviewed and updated as appropriate: allergies, current medications, past family history, past medical history, past social history, past surgical history and problem list.  Review of Systems Pertinent items noted in HPI and remainder of comprehensive ROS otherwise negative.   Objective:   Blood pressure 101/68, pulse 67, height 5\' 7"  (1.702 m), weight 249 lb 8 oz (113.2 kg). General appearance: alert and no distress Remainder of exam deferred.       Imaging:  Patient Name: Monica [name] DOB: [birth year] MRN: [id number] ULTRASOUND REPORT  Location: [facility]  Date of Service: 07/24/2019     Indications:Pelvic Pain Findings:    S/P Hysterectomy  Right Ovary not seen  Left Ovary not seen.  Survey of the adnexa demonstrates no adnexal masses. There is no free fluid in the cul de sac.  Impression: 1. Survey of the adnexa demonstrates no adnexal masses. 2. Ovaries were not seen at this time.  Recommendations: 1.Clinical correlation with the patient's History and Physical Exam.   Jenine M. Albertine Grates    RDMS  Assessment:   Pelvic pain  Plan:   - Discussion had with patient regarding ultrasound results.  No abnormalities noted.  Patient still adamant that she did not have her ovaries removed, despite no ultrasonographic evidence today. Is possible that they are severely atrophied, however reassured that this was not likely the cause of her pain (nor is she at  increased risk of ovarian cancer, which was her concern).  - Discussed concerns about increased moisture under pannus, advised on home treatment measures.   Follow up as needed.    Rubie Maid, MD [facility]

## 2019-09-05 ENCOUNTER — Other Ambulatory Visit: Payer: Self-pay

## 2019-09-05 ENCOUNTER — Other Ambulatory Visit (INDEPENDENT_AMBULATORY_CARE_PROVIDER_SITE_OTHER): Payer: Medicare HMO

## 2019-09-05 DIAGNOSIS — I10 Essential (primary) hypertension: Secondary | ICD-10-CM

## 2019-09-05 DIAGNOSIS — E559 Vitamin D deficiency, unspecified: Secondary | ICD-10-CM

## 2019-09-05 DIAGNOSIS — Z1329 Encounter for screening for other suspected endocrine disorder: Secondary | ICD-10-CM | POA: Diagnosis not present

## 2019-09-05 LAB — LIPID PANEL
Cholesterol: 183 mg/dL (ref 0–200)
HDL: 41.1 mg/dL (ref >39.00)
LDL Cholesterol: 122 mg/dL — ABNORMAL HIGH (ref 0–99)
NonHDL: 141.67
Total CHOL/HDL Ratio: 4
Triglycerides: 99 mg/dL (ref 0.0–149.0)
VLDL: 19.8 mg/dL (ref 0.0–40.0)

## 2019-09-05 LAB — CBC WITH DIFFERENTIAL/PLATELET
Basophils Absolute: 0 10*3/uL (ref 0.0–0.1)
Basophils Relative: 0.5 % (ref 0.0–3.0)
Eosinophils Absolute: 0.1 10*3/uL (ref 0.0–0.7)
Eosinophils Relative: 1.8 % (ref 0.0–5.0)
HCT: 39.7 % (ref 36.0–46.0)
Hemoglobin: 12.7 g/dL (ref 12.0–15.0)
Lymphocytes Relative: 25.1 % (ref 12.0–46.0)
Lymphs Abs: 1.7 10*3/uL (ref 0.7–4.0)
MCHC: 31.9 g/dL (ref 30.0–36.0)
MCV: 87.6 fl (ref 78.0–100.0)
Monocytes Absolute: 0.5 10*3/uL (ref 0.1–1.0)
Monocytes Relative: 7 % (ref 3.0–12.0)
Neutro Abs: 4.3 10*3/uL (ref 1.4–7.7)
Neutrophils Relative %: 65.6 % (ref 43.0–77.0)
Platelets: 221 10*3/uL (ref 150.0–400.0)
RBC: 4.53 Mil/uL (ref 3.87–5.11)
RDW: 16.4 % — ABNORMAL HIGH (ref 11.5–15.5)
WBC: 6.6 10*3/uL (ref 4.0–10.5)

## 2019-09-05 LAB — COMPREHENSIVE METABOLIC PANEL
ALT: 8 U/L (ref 0–35)
AST: 13 U/L (ref 0–37)
Albumin: 4.1 g/dL (ref 3.5–5.2)
Alkaline Phosphatase: 100 U/L (ref 39–117)
BUN: 20 mg/dL (ref 6–23)
CO2: 27 mEq/L (ref 19–32)
Calcium: 9.5 mg/dL (ref 8.4–10.5)
Chloride: 105 mEq/L (ref 96–112)
Creatinine, Ser: 1.01 mg/dL (ref 0.40–1.20)
GFR: 63.49 mL/min (ref >60.00)
Glucose, Bld: 101 mg/dL — ABNORMAL HIGH (ref 70–99)
Potassium: 3.9 mEq/L (ref 3.5–5.1)
Sodium: 141 mEq/L (ref 135–145)
Total Bilirubin: 0.6 mg/dL (ref 0.2–1.2)
Total Protein: 6.8 g/dL (ref 6.0–8.3)

## 2019-09-05 LAB — TSH: TSH: 2.21 u[IU]/mL (ref 0.35–4.50)

## 2019-09-05 LAB — VITAMIN D 25 HYDROXY (VIT D DEFICIENCY, FRACTURES): VITD: 29.92 ng/mL — ABNORMAL LOW (ref 30.00–100.00)

## 2019-09-10 ENCOUNTER — Other Ambulatory Visit: Payer: Self-pay | Admitting: Internal Medicine

## 2019-09-10 DIAGNOSIS — E785 Hyperlipidemia, unspecified: Secondary | ICD-10-CM

## 2019-09-10 MED ORDER — ATORVASTATIN CALCIUM 20 MG PO TABS: 20.0000 mg | ORAL_TABLET | Freq: Every day | ORAL | 3 refills | Status: DC

## 2019-09-23 DIAGNOSIS — H04123 Dry eye syndrome of bilateral lacrimal glands: Secondary | ICD-10-CM | POA: Diagnosis not present

## 2019-09-23 DIAGNOSIS — H0102B Squamous blepharitis left eye, upper and lower eyelids: Secondary | ICD-10-CM | POA: Diagnosis not present

## 2019-09-23 DIAGNOSIS — H0102A Squamous blepharitis right eye, upper and lower eyelids: Secondary | ICD-10-CM | POA: Diagnosis not present

## 2019-09-23 DIAGNOSIS — Z961 Presence of intraocular lens: Secondary | ICD-10-CM | POA: Diagnosis not present

## 2019-09-23 DIAGNOSIS — H26492 Other secondary cataract, left eye: Secondary | ICD-10-CM | POA: Diagnosis not present

## 2019-11-07 ENCOUNTER — Ambulatory Visit: Payer: Medicare HMO | Admitting: Internal Medicine

## 2019-12-18 ENCOUNTER — Other Ambulatory Visit: Payer: Self-pay | Admitting: Interventional Cardiology

## 2019-12-21 ENCOUNTER — Other Ambulatory Visit: Payer: Self-pay | Admitting: Interventional Cardiology

## 2019-12-25 DIAGNOSIS — Z23 Encounter for immunization: Secondary | ICD-10-CM | POA: Diagnosis not present

## 2019-12-27 ENCOUNTER — Other Ambulatory Visit: Payer: Self-pay | Admitting: Interventional Cardiology

## 2019-12-27 NOTE — Telephone Encounter (Signed)
Prescription refill request for Eliquis received.  Last office visit: Monica [name] 02/13/2019 Scr: 1.01, 09/05/2019 Age: 84 yo Weight: 113.2 kg   Prescription refill sent.

## 2019-12-30 ENCOUNTER — Ambulatory Visit: Payer: Medicare HMO | Admitting: Adult Health

## 2019-12-30 ENCOUNTER — Encounter: Payer: Self-pay | Admitting: Adult Health

## 2019-12-30 VITALS — BP 138/88 | HR 70 | Ht 67.0 in | Wt 252.0 lb

## 2019-12-30 DIAGNOSIS — I63412 Cerebral infarction due to embolism of left middle cerebral artery: Secondary | ICD-10-CM

## 2019-12-30 DIAGNOSIS — I4892 Unspecified atrial flutter: Secondary | ICD-10-CM | POA: Diagnosis not present

## 2019-12-30 DIAGNOSIS — M5416 Radiculopathy, lumbar region: Secondary | ICD-10-CM | POA: Diagnosis not present

## 2019-12-30 DIAGNOSIS — I10 Essential (primary) hypertension: Secondary | ICD-10-CM

## 2019-12-30 DIAGNOSIS — G8929 Other chronic pain: Secondary | ICD-10-CM | POA: Diagnosis not present

## 2019-12-30 DIAGNOSIS — M542 Cervicalgia: Secondary | ICD-10-CM | POA: Diagnosis not present

## 2019-12-30 DIAGNOSIS — E785 Hyperlipidemia, unspecified: Secondary | ICD-10-CM | POA: Diagnosis not present

## 2019-12-30 MED ORDER — GABAPENTIN 300 MG PO CAPS: 300.0000 mg | ORAL_CAPSULE | Freq: Three times a day (TID) | ORAL | 3 refills | Status: DC | PRN

## 2019-12-30 NOTE — Progress Notes (Signed)
[facility] [address] [place]. [place] [address] [phone]       STROKE FOLLOW UP NOTE  [name] Date of Birth:  08/19/1935 Medical Record Number:  [id number]   Reason for Referral: Left MCA stroke 08/2018    CHIEF COMPLAINT:  Chief Complaint  Patient presents with  . Follow-up    rm 9 with daughter; 2-3 mo onset b/l hand intermittent numbness, b/l feet heaviness/tightness sensation and continued lower back pain     HPI:    Today, 12/30/2019, Monica [name] returns for stroke follow up accompianed by her daughter.  She has been stable from a stroke standpoint without new or recurring stroke/TIA symptoms.  Remains on Eliquis and atorvastatin for secondary stroke prevention without side effects.  Blood pressure today 138/88 routinely monitors at home which has been stable. Multiple other none stroke complaints including intermittent bilateral hand numbness and heaviness sensation on the bottom of her feet bilaterally at night.  Reports symptoms present over the past few months.  Hand numbness can be in both right or left hand generalized without specific affected fingers or areas and can last for 5 to 10 minutes.  Symptoms usually present upon awakening but also at times while sitting in a chair or increased activity.  Denies any associated weakness.  Does report ongoing bilateral neck and shoulder pain with increased tightness of upper trapezius especially when looking to the right or the left.  Denies numbness in the bottom of her feet but more of a heaviness/tightness sensation and has been using lotion/balm for very dry, flaky skin.  She continues to experience lower back pain which worsens after standing for too long.  This was previously discussed and had evaluation by neurosurgery who recommended surgical procedure but she was not interested in surgical intervention.  This pain previously managed well on gabapentin 300 mg 3 times daily but currently on  300 mg twice daily.  No further concerns at this time.     History provided for reference purposes only Update 06/26/2019 JM: Monica [name] returns for stroke follow-up accompianed by her daughter.  She has been stable since prior visit without residual deficits and denies new or recurrent stroke/TIA symptoms.  Continues on Eliquis with history of atrial fibrillation and secondary stroke prevention without side effects. Blood pressure today 152/82.  No neurological concerns at this time.  Update 12/26/2018: Monica [name] is an 84 year old female who is being seen today for stroke follow-up.  She has been stable from a stroke standpoint without new or reoccurring stroke/TIA symptoms.  Continues on Eliquis without bleeding or bruising.  Ongoing follow-up with cardiology regularly.  At prior visit, patient's main concern was low back pain with radiculopathy limiting functional ability.  Initiated gabapentin at prior visit and recommended follow-up with established orthopedic provider.  Continue to experience pain despite gabapentin.  Due to ongoing pain, she requested referral to neurosurgery for further evaluation and potential treatment options.  She states her only option was back surgery which she is not interested at this time due to recent stroke and anticoagulation.  She continues to take gabapentin and does endorse improvement and benefit with ongoing use.  Denies side effects.  Due to decreased pain with gabapentin, she has slowly increased activity and daily functioning.  Continues to use a cane for ambulation and denies any recent falls.  Continues to live independently but daughter will check on her frequently along with other family members.  She endorses improvement of her overall  mood.  Cardiology started her on Xanax 0.25 mg daily as needed for anxiety but only had to take 1 time and has not had any recurrent anxiety symptoms.  Recently initiated care with new PCP Dr. Terese Door.  She is  questioning potential return to driving.  No further concerns at this time.  Initial visit 10/25/2018: Monica [name] is being seen today for hospital follow-up and is accompanied by her daughter.  She has recovered well from a stroke standpoint without residual deficits.  She has since completed home health therapies.  She continues on Eliquis without bleeding or bruising for atrial flutter and secondary stroke prevention along with treatment of bilateral DVT.  She continues on amiodarone taper and plans on doing 30-day cardiac event monitor after 11/2 (amioderone will be completed at that time).  She was initially staying with her daughter at hospital discharge but has since returned home.  She has been having difficulty with bilateral lower back pain with radiculopathy R>L leg pain.  Pain consists of stabbing sensation which is worsened with ambulation and progressively worsening.  She previously was being seen by Ortho with injections.  She had trialed gabapentin in the past which she tolerated well but would only use intermittently.  Prior MRI lumbar spine and 08/2017 showed multiple levels of spinal stenosis and degenerative changes.  Initially upon hospital discharge, she continued to have right-sided weakness therefore relying heavily on left side for ambulation.  She has not contacted her establish orthopedic provider, Dr. Tamala Julian, in regards to her worsening back pain.  She has also been experiencing increased anxiety and depression since returning home which she believes is more due to her constant pain which has been limiting mobility and functioning.  No further concerns at this time.  Denies new or worsening stroke/TIA symptoms.  Stroke admission 09/01/2018: MonicaJaziah Richmondis a 84 y.o.femalewith history of hypertension, hyperlipidemia, obesity, remote GI bleeding who presented to [facility] ED on 09/01/2018 with sudden onset aphasia and right hemiparesis.  Neurology consulted with stroke work-up revealing  left MCA punctate small infarct due to left M1 occlusion status post TPA and IR with TICI 3 reperfusion embolic pattern in setting of bilateral DVT and new onset A. Fib.  She received TPA without complication.  MRI showed left MCA infarcts.  CTA head/neck showed occlusion of distal M1 with intermediate collateralization, bilateral ICA bulb arthrosclerosis and right VA origin stenosis.  2D echo normal EF.  Lower extremity venous Doppler showed right PTV, left popliteal and bilateral peroneal vein acute DVT.  Eliquis initiated for treatment of DVT and new onset atrial fibrillation.  No evidence of HLD with LDL 66 or DM with A1c 6.2.  HTN stable.  Due to atrial fibrillation/atrial flutter with RVR, amiodarone drip initiated with conversion to NSR and recommended continuation of amiodarone with tapering schedule for 6 to 12 weeks and follow-up with cardiology outpatient.  Other stroke risk factors include advanced age, former tobacco use, EtOH use, obesity, family history of stroke and OSA.  Discharged home in stable condition recommendation home health therapies.    ROS:   14 system review of systems performed and negative with exception of those listed in HPI  PMH:  Past Medical History:  Diagnosis Date  . Allergic rhinitis   . Anxiety   . Benign neoplasm of breast 2013  . Cardiac arrhythmia   . Cardiomegaly   . Cataract   . CHF (congestive heart failure) ([place])   . Compulsive eating patterns   . Compulsive overeating   .  Depression   . Diastolic heart failure (H[facility])    LVH with free wall thickness 1.4 cm EF 60% Dr. Tamala Julian   . Diffuse cystic mastopathy 2013  . Edema   . Epistaxis    noted 12/20/18   . GI bleeding    in ~2018   . Gout   . Hiatal hernia   . HTN (hypertension)   . Hyperlipidemia   . Lump or mass in breast 2012  . OA (osteoarthritis) of knee    with injections  . Obesity   . OSA (obstructive sleep apnea)    not on cpap  . Other abnormal glucose   . Peptic stricture of  esophagus   . Personal history of tobacco use, presenting hazards to health   . Sciatica   . Sinus infection 2010  . Spinal stenosis    lumbar   . Stroke ([place])   . Unspecified sleep apnea     PSH:  Past Surgical History:  Procedure Laterality Date  . adenosine cardiolite  1/08   low risk   . APPENDECTOMY    . BREAST BIOPSY     02/28/11 negative  . BREAST LUMPECTOMY     right x1 ('89) left x2 ('70s, '90)  . CARDIAC CATHETERIZATION  2001   normal per pt  . COLONOSCOPY  11/02   diverticulosis  . COLONOSCOPY  9/06   diverticulosis, hemorhoids  . COLONOSCOPY N/A 10/01/2016   Procedure: COLONOSCOPY;  Surgeon: Gatha Mayer, MD;  Location: [facility];  Service: Endoscopy;  Laterality: N/A;  . dexa  3/02   normal  . EYE SURGERY  2012   cataract  . IR CT HEAD LTD  09/01/2018  . IR PERCUTANEOUS ART THROMBECTOMY/INFUSION INTRACRANIAL INC DIAG ANGIO  09/01/2018  . JOINT REPLACEMENT     knee b/l 2011  . knee replacement Right 9/11   R. Dr. Telford Nab  . RADIOLOGY WITH ANESTHESIA N/A 09/01/2018   Procedure: IR WITH ANESTHESIA;  Surgeon: Luanne Bras, MD;  Location: [place];  Service: Radiology;  Laterality: N/A;  . sleep study  5/05  . TONSILLECTOMY    . TOTAL ABDOMINAL HYSTERECTOMY     fibroids, age of 42  . TOTAL KNEE ARTHROPLASTY Left 2012  . US TRANSVAGINAL PELVIC MODIFIED  2000, 2001    Social History:  Social History   Socioeconomic History  . Marital status: Widowed    Spouse name: Not on file  . Number of children: 1  . Years of education: Not on file  . Highest education level: Not on file  Occupational History  . Occupation: Retired Ship broker: RETIRED  Tobacco Use  . Smoking status: Former Smoker    Packs/day: 0.50    Years: 20.00    Pack years: 10.00    Types: Cigarettes  . Smokeless tobacco: Never Used  . Tobacco comment: quit approx. 20 years ago  Vaping Use  . Vaping Use: Never used  Substance and Sexual Activity  . Alcohol use:  Yes    Alcohol/week: 0.0 standard drinks    Comment: wine occasional  . Drug use: No  . Sexual activity: Never  Other Topics Concern  . Not on file  Social History Narrative   Widowed x 30+ years as of 12/2018    1 daughter. 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  Difficulty of Paying Living Expenses: Not on file  Food Insecurity:   . Worried About Charity fundraiser in the Last Year: Not on file  . Ran Out of Food in the Last Year: Not on file  Transportation Needs:   . Lack of Transportation (Medical): Not on file  . Lack of Transportation (Non-Medical): Not on file  Physical Activity:   . Days of Exercise per Week: Not on file  . Minutes of Exercise per Session: Not on file  Stress:   . Feeling of Stress : Not on file  Social Connections:   . Frequency of Communication with Friends and Family: Not on file  . Frequency of Social Gatherings with Friends and Family: Not on file  . Attends Religious Services: Not on file  . Active Member of Clubs or Organizations: Not on file  . Attends Archivist Meetings: Not on file  . Marital Status: Not on file  Intimate Partner Violence:   . Fear of Current or Ex-Partner: Not on file  . Emotionally Abused: Not on file  . Physically Abused: Not on file  . Sexually Abused: Not on file    Family History:  Family History  Problem Relation Age of Onset  . Stroke Father   . Hypertension Mother        (a lot of animosity in their relationship)  . Heart failure Mother   . Gout Other        whole family   . Prostate cancer Brother   . Cancer Brother        prostate   . Hepatitis Brother        Hep C; alcoholism (terminal)  . Drug abuse Brother        died 72  . Breast cancer Sister   . Cancer Sister        breast cancer  . Other Brother        drug addiction    Medications:   Current  Outpatient Medications on File Prior to Visit  Medication Sig Dispense Refill  . allopurinol (ZYLOPRIM) 100 MG tablet TAKE 2 TABLETS BY MOUTH ONCE A DAY 180 tablet 3  . ALPRAZolam (XANAX) 0.25 MG tablet Take 1 tablet (0.25 mg total) by mouth 2 (two) times daily as needed for anxiety. 25 tablet 0  . Ascorbic Acid (VITAMIN C PO) Take by mouth.    Marland Kitchen atorvastatin (LIPITOR) 20 MG tablet Take 1 tablet (20 mg total) by mouth daily. At night. STOP/discontinue Lovastatin 20 90 tablet 3  . Cholecalciferol (VITAMIN D3 PO) Take by mouth.    . colchicine 0.6 MG tablet Take 1 tablet (0.6 mg total) by mouth daily.    . diclofenac Sodium (VOLTAREN) 1 % GEL Apply 2 g topically 4 (four) times daily as needed. 150 g 11  . DULoxetine (CYMBALTA) 30 MG capsule Take 1 capsule (30 mg total) by mouth daily. 90 capsule 3  . ELIQUIS 5 MG TABS tablet TAKE 1 TABLET BY MOUTH TWICE A DAY 60 tablet 5  . furosemide (LASIX) 40 MG tablet Take 1 tablet (40 mg total) by mouth daily. 90 tablet 3  . HYDROcodone-acetaminophen (NORCO/VICODIN) 5-325 MG tablet Take 1 tablet by mouth 2 (two) times daily as needed for moderate pain. 20 tablet 0  . KLOR-CON M20 20 MEQ tablet TAKE 1 TABLET BY MOUTH EVERY DAY 30 tablet 0  . Magnesium 250 MG TABS Take 250 mg by mouth daily.    . metoprolol succinate (  TOPROL-XL) 100 MG 24 hr tablet TAKE 1 TABLET BY MOUTH DAILY WITH OR IMMEDIATLY FOLLOWING A MEAL 90 tablet 3  . Multiple Vitamins-Minerals (MULTIVITAMIN WOMEN 50+ PO) Take by mouth.    . potassium chloride SA (KLOR-CON) 10 MEQ tablet Take 1 tablet (10 mEq total) by mouth daily. 90 tablet 3  . amLODipine (NORVASC) 5 MG tablet Take 1 tablet (5 mg total) by mouth daily. 180 tablet 3  . spironolactone (ALDACTONE) 25 MG tablet Take 0.5 tablets (12.5 mg total) by mouth daily. Please make yearly appt with Dr. Tamala Julian for December for future refills. Thank you 1st attempt 45 tablet 0   No current facility-administered medications on file prior to visit.     Allergies:   Allergies  Allergen Reactions  . Buspirone Hcl Other (See Comments)    REACTION: made her sleepy, ? swollen legs  . Lisinopril Other (See Comments)    REACTION: dizziness  . Prozac [Fluoxetine Hcl] Other (See Comments)    sleepy     Physical Exam  Vitals:   12/30/19 1408  BP: 138/88  Pulse: 70  Weight: 252 lb (114.3 kg)  Height: 5\' 7"  (1.702 m)   Body mass index is 39.47 kg/m. No exam data present  General: Obese pleasant elderly female, seated, in no evident distress Head: head normocephalic and atraumatic.   Neck: supple with no carotid or supraclavicular bruits Cardiovascular: regular rate and rhythm, no murmurs Musculoskeletal: Muscular tension b/l upper trapezius; decreased ROM b/l shoulders; full ROM neck with reported increased tightness looking to the left and the right Skin:  no rash/petichiae Vascular:  Normal pulses all extremities   Neurologic Exam Mental Status: Awake and fully alert. fluent speech and language. Oriented to place and time. Recent and remote memory intact. Attention span, concentration and fund of knowledge appropriate. Mood and affect appropriate.  Cranial Nerves:  Pupils equal, briskly reactive to light. Extraocular movements full without nystagmus. Visual fields full to confrontation. Hearing intact. Facial sensation intact. Face, tongue, palate moves normally and symmetrically.  Motor: Normal bulk and tone.  Normal weakness throughout all tested extremities Sensory.:  Decreased vibratory sensory RLE distal otherwise intact Coordination: Rapid alternating movements normal in all extremities. Finger-to-nose performed accurately bilaterally.   Gait and Station: Arises from chair without difficulty. Stance is slightly hunched.  Gait abnormality with lateral pelvic tilt and mild unsteadiness with use of cane Reflexes: 1+ and symmetric. Toes downgoing.        ASSESSMENT/PLAN: Monica [name] is a 84 y.o. year old female  presented to [facility] ED with sudden onset aphasia and right hemiparesis on 09/01/2018 with stroke work-up revealing left MCA punctate small infarct due to left M1 occlusion status post TPA and IR with TICI 3 reperfusion embolic pattern secondary to bilateral DVT and new onset atrial flutter. Vascular risk factors include new onset atrial flutter, bilateral DVT, HTN, chronic diastolic HF, advanced age, former tobacco use, EtOH use, obesity and OSA.      1. Left MCA infarcts: Continue Eliquis (apixaban) daily and atorvastatin for secondary stroke prevention. Maintain strict control of hypertension with blood pressure goal below 130/90, diabetes with hemoglobin A1c goal below 6.5% and cholesterol with LDL cholesterol (bad cholesterol) goal below 70 mg/dL.  I also advised the patient to eat a healthy diet with plenty of whole grains, cereals, fruits and vegetables, exercise regularly with at least 30 minutes of continuous activity daily and maintain ideal body weight. 2. B/l hand numbness: ddx cervical radiculopathy vs b/l carpal  tunnel syndrome.  Unable to appreciate focal deficits such as weakness or sensory changes.  Referral placed to PT for dry needling with upper trapezius tension possibly contributing to symptoms.  Recommended increasing gabapentin from 300 mg twice daily to 300 mg 3 times daily. If symptoms continue despite adequate trial therapy, further evaluation may be indicated such as EMG/NCV or cervical MRI 3. B/l foot symptoms: Complains of bilateral foot heaviness/tightness sensation usually at nighttime.  Per exam, decreased vibratory sensation right foot. Hx of lumbar radiculopathy previously recommending surgical procedure by neurosurgery and discuss potential progression of this.  She does report change in her gait with evidence of lateral pelvic tilt and questions benefit of therapy.  Recommend increasing gabapentin to 300 mg 3 times daily as this dosage previously provided benefit.  Referral  placed to PT for possible benefit.  Advised to follow-up with PCP in regards to "very dry, cracked skin" on feet bilaterally 4. HTN: Stable.  Continue to follow with PCP for monitoring and management 5. HLD: Recently started on atorvastatin per PCP for elevated LDL.  She has follow-up with PCP 11/17 and plans on repeating lab work at that time.  Advised ongoing use of atorvastatin for secondary stroke prevention monitored and managed by PCP 6. Atrial flutter: Continue Eliquis along with ongoing follow-up with cardiology 7. Acute bilateral DVT: Repeat lower extremity ultrasound showed resolution   Follow up in 6 months or call earlier if needed  CC:  [facility] provider: Dr. Leonie Man McLean-Scocuzza, Nino Glow, MD     I spent 40 minutes of face-to-face and non-face-to-face time with patient and daughter.  This included previsit chart review, lab review, study review, order entry, electronic health record documentation, patient education and discussion regarding history of prior stroke, bilateral hand numbness and bilateral foot symptoms, possible etiologies and further treatment options, potential need of further evaluation in the future, importance of managing stroke risk factors and answered all other questions to patient and daughter satisfaction  Frann Rider, AGNP-BC  [facility] [address] [place], [place] [address]  Phone [phone] Fax [phone] Note: This document was prepared with digital dictation and possible smart phrase technology. Any transcriptional errors that result from this process are unintentional.

## 2019-12-30 NOTE — Patient Instructions (Addendum)
Recommend dry needling with PT  - will look into places closer to your home  Recommend returning to gabapentin 3 times daily as needed  Continue Eliquis (apixaban) daily  and atorvastatin  for secondary stroke prevention  Continue to follow up with PCP regarding cholesterol and blood pressure management  Maintain strict control of hypertension with blood pressure goal below 130/90 and cholesterol with LDL cholesterol (bad cholesterol) goal below 70 mg/dL.      Followup in the future with me in 4 months or call earlier if needed      Thank you for coming to see [place] at [facility]. I hope we have been able to provide you high quality care today.  You may receive a patient satisfaction survey over the next few weeks. We would appreciate your feedback and comments so that we may continue to improve ourselves and the health of our patients.

## 2019-12-31 ENCOUNTER — Other Ambulatory Visit: Payer: Self-pay | Admitting: Interventional Cardiology

## 2019-12-31 NOTE — Progress Notes (Signed)
I agree with the above plan 

## 2020-01-02 ENCOUNTER — Other Ambulatory Visit: Payer: Self-pay

## 2020-01-02 DIAGNOSIS — M5416 Radiculopathy, lumbar region: Secondary | ICD-10-CM

## 2020-01-02 MED ORDER — GABAPENTIN 300 MG PO CAPS: 300.0000 mg | ORAL_CAPSULE | Freq: Three times a day (TID) | ORAL | 3 refills | Status: DC | PRN

## 2020-01-08 ENCOUNTER — Ambulatory Visit (INDEPENDENT_AMBULATORY_CARE_PROVIDER_SITE_OTHER): Payer: Medicare HMO | Admitting: Internal Medicine

## 2020-01-08 ENCOUNTER — Other Ambulatory Visit: Payer: Self-pay

## 2020-01-08 ENCOUNTER — Encounter: Payer: Self-pay | Admitting: Internal Medicine

## 2020-01-08 VITALS — BP 140/88 | HR 77 | Temp 98.0°F | Ht 67.0 in | Wt 251.6 lb

## 2020-01-08 DIAGNOSIS — R296 Repeated falls: Secondary | ICD-10-CM

## 2020-01-08 DIAGNOSIS — Z1231 Encounter for screening mammogram for malignant neoplasm of breast: Secondary | ICD-10-CM

## 2020-01-08 DIAGNOSIS — M4800 Spinal stenosis, site unspecified: Secondary | ICD-10-CM | POA: Diagnosis not present

## 2020-01-08 DIAGNOSIS — R531 Weakness: Secondary | ICD-10-CM | POA: Diagnosis not present

## 2020-01-08 DIAGNOSIS — F4321 Adjustment disorder with depressed mood: Secondary | ICD-10-CM

## 2020-01-08 DIAGNOSIS — M25511 Pain in right shoulder: Secondary | ICD-10-CM

## 2020-01-08 DIAGNOSIS — M25512 Pain in left shoulder: Secondary | ICD-10-CM

## 2020-01-08 DIAGNOSIS — F419 Anxiety disorder, unspecified: Secondary | ICD-10-CM

## 2020-01-08 DIAGNOSIS — M109 Gout, unspecified: Secondary | ICD-10-CM

## 2020-01-08 DIAGNOSIS — R5381 Other malaise: Secondary | ICD-10-CM

## 2020-01-08 DIAGNOSIS — R269 Unspecified abnormalities of gait and mobility: Secondary | ICD-10-CM

## 2020-01-08 DIAGNOSIS — I10 Essential (primary) hypertension: Secondary | ICD-10-CM

## 2020-01-08 DIAGNOSIS — M48061 Spinal stenosis, lumbar region without neurogenic claudication: Secondary | ICD-10-CM

## 2020-01-08 DIAGNOSIS — M542 Cervicalgia: Secondary | ICD-10-CM

## 2020-01-08 DIAGNOSIS — E278 Other specified disorders of adrenal gland: Secondary | ICD-10-CM

## 2020-01-08 DIAGNOSIS — G8929 Other chronic pain: Secondary | ICD-10-CM

## 2020-01-08 DIAGNOSIS — E559 Vitamin D deficiency, unspecified: Secondary | ICD-10-CM

## 2020-01-08 MED ORDER — ALPRAZOLAM 0.25 MG PO TABS: 0.2500 mg | ORAL_TABLET | Freq: Two times a day (BID) | ORAL | 5 refills | Status: DC | PRN

## 2020-01-08 NOTE — Patient Instructions (Addendum)
Personal care services   Monica [name] made tumeric/ginger/curcumin or glucosamine/chondroitin/ co Q10   Kegel Exercises  Kegel exercises can help strengthen your pelvic floor muscles. The pelvic floor is a group of muscles that support your rectum, small intestine, and bladder. In females, pelvic floor muscles also help support the womb (uterus). These muscles help you control the flow of urine and stool. Kegel exercises are painless and simple, and they do not require any equipment. Your provider may suggest Kegel exercises to:  Improve bladder and bowel control.  Improve sexual response.  Improve weak pelvic floor muscles after surgery to remove the uterus (hysterectomy) or pregnancy (females).  Improve weak pelvic floor muscles after prostate gland removal or surgery (males). Kegel exercises involve squeezing your pelvic floor muscles, which are the same muscles you squeeze when you try to stop the flow of urine or keep from passing gas. The exercises can be done while sitting, standing, or lying down, but it is best to vary your position. Exercises How to do Kegel exercises: 1. Squeeze your pelvic floor muscles tight. You should feel a tight lift in your rectal area. If you are a female, you should also feel a tightness in your vaginal area. Keep your stomach, buttocks, and legs relaxed. 2. Hold the muscles tight for up to 10 seconds. 3. Breathe normally. 4. Relax your muscles. 5. Repeat as told by your health care provider. Repeat this exercise daily as told by your health care provider. Continue to do this exercise for at least 4-6 weeks, or for as long as told by your health care provider. You may be referred to a physical therapist who can help you learn more about how to do Kegel exercises. Depending on your condition, your health care provider may recommend:  Varying how long you squeeze your muscles.  Doing several sets of exercises every day.  Doing exercises for several  weeks.  Making Kegel exercises a part of your regular exercise routine. This information is not intended to replace advice given to you by your health care provider. Make sure you discuss any questions you have with your health care provider. Document Revised: 09/27/2017 Document Reviewed: 09/27/2017 Elsevier Patient Education  [place].  Overactive Bladder, Adult  Overactive bladder refers to a condition in which a person has a sudden need to pass urine. The person may leak urine if he or she cannot get to the bathroom fast enough (urinary incontinence). A person with this condition may also wake up several times in the night to go to the bathroom. Overactive bladder is associated with poor nerve signals between your bladder and your brain. Your bladder may get the signal to empty before it is full. You may also have very sensitive muscles that make your bladder squeeze too soon. These symptoms might interfere with daily work or social activities. What are the causes? This condition may be associated with or caused by:  Urinary tract infection.  Infection of nearby tissues, such as the prostate.  Prostate enlargement.  Surgery on the uterus or urethra.  Bladder stones, inflammation, or tumors.  Drinking too much caffeine or alcohol.  Certain medicines, especially medicines that get rid of extra fluid in the body (diuretics).  Muscle or nerve weakness, especially from: ? A spinal cord injury. ? Stroke. ? Multiple sclerosis. ? Parkinson's disease.  Diabetes.  Constipation. What increases the risk? You may be at greater risk for overactive bladder if you:  Are an older adult.  Smoke.  Are going through menopause.  Have prostate problems.  Have a neurological disease, such as stroke, dementia, Parkinson's disease, or multiple sclerosis (MS).  Eat or drink things that irritate the bladder. These include alcohol, spicy food, and caffeine.  Are overweight or  obese. What are the signs or symptoms? Symptoms of this condition include:  Sudden, strong urge to urinate.  Leaking urine.  Urinating 8 or more times a day.  Waking up to urinate 2 or more times a night. How is this diagnosed? Your health care provider may suspect overactive bladder based on your symptoms. He or she will diagnose this condition by:  A physical exam and medical history.  Blood or urine tests. You might need bladder or urine tests to help determine what is causing your overactive bladder. You might also need to see a health care provider who specializes in urinary tract problems (urologist). How is this treated? Treatment for overactive bladder depends on the cause of your condition and whether it is mild or severe. You can also make lifestyle changes at home. Options include:  Bladder training. This may include: ? Learning to control the urge to urinate by following a schedule that directs you to urinate at regular intervals (timed voiding). ? Doing Kegel exercises to strengthen your pelvic floor muscles, which support your bladder. Toning these muscles can help you control urination, even if your bladder muscles are overactive.  Special devices. This may include: ? Biofeedback, which uses sensors to help you become aware of your body's signals. ? Electrical stimulation, which uses electrodes placed inside the body (implanted) or outside the body. These electrodes send gentle pulses of electricity to strengthen the nerves or muscles that control the bladder. ? Women may use a plastic device that fits into the vagina and supports the bladder (pessary).  Medicines. ? Antibiotics to treat bladder infection. ? Antispasmodics to stop the bladder from releasing urine at the wrong time. ? Tricyclic antidepressants to relax bladder muscles. ? Injections of botulinum toxin type A directly into the bladder tissue to relax bladder muscles.  Lifestyle changes. This may  include: ? Weight loss. Talk to your health care provider about weight loss methods that would work best for you. ? Diet changes. This may include reducing how much alcohol and caffeine you consume, or drinking fluids at different times of the day. ? Not smoking. Do not use any products that contain nicotine or tobacco, such as cigarettes and e-cigarettes. If you need help quitting, ask your health care provider.  Surgery. ? A device may be implanted to help manage the nerve signals that control urination. ? An electrode may be implanted to stimulate electrical signals in the bladder. ? A procedure may be done to change the shape of the bladder. This is done only in very severe cases. Follow these instructions at home: Lifestyle  Make any diet or lifestyle changes that are recommended by your health care provider. These may include: ? Drinking less fluid or drinking fluids at different times of the day. ? Cutting down on caffeine or alcohol. ? Doing Kegel exercises. ? Losing weight if needed. ? Eating a healthy and balanced diet to prevent constipation. This may include:  Eating foods that are high in fiber, such as fresh fruits and vegetables, whole grains, and beans.  Limiting foods that are high in fat and processed sugars, such as fried and sweet foods. General instructions  Take over-the-counter and prescription medicines only as told by your health care provider.  If you were prescribed an antibiotic medicine, take it as told by your health care provider. Do not stop taking the antibiotic even if you start to feel better.  Use any implants or pessary as told by your health care provider.  If needed, wear pads to absorb urine leakage.  Keep a journal or log to track how much and when you drink and when you feel the need to urinate. This will help your health care provider monitor your condition.  Keep all follow-up visits as told by your health care provider. This is  important. Contact a health care provider if:  You have a fever.  Your symptoms do not get better with treatment.  Your pain and discomfort get worse.  You have more frequent urges to urinate. Get help right away if:  You are not able to control your bladder. Summary  Overactive bladder refers to a condition in which a person has a sudden need to pass urine.  Several conditions may lead to an overactive bladder.  Treatment for overactive bladder depends on the cause and severity of your condition.  Follow your health care provider's instructions about lifestyle changes, doing Kegel exercises, keeping a journal, and taking medicines. This information is not intended to replace advice given to you by your health care provider. Make sure you discuss any questions you have with your health care provider. Document Revised: 05/31/2018 Document Reviewed: 02/23/2017 Elsevier Patient Education  [place].  High Cholesterol  High cholesterol is a condition in which the blood has high levels of a white, waxy, fat-like substance (cholesterol). The human body needs small amounts of cholesterol. The liver makes all the cholesterol that the body needs. Extra (excess) cholesterol comes from the food that we eat. Cholesterol is carried from the liver by the blood through the blood vessels. If you have high cholesterol, deposits (plaques) may build up on the walls of your blood vessels (arteries). Plaques make the arteries narrower and stiffer. Cholesterol plaques increase your risk for heart attack and stroke. Work with your health care provider to keep your cholesterol levels in a healthy range. What increases the risk? This condition is more likely to develop in people who:  Eat foods that are high in animal fat (saturated fat) or cholesterol.  Are overweight.  Are not getting enough exercise.  Have a family history of high cholesterol. What are the signs or symptoms? There are no  symptoms of this condition. How is this diagnosed? This condition may be diagnosed from the results of a blood test.  If you are older than age 78, your health care provider may check your cholesterol every 4-6 years.  You may be checked more often if you already have high cholesterol or other risk factors for heart disease. The blood test for cholesterol measures:  "Bad" cholesterol (LDL cholesterol). This is the main type of cholesterol that causes heart disease. The desired level for LDL is less than 100.  "Good" cholesterol (HDL cholesterol). This type helps to protect against heart disease by cleaning the arteries and carrying the LDL away. The desired level for HDL is 60 or higher.  Triglycerides. These are fats that the body can store or burn for energy. The desired number for triglycerides is lower than 150.  Total cholesterol. This is a measure of the total amount of cholesterol in your blood, including LDL cholesterol, HDL cholesterol, and triglycerides. A healthy number is less than 200. How is this treated? This condition is  treated with diet changes, lifestyle changes, and medicines. Diet changes  This may include eating more whole grains, fruits, vegetables, nuts, and fish.  This may also include cutting back on red meat and foods that have a lot of added sugar. Lifestyle changes  Changes may include getting at least 40 minutes of aerobic exercise 3 times a week. Aerobic exercises include walking, biking, and swimming. Aerobic exercise along with a healthy diet can help you maintain a healthy weight.  Changes may also include quitting smoking. Medicines  Medicines are usually given if diet and lifestyle changes have failed to reduce your cholesterol to healthy levels.  Your health care provider may prescribe a statin medicine. Statin medicines have been shown to reduce cholesterol, which can reduce the risk of heart disease. Follow these instructions at home: Eating  and drinking If told by your health care provider:  Eat chicken (without skin), fish, veal, shellfish, ground [place] breast, and round or loin cuts of red meat.  Do not eat fried foods or fatty meats, such as hot dogs and salami.  Eat plenty of fruits, such as apples.  Eat plenty of vegetables, such as broccoli, potatoes, and carrots.  Eat beans, peas, and lentils.  Eat grains such as barley, rice, couscous, and bulgur wheat.  Eat pasta without cream sauces.  Use skim or nonfat milk, and eat low-fat or nonfat yogurt and cheeses.  Do not eat or drink whole milk, cream, ice cream, egg yolks, or hard cheeses.  Do not eat stick margarine or tub margarines that contain trans fats (also called partially hydrogenated oils).  Do not eat saturated tropical oils, such as coconut oil and palm oil.  Do not eat cakes, cookies, crackers, or other baked goods that contain trans fats.  General instructions  Exercise as directed by your health care provider. Increase your activity level with activities such as gardening, walking, and taking the stairs.  Take over-the-counter and prescription medicines only as told by your health care provider.  Do not use any products that contain nicotine or tobacco, such as cigarettes and e-cigarettes. If you need help quitting, ask your health care provider.  Keep all follow-up visits as told by your health care provider. This is important. Contact a health care provider if:  You are struggling to maintain a healthy diet or weight.  You need help to start on an exercise program.  You need help to stop smoking. Get help right away if:  You have chest pain.  You have trouble breathing. This information is not intended to replace advice given to you by your health care provider. Make sure you discuss any questions you have with your health care provider. Document Revised: 02/10/2017 Document Reviewed: 08/08/2015 Elsevier Patient Education  [place].  Cholesterol Content in Foods Cholesterol is a waxy, fat-like substance that helps to carry fat in the blood. The body needs cholesterol in small amounts, but too much cholesterol can cause damage to the arteries and heart. Most people should eat less than 200 milligrams (mg) of cholesterol a day. Foods with cholesterol  Cholesterol is found in animal-based foods, such as meat, seafood, and dairy. Generally, low-fat dairy and lean meats have less cholesterol than full-fat dairy and fatty meats. The milligrams of cholesterol per serving (mg per serving) of common cholesterol-containing foods are listed below. Meat and other proteins  Egg -- one large whole egg has 186 mg.  Veal shank -- 4 oz has 141 mg.  Lean ground  [place] (93% lean) -- 4 oz has 118 mg.  Fat-trimmed lamb loin -- 4 oz has 106 mg.  Lean ground beef (90% lean) -- 4 oz has 100 mg.  Lobster -- 3.5 oz has 90 mg.  Pork loin chops -- 4 oz has 86 mg.  Canned salmon -- 3.5 oz has 83 mg.  Fat-trimmed beef top loin -- 4 oz has 78 mg.  Frankfurter -- 1 frank (3.5 oz) has 77 mg.  Crab -- 3.5 oz has 71 mg.  Roasted chicken without skin, white meat -- 4 oz has 66 mg.  Light bologna -- 2 oz has 45 mg.  Deli-cut [place] -- 2 oz has 31 mg.  Canned tuna -- 3.5 oz has 31 mg.  Berniece Salines -- 1 oz has 29 mg.  Oysters and mussels (raw) -- 3.5 oz has 25 mg.  Mackerel -- 1 oz has 22 mg.  Trout -- 1 oz has 20 mg.  Pork sausage -- 1 link (1 oz) has 17 mg.  Salmon -- 1 oz has 16 mg.  Tilapia -- 1 oz has 14 mg. Dairy  Soft-serve ice cream --  cup (4 oz) has 103 mg.  Whole-milk yogurt -- 1 cup (8 oz) has 29 mg.  Cheddar cheese -- 1 oz has 28 mg.  American cheese -- 1 oz has 28 mg.  Whole milk -- 1 cup (8 oz) has 23 mg.  2% milk -- 1 cup (8 oz) has 18 mg.  Cream cheese -- 1 tablespoon (Tbsp) has 15 mg.  Cottage cheese --  cup (4 oz) has 14 mg.  Low-fat (1%) milk -- 1 cup (8 oz) has 10 mg.  Sour cream  -- 1 Tbsp has 8.5 mg.  Low-fat yogurt -- 1 cup (8 oz) has 8 mg.  Nonfat Greek yogurt -- 1 cup (8 oz) has 7 mg.  Half-and-half cream -- 1 Tbsp has 5 mg. Fats and oils  Cod liver oil -- 1 tablespoon (Tbsp) has 82 mg.  Butter -- 1 Tbsp has 15 mg.  Lard -- 1 Tbsp has 14 mg.  Bacon grease -- 1 Tbsp has 14 mg.  Mayonnaise -- 1 Tbsp has 5-10 mg.  Margarine -- 1 Tbsp has 3-10 mg. Exact amounts of cholesterol in these foods may vary depending on specific ingredients and brands. Foods without cholesterol Most plant-based foods do not have cholesterol unless you combine them with a food that has cholesterol. Foods without cholesterol include:  Grains and cereals.  Vegetables.  Fruits.  Vegetable oils, such as olive, canola, and sunflower oil.  Legumes, such as peas, beans, and lentils.  Nuts and seeds.  Egg whites. Summary  The body needs cholesterol in small amounts, but too much cholesterol can cause damage to the arteries and heart.  Most people should eat less than 200 milligrams (mg) of cholesterol a day. This information is not intended to replace advice given to you by your health care provider. Make sure you discuss any questions you have with your health care provider. Document Revised: 01/20/2017 Document Reviewed: 10/04/2016 Elsevier Patient Education  [place].  Spinal Stenosis  Spinal stenosis occurs when the open space (spinal canal) between the bones of your spine (vertebrae) narrows, putting pressure on the spinal cord or nerves. What are the causes? This condition is caused by areas of bone pushing into the central canals of your vertebrae. This condition may be present at birth (congenital), or it may be caused by:  Arthritic deterioration of your  vertebrae (spinal degeneration). This usually starts around age 44.  Injury or trauma to the spine.  Tumors in the spine.  Calcium deposits in the spine. What are the signs or symptoms? Symptoms  of this condition include:  Pain in the neck or back that is generally worse with activities, particularly when standing and walking.  Numbness, tingling, hot or cold sensations, weakness, or weariness in your legs.  Pain going up and down the leg (sciatica).  Frequent episodes of falling.  A foot-slapping gait that leads to muscle weakness. In more serious cases, you may develop:  Problemspassing stool or passing urine.  Difficulty having sex.  Loss of feeling in part or all of your leg. Symptoms may come on slowly and get worse over time. How is this diagnosed? This condition is diagnosed based on your medical history and a physical exam. Tests will also be done, such as:  MRI.  CT scan.  X-ray. How is this treated? Treatment for this condition often focuses on managing your pain and any other symptoms. Treatment may include:  Practicing good posture to lessen pressure on your nerves.  Exercising to strengthen muscles, build endurance, improve balance, and maintain good joint movement (range of motion).  Losing weight, if needed.  Taking medicines to reduce swelling, inflammation, or pain.  Assistive devices, such as a corset or brace. In some cases, surgery may be needed. The most common procedure is decompression laminectomy. This is done to remove excess bone that puts pressure on your nerve roots. Follow these instructions at home: Managing pain, stiffness, and swelling  Do all exercises and stretches as told by your health care provider.  Practice good posture. If you were given a brace or a corset, wear it as told by your health care provider.  Do not do any activities that cause pain. Ask your health care provider what activities are safe for you.  Do not lift anything that is heavier than 10 lb (4.5 kg) or the limit that your health care provider tells you.  Maintain a healthy weight. Talk with your health care provider if you need help losing  weight.  If directed, apply heat to the affected area as often as told by your health care provider. Use the heat source that your health care provider recommends, such as a moist heat pack or a heating pad. ? Place a towel between your skin and the heat source. ? Leave the heat on for 20-30 minutes. ? Remove the heat if your skin turns bright red. This is especially important if you are not able to feel pain, heat, or cold. You may have a greater risk of getting burned. General instructions  Take over-the-counter and prescription medicines only as told by your health care provider.  Do not use any products that contain nicotine or tobacco, such as cigarettes and e-cigarettes. If you need help quitting, ask your health care provider.  Eat a healthy diet. This includes plenty of fruits and vegetables, whole grains, and low-fat (lean) protein.  Keep all follow-up visits as told by your health care provider. This is important. Contact a health care provider if:  Your symptoms do not get better or they get worse.  You have a fever. Get help right away if:  You have new or worse pain in your neck or upper back.  You have severe pain that cannot be controlled with medicines.  You are dizzy.  You have vision problems, blurred vision, or double vision.  You  have a severe headache that is worse when you stand.  You have nausea or you vomit.  You develop new or worse numbness or tingling in your back or legs.  You have pain, redness, swelling, or warmth in your arm or leg. Summary  Spinal stenosis occurs when the open space (spinal canal) between the bones of your spine (vertebrae) narrows. This narrowing puts pressure on the spinal cord or nerves.  Spinal stenosis can cause numbness, weakness, or pain in the neck, back, and legs.  This condition may be caused by a birth defect, arthritic deterioration of your vertebrae, injury, tumors, or calcium deposits.  This condition is  usually diagnosed with MRIs, CT scans, and X-rays. This information is not intended to replace advice given to you by your health care provider. Make sure you discuss any questions you have with your health care provider. Document Revised: 01/20/2017 Document Reviewed: 01/13/2016 Elsevier Patient Education  2020 [place] refers to food and lifestyle choices that are based on the traditions of countries located on the [employer]. This way of eating has been shown to help prevent certain conditions and improve outcomes for people who have chronic diseases, like kidney disease and heart disease. What are tips for following this plan? Lifestyle  Cook and eat meals together with your family, when possible.  Drink enough fluid to keep your urine clear or pale yellow.  Be physically active every day. This includes: ? Aerobic exercise like running or swimming. ? Leisure activities like gardening, walking, or housework.  Get 7-8 hours of sleep each night.  If recommended by your health care provider, drink red wine in moderation. This means 1 glass a day for nonpregnant women and 2 glasses a day for men. A glass of wine equals 5 oz (150 mL). Reading food labels   Check the serving size of packaged foods. For foods such as rice and pasta, the serving size refers to the amount of cooked product, not dry.  Check the total fat in packaged foods. Avoid foods that have saturated fat or trans fats.  Check the ingredients list for added sugars, such as corn syrup. Shopping  At the grocery store, buy most of your food from the areas near the walls of the store. This includes: ? Fresh fruits and vegetables (produce). ? Grains, beans, nuts, and seeds. Some of these may be available in unpackaged forms or large amounts (in bulk). ? Fresh seafood. ? Poultry and eggs. ? Low-fat dairy products.  Buy whole ingredients instead of prepackaged  foods.  Buy fresh fruits and vegetables in-season from local farmers markets.  Buy frozen fruits and vegetables in resealable bags.  If you do not have access to quality fresh seafood, buy precooked frozen shrimp or canned fish, such as tuna, salmon, or sardines.  Buy small amounts of raw or cooked vegetables, salads, or olives from the deli or salad bar at your store.  Stock your pantry so you always have certain foods on hand, such as olive oil, canned tuna, canned tomatoes, rice, pasta, and beans. Cooking  Cook foods with extra-virgin olive oil instead of using butter or other vegetable oils.  Have meat as a side dish, and have vegetables or grains as your main dish. This means having meat in small portions or adding small amounts of meat to foods like pasta or stew.  Use beans or vegetables instead of meat in common dishes like chili or lasagna.  Experiment with different cooking methods. Try roasting or broiling vegetables instead of steaming or sauteing them.  Add frozen vegetables to soups, stews, pasta, or rice.  Add nuts or seeds for added healthy fat at each meal. You can add these to yogurt, salads, or vegetable dishes.  Marinate fish or vegetables using olive oil, lemon juice, garlic, and fresh herbs. Meal planning   Plan to eat 1 vegetarian meal one day each week. Try to work up to 2 vegetarian meals, if possible.  Eat seafood 2 or more times a week.  Have healthy snacks readily available, such as: ? Vegetable sticks with hummus. ? [place] yogurt. ? Fruit and nut trail mix.  Eat balanced meals throughout the week. This includes: ? Fruit: 2-3 servings a day ? Vegetables: 4-5 servings a day ? Low-fat dairy: 2 servings a day ? Fish, poultry, or lean meat: 1 serving a day ? Beans and legumes: 2 or more servings a week ? Nuts and seeds: 1-2 servings a day ? Whole grains: 6-8 servings a day ? Extra-virgin olive oil: 3-4 servings a day  Limit red meat and sweets  to only a few servings a month What are my food choices?  Mediterranean diet ? Recommended  Grains: Whole-grain pasta. Brown rice. Bulgar wheat. Polenta. Couscous. Whole-wheat bread. [place].  Vegetables: Artichokes. Beets. Broccoli. Cabbage. Carrots. Eggplant. Green beans. Chard. Kale. Spinach. Onions. Leeks. Peas. Squash. Tomatoes. Peppers. Radishes.  Fruits: Apples. Apricots. Avocado. Berries. Bananas. Cherries. Dates. Figs. Grapes. Lemons. Melon. Oranges. Peaches. Plums. Pomegranate.  Meats and other protein foods: Beans. Almonds. Sunflower seeds. Pine nuts. Peanuts. Brave. Salmon. Scallops. Shrimp. [place]. Tilapia. Clams. Oysters. Eggs.  Dairy: Low-fat milk. Cheese. Greek yogurt.  Beverages: Water. Red wine. Herbal tea.  Fats and oils: Extra virgin olive oil. Avocado oil. Grape seed oil.  Sweets and desserts: [place] yogurt with honey. Baked apples. Poached pears. Trail mix.  Seasoning and other foods: Basil. Cilantro. Coriander. Cumin. Mint. Parsley. Sage. Rosemary. Tarragon. Garlic. Oregano. Thyme. Pepper. Balsalmic vinegar. Tahini. Hummus. Tomato sauce. Olives. Mushrooms. ? Limit these  Grains: Prepackaged pasta or rice dishes. Prepackaged cereal with added sugar.  Vegetables: Deep fried potatoes (french fries).  Fruits: Fruit canned in syrup.  Meats and other protein foods: Beef. Pork. Lamb. Poultry with skin. Hot dogs. [place] Salines.  Dairy: Ice cream. Sour cream. Whole milk.  Beverages: Juice. Sugar-sweetened soft drinks. Beer. Liquor and spirits.  Fats and oils: Butter. Canola oil. Vegetable oil. Beef fat (tallow). Lard.  Sweets and desserts: Cookies. Cakes. Pies. Candy.  Seasoning and other foods: Mayonnaise. Premade sauces and marinades. The items listed may not be a complete list. Talk with your dietitian about what dietary choices are right for you. Summary  The Mediterranean diet includes both food and lifestyle choices.  Eat a variety of fresh fruits and  vegetables, beans, nuts, seeds, and whole grains.  Limit the amount of red meat and sweets that you eat.  Talk with your health care provider about whether it is safe for you to drink red wine in moderation. This means 1 glass a day for nonpregnant women and 2 glasses a day for men. A glass of wine equals 5 oz (150 mL). This information is not intended to replace advice given to you by your health care provider. Make sure you discuss any questions you have with your health care provider. Document Revised: 10/08/2015 Document Reviewed: 10/01/2015 Elsevier Patient Education  [place].

## 2020-01-08 NOTE — Progress Notes (Signed)
Chief Complaint  Patient presents with  . Follow-up  . Pain    ongoing 1 month. Pain in the right sided neck, shoulder, and arm at night    F/u with daughter Tamia 1. Grief and anxiety due to death of dear friend Vickii Chafe since high school and wants refill of xanax   2. Falls x 2 at home and having right shoulder pain and right upper arm she wants a taller rolling walker with seat and brakes given Rx for this today she is using a cane but wants to use a walker to prevent falls. Gait is off and she feels like leaning to right side. Bending forward helps her low back pain and she feels like she has right sided weakness  -daughter also asks about PCS (even if they can come 1 x per month) as pt has trouble watering plants, cleaning, washing clothes and getting clothes to the dryer  -her daughter lives back and forth [place] and [place]  Of note due to abnormal neck Xray neurology sch appt 01/21/20 for dry needling and rec consider MRI cervical and EMG/NCS upper ext and pt wants to wait on this until after dry needling appt and home PT  3. Chronic pain right sided neck and low back and right shoulder/upper arm >left shoulder pain 9/10. Tried Voltaren gel did not help much pending dry needling 01/21/20 ortho per neurology and neurology considering MRI cervical and EMG/NCS pt wants to wait for this for now   FINDINGS: Seven cervical segments are well visualized. Vertebral body height is well maintained. Disc space narrowing is noted at C5-6. Osteophytic changes are noted at C4-5, C5-6 and C6-7. Very mild degenerative anterolisthesis of C4 on C5 is noted. Mild neural foraminal narrowing is noted at C6-7 bilaterally, and C5-6 on the right. No gross soft tissue abnormality is seen. The odontoid is within normal limits. Facet hypertrophic changes are seen. No acute fractures noted.  IMPRESSION: Multilevel degenerative change without acute abnormality.   Electronically Signed   By: Inez Catalina M.D.    On: 12/03/2015 13:52  MRI Lumbar  IMPRESSION: 1. Severe stenosis is redemonstrated at L3-4. Serpiginous nerve roots imply significant compromise of the thecal sac. BILATERAL L4 neural impingement is demonstrated, not clearly leftward predominant. Consider standing flexion extension views to evaluate for dynamic instability at this level. 2. Mild stenosis at L4-5 with mild to moderate subarticular zone narrowing, slightly worse on the LEFT. 3. BILATERAL foraminal narrowing at L5-S1 could affect either L5 nerve root. 4. LEFT paracentral disc extrusion at L1-2 with cephalad migration, noncompressive.   Electronically Signed   By: Staci Righter M.D.   On: 12/02/2018 21:50  4. HTN on norvasc 5 mg qd, spironolactone 12.5 mg in am, toprol xl 25 mg qd, lasix 40 mg qd BP sl elevated today  Due for cards f/u Dr. Tamala Julian as well     Review of Systems  Constitutional: Negative for weight loss.  HENT: Negative for hearing loss.   Eyes: Negative for blurred vision.  Respiratory: Negative for shortness of breath.   Cardiovascular: Negative for chest pain.  Gastrointestinal:       +right side of abdomen hangs lower than left    Musculoskeletal: Positive for back pain, falls, joint pain and neck pain.  Skin: Negative for rash.  Neurological: Positive for weakness.       +right sided weakness   Psychiatric/Behavioral: The patient is nervous/anxious.    Past Medical History:  Diagnosis Date  . Allergic  rhinitis   . Anxiety   . Benign neoplasm of breast 2013  . Cardiac arrhythmia   . Cardiomegaly   . Cataract   . CHF (congestive heart failure) ([place])   . Compulsive eating patterns   . Compulsive overeating   . Depression   . Diastolic heart failure ([facility])    LVH with free wall thickness 1.4 cm EF 60% Dr. Tamala Julian   . Diffuse cystic mastopathy 2013  . Edema   . Epistaxis    noted 12/20/18   . GI bleeding    in ~2018   . Gout   . Hiatal hernia   . HTN (hypertension)   .  Hyperlipidemia   . Lump or mass in breast 2012  . OA (osteoarthritis) of knee    with injections  . Obesity   . OSA (obstructive sleep apnea)    not on cpap  . Other abnormal glucose   . Peptic stricture of esophagus   . Personal history of tobacco use, presenting hazards to health   . Sciatica   . Sinus infection 2010  . Spinal stenosis    lumbar   . Stroke ([place])   . Unspecified sleep apnea    Past Surgical History:  Procedure Laterality Date  . adenosine cardiolite  1/08   low risk   . APPENDECTOMY    . BREAST BIOPSY     02/28/11 negative  . BREAST LUMPECTOMY     right x1 ('89) left x2 ('70s, '90)  . CARDIAC CATHETERIZATION  2001   normal per pt  . COLONOSCOPY  11/02   diverticulosis  . COLONOSCOPY  9/06   diverticulosis, hemorhoids  . COLONOSCOPY N/A 10/01/2016   Procedure: COLONOSCOPY;  Surgeon: Gatha Mayer, MD;  Location: [facility];  Service: Endoscopy;  Laterality: N/A;  . dexa  3/02   normal  . EYE SURGERY  2012   cataract  . IR CT HEAD LTD  09/01/2018  . IR PERCUTANEOUS ART THROMBECTOMY/INFUSION INTRACRANIAL INC DIAG ANGIO  09/01/2018  . JOINT REPLACEMENT     knee b/l 2011  . knee replacement Right 9/11   R. Dr. Telford Nab  . RADIOLOGY WITH ANESTHESIA N/A 09/01/2018   Procedure: IR WITH ANESTHESIA;  Surgeon: Luanne Bras, MD;  Location: [facility];  Service: Radiology;  Laterality: N/A;  . sleep study  5/05  . TONSILLECTOMY    . TOTAL ABDOMINAL HYSTERECTOMY     fibroids, age of 17  . TOTAL KNEE ARTHROPLASTY Left 2012  . US TRANSVAGINAL PELVIC MODIFIED  2000, 2001   Family History  Problem Relation Age of Onset  . Stroke Father   . Hypertension Mother        (a lot of animosity in their relationship)  . Heart failure Mother   . Gout Other        whole family   . Prostate cancer Brother   . Cancer Brother        prostate   . Hepatitis Brother        Hep C; alcoholism (terminal)  . Drug abuse Brother        died 40  . Breast cancer Sister   .  Cancer Sister        breast cancer  . Other Brother        drug addiction   Social History   Socioeconomic History  . Marital status: Widowed    Spouse name: Not on file  . Number of children: 1  .  Years of education: Not on file  . Highest education level: Not on file  Occupational History  . Occupation: Retired Ship broker: RETIRED  Tobacco Use  . Smoking status: Former Smoker    Packs/day: 0.50    Years: 20.00    Pack years: 10.00    Types: Cigarettes  . Smokeless tobacco: Never Used  . Tobacco comment: quit approx. 20 years ago  Vaping Use  . Vaping Use: Never used  Substance and Sexual Activity  . Alcohol use: Yes    Alcohol/week: 0.0 standard drinks    Comment: wine occasional  . Drug use: No  . Sexual activity: Never  Other Topics Concern  . Not on file  Social History Narrative   Widowed x 30+ years as of 12/2018    1 daughter. 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 Difficulty of Paying Living Expenses: Not on file  Food Insecurity:   . Worried About Charity fundraiser in the Last Year: Not on file  . Ran Out of Food in the Last Year: Not on file  Transportation Needs:   . Lack of Transportation (Medical): Not on file  . Lack of Transportation (Non-Medical): Not on file  Physical Activity:   . Days of Exercise per Week: Not on file  . Minutes of Exercise per Session: Not on file  Stress:   . Feeling of Stress : Not on file  Social Connections:   . Frequency of Communication with Friends and Family: Not on file  . Frequency of Social Gatherings with Friends and Family: Not on file  . Attends Religious Services: Not on file  . Active Member of Clubs or Organizations: Not on file  . Attends Archivist Meetings: Not on file  . Marital Status: Not on file  Intimate Partner Violence:   . Fear of  Current or Ex-Partner: Not on file  . Emotionally Abused: Not on file  . Physically Abused: Not on file  . Sexually Abused: Not on file   Current Meds  Medication Sig  . allopurinol (ZYLOPRIM) 100 MG tablet TAKE 2 TABLETS BY MOUTH ONCE A DAY  . ALPRAZolam (XANAX) 0.25 MG tablet Take 1 tablet (0.25 mg total) by mouth 2 (two) times daily as needed for anxiety.  . Ascorbic Acid (VITAMIN C PO) Take by mouth.  Marland Kitchen atorvastatin (LIPITOR) 20 MG tablet Take 1 tablet (20 mg total) by mouth daily. At night. STOP/discontinue Lovastatin 20  . Cholecalciferol (VITAMIN D3 PO) Take by mouth.  . colchicine 0.6 MG tablet Take 1 tablet (0.6 mg total) by mouth daily.  . diclofenac Sodium (VOLTAREN) 1 % GEL Apply 2 g topically 4 (four) times daily as needed.  . DULoxetine (CYMBALTA) 30 MG capsule Take 1 capsule (30 mg total) by mouth daily.  Marland Kitchen ELIQUIS 5 MG TABS tablet TAKE 1 TABLET BY MOUTH TWICE A DAY  . furosemide (LASIX) 40 MG tablet Take 1 tablet (40 mg total) by mouth daily. Please make yearly appt with Dr. Tamala Julian for December for future refills. Thank you 1st attempt  . gabapentin (NEURONTIN) 300 MG capsule Take 1 capsule (300 mg total) by mouth 3 (three) times daily as needed.  Marland Kitchen HYDROcodone-acetaminophen (NORCO/VICODIN) 5-325 MG tablet Take 1 tablet by mouth 2 (two) times daily as needed for moderate pain.  Marland Kitchen  KLOR-CON M20 20 MEQ tablet TAKE 1 TABLET BY MOUTH EVERY DAY  . Magnesium 250 MG TABS Take 250 mg by mouth daily.  . metoprolol succinate (TOPROL-XL) 100 MG 24 hr tablet TAKE 1 TABLET BY MOUTH DAILY WITH OR IMMEDIATLY FOLLOWING A MEAL  . Multiple Vitamins-Minerals (MULTIVITAMIN WOMEN 50+ PO) Take by mouth.  . potassium chloride SA (KLOR-CON) 10 MEQ tablet Take 1 tablet (10 mEq total) by mouth daily.  Marland Kitchen spironolactone (ALDACTONE) 25 MG tablet Take 0.5 tablets (12.5 mg total) by mouth daily. In the am Please make yearly appt with Dr. Tamala Julian for December for future refills. Thank you  . [DISCONTINUED]  allopurinol (ZYLOPRIM) 100 MG tablet TAKE 2 TABLETS BY MOUTH ONCE A DAY  . [DISCONTINUED] ALPRAZolam (XANAX) 0.25 MG tablet Take 1 tablet (0.25 mg total) by mouth 2 (two) times daily as needed for anxiety.  . [DISCONTINUED] ALPRAZolam (XANAX) 0.25 MG tablet Take 1 tablet (0.25 mg total) by mouth 2 (two) times daily as needed for anxiety.  . [DISCONTINUED] DULoxetine (CYMBALTA) 30 MG capsule Take 1 capsule (30 mg total) by mouth daily.  . [DISCONTINUED] metoprolol succinate (TOPROL-XL) 100 MG 24 hr tablet TAKE 1 TABLET BY MOUTH DAILY WITH OR IMMEDIATLY FOLLOWING A MEAL  . [DISCONTINUED] spironolactone (ALDACTONE) 25 MG tablet Take 0.5 tablets (12.5 mg total) by mouth daily. Please make yearly appt with Dr. Tamala Julian for December for future refills. Thank you 1st attempt   Allergies  Allergen Reactions  . Buspirone Hcl Other (See Comments)    REACTION: made her sleepy, ? swollen legs  . Lisinopril Other (See Comments)    REACTION: dizziness  . Prozac [Fluoxetine Hcl] Other (See Comments)    sleepy   No results found for this or any previous visit (from the past 2160 hour(s)). Objective  Body mass index is 39.41 kg/m. Wt Readings from Last 3 Encounters:  01/08/20 251 lb 9.6 oz (114.1 kg)  12/30/19 252 lb (114.3 kg)  07/24/19 249 lb 8 oz (113.2 kg)   Temp Readings from Last 3 Encounters:  01/08/20 98 F (36.7 C) (Oral)  06/06/19 (!) 96 F (35.6 C) (Temporal)  05/31/19 98.7 F (37.1 C) (Oral)   BP Readings from Last 3 Encounters:  01/08/20 140/88  12/30/19 138/88  07/24/19 101/68   Pulse Readings from Last 3 Encounters:  01/08/20 77  12/30/19 70  07/24/19 67    Physical Exam Vitals and nursing note reviewed.  Constitutional:      Appearance: Normal appearance. She is well-developed and well-groomed. She is obese.  HENT:     Head: Normocephalic and atraumatic.  Eyes:     Conjunctiva/sclera: Conjunctivae normal.     Pupils: Pupils are equal, round, and reactive to light.   Cardiovascular:     Rate and Rhythm: Normal rate and regular rhythm.     Heart sounds: Normal heart sounds. No murmur heard.   Pulmonary:     Effort: Pulmonary effort is normal.     Breath sounds: Normal breath sounds.  Abdominal:    Skin:    General: Skin is warm and dry.  Neurological:     General: No focal deficit present.     Mental Status: She is alert and oriented to person, place, and time. Mental status is at baseline.     Gait: Gait normal.  Psychiatric:        Attention and Perception: Attention and perception normal.        Mood and Affect: Mood and affect normal.  Speech: Speech normal.        Behavior: Behavior normal. Behavior is cooperative.        Thought Content: Thought content normal.        Cognition and Memory: Cognition and memory normal.        Judgment: Judgment normal.     Comments: +tearful on exam      Assessment  Plan  Anxietygrief - Plan: ALPRAZolam (XANAX) 0.25 MG tablet bid prn Cont cymbalta 30 mg qd Daughter tamia is good support   Abnormal gait - Plan: Ambulatory referral to [facility] 2-3 x per week x 2-3 months Rx adjustable ht rollator with seat and brakes using cane now and now helping alone prevent falls -daughter also asks PCS for ADLS, IADLS watering plants, laundry/cleaning ok if can even come 1x per month  Recurrent falls  Physical deconditioning Spinal stenosis of lumbar region,  Right sided weakness s/p stroke  Cervicalgia -consider MRI cervical and NCS/EMG with neurology dry needling appt 01/21/20  Bilateral shoulder pain, unspecified chronicity   Hypertension, elevated today slightly - Plan: amLODipine (NORVASC) 5 MG tablet up to bid if BP >130/>80  Spirolactone 12.5 mg qd and lasix 40 mg qd  toprol xl 100 mg qd  F/u Dr. Tamala Julian  Refilled all meds for now  Gout, unspecified cause, unspecified chronicity, unspecified site - Plan: allopurinol (ZYLOPRIM) 100 MG tablet  Other chronic pain - Plan: DULoxetine  (CYMBALTA) 30 MG capsule per daughter this is helping also will get PT on board   HM Flu shot utd  prevnar utd  pna 23 due had 12/03/13  Tdap per prior PCP had 12/19/16  consider shingrix in future  covid vx 3/3  Out of age window pap s/p hysterectomy? What is left in I.e ovaries possibly b/l ovaries out [place] Colonoscopy8/18/18 negative Dr. Carlean Purl dexa 03/05/15 normal  Former smokerage 20-40 max 1ppd  CT ab pelvis 03/19/19 IMPRESSION: 1. No acute findings or explanation for the patient's symptoms. No evidence of malignancy. 2. Enlarging bilateral renal cysts. 3. 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 Recommend followup by ultrasound in 3 years. This recommendation follows ACR consensus guidelines: White Paper of the ACR Incidental Findings Committee II on Vascular Findings. J Am Coll Radiol 2013; 10:789-794. Aortic Atherosclerosis (ICD10-I70.0). 4. Additional incidental findings including lower lumbar spondylosis, small umbilical hernia containing only fat and probable pelvic floor laxity. Adrenals/Urinary Tract: Stable mild prominence of both adrenal glands without focal mass lesion. Enlarging bilateral renal cysts. No evidence of enhancing renal mass, urinary tract calculus or hydronephrosis. No focal bladder abnormality.   Prior PCPs Dr. Lacinda Axon and Dr. Delfina Redwood in [facility]  Cards Dr. Tamala Julian est ob/gyn    Provider: Dr. Olivia Mackie McLean-Scocuzza-Internal Medicine

## 2020-01-10 ENCOUNTER — Telehealth: Payer: Self-pay | Admitting: Internal Medicine

## 2020-01-10 NOTE — Telephone Encounter (Signed)
Left message for patient to call back and schedule Medicare Annual Wellness Visit (AWV)   This should be a virtual or telephone visit only=30 minutes.  Last AWV 02/11/15; please schedule at anytime with Denisa O'Brien-Blaney at [facility].

## 2020-01-13 ENCOUNTER — Telehealth: Payer: Self-pay | Admitting: Internal Medicine

## 2020-01-13 DIAGNOSIS — F4321 Adjustment disorder with depressed mood: Secondary | ICD-10-CM | POA: Insufficient documentation

## 2020-01-13 DIAGNOSIS — M25512 Pain in left shoulder: Secondary | ICD-10-CM | POA: Insufficient documentation

## 2020-01-13 DIAGNOSIS — M542 Cervicalgia: Secondary | ICD-10-CM

## 2020-01-13 DIAGNOSIS — R296 Repeated falls: Secondary | ICD-10-CM | POA: Insufficient documentation

## 2020-01-13 DIAGNOSIS — N281 Cyst of kidney, acquired: Secondary | ICD-10-CM

## 2020-01-13 DIAGNOSIS — M25511 Pain in right shoulder: Secondary | ICD-10-CM | POA: Insufficient documentation

## 2020-01-13 DIAGNOSIS — I714 Abdominal aortic aneurysm, without rupture, unspecified: Secondary | ICD-10-CM | POA: Insufficient documentation

## 2020-01-13 HISTORY — DX: Pain in right shoulder: M25.511

## 2020-01-13 HISTORY — DX: Cervicalgia: M54.2

## 2020-01-13 HISTORY — DX: Adjustment disorder with depressed mood: F43.21

## 2020-01-13 HISTORY — DX: Repeated falls: R29.6

## 2020-01-13 HISTORY — DX: Cyst of kidney, acquired: N28.1

## 2020-01-13 MED ORDER — AMLODIPINE BESYLATE 5 MG PO TABS: 5.0000 mg | ORAL_TABLET | Freq: Every day | ORAL | 3 refills | Status: DC

## 2020-01-13 MED ORDER — DULOXETINE HCL 30 MG PO CPEP: 30.0000 mg | ORAL_CAPSULE | Freq: Every day | ORAL | 3 refills | Status: DC

## 2020-01-13 MED ORDER — METOPROLOL SUCCINATE ER 100 MG PO TB24: ORAL_TABLET | ORAL | 3 refills | Status: DC

## 2020-01-13 MED ORDER — ALLOPURINOL 100 MG PO TABS: ORAL_TABLET | ORAL | 3 refills | Status: DC

## 2020-01-13 MED ORDER — SPIRONOLACTONE 25 MG PO TABS
12.5000 mg | ORAL_TABLET | Freq: Every day | ORAL | 1 refills | Status: DC
Start: 2020-01-13 — End: 2020-08-10

## 2020-01-13 NOTE — Telephone Encounter (Signed)
lft vm at [employer]

## 2020-01-14 ENCOUNTER — Telehealth: Payer: Self-pay | Admitting: Internal Medicine

## 2020-01-14 NOTE — Telephone Encounter (Signed)
lft vm for pt daughter Monica [name] to call ofc

## 2020-01-20 ENCOUNTER — Telehealth: Payer: Self-pay | Admitting: Interventional Cardiology

## 2020-01-20 DIAGNOSIS — I69351 Hemiplegia and hemiparesis following cerebral infarction affecting right dominant side: Secondary | ICD-10-CM | POA: Diagnosis not present

## 2020-01-20 DIAGNOSIS — G8929 Other chronic pain: Secondary | ICD-10-CM | POA: Diagnosis not present

## 2020-01-20 DIAGNOSIS — I5032 Chronic diastolic (congestive) heart failure: Secondary | ICD-10-CM | POA: Diagnosis not present

## 2020-01-20 DIAGNOSIS — I110 Hypertensive heart disease with heart failure: Secondary | ICD-10-CM | POA: Diagnosis not present

## 2020-01-20 DIAGNOSIS — M48061 Spinal stenosis, lumbar region without neurogenic claudication: Secondary | ICD-10-CM | POA: Diagnosis not present

## 2020-01-20 DIAGNOSIS — M5010 Cervical disc disorder with radiculopathy, unspecified cervical region: Secondary | ICD-10-CM | POA: Diagnosis not present

## 2020-01-20 DIAGNOSIS — E785 Hyperlipidemia, unspecified: Secondary | ICD-10-CM | POA: Diagnosis not present

## 2020-01-20 DIAGNOSIS — M109 Gout, unspecified: Secondary | ICD-10-CM | POA: Diagnosis not present

## 2020-01-20 DIAGNOSIS — F418 Other specified anxiety disorders: Secondary | ICD-10-CM | POA: Diagnosis not present

## 2020-01-20 NOTE — Telephone Encounter (Signed)
New message:     Patient stating that her pharmacy stating that her Eliquis will 100 percent starting on Jan.1 2022. Please call patient.

## 2020-01-20 NOTE — Telephone Encounter (Signed)
Pt states she received a letter from her insurance stating that as of 02/22/2020 they will no longer cover Eliquis.  They will cover Xarelto and Warfarin.  Pt states she has been paying $299 for her Eliquis.  Advised I will send to Dr. Tamala Julian to see about switching to Xarelto and if cost is too high, let us know and we can do Warfarin.

## 2020-01-20 NOTE — Telephone Encounter (Signed)
I called and spoke with both the patients pharmacy and patient. Patient has both a part D plan and the state health plan. She has not been using the state health plan to pay for anything. It is the state health plan that is saying she has to go to Xarelto.  She is eligible to use a copay card if she uses her state health plan instead of paying $45/month she could pay $10. I called and spoke with patient and explained this to her. I have activated her a copay card and she will fill a 90 day supply of Eliquis for $30 to tide her until Feb. We will submit a prior authorization for Eliquis come Jan 1. If we are successful patient will stay on Eliquis. If not, she can either use her [employer] to pay for Eliquis or we can switch her to Xarelto at that time and get her a copay card.  I called in the Rx coupon. Processed for $30/90 days.

## 2020-01-20 NOTE — Telephone Encounter (Signed)
WOW!! Great and thanks!

## 2020-01-20 NOTE — Telephone Encounter (Signed)
Switch to appropriate dose Xarelto.

## 2020-01-21 ENCOUNTER — Ambulatory Visit: Payer: Medicare HMO | Attending: Adult Health | Admitting: Physical Therapy

## 2020-01-21 ENCOUNTER — Other Ambulatory Visit: Payer: Self-pay

## 2020-01-21 DIAGNOSIS — M6281 Muscle weakness (generalized): Secondary | ICD-10-CM

## 2020-01-21 DIAGNOSIS — M542 Cervicalgia: Secondary | ICD-10-CM

## 2020-01-21 NOTE — Addendum Note (Signed)
Addended by: Dorothea Ogle on: 01/21/2020 05:54 PM   Modules accepted: Orders

## 2020-01-21 NOTE — Therapy (Addendum)
[place], [place], [address] Phone: [phone]   Fax:  [phone]  Physical Therapy Evaluation/Discharge Note  Patient Details  Name: Monica [name] MRN: [id number] Date of Birth: Oct 27, 1935 Referring Provider (PT): Laban Emperor, Janett Billow NP   Encounter Date: 01/21/2020   PT End of Session - 01/21/20 1732     Visit Number 1    PT Start Time 3235   arrived late to appt Eval            Past Medical History:  Diagnosis Date   Allergic rhinitis    Anxiety    Benign neoplasm of breast 2013   Cardiac arrhythmia    Cardiomegaly    Cataract    CHF (congestive heart failure) (HCC)    Compulsive eating patterns    Compulsive overeating    Depression    Diastolic heart failure (HCC)    LVH with free wall thickness 1.4 cm EF 60% Dr. Tamala Julian    Diffuse cystic mastopathy 2013   Edema    Epistaxis    noted 12/20/18    GI bleeding    in ~2018    Gout    Hiatal hernia    HTN (hypertension)    Hyperlipidemia    Lump or mass in breast 2012   OA (osteoarthritis) of knee    with injections   Obesity    OSA (obstructive sleep apnea)    not on cpap   Other abnormal glucose    Peptic stricture of esophagus    Personal history of tobacco use, presenting hazards to health    Sciatica    Sinus infection 2010   Spinal stenosis    lumbar    Stroke ([place])    Unspecified sleep apnea     Past Surgical History:  Procedure Laterality Date   adenosine cardiolite  1/08   low risk    APPENDECTOMY     BREAST BIOPSY     02/28/11 negative   BREAST LUMPECTOMY     right x1 ('89) left x2 ('70s, '90)   CARDIAC CATHETERIZATION  2001   normal per pt   COLONOSCOPY  11/02   diverticulosis   COLONOSCOPY  9/06   diverticulosis, hemorhoids   COLONOSCOPY N/A 10/01/2016   Procedure: COLONOSCOPY;  Surgeon: Gatha Mayer, MD;  Location: [facility];  Service: Endoscopy;  Laterality: N/A;   dexa  3/02   normal   EYE  SURGERY  2012   cataract   IR CT HEAD LTD  09/01/2018   IR PERCUTANEOUS ART THROMBECTOMY/INFUSION INTRACRANIAL INC DIAG ANGIO  09/01/2018   JOINT REPLACEMENT     knee b/l 2011   knee replacement Right 9/11   R. Dr. Telford Nab   RADIOLOGY WITH ANESTHESIA N/A 09/01/2018   Procedure: IR WITH ANESTHESIA;  Surgeon: Luanne Bras, MD;  Location: [facility];  Service: Radiology;  Laterality: N/A;   sleep study  5/05   TONSILLECTOMY     TOTAL ABDOMINAL HYSTERECTOMY     fibroids, age of 61   TOTAL KNEE ARTHROPLASTY Left 2012   US TRANSVAGINAL PELVIC MODIFIED  2000, 2001    There were no vitals filed for this visit.    Subjective Assessment - 01/21/20 1518     Subjective I have spinal stenosis and I feel better leaning over.  I have been having neck pain has gotten worse. Sometimes I wake up with pain.  I sometimes get up with pain. It bothers me the  most when I am in bed, I have a tempura pedic bed with bed position controls.  Sometimes I need to sit in a chair to help pain. I drive sometimes.I have someone coming  for PT in the home. I want to get rid of the pain in my neck    Pertinent History bil TKA, CVA in 2020, HTN, hyperlipidemia, Pre DM    How long can you sit comfortably? I can sit for 30 minutes    Currently in Pain? Yes    Pain Score 5    at worst 9/10   Pain Radiating Towards radiates down Rt arm above elbow                [facility] PT Assessment - 01/21/20 0001       Assessment   Medical Diagnosis Chronic neck pain and lumbar radiculopathy    Referring Provider (PT) Laban Emperor, Janett Billow NP    Onset Date/Surgical Date 10/21/19   approximately 3 months   Hand Dominance Right    Prior Therapy none for neck and back, but for bil TKR and CVA      Precautions   Precautions None      Restrictions   Weight Bearing Restrictions No      Balance Screen   Has the patient fallen in the past 6 months Yes    How many times? 1   tryin to reach and water something on the deck and lost bala    Has the patient had a decrease in activity level because of a fear of falling?  No    Is the patient reluctant to leave their home because of a fear of falling?  No      Home Ecologist residence    Arena to enter    Entrance Stairs-Number of Steps 2    Entrance Stairs-Rails None    Home Layout Two level      Prior Function   Level of Independence Independent    Vocation Retired      Associate Professor   Overall Cognitive Status Within Functional Limits for tasks assessed      Observation/Other Assessments   Focus on Therapeutic Outcomes (FOTO)  FOTO intake 51%, predicted 65%      Functional Tests   Functional tests Sit to Stand      Sit to Stand   Comments 5 x sts 17.72   Normal for decrease risk of fal is 13 sec      ROM / Strength   AROM / PROM / Strength AROM      AROM   Cervical Flexion 45    Cervical Extension 30    Cervical - Right Side Bend 21    Cervical - Left Side Bend 21    Cervical - Right Rotation 40    Cervical - Left Rotation 40      Strength   Overall Strength Comments grossly 4-/5      Palpation   Palpation comment tightened upper trap RT> LT                        Objective measurements completed on examination: See above findings.               PT Education - 01/21/20 1732     Education Details Pt given handout on information for TPDN, and simple neck stretches for home use  Person(s) Educated Patient    Methods Explanation;Demonstration;Handout    Comprehension Verbalized understanding;Returned demonstration              PT Short Term Goals - 01/21/20 1515       PT SHORT TERM GOAL #1   Title Pt will be independent with intial HEP    Period Weeks    Status New    Target Date 02/11/20                       Plan - 01/21/20 1733     Clinical Impression Statement Pt began evaluation and was intending to recieved TPDN  during first visit. Pt also is currently in HHPT and would prefer that venue.  Monica [name] was under the impression that she would recieve one visit for dry needling today.  PT tried to explain that dry needling is not a stand alone service and would need exercise/ therapeutic intervention as well.  Pt was also informed that insurance may not cover PT concurently across two settings( HHPT and Outpatient PT)  Team Lead PT was Carlus Pavlov was called to explain in more detail so Pt had better understanding.  Pt became resisitant to continuing Outpatient because she is more comfortable staying at home due to transportation and she is working on staying in her home alone, so pt decided not to continue in outpatient venue. Pt will not be charged due misunderstanding that dry needling is not a one visit occurrence and Monica [name] prefers to remain with HHPT at this time             Patient will benefit from skilled therapeutic intervention in order to improve the following deficits and impairments:     Visit Diagnosis: Cervicalgia  Muscle weakness (generalized)     Problem List Patient Active Problem List   Diagnosis Date Noted   Grief 01/13/2020   Recurrent falls 01/13/2020   Cervicalgia 01/13/2020   Bilateral shoulder pain 01/13/2020   Kidney cysts 01/13/2020   Abdominal aortic aneurysm (AAA) without rupture ([place]) 01/13/2020   Chronic left shoulder pain 06/07/2019   Pelvic floor dysfunction 06/06/2019   Memory loss 03/06/2019   Epistaxis 01/07/2019   Iron deficiency anemia 01/03/2019   Generalized abdominal pain 01/03/2019   Spinal stenosis of lumbar region    Vitamin D deficiency 12/26/2018   Leg DVT (deep venous thromboembolism), acute, bilateral ([place]) 12/26/2018   Chronic back pain 12/26/2018   Stroke (cerebrum) ([place]) 09/01/2018   Middle cerebral artery embolism, 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   Degenerative lumbar spinal stenosis 09/20/2017   Lumbar radiculopathy 07/31/2017   Lower GI bleed  10/03/2016   Diverticulosis of colon with hemorrhage    Status post bilateral knee replacements 05/04/2016   Chronic cough 04/15/2016   Seborrheic keratoses 04/15/2016   Cervical disc disorder with radiculopathy of cervical region 12/01/2015   Partial nontraumatic rupture of right rotator cuff 09/22/2015   Compulsive overeating 01/01/2015   Chronic diastolic heart failure ([place]) 12/12/2012    Class: Chronic   Urge incontinence of urine 09/09/2011   Knee osteoarthritis 02/09/2010   Gout 03/20/2008   Hyperlipidemia 12/21/2006   Depression with anxiety 12/21/2006   Essential hypertension 12/21/2006   Obstructive sleep apnea 12/21/2006   Voncille Lo, PT, [place] Certified Exercise Expert for the Aging Adult  01/21/20 5:50 PM Phone: [phone] Fax: [facility] [address] [place], [place], [address] Phone: [phone]   Fax:  [phone]  Name: Monica [name] MRN: [id number] Date of Birth: 05-Sep-1935  PHYSICAL THERAPY DISCHARGE SUMMARY  Visits from Start of Care: 1  Current functional level related to goals / functional outcomes: unknown   Remaining deficits: unknown   Education / Equipment: Iniital HEP   Patient agrees to discharge. Patient goals were not met. Patient is being discharged due to not returning since the last visit.  Pt never returned for subsequent visits to complete goals  Voncille Lo, PT, [facility] Certified Exercise Expert for the Aging Adult  12/29/20 1:25 PM Phone: [phone] Fax: [phone]

## 2020-01-21 NOTE — Patient Instructions (Addendum)
Trigger Point Dry Needling  . What is Trigger Point Dry Needling (DN)? o DN is a physical therapy technique used to treat muscle pain and dysfunction. Specifically, DN helps deactivate muscle trigger points (muscle knots).  o A thin filiform needle is used to penetrate the skin and stimulate the underlying trigger point. The goal is for a local twitch response (LTR) to occur and for the trigger point to relax. No medication of any kind is injected during the procedure.   . What Does Trigger Point Dry Needling Feel Like?  o The procedure feels different for each individual patient. Some patients report that they do not actually feel the needle enter the skin and overall the process is not painful. Very mild bleeding may occur. However, many patients feel a deep cramping in the muscle in which the needle was inserted. This is the local twitch response.   Marland Kitchen How Will I feel after the treatment? o Soreness is normal, and the onset of soreness may not occur for a few hours. Typically this soreness does not last longer than two days.  o Bruising is uncommon, however; ice can be used to decrease any possible bruising.  o In rare cases feeling tired or nauseous after the treatment is normal. In addition, your symptoms may get worse before they get better, this period will typically not last longer than 24 hours.   . What Can I do After My Treatment? o Increase your hydration by drinking more water for the next 24 hours. o You may place ice or heat on the areas treated that have become sore, however, do not use heat on inflamed or bruised areas. Heat often brings more relief post needling. o You can continue your regular activities, but vigorous activity is not recommended initially after the treatment for 24 hours. o DN is best combined with other physical therapy such as strengthening, stretching, and other therapies.   Levator Stretch   Grasp seat or sit on hand on side to be stretched. Turn head  toward other side and look down. Use hand on head to gently stretch neck in that position. Hold _30___ seconds. Repeat on other side. Repeat __3__ times. Do _2-3__ sessions per day.  http://gt2.exer.us/30   Copyright  VHI. All rights reserved.  Side-Bending   One hand on opposite side of head, pull head to side as far as is comfortable. Stop if there is pain. Hold _30___ seconds. Repeat with other hand to other side. Repeat _2-3___ times. Do __2-3__ sessions per day.   Copyright  VHI. All rights reserved.  Scapular Retraction (Standing)   With arms at sides, pinch shoulder blades together. Repeat ____ times per set. Do _1___ sets per session. Do _2___ sessions per day.  http://orth.exer.XT/062        Voncille Lo, PT, [place] Certified Exercise Expert for the Aging Adult  01/21/20 3:40 PM Phone: [phone] Fax: [phone]

## 2020-01-23 ENCOUNTER — Other Ambulatory Visit: Payer: Self-pay | Admitting: Interventional Cardiology

## 2020-01-24 DIAGNOSIS — E785 Hyperlipidemia, unspecified: Secondary | ICD-10-CM | POA: Diagnosis not present

## 2020-01-24 DIAGNOSIS — M5010 Cervical disc disorder with radiculopathy, unspecified cervical region: Secondary | ICD-10-CM | POA: Diagnosis not present

## 2020-01-24 DIAGNOSIS — I110 Hypertensive heart disease with heart failure: Secondary | ICD-10-CM | POA: Diagnosis not present

## 2020-01-24 DIAGNOSIS — I69351 Hemiplegia and hemiparesis following cerebral infarction affecting right dominant side: Secondary | ICD-10-CM | POA: Diagnosis not present

## 2020-01-24 DIAGNOSIS — M48061 Spinal stenosis, lumbar region without neurogenic claudication: Secondary | ICD-10-CM | POA: Diagnosis not present

## 2020-01-24 DIAGNOSIS — M109 Gout, unspecified: Secondary | ICD-10-CM | POA: Diagnosis not present

## 2020-01-24 DIAGNOSIS — G8929 Other chronic pain: Secondary | ICD-10-CM | POA: Diagnosis not present

## 2020-01-24 DIAGNOSIS — F418 Other specified anxiety disorders: Secondary | ICD-10-CM | POA: Diagnosis not present

## 2020-01-24 DIAGNOSIS — I5032 Chronic diastolic (congestive) heart failure: Secondary | ICD-10-CM | POA: Diagnosis not present

## 2020-01-27 DIAGNOSIS — G8929 Other chronic pain: Secondary | ICD-10-CM | POA: Diagnosis not present

## 2020-01-27 DIAGNOSIS — M109 Gout, unspecified: Secondary | ICD-10-CM | POA: Diagnosis not present

## 2020-01-27 DIAGNOSIS — M5010 Cervical disc disorder with radiculopathy, unspecified cervical region: Secondary | ICD-10-CM | POA: Diagnosis not present

## 2020-01-27 DIAGNOSIS — M48061 Spinal stenosis, lumbar region without neurogenic claudication: Secondary | ICD-10-CM | POA: Diagnosis not present

## 2020-01-27 DIAGNOSIS — I5032 Chronic diastolic (congestive) heart failure: Secondary | ICD-10-CM | POA: Diagnosis not present

## 2020-01-27 DIAGNOSIS — I110 Hypertensive heart disease with heart failure: Secondary | ICD-10-CM | POA: Diagnosis not present

## 2020-01-27 DIAGNOSIS — I69351 Hemiplegia and hemiparesis following cerebral infarction affecting right dominant side: Secondary | ICD-10-CM | POA: Diagnosis not present

## 2020-01-27 DIAGNOSIS — E785 Hyperlipidemia, unspecified: Secondary | ICD-10-CM | POA: Diagnosis not present

## 2020-01-27 DIAGNOSIS — F418 Other specified anxiety disorders: Secondary | ICD-10-CM | POA: Diagnosis not present

## 2020-01-29 DIAGNOSIS — M48061 Spinal stenosis, lumbar region without neurogenic claudication: Secondary | ICD-10-CM | POA: Diagnosis not present

## 2020-01-29 DIAGNOSIS — G8929 Other chronic pain: Secondary | ICD-10-CM | POA: Diagnosis not present

## 2020-01-29 DIAGNOSIS — F418 Other specified anxiety disorders: Secondary | ICD-10-CM | POA: Diagnosis not present

## 2020-01-29 DIAGNOSIS — M109 Gout, unspecified: Secondary | ICD-10-CM | POA: Diagnosis not present

## 2020-01-29 DIAGNOSIS — I5032 Chronic diastolic (congestive) heart failure: Secondary | ICD-10-CM | POA: Diagnosis not present

## 2020-01-29 DIAGNOSIS — M5010 Cervical disc disorder with radiculopathy, unspecified cervical region: Secondary | ICD-10-CM | POA: Diagnosis not present

## 2020-01-29 DIAGNOSIS — E785 Hyperlipidemia, unspecified: Secondary | ICD-10-CM | POA: Diagnosis not present

## 2020-01-29 DIAGNOSIS — I69351 Hemiplegia and hemiparesis following cerebral infarction affecting right dominant side: Secondary | ICD-10-CM | POA: Diagnosis not present

## 2020-01-29 DIAGNOSIS — I110 Hypertensive heart disease with heart failure: Secondary | ICD-10-CM | POA: Diagnosis not present

## 2020-01-30 DIAGNOSIS — E785 Hyperlipidemia, unspecified: Secondary | ICD-10-CM | POA: Diagnosis not present

## 2020-01-30 DIAGNOSIS — M48061 Spinal stenosis, lumbar region without neurogenic claudication: Secondary | ICD-10-CM | POA: Diagnosis not present

## 2020-01-30 DIAGNOSIS — M5010 Cervical disc disorder with radiculopathy, unspecified cervical region: Secondary | ICD-10-CM | POA: Diagnosis not present

## 2020-01-30 DIAGNOSIS — I5032 Chronic diastolic (congestive) heart failure: Secondary | ICD-10-CM | POA: Diagnosis not present

## 2020-01-30 DIAGNOSIS — G8929 Other chronic pain: Secondary | ICD-10-CM | POA: Diagnosis not present

## 2020-01-30 DIAGNOSIS — I69351 Hemiplegia and hemiparesis following cerebral infarction affecting right dominant side: Secondary | ICD-10-CM | POA: Diagnosis not present

## 2020-01-30 DIAGNOSIS — M109 Gout, unspecified: Secondary | ICD-10-CM | POA: Diagnosis not present

## 2020-01-30 DIAGNOSIS — I110 Hypertensive heart disease with heart failure: Secondary | ICD-10-CM | POA: Diagnosis not present

## 2020-01-30 DIAGNOSIS — F418 Other specified anxiety disorders: Secondary | ICD-10-CM | POA: Diagnosis not present

## 2020-01-31 DIAGNOSIS — M48061 Spinal stenosis, lumbar region without neurogenic claudication: Secondary | ICD-10-CM | POA: Diagnosis not present

## 2020-01-31 DIAGNOSIS — M109 Gout, unspecified: Secondary | ICD-10-CM | POA: Diagnosis not present

## 2020-01-31 DIAGNOSIS — G8929 Other chronic pain: Secondary | ICD-10-CM | POA: Diagnosis not present

## 2020-01-31 DIAGNOSIS — I110 Hypertensive heart disease with heart failure: Secondary | ICD-10-CM | POA: Diagnosis not present

## 2020-01-31 DIAGNOSIS — I5032 Chronic diastolic (congestive) heart failure: Secondary | ICD-10-CM | POA: Diagnosis not present

## 2020-01-31 DIAGNOSIS — M5010 Cervical disc disorder with radiculopathy, unspecified cervical region: Secondary | ICD-10-CM | POA: Diagnosis not present

## 2020-01-31 DIAGNOSIS — F418 Other specified anxiety disorders: Secondary | ICD-10-CM | POA: Diagnosis not present

## 2020-01-31 DIAGNOSIS — I69351 Hemiplegia and hemiparesis following cerebral infarction affecting right dominant side: Secondary | ICD-10-CM | POA: Diagnosis not present

## 2020-01-31 DIAGNOSIS — E785 Hyperlipidemia, unspecified: Secondary | ICD-10-CM | POA: Diagnosis not present

## 2020-02-05 DIAGNOSIS — G8929 Other chronic pain: Secondary | ICD-10-CM | POA: Diagnosis not present

## 2020-02-05 DIAGNOSIS — F418 Other specified anxiety disorders: Secondary | ICD-10-CM | POA: Diagnosis not present

## 2020-02-05 DIAGNOSIS — M109 Gout, unspecified: Secondary | ICD-10-CM | POA: Diagnosis not present

## 2020-02-05 DIAGNOSIS — M48061 Spinal stenosis, lumbar region without neurogenic claudication: Secondary | ICD-10-CM | POA: Diagnosis not present

## 2020-02-05 DIAGNOSIS — E785 Hyperlipidemia, unspecified: Secondary | ICD-10-CM | POA: Diagnosis not present

## 2020-02-05 DIAGNOSIS — M5010 Cervical disc disorder with radiculopathy, unspecified cervical region: Secondary | ICD-10-CM | POA: Diagnosis not present

## 2020-02-05 DIAGNOSIS — I5032 Chronic diastolic (congestive) heart failure: Secondary | ICD-10-CM | POA: Diagnosis not present

## 2020-02-05 DIAGNOSIS — I69351 Hemiplegia and hemiparesis following cerebral infarction affecting right dominant side: Secondary | ICD-10-CM | POA: Diagnosis not present

## 2020-02-05 DIAGNOSIS — I110 Hypertensive heart disease with heart failure: Secondary | ICD-10-CM | POA: Diagnosis not present

## 2020-02-13 DIAGNOSIS — F418 Other specified anxiety disorders: Secondary | ICD-10-CM | POA: Diagnosis not present

## 2020-02-13 DIAGNOSIS — E785 Hyperlipidemia, unspecified: Secondary | ICD-10-CM | POA: Diagnosis not present

## 2020-02-13 DIAGNOSIS — M5010 Cervical disc disorder with radiculopathy, unspecified cervical region: Secondary | ICD-10-CM | POA: Diagnosis not present

## 2020-02-13 DIAGNOSIS — I69351 Hemiplegia and hemiparesis following cerebral infarction affecting right dominant side: Secondary | ICD-10-CM | POA: Diagnosis not present

## 2020-02-13 DIAGNOSIS — I110 Hypertensive heart disease with heart failure: Secondary | ICD-10-CM | POA: Diagnosis not present

## 2020-02-13 DIAGNOSIS — M48061 Spinal stenosis, lumbar region without neurogenic claudication: Secondary | ICD-10-CM | POA: Diagnosis not present

## 2020-02-13 DIAGNOSIS — G8929 Other chronic pain: Secondary | ICD-10-CM | POA: Diagnosis not present

## 2020-02-13 DIAGNOSIS — I5032 Chronic diastolic (congestive) heart failure: Secondary | ICD-10-CM | POA: Diagnosis not present

## 2020-02-13 DIAGNOSIS — M109 Gout, unspecified: Secondary | ICD-10-CM | POA: Diagnosis not present

## 2020-02-16 ENCOUNTER — Other Ambulatory Visit: Payer: Self-pay | Admitting: Interventional Cardiology

## 2020-02-27 ENCOUNTER — Telehealth: Payer: Self-pay | Admitting: Pharmacist

## 2020-02-27 MED ORDER — APIXABAN 5 MG PO TABS
5.0000 mg | ORAL_TABLET | Freq: Two times a day (BID) | ORAL | 1 refills | Status: DC
Start: 2020-02-27 — End: 2020-10-12

## 2020-02-27 NOTE — Telephone Encounter (Signed)
Eliquis prior authorization approved through 02/26/21. Called pt and advised her, she was appreciative for assistance.

## 2020-03-03 ENCOUNTER — Encounter: Payer: Self-pay | Admitting: Internal Medicine

## 2020-03-12 DIAGNOSIS — I110 Hypertensive heart disease with heart failure: Secondary | ICD-10-CM | POA: Diagnosis not present

## 2020-03-12 DIAGNOSIS — M48061 Spinal stenosis, lumbar region without neurogenic claudication: Secondary | ICD-10-CM | POA: Diagnosis not present

## 2020-03-12 DIAGNOSIS — G8929 Other chronic pain: Secondary | ICD-10-CM | POA: Diagnosis not present

## 2020-03-12 DIAGNOSIS — I69351 Hemiplegia and hemiparesis following cerebral infarction affecting right dominant side: Secondary | ICD-10-CM | POA: Diagnosis not present

## 2020-03-12 DIAGNOSIS — E785 Hyperlipidemia, unspecified: Secondary | ICD-10-CM | POA: Diagnosis not present

## 2020-03-12 DIAGNOSIS — M5010 Cervical disc disorder with radiculopathy, unspecified cervical region: Secondary | ICD-10-CM | POA: Diagnosis not present

## 2020-03-12 DIAGNOSIS — M109 Gout, unspecified: Secondary | ICD-10-CM | POA: Diagnosis not present

## 2020-03-12 DIAGNOSIS — F418 Other specified anxiety disorders: Secondary | ICD-10-CM | POA: Diagnosis not present

## 2020-03-12 DIAGNOSIS — I5032 Chronic diastolic (congestive) heart failure: Secondary | ICD-10-CM | POA: Diagnosis not present

## 2020-03-17 DIAGNOSIS — E785 Hyperlipidemia, unspecified: Secondary | ICD-10-CM | POA: Diagnosis not present

## 2020-03-17 DIAGNOSIS — I5032 Chronic diastolic (congestive) heart failure: Secondary | ICD-10-CM | POA: Diagnosis not present

## 2020-03-17 DIAGNOSIS — G8929 Other chronic pain: Secondary | ICD-10-CM | POA: Diagnosis not present

## 2020-03-17 DIAGNOSIS — I69351 Hemiplegia and hemiparesis following cerebral infarction affecting right dominant side: Secondary | ICD-10-CM | POA: Diagnosis not present

## 2020-03-17 DIAGNOSIS — M5010 Cervical disc disorder with radiculopathy, unspecified cervical region: Secondary | ICD-10-CM | POA: Diagnosis not present

## 2020-03-17 DIAGNOSIS — F418 Other specified anxiety disorders: Secondary | ICD-10-CM | POA: Diagnosis not present

## 2020-03-17 DIAGNOSIS — I110 Hypertensive heart disease with heart failure: Secondary | ICD-10-CM | POA: Diagnosis not present

## 2020-03-17 DIAGNOSIS — M48061 Spinal stenosis, lumbar region without neurogenic claudication: Secondary | ICD-10-CM | POA: Diagnosis not present

## 2020-03-17 DIAGNOSIS — M109 Gout, unspecified: Secondary | ICD-10-CM | POA: Diagnosis not present

## 2020-03-19 DIAGNOSIS — I69351 Hemiplegia and hemiparesis following cerebral infarction affecting right dominant side: Secondary | ICD-10-CM | POA: Diagnosis not present

## 2020-03-19 DIAGNOSIS — F418 Other specified anxiety disorders: Secondary | ICD-10-CM | POA: Diagnosis not present

## 2020-03-19 DIAGNOSIS — M5010 Cervical disc disorder with radiculopathy, unspecified cervical region: Secondary | ICD-10-CM | POA: Diagnosis not present

## 2020-03-19 DIAGNOSIS — I5032 Chronic diastolic (congestive) heart failure: Secondary | ICD-10-CM | POA: Diagnosis not present

## 2020-03-19 DIAGNOSIS — G8929 Other chronic pain: Secondary | ICD-10-CM | POA: Diagnosis not present

## 2020-03-19 DIAGNOSIS — E785 Hyperlipidemia, unspecified: Secondary | ICD-10-CM | POA: Diagnosis not present

## 2020-03-19 DIAGNOSIS — M48061 Spinal stenosis, lumbar region without neurogenic claudication: Secondary | ICD-10-CM | POA: Diagnosis not present

## 2020-03-19 DIAGNOSIS — I110 Hypertensive heart disease with heart failure: Secondary | ICD-10-CM | POA: Diagnosis not present

## 2020-03-19 DIAGNOSIS — M109 Gout, unspecified: Secondary | ICD-10-CM | POA: Diagnosis not present

## 2020-03-20 ENCOUNTER — Other Ambulatory Visit: Payer: Self-pay | Admitting: Interventional Cardiology

## 2020-03-22 ENCOUNTER — Other Ambulatory Visit: Payer: Self-pay | Admitting: Interventional Cardiology

## 2020-04-01 ENCOUNTER — Ambulatory Visit (INDEPENDENT_AMBULATORY_CARE_PROVIDER_SITE_OTHER): Payer: Medicare HMO

## 2020-04-01 VITALS — Ht 67.0 in | Wt 251.0 lb

## 2020-04-01 DIAGNOSIS — Z0000 Encounter for general adult medical examination without abnormal findings: Secondary | ICD-10-CM | POA: Diagnosis not present

## 2020-04-01 NOTE — Progress Notes (Signed)
Subjective:   Monica [name] is a 85 y.o. female who presents for Medicare Annual (Subsequent) preventive examination.  Review of Systems    No ROS.  Medicare Wellness Virtual Visit.   Cardiac Risk Factors include: advanced age (>47men, >2 women);hypertension     Objective:    Today's Vitals   04/01/20 1335  Weight: 251 lb (113.9 kg)  Height: 5\' 7"  (1.702 m)   Body mass index is 39.31 kg/m.  Advanced Directives 04/01/2020 01/21/2020 01/02/2019 09/01/2018 12/12/2016 10/02/2016 09/30/2016  Does Patient Have a Medical Advance Directive? No Yes No No Yes No No  Type of Advance Directive - [place];Living will - - [place];Living will - -  Does patient want to make changes to medical advance directive? - No - Patient declined - - - - -  Copy of [place] in Chart? - No - copy requested - - - - -  Would patient like information on creating a medical advance directive? - - No - Patient declined No - Patient declined - No - Patient declined No - Patient declined    Current Medications (verified) Outpatient Encounter Medications as of 04/01/2020  Medication Sig  . allopurinol (ZYLOPRIM) 100 MG tablet TAKE 2 TABLETS BY MOUTH ONCE A DAY  . ALPRAZolam (XANAX) 0.25 MG tablet Take 1 tablet (0.25 mg total) by mouth 2 (two) times daily as needed for anxiety.  Marland Kitchen amLODipine (NORVASC) 5 MG tablet Take 1 tablet (5 mg total) by mouth daily. Take additional 5 mg in pm if BP>130/>80  . apixaban (ELIQUIS) 5 MG TABS tablet Take 1 tablet (5 mg total) by mouth 2 (two) times daily.  . Ascorbic Acid (VITAMIN C PO) Take by mouth.  Marland Kitchen atorvastatin (LIPITOR) 20 MG tablet Take 1 tablet (20 mg total) by mouth daily. At night. STOP/discontinue Lovastatin 20  . Cholecalciferol (VITAMIN D3 PO) Take by mouth.  . colchicine 0.6 MG tablet Take 1 tablet (0.6 mg total) by mouth daily.  . diclofenac Sodium (VOLTAREN) 1 % GEL Apply 2 g topically 4 (four) times  daily as needed.  . DULoxetine (CYMBALTA) 30 MG capsule Take 1 capsule (30 mg total) by mouth daily.  . furosemide (LASIX) 40 MG tablet Take 1 tablet (40 mg total) by mouth daily.  Marland Kitchen gabapentin (NEURONTIN) 300 MG capsule Take 1 capsule (300 mg total) by mouth 3 (three) times daily as needed.  Marland Kitchen HYDROcodone-acetaminophen (NORCO/VICODIN) 5-325 MG tablet Take 1 tablet by mouth 2 (two) times daily as needed for moderate pain.  Marland Kitchen KLOR-CON M20 20 MEQ tablet TAKE 1 TABLET BY MOUTH DAILY. PLEASE SCHEDULE APPOINTMENT FOR FUTURE REFILLS. THANK YOU  . Magnesium 250 MG TABS Take 250 mg by mouth daily.  . metoprolol succinate (TOPROL-XL) 100 MG 24 hr tablet TAKE 1 TABLET BY MOUTH DAILY WITH OR IMMEDIATLY FOLLOWING A MEAL  . Multiple Vitamins-Minerals (MULTIVITAMIN WOMEN 50+ PO) Take by mouth.  . potassium chloride SA (KLOR-CON) 10 MEQ tablet Take 1 tablet (10 mEq total) by mouth daily.  Marland Kitchen spironolactone (ALDACTONE) 25 MG tablet Take 0.5 tablets (12.5 mg total) by mouth daily. In the am Please make yearly appt with Dr. Tamala Julian for December for future refills. Thank you   No facility-administered encounter medications on file as of 04/01/2020.    Allergies (verified) Buspirone hcl, Lisinopril, and Prozac [fluoxetine hcl]   History: Past Medical History:  Diagnosis Date  . Allergic rhinitis   . Anxiety   . Benign  neoplasm of breast 2013  . Cardiac arrhythmia   . Cardiomegaly   . Cataract   . CHF (congestive heart failure) ([place])   . Compulsive eating patterns   . Compulsive overeating   . Depression   . Diastolic heart failure (HCC)    LVH with free wall thickness 1.4 cm EF 60% Dr. Tamala Julian   . Diffuse cystic mastopathy 2013  . Edema   . Epistaxis    noted 12/20/18   . GI bleeding    in ~2018   . Gout   . Hiatal hernia   . HTN (hypertension)   . Hyperlipidemia   . Lump or mass in breast 2012  . OA (osteoarthritis) of knee    with injections  . Obesity   . OSA (obstructive sleep apnea)    not  on cpap  . Other abnormal glucose   . Peptic stricture of esophagus   . Personal history of tobacco use, presenting hazards to health   . Sciatica   . Sinus infection 2010  . Spinal stenosis    lumbar   . Stroke ([place])   . Unspecified sleep apnea    Past Surgical History:  Procedure Laterality Date  . adenosine cardiolite  1/08   low risk   . APPENDECTOMY    . BREAST BIOPSY     02/28/11 negative  . BREAST LUMPECTOMY     right x1 ('89) left x2 ('70s, '90)  . CARDIAC CATHETERIZATION  2001   normal per pt  . COLONOSCOPY  11/02   diverticulosis  . COLONOSCOPY  9/06   diverticulosis, hemorhoids  . COLONOSCOPY N/A 10/01/2016   Procedure: COLONOSCOPY;  Surgeon: Gatha Mayer, MD;  Location: [facility];  Service: Endoscopy;  Laterality: N/A;  . dexa  3/02   normal  . EYE SURGERY  2012   cataract  . IR CT HEAD LTD  09/01/2018  . IR PERCUTANEOUS ART THROMBECTOMY/INFUSION INTRACRANIAL INC DIAG ANGIO  09/01/2018  . JOINT REPLACEMENT     knee b/l 2011  . knee replacement Right 9/11   R. Dr. Telford Nab  . RADIOLOGY WITH ANESTHESIA N/A 09/01/2018   Procedure: IR WITH ANESTHESIA;  Surgeon: Luanne Bras, MD;  Location: [place];  Service: Radiology;  Laterality: N/A;  . sleep study  5/05  . TONSILLECTOMY    . TOTAL ABDOMINAL HYSTERECTOMY     fibroids, age of 87  . TOTAL KNEE ARTHROPLASTY Left 2012  . US TRANSVAGINAL PELVIC MODIFIED  2000, 2001   Family History  Problem Relation Age of Onset  . Stroke Father   . Hypertension Mother        (a lot of animosity in their relationship)  . Heart failure Mother   . Gout Other        whole family   . Prostate cancer Brother   . Cancer Brother        prostate   . Hepatitis Brother        Hep C; alcoholism (terminal)  . Drug abuse Brother        died 63  . Breast cancer Sister   . Cancer Sister        breast cancer  . Other Brother        drug addiction   Social History   Socioeconomic History  . Marital status: Widowed     Spouse name: Not on file  . Number of children: 1  . Years of education: Not on file  . Highest  education level: Not on file  Occupational History  . Occupation: Retired Ship broker: RETIRED  Tobacco Use  . Smoking status: Former Smoker    Packs/day: 0.50    Years: 20.00    Pack years: 10.00    Types: Cigarettes  . Smokeless tobacco: Never Used  . Tobacco comment: quit approx. 20 years ago  Vaping Use  . Vaping Use: Never used  Substance and Sexual Activity  . Alcohol use: Yes    Alcohol/week: 0.0 standard drinks    Comment: wine occasional  . Drug use: No  . Sexual activity: Never  Other Topics Concern  . Not on file  Social History Narrative   Widowed x 30+ years as of 12/2018    1 daughter. Retired principal. 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 Difficulty of Paying Living Expenses: Not hard at all  Food Insecurity: No Food Insecurity  . Worried About Charity fundraiser in the Last Year: Never true  . Ran Out of Food in the Last Year: Never true  Transportation Needs: No Transportation Needs  . Lack of Transportation (Medical): No  . Lack of Transportation (Non-Medical): No  Physical Activity: Not on file  Stress: No Stress Concern Present  . Feeling of Stress : Not at all  Social Connections: Unknown  . Frequency of Communication with Friends and Family: More than three times a week  . Frequency of Social Gatherings with Friends and Family: Not on file  . Attends Religious Services: Not on file  . Active Member of Clubs or Organizations: Not on file  . Attends Archivist Meetings: Not on file  . Marital Status: Not on file    Tobacco Counseling Counseling given: Not Answered Comment: quit approx. 20 years ago   Clinical Intake:  Pre-visit preparation completed: Yes        Diabetes: No  How often do you  need to have someone help you when you read instructions, pamphlets, or other written materials from your doctor or pharmacy?: 1 - Never Interpreter Needed?: No      Activities of Daily Living In your present state of health, do you have any difficulty performing the following activities: 04/01/2020  Hearing? Y  Vision? N  Difficulty concentrating or making decisions? N  Walking or climbing stairs? Y  Comment Paces self.  Dressing or bathing? N  Doing errands, shopping? N  Preparing Food and eating ? N  Using the Toilet? N  In the past six months, have you accidently leaked urine? N  Do you have problems with loss of bowel control? N  Managing your Medications? N  Managing your Finances? N  Housekeeping or managing your Housekeeping? N  Some recent data might be hidden    Patient Care Team: McLean-Scocuzza, Nino Glow, MD as PCP - General (Internal Medicine) Belva Crome, MD as PCP - Cardiology (Cardiology) Christene Lye, MD (General Surgery) Tower, Wynelle Fanny, MD as Consulting Physician (Family Medicine)  Indicate any recent Medical Services you may have received from other than [facility] providers in the past year (date may be approximate).     Assessment:   This is a routine wellness examination for Monica [name].  I connected with Monica [name] today by telephone and verified that I am speaking with the correct person using two identifiers. Location  patient: home Location provider: work Persons participating in the virtual visit: patient, Marine scientist.    I discussed the limitations, risks, security and privacy concerns of performing an evaluation and management service by telephone and the availability of in person appointments. The patient expressed understanding and verbally consented to this telephonic visit.    Interactive audio and video telecommunications were attempted between this provider and patient, however failed, due to patient having technical difficulties OR patient did not  have access to video capability.  We continued and completed visit with audio only.  Some vital signs may be absent or patient reported.   Hearing/Vision screen  Hearing Screening   125Hz  250Hz  500Hz  1000Hz  2000Hz  3000Hz  4000Hz  6000Hz  8000Hz   Right ear:           Left ear:           Comments: Patient is able to hear conversational tones without difficulty.  No issues reported.   Vision Screening Comments: Wears corrective lenses Visual acuity not assessed, virtual visit.  They have seen their ophthalmologist in the last 12 months.     Dietary issues and exercise activities discussed: Current Exercise Habits: Home exercise routine, Intensity: Mild  Healthy diet Good water intake  Goals    . Maintain healthy lifestyle     Stay active Healthy diet      Depression Screen PHQ 2/9 Scores 04/01/2020 01/08/2020 06/19/2019 06/06/2019 03/06/2019 12/26/2018 10/29/2018  PHQ - 2 Score 0 0 0 0 0 0 3  PHQ- 9 Score - 0 3 - - - 8    Fall Risk Fall Risk  04/01/2020 01/08/2020 06/19/2019 06/06/2019 03/06/2019  Falls in the past year? 0 0 0 0 0  Number falls in past yr: 0 0 - 0 -  Injury with Fall? 0 0 - 0 -  Risk for fall due to : - - - Impaired balance/gait;Impaired mobility -  Follow up Falls evaluation completed Falls evaluation completed Falls evaluation completed Falls evaluation completed -    FALL RISK PREVENTION PERTAINING TO THE HOME: Handrails in use when climbing stairs? Yes Home free of loose throw rugs in walkways, pet beds, electrical cords, etc? Yes  Adequate lighting in your home to reduce risk of falls? Yes   ASSISTIVE DEVICES UTILIZED TO PREVENT FALLS: Life alert? Yes  Use of a cane, walker or w/c? Yes , walker as needed.  TIMED UP AND GO: Was the test performed? No . Virtual visit.   Cognitive Function:  Patient is alert and oriented x3. Denies difficulty focusing,making decisions, memory loss.  Enjoys reading, grocery shop, cooking new recipes and writing for brain  health.  MMSE/6CIT deferred. Normal by direct communication/observation.      Immunizations Immunization History  Administered Date(s) Administered  . Fluad Quad(high Dose 65+) 12/05/2018, 12/25/2019  . Influenza Split 12/01/2010, 01/10/2012  . Influenza, High Dose Seasonal PF 12/03/2015  . Influenza,inj,Quad PF,6+ Mos 11/27/2012, 12/03/2013, 12/31/2014  . PFIZER(Purple Top)SARS-COV-2 Vaccination 03/28/2019, 04/18/2019, 12/25/2019  . Pneumococcal Conjugate-13 02/11/2015  . Pneumococcal Polysaccharide-23 08/01/1996, 12/03/2013  . Td 04/20/1995   Health Maintenance There are no preventive care reminders to display for this patient. Health Maintenance  Topic Date Due  . MAMMOGRAM  04/04/2020  . COVID-19 Vaccine (4 - Booster for Pfizer series) 06/23/2020  . TETANUS/TDAP  12/20/2026  . INFLUENZA VACCINE  Completed  . DEXA SCAN  Completed  . PNA vac Low Risk Adult  Completed   Colonoscopy 2018.   Mammogram status: Completed 04/14/19. Repeat every year.  Plans to schedule in one week.   Bone density- 03/05/15. Cholecalciferol (VITAMIN D3 PO).  Lung Cancer Screening: (Low Dose CT Chest recommended if Age 80-80 years, 30 pack-year currently smoking OR have quit w/in 15years.) does not qualify.   Hepatitis C Screening: does not qualify.  Medical needling- patient reports she did not do this per her preference.   Vision Screening: Recommended annual ophthalmology exams for early detection of glaucoma and other disorders of the eye. Is the patient up to date with their annual eye exam?  Yes   Dental Screening: Recommended annual dental exams for proper oral hygiene. Visits every 6 months.   Community Resource Referral / Chronic Care Management: CRR required this visit?  No   CCM required this visit?  No      Plan:   Keep all routine maintenance appointments.   Next scheduled lab 08/05/20 @ 10:30  Follow up 08/07/20 @ 3:30  I have personally reviewed and noted the following in  the patient's chart:   . Medical and social history . Use of alcohol, tobacco or illicit drugs  . Current medications and supplements . Functional ability and status . Nutritional status . Physical activity . Advanced directives . List of other physicians . Hospitalizations, surgeries, and ER visits in previous 12 months . Vitals . Screenings to include cognitive, depression, and falls . Referrals and appointments  In addition, I have reviewed and discussed with patient certain preventive protocols, quality metrics, and best practice recommendations. A written personalized care plan for preventive services as well as general preventive health recommendations were provided to patient via mychart.     Varney Biles, LPN   02/26/7260

## 2020-04-01 NOTE — Patient Instructions (Addendum)
Monica [name] , Thank you for taking time to come for your Medicare Wellness Visit. I appreciate your ongoing commitment to your health goals. Please review the following plan we discussed and let me know if I can assist you in the future.   These are the goals we discussed: Goals    . Maintain healthy lifestyle     Stay active Healthy diet       This is a list of the screening recommended for you and due dates:  Health Maintenance  Topic Date Due  . Mammogram  04/04/2020  . COVID-19 Vaccine (4 - Booster for Pfizer series) 06/23/2020  . Tetanus Vaccine  12/20/2026  . Flu Shot  Completed  . DEXA scan (bone density measurement)  Completed  . Pneumonia vaccines  Completed   Immunizations Immunization History  Administered Date(s) Administered  . Fluad Quad(high Dose 65+) 12/05/2018, 12/25/2019  . Influenza Split 12/01/2010, 01/10/2012  . Influenza, High Dose Seasonal PF 12/03/2015  . Influenza,inj,Quad PF,6+ Mos 11/27/2012, 12/03/2013, 12/31/2014  . PFIZER(Purple Top)SARS-COV-2 Vaccination 03/28/2019, 04/18/2019, 12/25/2019  . Pneumococcal Conjugate-13 02/11/2015  . Pneumococcal Polysaccharide-23 08/01/1996, 12/03/2013  . Td 04/20/1995   Keep all routine maintenance appointments.   Next scheduled lab 08/05/20 @ 10:30  Follow up 08/07/20 @ 3:30  Conditions/risks identified: none new  Follow up in one year for your annual wellness visit.   Preventive Care 52 Years and Older, Female Preventive care refers to lifestyle choices and visits with your health care provider that can promote health and wellness. What does preventive care include?  A yearly physical exam. This is also called an annual well check.  Dental exams once or twice a year.  Routine eye exams. Ask your health care provider how often you should have your eyes checked.  Personal lifestyle choices, including:  Daily care of your teeth and gums.  Regular physical activity.  Eating a healthy  diet.  Avoiding tobacco and drug use.  Limiting alcohol use.  Practicing safe sex.  Taking low-dose aspirin every day.  Taking vitamin and mineral supplements as recommended by your health care provider. What happens during an annual well check? The services and screenings done by your health care provider during your annual well check will depend on your age, overall health, lifestyle risk factors, and family history of disease. Counseling  Your health care provider may ask you questions about your:  Alcohol use.  Tobacco use.  Drug use.  Emotional well-being.  Home and relationship well-being.  Sexual activity.  Eating habits.  History of falls.  Memory and ability to understand (cognition).  Work and work Statistician.  Reproductive health. Screening  You may have the following tests or measurements:  Height, weight, and BMI.  Blood pressure.  Lipid and cholesterol levels. These may be checked every 5 years, or more frequently if you are over 18 years old.  Skin check.  Lung cancer screening. You may have this screening every year starting at age 36 if you have a 30-pack-year history of smoking and currently smoke or have quit within the past 15 years.  Fecal occult blood test (FOBT) of the stool. You may have this test every year starting at age 36.  Flexible sigmoidoscopy or colonoscopy. You may have a sigmoidoscopy every 5 years or a colonoscopy every 10 years starting at age 5.  Hepatitis C blood test.  Hepatitis B blood test.  Sexually transmitted disease (STD) testing.  Diabetes screening. This is done by checking your blood sugar (  glucose) after you have not eaten for a while (fasting). You may have this done every 1-3 years.  Bone density scan. This is done to screen for osteoporosis. You may have this done starting at age 20.  Mammogram. This may be done every 1-2 years. Talk to your health care provider about how often you should have  regular mammograms. Talk with your health care provider about your test results, treatment options, and if necessary, the need for more tests. Vaccines  Your health care provider may recommend certain vaccines, such as:  Influenza vaccine. This is recommended every year.  Tetanus, diphtheria, and acellular pertussis (Tdap, Td) vaccine. You may need a Td booster every 10 years.  Zoster vaccine. You may need this after age 19.  Pneumococcal 13-valent conjugate (PCV13) vaccine. One dose is recommended after age 73.  Pneumococcal polysaccharide (PPSV23) vaccine. One dose is recommended after age 99. Talk to your health care provider about which screenings and vaccines you need and how often you need them. This information is not intended to replace advice given to you by your health care provider. Make sure you discuss any questions you have with your health care provider. Document Released: 03/06/2015 Document Revised: 10/28/2015 Document Reviewed: 12/09/2014 Elsevier Interactive Patient Education  2017 [place] Prevention in the Home Falls can cause injuries. They can happen to people of all ages. There are many things you can do to make your home safe and to help prevent falls. What can I do on the outside of my home?  Regularly fix the edges of walkways and driveways and fix any cracks.  Remove anything that might make you trip as you walk through a door, such as a raised step or threshold.  Trim any bushes or trees on the path to your home.  Use bright outdoor lighting.  Clear any walking paths of anything that might make someone trip, such as rocks or tools.  Regularly check to see if handrails are loose or broken. Make sure that both sides of any steps have handrails.  Any raised decks and porches should have guardrails on the edges.  Have any leaves, snow, or ice cleared regularly.  Use sand or salt on walking paths during winter.  Clean up any spills in your  garage right away. This includes oil or grease spills. What can I do in the bathroom?  Use night lights.  Install grab bars by the toilet and in the tub and shower. Do not use towel bars as grab bars.  Use non-skid mats or decals in the tub or shower.  If you need to sit down in the shower, use a plastic, non-slip stool.  Keep the floor dry. Clean up any water that spills on the floor as soon as it happens.  Remove soap buildup in the tub or shower regularly.  Attach bath mats securely with double-sided non-slip rug tape.  Do not have throw rugs and other things on the floor that can make you trip. What can I do in the bedroom?  Use night lights.  Make sure that you have a light by your bed that is easy to reach.  Do not use any sheets or blankets that are too big for your bed. They should not hang down onto the floor.  Have a firm chair that has side arms. You can use this for support while you get dressed.  Do not have throw rugs and other things on the floor that can make  you trip. What can I do in the kitchen?  Clean up any spills right away.  Avoid walking on wet floors.  Keep items that you use a lot in easy-to-reach places.  If you need to reach something above you, use a strong step stool that has a grab bar.  Keep electrical cords out of the way.  Do not use floor polish or wax that makes floors slippery. If you must use wax, use non-skid floor wax.  Do not have throw rugs and other things on the floor that can make you trip. What can I do with my stairs?  Do not leave any items on the stairs.  Make sure that there are handrails on both sides of the stairs and use them. Fix handrails that are broken or loose. Make sure that handrails are as long as the stairways.  Check any carpeting to make sure that it is firmly attached to the stairs. Fix any carpet that is loose or worn.  Avoid having throw rugs at the top or bottom of the stairs. If you do have throw  rugs, attach them to the floor with carpet tape.  Make sure that you have a light switch at the top of the stairs and the bottom of the stairs. If you do not have them, ask someone to add them for you. What else can I do to help prevent falls?  Wear shoes that:  Do not have high heels.  Have rubber bottoms.  Are comfortable and fit you well.  Are closed at the toe. Do not wear sandals.  If you use a stepladder:  Make sure that it is fully opened. Do not climb a closed stepladder.  Make sure that both sides of the stepladder are locked into place.  Ask someone to hold it for you, if possible.  Clearly mark and make sure that you can see:  Any grab bars or handrails.  First and last steps.  Where the edge of each step is.  Use tools that help you move around (mobility aids) if they are needed. These include:  Canes.  Walkers.  Scooters.  Crutches.  Turn on the lights when you go into a dark area. Replace any light bulbs as soon as they burn out.  Set up your furniture so you have a clear path. Avoid moving your furniture around.  If any of your floors are uneven, fix them.  If there are any pets around you, be aware of where they are.  Review your medicines with your doctor. Some medicines can make you feel dizzy. This can increase your chance of falling. Ask your doctor what other things that you can do to help prevent falls. This information is not intended to replace advice given to you by your health care provider. Make sure you discuss any questions you have with your health care provider. Document Released: 12/04/2008 Document Revised: 07/16/2015 Document Reviewed: 03/14/2014 Elsevier Interactive Patient Education  2017 [employer].

## 2020-04-15 ENCOUNTER — Other Ambulatory Visit: Payer: Self-pay | Admitting: Interventional Cardiology

## 2020-04-16 NOTE — Telephone Encounter (Signed)
Ok to give 30 days with no refills and a note to make an appt.  Thanks

## 2020-04-28 ENCOUNTER — Ambulatory Visit
Admission: RE | Admit: 2020-04-28 | Discharge: 2020-04-28 | Disposition: A | Discharge status: Home / Self Care | Payer: Medicare HMO | Source: Ambulatory Visit | Attending: Internal Medicine | Admitting: Internal Medicine

## 2020-04-28 ENCOUNTER — Other Ambulatory Visit: Payer: Self-pay

## 2020-04-28 DIAGNOSIS — Z1231 Encounter for screening mammogram for malignant neoplasm of breast: Secondary | ICD-10-CM | POA: Diagnosis not present

## 2020-05-05 ENCOUNTER — Encounter: Payer: Self-pay | Admitting: Adult Health

## 2020-05-05 ENCOUNTER — Ambulatory Visit: Payer: Medicare HMO | Admitting: Adult Health

## 2020-05-05 VITALS — BP 127/75 | HR 73 | Ht 67.0 in | Wt 246.0 lb

## 2020-05-05 DIAGNOSIS — I63412 Cerebral infarction due to embolism of left middle cerebral artery: Secondary | ICD-10-CM | POA: Diagnosis not present

## 2020-05-05 NOTE — Progress Notes (Signed)
[facility] [address] [place]. [place] [address] [phone]       STROKE FOLLOW UP NOTE  [name] Date of Birth:  [birth year] Medical Record Number:  [id number]   Reason for Referral: Left MCA stroke 08/2018    CHIEF COMPLAINT:  Chief Complaint  Patient presents with   Follow-up    RM 65 With daughter (Monica [name]) Pt is well, no complaints     HPI:   Today, 05/05/2020, [name] returns for stroke follow-up  Stable from stroke standpoint without new or reoccurring stroke/TIA symptoms Compliant on Eliquis and atorvastatin -denies side effects Blood pressure today 127/75  Chronic cervical and lumbar pain currently stable - currently on gabapentin taking 600mg  nightly and duloxetin 30mg  daily managed by PCP  No further concerns at this time     History provided for reference purposes only Update 12/30/2019 JM: [name] returns for stroke follow up accompianed by her daughter.  She has been stable from a stroke standpoint without new or recurring stroke/TIA symptoms.  Remains on Eliquis and atorvastatin for secondary stroke prevention without side effects.  Blood pressure today 138/88 routinely monitors at home which has been stable. Multiple other none stroke complaints including intermittent bilateral hand numbness and heaviness sensation on the bottom of her feet bilaterally at night.  Reports symptoms present over the past few months.  Hand numbness can be in both right or left hand generalized without specific affected fingers or areas and can last for 5 to 10 minutes.  Symptoms usually present upon awakening but also at times while sitting in a chair or increased activity.  Denies any associated weakness.  Does report ongoing bilateral neck and shoulder pain with increased tightness of upper trapezius especially when looking to the right or the left.  Denies numbness in the bottom of her feet but more of a heaviness/tightness sensation and  has been using lotion/balm for very dry, flaky skin.  She continues to experience lower back pain which worsens after standing for too long.  This was previously discussed and had evaluation by neurosurgery who recommended surgical procedure but she was not interested in surgical intervention.  This pain previously managed well on gabapentin 300 mg 3 times daily but currently on 300 mg twice daily.  No further concerns at this time.  Update 06/26/2019 JM: [name] returns for stroke follow-up accompianed by her daughter.  She has been stable since prior visit without residual deficits and denies new or recurrent stroke/TIA symptoms.  Continues on Eliquis with history of atrial fibrillation and secondary stroke prevention without side effects. Blood pressure today 152/82.  No neurological concerns at this time.  Update 12/26/2018: [name] is an 85 year old female who is being seen today for stroke follow-up.  She has been stable from a stroke standpoint without new or reoccurring stroke/TIA symptoms.  Continues on Eliquis without bleeding or bruising.  Ongoing follow-up with cardiology regularly.  At prior visit, patient's main concern was low back pain with radiculopathy limiting functional ability.  Initiated gabapentin at prior visit and recommended follow-up with established orthopedic provider.  Continue to experience pain despite gabapentin.  Due to ongoing pain, she requested referral to neurosurgery for further evaluation and potential treatment options.  She states her only option was back surgery which she is not interested at this time due to recent stroke and anticoagulation.  She continues to take gabapentin and does endorse improvement and benefit with ongoing use.  Denies side effects.  Due to decreased pain with gabapentin, she has slowly increased activity and daily functioning.  Continues to use a cane for ambulation and denies any recent falls.  Continues to live independently but  daughter will check on her frequently along with other family members.  She endorses improvement of her overall mood.  Cardiology started her on Xanax 0.25 mg daily as needed for anxiety but only had to take 1 time and has not had any recurrent anxiety symptoms.  Recently initiated care with new PCP Dr. Terese Door.  She is questioning potential return to driving.  No further concerns at this time.  Initial visit 10/25/2018: [name] is being seen today for hospital follow-up and is accompanied by her daughter.  She has recovered well from a stroke standpoint without residual deficits.  She has since completed home health therapies.  She continues on Eliquis without bleeding or bruising for atrial flutter and secondary stroke prevention along with treatment of bilateral DVT.  She continues on amiodarone taper and plans on doing 30-day cardiac event monitor after 11/2 (amioderone will be completed at that time).  She was initially staying with her daughter at hospital discharge but has since returned home.  She has been having difficulty with bilateral lower back pain with radiculopathy R>L leg pain.  Pain consists of stabbing sensation which is worsened with ambulation and progressively worsening.  She previously was being seen by Ortho with injections.  She had trialed gabapentin in the past which she tolerated well but would only use intermittently.  Prior MRI lumbar spine and 08/2017 showed multiple levels of spinal stenosis and degenerative changes.  Initially upon hospital discharge, she continued to have right-sided weakness therefore relying heavily on left side for ambulation.  She has not contacted her establish orthopedic provider, Dr. Tamala Julian, in regards to her worsening back pain.  She has also been experiencing increased anxiety and depression since returning home which she believes is more due to her constant pain which has been limiting mobility and functioning.  No further concerns at this time.   Denies new or worsening stroke/TIA symptoms.  Stroke admission 09/01/2018: [name] a 85 y.o.femalewith history of hypertension, hyperlipidemia, obesity, remote GI bleeding who presented to [facility] ED on 09/01/2018 with sudden onset aphasia and right hemiparesis.  Neurology consulted with stroke work-up revealing left MCA punctate small infarct due to left M1 occlusion status post TPA and IR with TICI 3 reperfusion embolic pattern in setting of bilateral DVT and new onset A. Fib.  She received TPA without complication.  MRI showed left MCA infarcts.  CTA head/neck showed occlusion of distal M1 with intermediate collateralization, bilateral ICA bulb arthrosclerosis and right VA origin stenosis.  2D echo normal EF.  Lower extremity venous Doppler showed right PTV, left popliteal and bilateral peroneal vein acute DVT.  Eliquis initiated for treatment of DVT and new onset atrial fibrillation.  No evidence of HLD with LDL 66 or DM with A1c 6.2.  HTN stable.  Due to atrial fibrillation/atrial flutter with RVR, amiodarone drip initiated with conversion to NSR and recommended continuation of amiodarone with tapering schedule for 6 to 12 weeks and follow-up with cardiology outpatient.  Other stroke risk factors include advanced age, former tobacco use, EtOH use, obesity, family history of stroke and OSA.  Discharged home in stable condition recommendation home health therapies.    ROS:   14 system review of systems performed and negative with exception of those listed in HPI  PMH:  Past Medical History:  Diagnosis Date   Allergic rhinitis    Anxiety    Benign neoplasm of breast 2013   Cardiac arrhythmia    Cardiomegaly    Cataract    CHF (congestive heart failure) (HCC)    Compulsive eating patterns    Compulsive overeating    Depression    Diastolic heart failure (HCC)    LVH with free wall thickness 1.4 cm EF 60% Dr. Tamala Julian    Diffuse cystic mastopathy 2013   Edema     Epistaxis    noted 12/20/18    GI bleeding    in ~2018    Gout    Hiatal hernia    HTN (hypertension)    Hyperlipidemia    Lump or mass in breast 2012   OA (osteoarthritis) of knee    with injections   Obesity    OSA (obstructive sleep apnea)    not on cpap   Other abnormal glucose    Peptic stricture of esophagus    Personal history of tobacco use, presenting hazards to health    Sciatica    Sinus infection 2010   Spinal stenosis    lumbar    Stroke ([place])    Unspecified sleep apnea     PSH:  Past Surgical History:  Procedure Laterality Date   adenosine cardiolite  1/08   low risk    APPENDECTOMY     BREAST BIOPSY     02/28/11 negative   BREAST LUMPECTOMY     right x1 ('89) left x2 ('70s, '90)   CARDIAC CATHETERIZATION  2001   normal per pt   COLONOSCOPY  11/02   diverticulosis   COLONOSCOPY  9/06   diverticulosis, hemorhoids   COLONOSCOPY N/A 10/01/2016   Procedure: COLONOSCOPY;  Surgeon: Gatha Mayer, MD;  Location: [facility];  Service: Endoscopy;  Laterality: N/A;   dexa  3/02   normal   EYE SURGERY  2012   cataract   IR CT HEAD LTD  09/01/2018   IR PERCUTANEOUS ART THROMBECTOMY/INFUSION INTRACRANIAL INC DIAG ANGIO  09/01/2018   JOINT REPLACEMENT     knee b/l 2011   knee replacement Right 9/11   R. Dr. Telford Nab   RADIOLOGY WITH ANESTHESIA N/A 09/01/2018   Procedure: IR WITH ANESTHESIA;  Surgeon: Luanne Bras, MD;  Location: [facility];  Service: Radiology;  Laterality: N/A;   sleep study  5/05   TONSILLECTOMY     TOTAL ABDOMINAL HYSTERECTOMY     fibroids, age of 33   TOTAL KNEE ARTHROPLASTY Left 2012   US TRANSVAGINAL PELVIC MODIFIED  2000, 2001    Social History:  Social History   Socioeconomic History   Marital status: Widowed    Spouse name: Not on file   Number of children: 1   Years of education: Not on file   Highest education level: Not on file  Occupational History   Occupation: Retired  school principal    Employer: RETIRED  Tobacco Use   Smoking status: Former Smoker    Packs/day: 0.50    Years: 20.00    Pack years: 10.00    Types: Cigarettes   Smokeless tobacco: Never Used   Tobacco comment: quit approx. 20 years ago  Vaping Use   Vaping Use: Never used  Substance and Sexual Activity   Alcohol use: Yes    Alcohol/week: 0.0 standard drinks    Comment: wine occasional   Drug use: No   Sexual activity: Never  Other Topics Concern  Not on file  Social History Narrative   Widowed x 30+ years as of 12/2018    1 daughter. Retired principal. 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    Social Determinants of Radio broadcast assistant Strain: Low Risk    Difficulty of Paying Living Expenses: Not hard at all  Food Insecurity: No Food Insecurity   Worried About Charity fundraiser in the Last Year: Never true   Arboriculturist in the Last Year: Never true  Transportation Needs: No Transportation Needs   Lack of Transportation (Medical): No   Lack of Transportation (Non-Medical): No  Physical Activity: Not on file  Stress: No Stress Concern Present   Feeling of Stress : Not at all  Social Connections: Unknown   Frequency of Communication with Friends and Family: More than three times a week   Frequency of Social Gatherings with Friends and Family: Not on file   Attends Religious Services: Not on file   Active Member of Clubs or Organizations: Not on file   Attends Archivist Meetings: Not on file   Marital Status: Not on file  Intimate Partner Violence: Not At Risk   Fear of Current or Ex-Partner: No   Emotionally Abused: No   Physically Abused: No   Sexually Abused: No    Family History:  Family History  Problem Relation Age of Onset   Stroke Father    Hypertension Mother        (a lot of animosity in their relationship)   Heart failure Mother    Gout Other        whole  family    Prostate cancer Brother    Cancer Brother        prostate    Hepatitis Brother        Hep C; alcoholism (terminal)   Drug abuse Brother        died 31   Breast cancer Sister    Cancer Sister        breast cancer   Other Brother        drug addiction    Medications:   Current Outpatient Medications on File Prior to Visit  Medication Sig Dispense Refill   allopurinol (ZYLOPRIM) 100 MG tablet TAKE 2 TABLETS BY MOUTH ONCE A DAY 180 tablet 3   ALPRAZolam (XANAX) 0.25 MG tablet Take 1 tablet (0.25 mg total) by mouth 2 (two) times daily as needed for anxiety. 60 tablet 5   amLODipine (NORVASC) 5 MG tablet Take 1 tablet (5 mg total) by mouth daily. Take additional 5 mg in pm if BP>130/>80 180 tablet 3   apixaban (ELIQUIS) 5 MG TABS tablet Take 1 tablet (5 mg total) by mouth 2 (two) times daily. 180 tablet 1   Ascorbic Acid (VITAMIN C PO) Take by mouth.     atorvastatin (LIPITOR) 20 MG tablet Take 1 tablet (20 mg total) by mouth daily. At night. STOP/discontinue Lovastatin 20 90 tablet 3   Cholecalciferol (VITAMIN D3 PO) Take by mouth.     colchicine 0.6 MG tablet Take 1 tablet (0.6 mg total) by mouth daily.     diclofenac Sodium (VOLTAREN) 1 % GEL Apply 2 g topically 4 (four) times daily as needed. 150 g 11   DULoxetine (CYMBALTA) 30 MG capsule Take 1 capsule (30 mg total) by mouth daily. 90 capsule 3   furosemide (LASIX) 40 MG tablet Take 1  tablet (40 mg total) by mouth daily. 90 tablet 0   gabapentin (NEURONTIN) 300 MG capsule Take 1 capsule (300 mg total) by mouth 3 (three) times daily as needed. 270 capsule 3   HYDROcodone-acetaminophen (NORCO/VICODIN) 5-325 MG tablet Take 1 tablet by mouth 2 (two) times daily as needed for moderate pain. 20 tablet 0   KLOR-CON M20 20 MEQ tablet TAKE 1 TABLET BY MOUTH DAILY. PLEASE SCHEDULE APPOINTMENT FOR FUTURE REFILLS. THANK YOU 30 tablet 0   Magnesium 250 MG TABS Take 250 mg by mouth daily.     metoprolol succinate  (TOPROL-XL) 100 MG 24 hr tablet TAKE 1 TABLET BY MOUTH DAILY WITH OR IMMEDIATLY FOLLOWING A MEAL 90 tablet 3   Multiple Vitamins-Minerals (MULTIVITAMIN WOMEN 50+ PO) Take by mouth.     potassium chloride SA (KLOR-CON) 10 MEQ tablet Take 1 tablet (10 mEq total) by mouth daily. 90 tablet 3   spironolactone (ALDACTONE) 25 MG tablet Take 0.5 tablets (12.5 mg total) by mouth daily. In the am Please make yearly appt with Dr. Tamala Julian for December for future refills. Thank you 45 tablet 1   No current facility-administered medications on file prior to visit.    Allergies:   Allergies  Allergen Reactions   Buspirone Hcl Other (See Comments)    REACTION: made her sleepy, ? swollen legs   Lisinopril Other (See Comments)    REACTION: dizziness   Prozac [Fluoxetine Hcl] Other (See Comments)    sleepy     Physical Exam  Vitals:   05/05/20 1405  BP: 127/75  Pulse: 73  Weight: 246 lb (111.6 kg)  Height: 5\' 7"  (1.702 m)   Body mass index is 38.53 kg/m. No exam data present  General: Obese pleasant elderly female, seated, in no evident distress Head: head normocephalic and atraumatic.   Neck: supple with no carotid or supraclavicular bruits Cardiovascular: regular rate and rhythm, no murmurs  Neurologic Exam Mental Status: Awake and fully alert. fluent speech and language. Oriented to place and time. Recent and remote memory intact. Attention span, concentration and fund of knowledge appropriate. Mood and affect appropriate.  Cranial Nerves:  Pupils equal, briskly reactive to light. Extraocular movements full without nystagmus. Visual fields full to confrontation. Hearing intact. Facial sensation intact. Face, tongue, palate moves normally and symmetrically.  Motor: Normal bulk and tone.  Normal weakness throughout all tested extremities Sensory.:  Decreased vibratory sensory RLE distal otherwise intact Coordination: Rapid alternating movements normal in all extremities. Finger-to-nose  performed accurately bilaterally.   Gait and Station: Arises from chair without difficulty. Stance is slightly hunched.  Gait abnormality with lateral pelvic tilt and mild unsteadiness with use of cane Reflexes: 1+ and symmetric. Toes downgoing.        ASSESSMENT/PLAN: Monica [name] is a 85 y.o. year old female presented to [facility] ED with sudden onset aphasia and right hemiparesis on 09/01/2018 with stroke work-up revealing left MCA punctate small infarct due to left M1 occlusion status post TPA and IR with TICI 3 reperfusion embolic pattern secondary to bilateral DVT and new onset atrial flutter. Vascular risk factors include new onset atrial flutter, bilateral DVT, HTN, chronic diastolic HF, advanced age, former tobacco use, EtOH use, obesity and OSA.      1. Left MCA infarcts: Continue Eliquis (apixaban) daily and atorvastatin for secondary stroke prevention.  Discussed secondary stroke prevention measures and importance of close PCP follow-up for aggressive stroke risk factor management including HTN with BP goal<130/90 and HLD with LDL goal<70  2. Atrial flutter: Continue Eliquis along with ongoing follow-up with cardiology 3. hx bilateral DVT: dx'd 08/2018 with repeat LE ultrasound 06/2019 showing resolution 4. Chronic cervical and lumbar pain: stable since prior visit.  No indication at this time for further evaluation.  Gabapentin and duloxetine currently managed by PCP   Follow up in 6 months or call earlier if needed  CC:  [facility] provider: Dr. Leonie Man McLean-Scocuzza, Nino Glow, MD     I spent 30 minutes of face-to-face and non-face-to-face time with patient and daughter.  This included previsit chart review, lab review, study review, order entry, electronic health record documentation, patient education and discussion regarding history of prior stroke, importance of managing stroke risk factors and answered all other questions to patient and daughter satisfaction  Frann Rider,  AGNP-BC  [facility] [address] [place], [place] [address]  Phone [phone] Fax [phone] Note: This document was prepared with digital dictation and possible smart phrase technology. Any transcriptional errors that result from this process are unintentional.

## 2020-05-05 NOTE — Patient Instructions (Signed)
Continue Eliquis (apixaban) daily  and atorvastatin  for secondary stroke prevention  Continue to follow with cardiology for atrial fibrillation and eliquis management  Continue to follow up with PCP regarding cholesterol and blood pressure management  Maintain strict control of hypertension with blood pressure goal below 130/90 and cholesterol with LDL cholesterol (bad cholesterol) goal below 70 mg/dL.       Followup in the future with me in 6 months or call earlier if needed       Thank you for coming to see [place] at [facility]. I hope we have been able to provide you high quality care today.  You may receive a patient satisfaction survey over the next few weeks. We would appreciate your feedback and comments so that we may continue to improve ourselves and the health of our patients.

## 2020-05-10 NOTE — Progress Notes (Signed)
I agree with the above plan 

## 2020-05-27 NOTE — Progress Notes (Signed)
Cardiology Office Note:    Date:  05/28/2020   ID:  Monica [name], DOB 05-19-1935, MRN [id number]  PCP:  McLean-Scocuzza, Nino Glow, MD  Cardiologist:  Sinclair Grooms, MD   Referring MD: McLean-Scocuzza, Monica [name] *   Chief Complaint  Patient presents with  . Atrial Fibrillation    On chronic apixaban therapy Amiodarone discontinued greater than a year ago.    History of Present Illness:    Monica [name] is a 85 y.o. female with a hx of Hypertension, chromic diastolic HF, CVA (embolic), pre-diabetes, GI bleeding, anticoagulation and amiodarone therapy.  There are no cardiac complaints.  She does not have lower extremity swelling, orthopnea, PND, or syncope.  She denies chest pain.  States that she is fatigued quite a bit.  Her daughter is not with her today.  She is visiting [place].  She is sleeping okay.  She denies palpitations.  Past Medical History:  Diagnosis Date  . Allergic rhinitis   . Anxiety   . Benign neoplasm of breast 2013  . Cardiac arrhythmia   . Cardiomegaly   . Cataract   . CHF (congestive heart failure) ([place])   . Compulsive eating patterns   . Compulsive overeating   . Depression   . Diastolic heart failure (HCC)    LVH with free wall thickness 1.4 cm EF 60% Dr. Tamala Julian   . Diffuse cystic mastopathy 2013  . Edema   . Epistaxis    noted 12/20/18   . GI bleeding    in ~2018   . Gout   . Hiatal hernia   . HTN (hypertension)   . Hyperlipidemia   . Lump or mass in breast 2012  . OA (osteoarthritis) of knee    with injections  . Obesity   . OSA (obstructive sleep apnea)    not on cpap  . Other abnormal glucose   . Peptic stricture of esophagus   . Personal history of tobacco use, presenting hazards to health   . Sciatica   . Sinus infection 2010  . Spinal stenosis    lumbar   . Stroke ([place])   . Unspecified sleep apnea     Past Surgical History:  Procedure Laterality Date  . adenosine cardiolite  1/08   low risk   . APPENDECTOMY     . BREAST BIOPSY     02/28/11 negative  . BREAST LUMPECTOMY     right x1 ('89) left x2 ('70s, '90)  . CARDIAC CATHETERIZATION  2001   normal per pt  . COLONOSCOPY  11/02   diverticulosis  . COLONOSCOPY  9/06   diverticulosis, hemorhoids  . COLONOSCOPY N/A 10/01/2016   Procedure: COLONOSCOPY;  Surgeon: Gatha Mayer, MD;  Location: [facility];  Service: Endoscopy;  Laterality: N/A;  . dexa  3/02   normal  . EYE SURGERY  2012   cataract  . IR CT HEAD LTD  09/01/2018  . IR PERCUTANEOUS ART THROMBECTOMY/INFUSION INTRACRANIAL INC DIAG ANGIO  09/01/2018  . JOINT REPLACEMENT     knee b/l 2011  . knee replacement Right 9/11   R. Dr. Telford Nab  . RADIOLOGY WITH ANESTHESIA N/A 09/01/2018   Procedure: IR WITH ANESTHESIA;  Surgeon: Luanne Bras, MD;  Location: [facility];  Service: Radiology;  Laterality: N/A;  . sleep study  5/05  . TONSILLECTOMY    . TOTAL ABDOMINAL HYSTERECTOMY     fibroids, age of 51  . TOTAL KNEE ARTHROPLASTY Left 2012  . US TRANSVAGINAL  PELVIC MODIFIED  2000, 2001    Current Medications: Current Meds  Medication Sig  . allopurinol (ZYLOPRIM) 100 MG tablet TAKE 2 TABLETS BY MOUTH ONCE A DAY  . ALPRAZolam (XANAX) 0.25 MG tablet Take 1 tablet (0.25 mg total) by mouth 2 (two) times daily as needed for anxiety.  Marland Kitchen apixaban (ELIQUIS) 5 MG TABS tablet Take 1 tablet (5 mg total) by mouth 2 (two) times daily.  . Ascorbic Acid (VITAMIN C PO) Take by mouth.  Marland Kitchen atorvastatin (LIPITOR) 20 MG tablet Take 1 tablet (20 mg total) by mouth daily. At night. STOP/discontinue Lovastatin 20  . Cholecalciferol (VITAMIN D3 PO) Take by mouth.  . diclofenac Sodium (VOLTAREN) 1 % GEL Apply 2 g topically 4 (four) times daily as needed.  . DULoxetine (CYMBALTA) 30 MG capsule Take 1 capsule (30 mg total) by mouth daily.  . furosemide (LASIX) 40 MG tablet Take 1 tablet (40 mg total) by mouth daily.  Marland Kitchen gabapentin (NEURONTIN) 300 MG capsule Take 1 capsule (300 mg total) by mouth 3 (three) times  daily as needed.  Marland Kitchen HYDROcodone-acetaminophen (NORCO/VICODIN) 5-325 MG tablet Take 1 tablet by mouth 2 (two) times daily as needed for moderate pain.  . Magnesium 250 MG TABS Take 250 mg by mouth daily.  . metoprolol succinate (TOPROL-XL) 100 MG 24 hr tablet TAKE 1 TABLET BY MOUTH DAILY WITH OR IMMEDIATLY FOLLOWING A MEAL  . Multiple Vitamins-Minerals (MULTIVITAMIN WOMEN 50+ PO) Take by mouth.  . potassium chloride SA (KLOR-CON) 10 MEQ tablet Take 1 tablet (10 mEq total) by mouth daily.  Marland Kitchen spironolactone (ALDACTONE) 25 MG tablet Take 0.5 tablets (12.5 mg total) by mouth daily. In the am Please make yearly appt with Dr. Tamala Julian for December for future refills. Thank you  . [DISCONTINUED] amLODipine (NORVASC) 5 MG tablet Take 1 tablet (5 mg total) by mouth daily. Take additional 5 mg in pm if BP>130/>80  . [DISCONTINUED] colchicine 0.6 MG tablet Take 1 tablet (0.6 mg total) by mouth daily.  . [DISCONTINUED] KLOR-CON M20 20 MEQ tablet TAKE 1 TABLET BY MOUTH DAILY. PLEASE SCHEDULE APPOINTMENT FOR FUTURE REFILLS. THANK YOU     Allergies:   Buspirone hcl, Lisinopril, and Prozac [fluoxetine hcl]   Social History   Socioeconomic History  . Marital status: Widowed    Spouse name: Not on file  . Number of children: 1  . Years of education: Not on file  . Highest education level: Not on file  Occupational History  . Occupation: Retired Ship broker: RETIRED  Tobacco Use  . Smoking status: Former Smoker    Packs/day: 0.50    Years: 20.00    Pack years: 10.00    Types: Cigarettes  . Smokeless tobacco: Never Used  . Tobacco comment: quit approx. 20 years ago  Vaping Use  . Vaping Use: Never used  Substance and Sexual Activity  . Alcohol use: Yes    Alcohol/week: 0.0 standard drinks    Comment: wine occasional  . Drug use: No  . Sexual activity: Never  Other Topics Concern  . Not on file  Social History Narrative   Widowed x 30+ years as of 12/2018    1 daughter. 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 Difficulty of Paying Living Expenses: Not hard at all  Food Insecurity: No Food  Insecurity  . Worried About Charity fundraiser in the Last Year: Never true  . Ran Out of Food in the Last Year: Never true  Transportation Needs: No Transportation Needs  . Lack of Transportation (Medical): No  . Lack of Transportation (Non-Medical): No  Physical Activity: Not on file  Stress: No Stress Concern Present  . Feeling of Stress : Not at all  Social Connections: Unknown  . Frequency of Communication with Friends and Family: More than three times a week  . Frequency of Social Gatherings with Friends and Family: Not on file  . Attends Religious Services: Not on file  . Active Member of Clubs or Organizations: Not on file  . Attends Archivist Meetings: Not on file  . Marital Status: Not on file     Family History: The patient's family history includes Breast cancer in her sister; Cancer in her brother and sister; Drug abuse in her brother; Gout in an other family member; Heart failure in her mother; Hepatitis in her brother; Hypertension in her mother; Other in her brother; Prostate cancer in her brother; Stroke in her father.  ROS:   Please see the history of present illness.    No blood in the urine or stool.  No neurological complaints.  Compliant with medications.  Feels anxious and nervous all the time.  She is moving out of her house into a condo.  All other systems reviewed and are negative.  EKGs/Labs/Other Studies Reviewed:    The following studies were reviewed today: No new data  EKG:  EKG atrial flutter with 41 AV conduction and ventricular rate 70 bpm.  Since the prior tracing performed in August 2020, atrial flutter is new.  Recent Labs: 09/05/2019: ALT 8; BUN 20; Creatinine, Ser 1.01; Hemoglobin  12.7; Platelets 221.0; Potassium 3.9; Sodium 141; TSH 2.21  Recent Lipid Panel    Component Value Date/Time   CHOL 183 09/05/2019 1050   TRIG 99.0 09/05/2019 1050   HDL 41.10 09/05/2019 1050   CHOLHDL 4 09/05/2019 1050   VLDL 19.8 09/05/2019 1050   LDLCALC 122 (H) 09/05/2019 1050   LDLDIRECT 131.8 12/09/2009 1643    Physical Exam:    VS:  BP 116/74   Pulse 70   Ht 5\' 7"  (1.702 m)   Wt 246 lb (111.6 kg)   SpO2 98%   BMI 38.53 kg/m     Wt Readings from Last 3 Encounters:  05/28/20 246 lb (111.6 kg)  05/05/20 246 lb (111.6 kg)  04/01/20 251 lb (113.9 kg)     GEN: Obese. No acute distress HEENT: Normal NECK: No JVD. LYMPHATICS: No lymphadenopathy CARDIAC: No murmur. IIRR no gallop, or edema. VASCULAR:  Normal Pulses. No bruits. RESPIRATORY:  Clear to auscultation without rales, wheezing or rhonchi  ABDOMEN: Soft, non-tender, non-distended, No pulsatile mass, MUSCULOSKELETAL: No deformity  SKIN: Warm and dry NEUROLOGIC:  Alert and oriented x 3 PSYCHIATRIC:  Normal affect   ASSESSMENT:    1. Atrial flutter, unspecified type ([place])   2. Essential hypertension   3. Hypertensive heart disease with heart failure ([place])   4. Chronic anticoagulation   5. High risk medication use   6. Other hyperlipidemia   7. Hypertension, unspecified type    PLAN:    In order of problems listed above:  1. New.  Off amiodarone for greater than 12 months.  No complaints of palpitations.  I wonder if she still flips in and out.  Will get a 72-hour monitor  to determine rate control and whether or not a flutter is continuous.  At this point do not plan to proceed with ablation or cardioversion given her asymptomatic state. 2. Blood pressure is elevated.  I did tell her that it is okay to take an extra 5 mg of Norvasc on days when the systolic blood pressure is greater than 155.  This is been an instruction for her for quite some time. 3. Well-controlled today.  Low-salt diet recommended.   Aerobic activity encouraged. 4. Continue apixaban 5 mg twice daily. 5. Amiodarone has been discontinued. 6. Continue Lipitor 20 mg/day. 7. Target blood pressure for age is 140/80 mmHg.  Blood work was performed in July 2021.  Has an upcoming appointment with Dr. Jacklynn Lewis  Clinical follow-up 6 months.   Medication Adjustments/Labs and Tests Ordered: Current medicines are reviewed at length with the patient today.  Concerns regarding medicines are outlined above.  Orders Placed This Encounter  Procedures  . EKG 12-Lead   Meds ordered this encounter  Medications  . amLODipine (NORVASC) 5 MG tablet    Sig: Take 1 tablet (5 mg total) by mouth daily. Take additional 5 mg in pm if BP>130/>80    Dispense:  180 tablet    Refill:  3    There are no Patient Instructions on file for this visit.   Signed, Sinclair Grooms, MD  05/28/2020 1:43 PM    [facility]

## 2020-05-28 ENCOUNTER — Other Ambulatory Visit: Payer: Self-pay

## 2020-05-28 ENCOUNTER — Ambulatory Visit: Payer: Medicare HMO | Admitting: Interventional Cardiology

## 2020-05-28 ENCOUNTER — Ambulatory Visit (INDEPENDENT_AMBULATORY_CARE_PROVIDER_SITE_OTHER): Payer: Medicare HMO

## 2020-05-28 ENCOUNTER — Encounter: Payer: Self-pay | Admitting: Interventional Cardiology

## 2020-05-28 ENCOUNTER — Encounter: Payer: Self-pay | Admitting: Radiology

## 2020-05-28 VITALS — BP 116/74 | HR 70 | Ht 67.0 in | Wt 246.0 lb

## 2020-05-28 DIAGNOSIS — I110 Hypertensive heart disease with heart failure: Secondary | ICD-10-CM | POA: Diagnosis not present

## 2020-05-28 DIAGNOSIS — Z7901 Long term (current) use of anticoagulants: Secondary | ICD-10-CM

## 2020-05-28 DIAGNOSIS — E7849 Other hyperlipidemia: Secondary | ICD-10-CM | POA: Diagnosis not present

## 2020-05-28 DIAGNOSIS — I4892 Unspecified atrial flutter: Secondary | ICD-10-CM

## 2020-05-28 DIAGNOSIS — I10 Essential (primary) hypertension: Secondary | ICD-10-CM | POA: Diagnosis not present

## 2020-05-28 DIAGNOSIS — Z79899 Other long term (current) drug therapy: Secondary | ICD-10-CM

## 2020-05-28 MED ORDER — AMLODIPINE BESYLATE 5 MG PO TABS: 5.0000 mg | ORAL_TABLET | Freq: Every day | ORAL | 3 refills | Status: DC

## 2020-05-28 NOTE — Patient Instructions (Signed)
Medication Instructions:  Your physician recommends that you continue on your current medications as directed. Please refer to the Current Medication list given to you today.  *If you need a refill on your cardiac medications before your next appointment, please call your pharmacy*   Lab Work: None If you have labs (blood work) drawn today and your tests are completely normal, you will receive your results only by: Marland Kitchen MyChart Message (if you have MyChart) OR . A paper copy in the mail If you have any lab test that is abnormal or we need to change your treatment, we will call you to review the results.   Testing/Procedures: Your physician recommends that you wear a monitor for 3 days.   Follow-Up: At [facility], you and your health needs are our priority.  As part of our continuing mission to provide you with exceptional heart care, we have created designated Provider Care Teams.  These Care Teams include your primary Cardiologist (physician) and Advanced Practice Providers (APPs -  Physician Assistants and Nurse Practitioners) who all work together to provide you with the care you need, when you need it.  We recommend signing up for the patient portal called "MyChart".  Sign up information is provided on this After Visit Summary.  MyChart is used to connect with patients for Virtual Visits (Telemedicine).  Patients are able to view lab/test results, encounter notes, upcoming appointments, etc.  Non-urgent messages can be sent to your provider as well.   To learn more about what you can do with MyChart, go to [email or web address].    Your next appointment:   6-8 month(s)  The format for your next appointment:   In Person  Provider:   You may see Sinclair Grooms, MD or one of the following Advanced Practice Providers on your designated Care Team:    Kathyrn Drown, NP    Other Instructions  [place] Monitor Instructions   Your physician has requested you wear  your ZIO patch monitor 3 days.   This is a single patch monitor.  [employer] supplies one patch monitor per enrollment.  Additional stickers are not available.   Please do not apply patch if you will be having a Nuclear Stress Test, Echocardiogram, Cardiac CT, MRI, or Chest Xray during the time frame you would be wearing the monitor. The patch cannot be worn during these tests.  You cannot remove and re-apply the ZIO XT patch monitor.   Your ZIO patch monitor will be sent USPS Priority mail from [facility] directly to your home address. The monitor may also be mailed to a PO BOX if home delivery is not available.   It may take 3-5 days to receive your monitor after you have been enrolled.   Once you have received you monitor, please review enclosed instructions.  Your monitor has already been registered assigning a specific monitor serial # to you.   Applying the monitor   Shave hair from upper left chest.   Hold abrader disc by orange tab.  Rub abrader in 40 strokes over left upper chest as indicated in your monitor instructions.   Clean area with 4 enclosed alcohol pads .  Use all pads to assure are is cleaned thoroughly.  Let dry.   Apply patch as indicated in monitor instructions.  Patch will be place under collarbone on left side of chest with arrow pointing upward.   Rub patch adhesive wings for 2 minutes.Remove white label marked "1".  Remove  white label marked "2".  Rub patch adhesive wings for 2 additional minutes.   While looking in a mirror, press and release button in center of patch.  A small green light will flash 3-4 times .  This will be your only indicator the monitor has been turned on.     Do not shower for the first 24 hours.  You may shower after the first 24 hours.   Press button if you feel a symptom. You will hear a small click.  Record Date, Time and Symptom in the Patient Log Book.   When you are ready to remove patch, follow instructions on last 2 pages  of Patient Log Book.  Stick patch monitor onto last page of Patient Log Book.   Place Patient Log Book in [place] box.  Use locking tab on box and tape box closed securely.  The [employer] has [employer] on it.  Please place in mailbox as soon as possible.  Your physician should have your test results approximately 7 days after the monitor has been mailed back to [facility].   Call [place] at [phone] if you have questions regarding your ZIO XT patch monitor.  Call them immediately if you see an orange light blinking on your monitor.   If your monitor falls off in less than 4 days contact our Monitor department at [phone].  If your monitor becomes loose or falls off after 4 days call [employer] at [phone] for suggestions on securing your monitor.

## 2020-05-28 NOTE — Progress Notes (Signed)
Enrolled patient for a 3 day Zio XT Monitor to be mailed to patients home.  °

## 2020-06-02 ENCOUNTER — Telehealth: Payer: Self-pay | Admitting: Internal Medicine

## 2020-06-02 NOTE — Telephone Encounter (Signed)
If concern over possible bladder infection, needs to be seen.

## 2020-06-02 NOTE — Telephone Encounter (Signed)
Thank you.    Dr Tanija Germani 

## 2020-06-02 NOTE — Telephone Encounter (Signed)
Patient having burning and pain wit urination like UTI per patient she has had in the past, Patient took some OTC AZO and this has helped, scheduled her with the NP for tomorrow at 9.

## 2020-06-02 NOTE — Telephone Encounter (Signed)
Patient has cystitis and would like to know if she can get medication over the counter or get antibiotic.

## 2020-06-03 ENCOUNTER — Other Ambulatory Visit: Payer: Self-pay

## 2020-06-03 ENCOUNTER — Ambulatory Visit (INDEPENDENT_AMBULATORY_CARE_PROVIDER_SITE_OTHER): Payer: Medicare HMO | Admitting: Family

## 2020-06-03 ENCOUNTER — Encounter: Payer: Self-pay | Admitting: Family

## 2020-06-03 VITALS — BP 110/82 | HR 95 | Temp 97.6°F | Ht 67.0 in | Wt 245.4 lb

## 2020-06-03 DIAGNOSIS — R300 Dysuria: Secondary | ICD-10-CM | POA: Diagnosis not present

## 2020-06-03 HISTORY — DX: Dysuria: R30.0

## 2020-06-03 LAB — POCT URINALYSIS DIPSTICK
Glucose, UA: POSITIVE — AB
Ketones, UA: POSITIVE
Nitrite, UA: POSITIVE
Protein, UA: POSITIVE — AB
Spec Grav, UA: 1.01 (ref 1.010–1.025)
Urobilinogen, UA: >=8 E.U./dL — AB
pH, UA: 5 (ref 5.0–8.0)

## 2020-06-03 LAB — URINALYSIS, ROUTINE W REFLEX MICROSCOPIC
Specific Gravity, Urine: 1.02 (ref 1.000–1.030)
pH: 5 (ref 5.0–8.0)

## 2020-06-03 MED ORDER — AMOXICILLIN-POT CLAVULANATE 875-125 MG PO TABS: 1.0000 | ORAL_TABLET | Freq: Two times a day (BID) | ORAL | 0 refills | Status: AC

## 2020-06-03 NOTE — Progress Notes (Signed)
Subjective:    Patient ID: Monica [name], female    DOB: 05/03/1935, 85 y.o.   MRN: [id number]  CC: Monica [name] is a 85 y.o. female who presents today for an acute visit.    HPI: Complains of frequent urination, dysuria,  x 5 days, worsening.   She takes lasix 40mg  QD and potassium chloride  10 meq daily. She started Azo yesterday.  She has chronic right lower back pain, which is not worse. No back pain today.  No hematuria,  fever, chills, nausea, constipation, flank pain  Weight stable.     No h/o ckd  Last UTI 05/2019 ; no organism noted on urine culture  H/o HTN, stroke, chronic diastolic heart failture HISTORY:  Past Medical History:  Diagnosis Date  . Allergic rhinitis   . Anxiety   . Benign neoplasm of breast 2013  . Cardiac arrhythmia   . Cardiomegaly   . Cataract   . CHF (congestive heart failure) ([place])   . Compulsive eating patterns   . Compulsive overeating   . Depression   . Diastolic heart failure ([facility])    LVH with free wall thickness 1.4 cm EF 60% Dr. Tamala Julian   . Diffuse cystic mastopathy 2013  . Edema   . Epistaxis    noted 12/20/18   . GI bleeding    in ~2018   . Gout   . Hiatal hernia   . HTN (hypertension)   . Hyperlipidemia   . Lump or mass in breast 2012  . OA (osteoarthritis) of knee    with injections  . Obesity   . OSA (obstructive sleep apnea)    not on cpap  . Other abnormal glucose   . Peptic stricture of esophagus   . Personal history of tobacco use, presenting hazards to health   . Sciatica   . Sinus infection 2010  . Spinal stenosis    lumbar   . Stroke ([place])   . Unspecified sleep apnea    Past Surgical History:  Procedure Laterality Date  . adenosine cardiolite  1/08   low risk   . APPENDECTOMY    . BREAST BIOPSY     02/28/11 negative  . BREAST LUMPECTOMY     right x1 ('89) left x2 ('70s, '90)  . CARDIAC CATHETERIZATION  2001   normal per pt  . COLONOSCOPY  11/02   diverticulosis  . COLONOSCOPY  9/06    diverticulosis, hemorhoids  . COLONOSCOPY N/A 10/01/2016   Procedure: COLONOSCOPY;  Surgeon: Gatha Mayer, MD;  Location: [facility];  Service: Endoscopy;  Laterality: N/A;  . dexa  3/02   normal  . EYE SURGERY  2012   cataract  . IR CT HEAD LTD  09/01/2018  . IR PERCUTANEOUS ART THROMBECTOMY/INFUSION INTRACRANIAL INC DIAG ANGIO  09/01/2018  . JOINT REPLACEMENT     knee b/l 2011  . knee replacement Right 9/11   R. Dr. Telford Nab  . RADIOLOGY WITH ANESTHESIA N/A 09/01/2018   Procedure: IR WITH ANESTHESIA;  Surgeon: Luanne Bras, MD;  Location: [facility];  Service: Radiology;  Laterality: N/A;  . sleep study  5/05  . TONSILLECTOMY    . TOTAL ABDOMINAL HYSTERECTOMY     fibroids, age of 74  . TOTAL KNEE ARTHROPLASTY Left 2012  . US TRANSVAGINAL PELVIC MODIFIED  2000, 2001   Family History  Problem Relation Age of Onset  . Stroke Father   . Hypertension Mother        (  a lot of animosity in their relationship)  . Heart failure Mother   . Gout Other        whole family   . Prostate cancer Brother   . Cancer Brother        prostate   . Hepatitis Brother        Hep C; alcoholism (terminal)  . Drug abuse Brother        died 20  . Breast cancer Sister   . Cancer Sister        breast cancer  . Other Brother        drug addiction    Allergies: Buspirone hcl, Lisinopril, and Prozac [fluoxetine hcl] Current Outpatient Medications on File Prior to Visit  Medication Sig Dispense Refill  . allopurinol (ZYLOPRIM) 100 MG tablet TAKE 2 TABLETS BY MOUTH ONCE A DAY 180 tablet 3  . ALPRAZolam (XANAX) 0.25 MG tablet Take 1 tablet (0.25 mg total) by mouth 2 (two) times daily as needed for anxiety. 60 tablet 5  . amLODipine (NORVASC) 5 MG tablet Take 1 tablet (5 mg total) by mouth daily. Take additional 5 mg in pm if BP>130/>80 180 tablet 3  . apixaban (ELIQUIS) 5 MG TABS tablet Take 1 tablet (5 mg total) by mouth 2 (two) times daily. 180 tablet 1  . Ascorbic Acid (VITAMIN C PO) Take by  mouth.    Marland Kitchen atorvastatin (LIPITOR) 20 MG tablet Take 1 tablet (20 mg total) by mouth daily. At night. STOP/discontinue Lovastatin 20 90 tablet 3  . Cholecalciferol (VITAMIN D3 PO) Take by mouth.    . diclofenac Sodium (VOLTAREN) 1 % GEL Apply 2 g topically 4 (four) times daily as needed. 150 g 11  . DULoxetine (CYMBALTA) 30 MG capsule Take 1 capsule (30 mg total) by mouth daily. 90 capsule 3  . furosemide (LASIX) 40 MG tablet Take 1 tablet (40 mg total) by mouth daily. 90 tablet 0  . gabapentin (NEURONTIN) 300 MG capsule Take 1 capsule (300 mg total) by mouth 3 (three) times daily as needed. 270 capsule 3  . HYDROcodone-acetaminophen (NORCO/VICODIN) 5-325 MG tablet Take 1 tablet by mouth 2 (two) times daily as needed for moderate pain. 20 tablet 0  . Magnesium 250 MG TABS Take 250 mg by mouth daily.    . metoprolol succinate (TOPROL-XL) 100 MG 24 hr tablet TAKE 1 TABLET BY MOUTH DAILY WITH OR IMMEDIATLY FOLLOWING A MEAL 90 tablet 3  . Multiple Vitamins-Minerals (MULTIVITAMIN WOMEN 50+ PO) Take by mouth.    . potassium chloride SA (KLOR-CON) 10 MEQ tablet Take 1 tablet (10 mEq total) by mouth daily. 90 tablet 3  . spironolactone (ALDACTONE) 25 MG tablet Take 0.5 tablets (12.5 mg total) by mouth daily. In the am Please make yearly appt with Dr. Tamala Julian for December for future refills. Thank you 45 tablet 1   No current facility-administered medications on file prior to visit.    Social History   Tobacco Use  . Smoking status: Former Smoker    Packs/day: 0.50    Years: 20.00    Pack years: 10.00    Types: Cigarettes  . Smokeless tobacco: Never Used  . Tobacco comment: quit approx. 20 years ago  Vaping Use  . Vaping Use: Never used  Substance Use Topics  . Alcohol use: Yes    Alcohol/week: 0.0 standard drinks    Comment: wine occasional  . Drug use: No    Review of Systems  Constitutional: Negative for chills and fever.  Respiratory: Negative for cough.   Cardiovascular: Negative for  chest pain and palpitations.  Gastrointestinal: Negative for nausea and vomiting.  Genitourinary: Positive for dysuria and frequency. Negative for flank pain and hematuria.      Objective:    BP 110/82   Pulse 95   Temp 97.6 F (36.4 C)   Ht 5\' 7"  (1.702 m)   Wt 245 lb 6.4 oz (111.3 kg)   SpO2 98%   BMI 38.44 kg/m   Wt Readings from Last 3 Encounters:  06/03/20 245 lb 6.4 oz (111.3 kg)  05/28/20 246 lb (111.6 kg)  05/05/20 246 lb (111.6 kg)    Physical Exam Vitals reviewed.  Constitutional:      Appearance: She is well-developed.  Cardiovascular:     Rate and Rhythm: Normal rate and regular rhythm.     Pulses: Normal pulses.     Heart sounds: Normal heart sounds.  Pulmonary:     Effort: Pulmonary effort is normal.     Breath sounds: Normal breath sounds. No wheezing, rhonchi or rales.  Skin:    General: Skin is warm and dry.  Neurological:     Mental Status: She is alert.  Psychiatric:        Speech: Speech normal.        Behavior: Behavior normal.        Thought Content: Thought content normal.        Assessment & Plan:   Problem List Items Addressed This Visit      Other   Dysuria - Primary    Afebrile. She is not toxic in appearance. Urine is cloudy, positive for bilirubin, ketones, moderate blood , leukocytes and nitrites. We will start augmentin today ahead of urine culture. She will let me know how she is doing.      Relevant Medications   amoxicillin-clavulanate (AUGMENTIN) 875-125 MG tablet   Other Relevant Orders   Urine Culture   Urinalysis, Routine w reflex microscopic   POCT Urinalysis Dipstick (Completed)       I am having Monica [name] start on amoxicillin-clavulanate. I am also having her maintain her Magnesium, HYDROcodone-acetaminophen, potassium chloride, Cholecalciferol (VITAMIN D3 PO), Multiple Vitamins-Minerals (MULTIVITAMIN WOMEN 50+ PO), Ascorbic Acid (VITAMIN C PO), diclofenac Sodium, atorvastatin, gabapentin, ALPRAZolam,  allopurinol, DULoxetine, metoprolol succinate, spironolactone, apixaban, furosemide, and amLODipine.   Meds ordered this encounter  Medications  . amoxicillin-clavulanate (AUGMENTIN) 875-125 MG tablet    Sig: Take 1 tablet by mouth 2 (two) times daily for 7 days.    Dispense:  14 tablet    Refill:  0    Order Specific Question:   Supervising Provider    Answer:   Crecencio Mc [[id number]]    Return precautions given.   Risks, benefits, and alternatives of the medications and treatment plan prescribed today were discussed, and patient expressed understanding.   Education regarding symptom management and diagnosis given to patient on AVS.  Continue to follow with McLean-Scocuzza, Nino Glow, MD for routine health maintenance.   Monica [name] and I agreed with plan.   Mable Paris, FNP

## 2020-06-03 NOTE — Patient Instructions (Signed)
Start augmentin ( antibiotic)  Ensure to take probiotics while on antibiotics and also for 2 weeks after completion. This can either be by eating yogurt daily or taking a probiotic supplement over the counter such as Culturelle.It is important to re-colonize the gut with good bacteria and also to prevent any diarrheal infections associated with antibiotic use.   Plenty of water  We will call you with results of urine culture and IF antibiotic needs to be changed  If you develop worsening symptoms OR fever, please go to urgent care   Dysuria Dysuria is pain or discomfort during urination. The pain or discomfort may be felt in the part of the body that drains urine from the bladder (urethra) or in the surrounding tissue of the genitals. The pain may also be felt in the groin area, lower abdomen, or lower back. You may have to urinate frequently or have the sudden feeling that you have to urinate (urgency). Dysuria can affect anyone, but it is more common in females. Dysuria can be caused by many different things, including:  Urinary tract infection.  Kidney stones or bladder stones.  Certain STIs (sexually transmitted infections), such as chlamydia.  Dehydration.  Inflammation of the tissues of the vagina.  Use of certain medicines.  Use of certain soaps or scented products that cause irritation. Follow these instructions at home: Medicines  Take over-the-counter and prescription medicines only as told by your health care provider.  If you were prescribed an antibiotic medicine, take it as told by your health care provider. Do not stop taking the antibiotic even if you start to feel better. Eating and drinking  Drink enough fluid to keep your urine pale yellow.  Avoid caffeinated beverages, tea, and alcohol. These beverages can irritate the bladder and make dysuria worse. In males, alcohol may irritate the prostate.   General instructions  Watch your condition for any  changes.  Urinate often. Avoid holding urine for long periods of time.  If you are female, you should wipe from front to back after urinating or having a bowel movement. Use each piece of toilet paper only once.  Empty your bladder after sex.  Keep all follow-up visits. This is important.  If you had any tests done to find the cause of dysuria, it is up to you to get your test results. Ask your health care provider, or the department that is doing the test, when your results will be ready. Contact a health care provider if:  You have a fever.  You develop pain in your back or sides.  You have nausea or vomiting.  You have blood in your urine.  You are not urinating as often as you usually do. Get help right away if:  Your pain is severe and not relieved with medicines.  You cannot eat or drink without vomiting.  You are confused.  You have a rapid heartbeat while resting.  You have shaking or chills.  You feel extremely weak. Summary  Dysuria is pain or discomfort while urinating. Many different conditions can lead to dysuria.  If you have dysuria, you may have to urinate frequently or have the sudden feeling that you have to urinate (urgency).  Watch your condition for any changes. Keep all follow-up visits.  Make sure that you urinate often and drink enough fluid to keep your urine pale yellow. This information is not intended to replace advice given to you by your health care provider. Make sure you discuss any questions you  have with your health care provider. Document Revised: 09/20/2019 Document Reviewed: 09/20/2019 Elsevier Patient Education  [place].

## 2020-06-03 NOTE — Assessment & Plan Note (Signed)
Afebrile. She is not toxic in appearance. Urine is cloudy, positive for bilirubin, ketones, moderate blood , leukocytes and nitrites. We will start augmentin today ahead of urine culture. She will let me know how she is doing.

## 2020-06-05 LAB — URINE CULTURE
MICRO NUMBER:: 11765648
SPECIMEN QUALITY:: ADEQUATE

## 2020-06-08 ENCOUNTER — Other Ambulatory Visit: Payer: Self-pay | Admitting: Interventional Cardiology

## 2020-06-08 ENCOUNTER — Other Ambulatory Visit: Payer: Self-pay | Admitting: Internal Medicine

## 2020-06-08 DIAGNOSIS — E785 Hyperlipidemia, unspecified: Secondary | ICD-10-CM

## 2020-06-10 ENCOUNTER — Telehealth: Payer: Self-pay

## 2020-06-10 DIAGNOSIS — I4892 Unspecified atrial flutter: Secondary | ICD-10-CM

## 2020-06-10 NOTE — Telephone Encounter (Signed)
I tried to call patient for lab results, but phone only rings. Unable to leave message.

## 2020-06-17 DIAGNOSIS — I4892 Unspecified atrial flutter: Secondary | ICD-10-CM | POA: Diagnosis not present

## 2020-06-26 ENCOUNTER — Other Ambulatory Visit: Payer: Self-pay

## 2020-06-26 ENCOUNTER — Other Ambulatory Visit: Payer: Medicare HMO

## 2020-06-26 ENCOUNTER — Encounter: Payer: Self-pay | Admitting: Internal Medicine

## 2020-06-26 ENCOUNTER — Other Ambulatory Visit (INDEPENDENT_AMBULATORY_CARE_PROVIDER_SITE_OTHER): Payer: Medicare HMO

## 2020-06-26 ENCOUNTER — Ambulatory Visit (INDEPENDENT_AMBULATORY_CARE_PROVIDER_SITE_OTHER): Payer: Medicare HMO | Admitting: Internal Medicine

## 2020-06-26 VITALS — BP 124/80 | HR 86 | Temp 97.3°F | Ht 67.0 in | Wt 246.0 lb

## 2020-06-26 DIAGNOSIS — R309 Painful micturition, unspecified: Secondary | ICD-10-CM

## 2020-06-26 DIAGNOSIS — M545 Low back pain, unspecified: Secondary | ICD-10-CM

## 2020-06-26 DIAGNOSIS — E7849 Other hyperlipidemia: Secondary | ICD-10-CM | POA: Diagnosis not present

## 2020-06-26 DIAGNOSIS — G8929 Other chronic pain: Secondary | ICD-10-CM

## 2020-06-26 DIAGNOSIS — E559 Vitamin D deficiency, unspecified: Secondary | ICD-10-CM

## 2020-06-26 DIAGNOSIS — M5416 Radiculopathy, lumbar region: Secondary | ICD-10-CM

## 2020-06-26 DIAGNOSIS — R7989 Other specified abnormal findings of blood chemistry: Secondary | ICD-10-CM

## 2020-06-26 DIAGNOSIS — M25569 Pain in unspecified knee: Secondary | ICD-10-CM | POA: Diagnosis not present

## 2020-06-26 DIAGNOSIS — I10 Essential (primary) hypertension: Secondary | ICD-10-CM

## 2020-06-26 DIAGNOSIS — M109 Gout, unspecified: Secondary | ICD-10-CM

## 2020-06-26 DIAGNOSIS — Z87440 Personal history of urinary (tract) infections: Secondary | ICD-10-CM

## 2020-06-26 DIAGNOSIS — R609 Edema, unspecified: Secondary | ICD-10-CM

## 2020-06-26 LAB — CBC WITH DIFFERENTIAL/PLATELET
Basophils Absolute: 0.1 10*3/uL (ref 0.0–0.1)
Basophils Relative: 1 % (ref 0.0–3.0)
Eosinophils Absolute: 0.1 10*3/uL (ref 0.0–0.7)
Eosinophils Relative: 1.8 % (ref 0.0–5.0)
HCT: 39.9 % (ref 36.0–46.0)
Hemoglobin: 12.8 g/dL (ref 12.0–15.0)
Lymphocytes Relative: 24.1 % (ref 12.0–46.0)
Lymphs Abs: 1.3 10*3/uL (ref 0.7–4.0)
MCHC: 32.2 g/dL (ref 30.0–36.0)
MCV: 87.9 fl (ref 78.0–100.0)
Monocytes Absolute: 0.7 10*3/uL (ref 0.1–1.0)
Monocytes Relative: 13.1 % — ABNORMAL HIGH (ref 3.0–12.0)
Neutro Abs: 3.2 10*3/uL (ref 1.4–7.7)
Neutrophils Relative %: 60 % (ref 43.0–77.0)
Platelets: 228 10*3/uL (ref 150.0–400.0)
RBC: 4.54 Mil/uL (ref 3.87–5.11)
RDW: 15.5 % (ref 11.5–15.5)
WBC: 5.3 10*3/uL (ref 4.0–10.5)

## 2020-06-26 LAB — COMPREHENSIVE METABOLIC PANEL
ALT: 11 U/L (ref 0–35)
AST: 14 U/L (ref 0–37)
Albumin: 4.1 g/dL (ref 3.5–5.2)
Alkaline Phosphatase: 104 U/L (ref 39–117)
BUN: 28 mg/dL — ABNORMAL HIGH (ref 6–23)
CO2: 26 mEq/L (ref 19–32)
Calcium: 9.4 mg/dL (ref 8.4–10.5)
Chloride: 106 mEq/L (ref 96–112)
Creatinine, Ser: 1.22 mg/dL — ABNORMAL HIGH (ref 0.40–1.20)
GFR: 41.23 mL/min — ABNORMAL LOW (ref >60.00)
Glucose, Bld: 85 mg/dL (ref 70–99)
Potassium: 4.2 mEq/L (ref 3.5–5.1)
Sodium: 141 mEq/L (ref 135–145)
Total Bilirubin: 0.7 mg/dL (ref 0.2–1.2)
Total Protein: 7 g/dL (ref 6.0–8.3)

## 2020-06-26 LAB — LIPID PANEL
Cholesterol: 115 mg/dL (ref 0–200)
HDL: 34.9 mg/dL — ABNORMAL LOW (ref >39.00)
LDL Cholesterol: 65 mg/dL (ref 0–99)
NonHDL: 79.65
Total CHOL/HDL Ratio: 3
Triglycerides: 72 mg/dL (ref 0.0–149.0)
VLDL: 14.4 mg/dL (ref 0.0–40.0)

## 2020-06-26 LAB — URIC ACID: Uric Acid, Serum: 8 mg/dL — ABNORMAL HIGH (ref 2.4–7.0)

## 2020-06-26 LAB — VITAMIN D 25 HYDROXY (VIT D DEFICIENCY, FRACTURES): VITD: 53.49 ng/mL (ref 30.00–100.00)

## 2020-06-26 MED ORDER — HYDROCODONE-ACETAMINOPHEN 5-325 MG PO TABS: 1.0000 | ORAL_TABLET | Freq: Two times a day (BID) | ORAL | 0 refills | Status: DC | PRN

## 2020-06-26 MED ORDER — GABAPENTIN 300 MG PO CAPS: 600.0000 mg | ORAL_CAPSULE | Freq: Every day | ORAL | 3 refills | Status: DC

## 2020-06-26 NOTE — Addendum Note (Signed)
Addended by: Octavio Manns E on: 06/26/2020 02:55 PM   Modules accepted: Orders

## 2020-06-26 NOTE — Progress Notes (Signed)
Chief Complaint  Patient presents with  . Urinary Tract Infection   F/u  1.  C/w uti pain w urination in 05/2020 and low pain pain rad to pelvis 8/10 urinating 2x per day and trouble making to restroom and increased freq 2. Chronic pain knees, back will sign pain contract today tried norco vicodin and wants refill on gabapentin 600 mg qhs Today c/o right post knee pain  3. H/o gout no flares if if should be taking allopurinol 100 mg qd  4. H/o leg edema on lasix 40 mg qd and spironlactone 12.5 mg qd ? Dose of K 10-20 meq qd   Review of Systems  Constitutional: Negative for weight loss.  HENT: Negative for hearing loss.   Eyes: Negative for blurred vision.  Respiratory: Negative for shortness of breath.   Cardiovascular: Positive for leg swelling.  Gastrointestinal: Negative for abdominal pain.  Genitourinary: Positive for dysuria, frequency and urgency.  Musculoskeletal: Positive for back pain and joint pain.  Skin: Negative for rash.  Neurological: Negative for headaches.   Past Medical History:  Diagnosis Date  . Allergic rhinitis   . Anxiety   . Benign neoplasm of breast 2013  . Cardiac arrhythmia   . Cardiomegaly   . Cataract   . CHF (congestive heart failure) ([place])   . Compulsive eating patterns   . Compulsive overeating   . Depression   . Diastolic heart failure (H[facility])    LVH with free wall thickness 1.4 cm EF 60% Dr. Tamala Julian   . Diffuse cystic mastopathy 2013  . Edema   . Epistaxis    noted 12/20/18   . GI bleeding    in ~2018   . Gout   . Hiatal hernia   . HTN (hypertension)   . Hyperlipidemia   . Lump or mass in breast 2012  . OA (osteoarthritis) of knee    with injections  . Obesity   . OSA (obstructive sleep apnea)    not on cpap  . Other abnormal glucose   . Peptic stricture of esophagus   . Personal history of tobacco use, presenting hazards to health   . Sciatica   . Sinus infection 2010  . Spinal stenosis    lumbar   . Stroke ([place])   .  Unspecified sleep apnea   . UTI (urinary tract infection)    e coli 06/2020   Past Surgical History:  Procedure Laterality Date  . adenosine cardiolite  1/08   low risk   . APPENDECTOMY    . BREAST BIOPSY     02/28/11 negative  . BREAST LUMPECTOMY     right x1 ('89) left x2 ('70s, '90)  . CARDIAC CATHETERIZATION  2001   normal per pt  . COLONOSCOPY  11/02   diverticulosis  . COLONOSCOPY  9/06   diverticulosis, hemorhoids  . COLONOSCOPY N/A 10/01/2016   Procedure: COLONOSCOPY;  Surgeon: Gatha Mayer, MD;  Location: [facility];  Service: Endoscopy;  Laterality: N/A;  . dexa  3/02   normal  . EYE SURGERY  2012   cataract  . IR CT HEAD LTD  09/01/2018  . IR PERCUTANEOUS ART THROMBECTOMY/INFUSION INTRACRANIAL INC DIAG ANGIO  09/01/2018  . JOINT REPLACEMENT     knee b/l 2011  . knee replacement Right 9/11   R. Dr. Telford Nab  . RADIOLOGY WITH ANESTHESIA N/A 09/01/2018   Procedure: IR WITH ANESTHESIA;  Surgeon: Luanne Bras, MD;  Location: [facility];  Service: Radiology;  Laterality: N/A;  .  sleep study  5/05  . TONSILLECTOMY    . TOTAL ABDOMINAL HYSTERECTOMY     fibroids, age of 77  . TOTAL KNEE ARTHROPLASTY Left 2012  . US TRANSVAGINAL PELVIC MODIFIED  2000, 2001   Family History  Problem Relation Age of Onset  . Stroke Father   . Hypertension Mother        (a lot of animosity in their relationship)  . Heart failure Mother   . Gout Other        whole family   . Prostate cancer Brother   . Cancer Brother        prostate   . Hepatitis Brother        Hep C; alcoholism (terminal)  . Drug abuse Brother        died 94  . Breast cancer Sister   . Cancer Sister        breast cancer  . Other Brother        drug addiction   Social History   Socioeconomic History  . Marital status: Widowed    Spouse name: Not on file  . Number of children: 1  . Years of education: Not on file  . Highest education level: Not on file  Occupational History  . Occupation: Retired Scientist, physiological: RETIRED  Tobacco Use  . Smoking status: Former Smoker    Packs/day: 0.50    Years: 20.00    Pack years: 10.00    Types: Cigarettes  . Smokeless tobacco: Never Used  . Tobacco comment: quit approx. 20 years ago  Vaping Use  . Vaping Use: Never used  Substance and Sexual Activity  . Alcohol use: Yes    Alcohol/week: 0.0 standard drinks    Comment: wine occasional  . Drug use: No  . Sexual activity: Never  Other Topics Concern  . Not on file  Social History Narrative   Widowed x 30+ years as of 12/2018    1 daughter. Retired principal. 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 Difficulty of Paying Living Expenses: Not hard at all  Food Insecurity: No Food Insecurity  . Worried About Charity fundraiser in the Last Year: Never true  . Ran Out of Food in the Last Year: Never true  Transportation Needs: No Transportation Needs  . Lack of Transportation (Medical): No  . Lack of Transportation (Non-Medical): No  Physical Activity: Not on file  Stress: No Stress Concern Present  . Feeling of Stress : Not at all  Social Connections: Unknown  . Frequency of Communication with Friends and Family: More than three times a week  . Frequency of Social Gatherings with Friends and Family: Not on file  . Attends Religious Services: Not on file  . Active Member of Clubs or Organizations: Not on file  . Attends Archivist Meetings: Not on file  . Marital Status: Not on file  Intimate Partner Violence: Not At Risk  . Fear of Current or Ex-Partner: No  . Emotionally Abused: No  . Physically Abused: No  . Sexually Abused: No   Current Meds  Medication Sig  . ALPRAZolam (XANAX) 0.25 MG tablet Take 1 tablet (0.25 mg total) by mouth 2 (two) times daily as needed for anxiety.  Marland Kitchen amLODipine (NORVASC) 5 MG tablet Take 1 tablet (5 mg  total) by mouth  daily. Take additional 5 mg in pm if BP>130/>80  . apixaban (ELIQUIS) 5 MG TABS tablet Take 1 tablet (5 mg total) by mouth 2 (two) times daily.  Marland Kitchen atorvastatin (LIPITOR) 20 MG tablet TAKE 1 TABLET (20 MG TOTAL) BY MOUTH DAILY. AT NIGHT. STOP/DISCONTINUE LOVASTATIN 20  . Cholecalciferol (VITAMIN D3 PO) Take by mouth.  . diclofenac Sodium (VOLTAREN) 1 % GEL Apply 2 g topically 4 (four) times daily as needed.  . DULoxetine (CYMBALTA) 30 MG capsule Take 1 capsule (30 mg total) by mouth daily.  . furosemide (LASIX) 40 MG tablet TAKE 1 TABLET BY MOUTH EVERY DAY  . Magnesium 250 MG TABS Take 250 mg by mouth daily.  . metoprolol succinate (TOPROL-XL) 100 MG 24 hr tablet TAKE 1 TABLET BY MOUTH DAILY WITH OR IMMEDIATLY FOLLOWING A MEAL  . spironolactone (ALDACTONE) 25 MG tablet Take 0.5 tablets (12.5 mg total) by mouth daily. In the am Please make yearly appt with Dr. Tamala Julian for December for future refills. Thank you  . [DISCONTINUED] gabapentin (NEURONTIN) 300 MG capsule Take 1 capsule (300 mg total) by mouth 3 (three) times daily as needed.  . [DISCONTINUED] potassium chloride SA (KLOR-CON) 10 MEQ tablet Take 1 tablet (10 mEq total) by mouth daily.   Allergies  Allergen Reactions  . Buspirone Hcl Other (See Comments)    REACTION: made her sleepy, ? swollen legs  . Lisinopril Other (See Comments)    REACTION: dizziness  . Prozac [Fluoxetine Hcl] Other (See Comments)    Sleepy as of 06/26/20 states did not make sleepy   Recent Results (from the past 2160 hour(s))  Urine Culture     Status: Abnormal   Collection Time: 06/03/20  9:33 AM   Specimen: Urine  Result Value Ref Range   MICRO NUMBER: [id number]    SPECIMEN QUALITY: Adequate    Sample Source NOT GIVEN    STATUS: FINAL    ISOLATE 1: Escherichia coli (A)     Comment: Greater than 100,000 CFU/mL of Escherichia coli      Susceptibility   Escherichia coli - URINE CULTURE, REFLEX    AMOX/CLAVULANIC <=2 Sensitive     AMPICILLIN <=2 Sensitive      AMPICILLIN/SULBACTAM <=2 Sensitive     CEFAZOLIN* <=4 Not Reportable      * For infections other than uncomplicated UTIcaused by E. coli, K. pneumoniae or P. mirabilis:Cefazolin is resistant if MIC > or = 8 mcg/mL.(Distinguishing susceptible versus intermediatefor isolates with MIC < or = 4 mcg/mL requiresadditional testing.)For uncomplicated UTI caused by E. coli,K. pneumoniae or P. mirabilis: Cefazolin issusceptible if MIC <32 mcg/mL and predictssusceptible to the oral agents cefaclor, cefdinir,cefpodoxime, cefprozil, cefuroxime, cephalexinand loracarbef.    CEFEPIME <=1 Sensitive     CEFTRIAXONE <=1 Sensitive     CIPROFLOXACIN <=0.25 Sensitive     LEVOFLOXACIN <=0.12 Sensitive     ERTAPENEM <=0.5 Sensitive     GENTAMICIN <=1 Sensitive     IMIPENEM <=0.25 Sensitive     NITROFURANTOIN <=16 Sensitive     PIP/TAZO <=4 Sensitive     TOBRAMYCIN <=1 Sensitive     TRIMETH/SULFA* >=320 Resistant      * For infections other than uncomplicated UTIcaused by E. coli, K. pneumoniae or P. mirabilis:Cefazolin is resistant if MIC > or = 8 mcg/mL.(Distinguishing susceptible versus intermediatefor isolates with MIC < or = 4 mcg/mL requiresadditional testing.)For uncomplicated UTI caused by E. coli,K. pneumoniae or P. mirabilis: Cefazolin issusceptible if MIC <32 mcg/mL and predictssusceptible  to the oral agents cefaclor, cefdinir,cefpodoxime, cefprozil, cefuroxime, cephalexinand loracarbef.Legend:S = Susceptible  I = IntermediateR = Resistant  NS = Not susceptible* = Not tested  NR = Not reported**NN = See antimicrobic comments  Urinalysis, Routine w reflex microscopic     Status: Abnormal   Collection Time: 06/03/20  9:33 AM  Result Value Ref Range   Color, Urine Orange (A) Yellow;Lt. Yellow;Straw;Dark Yellow;Amber;Green;Red;Brown   APPearance CLEAR Clear;Turbid;Slightly Cloudy;Cloudy   Specific Gravity, Urine 1.020 1.000 - 1.030   pH 5.0 5.0 - 8.0   Total Protein, Urine Color Interference (A) Negative    Urine Glucose Color Interference (A) Negative   Ketones, ur Color Interference (A) Negative   Bilirubin Urine Color Interference (A) Negative   Hgb urine dipstick Color Interference (A) Negative   Urobilinogen, UA Color Interference (A) 0.0 - 1.0   Leukocytes,Ua Color Interference (A) Negative   Nitrite Color Interference (A) Negative   WBC, UA TNTC(>50/hpf) (A) 0-2/hpf   RBC / HPF 7-10/hpf (A) 0-2/hpf   Squamous Epithelial / LPF Rare(0-4/hpf) Rare(0-4/hpf)   Bacteria, UA Few(10-50/hpf) (A) None  POCT Urinalysis Dipstick     Status: Abnormal   Collection Time: 06/03/20 10:07 AM  Result Value Ref Range   Color, UA red     Comment: Pt took Azo   Clarity, UA cloudy    Glucose, UA Positive (A) Negative   Bilirubin, UA moderate    Ketones, UA positive    Spec Grav, UA 1.010 1.010 - 1.025   Blood, UA moderate    pH, UA 5.0 5.0 - 8.0   Protein, UA Positive (A) Negative   Urobilinogen, UA >=8.0 (A) 0.2 or 1.0 E.U./dL   Nitrite, UA positive    Leukocytes, UA Large (3+) (A) Negative   Appearance     Odor    Urine Culture     Status: Abnormal   Collection Time: 06/26/20 10:47 AM   Specimen: Urine  Result Value Ref Range   MICRO NUMBER: [id number]    SPECIMEN QUALITY: Adequate    Sample Source NOT GIVEN    STATUS: FINAL    ISOLATE 1: Escherichia coli (A)     Comment: Greater than 100,000 CFU/mL of Escherichia coli      Susceptibility   Escherichia coli - URINE CULTURE, REFLEX    AMOX/CLAVULANIC <=2 Sensitive     AMPICILLIN <=2 Sensitive     AMPICILLIN/SULBACTAM <=2 Sensitive     CEFAZOLIN* <=4 Not Reportable      * For infections other than uncomplicated UTIcaused by E. coli, K. pneumoniae or P. mirabilis:Cefazolin is resistant if MIC > or = 8 mcg/mL.(Distinguishing susceptible versus intermediatefor isolates with MIC < or = 4 mcg/mL requiresadditional testing.)For uncomplicated UTI caused by E. coli,K. pneumoniae or P. mirabilis: Cefazolin issusceptible if MIC <32 mcg/mL and  predictssusceptible to the oral agents cefaclor, cefdinir,cefpodoxime, cefprozil, cefuroxime, cephalexinand loracarbef.    CEFEPIME <=1 Sensitive     CEFTRIAXONE <=1 Sensitive     CIPROFLOXACIN <=0.25 Sensitive     LEVOFLOXACIN <=0.12 Sensitive     ERTAPENEM <=0.5 Sensitive     GENTAMICIN <=1 Sensitive     IMIPENEM <=0.25 Sensitive     NITROFURANTOIN <=16 Sensitive     PIP/TAZO <=4 Sensitive     TOBRAMYCIN <=1 Sensitive     TRIMETH/SULFA* >=320 Resistant      * For infections other than uncomplicated UTIcaused by E. coli, K. pneumoniae or P. mirabilis:Cefazolin is resistant if MIC > or = 8 mcg/mL.(Distinguishing susceptible versus  intermediatefor isolates with MIC < or = 4 mcg/mL requiresadditional testing.)For uncomplicated UTI caused by E. coli,K. pneumoniae or P. mirabilis: Cefazolin issusceptible if MIC <32 mcg/mL and predictssusceptible to the oral agents cefaclor, cefdinir,cefpodoxime, cefprozil, cefuroxime, cephalexinand loracarbef.Legend:S = Susceptible  I = IntermediateR = Resistant  NS = Not susceptible* = Not tested  NR = Not reported**NN = See antimicrobic comments  Urinalysis     Status: Abnormal   Collection Time: 06/26/20 10:47 AM  Result Value Ref Range   Color, Urine YELLOW YELLOW   APPearance TURBID (A) CLEAR   Specific Gravity, Urine 1.019 1.001 - 1.035   pH 6.0 5.0 - 8.0   Glucose, UA NEGATIVE NEGATIVE   Bilirubin Urine NEGATIVE NEGATIVE   Ketones, ur NEGATIVE NEGATIVE   Hgb urine dipstick 2+ (A) NEGATIVE   Protein, ur 2+ (A) NEGATIVE   Nitrite POSITIVE (A) NEGATIVE   Leukocytes,Ua 3+ (A) NEGATIVE  CBC with Differential     Status: Abnormal   Collection Time: 06/26/20  2:56 PM  Result Value Ref Range   WBC 5.3 4.0 - 10.5 K/uL   RBC 4.54 3.87 - 5.11 Mil/uL   Hemoglobin 12.8 12.0 - 15.0 g/dL   HCT 39.9 36.0 - 46.0 %   MCV 87.9 78.0 - 100.0 fl   MCHC 32.2 30.0 - 36.0 g/dL   RDW 15.5 11.5 - 15.5 %   Platelets 228.0 150.0 - 400.0 K/uL   Neutrophils Relative % 60.0  43.0 - 77.0 %   Lymphocytes Relative 24.1 12.0 - 46.0 %   Monocytes Relative 13.1 (H) 3.0 - 12.0 %   Eosinophils Relative 1.8 0.0 - 5.0 %   Basophils Relative 1.0 0.0 - 3.0 %   Neutro Abs 3.2 1.4 - 7.7 K/uL   Lymphs Abs 1.3 0.7 - 4.0 K/uL   Monocytes Absolute 0.7 0.1 - 1.0 K/uL   Eosinophils Absolute 0.1 0.0 - 0.7 K/uL   Basophils Absolute 0.1 0.0 - 0.1 K/uL  Lipid panel     Status: Abnormal   Collection Time: 06/26/20  2:56 PM  Result Value Ref Range   Cholesterol 115 0 - 200 mg/dL    Comment: ATP III Classification       Desirable:  < 200 mg/dL               Borderline High:  200 - 239 mg/dL          High:  > = 240 mg/dL   Triglycerides 72.0 0.0 - 149.0 mg/dL    Comment: Normal:  <150 mg/dLBorderline High:  150 - 199 mg/dL   HDL 34.90 (L) >39.00 mg/dL   VLDL 14.4 0.0 - 40.0 mg/dL   LDL Cholesterol 65 0 - 99 mg/dL   Total CHOL/HDL Ratio 3     Comment:                Men          Women1/2 Average Risk     3.4          3.3Average Risk          5.0          4.42X Average Risk          9.6          7.13X Average Risk          15.0          11.0  NonHDL 79.65     Comment: NOTE:  Non-HDL goal should be 30 mg/dL higher than patient's LDL goal (i.e. LDL goal of < 70 mg/dL, would have non-HDL goal of < 100 mg/dL)  Comprehensive metabolic panel     Status: Abnormal   Collection Time: 06/26/20  2:56 PM  Result Value Ref Range   Sodium 141 135 - 145 mEq/L   Potassium 4.2 3.5 - 5.1 mEq/L   Chloride 106 96 - 112 mEq/L   CO2 26 19 - 32 mEq/L   Glucose, Bld 85 70 - 99 mg/dL   BUN 28 (H) 6 - 23 mg/dL   Creatinine, Ser 1.22 (H) 0.40 - 1.20 mg/dL   Total Bilirubin 0.7 0.2 - 1.2 mg/dL   Alkaline Phosphatase 104 39 - 117 U/L   AST 14 0 - 37 U/L   ALT 11 0 - 35 U/L   Total Protein 7.0 6.0 - 8.3 g/dL   Albumin 4.1 3.5 - 5.2 g/dL   GFR 41.23 (L) >60.00 mL/min    Comment: Calculated using the CKD-EPI Creatinine Equation (2021)   Calcium 9.4 8.4 - 10.5 mg/dL  Uric acid      Status: Abnormal   Collection Time: 06/26/20  2:56 PM  Result Value Ref Range   Uric Acid, Serum 8.0 (H) 2.4 - 7.0 mg/dL  Vitamin D 25 hydroxy     Status: None   Collection Time: 06/26/20  2:56 PM  Result Value Ref Range   VITD 53.49 30.00 - 100.00 ng/mL  Vitamin D (25 hydroxy)     Status: None   Collection Time: 07/14/20 11:16 AM  Result Value Ref Range   VITD 43.31 30.00 - 100.00 ng/mL  CBC with Differential/Platelet     Status: Abnormal   Collection Time: 07/14/20 11:16 AM  Result Value Ref Range   WBC 6.1 4.0 - 10.5 K/uL   RBC 4.04 3.87 - 5.11 Mil/uL   Hemoglobin 11.6 (L) 12.0 - 15.0 g/dL   HCT 35.5 (L) 36.0 - 46.0 %   MCV 87.9 78.0 - 100.0 fl   MCHC 32.6 30.0 - 36.0 g/dL   RDW 15.8 (H) 11.5 - 15.5 %   Platelets 216.0 150.0 - 400.0 K/uL   Neutrophils Relative % 64.2 43.0 - 77.0 %   Lymphocytes Relative 23.7 12.0 - 46.0 %   Monocytes Relative 8.5 3.0 - 12.0 %   Eosinophils Relative 3.1 0.0 - 5.0 %   Basophils Relative 0.5 0.0 - 3.0 %   Neutro Abs 3.9 1.4 - 7.7 K/uL   Lymphs Abs 1.4 0.7 - 4.0 K/uL   Monocytes Absolute 0.5 0.1 - 1.0 K/uL   Eosinophils Absolute 0.2 0.0 - 0.7 K/uL   Basophils Absolute 0.0 0.0 - 0.1 K/uL  Lipid panel     Status: Abnormal   Collection Time: 07/14/20 11:16 AM  Result Value Ref Range   Cholesterol 93 0 - 200 mg/dL    Comment: ATP III Classification       Desirable:  < 200 mg/dL               Borderline High:  200 - 239 mg/dL          High:  > = 240 mg/dL   Triglycerides 75.0 0.0 - 149.0 mg/dL    Comment: Normal:  <150 mg/dLBorderline High:  150 - 199 mg/dL   HDL 33.50 (L) >39.00 mg/dL   VLDL 15.0 0.0 - 40.0 mg/dL   LDL Cholesterol 45 0 - 99  mg/dL   Total CHOL/HDL Ratio 3     Comment:                Men          Women1/2 Average Risk     3.4          3.3Average Risk          5.0          4.42X Average Risk          9.6          7.13X Average Risk          15.0          11.0                       NonHDL 59.76     Comment: NOTE:  Non-HDL goal  should be 30 mg/dL higher than patient's LDL goal (i.e. LDL goal of < 70 mg/dL, would have non-HDL goal of < 100 mg/dL)  Comprehensive metabolic panel     Status: Abnormal   Collection Time: 07/14/20 11:16 AM  Result Value Ref Range   Sodium 142 135 - 145 mEq/L   Potassium 4.2 3.5 - 5.1 mEq/L   Chloride 107 96 - 112 mEq/L   CO2 28 19 - 32 mEq/L   Glucose, Bld 99 70 - 99 mg/dL   BUN 31 (H) 6 - 23 mg/dL   Creatinine, Ser 1.30 (H) 0.40 - 1.20 mg/dL   Total Bilirubin 0.9 0.2 - 1.2 mg/dL   Alkaline Phosphatase 88 39 - 117 U/L   AST 14 0 - 37 U/L   ALT 12 0 - 35 U/L   Total Protein 6.4 6.0 - 8.3 g/dL   Albumin 4.0 3.5 - 5.2 g/dL   GFR 38.20 (L) >60.00 mL/min    Comment: Calculated using the CKD-EPI Creatinine Equation (2021)   Calcium 9.6 8.4 - 10.5 mg/dL  Urine Culture     Status: Abnormal   Collection Time: 07/17/20  2:06 PM   Specimen: Urine  Result Value Ref Range   MICRO NUMBER: [id number]    SPECIMEN QUALITY: Adequate    Sample Source NOT GIVEN    STATUS: FINAL    ISOLATE 1: Streptococcus agalactiae (A)     Comment: Greater than 100,000 CFU/mL of Group B Streptococcus isolated Beta-hemolytic streptococci are predictably susceptible to Penicillin and other beta-lactams. Susceptibility testing not routinely performed. Please contact the laboratory within 3 days if  susceptibility testing is desired. Erythromycin and clindamycin are not recommended for treatment of urinary tract infections, but clindamycin may be useful for treatment in penicillin allergic patients for rectovaginal colonization or for intrapartum  prophylaxis if indicated.    Objective  Body mass index is 38.53 kg/m. Wt Readings from Last 3 Encounters:  06/26/20 246 lb (111.6 kg)  06/03/20 245 lb 6.4 oz (111.3 kg)  05/28/20 246 lb (111.6 kg)   Temp Readings from Last 3 Encounters:  06/26/20 (!) 97.3 F (36.3 C) (Oral)  06/03/20 97.6 F (36.4 C)  01/08/20 98 F (36.7 C) (Oral)   BP Readings from Last 3  Encounters:  06/26/20 124/80  06/03/20 110/82  05/28/20 116/74   Pulse Readings from Last 3 Encounters:  06/26/20 86  06/03/20 95  05/28/20 70    Physical Exam Vitals and nursing note reviewed.  Constitutional:      Appearance: Normal appearance. She is well-developed and well-groomed. She is obese.  HENT:  Head: Normocephalic and atraumatic.  Eyes:     Conjunctiva/sclera: Conjunctivae normal.     Pupils: Pupils are equal, round, and reactive to light.  Cardiovascular:     Rate and Rhythm: Normal rate and regular rhythm.     Heart sounds: Normal heart sounds. No murmur heard.   Abdominal:     General: Abdomen is flat. Bowel sounds are normal.  Musculoskeletal:     Right knee: Tenderness present.     Comments: Posterior right knee pain mild to moderate  Skin:    General: Skin is warm and dry.  Neurological:     General: No focal deficit present.     Mental Status: She is alert and oriented to person, place, and time. Mental status is at baseline.     Gait: Gait normal.     Comments: Uses cane  Psychiatric:        Attention and Perception: Attention and perception normal.        Mood and Affect: Mood and affect normal.        Speech: Speech normal.        Behavior: Behavior normal. Behavior is cooperative.        Thought Content: Thought content normal.        Cognition and Memory: Cognition and memory normal.        Judgment: Judgment normal.     Assessment  Plan  Painful urination - Plan: Urine Culture, Urinalysis History of recurrent UTI (urinary tract infection) - Plan: Urine Culture, Urinalysis 5/622 culture + e coli Rx cipro 500 mg bid  Called back 07/16/20 sx's still present and tx'ed cipro 250 mg bid  Urine culture repeat + GBS 07/17/20 Consider urogyn in future   Essential hypertension controlled with  ckd 3 with elevated creatinine- Plan: Comprehensive metabolic panel, Lipid panel, CBC with Differential/Platelet Take lasix 40 mg qd prn not qd, on  torprol xl 100 mg qd, spironlactone 12.5 mg qd, norvasc 5 mg qd  BP controlled today   Gout, unspecified cause, unspecified chronicity, unspecified site - Plan: Uric acid continue allopurinol 100 mg qd   Vitamin D deficiency - Plan: Vitamin D (25 hydroxy)  Chronic low back pain with radiculopathy, chronic knee pain today right - Plan: HYDROcodone-acetaminophen (NORCO/VICODIN) 5-325 MG tablet Pain contract signed 06/26/20 vicodan 5-325 bid prn #60 no refills  Edema, unspecified type  Seen above htn info   HM Flu shot utd  prevnar utd  pna 23 due had 12/03/13  Tdap per prior PCP had 12/19/16  consider shingrix in future covid vx 3/3 consider 4th covid dose   Out of age window pap s/p hysterectomy? What is left in I.e ovaries possibly b/l ovaries out  [place]   Colonoscopy8/18/18 negative Dr. Carlean Purl  dexa 03/05/15 normal   Former [place] 20-40 max 1ppd  CT ab pelvis 03/19/19 IMPRESSION: 1. No acute findings or explanation for the patient's symptoms. No evidence of malignancy. 2. Enlarging bilateral renal cysts. 3. 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 Recommend followup by ultrasound in 3 years. This recommendation follows ACR consensus guidelines: White Paper of the ACR Incidental Findings Committee II on Vascular Findings. J Am Coll Radiol 2013; 10:789-794. Aortic Atherosclerosis (ICD10-I70.0). 4. Additional incidental findings including lower lumbar spondylosis, small umbilical hernia containing only fat and probable pelvic floor laxity. Adrenals/Urinary Tract: Stable mild prominence of both adrenal glands without focal mass lesion. Enlarging bilateral renal cysts. No evidence of enhancing renal mass, urinary tract calculus or  hydronephrosis. No focal bladder abnormality.   Prior PCPs Dr. Lacinda Axon and Dr. Delfina Redwood in [facility]  Cards Dr. Tamala Julian est ob/gyn  Provider: Dr. Olivia Mackie McLean-[facility]-Internal  Medicine

## 2020-06-26 NOTE — Patient Instructions (Addendum)
AZO cranberry pills (nature made) daily with D Mannose -supplements over the counter to prevent UTI  Consider 4th covid vaccine 5-6 months after 3rd   coppertone compression socks-warlmart   Varicose Veins Varicose veins are veins that have become enlarged, bulged, and twisted. They most often appear in the legs. What are the causes? This condition is caused by damage to the valves in the vein. These valves help blood return to your heart. When they are damaged and they stop working properly, blood may flow backward and back up in the veins near the skin, causing the veins to get larger and appear twisted. The condition can result from any issue that causes blood to back up, like pregnancy, prolonged standing, or obesity. What increases the risk? This condition is more likely to develop in people who are:  On their feet a lot.  Pregnant.  Overweight. What are the signs or symptoms? Symptoms of this condition include:  Bulging, twisted, and bluish veins.  A feeling of heaviness. This may be worse at the end of the day.  Leg pain. This may be worse at the end of the day.  Swelling in the leg.  Changes in skin color over the veins. How is this diagnosed? This condition may be diagnosed based on your symptoms, a physical exam, and an ultrasound test. How is this treated? Treatment for this condition may involve:  Avoiding sitting or standing in one position for long periods of time.  Wearing compression stockings. These stockings help to prevent blood clots and reduce swelling in the legs.  Raising (elevating) the legs when resting.  Losing weight.  Exercising regularly. If you have persistent symptoms or want to improve the way your varicose veins look, you may choose to have a procedure to close the varicose veins off or to remove them. Treatments to close off the veins include:  Sclerotherapy. In this treatment, a solution is injected into a vein to close it  off.  Laser treatment. In this treatment, the vein is heated with a laser to close it off.  Radiofrequency vein ablation. In this treatment, an electrical current produced by radio waves is used to close off the vein. Treatments to remove the veins include:  Phlebectomy. In this treatment, the veins are removed through small incisions made over the veins.  Vein ligation and stripping. In this treatment, incisions are made over the veins. The veins are then removed after being tied (ligated) with stitches (sutures). Follow these instructions at home: Activity  Walk as much as possible. Walking increases blood flow. This helps blood return to the heart and takes pressure off your veins. It also increases your cardiovascular strength.  Follow your health care provider's instructions about exercising.  Do not stand or sit in one position for a long period of time.  Do not sit with your legs crossed.  Rest with your legs raised during the day. General instructions  Follow any diet instructions given to you by your health care provider.  Wear compression stockings as directed by your health care provider. Do not wear other kinds of tight clothing around your legs, pelvis, or waist.  Elevate your legs at night to above the level of your heart.  If you get a cut in the skin over the varicose vein and the vein bleeds: ? Lie down with your leg raised. ? Apply firm pressure to the cut with a clean cloth until the bleeding stops. ? Place a bandage (dressing) on the cut.  Contact a health care provider if:  The skin around your varicose veins starts to break down.  You have pain, redness, tenderness, or hard swelling over a vein.  You are uncomfortable because of pain.  You get a cut in the skin over a varicose vein and it will not stop bleeding. Summary  Varicose veins are veins that have become enlarged, bulged, and twisted. They most often appear in the legs.  This condition is  caused by damage to the valves in the vein. These valves help blood return to your heart.  Treatment for this condition includes frequent movements, wearing compression stockings, losing weight, and exercising regularly. In some cases, procedures are done to close off or remove the veins.  Treatment for this condition may include wearing compression stockings, elevating the legs, losing weight, and engaging in regular activity. In some cases, procedures are done to close off or remove the veins. This information is not intended to replace advice given to you by your health care provider. Make sure you discuss any questions you have with your health care provider. Document Revised: 06/20/2019 Document Reviewed: 06/20/2019 Elsevier Patient Education  [place].

## 2020-06-28 LAB — URINE CULTURE
MICRO NUMBER:: 11860012
SPECIMEN QUALITY:: ADEQUATE

## 2020-06-28 LAB — URINALYSIS
Bilirubin Urine: NEGATIVE
Glucose, UA: NEGATIVE
Ketones, ur: NEGATIVE
Nitrite: POSITIVE — AB
Specific Gravity, Urine: 1.019 (ref 1.001–1.035)
pH: 6 (ref 5.0–8.0)

## 2020-06-30 ENCOUNTER — Other Ambulatory Visit: Payer: Self-pay | Admitting: Internal Medicine

## 2020-06-30 ENCOUNTER — Encounter: Payer: Self-pay | Admitting: Internal Medicine

## 2020-06-30 ENCOUNTER — Other Ambulatory Visit: Payer: Self-pay

## 2020-06-30 DIAGNOSIS — D72821 Monocytosis (symptomatic): Secondary | ICD-10-CM

## 2020-06-30 DIAGNOSIS — N3000 Acute cystitis without hematuria: Secondary | ICD-10-CM

## 2020-06-30 DIAGNOSIS — R944 Abnormal results of kidney function studies: Secondary | ICD-10-CM

## 2020-06-30 MED ORDER — CIPROFLOXACIN HCL 500 MG PO TABS: 500.0000 mg | ORAL_TABLET | Freq: Two times a day (BID) | ORAL | 0 refills | Status: AC

## 2020-07-07 ENCOUNTER — Other Ambulatory Visit: Payer: Self-pay | Admitting: *Deleted

## 2020-07-07 MED ORDER — DIGOXIN 125 MCG PO TABS
0.0625 mg | ORAL_TABLET | Freq: Every day | ORAL | 3 refills | Status: DC
Start: 2020-07-07 — End: 2021-04-13

## 2020-07-11 ENCOUNTER — Other Ambulatory Visit: Payer: Self-pay | Admitting: Interventional Cardiology

## 2020-07-13 ENCOUNTER — Other Ambulatory Visit: Payer: Self-pay

## 2020-07-13 DIAGNOSIS — E876 Hypokalemia: Secondary | ICD-10-CM

## 2020-07-13 MED ORDER — POTASSIUM CHLORIDE CRYS ER 10 MEQ PO TBCR: 10.0000 meq | EXTENDED_RELEASE_TABLET | Freq: Every day | ORAL | 3 refills | Status: DC

## 2020-07-14 ENCOUNTER — Other Ambulatory Visit: Payer: Self-pay

## 2020-07-14 ENCOUNTER — Other Ambulatory Visit (INDEPENDENT_AMBULATORY_CARE_PROVIDER_SITE_OTHER): Payer: Medicare HMO

## 2020-07-14 DIAGNOSIS — E278 Other specified disorders of adrenal gland: Secondary | ICD-10-CM

## 2020-07-14 DIAGNOSIS — E559 Vitamin D deficiency, unspecified: Secondary | ICD-10-CM

## 2020-07-14 DIAGNOSIS — I10 Essential (primary) hypertension: Secondary | ICD-10-CM | POA: Diagnosis not present

## 2020-07-14 DIAGNOSIS — D72821 Monocytosis (symptomatic): Secondary | ICD-10-CM

## 2020-07-14 DIAGNOSIS — R944 Abnormal results of kidney function studies: Secondary | ICD-10-CM

## 2020-07-14 LAB — COMPREHENSIVE METABOLIC PANEL
ALT: 12 U/L (ref 0–35)
AST: 14 U/L (ref 0–37)
Albumin: 4 g/dL (ref 3.5–5.2)
Alkaline Phosphatase: 88 U/L (ref 39–117)
BUN: 31 mg/dL — ABNORMAL HIGH (ref 6–23)
CO2: 28 mEq/L (ref 19–32)
Calcium: 9.6 mg/dL (ref 8.4–10.5)
Chloride: 107 mEq/L (ref 96–112)
Creatinine, Ser: 1.3 mg/dL — ABNORMAL HIGH (ref 0.40–1.20)
GFR: 38.2 mL/min — ABNORMAL LOW (ref >60.00)
Glucose, Bld: 99 mg/dL (ref 70–99)
Potassium: 4.2 mEq/L (ref 3.5–5.1)
Sodium: 142 mEq/L (ref 135–145)
Total Bilirubin: 0.9 mg/dL (ref 0.2–1.2)
Total Protein: 6.4 g/dL (ref 6.0–8.3)

## 2020-07-14 LAB — LIPID PANEL
Cholesterol: 93 mg/dL (ref 0–200)
HDL: 33.5 mg/dL — ABNORMAL LOW (ref >39.00)
LDL Cholesterol: 45 mg/dL (ref 0–99)
NonHDL: 59.76
Total CHOL/HDL Ratio: 3
Triglycerides: 75 mg/dL (ref 0.0–149.0)
VLDL: 15 mg/dL (ref 0.0–40.0)

## 2020-07-14 LAB — CBC WITH DIFFERENTIAL/PLATELET
Basophils Absolute: 0 10*3/uL (ref 0.0–0.1)
Basophils Relative: 0.5 % (ref 0.0–3.0)
Eosinophils Absolute: 0.2 10*3/uL (ref 0.0–0.7)
Eosinophils Relative: 3.1 % (ref 0.0–5.0)
HCT: 35.5 % — ABNORMAL LOW (ref 36.0–46.0)
Hemoglobin: 11.6 g/dL — ABNORMAL LOW (ref 12.0–15.0)
Lymphocytes Relative: 23.7 % (ref 12.0–46.0)
Lymphs Abs: 1.4 10*3/uL (ref 0.7–4.0)
MCHC: 32.6 g/dL (ref 30.0–36.0)
MCV: 87.9 fl (ref 78.0–100.0)
Monocytes Absolute: 0.5 10*3/uL (ref 0.1–1.0)
Monocytes Relative: 8.5 % (ref 3.0–12.0)
Neutro Abs: 3.9 10*3/uL (ref 1.4–7.7)
Neutrophils Relative %: 64.2 % (ref 43.0–77.0)
Platelets: 216 10*3/uL (ref 150.0–400.0)
RBC: 4.04 Mil/uL (ref 3.87–5.11)
RDW: 15.8 % — ABNORMAL HIGH (ref 11.5–15.5)
WBC: 6.1 10*3/uL (ref 4.0–10.5)

## 2020-07-14 LAB — VITAMIN D 25 HYDROXY (VIT D DEFICIENCY, FRACTURES): VITD: 43.31 ng/mL (ref 30.00–100.00)

## 2020-07-14 NOTE — Addendum Note (Signed)
Addended by: Neta Ehlers on: 07/14/2020 12:22 PM   Modules accepted: Orders

## 2020-07-15 NOTE — Addendum Note (Signed)
Addended by: Leeanne Rio on: 07/15/2020 11:22 AM   Modules accepted: Orders

## 2020-07-16 ENCOUNTER — Telehealth: Payer: Self-pay | Admitting: Internal Medicine

## 2020-07-16 DIAGNOSIS — R350 Frequency of micturition: Secondary | ICD-10-CM

## 2020-07-16 NOTE — Telephone Encounter (Signed)
Please advise 

## 2020-07-16 NOTE — Telephone Encounter (Signed)
Urine culture 06/26/20 grew UTI  Cancel Dr. Leo Grosser appt  Need urine culture repeat Urine culture +06/26/20  Ecoli   Can send in Abx

## 2020-07-16 NOTE — Telephone Encounter (Signed)
Pt called and wanted to discuss urination issues. Since last night she has been getting up every hour to urinate and is going through pads. Pt stated that she is not having any other symptoms

## 2020-07-16 NOTE — Telephone Encounter (Signed)
Appointment has been scheduled at [facility].

## 2020-07-17 ENCOUNTER — Other Ambulatory Visit: Payer: Self-pay | Admitting: Internal Medicine

## 2020-07-17 ENCOUNTER — Ambulatory Visit: Payer: Medicare HMO | Admitting: Family Medicine

## 2020-07-17 ENCOUNTER — Other Ambulatory Visit: Payer: Self-pay

## 2020-07-17 ENCOUNTER — Other Ambulatory Visit (INDEPENDENT_AMBULATORY_CARE_PROVIDER_SITE_OTHER): Payer: Medicare HMO

## 2020-07-17 DIAGNOSIS — N3000 Acute cystitis without hematuria: Secondary | ICD-10-CM

## 2020-07-17 DIAGNOSIS — R350 Frequency of micturition: Secondary | ICD-10-CM | POA: Diagnosis not present

## 2020-07-17 MED ORDER — CIPROFLOXACIN HCL 250 MG PO TABS: 250.0000 mg | ORAL_TABLET | Freq: Two times a day (BID) | ORAL | 0 refills | Status: DC

## 2020-07-17 MED ORDER — CIPROFLOXACIN HCL 250 MG PO TABS: 500.0000 mg | ORAL_TABLET | Freq: Two times a day (BID) | ORAL | 0 refills | Status: DC

## 2020-07-17 NOTE — Telephone Encounter (Signed)
Cipro should be 250 mg 1 pill 2x per day x 5 days  Call pharmacy and pt  Thanks

## 2020-07-17 NOTE — Telephone Encounter (Signed)
Patient informed and verbalized understanding.  Appointment cancelled and scheduled to come in for urine culture at 2:00 today.   Urine ordered

## 2020-07-17 NOTE — Addendum Note (Signed)
Addended by: Thressa Sheller on: 07/17/2020 07:56 AM   Modules accepted: Orders

## 2020-07-17 NOTE — Telephone Encounter (Signed)
Does she want urogyn referral?

## 2020-07-17 NOTE — Telephone Encounter (Signed)
Patient declined referral for now.   Patient states the pharmacy put a note on her medication that she already had her Cipro dose for today. Confirmed for the Patient that our office did not write this. Confirmed with Patient that she is to take 2 tablets twice a day until this is complete. Patient verbalized understanding

## 2020-07-17 NOTE — Telephone Encounter (Signed)
Left message to return call 

## 2020-07-17 NOTE — Telephone Encounter (Signed)
Informed by Dr Olivia Mackie McLean-Scocuzza that direction auto-populated in the computer wrong.   Patient is to take 1 pill (250 mg) twice daily for 5 days.   Patient informed and verbalized understanding.

## 2020-07-19 LAB — URINE CULTURE
MICRO NUMBER:: 11943795
SPECIMEN QUALITY:: ADEQUATE

## 2020-07-22 DIAGNOSIS — M25569 Pain in unspecified knee: Secondary | ICD-10-CM | POA: Insufficient documentation

## 2020-07-22 DIAGNOSIS — G8929 Other chronic pain: Secondary | ICD-10-CM

## 2020-07-22 HISTORY — DX: Other chronic pain: G89.29

## 2020-07-27 ENCOUNTER — Other Ambulatory Visit: Payer: Self-pay | Admitting: Emergency Medicine

## 2020-07-27 DIAGNOSIS — N3001 Acute cystitis with hematuria: Secondary | ICD-10-CM

## 2020-07-27 MED ORDER — AMOXICILLIN-POT CLAVULANATE 875-125 MG PO TABS: 1.0000 | ORAL_TABLET | Freq: Two times a day (BID) | ORAL | 0 refills | Status: AC

## 2020-07-27 NOTE — Progress Notes (Signed)
Received call from answering service. New antibiotic prescription x cystitis not sent to pharmacy of record. Prescription x Augmentin sent. Dr. Mitchel Honour

## 2020-07-28 ENCOUNTER — Telehealth: Payer: Self-pay

## 2020-07-28 NOTE — Telephone Encounter (Signed)
Please order and schedule urine culture she has been on antibiotics 2x for UTI  Do not want to Rx this yet  I think she needs to see urogynecology though she is on maternity leave until 09/2020 will rec urology agreeable to referral?

## 2020-07-28 NOTE — Telephone Encounter (Signed)
Patient called and spoke to Access Nurse requesting an antibiotic for bacteria in her urine.

## 2020-07-28 NOTE — Telephone Encounter (Signed)
Left message to return call. Medication has been sent in last night

## 2020-07-29 NOTE — Telephone Encounter (Signed)
Patient informed and verbalized understanding.  Will discuss referral at 08/07/20 appointment

## 2020-08-05 ENCOUNTER — Other Ambulatory Visit: Payer: Self-pay

## 2020-08-05 ENCOUNTER — Other Ambulatory Visit: Payer: Medicare HMO

## 2020-08-05 ENCOUNTER — Other Ambulatory Visit (INDEPENDENT_AMBULATORY_CARE_PROVIDER_SITE_OTHER): Payer: Medicare HMO

## 2020-08-05 DIAGNOSIS — I10 Essential (primary) hypertension: Secondary | ICD-10-CM

## 2020-08-05 DIAGNOSIS — E278 Other specified disorders of adrenal gland: Secondary | ICD-10-CM

## 2020-08-05 DIAGNOSIS — N179 Acute kidney failure, unspecified: Secondary | ICD-10-CM | POA: Diagnosis not present

## 2020-08-05 DIAGNOSIS — D72821 Monocytosis (symptomatic): Secondary | ICD-10-CM

## 2020-08-05 DIAGNOSIS — R944 Abnormal results of kidney function studies: Secondary | ICD-10-CM

## 2020-08-07 ENCOUNTER — Encounter: Payer: Self-pay | Admitting: Internal Medicine

## 2020-08-07 ENCOUNTER — Ambulatory Visit (INDEPENDENT_AMBULATORY_CARE_PROVIDER_SITE_OTHER): Payer: Medicare HMO | Admitting: Internal Medicine

## 2020-08-07 ENCOUNTER — Other Ambulatory Visit: Payer: Self-pay | Admitting: Internal Medicine

## 2020-08-07 ENCOUNTER — Other Ambulatory Visit: Payer: Self-pay

## 2020-08-07 VITALS — BP 150/120 | HR 94 | Temp 97.9°F | Ht 67.0 in | Wt 246.8 lb

## 2020-08-07 DIAGNOSIS — I10 Essential (primary) hypertension: Secondary | ICD-10-CM | POA: Diagnosis not present

## 2020-08-07 DIAGNOSIS — N390 Urinary tract infection, site not specified: Secondary | ICD-10-CM | POA: Diagnosis not present

## 2020-08-07 DIAGNOSIS — N179 Acute kidney failure, unspecified: Secondary | ICD-10-CM

## 2020-08-07 DIAGNOSIS — N3000 Acute cystitis without hematuria: Secondary | ICD-10-CM | POA: Diagnosis not present

## 2020-08-07 DIAGNOSIS — Z1231 Encounter for screening mammogram for malignant neoplasm of breast: Secondary | ICD-10-CM | POA: Diagnosis not present

## 2020-08-07 DIAGNOSIS — I714 Abdominal aortic aneurysm, without rupture, unspecified: Secondary | ICD-10-CM

## 2020-08-07 NOTE — Patient Instructions (Addendum)
Out of office middle 09/2020 to middle 11/2020   Contact Adult protective services  [employer]   What I would like Vaccines Medications  Allergies  Name of the last doctor primary care, cancer doctor? Past medical history Past surgeries  Mammogram  Bone density  Colonoscopy   Monitor blood pressure <130/<80   Creatinine and GFR are overall kidney function Creatinine close to 1 and GFR >60 Water 55 ounces water daily  Avoid Ibuprofen/Aleve  Probiotic I.e align or culturelle   Elder Abuse and Neglect Elder abuse and neglect refers to any way in which someone physically or emotionally harms an older person or takes advantage of him or her. It also includes neglecting to protect an older person from physical, financial, oremotional harm. What are the different kinds of abuse and neglect? Physical abuse. This includes rough handling, threats with a weapon, throwing objects, pushing, hitting, force-feeding, and improper use of restraints or medicines. Sexual abuse. This involves unwanted sexual contact that is forced or tricked. Emotional abuse. This includes verbal attacks, rejection, humiliation, intimidation, social isolation, or threats. Abuse may also include denying the victim participation in decisions that affect his or her life. Financial abuse. This includes theft, fraud, and improper use of influence to gain control over money or property. Financial abuse is the most common form of elder abuse. Neglect. This is a caretaker's failure or refusal to meet the needs of an older person. This can include failure or refusal to provide food, shelter, clothing, personal hygiene, medical care, and social stimulation. What are the risk factors for abuse and neglect? Elder abuse is more likely to occur if a person: Is alone, isolated, or lonely. Has experienced a recent loss. Is mentally or physically disabled. Cannot manage his or her personal care or finances. Has contact with someone  who abuses alcohol or drugs. Is over 52 years old. The frequency of abuse increases with advancing age. Lives at a care facility with a shortage of beds, too many patients, or not enough staff. Abuse may also occur with caregivers who: Are under great stress. This may be due to financial, marital, or work problems, a recent decline in health, or loss of a loved one. Have a history of mental illness, such as depression, or mental disability. Abuse alcohol or other drugs. How do I know if someone is being abused or neglected? Someone may be going through abuse or neglect if the person: Has physical signs of abuse, such as: Burns. Scars. Bruises. Broken bones. Sores. Rashes. Weight loss. Injury to the genitals. Difficulty walking. Does not have necessary medical and personal items such as medicines, hearing aids, or eyeglasses. Shows behavioral signs of abuse or neglect. The older person may: Move away from or seem afraid of a caregiver. Withdraw socially. Become violent or agitated. Seem depressed. Have nightmares or difficulty sleeping. Lives in an unhealthy or unsafe environment. Shows signs of not being cared for, such as poor hygiene or dirty clothes. How can elder abuse be prevented? If you are a family member or a friend: Try to see the person regularly. Frequent visits will help you determine if the person shows signs of abuse or neglect. Get to know the staff who are taking care of the person. Express any concerns you may have right away. Keep notes of the conversations. Take pictures if there are signs of abuse or poor health standards. Offer to accompany the person to medical appointments, financial institutions, and other important meetings. If you are an older  person: Remain active, avoid isolation, and cultivate a strong support network of family and friends. Be active in taking care of your health problems. Do what you can to remain healthy. Refuse to allow anyone,  even close relatives, to add his or her name to your bank accounts without your clear consent. Avoid signing documents or transferring money or property to anyone without first getting legal advice. Ask for professional help right away if you need help managing your health care, daily living, or finances. Assert your right to be treated with respect and dignity. Ask a family member or friend you trust to represent you as power of attorney in the event that you are not able to. Outline your wishes with advance directives or a living will for medical decisions. What should I do if I think someone is being abused or neglected? If you believe that you or someone you know is in immediate danger, call your local emergency services (911 in the [place] If you or someone you know is being abused or neglected, contact: Adult Protective Services (APS) in the [place] Any health care provider. The police. An ombudsman. This is a person or service you can contact to discuss neglect or abuse claims in long-term care. Medicaid and Medicare fraud units. These teams check into cases of health, medical, and financial abuse or neglect. Seek help right away if: You are being hit. You are being verbally or emotionally abused. You go without food for long periods of time. Also, seek help right away if an older adult you know: Shows signs of not being fed. Is not receiving needed medical care. Is being physically or emotionally abused. What are the treatments for someone who is abused or neglected? Treatment depends on the type of abuse or neglect. It may include: Medical treatment for any injuries. Being seen by a health care provider if there are memory problems (dementia). Support groups if the older person seems lonely, anxious, or depressed. Counseling for depression, anxiety, and other mental health related issues. Financial guidance to help manage money or property. Where can I get more  information? [place] on Elder Abuse: [employer] National Eldercare Locator: 205-762-0612 or www.eldercare.[place] for [employer] of Elder Abuse: www.preventelderabuse.org Summary Elder abuse and neglect refers to any way in which someone physically or emotionally harms an older person or takes advantage of him or her. The signs of abuse include burns, broken bones, weight loss, bruises, confusion, fear of caregivers, and worsening of physical and mental health. The best way to prevent this abuse is to have frequent contact with the adult. Express any concerns you may have right away. If you or someone you know is a victim of abuse or neglect, seek help right away. This information is not intended to replace advice given to you by your health care provider. Make sure you discuss any questions you have with your healthcare provider. Document Revised: 05/31/2018 Document Reviewed: 03/08/2017 Elsevier Patient Education  2021 [employer].

## 2020-08-07 NOTE — Progress Notes (Signed)
Chief Complaint  Patient presents with   Follow-up   F/u  1. HTN elevated on norvasc 5 mg taking qd not bid and lasix 40 and spironolactone 12.5 mg qd stopped due to aki and on toprol xl 100 mg qd w/o sx's today h/o stroke need to control BP goal <130/<80  2. Recurrent uti pts will need urogyn in [place] if they keep coming back Dr. Sherlene Shams  3. Chronic back pain on norco 5-325 mg but still has Rx pills since 06/2020   Review of Systems  Constitutional:  Negative for weight loss.  HENT:  Negative for hearing loss.   Eyes:  Negative for blurred vision.  Respiratory:  Negative for shortness of breath.   Cardiovascular:  Negative for chest pain.  Musculoskeletal:  Negative for falls and joint pain.  Skin:  Negative for rash.  Psychiatric/Behavioral:  Negative for depression.   Past Medical History:  Diagnosis Date   Allergic rhinitis    Anxiety    Benign neoplasm of breast 2013   Cardiac arrhythmia    Cardiomegaly    Cataract    CHF (congestive heart failure) (HCC)    Compulsive eating patterns    Compulsive overeating    Depression    Diastolic heart failure (HCC)    LVH with free wall thickness 1.4 cm EF 60% Dr. Tamala Julian    Diffuse cystic mastopathy 2013   Edema    Epistaxis    noted 12/20/18    GI bleeding    in ~2018    Gout    Hiatal hernia    HTN (hypertension)    Hyperlipidemia    Lump or mass in breast 2012   OA (osteoarthritis) of knee    with injections   Obesity    OSA (obstructive sleep apnea)    not on cpap   Other abnormal glucose    Peptic stricture of esophagus    Personal history of tobacco use, presenting hazards to health    Sciatica    Sinus infection 2010   Spinal stenosis    lumbar    Stroke ([place])    Unspecified sleep apnea    UTI (urinary tract infection)    e coli 06/2020   Past Surgical History:  Procedure Laterality Date   adenosine cardiolite  1/08   low risk    APPENDECTOMY     BREAST BIOPSY     02/28/11 negative   BREAST  LUMPECTOMY     right x1 ('89) left x2 ('70s, '90)   CARDIAC CATHETERIZATION  2001   normal per pt   COLONOSCOPY  11/02   diverticulosis   COLONOSCOPY  9/06   diverticulosis, hemorhoids   COLONOSCOPY N/A 10/01/2016   Procedure: COLONOSCOPY;  Surgeon: Gatha Mayer, MD;  Location: [facility];  Service: Endoscopy;  Laterality: N/A;   dexa  3/02   normal   EYE SURGERY  2012   cataract   IR CT HEAD LTD  09/01/2018   IR PERCUTANEOUS ART THROMBECTOMY/INFUSION INTRACRANIAL INC DIAG ANGIO  09/01/2018   JOINT REPLACEMENT     knee b/l 2011   knee replacement Right 9/11   R. Dr. Telford Nab   RADIOLOGY WITH ANESTHESIA N/A 09/01/2018   Procedure: IR WITH ANESTHESIA;  Surgeon: Luanne Bras, MD;  Location: [facility];  Service: Radiology;  Laterality: N/A;   sleep study  5/05   TONSILLECTOMY     TOTAL ABDOMINAL HYSTERECTOMY     fibroids, age of 54   TOTAL KNEE  ARTHROPLASTY Left 2012   US TRANSVAGINAL PELVIC MODIFIED  2000, 2001   Family History  Problem Relation Age of Onset   Stroke Father    Hypertension Mother        (a lot of animosity in their relationship)   Heart failure Mother    Gout Other        whole family    Prostate cancer Brother    Cancer Brother        prostate    Hepatitis Brother        Hep C; alcoholism (terminal)   Drug abuse Brother        died 64   Breast cancer Sister    Cancer Sister        breast cancer   Other Brother        drug addiction   Social History   Socioeconomic History   Marital status: Widowed    Spouse name: Not on file   Number of children: 1   Years of education: Not on file   Highest education level: Not on file  Occupational History   Occupation: Retired school principal    Employer: RETIRED  Tobacco Use   Smoking status: Former    Packs/day: 0.50    Years: 20.00    Pack years: 10.00    Types: Cigarettes   Smokeless tobacco: Never   Tobacco comments:    quit approx. 20 years ago  Vaping Use   Vaping Use: Never used   Substance and Sexual Activity   Alcohol use: Yes    Alcohol/week: 0.0 standard drinks    Comment: wine occasional   Drug use: No   Sexual activity: Never  Other Topics Concern   Not on file  Social History Narrative   Widowed x 30+ years as of 12/2018    1 daughter. Retired principal. 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    Social Determinants of Radio broadcast assistant Strain: Low Risk    Difficulty of Paying Living Expenses: Not hard at all  Food Insecurity: No Food Insecurity   Worried About Charity fundraiser in the Last Year: Never true   Arboriculturist in the Last Year: Never true  Transportation Needs: No Transportation Needs   Lack of Transportation (Medical): No   Lack of Transportation (Non-Medical): No  Physical Activity: Not on file  Stress: No Stress Concern Present   Feeling of Stress : Not at all  Social Connections: Unknown   Frequency of Communication with Friends and Family: More than three times a week   Frequency of Social Gatherings with Friends and Family: Not on file   Attends Religious Services: Not on file   Active Member of Clubs or Organizations: Not on file   Attends Archivist Meetings: Not on file   Marital Status: Not on file  Intimate Partner Violence: Not At Risk   Fear of Current or Ex-Partner: No   Emotionally Abused: No   Physically Abused: No   Sexually Abused: No   Current Meds  Medication Sig   allopurinol (ZYLOPRIM) 100 MG tablet TAKE 2 TABLETS BY MOUTH ONCE A DAY   ALPRAZolam (XANAX) 0.25 MG tablet Take 1 tablet (0.25 mg total) by mouth 2 (two) times daily as needed for anxiety.   amLODipine (NORVASC) 5 MG tablet Take 1 tablet (5 mg total) by mouth daily. Take additional 5 mg in pm  if BP>130/>80   apixaban (ELIQUIS) 5 MG TABS tablet Take 1 tablet (5 mg total) by mouth 2 (two) times daily.   Ascorbic Acid (VITAMIN C PO) Take by mouth.   atorvastatin (LIPITOR) 20 MG  tablet TAKE 1 TABLET (20 MG TOTAL) BY MOUTH DAILY. AT NIGHT. STOP/DISCONTINUE LOVASTATIN 20   Cholecalciferol (VITAMIN D3 PO) Take by mouth.   diclofenac Sodium (VOLTAREN) 1 % GEL Apply 2 g topically 4 (four) times daily as needed.   digoxin (LANOXIN) 0.125 MG tablet Take 0.5 tablets (0.0625 mg total) by mouth daily.   DULoxetine (CYMBALTA) 30 MG capsule Take 1 capsule (30 mg total) by mouth daily.   gabapentin (NEURONTIN) 300 MG capsule Take 2 capsules (600 mg total) by mouth at bedtime.   HYDROcodone-acetaminophen (NORCO/VICODIN) 5-325 MG tablet Take 1 tablet by mouth 2 (two) times daily as needed for moderate pain.   Magnesium 250 MG TABS Take 250 mg by mouth daily.   metoprolol succinate (TOPROL-XL) 100 MG 24 hr tablet TAKE 1 TABLET BY MOUTH DAILY WITH OR IMMEDIATLY FOLLOWING A MEAL   [DISCONTINUED] spironolactone (ALDACTONE) 25 MG tablet Take 0.5 tablets (12.5 mg total) by mouth daily. In the am Please make yearly appt with Dr. Tamala Julian for December for future refills. Thank you   Allergies  Allergen Reactions   Buspirone Hcl Other (See Comments)    REACTION: made her sleepy, ? swollen legs   Lisinopril Other (See Comments)    REACTION: dizziness   Prozac [Fluoxetine Hcl] Other (See Comments)    Sleepy as of 06/26/20 states did not make sleepy   Spironolactone     With lasix 40 mg and 12.5 spironolactone caused AKI   Recent Results (from the past 2160 hour(s))  Urine Culture     Status: Abnormal   Collection Time: 06/03/20  9:33 AM   Specimen: Urine  Result Value Ref Range   MICRO NUMBER: [id number]    SPECIMEN QUALITY: Adequate    Sample Source NOT GIVEN    STATUS: FINAL    ISOLATE 1: Escherichia coli (A)     Comment: Greater than 100,000 CFU/mL of Escherichia coli      Susceptibility   Escherichia coli - URINE CULTURE, REFLEX    AMOX/CLAVULANIC <=2 Sensitive     AMPICILLIN <=2 Sensitive     AMPICILLIN/SULBACTAM <=2 Sensitive     CEFAZOLIN* <=4 Not Reportable      * For  infections other than uncomplicated UTIcaused by E. coli, K. pneumoniae or P. mirabilis:Cefazolin is resistant if MIC > or = 8 mcg/mL.(Distinguishing susceptible versus intermediatefor isolates with MIC < or = 4 mcg/mL requiresadditional testing.)For uncomplicated UTI caused by E. coli,K. pneumoniae or P. mirabilis: Cefazolin issusceptible if MIC <32 mcg/mL and predictssusceptible to the oral agents cefaclor, cefdinir,cefpodoxime, cefprozil, cefuroxime, cephalexinand loracarbef.    CEFEPIME <=1 Sensitive     CEFTRIAXONE <=1 Sensitive     CIPROFLOXACIN <=0.25 Sensitive     LEVOFLOXACIN <=0.12 Sensitive     ERTAPENEM <=0.5 Sensitive     GENTAMICIN <=1 Sensitive     IMIPENEM <=0.25 Sensitive     NITROFURANTOIN <=16 Sensitive     PIP/TAZO <=4 Sensitive     TOBRAMYCIN <=1 Sensitive     TRIMETH/SULFA* >=320 Resistant      * For infections other than uncomplicated UTIcaused by E. coli, K. pneumoniae or P. mirabilis:Cefazolin is resistant if MIC > or = 8 mcg/mL.(Distinguishing susceptible versus intermediatefor isolates with MIC < or = 4 mcg/mL requiresadditional testing.)For uncomplicated UTI  caused by E. coli,K. pneumoniae or P. mirabilis: Cefazolin issusceptible if MIC <32 mcg/mL and predictssusceptible to the oral agents cefaclor, cefdinir,cefpodoxime, cefprozil, cefuroxime, cephalexinand loracarbef.Legend:S = Susceptible  I = IntermediateR = Resistant  NS = Not susceptible* = Not tested  NR = Not reported**NN = See antimicrobic comments  Urinalysis, Routine w reflex microscopic     Status: Abnormal   Collection Time: 06/03/20  9:33 AM  Result Value Ref Range   Color, Urine Orange (A) Yellow;Lt. Yellow;Straw;Dark Yellow;Amber;Green;Red;Brown   APPearance CLEAR Clear;Turbid;Slightly Cloudy;Cloudy   Specific Gravity, Urine 1.020 1.000 - 1.030   pH 5.0 5.0 - 8.0   Total Protein, Urine Color Interference (A) Negative   Urine Glucose Color Interference (A) Negative   Ketones, ur Color Interference (A)  Negative   Bilirubin Urine Color Interference (A) Negative   Hgb urine dipstick Color Interference (A) Negative   Urobilinogen, UA Color Interference (A) 0.0 - 1.0   Leukocytes,Ua Color Interference (A) Negative   Nitrite Color Interference (A) Negative   WBC, UA TNTC(>50/hpf) (A) 0-2/hpf   RBC / HPF 7-10/hpf (A) 0-2/hpf   Squamous Epithelial / LPF Rare(0-4/hpf) Rare(0-4/hpf)   Bacteria, UA Few(10-50/hpf) (A) None  POCT Urinalysis Dipstick     Status: Abnormal   Collection Time: 06/03/20 10:07 AM  Result Value Ref Range   Color, UA red     Comment: Pt took Azo   Clarity, UA cloudy    Glucose, UA Positive (A) Negative   Bilirubin, UA moderate    Ketones, UA positive    Spec Grav, UA 1.010 1.010 - 1.025   Blood, UA moderate    pH, UA 5.0 5.0 - 8.0   Protein, UA Positive (A) Negative   Urobilinogen, UA >=8.0 (A) 0.2 or 1.0 E.U./dL   Nitrite, UA positive    Leukocytes, UA Large (3+) (A) Negative   Appearance     Odor    Urine Culture     Status: Abnormal   Collection Time: 06/26/20 10:47 AM   Specimen: Urine  Result Value Ref Range   MICRO NUMBER: [id number]    SPECIMEN QUALITY: Adequate    Sample Source NOT GIVEN    STATUS: FINAL    ISOLATE 1: Escherichia coli (A)     Comment: Greater than 100,000 CFU/mL of Escherichia coli      Susceptibility   Escherichia coli - URINE CULTURE, REFLEX    AMOX/CLAVULANIC <=2 Sensitive     AMPICILLIN <=2 Sensitive     AMPICILLIN/SULBACTAM <=2 Sensitive     CEFAZOLIN* <=4 Not Reportable      * For infections other than uncomplicated UTIcaused by E. coli, K. pneumoniae or P. mirabilis:Cefazolin is resistant if MIC > or = 8 mcg/mL.(Distinguishing susceptible versus intermediatefor isolates with MIC < or = 4 mcg/mL requiresadditional testing.)For uncomplicated UTI caused by E. coli,K. pneumoniae or P. mirabilis: Cefazolin issusceptible if MIC <32 mcg/mL and predictssusceptible to the oral agents cefaclor, cefdinir,cefpodoxime, cefprozil, cefuroxime,  cephalexinand loracarbef.    CEFEPIME <=1 Sensitive     CEFTRIAXONE <=1 Sensitive     CIPROFLOXACIN <=0.25 Sensitive     LEVOFLOXACIN <=0.12 Sensitive     ERTAPENEM <=0.5 Sensitive     GENTAMICIN <=1 Sensitive     IMIPENEM <=0.25 Sensitive     NITROFURANTOIN <=16 Sensitive     PIP/TAZO <=4 Sensitive     TOBRAMYCIN <=1 Sensitive     TRIMETH/SULFA* >=320 Resistant      * For infections other than uncomplicated UTIcaused by E. coli,  K. pneumoniae or P. mirabilis:Cefazolin is resistant if MIC > or = 8 mcg/mL.(Distinguishing susceptible versus intermediatefor isolates with MIC < or = 4 mcg/mL requiresadditional testing.)For uncomplicated UTI caused by E. coli,K. pneumoniae or P. mirabilis: Cefazolin issusceptible if MIC <32 mcg/mL and predictssusceptible to the oral agents cefaclor, cefdinir,cefpodoxime, cefprozil, cefuroxime, cephalexinand loracarbef.Legend:S = Susceptible  I = IntermediateR = Resistant  NS = Not susceptible* = Not tested  NR = Not reported**NN = See antimicrobic comments  Urinalysis     Status: Abnormal   Collection Time: 06/26/20 10:47 AM  Result Value Ref Range   Color, Urine YELLOW YELLOW   APPearance TURBID (A) CLEAR   Specific Gravity, Urine 1.019 1.001 - 1.035   pH 6.0 5.0 - 8.0   Glucose, UA NEGATIVE NEGATIVE   Bilirubin Urine NEGATIVE NEGATIVE   Ketones, ur NEGATIVE NEGATIVE   Hgb urine dipstick 2+ (A) NEGATIVE   Protein, ur 2+ (A) NEGATIVE   Nitrite POSITIVE (A) NEGATIVE   Leukocytes,Ua 3+ (A) NEGATIVE  CBC with Differential     Status: Abnormal   Collection Time: 06/26/20  2:56 PM  Result Value Ref Range   WBC 5.3 4.0 - 10.5 K/uL   RBC 4.54 3.87 - 5.11 Mil/uL   Hemoglobin 12.8 12.0 - 15.0 g/dL   HCT 39.9 36.0 - 46.0 %   MCV 87.9 78.0 - 100.0 fl   MCHC 32.2 30.0 - 36.0 g/dL   RDW 15.5 11.5 - 15.5 %   Platelets 228.0 150.0 - 400.0 K/uL   Neutrophils Relative % 60.0 43.0 - 77.0 %   Lymphocytes Relative 24.1 12.0 - 46.0 %   Monocytes Relative 13.1 (H) 3.0 -  12.0 %   Eosinophils Relative 1.8 0.0 - 5.0 %   Basophils Relative 1.0 0.0 - 3.0 %   Neutro Abs 3.2 1.4 - 7.7 K/uL   Lymphs Abs 1.3 0.7 - 4.0 K/uL   Monocytes Absolute 0.7 0.1 - 1.0 K/uL   Eosinophils Absolute 0.1 0.0 - 0.7 K/uL   Basophils Absolute 0.1 0.0 - 0.1 K/uL  Lipid panel     Status: Abnormal   Collection Time: 06/26/20  2:56 PM  Result Value Ref Range   Cholesterol 115 0 - 200 mg/dL    Comment: ATP III Classification       Desirable:  < 200 mg/dL               Borderline High:  200 - 239 mg/dL          High:  > = 240 mg/dL   Triglycerides 72.0 0.0 - 149.0 mg/dL    Comment: Normal:  <150 mg/dLBorderline High:  150 - 199 mg/dL   HDL 34.90 (L) >39.00 mg/dL   VLDL 14.4 0.0 - 40.0 mg/dL   LDL Cholesterol 65 0 - 99 mg/dL   Total CHOL/HDL Ratio 3     Comment:                Men          Women1/2 Average Risk     3.4          3.3Average Risk          5.0          4.42X Average Risk          9.6          7.13X Average Risk          15.0  11.0                       NonHDL 79.65     Comment: NOTE:  Non-HDL goal should be 30 mg/dL higher than patient's LDL goal (i.e. LDL goal of < 70 mg/dL, would have non-HDL goal of < 100 mg/dL)  Comprehensive metabolic panel     Status: Abnormal   Collection Time: 06/26/20  2:56 PM  Result Value Ref Range   Sodium 141 135 - 145 mEq/L   Potassium 4.2 3.5 - 5.1 mEq/L   Chloride 106 96 - 112 mEq/L   CO2 26 19 - 32 mEq/L   Glucose, Bld 85 70 - 99 mg/dL   BUN 28 (H) 6 - 23 mg/dL   Creatinine, Ser 1.22 (H) 0.40 - 1.20 mg/dL   Total Bilirubin 0.7 0.2 - 1.2 mg/dL   Alkaline Phosphatase 104 39 - 117 U/L   AST 14 0 - 37 U/L   ALT 11 0 - 35 U/L   Total Protein 7.0 6.0 - 8.3 g/dL   Albumin 4.1 3.5 - 5.2 g/dL   GFR 41.23 (L) >60.00 mL/min    Comment: Calculated using the CKD-EPI Creatinine Equation (2021)   Calcium 9.4 8.4 - 10.5 mg/dL  Uric acid     Status: Abnormal   Collection Time: 06/26/20  2:56 PM  Result Value Ref Range   Uric Acid,  Serum 8.0 (H) 2.4 - 7.0 mg/dL  Vitamin D 25 hydroxy     Status: None   Collection Time: 06/26/20  2:56 PM  Result Value Ref Range   VITD 53.49 30.00 - 100.00 ng/mL  Vitamin D (25 hydroxy)     Status: None   Collection Time: 07/14/20 11:16 AM  Result Value Ref Range   VITD 43.31 30.00 - 100.00 ng/mL  CBC with Differential/Platelet     Status: Abnormal   Collection Time: 07/14/20 11:16 AM  Result Value Ref Range   WBC 6.1 4.0 - 10.5 K/uL   RBC 4.04 3.87 - 5.11 Mil/uL   Hemoglobin 11.6 (L) 12.0 - 15.0 g/dL   HCT 35.5 (L) 36.0 - 46.0 %   MCV 87.9 78.0 - 100.0 fl   MCHC 32.6 30.0 - 36.0 g/dL   RDW 15.8 (H) 11.5 - 15.5 %   Platelets 216.0 150.0 - 400.0 K/uL   Neutrophils Relative % 64.2 43.0 - 77.0 %   Lymphocytes Relative 23.7 12.0 - 46.0 %   Monocytes Relative 8.5 3.0 - 12.0 %   Eosinophils Relative 3.1 0.0 - 5.0 %   Basophils Relative 0.5 0.0 - 3.0 %   Neutro Abs 3.9 1.4 - 7.7 K/uL   Lymphs Abs 1.4 0.7 - 4.0 K/uL   Monocytes Absolute 0.5 0.1 - 1.0 K/uL   Eosinophils Absolute 0.2 0.0 - 0.7 K/uL   Basophils Absolute 0.0 0.0 - 0.1 K/uL  Lipid panel     Status: Abnormal   Collection Time: 07/14/20 11:16 AM  Result Value Ref Range   Cholesterol 93 0 - 200 mg/dL    Comment: ATP III Classification       Desirable:  < 200 mg/dL               Borderline High:  200 - 239 mg/dL          High:  > = 240 mg/dL   Triglycerides 75.0 0.0 - 149.0 mg/dL    Comment: Normal:  <150 mg/dLBorderline High:  150 - 199 mg/dL  HDL 33.50 (L) >39.00 mg/dL   VLDL 15.0 0.0 - 40.0 mg/dL   LDL Cholesterol 45 0 - 99 mg/dL   Total CHOL/HDL Ratio 3     Comment:                Men          Women1/2 Average Risk     3.4          3.3Average Risk          5.0          4.42X Average Risk          9.6          7.13X Average Risk          15.0          11.0                       NonHDL 59.76     Comment: NOTE:  Non-HDL goal should be 30 mg/dL higher than patient's LDL goal (i.e. LDL goal of < 70 mg/dL, would have  non-HDL goal of < 100 mg/dL)  Comprehensive metabolic panel     Status: Abnormal   Collection Time: 07/14/20 11:16 AM  Result Value Ref Range   Sodium 142 135 - 145 mEq/L   Potassium 4.2 3.5 - 5.1 mEq/L   Chloride 107 96 - 112 mEq/L   CO2 28 19 - 32 mEq/L   Glucose, Bld 99 70 - 99 mg/dL   BUN 31 (H) 6 - 23 mg/dL   Creatinine, Ser 1.30 (H) 0.40 - 1.20 mg/dL   Total Bilirubin 0.9 0.2 - 1.2 mg/dL   Alkaline Phosphatase 88 39 - 117 U/L   AST 14 0 - 37 U/L   ALT 12 0 - 35 U/L   Total Protein 6.4 6.0 - 8.3 g/dL   Albumin 4.0 3.5 - 5.2 g/dL   GFR 38.20 (L) >60.00 mL/min    Comment: Calculated using the CKD-EPI Creatinine Equation (2021)   Calcium 9.6 8.4 - 10.5 mg/dL  Urine Culture     Status: Abnormal   Collection Time: 07/17/20  2:06 PM   Specimen: Urine  Result Value Ref Range   MICRO NUMBER: [id number]    SPECIMEN QUALITY: Adequate    Sample Source NOT GIVEN    STATUS: FINAL    ISOLATE 1: Streptococcus agalactiae (A)     Comment: Greater than 100,000 CFU/mL of Group B Streptococcus isolated Beta-hemolytic streptococci are predictably susceptible to Penicillin and other beta-lactams. Susceptibility testing not routinely performed. Please contact the laboratory within 3 days if  susceptibility testing is desired. Erythromycin and clindamycin are not recommended for treatment of urinary tract infections, but clindamycin may be useful for treatment in penicillin allergic patients for rectovaginal colonization or for intrapartum  prophylaxis if indicated.   Cortisol-am, blood     Status: None   Collection Time: 08/05/20 10:31 AM  Result Value Ref Range   Cortisol - AM 16.5 mcg/dL    Comment: Reference Range 8 a.m. (7-9 a.m.) Specimen: 4.0-22.0 .   BASIC METABOLIC PANEL WITH GFR     Status: Abnormal   Collection Time: 08/05/20 10:31 AM  Result Value Ref Range   Glucose, Bld 110 (H) 65 - 99 mg/dL    Comment: .            Fasting reference interval . For someone without known  diabetes, a glucose value between 100 and 125  mg/dL is consistent with prediabetes and should be confirmed with a follow-up test. .    BUN 19 7 - 25 mg/dL   Creat 1.14 (H) 0.60 - 0.88 mg/dL    Comment: For patients >25 years of age, the reference limit for Creatinine is approximately 13% higher for people identified as African-American. .    GFR, Est Non African American 45 (L) > OR = 60 mL/min/1.43m2   GFR, Est African American 52 (L) > OR = 60 mL/min/1.39m2   BUN/Creatinine Ratio 17 6 - 22 (calc)   Sodium 145 135 - 146 mmol/L   Potassium 4.3 3.5 - 5.3 mmol/L   Chloride 108 98 - 110 mmol/L   CO2 21 20 - 32 mmol/L   Calcium 9.8 8.6 - 10.4 mg/dL  TEST AUTHORIZATION     Status: None   Collection Time: 08/05/20 10:31 AM  Result Value Ref Range   TEST NAME: BASIC METABOLIC PANEL    TEST CODE: [id number]    CLIENT CONTACT: TOYA W    REPORT ALWAYS MESSAGE SIGNATURE      Comment: . The laboratory testing on this patient was verbally requested or confirmed by the ordering physician or his or her authorized representative after contact with an employee of [employer]. Federal regulations require that we maintain on file written authorization for all laboratory testing.  Accordingly we are asking that the ordering physician or his or her authorized representative sign a copy of this report and promptly return it to the client service representative. . . Signature:____________________________________________________ . Please fax this signed page to [phone] or return it via your [employer] courier.   Microalbumin / creatinine urine ratio     Status: None   Collection Time: 08/07/20  3:56 PM  Result Value Ref Range   Creatinine, Urine 205 20 - 275 mg/dL   Microalb, Ur 3.4 mg/dL    Comment: Reference Range Not established    Microalb Creat Ratio 17 <30 mcg/mg creat    Comment: . The ADA defines abnormalities in albumin excretion as follows: Marland Kitchen Albuminuria  Category        Result (mcg/mg creatinine) . Normal to Mildly increased   <30 Moderately increased         30-299  Severely increased           > OR = 300 . The ADA recommends that at least two of three specimens collected within a 3-6 month period be abnormal before considering a patient to be within a diagnostic category.   Sodium, urine, random     Status: None   Collection Time: 08/07/20  3:56 PM  Result Value Ref Range   Sodium, Ur 66 28 - 272 mmol/L  Urine Culture     Status: None   Collection Time: 08/07/20  3:56 PM   Specimen: Urine  Result Value Ref Range   MICRO NUMBER: [id number]    SPECIMEN QUALITY: Adequate    Sample Source NOT GIVEN    STATUS: FINAL    Result:      Less than 10,000 CFU/mL of single Gram negative organism isolated. No further testing will be performed. If clinically indicated, recollection using a method to minimize contamination, with prompt transfer to Urine Culture Transport Tube, is recommended.  Urinalysis, Routine w reflex microscopic     Status: Abnormal   Collection Time: 08/07/20  4:39 PM  Result Value Ref Range   Color, Urine YELLOW YELLOW   APPearance CLEAR CLEAR   Specific Gravity,  Urine 1.024 1.001 - 1.035   pH 5.5 5.0 - 8.0   Glucose, UA NEGATIVE NEGATIVE   Bilirubin Urine NEGATIVE NEGATIVE   Ketones, ur NEGATIVE NEGATIVE   Hgb urine dipstick NEGATIVE NEGATIVE   Protein, ur TRACE (A) NEGATIVE   Nitrite NEGATIVE NEGATIVE   Leukocytes,Ua NEGATIVE NEGATIVE   WBC, UA NONE SEEN 0 - 5 /HPF   RBC / HPF NONE SEEN 0 - 2 /HPF   Squamous Epithelial / LPF 6-10 (A) < OR = 5 /HPF   Bacteria, UA NONE SEEN NONE SEEN /HPF   Hyaline Cast NONE SEEN NONE SEEN /LPF  MICROSCOPIC MESSAGE     Status: None   Collection Time: 08/07/20  4:39 PM  Result Value Ref Range   Note      Comment: This urine was analyzed for the presence of WBC,  RBC, bacteria, casts, and other formed elements.  Only those elements seen were reported. . .    Objective   Body mass index is 38.65 kg/m. Wt Readings from Last 3 Encounters:  08/07/20 246 lb 12.8 oz (111.9 kg)  06/26/20 246 lb (111.6 kg)  06/03/20 245 lb 6.4 oz (111.3 kg)   Temp Readings from Last 3 Encounters:  08/07/20 97.9 F (36.6 C) (Oral)  06/26/20 (!) 97.3 F (36.3 C) (Oral)  06/03/20 97.6 F (36.4 C)   BP Readings from Last 3 Encounters:  08/07/20 (!) 150/120  06/26/20 124/80  06/03/20 110/82   Pulse Readings from Last 3 Encounters:  08/07/20 94  06/26/20 86  06/03/20 95    Physical Exam Vitals and nursing note reviewed.  Constitutional:      Appearance: Normal appearance. She is well-developed and well-groomed. She is obese.  HENT:     Head: Normocephalic and atraumatic.  Cardiovascular:     Rate and Rhythm: Normal rate and regular rhythm.     Heart sounds: Normal heart sounds.  Skin:    General: Skin is warm and moist.  Neurological:     General: No focal deficit present.     Mental Status: She is alert and oriented to person, place, and time.     Gait: Gait normal.     Comments: BL walks with walker  Psychiatric:        Attention and Perception: Attention and perception normal.        Mood and Affect: Mood and affect normal.        Speech: Speech normal.        Behavior: Behavior normal. Behavior is cooperative.        Thought Content: Thought content normal.        Cognition and Memory: Cognition and memory normal.        Judgment: Judgment normal.    Assessment  Plan  AKI (acute kidney injury)improved likely due to lasix 40 and spironlactone 12.5 mg qd- Plan: Microalbumin / creatinine urine ratio, Sodium, urine, random, Urinalysis, Routine w reflex microscopic Reduce lasix to 20 mg qd stop spironolactone 12.5 mg qd   Abdominal aortic aneurysm (AAA) without rupture ([place]) on CT 03/19/19 F/u per guidelines with [place] New mild aneurysmal dilatation of the distal abdominal aorta. Recommend followup by ultrasound in 3 years. This recommendation follows ACR  consensus guidelines: White Paper of the ACR Incidental Findings Committee II on Vascular Findings. J Am Coll Radiol 2013; 10:789-794. Aortic Atherosclerosis (ICD10-I70.0).  Hypertension elevated today  Reduce lasix to 20 mg qd stop spironolactone 12.5 mg qd  Take norvasc 5 mg bid  with toprol 100 xl mg qd  Escalate therapy if needed in future consider [facility] low dose  Recurrent UTI  Consider f/u with urogyn Dr. Wannetta Sender in [place] if continues   HM Flu shot utd prevnar utd pna 23 due had 12/03/13 Tdap per prior PCP had 12/19/16 consider shingrix  in future  covid vx 3/3 consider 4th covid dose    Out of age window pap s/p hysterectomy ? What is left in I.e ovaries possibly b/l ovaries out    Mammogram 04/28/20 negative ordered [place]   Colonoscopy 10/08/16 negative Dr. Carlean Purl    dexa 03/05/15 normal   Former smoker age 41-40 max 1ppd    CT ab pelvis 03/19/19 IMPRESSION: 1. No acute findings or explanation for the patient's symptoms. No evidence of malignancy. 2. Enlarging bilateral renal cysts. 3. 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 Recommend followup by ultrasound in 3 years. This recommendation follows ACR consensus guidelines: White Paper of the ACR Incidental Findings Committee II on Vascular Findings. J Am Coll Radiol 2013; 10:789-794. Aortic Atherosclerosis (ICD10-I70.0). 4. Additional incidental findings including lower lumbar spondylosis, small umbilical hernia containing only fat and probable pelvic floor laxity.  Adrenals/Urinary Tract: Stable mild prominence of both adrenal glands without focal mass lesion. Enlarging bilateral renal cysts. No evidence of enhancing renal mass, urinary tract calculus or hydronephrosis. No focal bladder abnormality.     Prior PCPs Dr. Lacinda Axon and Dr. Delfina Redwood in [facility] Dr. Tamala Julian est ob/gyn     Provider: Dr. Olivia Mackie McLean-Scocuzza-Internal Medicine

## 2020-08-07 NOTE — Addendum Note (Signed)
Addended by: Leeanne Rio on: 08/07/2020 08:52 AM   Modules accepted: Orders

## 2020-08-08 LAB — BASIC METABOLIC PANEL WITHOUT GFR
BUN/Creatinine Ratio: 17 (calc) (ref 6–22)
BUN: 19 mg/dL (ref 7–25)
CO2: 21 mmol/L (ref 20–32)
Calcium: 9.8 mg/dL (ref 8.6–10.4)
Chloride: 108 mmol/L (ref 98–110)
Creat: 1.14 mg/dL — ABNORMAL HIGH (ref 0.60–0.88)
GFR, Est African American: 52 mL/min/{1.73_m2} — ABNORMAL LOW
GFR, Est Non African American: 45 mL/min/{1.73_m2} — ABNORMAL LOW
Glucose, Bld: 110 mg/dL — ABNORMAL HIGH (ref 65–99)
Potassium: 4.3 mmol/L (ref 3.5–5.3)
Sodium: 145 mmol/L (ref 135–146)

## 2020-08-08 LAB — TEST AUTHORIZATION

## 2020-08-08 LAB — URINALYSIS, ROUTINE W REFLEX MICROSCOPIC
Bacteria, UA: NONE SEEN /HPF
Bilirubin Urine: NEGATIVE
Glucose, UA: NEGATIVE
Hgb urine dipstick: NEGATIVE
Hyaline Cast: NONE SEEN /LPF
Ketones, ur: NEGATIVE
Leukocytes,Ua: NEGATIVE
Nitrite: NEGATIVE
RBC / HPF: NONE SEEN /HPF (ref 0–2)
Specific Gravity, Urine: 1.024 (ref 1.001–1.035)
WBC, UA: NONE SEEN /HPF (ref 0–5)
pH: 5.5 (ref 5.0–8.0)

## 2020-08-08 LAB — URINE CULTURE
MICRO NUMBER:: 12021085
SPECIMEN QUALITY:: ADEQUATE

## 2020-08-08 LAB — CORTISOL-AM, BLOOD: Cortisol - AM: 16.5 ug/dL

## 2020-08-08 LAB — MICROALBUMIN / CREATININE URINE RATIO
Creatinine, Urine: 205 mg/dL (ref 20–275)
Microalb Creat Ratio: 17 mcg/mg creat (ref <30)
Microalb, Ur: 3.4 mg/dL

## 2020-08-08 LAB — MICROSCOPIC MESSAGE

## 2020-08-08 LAB — SODIUM, URINE, RANDOM: Sodium, Ur: 66 mmol/L (ref 28–272)

## 2020-08-10 ENCOUNTER — Other Ambulatory Visit: Payer: Self-pay | Admitting: Internal Medicine

## 2020-08-10 DIAGNOSIS — R609 Edema, unspecified: Secondary | ICD-10-CM

## 2020-08-10 DIAGNOSIS — I10 Essential (primary) hypertension: Secondary | ICD-10-CM

## 2020-08-10 MED ORDER — FUROSEMIDE 20 MG PO TABS: 20.0000 mg | ORAL_TABLET | Freq: Every day | ORAL | 3 refills | Status: DC | PRN

## 2020-10-01 ENCOUNTER — Encounter: Payer: Self-pay | Admitting: Internal Medicine

## 2020-10-01 ENCOUNTER — Ambulatory Visit (INDEPENDENT_AMBULATORY_CARE_PROVIDER_SITE_OTHER): Payer: Medicare HMO | Admitting: Internal Medicine

## 2020-10-01 ENCOUNTER — Other Ambulatory Visit: Payer: Self-pay

## 2020-10-01 VITALS — BP 130/86 | HR 82 | Temp 97.1°F | Ht 67.0 in | Wt 246.8 lb

## 2020-10-01 DIAGNOSIS — F419 Anxiety disorder, unspecified: Secondary | ICD-10-CM | POA: Diagnosis not present

## 2020-10-01 DIAGNOSIS — I4819 Other persistent atrial fibrillation: Secondary | ICD-10-CM | POA: Diagnosis not present

## 2020-10-01 DIAGNOSIS — M545 Low back pain, unspecified: Secondary | ICD-10-CM | POA: Diagnosis not present

## 2020-10-01 DIAGNOSIS — M5416 Radiculopathy, lumbar region: Secondary | ICD-10-CM | POA: Diagnosis not present

## 2020-10-01 DIAGNOSIS — L304 Erythema intertrigo: Secondary | ICD-10-CM | POA: Diagnosis not present

## 2020-10-01 DIAGNOSIS — R0600 Dyspnea, unspecified: Secondary | ICD-10-CM

## 2020-10-01 DIAGNOSIS — G8929 Other chronic pain: Secondary | ICD-10-CM | POA: Diagnosis not present

## 2020-10-01 DIAGNOSIS — I38 Endocarditis, valve unspecified: Secondary | ICD-10-CM

## 2020-10-01 HISTORY — DX: Erythema intertrigo: L30.4

## 2020-10-01 MED ORDER — HYDROCODONE-ACETAMINOPHEN 5-325 MG PO TABS: 1.0000 | ORAL_TABLET | Freq: Two times a day (BID) | ORAL | 0 refills | Status: DC | PRN

## 2020-10-01 MED ORDER — GABAPENTIN 300 MG PO CAPS: 600.0000 mg | ORAL_CAPSULE | Freq: Every day | ORAL | 5 refills | Status: DC

## 2020-10-01 MED ORDER — ALPRAZOLAM 0.25 MG PO TABS: 0.2500 mg | ORAL_TABLET | Freq: Two times a day (BID) | ORAL | 5 refills | Status: DC | PRN

## 2020-10-01 MED ORDER — NYSTATIN 100000 UNIT/GM EX CREA: 1.0000 "application " | TOPICAL_CREAM | Freq: Three times a day (TID) | CUTANEOUS | 11 refills | Status: DC

## 2020-10-01 MED ORDER — HYDROCORTISONE 2.5 % EX CREA: TOPICAL_CREAM | Freq: Two times a day (BID) | CUTANEOUS | 2 refills | Status: AC

## 2020-10-01 NOTE — Progress Notes (Signed)
Chief Complaint  Patient presents with   Follow-up   F/u  1. Sob with walking she is not sleep well as sister is living with her and has to stay up at night making sure older sister ok. Will work up with cxr declines cxr today does not want to take bra off will resch cxr  Disc echo last echo in 2020 and CT calcium score pt wants to hold for now Disc sob could be anxiety, cardiac will r/o pulmonary with Cxr  2. Anxiety increased with sister (older) living with her and at times sister needs more help and has memory lapses    Review of Systems  Constitutional:  Negative for weight loss.  HENT:  Negative for hearing loss.   Eyes:  Negative for blurred vision.  Respiratory:  Positive for shortness of breath.   Cardiovascular:  Negative for chest pain.  Gastrointestinal:  Negative for heartburn.  Musculoskeletal:  Negative for falls.  Skin:  Positive for rash.  Neurological:  Negative for headaches.  Past Medical History:  Diagnosis Date   Allergic rhinitis    Anxiety    Benign neoplasm of breast 2013   Cardiac arrhythmia    Cardiomegaly    Cataract    CHF (congestive heart failure) (HCC)    Compulsive eating patterns    Compulsive overeating    Depression    Diastolic heart failure (HCC)    LVH with free wall thickness 1.4 cm EF 60% Dr. Tamala Julian    Diffuse cystic mastopathy 2013   Edema    Epistaxis    noted 12/20/18    GI bleeding    in ~2018    Gout    Hiatal hernia    HTN (hypertension)    Hyperlipidemia    Lump or mass in breast 2012   OA (osteoarthritis) of knee    with injections   Obesity    OSA (obstructive sleep apnea)    not on cpap   Other abnormal glucose    Peptic stricture of esophagus    Personal history of tobacco use, presenting hazards to health    Sciatica    Sinus infection 2010   Spinal stenosis    lumbar    Stroke ([place])    Unspecified sleep apnea    UTI (urinary tract infection)    e coli 06/2020   Past Surgical History:  Procedure  Laterality Date   adenosine cardiolite  1/08   low risk    APPENDECTOMY     BREAST BIOPSY     02/28/11 negative   BREAST LUMPECTOMY     right x1 ('89) left x2 ('70s, '90)   CARDIAC CATHETERIZATION  2001   normal per pt   COLONOSCOPY  11/02   diverticulosis   COLONOSCOPY  9/06   diverticulosis, hemorhoids   COLONOSCOPY N/A 10/01/2016   Procedure: COLONOSCOPY;  Surgeon: Gatha Mayer, MD;  Location: [facility];  Service: Endoscopy;  Laterality: N/A;   dexa  3/02   normal   EYE SURGERY  2012   cataract   IR CT HEAD LTD  09/01/2018   IR PERCUTANEOUS ART THROMBECTOMY/INFUSION INTRACRANIAL INC DIAG ANGIO  09/01/2018   JOINT REPLACEMENT     knee b/l 2011   knee replacement Right 9/11   R. Dr. Telford Nab   RADIOLOGY WITH ANESTHESIA N/A 09/01/2018   Procedure: IR WITH ANESTHESIA;  Surgeon: Luanne Bras, MD;  Location: [facility];  Service: Radiology;  Laterality: N/A;   sleep study  5/05  TONSILLECTOMY     TOTAL ABDOMINAL HYSTERECTOMY     fibroids, age of 33   TOTAL KNEE ARTHROPLASTY Left 2012   US TRANSVAGINAL PELVIC MODIFIED  2000, 2001   Family History  Problem Relation Age of Onset   Stroke Father    Hypertension Mother        (a lot of animosity in their relationship)   Heart failure Mother    Gout Other        whole family    Prostate cancer Brother    Cancer Brother        prostate    Hepatitis Brother        Hep C; alcoholism (terminal)   Drug abuse Brother        died 46   Breast cancer Sister    Cancer Sister        breast cancer   Other Brother        drug addiction   Social History   Socioeconomic History   Marital status: Widowed    Spouse name: Not on file   Number of children: 1   Years of education: Not on file   Highest education level: Not on file  Occupational History   Occupation: Retired school principal    Employer: RETIRED  Tobacco Use   Smoking status: Former    Packs/day: 0.50    Years: 20.00    Pack years: 10.00    Types:  Cigarettes   Smokeless tobacco: Never   Tobacco comments:    quit approx. 20 years ago  Vaping Use   Vaping Use: Never used  Substance and Sexual Activity   Alcohol use: Yes    Alcohol/week: 0.0 standard drinks    Comment: wine occasional   Drug use: No   Sexual activity: Never  Other Topics Concern   Not on file  Social History Narrative   Widowed x 30+ years as of 12/2018    1 daughter. Retired principal. 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    Social Determinants of Radio broadcast assistant Strain: Low Risk    Difficulty of Paying Living Expenses: Not hard at all  Food Insecurity: No Food Insecurity   Worried About Charity fundraiser in the Last Year: Never true   Arboriculturist in the Last Year: Never true  Transportation Needs: No Transportation Needs   Lack of Transportation (Medical): No   Lack of Transportation (Non-Medical): No  Physical Activity: Not on file  Stress: No Stress Concern Present   Feeling of Stress : Not at all  Social Connections: Unknown   Frequency of Communication with Friends and Family: More than three times a week   Frequency of Social Gatherings with Friends and Family: Not on file   Attends Religious Services: Not on file   Active Member of Clubs or Organizations: Not on file   Attends Archivist Meetings: Not on file   Marital Status: Not on file  Intimate Partner Violence: Not At Risk   Fear of Current or Ex-Partner: No   Emotionally Abused: No   Physically Abused: No   Sexually Abused: No   Current Meds  Medication Sig   allopurinol (ZYLOPRIM) 100 MG tablet TAKE 2 TABLETS BY MOUTH ONCE A DAY   amLODipine (NORVASC) 5 MG tablet Take 1 tablet (5 mg total) by mouth daily. Take additional 5 mg in pm if BP>130/>80  apixaban (ELIQUIS) 5 MG TABS tablet Take 1 tablet (5 mg total) by mouth 2 (two) times daily.   Ascorbic Acid (VITAMIN C PO) Take by mouth.   atorvastatin (LIPITOR)  20 MG tablet TAKE 1 TABLET (20 MG TOTAL) BY MOUTH DAILY. AT NIGHT. STOP/DISCONTINUE LOVASTATIN 20   Cholecalciferol (VITAMIN D3 PO) Take by mouth.   diclofenac Sodium (VOLTAREN) 1 % GEL Apply 2 g topically 4 (four) times daily as needed.   digoxin (LANOXIN) 0.125 MG tablet Take 0.5 tablets (0.0625 mg total) by mouth daily.   DULoxetine (CYMBALTA) 30 MG capsule Take 1 capsule (30 mg total) by mouth daily.   furosemide (LASIX) 20 MG tablet Take 1 tablet (20 mg total) by mouth daily as needed. Stop spironolactone due to AKI reduce lasix 20 mg   hydrocortisone 2.5 % cream Apply topically 2 (two) times daily.   Magnesium 250 MG TABS Take 250 mg by mouth daily.   metoprolol succinate (TOPROL-XL) 100 MG 24 hr tablet TAKE 1 TABLET BY MOUTH DAILY WITH OR IMMEDIATLY FOLLOWING A MEAL   nystatin cream (MYCOSTATIN) Apply 1 application topically 3 (three) times daily. Prn right back   potassium chloride (KLOR-CON) 10 MEQ tablet Take 1 tablet (10 mEq total) by mouth daily.   [DISCONTINUED] ALPRAZolam (XANAX) 0.25 MG tablet Take 1 tablet (0.25 mg total) by mouth 2 (two) times daily as needed for anxiety.   [DISCONTINUED] gabapentin (NEURONTIN) 300 MG capsule Take 2 capsules (600 mg total) by mouth at bedtime.   [DISCONTINUED] HYDROcodone-acetaminophen (NORCO/VICODIN) 5-325 MG tablet Take 1 tablet by mouth 2 (two) times daily as needed for moderate pain.   Allergies  Allergen Reactions   Buspirone Hcl Other (See Comments)    REACTION: made her sleepy, ? swollen legs   Lisinopril Other (See Comments)    REACTION: dizziness   Prozac [Fluoxetine Hcl] Other (See Comments)    Sleepy as of 06/26/20 states did not make sleepy   Spironolactone     With lasix 40 mg and 12.5 spironolactone caused AKI   Recent Results (from the past 2160 hour(s))  Vitamin D (25 hydroxy)     Status: None   Collection Time: 07/14/20 11:16 AM  Result Value Ref Range   VITD 43.31 30.00 - 100.00 ng/mL  CBC with Differential/Platelet      Status: Abnormal   Collection Time: 07/14/20 11:16 AM  Result Value Ref Range   WBC 6.1 4.0 - 10.5 K/uL   RBC 4.04 3.87 - 5.11 Mil/uL   Hemoglobin 11.6 (L) 12.0 - 15.0 g/dL   HCT 35.5 (L) 36.0 - 46.0 %   MCV 87.9 78.0 - 100.0 fl   MCHC 32.6 30.0 - 36.0 g/dL   RDW 15.8 (H) 11.5 - 15.5 %   Platelets 216.0 150.0 - 400.0 K/uL   Neutrophils Relative % 64.2 43.0 - 77.0 %   Lymphocytes Relative 23.7 12.0 - 46.0 %   Monocytes Relative 8.5 3.0 - 12.0 %   Eosinophils Relative 3.1 0.0 - 5.0 %   Basophils Relative 0.5 0.0 - 3.0 %   Neutro Abs 3.9 1.4 - 7.7 K/uL   Lymphs Abs 1.4 0.7 - 4.0 K/uL   Monocytes Absolute 0.5 0.1 - 1.0 K/uL   Eosinophils Absolute 0.2 0.0 - 0.7 K/uL   Basophils Absolute 0.0 0.0 - 0.1 K/uL  Lipid panel     Status: Abnormal   Collection Time: 07/14/20 11:16 AM  Result Value Ref Range   Cholesterol 93 0 - 200 mg/dL  Comment: ATP III Classification       Desirable:  < 200 mg/dL               Borderline High:  200 - 239 mg/dL          High:  > = 240 mg/dL   Triglycerides 75.0 0.0 - 149.0 mg/dL    Comment: Normal:  <150 mg/dLBorderline High:  150 - 199 mg/dL   HDL 33.50 (L) >39.00 mg/dL   VLDL 15.0 0.0 - 40.0 mg/dL   LDL Cholesterol 45 0 - 99 mg/dL   Total CHOL/HDL Ratio 3     Comment:                Men          Women1/2 Average Risk     3.4          3.3Average Risk          5.0          4.42X Average Risk          9.6          7.13X Average Risk          15.0          11.0                       NonHDL 59.76     Comment: NOTE:  Non-HDL goal should be 30 mg/dL higher than patient's LDL goal (i.e. LDL goal of < 70 mg/dL, would have non-HDL goal of < 100 mg/dL)  Comprehensive metabolic panel     Status: Abnormal   Collection Time: 07/14/20 11:16 AM  Result Value Ref Range   Sodium 142 135 - 145 mEq/L   Potassium 4.2 3.5 - 5.1 mEq/L   Chloride 107 96 - 112 mEq/L   CO2 28 19 - 32 mEq/L   Glucose, Bld 99 70 - 99 mg/dL   BUN 31 (H) 6 - 23 mg/dL   Creatinine, Ser 1.30 (H)  0.40 - 1.20 mg/dL   Total Bilirubin 0.9 0.2 - 1.2 mg/dL   Alkaline Phosphatase 88 39 - 117 U/L   AST 14 0 - 37 U/L   ALT 12 0 - 35 U/L   Total Protein 6.4 6.0 - 8.3 g/dL   Albumin 4.0 3.5 - 5.2 g/dL   GFR 38.20 (L) >60.00 mL/min    Comment: Calculated using the CKD-EPI Creatinine Equation (2021)   Calcium 9.6 8.4 - 10.5 mg/dL  Urine Culture     Status: Abnormal   Collection Time: 07/17/20  2:06 PM   Specimen: Urine  Result Value Ref Range   MICRO NUMBER: [id number]    SPECIMEN QUALITY: Adequate    Sample Source NOT GIVEN    STATUS: FINAL    ISOLATE 1: Streptococcus agalactiae (A)     Comment: Greater than 100,000 CFU/mL of Group B Streptococcus isolated Beta-hemolytic streptococci are predictably susceptible to Penicillin and other beta-lactams. Susceptibility testing not routinely performed. Please contact the laboratory within 3 days if  susceptibility testing is desired. Erythromycin and clindamycin are not recommended for treatment of urinary tract infections, but clindamycin may be useful for treatment in penicillin allergic patients for rectovaginal colonization or for intrapartum  prophylaxis if indicated.   Cortisol-am, blood     Status: None   Collection Time: 08/05/20 10:31 AM  Result Value Ref Range   Cortisol - AM 16.5 mcg/dL    Comment: Reference Range  8 a.m. (7-9 a.m.) Specimen: 4.0-22.0 .   BASIC METABOLIC PANEL WITH GFR     Status: Abnormal   Collection Time: 08/05/20 10:31 AM  Result Value Ref Range   Glucose, Bld 110 (H) 65 - 99 mg/dL    Comment: .            Fasting reference interval . For someone without known diabetes, a glucose value between 100 and 125 mg/dL is consistent with prediabetes and should be confirmed with a follow-up test. .    BUN 19 7 - 25 mg/dL   Creat 1.14 (H) 0.60 - 0.88 mg/dL    Comment: For patients >69 years of age, the reference limit for Creatinine is approximately 13% higher for people identified as African-American. .     GFR, Est Non African American 45 (L) > OR = 60 mL/min/1.65m   GFR, Est African American 52 (L) > OR = 60 mL/min/1.794m  BUN/Creatinine Ratio 17 6 - 22 (calc)   Sodium 145 135 - 146 mmol/L   Potassium 4.3 3.5 - 5.3 mmol/L   Chloride 108 98 - 110 mmol/L   CO2 21 20 - 32 mmol/L   Calcium 9.8 8.6 - 10.4 mg/dL  TEST AUTHORIZATION     Status: None   Collection Time: 08/05/20 10:31 AM  Result Value Ref Range   TEST NAME: BASIC METABOLIC PANEL    TEST CODE: [id number]    CLIENT CONTACT: TOYA W    REPORT ALWAYS MESSAGE SIGNATURE      Comment: . The laboratory testing on this patient was verbally requested or confirmed by the ordering physician or his or her authorized representative after contact with an employee of [employer] regulations require that we maintain on file written authorization for all laboratory testing.  Accordingly we are asking that the ordering physician or his or her authorized representative sign a copy of this report and promptly return it to the client service representative. . . Signature:____________________________________________________ . Please fax this signed page to [phone] return it via your [employer].   Microalbumin / creatinine urine ratio     Status: None   Collection Time: 08/07/20  3:56 PM  Result Value Ref Range   Creatinine, Urine 205 20 - 275 mg/dL   Microalb, Ur 3.4 mg/dL    Comment: Reference Range Not established    Microalb Creat Ratio 17 <30 mcg/mg creat    Comment: . The ADA defines abnormalities in albumin excretion as follows: . Marland Kitchenlbuminuria Category        Result (mcg/mg creatinine) . Normal to Mildly increased   <30 Moderately increased         30-299  Severely increased           > OR = 300 . The ADA recommends that at least two of three specimens collected within a 3-6 month period be abnormal before considering a patient to be within a diagnostic category.   Sodium, urine,  random     Status: None   Collection Time: 08/07/20  3:56 PM  Result Value Ref Range   Sodium, Ur 66 28 - 272 mmol/L  Urine Culture     Status: None   Collection Time: 08/07/20  3:56 PM   Specimen: Urine  Result Value Ref Range   MICRO NUMBER: [id number]  SPECIMEN QUALITY: Adequate    Sample Source NOT GIVEN    STATUS: FINAL    Result:      Less  than 10,000 CFU/mL of single Gram negative organism isolated. No further testing will be performed. If clinically indicated, recollection using a method to minimize contamination, with prompt transfer to Urine Culture Transport Tube, is recommended.  Urinalysis, Routine w reflex microscopic     Status: Abnormal   Collection Time: 08/07/20  4:39 PM  Result Value Ref Range   Color, Urine YELLOW YELLOW   APPearance CLEAR CLEAR   Specific Gravity, Urine 1.024 1.001 - 1.035   pH 5.5 5.0 - 8.0   Glucose, UA NEGATIVE NEGATIVE   Bilirubin Urine NEGATIVE NEGATIVE   Ketones, ur NEGATIVE NEGATIVE   Hgb urine dipstick NEGATIVE NEGATIVE   Protein, ur TRACE (A) NEGATIVE   Nitrite NEGATIVE NEGATIVE   Leukocytes,Ua NEGATIVE NEGATIVE   WBC, UA NONE SEEN 0 - 5 /HPF   RBC / HPF NONE SEEN 0 - 2 /HPF   Squamous Epithelial / LPF 6-10 (A) < OR = 5 /HPF   Bacteria, UA NONE SEEN NONE SEEN /HPF   Hyaline Cast NONE SEEN NONE SEEN /LPF  MICROSCOPIC MESSAGE     Status: None   Collection Time: 08/07/20  4:39 PM  Result Value Ref Range   Note      Comment: This urine was analyzed for the presence of WBC,  RBC, bacteria, casts, and other formed elements.  Only those elements seen were reported. . .    Objective  Body mass index is 38.65 kg/m. Wt Readings from Last 3 Encounters:  10/01/20 246 lb 12.8 oz (111.9 kg)  08/07/20 246 lb 12.8 oz (111.9 kg)  06/26/20 246 lb (111.6 kg)   Temp Readings from Last 3 Encounters:  10/01/20 (!) 97.1 F (36.2 C) (Temporal)  08/07/20 97.9 F (36.6 C) (Oral)  06/26/20 (!) 97.3 F (36.3 C) (Oral)   BP Readings from  Last 3 Encounters:  10/01/20 130/86  08/07/20 (!) 150/120  06/26/20 124/80   Pulse Readings from Last 3 Encounters:  10/01/20 82  08/07/20 94  06/26/20 86    Physical Exam Vitals and nursing note reviewed.  Constitutional:      Appearance: Normal appearance. She is well-developed and well-groomed. She is obese.  HENT:     Head: Normocephalic and atraumatic.  Eyes:     Conjunctiva/sclera: Conjunctivae normal.     Pupils: Pupils are equal, round, and reactive to light.  Cardiovascular:     Rate and Rhythm: Normal rate and regular rhythm.     Heart sounds: Normal heart sounds. No murmur heard. Musculoskeletal:     Right lower leg: 1+ Edema present.     Left lower leg: 1+ Edema present.  Skin:    General: Skin is warm and dry.  Neurological:     General: No focal deficit present.     Mental Status: She is alert and oriented to person, place, and time. Mental status is at baseline.     Comments: BL walks with cane  Psychiatric:        Attention and Perception: Attention and perception normal.        Mood and Affect: Mood and affect normal.        Speech: Speech normal.        Behavior: Behavior normal. Behavior is cooperative.        Thought Content: Thought content normal.        Cognition and Memory: Cognition and memory normal.        Judgment: Judgment normal.    Assessment  Plan  Dyspnea,  r/o cardiac, pulm vs other physical deconditioning/anxieyt - Plan: ECHOCARDIOGRAM COMPLETE, DG Chest 2 View Valvular heart disease - Plan: ECHOCARDIOGRAM COMPLETE Persistent atrial fibrillation (HCC) - Plan: ECHOCARDIOGRAM COMPLETE Consider ct calcium score   Intertrigo - Plan: nystatin cream (MYCOSTATIN), hydrocortisone 2.5 % cream Gold bond talc free powder   Lumbar radiculopathy - Plan: gabapentin (NEURONTIN) 300 MG capsule  Anxiety - Plan: ALPRAZolam (XANAX) 0.25 MG tablet  Chronic low back pain, unspecified back pain laterality, unspecified whether sciatica present -  Plan: HYDROcodone-acetaminophen (NORCO/VICODIN) 5-325 MG tablet   HM Flu shot utd prevnar utd pna 23 due had 12/03/13 Tdap per prior PCP had 12/19/16 consider shingrix  in future  covid vx 3/3 consider 4th covid dose    Out of age window pap s/p hysterectomy ? What is left in I.e ovaries possibly b/l ovaries out    Mammogram 04/28/20 negative ordered [place]   Colonoscopy 10/08/16 negative Dr. Carlean Purl    dexa 03/05/15 normal   Former smoker age 61-40 max 1ppd    CT ab pelvis 03/19/19 IMPRESSION: 1. No acute findings or explanation for the patient's symptoms. No evidence of malignancy. 2. Enlarging bilateral renal cysts. 3. 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 Recommend followup by ultrasound in 3 years. This recommendation follows ACR consensus guidelines: White Paper of the ACR Incidental Findings Committee II on Vascular Findings. J Am Coll Radiol 2013; 10:789-794. Aortic Atherosclerosis (ICD10-I70.0). 4. Additional incidental findings including lower lumbar spondylosis, small umbilical hernia containing only fat and probable pelvic floor laxity.  Adrenals/Urinary Tract: Stable mild prominence of both adrenal glands without focal mass lesion. Enlarging bilateral renal cysts. No evidence of enhancing renal mass, urinary tract calculus or hydronephrosis. No focal bladder abnormality.     Prior PCPs Dr. Lacinda Axon and Dr. Delfina Redwood in [facility] Dr. Tamala Julian est ob/gyn     Provider: Dr. Olivia Mackie McLean-Scocuzza-Internal Medicine

## 2020-10-01 NOTE — Patient Instructions (Addendum)
Touched by an [facility] help for home care  Consider echo of your heart and CT calcium score to work up your shortness of breath it may be stress/anxiety physical deconditioning   In skin folds gold bond talc free powder to keep dry to prevent excess sweating   When red and irritated use cream nystatin with hydrocortisone     Shortness of Breath, Adult Shortness of breath is when a person has trouble breathing enough air or when a person feels like she or he is having trouble breathing in enough air.Shortness of breath could be a sign of a medical problem. Follow these instructions at home:  Pay attention to any changes in your symptoms. Do not use any products that contain nicotine or tobacco, such as cigarettes, e-cigarettes, and chewing tobacco. Do not smoke. Smoking is a common cause of shortness of breath. If you need help quitting, ask your health care provider. Avoid things that can irritate your airways, such as: Mold. Dust. Air pollution. Chemical fumes. Things that can cause allergy symptoms (allergens), if you have allergies. Keep your living space clean and free of mold and dust. Rest as needed. Slowly return to your usual activities. Take over-the-counter and prescription medicines only as told by your health care provider. This includes oxygen therapy and inhaled medicines. Keep all follow-up visits as told by your health care provider. This is important. Contact a health care provider if: Your condition does not improve as soon as expected. You have a hard time doing your normal activities, even after you rest. You have new symptoms. Get help right away if: Your shortness of breath gets worse. You have shortness of breath when you are resting. You feel light-headed or you faint. You have a cough that is not controlled with medicines. You cough up blood. You have pain with breathing. You have pain in your chest, arms, shoulders, or abdomen. You have a fever. You  cannot walk up stairs or exercise the way that you normally do. These symptoms may represent a serious problem that is an emergency. Do not wait to see if the symptoms will go away. Get medical help right away. Call your local emergency services (911 in the [place] Do not drive yourself to the hospital. Summary Shortness of breath is when a person has trouble breathing enough air. It can be a sign of a medical problem. Avoid things that irritate your lungs, such as smoking, pollution, mold, and dust. Pay attention to changes in your symptoms and contact your health care provider if you have a hard time completing daily activities because of shortness of breath. This information is not intended to replace advice given to you by your health care provider. Make sure you discuss any questions you have with your healthcare provider. Document Revised: 07/10/2017 Document Reviewed: 07/10/2017 Elsevier Patient Education  2022 [employer].

## 2020-10-09 ENCOUNTER — Encounter: Payer: Self-pay | Admitting: Internal Medicine

## 2020-10-11 ENCOUNTER — Other Ambulatory Visit: Payer: Self-pay | Admitting: Interventional Cardiology

## 2020-10-12 NOTE — Telephone Encounter (Signed)
Eliquis '5mg'$  refill request received. Patient is 85 years old, weight-111.9kg, Crea-1.14 on 08/05/2020, Diagnosis-Afib/flutter, and last seen by Dr. Tamala Julian on 05/28/20. Dose is appropriate based on dosing criteria. Will send in refill to requested pharmacy.

## 2020-10-18 ENCOUNTER — Other Ambulatory Visit: Payer: Self-pay | Admitting: Internal Medicine

## 2020-10-20 NOTE — Telephone Encounter (Signed)
Last note states DC due to AKI okay to decline medication refill on spironolactone.

## 2020-10-20 NOTE — Telephone Encounter (Signed)
Ok to decline spironolactone if she was told to stop and is not taking.

## 2020-11-10 ENCOUNTER — Encounter: Payer: Self-pay | Admitting: Adult Health

## 2020-11-10 ENCOUNTER — Ambulatory Visit: Payer: Medicare HMO | Admitting: Adult Health

## 2020-11-10 VITALS — BP 154/90 | HR 78 | Ht 67.0 in | Wt 241.0 lb

## 2020-11-10 DIAGNOSIS — I63412 Cerebral infarction due to embolism of left middle cerebral artery: Secondary | ICD-10-CM | POA: Diagnosis not present

## 2020-11-10 DIAGNOSIS — G8929 Other chronic pain: Secondary | ICD-10-CM

## 2020-11-10 DIAGNOSIS — M542 Cervicalgia: Secondary | ICD-10-CM | POA: Diagnosis not present

## 2020-11-10 DIAGNOSIS — I4892 Unspecified atrial flutter: Secondary | ICD-10-CM

## 2020-11-10 DIAGNOSIS — M5416 Radiculopathy, lumbar region: Secondary | ICD-10-CM | POA: Diagnosis not present

## 2020-11-10 NOTE — Patient Instructions (Addendum)
Continue Eliquis (apixaban) daily  and atorvastatin 72m daily  for secondary stroke prevention  Continue to follow up with PCP regarding cholesterol and blood pressure management  Maintain strict control of hypertension with blood pressure goal below 130/90 and cholesterol with LDL cholesterol (bad cholesterol) goal below 70 mg/dL.   Would recommend trialing relaxation massage, moist heat and consider doing aquatic therapy  Very important to try to manage stress levels as this is likely contributing to your memory loss and continued pains      Followup in the future with me in 6 months or call earlier if needed       [facility]. I hope we have been able to provide you high quality care today.  You may receive a patient satisfaction survey over the next few weeks. We would appreciate your feedback and comments so that we may continue to improve ourselves and the health of our patients.    Managing Stress, Adult Feeling a certain amount of stress is normal. Stress helps our body and mind get ready to deal with the demands of life. Stress hormones can motivate you to do well at work and meet your responsibilities. However severe or long-lasting (chronic) stress can affect your mental and physical health. Chronic stress puts you at higher risk for anxiety, depression, and other health problems like digestive problems, muscle aches, heart disease, high blood pressure, and stroke. What are the causes? Common causes of stress include: Demands from work, such as deadlines, feeling overworked, or having long hours. Pressures at home, such as money issues, disagreements with a spouse, or parenting issues. Pressures from major life changes, such as divorce, moving, loss of a loved one, or chronic illness. You may be at higher risk for stress-related problems if you do not get enough sleep, are in poor health, do not have emotional support,  or have a mental health disorder like anxiety or depression. How to recognize stress Stress can make you: Have trouble sleeping. Feel sad, anxious, irritable, or overwhelmed. Lose your appetite. Overeat or want to eat unhealthy foods. Want to use drugs or alcohol. Stress can also cause physical symptoms, such as: Sore, tense muscles, especially in the shoulders and neck. Headaches. Trouble breathing. A faster heart rate. Stomach pain, nausea, or vomiting. Diarrhea or constipation. Trouble concentrating. Follow these instructions at home: Lifestyle Identify the source of your stress and your reaction to it. See a therapist who can help you change your reactions. When there are stressful events: Talk about it with family, friends, or co-workers. Try to think realistically about stressful events and not ignore them or overreact. Try to find the positives in a stressful situation and not focus on the negatives. Cut back on responsibilities at work and home, if possible. Ask for help from friends or family members if you need it. Find ways to cope with stress, such as: Meditation. Deep breathing. Yoga or tai chi. Progressive muscle relaxation. Doing art, playing music, or reading. Making time for fun activities. Spending time with family and friends. Get support from family, friends, or spiritual resources. Eating and drinking Eat a healthy diet. This includes: Eating foods that are high in fiber, such as beans, whole grains, and fresh fruits and vegetables. Limiting foods that are high in fat and processed sugars, such as fried and sweet foods. Do not skip meals or overeat. Drink enough fluid to keep your urine pale yellow. Alcohol use Do not drink  alcohol if: Your health care provider tells you not to drink. You are pregnant, may be pregnant, or are planning to become pregnant. Drinking alcohol is a way some people try to ease their stress. This can be dangerous, so if you  drink alcohol: Limit how much you use to: 0-1 drink a day for women. 0-2 drinks a day for men. Be aware of how much alcohol is in your drink. In the [place] one drink equals one 12 oz bottle of beer (355 mL), one 5 oz glass of wine (148 mL), or one 1 oz glass of hard liquor (44 mL). Activity  Include 30 minutes of exercise in your daily schedule. Exercise is a good stress reducer. Include time in your day for an activity that you find relaxing. Try taking a walk, going on a bike ride, reading a book, or listening to music. Schedule your time in a way that lowers stress, and keep a consistent schedule. Prioritize what is most important to get done. General instructions Get enough sleep. Try to go to sleep and get up at about the same time every day. Take over-the-counter and prescription medicines only as told by your health care provider. Do not use any products that contain nicotine or tobacco, such as cigarettes, e-cigarettes, and chewing tobacco. If you need help quitting, ask your health care provider. Do not use drugs or smoke to cope with stress. Keep all follow-up visits as told by your health care provider. This is important. Where to find support Talk with your health care provider about stress management or finding a support group. Find a therapist to work with you on your stress management techniques. Contact a health care provider if: Your stress symptoms get worse. You are unable to manage your stress at home. You are struggling to stop using drugs or alcohol. Get help right away if: You may be a danger to yourself or others. You have any thoughts of death or suicide. If you ever feel like you may hurt yourself or others, or have thoughts about taking your own life, get help right away. You can go to your nearest emergency department or call: Your local emergency services (911 in the [place] A suicide crisis helpline, such as the [facility] at  763-326-2735. This is open 24 hours a day. Summary Feeling a certain amount of stress is normal, but severe or long-lasting (chronic) stress can affect your mental and physical health. Chronic stress can put you at higher risk for anxiety, depression, and other health problems like digestive problems, muscle aches, heart disease, high blood pressure, and stroke. You may be at higher risk for stress-related problems if you do not get enough sleep, are in poor health, lack emotional support, or have a mental health disorder like anxiety or depression. Identify the source of your stress and your reaction to it. Try talking about stressful events with family, friends, or co-workers, finding a coping method, or getting support from spiritual resources. If you need more help, talk with your health care provider about finding a support group or a mental health therapist. This information is not intended to replace advice given to you by your health care provider. Make sure you discuss any questions you have with your health care provider. Document Revised: 04/17/2020 Document Reviewed: 09/05/2018 Elsevier Patient Education  [employer].

## 2020-11-10 NOTE — Progress Notes (Signed)
[facility] [address] [place]. [place] [address] [phone]       STROKE FOLLOW UP NOTE  [name] Date of Birth:  April 25, 1935 Medical Record Number:  [id number]   Reason for Referral: Left MCA stroke 08/2018    CHIEF COMPLAINT:  Chief Complaint  Patient presents with   Follow-up    RM 3 alone  Pt is well, states she is still having memory trouble and imbalance. Having some pain in arm but may be due to arthritis      HPI:   Today, 11/10/2020, [name] returns for 1-month follow-up regarding left MCA stroke in 08/2018.  Overall doing well.  No neurological concerns today.  Denies new stroke/TIA symptoms.  Compliant on Eliquis and atorvastatin without side effects.  Blood pressure today 154/90.  Routinely followed by cardiology with 3 day Zio patch monitor (05/2020) showed 100% A. fib burden - as remains asymptomatic, cards recommends continued monitoring and no intervention required at this time.  Continues to follow with PCP routinely currently managing chronic low back pain with lumbar radiculopathy. She does complain of right arm pain which is not new. Also c/o short-term memory loss at times as well as worsening depression.  She does admit to being under increased stress over the past 2 to 3 months as her sister moved in with her and now responsible for caring for her.  No further concerns at this time.    History provided for reference purposes only Update 05/05/2020 JM: [name] returns for stroke follow-up  Stable from stroke standpoint without new or reoccurring stroke/TIA symptoms Compliant on Eliquis and atorvastatin -denies side effects Blood pressure today 127/75  Chronic cervical and lumbar pain currently stable - currently on gabapentin taking 600mg  nightly and duloxetin 30mg  daily managed by PCP  No further concerns at this time  Update 12/30/2019 JM: [name] returns for stroke follow up accompianed by her daughter.   She has been stable from a stroke standpoint without new or recurring stroke/TIA symptoms.  Remains on Eliquis and atorvastatin for secondary stroke prevention without side effects.  Blood pressure today 138/88 routinely monitors at home which has been stable. Multiple other none stroke complaints including intermittent bilateral hand numbness and heaviness sensation on the bottom of her feet bilaterally at night.  Reports symptoms present over the past few months.  Hand numbness can be in both right or left hand generalized without specific affected fingers or areas and can last for 5 to 10 minutes.  Symptoms usually present upon awakening but also at times while sitting in a chair or increased activity.  Denies any associated weakness.  Does report ongoing bilateral neck and shoulder pain with increased tightness of upper trapezius especially when looking to the right or the left.  Denies numbness in the bottom of her feet but more of a heaviness/tightness sensation and has been using lotion/balm for very dry, flaky skin.  She continues to experience lower back pain which worsens after standing for too long.  This was previously discussed and had evaluation by neurosurgery who recommended surgical procedure but she was not interested in surgical intervention.  This pain previously managed well on gabapentin 300 mg 3 times daily but currently on 300 mg twice daily.  No further concerns at this time.  Update 06/26/2019 JM: [name] returns for stroke follow-up accompianed by her daughter.  She has been stable since prior visit without residual deficits and denies new or recurrent stroke/TIA symptoms.  Continues on Eliquis with history of atrial fibrillation and secondary stroke prevention without side effects. Blood pressure today 152/82.  No neurological concerns at this time.  Update 12/26/2018: [name] is an 85 year old female who is being seen today for stroke follow-up.  She has been stable from a  stroke standpoint without new or reoccurring stroke/TIA symptoms.  Continues on Eliquis without bleeding or bruising.  Ongoing follow-up with cardiology regularly.  At prior visit, patient's main concern was low back pain with radiculopathy limiting functional ability.  Initiated gabapentin at prior visit and recommended follow-up with established orthopedic provider.  Continue to experience pain despite gabapentin.  Due to ongoing pain, she requested referral to neurosurgery for further evaluation and potential treatment options.  She states her only option was back surgery which she is not interested at this time due to recent stroke and anticoagulation.  She continues to take gabapentin and does endorse improvement and benefit with ongoing use.  Denies side effects.  Due to decreased pain with gabapentin, she has slowly increased activity and daily functioning.  Continues to use a cane for ambulation and denies any recent falls.  Continues to live independently but daughter will check on her frequently along with other family members.  She endorses improvement of her overall mood.  Cardiology started her on Xanax 0.25 mg daily as needed for anxiety but only had to take 1 time and has not had any recurrent anxiety symptoms.  Recently initiated care with new PCP Dr. Terese Door.  She is questioning potential return to driving.  No further concerns at this time.  Initial visit 10/25/2018: [name] is being seen today for hospital follow-up and is accompanied by her daughter.  She has recovered well from a stroke standpoint without residual deficits.  She has since completed home health therapies.  She continues on Eliquis without bleeding or bruising for atrial flutter and secondary stroke prevention along with treatment of bilateral DVT.  She continues on amiodarone taper and plans on doing 30-day cardiac event monitor after 11/2 (amioderone will be completed at that time).  She was initially staying with  her daughter at hospital discharge but has since returned home.  She has been having difficulty with bilateral lower back pain with radiculopathy R>L leg pain.  Pain consists of stabbing sensation which is worsened with ambulation and progressively worsening.  She previously was being seen by Ortho with injections.  She had trialed gabapentin in the past which she tolerated well but would only use intermittently.  Prior MRI lumbar spine and 08/2017 showed multiple levels of spinal stenosis and degenerative changes.  Initially upon hospital discharge, she continued to have right-sided weakness therefore relying heavily on left side for ambulation.  She has not contacted her establish orthopedic provider, Dr. Tamala Julian, in regards to her worsening back pain.  She has also been experiencing increased anxiety and depression since returning home which she believes is more due to her constant pain which has been limiting mobility and functioning.  No further concerns at this time.  Denies new or worsening stroke/TIA symptoms.  Stroke admission 09/01/2018: [name] is a 85 y.o. female with history of hypertension, hyperlipidemia, obesity, remote GI bleeding who presented to [facility] ED on 09/01/2018 with sudden onset aphasia and right hemiparesis.  Neurology consulted with stroke work-up revealing left MCA punctate small infarct due to left M1 occlusion status post TPA and IR with TICI 3 reperfusion embolic pattern in setting of bilateral DVT and new onset A.  Fib.  She received TPA without complication.  MRI showed left MCA infarcts.  CTA head/neck showed occlusion of distal M1 with intermediate collateralization, bilateral ICA bulb arthrosclerosis and right VA origin stenosis.  2D echo normal EF.  Lower extremity venous Doppler showed right PTV, left popliteal and bilateral peroneal vein acute DVT.  Eliquis initiated for treatment of DVT and new onset atrial fibrillation.  No evidence of HLD with LDL 66 or DM with A1c  6.2.  HTN stable.  Due to atrial fibrillation/atrial flutter with RVR, amiodarone drip initiated with conversion to NSR and recommended continuation of amiodarone with tapering schedule for 6 to 12 weeks and follow-up with cardiology outpatient.  Other stroke risk factors include advanced age, former tobacco use, EtOH use, obesity, family history of stroke and OSA.  Discharged home in stable condition recommendation home health therapies.    ROS:   14 system review of systems performed and negative with exception of those listed in HPI  PMH:  Past Medical History:  Diagnosis Date   Allergic rhinitis    Anxiety    Benign neoplasm of breast 2013   Cardiac arrhythmia    Cardiomegaly    Cataract    CHF (congestive heart failure) (HCC)    Compulsive eating patterns    Compulsive overeating    Depression    Diastolic heart failure (HCC)    LVH with free wall thickness 1.4 cm EF 60% Dr. Tamala Julian    Diffuse cystic mastopathy 2013   Edema    Epistaxis    noted 12/20/18    GI bleeding    in ~2018    Gout    Hiatal hernia    HTN (hypertension)    Hyperlipidemia    Lump or mass in breast 2012   OA (osteoarthritis) of knee    with injections   Obesity    OSA (obstructive sleep apnea)    not on cpap   Other abnormal glucose    Peptic stricture of esophagus    Personal history of tobacco use, presenting hazards to health    Sciatica    Sinus infection 2010   Spinal stenosis    lumbar    Stroke ([place])    Unspecified sleep apnea    UTI (urinary tract infection)    e coli 06/2020    PSH:  Past Surgical History:  Procedure Laterality Date   adenosine cardiolite  1/08   low risk    APPENDECTOMY     BREAST BIOPSY     02/28/11 negative   BREAST LUMPECTOMY     right x1 ('89) left x2 ('70s, '90)   CARDIAC CATHETERIZATION  2001   normal per pt   COLONOSCOPY  11/02   diverticulosis   COLONOSCOPY  9/06   diverticulosis, hemorhoids   COLONOSCOPY N/A 10/01/2016   Procedure:  COLONOSCOPY;  Surgeon: Gatha Mayer, MD;  Location: [facility];  Service: Endoscopy;  Laterality: N/A;   dexa  3/02   normal   EYE SURGERY  2012   cataract   IR CT HEAD LTD  09/01/2018   IR PERCUTANEOUS ART THROMBECTOMY/INFUSION INTRACRANIAL INC DIAG ANGIO  09/01/2018   JOINT REPLACEMENT     knee b/l 2011   knee replacement Right 9/11   R. Dr. Telford Nab   RADIOLOGY WITH ANESTHESIA N/A 09/01/2018   Procedure: IR WITH ANESTHESIA;  Surgeon: Luanne Bras, MD;  Location: [facility];  Service: Radiology;  Laterality: N/A;   sleep study  5/05   TONSILLECTOMY  TOTAL ABDOMINAL HYSTERECTOMY     fibroids, age of 33   TOTAL KNEE ARTHROPLASTY Left 2012   US TRANSVAGINAL PELVIC MODIFIED  2000, 2001    Social History:  Social History   Socioeconomic History   Marital status: Widowed    Spouse name: Not on file   Number of children: 1   Years of education: Not on file   Highest education level: Not on file  Occupational History   Occupation: Retired school principal    Employer: RETIRED  Tobacco Use   Smoking status: Former    Packs/day: 0.50    Years: 20.00    Pack years: 10.00    Types: Cigarettes   Smokeless tobacco: Never   Tobacco comments:    quit approx. 20 years ago  Vaping Use   Vaping Use: Never used  Substance and Sexual Activity   Alcohol use: Yes    Alcohol/week: 0.0 standard drinks    Comment: wine occasional   Drug use: No   Sexual activity: Never  Other Topics Concern   Not on file  Social History Narrative   Widowed x 30+ years as of 12/2018    1 daughter. Retired principal. 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    Social Determinants of Radio broadcast assistant Strain: Low Risk    Difficulty of Paying Living Expenses: Not hard at all  Food Insecurity: No Food Insecurity   Worried About Charity fundraiser in the Last Year: Never true   Arboriculturist in the Last Year: Never true  Transportation Needs:  No Transportation Needs   Lack of Transportation (Medical): No   Lack of Transportation (Non-Medical): No  Physical Activity: Not on file  Stress: No Stress Concern Present   Feeling of Stress : Not at all  Social Connections: Unknown   Frequency of Communication with Friends and Family: More than three times a week   Frequency of Social Gatherings with Friends and Family: Not on file   Attends Religious Services: Not on Electrical engineer or Organizations: Not on file   Attends Archivist Meetings: Not on file   Marital Status: Not on file  Intimate Partner Violence: Not At Risk   Fear of Current or Ex-Partner: No   Emotionally Abused: No   Physically Abused: No   Sexually Abused: No    Family History:  Family History  Problem Relation Age of Onset   Stroke Father    Hypertension Mother        (a lot of animosity in their relationship)   Heart failure Mother    Gout Other        whole family    Prostate cancer Brother    Cancer Brother        prostate    Hepatitis Brother        Hep C; alcoholism (terminal)   Drug abuse Brother        died 37   Breast cancer Sister    Cancer Sister        breast cancer   Other Brother        drug addiction    Medications:   Current Outpatient Medications on File Prior to Visit  Medication Sig Dispense Refill   allopurinol (ZYLOPRIM) 100 MG tablet TAKE 2 TABLETS BY MOUTH ONCE A DAY 180 tablet 3   ALPRAZolam (XANAX) 0.25 MG tablet  Take 1 tablet (0.25 mg total) by mouth 2 (two) times daily as needed for anxiety. 60 tablet 5   amLODipine (NORVASC) 5 MG tablet Take 1 tablet (5 mg total) by mouth daily. Take additional 5 mg in pm if BP>130/>80 180 tablet 3   Ascorbic Acid (VITAMIN C PO) Take by mouth.     atorvastatin (LIPITOR) 20 MG tablet TAKE 1 TABLET (20 MG TOTAL) BY MOUTH DAILY. AT NIGHT. STOP/DISCONTINUE LOVASTATIN 20 90 tablet 3   Cholecalciferol (VITAMIN D3 PO) Take by mouth.     diclofenac Sodium (VOLTAREN)  1 % GEL Apply 2 g topically 4 (four) times daily as needed. 150 g 11   digoxin (LANOXIN) 0.125 MG tablet Take 0.5 tablets (0.0625 mg total) by mouth daily. 45 tablet 3   DULoxetine (CYMBALTA) 30 MG capsule Take 1 capsule (30 mg total) by mouth daily. 90 capsule 3   ELIQUIS 5 MG TABS tablet TAKE 1 TABLET BY MOUTH TWICE A DAY 180 tablet 1   furosemide (LASIX) 20 MG tablet Take 1 tablet (20 mg total) by mouth daily as needed. Stop spironolactone due to AKI reduce lasix 20 mg 90 tablet 3   gabapentin (NEURONTIN) 300 MG capsule Take 2 capsules (600 mg total) by mouth at bedtime. 180 capsule 5   HYDROcodone-acetaminophen (NORCO/VICODIN) 5-325 MG tablet Take 1 tablet by mouth 2 (two) times daily as needed for moderate pain. 30 tablet 0   hydrocortisone 2.5 % cream Apply topically 2 (two) times daily. 30 g 2   Magnesium 250 MG TABS Take 250 mg by mouth daily.     metoprolol succinate (TOPROL-XL) 100 MG 24 hr tablet TAKE 1 TABLET BY MOUTH DAILY WITH OR IMMEDIATLY FOLLOWING A MEAL 90 tablet 3   nystatin cream (MYCOSTATIN) Apply 1 application topically 3 (three) times daily. Prn right back 30 g 11   potassium chloride (KLOR-CON) 10 MEQ tablet Take 1 tablet (10 mEq total) by mouth daily. 90 tablet 3   No current facility-administered medications on file prior to visit.    Allergies:   Allergies  Allergen Reactions   Buspirone Hcl Other (See Comments)    REACTION: made her sleepy, ? swollen legs   Lisinopril Other (See Comments)    REACTION: dizziness   Prozac [Fluoxetine Hcl] Other (See Comments)    Sleepy as of 06/26/20 states did not make sleepy   Spironolactone     With lasix 40 mg and 12.5 spironolactone caused AKI     Physical Exam  Vitals:   11/10/20 1331  BP: (!) 154/90  Pulse: 78  Weight: 241 lb (109.3 kg)  Height: 5\' 7"  (1.702 m)    Body mass index is 37.75 kg/m. No results found.  General: Obese pleasant elderly female, seated, in no evident distress Head: head normocephalic  and atraumatic.   Neck: supple with no carotid or supraclavicular bruits Cardiovascular: regular rate and rhythm, no murmurs  Neurologic Exam Mental Status: Awake and fully alert. fluent speech and language. Oriented to place and time. Recent subjectively impaired and remote memory intact. Attention span, concentration and fund of knowledge appropriate. Mood and affect appropriate.  Cranial Nerves:  Pupils equal, briskly reactive to light. Extraocular movements full without nystagmus. Visual fields full to confrontation. Hearing intact. Facial sensation intact. Face, tongue, palate moves normally and symmetrically.  Motor: Normal bulk and tone.  Normal weakness throughout all tested extremities Sensory.:  Intact to light touch, vibratory and pinprick sensation Coordination: Rapid alternating movements normal in all extremities.  Finger-to-nose performed accurately bilaterally.   Gait and Station: Arises from chair without difficulty. Stance is slightly hunched.  Gait abnormality with lateral pelvic tilt and mild unsteadiness with use of cane Reflexes: 1+ and symmetric. Toes downgoing.        ASSESSMENT/PLAN: Monica [name] is a 85 y.o. year old female presented to [facility] ED with sudden onset aphasia and right hemiparesis on 09/01/2018 with stroke work-up revealing left MCA punctate small infarct due to left M1 occlusion status post TPA and IR with TICI 3 reperfusion embolic pattern secondary to bilateral DVT and new onset atrial flutter. Vascular risk factors include new onset atrial flutter/fibrillation, hx of bilateral DVT 08/2018, HTN, chronic diastolic HF, advanced age, former tobacco use, EtOH use, obesity and OSA.      Left MCA infarcts: Recovered without residual deficits.  Continue Eliquis (apixaban) daily and atorvastatin for secondary stroke prevention.  Discussed secondary stroke prevention measures and importance of close PCP follow-up for aggressive stroke risk factor management  including HTN with BP goal<130/90 and HLD with LDL goal<70 (LDL 45 06/2020) Memory loss: Likely multifactorial including age-related, increased stressors with worsening depression and prior stroke.  Discussed importance of stress relaxation techniques, compensation strategies and memory exercises.  Advised to discuss worsening depression with PCP.  She declines interest in counseling Persistent atrial fibrillation: Continue Eliquis along with ongoing follow-up with cardiology. Zio patch 05/2020 showed 100% A fib burden.  As asymptomatic, cards continues to monitor Chronic cervical and lumbar pain: Slight increased pain in setting of increased stressors.  Discussed possible benefit of aquatic therapy -referral placed to [facility] in [place], [place].  Pain managed by PCP currently on duloxetine, gabapentin and hydrocodone-acetaminophen   Follow up in 6 months or call earlier if needed   CC:  [facility] provider: Dr. Leonie Man McLean-Scocuzza, Nino Glow, MD     I spent 37 minutes of face-to-face and non-face-to-face time with patient.  This included previsit chart review, lab review, study review, order entry, electronic health record documentation, patient education and discussion regarding history of prior stroke, importance of managing stroke risk factors, memory loss concerns and chronic cervical and lumbar pain and answered all other questions to patient satisfaction  Frann Rider, AGNP-BC  [facility] [address] [place], [place] [address]  Phone [phone] Fax [phone] Note: This document was prepared with digital dictation and possible smart phrase technology. Any transcriptional errors that result from this process are unintentional.

## 2020-11-11 NOTE — Progress Notes (Signed)
I agree with the above plan 

## 2020-11-16 DIAGNOSIS — M7541 Impingement syndrome of right shoulder: Secondary | ICD-10-CM | POA: Diagnosis not present

## 2020-11-16 NOTE — Telephone Encounter (Signed)
Referral sent to [facility] in [place]. Phone: [phone].

## 2020-11-17 NOTE — Telephone Encounter (Signed)
[facility] called to advise that they do not do aquatic therapy at the [facility] location. I called patient to let her know. She states she is fine proceeding with regular PT, however she would like to hold off at this time due to injections she is receiving for pain. I provided patient with the number to [facility] in [place] and she states she will call them to let them know.

## 2020-11-30 DIAGNOSIS — M7541 Impingement syndrome of right shoulder: Secondary | ICD-10-CM | POA: Diagnosis not present

## 2020-12-08 ENCOUNTER — Ambulatory Visit: Payer: Medicare HMO | Admitting: Internal Medicine

## 2021-02-11 ENCOUNTER — Other Ambulatory Visit: Payer: Self-pay | Admitting: Internal Medicine

## 2021-02-11 DIAGNOSIS — I10 Essential (primary) hypertension: Secondary | ICD-10-CM

## 2021-02-17 ENCOUNTER — Ambulatory Visit (INDEPENDENT_AMBULATORY_CARE_PROVIDER_SITE_OTHER): Payer: Medicare HMO | Admitting: Family Medicine

## 2021-02-17 ENCOUNTER — Other Ambulatory Visit: Payer: Self-pay

## 2021-02-17 ENCOUNTER — Encounter: Payer: Self-pay | Admitting: Family Medicine

## 2021-02-17 VITALS — BP 115/80 | HR 93 | Temp 98.1°F | Ht 67.0 in | Wt 246.4 lb

## 2021-02-17 DIAGNOSIS — R350 Frequency of micturition: Secondary | ICD-10-CM | POA: Insufficient documentation

## 2021-02-17 DIAGNOSIS — R822 Biliuria: Secondary | ICD-10-CM

## 2021-02-17 DIAGNOSIS — F4321 Adjustment disorder with depressed mood: Secondary | ICD-10-CM | POA: Diagnosis not present

## 2021-02-17 HISTORY — DX: Frequency of micturition: R35.0

## 2021-02-17 LAB — POCT URINALYSIS DIPSTICK
Blood, UA: NEGATIVE
Glucose, UA: NEGATIVE
Leukocytes, UA: NEGATIVE
Nitrite, UA: NEGATIVE
Protein, UA: NEGATIVE
Spec Grav, UA: 1.015 (ref 1.010–1.025)
Urobilinogen, UA: 2 E.U./dL — AB
pH, UA: 6.5 (ref 5.0–8.0)

## 2021-02-17 NOTE — Assessment & Plan Note (Signed)
This is related to the situation that the patient's sister is in.  There is some depression.  They are going to confirm if the patient is taking her Cymbalta.  I did encourage the family to circle back with social services.
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
## 2021-02-17 NOTE — Assessment & Plan Note (Signed)
This is an remittent issue over the last year.  Current episode has been going on 3 weeks.  Her urinalysis is not convincing for UTI thus we will send it for a culture.  Given recurrent issues with urinary frequency we will refer her to urology.  We will treat with antibiotics if her urine culture is positive.

## 2021-02-17 NOTE — Patient Instructions (Signed)
Nice to see you. We are sending your urine for culture and we will contact you with the results. I have referred you to urology as well.

## 2021-02-18 DIAGNOSIS — Z20822 Contact with and (suspected) exposure to covid-19: Secondary | ICD-10-CM | POA: Diagnosis not present

## 2021-02-18 LAB — URINE CULTURE
MICRO NUMBER:: 12804264
SPECIMEN QUALITY:: ADEQUATE

## 2021-02-24 ENCOUNTER — Encounter: Payer: Self-pay | Admitting: Adult Health

## 2021-02-24 ENCOUNTER — Telehealth (INDEPENDENT_AMBULATORY_CARE_PROVIDER_SITE_OTHER): Payer: Medicare PPO | Admitting: Adult Health

## 2021-02-24 VITALS — Ht 67.01 in | Wt 246.0 lb

## 2021-02-24 DIAGNOSIS — K625 Hemorrhage of anus and rectum: Secondary | ICD-10-CM | POA: Insufficient documentation

## 2021-02-24 DIAGNOSIS — Z1211 Encounter for screening for malignant neoplasm of colon: Secondary | ICD-10-CM

## 2021-02-24 DIAGNOSIS — R635 Abnormal weight gain: Secondary | ICD-10-CM

## 2021-02-24 DIAGNOSIS — K219 Gastro-esophageal reflux disease without esophagitis: Secondary | ICD-10-CM | POA: Insufficient documentation

## 2021-02-24 DIAGNOSIS — R1310 Dysphagia, unspecified: Secondary | ICD-10-CM | POA: Insufficient documentation

## 2021-02-24 DIAGNOSIS — J020 Streptococcal pharyngitis: Secondary | ICD-10-CM | POA: Diagnosis not present

## 2021-02-24 DIAGNOSIS — J069 Acute upper respiratory infection, unspecified: Secondary | ICD-10-CM

## 2021-02-24 HISTORY — DX: Encounter for screening for malignant neoplasm of colon: Z12.11

## 2021-02-24 HISTORY — DX: Hemorrhage of anus and rectum: K62.5

## 2021-02-24 HISTORY — DX: Abnormal weight gain: R63.5

## 2021-02-24 LAB — POC COVID19 BINAXNOW: SARS Coronavirus 2 Ag: NEGATIVE

## 2021-02-24 LAB — POCT INFLUENZA A/B
Influenza A, POC: NEGATIVE
Influenza B, POC: NEGATIVE

## 2021-02-24 LAB — POCT RAPID STREP A (OFFICE): Rapid Strep A Screen: POSITIVE — AB

## 2021-02-24 MED ORDER — BENZONATATE 100 MG PO CAPS: 100.0000 mg | ORAL_CAPSULE | Freq: Two times a day (BID) | ORAL | 0 refills | Status: DC | PRN

## 2021-02-24 MED ORDER — AMOXICILLIN 500 MG PO CAPS: 500.0000 mg | ORAL_CAPSULE | Freq: Three times a day (TID) | ORAL | 0 refills | Status: DC

## 2021-02-24 NOTE — Addendum Note (Signed)
Addended by: Kerin Salen R on: 02/24/2021 10:00 AM   Modules accepted: Orders

## 2021-02-24 NOTE — Progress Notes (Signed)
Virtual Visit via Telephone Note  I connected with Monica [name] on 02/24/21 at 11:00 AM EST by telephone and verified that I am speaking with the correct person using two identifiers.  Location: Patient: at home  Provider: Provider: Provider's office at  [facility], [place].      I discussed the limitations, risks, security and privacy concerns of performing an evaluation and management service by telephone and the availability of in person appointments. I also discussed with the patient that there may be a patient responsible charge related to this service. The patient expressed understanding and agreed to proceed.   History of Present Illness:   Patient is an 86 year old female in no acute distress who comes to clinic by telephone visit. She has symptoms of chest congestion, nasal congestion and productive cough. Symptoms started on 02/20/21. Chest congestion was worse yesterday, and is better today.  Denies any loss of taste or smell.   Patient  denies any fever, body aches,chills, rash, chest pain, shortness of breath, nausea, vomiting, or diarrhea.  Denies dizziness, lightheadedness, pre syncopal or syncopal episodes.       has a past medical history of Allergic rhinitis, Anxiety, Benign neoplasm of breast (2013), Cardiac arrhythmia, Cardiomegaly, Cataract, CHF (congestive heart failure) ([place]), Compulsive eating patterns, Compulsive overeating, Depression, Diastolic heart failure ([place]), Diffuse cystic mastopathy (2013), Edema, Epistaxis, GI bleeding, Gout, Hiatal hernia, HTN (hypertension), Hyperlipidemia, Lump or mass in breast (2012), OA (osteoarthritis) of knee, Obesity, OSA (obstructive sleep apnea), Other abnormal glucose, Peptic stricture of esophagus, Personal history of tobacco use, presenting hazards to health, Sciatica, Sinus infection (2010), Spinal stenosis, Stroke ([place]), Unspecified sleep apnea, and UTI (urinary tract infection).    Observations/Objective:   Patient is alert and oriented and responsive to questions Engages in conversation with provider. Speaks in full sentences without any pauses without any shortness of breath or distress.   Assessment and Plan:   Upper respiratory tract infection, unspecified type - Plan: POCT Influenza A/B, POC COVID-19, POCT rapid strep A, COVID-19, Flu A+B and RSV, benzonatate (TESSALON) 100 MG capsule  Strep pharyngitis - Plan: amoxicillin (AMOXIL) 500 MG capsule   Strep throat positive in office.  GFR 52. Follow Up Instructions:   Red Flags discussed. The patient was given clear instructions to go to ER or return to medical center if any red flags develop, symptoms do not improve, worsen or new problems develop. They verbalized understanding.  Return if symptoms worsen or fail to improve, for Go to Emergency room/ urgent care if worse, at any time for any worsening symptoms.    I discussed the assessment and treatment plan with the patient. The patient was provided an opportunity to ask questions and all were answered. The patient agreed with the plan and demonstrated an understanding of the instructions.   The patient was advised to call back or seek an in-person evaluation if the symptoms worsen or if the condition fails to improve as anticipated.  I provided 21 minutes of non-face-to-face time during this encounter.   Marcille Buffy, FNP

## 2021-02-24 NOTE — Patient Instructions (Addendum)
Pharyngitis Pharyngitis is a sore throat (pharynx). This is when there is redness, pain, and swelling in your throat. Most of the time, this condition gets better on its own. In some cases, you may need medicine. What are the causes? An infection from a virus. An infection from bacteria. Allergies. What increases the risk? Being 84-84 years old. Being in crowded environments. These include: Daycares. Schools. Dormitories. Living in a place with cold temperatures outside. Having a weakened disease-fighting (immune) system. What are the signs or symptoms? Symptoms may vary depending on the cause. Common symptoms include: Sore throat. Tiredness (fatigue). Low-grade fever. Stuffy nose. Cough. Headache. Other symptoms may include: Glands in the neck (lymph nodes) that are swollen. Skin rashes. Film on the throat or tonsils. This can be caused by an infection from bacteria. Vomiting. Red, itchy eyes. Loss of appetite. Joint pain and muscle aches. Tonsils that are temporarily bigger than usual (enlarged). How is this treated? Many times, treatment is not needed. This condition usually gets better in 3-4 days without treatment. If the infection is caused by a bacteria, you may be need to take antibiotics. Follow these instructions at home: Medicines Take over-the-counter and prescription medicines only as told by your doctor. If you were prescribed an antibiotic medicine, take it as told by your doctor. Do not stop taking the antibiotic even if you start to feel better. Use throat lozenges or sprays to soothe your throat as told by your doctor. Children can get pharyngitis. Do not give your child aspirin. Managing pain To help with pain, try: Sipping warm liquids, such as: Broth. Herbal tea. Warm water. Eating or drinking cold or frozen liquids, such as frozen ice pops. Rinsing your mouth (gargle) with a salt water mixture 3-4 times a day or as needed. To make salt water,  dissolve -1 tsp (3-6 g) of salt in 1 cup (237 mL) of warm water. Do not swallow this mixture. Sucking on hard candy or throat lozenges. Putting a cool-mist humidifier in your bedroom at night to moisten the air. Sitting in the bathroom with the door closed for 5-10 minutes while you run hot water in the shower.  General instructions  Do not smoke or use any products that contain nicotine or tobacco. If you need help quitting, ask your doctor. Rest as told by your doctor. Drink enough fluid to keep your pee (urine) pale yellow. How is this prevented? Wash your hands often for at least 20 seconds with soap and water. If soap and water are not available, use hand sanitizer. Do not touch your eyes, nose, or mouth with unwashed hands. Wash hands after touching these areas. Do not share cups or eating utensils. Avoid close contact with people who are sick. Contact a doctor if: You have large, tender lumps in your neck. You have a rash. You cough up green, yellow-brown, or bloody spit. Get help right away if: You have a stiff neck. You drool or cannot swallow liquids. You cannot drink or take medicines without vomiting. You have very bad pain that does not go away with medicine. You have problems breathing, and it is not from a stuffy nose. You have new pain and swelling in your knees, ankles, wrists, or elbows. These symptoms may be an emergency. Get help right away. Call your local emergency services (911 in the [place] Do not wait to see if the symptoms will go away. Do not drive yourself to the hospital. Summary Pharyngitis is a sore throat (pharynx). This is  when there is redness, pain, and swelling in your throat. Most of the time, pharyngitis gets better on its own. Sometimes, you may need medicine. If you were prescribed an antibiotic medicine, take it as told by your doctor. Do not stop taking the antibiotic even if you start to feel better. This information is not intended to  replace advice given to you by your health care provider. Make sure you discuss any questions you have with your health care provider. Document Revised: 05/06/2020 Document Reviewed: 05/06/2020 Elsevier Patient Education  [place]. Amoxicillin Capsules or Tablets What is this medication? AMOXICILLIN (a mox i SIL in) treats infections caused by bacteria. It belongs to a group of medications called penicillin antibiotics. It will not treat colds, the flu, or infections caused by viruses. This medicine may be used for other purposes; ask your health care provider or pharmacist if you have questions. COMMON BRAND NAME(S): Amoxil, Moxilin, Sumox, Trimox What should I tell my care team before I take this medication? They need to know if you have any of these conditions: Kidney disease An unusual or allergic reaction to amoxicillin, other penicillins, cephalosporin antibiotics, other medications, foods, dyes, or preservatives Pregnant or trying to get pregnant Breast-feeding How should I use this medication? Take this medication by mouth with a glass of water. Follow the directions on your prescription label. You can take it with or without food. If it upsets your stomach, take it with food. Take your medication at regular intervals. Do not take your medication more often than directed. Take all of your medication as directed even if you think you are better. Do not skip doses or stop your medication early. Talk to your pediatrician regarding the use of this medication in children. While this medication may be prescribed for selected conditions, precautions do apply. Overdosage: If you think you have taken too much of this medicine contact a poison control center or emergency room at once. NOTE: This medicine is only for you. Do not share this medicine with others. What if I miss a dose? If you miss a dose, take it as soon as you can. If it is almost time for your next dose, take only that  dose. Do not take double or extra doses. What may interact with this medication? Allopurinol Birth control pills Certain antibiotics like chloramphenicol, erythromycin, sulfamethoxazole, tetracycline Certain medications that treat or prevent blood clots like warfarin This list may not describe all possible interactions. Give your health care provider a list of all the medicines, herbs, non-prescription drugs, or dietary supplements you use. Also tell them if you smoke, drink alcohol, or use illegal drugs. Some items may interact with your medicine. What should I watch for while using this medication? Tell your care team if your symptoms do not start to get better or if they get worse. Do not treat diarrhea with over the counter products. Contact your health care professional if you have diarrhea that lasts more than 2 days or if it is severe and watery. If you have diabetes, you may get a false-positive result for sugar in your urine. Check with your health care professional. Birth control may not work properly while you are taking this medication. Talk to your health care professional about using an extra method of birth control. This medication may cause serious skin reactions. They can happen weeks to months after starting the medication. Contact your health care provider right away if you notice fevers or flu-like symptoms with a rash.  The rash may be red or purple and then turn into blisters or peeling of the skin. Or, you might notice a red rash with swelling of the face, lips or lymph nodes in your neck or under your arms. What side effects may I notice from receiving this medication? Side effects that you should report to your care team as soon as possible: Allergic reactions--skin rash, itching, hives, swelling of the face, lips, tongue, or throat Redness, blistering, peeling, or loosening of the skin, including inside the mouth Severe diarrhea, fever Unusual vaginal discharge, itching, or  odor Side effects that usually do not require medical attention (report to your care team if they continue or are bothersome): Diarrhea Headache Nausea Vomiting This list may not describe all possible side effects. Call your doctor for medical advice about side effects. You may report side effects to FDA at 1-800-FDA-1088. Where should I keep my medication? Keep out of the reach of children. Store at room temperature between 20 and 25 degrees C (68 and 77 degrees F). Throw away any unused medication after the expiration date. NOTE: This sheet is a summary. It may not cover all possible information. If you have questions about this medicine, talk to your doctor, pharmacist, or health care provider.  2022 Elsevier/Gold Standard (2020-02-18 00:00:00)   Upper Respiratory Infection, Adult An upper respiratory infection (URI) affects the nose, throat, and upper airways that lead to the lungs. The most common type of URI is often called the common cold. URIs usually get better on their own, without medical treatment. What are the causes? A URI is caused by a germ (virus). You may catch these germs by: Breathing in droplets from an infected person's cough or sneeze. Touching something that has the germ on it (is contaminated) and then touching your mouth, nose, or eyes. What increases the risk? You are more likely to get a URI if: You are very young or very old. You have close contact with others, such as at work, school, or a health care facility. You smoke. You have long-term (chronic) heart or lung disease. You have a weakened disease-fighting system (immune system). You have nasal allergies or asthma. You have a lot of stress. You have poor nutrition. What are the signs or symptoms? Runny or stuffy (congested) nose. Cough. Sneezing. Sore throat. Headache. Feeling tired (fatigue). Fever. Not wanting to eat as much as usual. Pain in your forehead, behind your eyes, and over your  cheekbones (sinus pain). Muscle aches. Redness or irritation of the eyes. Pressure in the ears or face. How is this treated? URIs usually get better on their own within 7-10 days. Medicines cannot cure URIs, but your doctor may recommend certain medicines to help relieve symptoms, such as: Over-the-counter cold medicines. Medicines to reduce coughing (cough suppressants). Coughing is a type of defense against infection that helps to clear the nose, throat, windpipe, and lungs (respiratory system). Take these medicines only as told by your doctor. Medicines to lower your fever. Follow these instructions at home: Activity Rest as needed. If you have a fever, stay home from work or school until your fever is gone, or until your doctor says you may return to work or school. You should stay home until you cannot spread the infection anymore (you are not contagious). Your doctor may have you wear a face mask so you have less risk of spreading the infection. Relieving symptoms Rinse your mouth often with salt water. To make salt water, dissolve -1 tsp (3-6  g) of salt in 1 cup (237 mL) of warm water. Use a cool-mist humidifier to add moisture to the air. This can help you breathe more easily. Eating and drinking  Drink enough fluid to keep your pee (urine) pale yellow. Eat soups and other clear broths. General instructions  Take over-the-counter and prescription medicines only as told by your doctor. Do not smoke or use any products that contain nicotine or tobacco. If you need help quitting, ask your doctor. Avoid being where people are smoking (avoid secondhand smoke). Stay up to date on all your shots (immunizations), and get the flu shot every year. Keep all follow-up visits. How to prevent the spread of infection to others  Wash your hands with soap and water for at least 20 seconds. If you cannot use soap and water, use hand sanitizer. Avoid touching your mouth, face, eyes, or  nose. Cough or sneeze into a tissue or your sleeve or elbow. Do not cough or sneeze into your hand or into the air. Contact a doctor if: You are getting worse, not better. You have any of these: A fever or chills. Brown or red mucus in your nose. Yellow or brown fluid (discharge)coming from your nose. Pain in your face, especially when you bend forward. Swollen neck glands. Pain when you swallow. White areas in the back of your throat. Get help right away if: You have shortness of breath that gets worse. You have very bad or constant: Headache. Ear pain. Pain in your forehead, behind your eyes, and over your cheekbones (sinus pain). Chest pain. You have long-lasting (chronic) lung disease along with any of these: Making high-pitched whistling sounds when you breathe, most often when you breathe out (wheezing). Long-lasting cough (more than 14 days). Coughing up blood. A change in your usual mucus. You have a stiff neck. You have changes in your: Vision. Hearing. Thinking. Mood. These symptoms may be an emergency. Get help right away. Call 911. Do not wait to see if the symptoms will go away. Do not drive yourself to the hospital. Summary An upper respiratory infection (URI) is caused by a germ (virus). The most common type of URI is often called the common cold. URIs usually get better within 7-10 days. Take over-the-counter and prescription medicines only as told by your doctor. This information is not intended to replace advice given to you by your health care provider. Make sure you discuss any questions you have with your health care provider. Document Revised: 09/09/2020 Document Reviewed: 09/09/2020 Elsevier Patient Education  [place].

## 2021-02-24 NOTE — Progress Notes (Signed)
Strep positive throat.sent Amoxicillin. Negative POCT flu,rsv in office.  Follow treatment plan from office  if not improving or any worsening within 72 hours and also return to office or open medical facility at ANYTIME if any symptoms persist, change, or worsen or you have any further concerns or questions. Call 911 immediately for emergencies.

## 2021-02-26 ENCOUNTER — Ambulatory Visit (INDEPENDENT_AMBULATORY_CARE_PROVIDER_SITE_OTHER): Payer: Medicare PPO | Admitting: Interventional Cardiology

## 2021-02-26 ENCOUNTER — Encounter: Payer: Self-pay | Admitting: Interventional Cardiology

## 2021-02-26 ENCOUNTER — Other Ambulatory Visit: Payer: Self-pay

## 2021-02-26 VITALS — BP 100/68 | HR 90 | Ht 67.01 in | Wt 244.2 lb

## 2021-02-26 DIAGNOSIS — I10 Essential (primary) hypertension: Secondary | ICD-10-CM

## 2021-02-26 DIAGNOSIS — Z79899 Other long term (current) drug therapy: Secondary | ICD-10-CM | POA: Diagnosis not present

## 2021-02-26 DIAGNOSIS — Z7901 Long term (current) use of anticoagulants: Secondary | ICD-10-CM

## 2021-02-26 DIAGNOSIS — I4892 Unspecified atrial flutter: Secondary | ICD-10-CM | POA: Diagnosis not present

## 2021-02-26 DIAGNOSIS — E7849 Other hyperlipidemia: Secondary | ICD-10-CM | POA: Diagnosis not present

## 2021-02-26 LAB — COVID-19, FLU A+B AND RSV
Influenza A, NAA: NOT DETECTED
Influenza B, NAA: NOT DETECTED
RSV, NAA: NOT DETECTED
SARS-CoV-2, NAA: NOT DETECTED

## 2021-02-26 NOTE — Progress Notes (Signed)
Strep positive. On amoxicillin.  Follow treatment plan from office  if not improving or any worsening within 72 hours and also return to office or open medical facility at ANYTIME if any symptoms persist, change, or worsen or you have any further concerns or questions. Call 911 immediately for emergencies.

## 2021-02-26 NOTE — Patient Instructions (Signed)
Medication Instructions:  ?Your physician recommends that you continue on your current medications as directed. Please refer to the Current Medication list given to you today. ? ?*If you need a refill on your cardiac medications before your next appointment, please call your pharmacy* ? ? ?Lab Work: ?None ?If you have labs (blood work) drawn today and your tests are completely normal, you will receive your results only by: ?MyChart Message (if you have MyChart) OR ?A paper copy in the mail ?If you have any lab test that is abnormal or we need to change your treatment, we will call you to review the results. ? ? ?Testing/Procedures: ?None ? ? ?Follow-Up: ?At [facility], you and your health needs are our priority.  As part of our continuing mission to provide you with exceptional heart care, we have created designated Provider Care Teams.  These Care Teams include your primary Cardiologist (physician) and Advanced Practice Providers (APPs -  Physician Assistants and Nurse Practitioners) who all work together to provide you with the care you need, when you need it. ? ?We recommend signing up for the patient portal called "MyChart".  Sign up information is provided on this After Visit Summary.  MyChart is used to connect with patients for Virtual Visits (Telemedicine).  Patients are able to view lab/test results, encounter notes, upcoming appointments, etc.  Non-urgent messages can be sent to your provider as well.   ?To learn more about what you can do with MyChart, go to [email or web address].   ? ?Your next appointment:   ?9-12 month(s) ? ?The format for your next appointment:   ?In Person ? ?Provider:   ?Henry W Smith III, MD  ? ? ?Other Instructions ?  ?

## 2021-02-26 NOTE — Progress Notes (Signed)
Cardiology Office Note:    Date:  02/26/2021   ID:  Monica [name], DOB 05-24-1935, MRN [id number]  PCP:  McLean-Scocuzza, Nino Glow, MD  Cardiologist:  Sinclair Grooms, MD   Referring MD: McLean-Scocuzza, Olivia Mackie *   Chief Complaint  Patient presents with   Congestive Heart Failure   Atrial Fibrillation    History of Present Illness:    Monica [name] is a 86 y.o. female with a hx of  Hypertension, chromic diastolic HF, CVA (embolic), pre-diabetes, GI bleeding, anticoagulation and amiodarone therapy.   Dietary indiscretion is been a major problem.  She has been under stress related to the illness of her sister.  She is [place] orthopnea.  She does get dyspnea on exertion but claims that it is not different now than it was 6 to 12 months ago.  No chest pain, orthopnea, PND.  Past Medical History:  Diagnosis Date   Allergic rhinitis    Anxiety    Benign neoplasm of breast 2013   Cardiac arrhythmia    Cardiomegaly    Cataract    CHF (congestive heart failure) (HCC)    Compulsive eating patterns    Compulsive overeating    Depression    Diastolic heart failure (HCC)    LVH with free wall thickness 1.4 cm EF 60% Dr. Tamala Julian    Diffuse cystic mastopathy 2013   Edema    Epistaxis    noted 12/20/18    GI bleeding    in ~2018    Gout    Hiatal hernia    HTN (hypertension)    Hyperlipidemia    Lump or mass in breast 2012   OA (osteoarthritis) of knee    with injections   Obesity    OSA (obstructive sleep apnea)    not on cpap   Other abnormal glucose    Peptic stricture of esophagus    Personal history of tobacco use, presenting hazards to health    Sciatica    Sinus infection 2010   Spinal stenosis    lumbar    Stroke ([place])    Unspecified sleep apnea    UTI (urinary tract infection)    e coli 06/2020    Past Surgical History:  Procedure Laterality Date   adenosine cardiolite  1/08   low risk    APPENDECTOMY     BREAST BIOPSY     02/28/11 negative    BREAST LUMPECTOMY     right x1 ('89) left x2 ('70s, '90)   CARDIAC CATHETERIZATION  2001   normal per pt   COLONOSCOPY  11/02   diverticulosis   COLONOSCOPY  9/06   diverticulosis, hemorhoids   COLONOSCOPY N/A 10/01/2016   Procedure: COLONOSCOPY;  Surgeon: Gatha Mayer, MD;  Location: [facility];  Service: Endoscopy;  Laterality: N/A;   dexa  3/02   normal   EYE SURGERY  2012   cataract   IR CT HEAD LTD  09/01/2018   IR PERCUTANEOUS ART THROMBECTOMY/INFUSION INTRACRANIAL INC DIAG ANGIO  09/01/2018   JOINT REPLACEMENT     knee b/l 2011   knee replacement Right 9/11   R. Dr. Telford Nab   RADIOLOGY WITH ANESTHESIA N/A 09/01/2018   Procedure: IR WITH ANESTHESIA;  Surgeon: Luanne Bras, MD;  Location: [facility];  Service: Radiology;  Laterality: N/A;   sleep study  5/05   TONSILLECTOMY     TOTAL ABDOMINAL HYSTERECTOMY     fibroids, age of 37   TOTAL KNEE ARTHROPLASTY  Left 2012   US TRANSVAGINAL PELVIC MODIFIED  2000, 2001    Current Medications: Current Meds  Medication Sig   allopurinol (ZYLOPRIM) 100 MG tablet TAKE 2 TABLETS BY MOUTH ONCE A DAY   ALPRAZolam (XANAX) 0.25 MG tablet Take 1 tablet (0.25 mg total) by mouth 2 (two) times daily as needed for anxiety.   amLODipine (NORVASC) 5 MG tablet Take 1 tablet (5 mg total) by mouth daily. Take additional 5 mg in pm if BP>130/>80   amoxicillin-clavulanate (AUGMENTIN) 875-125 MG tablet Take 1 tablet by mouth in the morning and at bedtime.   Ascorbic Acid (VITAMIN C PO) Take by mouth.   atorvastatin (LIPITOR) 20 MG tablet TAKE 1 TABLET (20 MG TOTAL) BY MOUTH DAILY. AT NIGHT. STOP/DISCONTINUE LOVASTATIN 20   benzonatate (TESSALON) 100 MG capsule Take 1 capsule (100 mg total) by mouth 2 (two) times daily as needed for cough.   Cholecalciferol (VITAMIN D3 PO) Take by mouth.   diclofenac Sodium (VOLTAREN) 1 % GEL Apply 2 g topically 4 (four) times daily as needed.   digoxin (LANOXIN) 0.125 MG tablet Take 0.5 tablets (0.0625 mg total) by  mouth daily.   DULoxetine (CYMBALTA) 30 MG capsule Take 1 capsule (30 mg total) by mouth daily.   ELIQUIS 5 MG TABS tablet TAKE 1 TABLET BY MOUTH TWICE A DAY   furosemide (LASIX) 20 MG tablet Take 1 tablet (20 mg total) by mouth daily as needed. Stop spironolactone due to AKI reduce lasix 20 mg   gabapentin (NEURONTIN) 300 MG capsule Take 2 capsules (600 mg total) by mouth at bedtime.   HYDROcodone-acetaminophen (NORCO/VICODIN) 5-325 MG tablet Take 1 tablet by mouth 2 (two) times daily as needed for moderate pain.   hydrocortisone 2.5 % cream Apply topically 2 (two) times daily.   Magnesium 250 MG TABS Take 250 mg by mouth daily.   metoprolol succinate (TOPROL-XL) 100 MG 24 hr tablet TAKE 1 TABLET BY MOUTH DAILY WITH OR IMMEDIATLY FOLLOWING A MEAL   nystatin cream (MYCOSTATIN) Apply 1 application topically 3 (three) times daily. Prn right back   potassium chloride (KLOR-CON) 10 MEQ tablet Take 1 tablet (10 mEq total) by mouth daily.   [DISCONTINUED] amoxicillin (AMOXIL) 500 MG capsule Take 1 capsule (500 mg total) by mouth 3 (three) times daily for 10 days.     Allergies:   Buspirone hcl, Lisinopril, Prozac [fluoxetine hcl], and Spironolactone   Social History   Socioeconomic History   Marital status: Widowed    Spouse name: Not on file   Number of children: 1   Years of education: Not on file   Highest education level: Not on file  Occupational History   Occupation: Retired school principal    Employer: RETIRED  Tobacco Use   Smoking status: Former    Packs/day: 0.50    Years: 20.00    Pack years: 10.00    Types: Cigarettes   Smokeless tobacco: Never   Tobacco comments:    quit approx. 20 years ago  Vaping Use   Vaping Use: Never used  Substance and Sexual Activity   Alcohol use: Yes    Alcohol/week: 0.0 standard drinks    Comment: wine occasional   Drug use: No   Sexual activity: Never  Other Topics Concern   Not on file  Social History Narrative   Widowed x 30+ years  as of 12/2018    1 daughter. Retired principal. 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    Social Determinants of Radio broadcast assistant Strain: Low Risk    Difficulty of Paying Living Expenses: Not hard at all  Food Insecurity: No Food Insecurity   Worried About Charity fundraiser in the Last Year: Never true   Arboriculturist in the Last Year: Never true  Transportation Needs: No Transportation Needs   Lack of Transportation (Medical): No   Lack of Transportation (Non-Medical): No  Physical Activity: Not on file  Stress: No Stress Concern Present   Feeling of Stress : Not at all  Social Connections: Unknown   Frequency of Communication with Friends and Family: More than three times a week   Frequency of Social Gatherings with Friends and Family: Not on file   Attends Religious Services: Not on file   Active Member of Clubs or Organizations: Not on file   Attends Archivist Meetings: Not on file   Marital Status: Not on file     Family History: The patient's family history includes Breast cancer in her sister; Cancer in her brother and sister; Drug abuse in her brother; Gout in an other family member; Heart failure in her mother; Hepatitis in her brother; Hypertension in her mother; Other in her brother; Prostate cancer in her brother; Stroke in her father.  ROS:   Please see the history of present illness.    She does not have blood in the urine or stool.  Drinks many soft drinks and eats potato chips when under stress.  Her sister is on hospice care at [facility].  All other systems reviewed and are negative.  EKGs/Labs/Other Studies Reviewed:    The following studies were reviewed today: Cardiac monitor April 2022: Study Highlights    Continuous atrial flutter with variable AV conduction with HR range 59-135 bpm. Ave HR 84m bpm.     Patch Wear Time:  3 days and 18 hours (2022-04-20T20:54:09-398 to  2022-04-24T14:59:51-0400)   Atrial Fibrillation occurred continuously (100% burden), ranging from 59-135 bpm (avg of 78 bpm). Isolated VEs were rare (<1.0%), VE Couplets were rare (<1.0%), and no VE Triplets were present.   EKG:  EKG not repeated  Recent Labs: 07/14/2020: ALT 12; Hemoglobin 11.6; Platelets 216.0 08/05/2020: BUN 19; Creat 1.14; Potassium 4.3; Sodium 145  Recent Lipid Panel    Component Value Date/Time   CHOL 93 07/14/2020 1116   TRIG 75.0 07/14/2020 1116   HDL 33.50 (L) 07/14/2020 1116   CHOLHDL 3 07/14/2020 1116   VLDL 15.0 07/14/2020 1116   LDLCALC 45 07/14/2020 1116   LDLDIRECT 131.8 12/09/2009 1643    Physical Exam:    VS:  BP 100/68    Pulse 90    Ht 5' 7.01" (1.702 m)    Wt 244 lb 2.9 oz (110.8 kg)    SpO2 97%    BMI 38.23 kg/m     Wt Readings from Last 3 Encounters:  02/26/21 244 lb 2.9 oz (110.8 kg)  02/24/21 246 lb (111.6 kg)  02/17/21 246 lb 6.4 oz (111.8 kg)     GEN: Obese. No acute distress HEENT: Normal NECK: No JVD. LYMPHATICS: No lymphadenopathy CARDIAC: No murmur. IRR no gallop, or edema. VASCULAR:  Normal Pulses. No bruits. RESPIRATORY:  Clear to auscultation without rales, wheezing or rhonchi  ABDOMEN: Soft, non-tender, non-distended, No pulsatile mass, MUSCULOSKELETAL: No deformity  SKIN: Warm and dry NEUROLOGIC:  Alert and oriented x 3 PSYCHIATRIC:  Normal affect   ASSESSMENT:  1. Atrial flutter, unspecified type ([place])   2. Essential hypertension   3. Other hyperlipidemia   4. Chronic anticoagulation   5. High risk medication use    PLAN:    In order of problems listed above:  Controlled rate.  Tolerating anticoagulation without difficulty.  Prior stroke. Blood pressure is adequate.  Decrease salt in diet. Continue Lipitor 20 mg/day.  Prior stroke. Continue Eliquis therapy.  No aspirin recommended.  History of prior stroke. Amiodarone has been discontinued  Overall education and awareness concerning secondary risk  prevention was discussed in detail: LDL less than 70, hemoglobin A1c less than 7, blood pressure target less than 130/80 mmHg, >150 minutes of moderate aerobic activity per week, avoidance of smoking, weight control (via diet and exercise), and continued surveillance/management of/for obstructive sleep apnea.    Medication Adjustments/Labs and Tests Ordered: Current medicines are reviewed at length with the patient today.  Concerns regarding medicines are outlined above.  No orders of the defined types were placed in this encounter.  No orders of the defined types were placed in this encounter.   Patient Instructions  Medication Instructions:  Your physician recommends that you continue on your current medications as directed. Please refer to the Current Medication list given to you today.  *If you need a refill on your cardiac medications before your next appointment, please call your pharmacy*   Lab Work: None If you have labs (blood work) drawn today and your tests are completely normal, you will receive your results only by: [place] (if you have MyChart) OR A paper copy in the mail If you have any lab test that is abnormal or we need to change your treatment, we will call you to review the results.   Testing/Procedures: None   Follow-Up: At [facility], you and your health needs are our priority.  As part of our continuing mission to provide you with exceptional heart care, we have created designated Provider Care Teams.  These Care Teams include your primary Cardiologist (physician) and Advanced Practice Providers (APPs -  Physician Assistants and Nurse Practitioners) who all work together to provide you with the care you need, when you need it.  We recommend signing up for the patient portal called "MyChart".  Sign up information is provided on this After Visit Summary.  MyChart is used to connect with patients for Virtual Visits (Telemedicine).  Patients are able to  view lab/test results, encounter notes, upcoming appointments, etc.  Non-urgent messages can be sent to your provider as well.   To learn more about what you can do with MyChart, go to [email or web address].    Your next appointment:   9-12 month(s)  The format for your next appointment:   In Person  Provider:   Sinclair Grooms, MD     Other Instructions     Signed, Sinclair Grooms, MD  02/26/2021 3:57 PM    [place]

## 2021-03-01 ENCOUNTER — Other Ambulatory Visit (INDEPENDENT_AMBULATORY_CARE_PROVIDER_SITE_OTHER): Payer: Medicare PPO

## 2021-03-01 ENCOUNTER — Other Ambulatory Visit: Payer: Self-pay

## 2021-03-01 DIAGNOSIS — R822 Biliuria: Secondary | ICD-10-CM | POA: Diagnosis not present

## 2021-03-01 LAB — COMPREHENSIVE METABOLIC PANEL
ALT: 16 U/L (ref 0–35)
AST: 24 U/L (ref 0–37)
Albumin: 4 g/dL (ref 3.5–5.2)
Alkaline Phosphatase: 106 U/L (ref 39–117)
BUN: 15 mg/dL (ref 6–23)
CO2: 28 mEq/L (ref 19–32)
Calcium: 9.7 mg/dL (ref 8.4–10.5)
Chloride: 105 mEq/L (ref 96–112)
Creatinine, Ser: 0.98 mg/dL (ref 0.40–1.20)
GFR: 53.38 mL/min — ABNORMAL LOW (ref >60.00)
Glucose, Bld: 107 mg/dL — ABNORMAL HIGH (ref 70–99)
Potassium: 3.7 mEq/L (ref 3.5–5.1)
Sodium: 140 mEq/L (ref 135–145)
Total Bilirubin: 0.5 mg/dL (ref 0.2–1.2)
Total Protein: 7.2 g/dL (ref 6.0–8.3)

## 2021-03-31 ENCOUNTER — Telehealth: Payer: Self-pay | Admitting: Internal Medicine

## 2021-03-31 NOTE — Telephone Encounter (Signed)
Patient called about her urology referral that was ordered in December.

## 2021-04-02 ENCOUNTER — Ambulatory Visit: Payer: Medicare HMO

## 2021-04-12 ENCOUNTER — Other Ambulatory Visit: Payer: Self-pay | Admitting: Interventional Cardiology

## 2021-04-12 ENCOUNTER — Other Ambulatory Visit: Payer: Self-pay | Admitting: Internal Medicine

## 2021-04-12 DIAGNOSIS — M109 Gout, unspecified: Secondary | ICD-10-CM

## 2021-04-12 DIAGNOSIS — G8929 Other chronic pain: Secondary | ICD-10-CM

## 2021-04-21 ENCOUNTER — Other Ambulatory Visit: Payer: Self-pay

## 2021-04-21 ENCOUNTER — Encounter: Payer: Self-pay | Admitting: Internal Medicine

## 2021-04-21 ENCOUNTER — Ambulatory Visit (INDEPENDENT_AMBULATORY_CARE_PROVIDER_SITE_OTHER): Payer: Medicare Other | Admitting: Internal Medicine

## 2021-04-21 VITALS — BP 122/70 | HR 87 | Temp 97.7°F | Ht 67.01 in | Wt 247.2 lb

## 2021-04-21 DIAGNOSIS — J069 Acute upper respiratory infection, unspecified: Secondary | ICD-10-CM

## 2021-04-21 DIAGNOSIS — M25511 Pain in right shoulder: Secondary | ICD-10-CM

## 2021-04-21 DIAGNOSIS — R600 Localized edema: Secondary | ICD-10-CM | POA: Diagnosis not present

## 2021-04-21 DIAGNOSIS — E876 Hypokalemia: Secondary | ICD-10-CM

## 2021-04-21 DIAGNOSIS — R609 Edema, unspecified: Secondary | ICD-10-CM

## 2021-04-21 DIAGNOSIS — I10 Essential (primary) hypertension: Secondary | ICD-10-CM | POA: Diagnosis not present

## 2021-04-21 DIAGNOSIS — N3281 Overactive bladder: Secondary | ICD-10-CM

## 2021-04-21 DIAGNOSIS — M25512 Pain in left shoulder: Secondary | ICD-10-CM | POA: Diagnosis not present

## 2021-04-21 MED ORDER — GEMTESA 75 MG PO TABS: 1.0000 | ORAL_TABLET | Freq: Every day | ORAL | 3 refills | Status: DC

## 2021-04-21 MED ORDER — FUROSEMIDE 20 MG PO TABS: 40.0000 mg | ORAL_TABLET | Freq: Every day | ORAL | 3 refills | Status: DC | PRN

## 2021-04-21 MED ORDER — POTASSIUM CHLORIDE ER 10 MEQ PO CPCR: 20.0000 meq | ORAL_CAPSULE | Freq: Two times a day (BID) | ORAL | 3 refills | Status: DC

## 2021-04-21 MED ORDER — FUROSEMIDE 40 MG PO TABS: 40.0000 mg | ORAL_TABLET | Freq: Every day | ORAL | 3 refills | Status: DC

## 2021-04-21 MED ORDER — FUROSEMIDE 40 MG PO TABS: 40.0000 mg | ORAL_TABLET | Freq: Every day | ORAL | 3 refills | Status: AC

## 2021-04-21 NOTE — Patient Instructions (Addendum)
Gemtesa for overactive bladder ? ?Potassium take 2 pills changed to capsules  ? ? ?Call back Monday about you legs  ? ?Over the counter meds for cold ?Mucinex dm green label for cough or robitussin DM  ?Multivitamin or below vitamins  ?Vitamin C 1000 mg daily.  ?Vitamin D3 4000 Iu (units) daily.  ?Zinc 100 mg daily.  ?Quercetin 250-500 mg 2 times per day   ?Elderberry  ?Oil of oregano  ?cepacol or chloroseptic spray ?Warm salt water gargles +hydrogen peroxide ?Sugar free cough drops  ?Warm tea with honey and lemon  ?Hydration  ? ?Nasal saline 2 sprays for nasal congestion  ? ?Use compression stockings and lasix 40 mg qd ? ?Edema ?Edema is an abnormal buildup of fluids in the body tissues and under the skin. Swelling of the legs, feet, and ankles is a common symptom that becomes more likely as you get older. Swelling is also common in looser tissues, like around the eyes. When the affected area is squeezed, the fluid may move out of that spot and leave a dent for a few moments. This dent is called pitting edema. ?There are many possible causes of edema. Eating too much salt (sodium) and being on your feet or sitting for a long time can cause edema in your legs, feet, and ankles. Hot weather may make edema worse. Common causes of edema include: ?Heart failure. ?Liver or kidney disease. ?Weak leg blood vessels. ?Cancer. ?An injury. ?Pregnancy. ?Medicines. ?Being obese. ?Low protein levels in the blood. ?Edema is usually painless. Your skin may look swollen or shiny. ?Follow these instructions at home: ?Keep the affected body part raised (elevated) above the level of your heart when you are sitting or lying down. ?Do not sit still or stand for long periods of time. ?Do not wear tight clothing. Do not wear garters on your upper legs. ?Exercise your legs to get your circulation going. This helps to move the fluid back into your blood vessels, and it may help the swelling go down. ?Wear elastic bandages or support  stockings to reduce swelling as told by your health care provider. ?Eat a low-salt (low-sodium) diet to reduce fluid as told by your health care provider. ?Depending on the cause of your swelling, you may need to limit how much fluid you drink (fluid restriction). ?Take over-the-counter and prescription medicines only as told by your health care provider. ?Contact a health care provider if: ?Your edema does not get better with treatment. ?You have heart, liver, or kidney disease and have symptoms of edema. ?You have sudden and unexplained weight gain. ?Get help right away if: ?You develop shortness of breath or chest pain. ?You cannot breathe when you lie down. ?You develop pain, redness, or warmth in the swollen areas. ?You have heart, liver, or kidney disease and suddenly get edema. ?You have a fever and your symptoms suddenly get worse. ?Summary ?Edema is an abnormal buildup of fluids in the body tissues and under the skin. ?Eating too much salt (sodium) and being on your feet or sitting for a long time can cause edema in your legs, feet, and ankles. ?Keep the affected body part raised (elevated) above the level of your heart when you are sitting or lying down. ?This information is not intended to replace advice given to you by your health care provider. Make sure you discuss any questions you have with your health care provider. ?Document Revised: 07/09/2020 Document Reviewed: 12/03/2019 ?Elsevier Patient Education ? [place]. ? ?

## 2021-04-21 NOTE — Progress Notes (Signed)
Chief Complaint  Patient presents with   Foot Swelling   F/u  1. Chronic leg swelling restarted lasix 40 mg having trouble getting shoes on not using compression stockings worse swelling x 2 weeks  2. C/o b/l shoulder pain L>R resolved  3. URI resolved had phelgm yellow daughter was sick as well feeling better  4. C/o overactive bladder not heard urology appt since 01/2022   Review of Systems  Constitutional:  Negative for weight loss.  HENT:  Negative for hearing loss.   Eyes:  Negative for blurred vision.  Respiratory:  Negative for shortness of breath.   Cardiovascular:  Negative for chest pain.  Gastrointestinal:  Negative for abdominal pain and blood in stool.  Genitourinary:  Positive for frequency. Negative for dysuria.  Musculoskeletal:  Negative for falls and joint pain.  Skin:  Negative for rash.  Neurological:  Negative for headaches.  Psychiatric/Behavioral:  Negative for depression.   Past Medical History:  Diagnosis Date   Allergic rhinitis    Anxiety    Benign neoplasm of breast 2013   Cardiac arrhythmia    Cardiomegaly    Cataract    CHF (congestive heart failure) (HCC)    Compulsive eating patterns    Compulsive overeating    Depression    Diastolic heart failure (HCC)    LVH with free wall thickness 1.4 cm EF 60% Dr. Tamala Julian    Diffuse cystic mastopathy 2013   Edema    Epistaxis    noted 12/20/18    GI bleeding    in ~2018    Gout    Hiatal hernia    HTN (hypertension)    Hyperlipidemia    Lump or mass in breast 2012   OA (osteoarthritis) of knee    with injections   Obesity    OSA (obstructive sleep apnea)    not on cpap   Other abnormal glucose    Peptic stricture of esophagus    Personal history of tobacco use, presenting hazards to health    Sciatica    Sinus infection 2010   Spinal stenosis    lumbar    Stroke ([place])    Unspecified sleep apnea    UTI (urinary tract infection)    e coli 06/2020   Past Surgical History:  Procedure  Laterality Date   adenosine cardiolite  1/08   low risk    APPENDECTOMY     BREAST BIOPSY     02/28/11 negative   BREAST LUMPECTOMY     right x1 ('89) left x2 ('70s, '90)   CARDIAC CATHETERIZATION  2001   normal per pt   COLONOSCOPY  11/02   diverticulosis   COLONOSCOPY  9/06   diverticulosis, hemorhoids   COLONOSCOPY N/A 10/01/2016   Procedure: COLONOSCOPY;  Surgeon: Gatha Mayer, MD;  Location: [facility];  Service: Endoscopy;  Laterality: N/A;   dexa  3/02   normal   EYE SURGERY  2012   cataract   IR CT HEAD LTD  09/01/2018   IR PERCUTANEOUS ART THROMBECTOMY/INFUSION INTRACRANIAL INC DIAG ANGIO  09/01/2018   JOINT REPLACEMENT     knee b/l 2011   knee replacement Right 9/11   R. Dr. Telford Nab   RADIOLOGY WITH ANESTHESIA N/A 09/01/2018   Procedure: IR WITH ANESTHESIA;  Surgeon: Luanne Bras, MD;  Location: [facility];  Service: Radiology;  Laterality: N/A;   sleep study  5/05   TONSILLECTOMY     TOTAL ABDOMINAL HYSTERECTOMY     fibroids,  age of 30   TOTAL KNEE ARTHROPLASTY Left 2012   US TRANSVAGINAL PELVIC MODIFIED  2000, 2001   Family History  Problem Relation Age of Onset   Stroke Father    Hypertension Mother        (a lot of animosity in their relationship)   Heart failure Mother    Gout Other        whole family    Prostate cancer Brother    Cancer Brother        prostate    Hepatitis Brother        Hep C; alcoholism (terminal)   Drug abuse Brother        died 76   Breast cancer Sister    Cancer Sister        breast cancer   Other Brother        drug addiction   Social History   Socioeconomic History   Marital status: Widowed    Spouse name: Not on file   Number of children: 1   Years of education: Not on file   Highest education level: Not on file  Occupational History   Occupation: Retired school principal    Employer: RETIRED  Tobacco Use   Smoking status: Former    Packs/day: 0.50    Years: 20.00    Pack years: 10.00    Types:  Cigarettes   Smokeless tobacco: Never   Tobacco comments:    quit approx. 20 years ago  Vaping Use   Vaping Use: Never used  Substance and Sexual Activity   Alcohol use: Yes    Alcohol/week: 0.0 standard drinks    Comment: wine occasional   Drug use: No   Sexual activity: Never  Other Topics Concern   Not on file  Social History Narrative   Widowed x 30+ years as of 12/2018    1 daughter. Retired principal. 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   amLODipine (NORVASC) 5 MG tablet Take 1 tablet (5 mg total) by mouth daily. Take additional 5 mg in pm if BP>130/>80   Ascorbic Acid (VITAMIN C PO) Take by mouth.   atorvastatin (LIPITOR) 20 MG tablet TAKE 1 TABLET (20 MG TOTAL) BY MOUTH DAILY. AT NIGHT. STOP/DISCONTINUE LOVASTATIN 20   Cholecalciferol (VITAMIN D3 PO) Take by mouth.   diclofenac Sodium (VOLTAREN) 1 % GEL Apply 2 g topically 4 (four) times daily as needed.   digoxin (LANOXIN) 0.125 MG tablet TAKE 0.5 TABLETS (0.0625 MG TOTAL) BY MOUTH DAILY.   DULoxetine (CYMBALTA) 30 MG capsule TAKE 1 CAPSULE BY MOUTH EVERY DAY   ELIQUIS 5 MG TABS tablet TAKE 1 TABLET BY MOUTH TWICE A DAY   gabapentin (NEURONTIN) 300 MG capsule Take 2 capsules (600 mg total) by mouth at bedtime.   HYDROcodone-acetaminophen (NORCO/VICODIN) 5-325 MG tablet Take 1 tablet by mouth 2 (two) times daily as needed  for moderate pain.   hydrocortisone 2.5 % cream Apply topically 2 (two)  times daily.   Magnesium 250 MG TABS Take 250 mg by mouth daily.   metoprolol succinate (TOPROL-XL) 100 MG 24 hr tablet TAKE 1 TABLET BY MOUTH DAILY WITH OR IMMEDIATLY FOLLOWING A MEAL   nystatin cream (MYCOSTATIN) Apply 1 application topically 3 (three) times daily. Prn right back   potassium chloride (MICRO-K) 10 MEQ CR capsule Take 2 capsules (20 mEq total) by mouth 2 (two) times daily.   Vibegron (GEMTESA) 75 MG TABS Take 1 tablet by mouth daily.   [DISCONTINUED] furosemide (LASIX) 20 MG tablet Take 1 tablet (20 mg total) by mouth daily as needed. Stop spironolactone due to AKI reduce lasix 20 mg   [DISCONTINUED] potassium chloride (KLOR-CON) 10 MEQ tablet Take 1 tablet (10 mEq total) by mouth daily.   Allergies  Allergen Reactions   Buspirone Hcl Other (See Comments)    REACTION: made her sleepy, ? swollen legs   Lisinopril Other (See Comments)    REACTION: dizziness   Prozac [Fluoxetine Hcl] Other (See Comments)    Sleepy as of 06/26/20 states did not make sleepy   Spironolactone     With lasix 40 mg and 12.5 spironolactone caused AKI   Recent Results (from the past 2160 hour(s))  Urine Culture     Status: None   Collection Time: 02/17/21  2:34 PM   Specimen: Urine  Result Value Ref Range   MICRO NUMBER: [id number]    SPECIMEN QUALITY: Adequate    Sample Source NOT GIVEN    STATUS: FINAL    ISOLATE 1:      Mixed genital flora isolated. These superficial bacteria are not indicative of a urinary tract infection. No further organism identification is warranted on this specimen. If clinically indicated, recollect clean-catch, mid-stream urine and transfer  immediately to Urine Culture Transport Tube.   POCT Urinalysis Dipstick     Status: Abnormal   Collection Time: 02/17/21  2:42 PM  Result Value Ref Range   Color, UA drk yellow    Clarity, UA clear    Glucose, UA Negative Negative   Bilirubin, UA small    Ketones, UA trace    Spec Grav, UA 1.015 1.010 - 1.025   Blood, UA  negative    pH, UA 6.5 5.0 - 8.0   Protein, UA Negative Negative   Urobilinogen, UA 2.0 (A) 0.2 or 1.0 E.U./dL   Nitrite, UA negative    Leukocytes, UA Negative Negative   Appearance clear    Odor none   COVID-19, Flu A+B and RSV     Status: None   Collection Time: 02/24/21  2:24 PM   Specimen: Nasal Swab   Nasal Swab  Previously  Result Value Ref Range   SARS-CoV-2, NAA Not Detected Not Detected   Influenza A, NAA Not Detected Not Detected   Influenza B, NAA Not Detected Not Detected   RSV, NAA Not Detected Not Detected   Test Information: Comment     Comment: This nucleic acid amplification test was developed and its performance characteristics determined by [employer]. Nucleic acid amplification tests include RT-PCR and TMA. This test has not been FDA cleared or approved. This test has been authorized by FDA under an Emergency Use Authorization (EUA). This test is only authorized for the duration of time the declaration that circumstances exist justifying the authorization of the emergency use of in vitro diagnostic tests for detection of SARS-CoV-2 virus and/or diagnosis  of COVID-19 infection under section 564(b)(1) of the Act, 21 U.S.C. 211HER-7(E) (1), unless the authorization is terminated or revoked sooner. When diagnostic testing is negative, the possibility of a false negative result should be considered in the context of a patient's recent exposures and the presence of clinical signs and symptoms consistent with COVID-19. An individual without symptoms of COVID-19 and who is not shedding SARS-CoV-2 virus wo uld expect to have a negative (not detected) result in this assay.   POCT Influenza A/B     Status: None   Collection Time: 02/24/21  4:37 PM  Result Value Ref Range   Influenza A, POC Negative Negative   Influenza B, POC Negative Negative  POC COVID-19     Status: None   Collection Time: 02/24/21  4:38 PM  Result Value Ref Range   SARS Coronavirus  2 Ag Negative Negative  POCT rapid strep A     Status: Abnormal   Collection Time: 02/24/21  4:38 PM  Result Value Ref Range   Rapid Strep A Screen Positive (A) Negative  Comp Met (CMET)     Status: Abnormal   Collection Time: 03/01/21  2:01 PM  Result Value Ref Range   Sodium 140 135 - 145 mEq/L   Potassium 3.7 3.5 - 5.1 mEq/L   Chloride 105 96 - 112 mEq/L   CO2 28 19 - 32 mEq/L   Glucose, Bld 107 (H) 70 - 99 mg/dL   BUN 15 6 - 23 mg/dL   Creatinine, Ser 0.98 0.40 - 1.20 mg/dL   Total Bilirubin 0.5 0.2 - 1.2 mg/dL   Alkaline Phosphatase 106 39 - 117 U/L   AST 24 0 - 37 U/L   ALT 16 0 - 35 U/L   Total Protein 7.2 6.0 - 8.3 g/dL   Albumin 4.0 3.5 - 5.2 g/dL   GFR 53.38 (L) >60.00 mL/min    Comment: Calculated using the CKD-EPI Creatinine Equation (2021)   Calcium 9.7 8.4 - 10.5 mg/dL   Objective  Body mass index is 38.71 kg/m. Wt Readings from Last 3 Encounters:  04/21/21 247 lb 3.2 oz (112.1 kg)  02/26/21 244 lb 2.9 oz (110.8 kg)  02/24/21 246 lb (111.6 kg)   Temp Readings from Last 3 Encounters:  04/21/21 97.7 F (36.5 C) (Oral)  02/17/21 98.1 F (36.7 C) (Oral)  10/01/20 (!) 97.1 F (36.2 C) (Temporal)   BP Readings from Last 3 Encounters:  04/21/21 122/70  02/26/21 100/68  02/17/21 115/80   Pulse Readings from Last 3 Encounters:  04/21/21 87  02/26/21 90  02/17/21 93    Physical Exam Vitals and nursing note reviewed.  Constitutional:      Appearance: Normal appearance. She is well-developed and well-groomed.  HENT:     Head: Normocephalic and atraumatic.  Eyes:     Conjunctiva/sclera: Conjunctivae normal.     Pupils: Pupils are equal, round, and reactive to light.  Cardiovascular:     Rate and Rhythm: Normal rate and regular rhythm.     Heart sounds: Normal heart sounds. No murmur heard. Pulmonary:     Effort: Pulmonary effort is normal.     Breath sounds: Normal breath sounds.  Abdominal:     General: Abdomen is flat. Bowel sounds are normal.      Tenderness: There is no abdominal tenderness.  Musculoskeletal:        General: No tenderness.  Skin:    General: Skin is warm and dry.  Neurological:  General: No focal deficit present.     Mental Status: She is alert and oriented to person, place, and time. Mental status is at baseline.     Cranial Nerves: Cranial nerves 2-12 are intact.     Motor: Motor function is intact.     Coordination: Coordination is intact.     Gait: Gait is intact.  Psychiatric:        Attention and Perception: Attention and perception normal.        Mood and Affect: Mood and affect normal.        Speech: Speech normal.        Behavior: Behavior normal. Behavior is cooperative.        Thought Content: Thought content normal.        Cognition and Memory: Cognition and memory normal.        Judgment: Judgment normal.    Assessment  Plan  Leg edema - Plan: furosemide (LASIX) 40 MG tablet, with K 20 meq  Wear compressions let me know Monday how doing  Declines vascular referral  Bilateral shoulder pain, unspecified chronicity Hold ortho referral   Upper respiratory tract infection, unspecified type Improved   Overactive bladder - Plan: Vibegron (GEMTESA) 75 MG TABS  Pending urology referral   HM See  last visit   Provider: Dr. Olivia Mackie [facility]

## 2021-04-26 ENCOUNTER — Telehealth: Payer: Self-pay | Admitting: Internal Medicine

## 2021-04-26 NOTE — Telephone Encounter (Signed)
How is pts leg swelling?  ? ?

## 2021-04-26 NOTE — Telephone Encounter (Signed)
Patient called to let Dr Olivia Mackie know that she still has swelling and she thinks it is from her bad eating and drinking sweet tea. She said to give her a couple of weeks to straighten her diet out and then she will let her know. ?

## 2021-04-26 NOTE — Telephone Encounter (Signed)
For your information  

## 2021-05-06 DIAGNOSIS — N3942 Incontinence without sensory awareness: Secondary | ICD-10-CM | POA: Diagnosis not present

## 2021-05-06 DIAGNOSIS — N3944 Nocturnal enuresis: Secondary | ICD-10-CM | POA: Diagnosis not present

## 2021-05-06 DIAGNOSIS — R350 Frequency of micturition: Secondary | ICD-10-CM | POA: Diagnosis not present

## 2021-05-06 DIAGNOSIS — R3914 Feeling of incomplete bladder emptying: Secondary | ICD-10-CM | POA: Diagnosis not present

## 2021-05-07 ENCOUNTER — Encounter: Payer: Self-pay | Admitting: Internal Medicine

## 2021-05-07 ENCOUNTER — Other Ambulatory Visit: Payer: Self-pay | Admitting: Internal Medicine

## 2021-05-07 NOTE — Telephone Encounter (Signed)
For your information  

## 2021-05-11 ENCOUNTER — Ambulatory Visit: Payer: Medicare HMO | Admitting: Adult Health

## 2021-05-11 NOTE — Progress Notes (Deleted)
?Monica [name] ?[address] ?[place]. [place] [address] ?[phone] ? ?     STROKE FOLLOW UP NOTE ? ?Monica [name] ?Date of Birth:  1935/05/03 ?Medical Record Number:  [id number]  ? ?Reason for Referral: Left MCA stroke 08/2018 ? ? ? ?CHIEF COMPLAINT:  ?No chief complaint on file. ? ? ? ?HPI:  ? ?Update 05/11/2021 JM: 86 year old female with history of left MCA stroke in 08/2018.  She returns for 28-monthfollow-up. Overall stable from stroke standpoint without new stroke/TIA symptoms.  Compliant on Eliquis and atorvastatin, denies side effects.  Blood pressure today ***.  Closely followed by PCP and cardiology. ? ? ? ? ? ?History provided for reference purposes only ?Update 11/10/2020 JM: Ms. RGarmanyreturns for 673-monthollow-up regarding left MCA stroke in 08/2018.  Overall doing well.  No neurological concerns today.  Denies new stroke/TIA symptoms.  Compliant on Eliquis and atorvastatin without side effects.  Blood pressure today 154/90.  Routinely followed by cardiology with 3 day Zio patch monitor (05/2020) showed 100% A. fib burden - as remains asymptomatic, cards recommends continued monitoring and no intervention required at this time.  Continues to follow with PCP routinely currently managing chronic low back pain with lumbar radiculopathy. She does complain of right arm pain which is not new. Also c/o short-term memory loss at times as well as worsening depression.  She does admit to being under increased stress over the past 2 to 3 months as her sister moved in with her and now responsible for caring for her.  No further concerns at this time. ? ? ?Update 05/05/2020 JM: Ms. RiPorchereturns for stroke follow-up ? ?Stable from stroke standpoint without new or reoccurring stroke/TIA symptoms ?Compliant on Eliquis and atorvastatin -denies side effects ?Blood pressure today 127/75 ? ?Chronic cervical and lumbar pain currently stable - currently on gabapentin taking '600mg'$  nightly and  duloxetin '30mg'$  daily managed by PCP ? ?No further concerns at this time ? ?Update 12/30/2019 JM: Ms. RiVotaweturns for stroke follow up accompianed by her daughter.  She has been stable from a stroke standpoint without new or recurring stroke/TIA symptoms.  Remains on Eliquis and atorvastatin for secondary stroke prevention without side effects.  Blood pressure today 138/88 routinely monitors at home which has been stable. Multiple other non stroke complaints including intermittent bilateral hand numbness and heaviness sensation on the bottom of her feet bilaterally at night.  Reports symptoms present over the past few months.  Hand numbness can be in both right or left hand generalized without specific affected fingers or areas and can last for 5 to 10 minutes.  Symptoms usually present upon awakening but also at times while sitting in a chair or increased activity.  Denies any associated weakness.  Does report ongoing bilateral neck and shoulder pain with increased tightness of upper trapezius especially when looking to the right or the left.  Denies numbness in the bottom of her feet but more of a heaviness/tightness sensation and has been using lotion/balm for very dry, flaky skin.  She continues to experience lower back pain which worsens after standing for too long.  This was previously discussed and had evaluation by neurosurgery who recommended surgical procedure but she was not interested in surgical intervention.  This pain previously managed well on gabapentin 300 mg 3 times daily but currently on 300 mg twice daily.  No further concerns at this time. ? ?Update 06/26/2019 JM: Ms. RiGrueleturns for stroke follow-up accompianed by her daughter.  She has been stable since prior visit without residual deficits and denies new or recurrent stroke/TIA symptoms.  Continues on Eliquis with history of atrial fibrillation and secondary stroke prevention without side effects. Blood pressure today 152/82.  No  neurological concerns at this time. ? ?Update 12/26/2018: Monica [name] is an 86 year old female who is being seen today for stroke follow-up.  She has been stable from a stroke standpoint without new or reoccurring stroke/TIA symptoms.  Continues on Eliquis without bleeding or bruising.  Ongoing follow-up with cardiology regularly.  At prior visit, patient's main concern was low back pain with radiculopathy limiting functional ability.  Initiated gabapentin at prior visit and recommended follow-up with established orthopedic provider.  Continue to experience pain despite gabapentin.  Due to ongoing pain, she requested referral to neurosurgery for further evaluation and potential treatment options.  She states her only option was back surgery which she is not interested at this time due to recent stroke and anticoagulation.  She continues to take gabapentin and does endorse improvement and benefit with ongoing use.  Denies side effects.  Due to decreased pain with gabapentin, she has slowly increased activity and daily functioning.  Continues to use a cane for ambulation and denies any recent falls.  Continues to live independently but daughter will check on her frequently along with other family members.  She endorses improvement of her overall mood.  Cardiology started her on Xanax 0.25 mg daily as needed for anxiety but only had to take 1 time and has not had any recurrent anxiety symptoms.  Recently initiated care with new PCP Dr. Terese Door.  She is questioning potential return to driving.  No further concerns at this time. ? ?Initial visit 10/25/2018: Monica [name] is being seen today for hospital follow-up and is accompanied by her daughter.  She has recovered well from a stroke standpoint without residual deficits.  She has since completed home health therapies.  She continues on Eliquis without bleeding or bruising for atrial flutter and secondary stroke prevention along with treatment of bilateral DVT.   She continues on amiodarone taper and plans on doing 30-day cardiac event monitor after 11/2 (amioderone will be completed at that time).  She was initially staying with her daughter at hospital discharge but has since returned home.  She has been having difficulty with bilateral lower back pain with radiculopathy R>L leg pain.  Pain consists of stabbing sensation which is worsened with ambulation and progressively worsening.  She previously was being seen by Ortho with injections.  She had trialed gabapentin in the past which she tolerated well but would only use intermittently.  Prior MRI lumbar spine and 08/2017 showed multiple levels of spinal stenosis and degenerative changes.  Initially upon hospital discharge, she continued to have right-sided weakness therefore relying heavily on left side for ambulation.  She has not contacted her establish orthopedic provider, Dr. Tamala Julian, in regards to her worsening back pain.  She has also been experiencing increased anxiety and depression since returning home which she believes is more due to her constant pain which has been limiting mobility and functioning.  No further concerns at this time.  Denies new or worsening stroke/TIA symptoms. ? ?Stroke admission 09/01/2018: Ms. Arbutus Nelligan is a 86 y.o. female with history of hypertension, hyperlipidemia, obesity, remote GI bleeding who presented to [facility] ED on 09/01/2018 with sudden onset aphasia and right hemiparesis.  Neurology consulted with stroke work-up revealing left MCA punctate small infarct due to left M1 occlusion status post  TPA and IR with TICI 3 reperfusion embolic pattern in setting of bilateral DVT and new onset A. Fib.  She received TPA without complication.  MRI showed left MCA infarcts.  CTA head/neck showed occlusion of distal M1 with intermediate collateralization, bilateral ICA bulb arthrosclerosis and right VA origin stenosis.  2D echo normal EF.  Lower extremity venous Doppler showed right PTV, left  popliteal and bilateral peroneal vein acute DVT.  Eliquis initiated for treatment of DVT and new onset atrial fibrillation.  No evidence of HLD with LDL 66 or DM with A1c 6.2.  HTN stable.  Due to atrial fibrillat

## 2021-05-13 ENCOUNTER — Encounter: Payer: Self-pay | Admitting: Adult Health

## 2021-05-13 ENCOUNTER — Ambulatory Visit (INDEPENDENT_AMBULATORY_CARE_PROVIDER_SITE_OTHER): Payer: Medicare Other | Admitting: Adult Health

## 2021-05-13 VITALS — BP 133/71 | HR 68 | Ht 67.0 in | Wt 247.0 lb

## 2021-05-13 DIAGNOSIS — F332 Major depressive disorder, recurrent severe without psychotic features: Secondary | ICD-10-CM | POA: Diagnosis not present

## 2021-05-13 DIAGNOSIS — I63412 Cerebral infarction due to embolism of left middle cerebral artery: Secondary | ICD-10-CM | POA: Diagnosis not present

## 2021-05-13 DIAGNOSIS — R4789 Other speech disturbances: Secondary | ICD-10-CM | POA: Diagnosis not present

## 2021-05-13 DIAGNOSIS — R4189 Other symptoms and signs involving cognitive functions and awareness: Secondary | ICD-10-CM | POA: Diagnosis not present

## 2021-05-13 NOTE — Progress Notes (Signed)
?[facility] ?[address] ?[place] [address] ?[phone] ? ?     STROKE FOLLOW UP NOTE ? ?[name] ?Date of Birth:  [birth year] ?Medical Record Number:  [id number]  ? ?Reason for Referral: Left MCA stroke 08/2018 ? ? ? ?CHIEF COMPLAINT:  ?Chief Complaint  ?Patient presents with  ? Follow-up  ?  RM 2 with daughter Monica [name]  ?Pt is well, states she is having trouble with cognition and confusion. No other stroke concerns   ? ? ? ?HPI:  ? ?Update 05/11/2021 JM: 86 year old female with history of left MCA stroke in 08/2018.  She returns for 48-monthfollow-up. Greatest concern today is in regards to decline in memory and word finding difficulty. This has been gradually worsening over the past few months but more so since her sister passed away in JFeb 01, 2024 She does admit to depression but just during short visit, evidence of significant depression and anxiety. She had frequent inappropriate outbursts towards her daughter and at one point got upset with me because I agreed with her daughter about her having anxiety. She is tearful throughout the entirety of the visit.  She is not suicidal.  Daughter has been trying to encourage her to use free counseling sessions provided by hospice from when her sister passed away but has been very resistant mainly due to the stigma around mental health services. She eventually agreed to further consider pursuing therapy.  She is currently taking duloxetine and gabapentin but prescribed for nerve pain.  She is prescribed Xanax but does not take routinely. ? ?Otherwise, stable from stroke standpoint. Compliant on Eliquis and atorvastatin, denies side effects.  Blood pressure today 133/71.  Closely followed by PCP and cardiology.  No further neurological concerns. ? ? ? ? ?History provided for reference purposes only ?Update 11/10/2020 JM: Ms. RCovenreturns for 637-monthollow-up regarding left MCA stroke in 08/2018.  Overall doing well.  No neurological  concerns today.  Denies new stroke/TIA symptoms.  Compliant on Eliquis and atorvastatin without side effects.  Blood pressure today 154/90.  Routinely followed by cardiology with 3 day Zio patch monitor (05/2020) showed 100% A. fib burden - as remains asymptomatic, cards recommends continued monitoring and no intervention required at this time.  Continues to follow with PCP routinely currently managing chronic low back pain with lumbar radiculopathy. She does complain of right arm pain which is not new. Also c/o short-term memory loss at times as well as worsening depression.  She does admit to being under increased stress over the past 2 to 3 months as her sister moved in with her and now responsible for caring for her.  No further concerns at this time. ? ? ?Update 05/05/2020 JM: [name] for stroke follow-up ? ?Stable from stroke standpoint without new or reoccurring stroke/TIA symptoms ?Compliant on Eliquis and atorvastatin -denies side effects ?Blood pressure today 127/75 ? ?Chronic cervical and lumbar pain currently stable - currently on gabapentin taking '600mg'$  nightly and duloxetin '30mg'$  daily managed by PCP ? ?No further concerns at this time ? ?Update 12/30/2019 JM: [name] for stroke follow up accompianed by her daughter.  She has been stable from a stroke standpoint without new or recurring stroke/TIA symptoms.  Remains on Eliquis and atorvastatin for secondary stroke prevention without side effects.  Blood pressure today 138/88 routinely monitors at home which has been stable. Multiple other non stroke complaints including intermittent bilateral hand numbness and heaviness sensation on the bottom of her feet  bilaterally at night.  Reports symptoms present over the past few months.  Hand numbness can be in both right or left hand generalized without specific affected fingers or areas and can last for 5 to 10 minutes.  Symptoms usually present upon awakening but also at times while  sitting in a chair or increased activity.  Denies any associated weakness.  Does report ongoing bilateral neck and shoulder pain with increased tightness of upper trapezius especially when looking to the right or the left.  Denies numbness in the bottom of her feet but more of a heaviness/tightness sensation and has been using lotion/balm for very dry, flaky skin.  She continues to experience lower back pain which worsens after standing for too long.  This was previously discussed and had evaluation by neurosurgery who recommended surgical procedure but she was not interested in surgical intervention.  This pain previously managed well on gabapentin 300 mg 3 times daily but currently on 300 mg twice daily.  No further concerns at this time. ? ?Update 06/26/2019 JM: [name] returns for stroke follow-up accompianed by her daughter.  She has been stable since prior visit without residual deficits and denies new or recurrent stroke/TIA symptoms.  Continues on Eliquis with history of atrial fibrillation and secondary stroke prevention without side effects. Blood pressure today 152/82.  No neurological concerns at this time. ? ?Update 12/26/2018: [name] is an 86 year old female who is being seen today for stroke follow-up.  She has been stable from a stroke standpoint without new or reoccurring stroke/TIA symptoms.  Continues on Eliquis without bleeding or bruising.  Ongoing follow-up with cardiology regularly.  At prior visit, patient's main concern was low back pain with radiculopathy limiting functional ability.  Initiated gabapentin at prior visit and recommended follow-up with established orthopedic provider.  Continue to experience pain despite gabapentin.  Due to ongoing pain, she requested referral to neurosurgery for further evaluation and potential treatment options.  She states her only option was back surgery which she is not interested at this time due to recent stroke and anticoagulation.  She  continues to take gabapentin and does endorse improvement and benefit with ongoing use.  Denies side effects.  Due to decreased pain with gabapentin, she has slowly increased activity and daily functioning.  Continues to use a cane for ambulation and denies any recent falls.  Continues to live independently but daughter will check on her frequently along with other family members.  She endorses improvement of her overall mood.  Cardiology started her on Xanax 0.25 mg daily as needed for anxiety but only had to take 1 time and has not had any recurrent anxiety symptoms.  Recently initiated care with new PCP Dr. Terese Door.  She is questioning potential return to driving.  No further concerns at this time. ? ?Initial visit 10/25/2018: Ms. Strohman is being seen today for hospital follow-up and is accompanied by her daughter.  She has recovered well from a stroke standpoint without residual deficits.  She has since completed home health therapies.  She continues on Eliquis without bleeding or bruising for atrial flutter and secondary stroke prevention along with treatment of bilateral DVT.  She continues on amiodarone taper and plans on doing 30-day cardiac event monitor after 11/2 (amioderone will be completed at that time).  She was initially staying with her daughter at hospital discharge but has since returned home.  She has been having difficulty with bilateral lower back pain with radiculopathy R>L leg pain.  Pain consists  of stabbing sensation which is worsened with ambulation and progressively worsening.  She previously was being seen by Ortho with injections.  She had trialed gabapentin in the past which she tolerated well but would only use intermittently.  Prior MRI lumbar spine and 08/2017 showed multiple levels of spinal stenosis and degenerative changes.  Initially upon hospital discharge, she continued to have right-sided weakness therefore relying heavily on left side for ambulation.  She has not  contacted her establish orthopedic provider, Dr. Tamala Julian, in regards to her worsening back pain.  She has also been experiencing increased anxiety and depression since returning home which she believes is more due to

## 2021-05-13 NOTE — Patient Instructions (Addendum)
You will  be called to complete an MR brain to rule out any new strokes  ? ?For memory concerns, ensure you are routinely doing memory exercises such as crossword puzzles, word search and sudoku as well as importance of managing mental health, ensuring good sleep, routine exercise and healthy diet ? ?Highly recommend doing counseling for depression/anxiety ? ?Continue Eliquis (apixaban) daily  and atorvastatin for secondary stroke prevention ? ?Continue to follow with cardiology for atrial fibrillation and Eliquis management ? ?Continue to follow up with PCP regarding blood pressure and cholesterol management  ?Maintain strict control of hypertension with blood pressure goal below 130/90 and cholesterol with LDL cholesterol (bad cholesterol) goal below 70 mg/dL.  ? ?Signs of a Stroke? Follow the BEFAST method:  ?Balance Watch for a sudden loss of balance, trouble with coordination or vertigo ?Eyes Is there a sudden loss of vision in one or both eyes? Or double vision?  ?Face: Ask the person to smile. Does one side of the face droop or is it numb?  ?Arms: Ask the person to raise both arms. Does one arm drift downward? Is there weakness or numbness of a leg? ?Speech: Ask the person to repeat a simple phrase. Does the speech sound slurred/strange? Is the person confused ? ?Time: If you observe any of these signs, call 911. ? ? ? ? ? ? ?Thank you for coming to see [place] at [facility]. I hope we have been able to provide you high quality care today. ? ?You may receive a patient satisfaction survey over the next few weeks. We would appreciate your feedback and comments so that we may continue to improve ourselves and the health of our patients. ? ? ? ? ? ?Major Depressive Disorder, Adult ?Major depressive disorder is a mental health condition. This disorder affects feelings. It can also affect the body. Symptoms of this condition last most of the day, almost every day, for 2 weeks. This disorder can  affect: ?Relationships. ?Daily activities, such as work and school. ?Activities that you normally like to do. ?What are the causes? ?The cause of this condition is not known. The disorder is likely caused by a mix of things, including: ?Your personality, such as being a shy person. ?Your behavior, or how you act toward others. ?Your thoughts and feelings. ?Too much alcohol or drugs. ?How you react to stress. ?Health and mental problems that you have had for a long time. ?Things that hurt you in the past (trauma). ?Big changes in your life, such as divorce. ?What increases the risk? ?The following factors may make you more likely to develop this condition: ?Having family members with depression. ?Being a woman. ?Problems in the family. ?Low levels of some brain chemicals. ?Things that caused you pain as a child, especially if you lost a parent or were abused. ?A lot of stress in your life, such as from: ?Living without basic needs of life, such as food and shelter. ?Being treated poorly because of race, sex, or religion (discrimination). ?Health and mental problems that you have had for a long time. ?What are the signs or symptoms? ?The main symptoms of this condition are: ?Being sad all the time. ?Being grouchy all the time. ?Loss of interest in things and activities. ?Other symptoms include: ?Sleeping too much or too little. ?Eating too much or too little. ?Gaining or losing weight, without knowing why. ?Feeling tired or having low energy. ?Being restless and weak. ?Feeling hopeless, worthless, or guilty. ?Trouble thinking clearly or  making decisions. ?Thoughts of hurting yourself or others, or thoughts of ending your life. ?Spending a lot of time alone. ?Inability to complete common tasks of daily life. ?If you have very bad MDD, you may: ?Believe things that are not true. ?Hear, see, taste, or feel things that are not there. ?Have mild depression that lasts for at least 2 years. ?Feel very sad and hopeless. ?Have  trouble speaking or moving. ?How is this treated? ?This condition may be treated with: ?Talk therapy. This teaches you to know bad thoughts, feelings, and actions and how to change them. ?This can also help you to communicate with others. ?This can be done with members of your family. ?Medicines. These can be used to treat worry (anxiety), depression, or low levels of chemicals in the brain. ?Lifestyle changes. You may need to: ?Limit alcohol use. ?Limit drug use. ?Get regular exercise. ?Get plenty of sleep. ?Make healthy eating choices. ?Spend more time outdoors. ?Brain stimulation. This treatment excites the brain. This is done when symptoms are very bad or have not gotten better with other treatments. ?Follow these instructions at home: ?Activity ?Get regular exercise as told. ?Spend time outdoors as told. ?Make time to do the things you enjoy. ?Find ways to deal with stress. Try to: ?Meditate. ?Do deep breathing. ?Spend time in nature. ?Keep a journal. ?Return to your normal activities as told by your doctor. Ask your doctor what activities are safe for you. ?Alcohol and drug use ?If you drink alcohol: ?Limit how much you use to: ?0-1 drink a day for women. ?0-2 drinks a day for men. ?Be aware of how much alcohol is in your drink. In the [place] one drink equals one 12 oz bottle of beer (355 mL), one 5 oz glass of wine (148 mL), or one 1? oz glass of hard liquor (44 mL). ?Talk to your doctor about: ?Alcohol use. Alcohol can affect some medicines. ?Any drug use. ?General instructions ? ?Take over-the-counter and prescription medicines and herbal preparations only as told by your doctor. ?Eat a healthy diet. ?Get a lot of sleep. ?Think about joining a support group. Your doctor may be able to suggest one. ?Keep all follow-up visits as told by your doctor. This is important. ?Where to find more information: ?National Alliance on Mental Illness: www.nami.org ?[employer] of Mental Health:  https://carter.com/ ?American Psychiatric Association: www.psychiatry.org/patients-families/ ?Contact a doctor if: ?Your symptoms get worse. ?You get new symptoms. ?Get help right away if: ?You hurt yourself. ?You have serious thoughts about hurting yourself or others. ?You see, hear, taste, smell, or feel things that are not there. ?If you ever feel like you may hurt yourself or others, or have thoughts about taking your own life, get help right away. Go to your nearest emergency department or: ?Call your local emergency services (911 in the [place] ?Call a suicide crisis helpline, such as the [place] at 4063174428 or 988 in the [place]. This is open 24 hours a day in the [place] ?Text the Crisis Text Line at 386-074-0455 (in the [place].). ?Summary ?Major depressive disorder is a mental health condition. This disorder affects feelings. Symptoms of this condition last most of the day, almost every day, for 2 weeks. ?The symptoms of this disorder can cause problems with relationships and with daily activities. ?There are treatments and support for people who get this disorder. You may need more than one type of treatment. ?Get help right away if you have serious thoughts about hurting yourself or  others. ?This information is not intended to replace advice given to you by your health care provider. Make sure you discuss any questions you have with your health care provider. ?Document Revised: 09/02/2020 Document Reviewed: 01/19/2019 ?Elsevier Patient Education ? 2022 [place]. ? ? ? ?

## 2021-05-17 ENCOUNTER — Telehealth: Payer: Self-pay | Admitting: Adult Health

## 2021-05-17 NOTE — Telephone Encounter (Signed)
Medicare/BCBS Auth: [id number] (exp. 05/17/21 to 06/15/21) order sent to GI, they will reach out to the patient to schedule.  ?

## 2021-05-28 ENCOUNTER — Inpatient Hospital Stay: Admission: RE | Admit: 2021-05-28 | Payer: Medicare Other | Source: Ambulatory Visit

## 2021-05-31 ENCOUNTER — Encounter: Payer: Self-pay | Admitting: Interventional Cardiology

## 2021-06-04 ENCOUNTER — Ambulatory Visit
Admission: RE | Admit: 2021-06-04 | Discharge: 2021-06-04 | Disposition: A | Discharge status: Home / Self Care | Payer: Medicare Other | Source: Ambulatory Visit | Attending: Adult Health | Admitting: Adult Health

## 2021-06-04 DIAGNOSIS — I6782 Cerebral ischemia: Secondary | ICD-10-CM | POA: Diagnosis not present

## 2021-06-04 DIAGNOSIS — G319 Degenerative disease of nervous system, unspecified: Secondary | ICD-10-CM | POA: Diagnosis not present

## 2021-06-04 DIAGNOSIS — I613 Nontraumatic intracerebral hemorrhage in brain stem: Secondary | ICD-10-CM | POA: Diagnosis not present

## 2021-06-04 DIAGNOSIS — R413 Other amnesia: Secondary | ICD-10-CM | POA: Diagnosis not present

## 2021-06-07 ENCOUNTER — Telehealth: Payer: Self-pay

## 2021-06-07 ENCOUNTER — Encounter: Payer: Self-pay | Admitting: Internal Medicine

## 2021-06-07 NOTE — Telephone Encounter (Signed)
-----   Message from Frann Rider, NP sent at 06/07/2021  8:11 AM EDT ----- ?Please call for results - please let patient know that recent MRI did not show any evidence of new stroke or any new concerning findings.  No changes to current treatment regimen. ?

## 2021-06-07 NOTE — Telephone Encounter (Signed)
I called pt. No answer, left a message asking pt to call me back.   

## 2021-06-08 ENCOUNTER — Telehealth: Payer: Self-pay | Admitting: Internal Medicine

## 2021-06-08 ENCOUNTER — Other Ambulatory Visit: Payer: Self-pay | Admitting: Interventional Cardiology

## 2021-06-08 DIAGNOSIS — H0102A Squamous blepharitis right eye, upper and lower eyelids: Secondary | ICD-10-CM | POA: Diagnosis not present

## 2021-06-08 DIAGNOSIS — Z961 Presence of intraocular lens: Secondary | ICD-10-CM | POA: Diagnosis not present

## 2021-06-08 DIAGNOSIS — H5213 Myopia, bilateral: Secondary | ICD-10-CM | POA: Diagnosis not present

## 2021-06-08 DIAGNOSIS — H0102B Squamous blepharitis left eye, upper and lower eyelids: Secondary | ICD-10-CM | POA: Diagnosis not present

## 2021-06-08 DIAGNOSIS — H04123 Dry eye syndrome of bilateral lacrimal glands: Secondary | ICD-10-CM | POA: Diagnosis not present

## 2021-06-08 DIAGNOSIS — H26492 Other secondary cataract, left eye: Secondary | ICD-10-CM | POA: Diagnosis not present

## 2021-06-08 NOTE — Telephone Encounter (Signed)
Eliquis '5mg'$  refill request received. Patient is 86 years old, weight-112kg, Crea-0.98 on 03/01/2021, Diagnosis-CVA & Aflutter, and last seen by Dr. Tamala Julian on 02/26/2021. Dose is appropriate based on dosing criteria. Will send in refill to requested pharmacy.   ?

## 2021-06-08 NOTE — Telephone Encounter (Signed)
Contacted pt, no answer. Will send mychart message  ?

## 2021-06-08 NOTE — Telephone Encounter (Signed)
Copied from [place] [phone]. Topic: Medicare AWV ?>> Jun 08, 2021 10:40 AM Harris-Coley, Hannah Beat wrote: ?Reason for CRM: Left message for patient to schedule Annual Wellness Visit.  Please schedule with Nurse Health Advisor Denisa O'Brien-Blaney, LPN at [facility].  Please call [phone] ask for Juliann Pulse ?

## 2021-06-14 ENCOUNTER — Other Ambulatory Visit: Payer: Self-pay | Admitting: Internal Medicine

## 2021-06-14 DIAGNOSIS — E876 Hypokalemia: Secondary | ICD-10-CM

## 2021-06-14 MED ORDER — POTASSIUM CHLORIDE CRYS ER 20 MEQ PO TBCR: EXTENDED_RELEASE_TABLET | ORAL | 3 refills | Status: DC

## 2021-06-16 ENCOUNTER — Ambulatory Visit
Admission: RE | Admit: 2021-06-16 | Discharge: 2021-06-16 | Disposition: A | Discharge status: Home / Self Care | Payer: Medicare Other | Source: Ambulatory Visit | Attending: Internal Medicine | Admitting: Internal Medicine

## 2021-06-16 DIAGNOSIS — Z1231 Encounter for screening mammogram for malignant neoplasm of breast: Secondary | ICD-10-CM | POA: Diagnosis not present

## 2021-07-26 ENCOUNTER — Telehealth: Payer: Self-pay | Admitting: Internal Medicine

## 2021-07-26 NOTE — Telephone Encounter (Signed)
Copied from [place] [phone]. Topic: Medicare AWV >> Jul 26, 2021 10:38 AM Harris-Coley, Hannah Beat wrote: Reason for CRM: Attempted to schedule AWV. Unable to LVM.  Will try at later time.

## 2021-08-14 ENCOUNTER — Other Ambulatory Visit: Payer: Self-pay | Admitting: Internal Medicine

## 2021-08-14 DIAGNOSIS — E785 Hyperlipidemia, unspecified: Secondary | ICD-10-CM

## 2021-08-17 ENCOUNTER — Other Ambulatory Visit: Payer: Self-pay | Admitting: Interventional Cardiology

## 2021-08-17 DIAGNOSIS — E876 Hypokalemia: Secondary | ICD-10-CM

## 2021-09-03 ENCOUNTER — Ambulatory Visit (INDEPENDENT_AMBULATORY_CARE_PROVIDER_SITE_OTHER): Payer: Medicare Other | Admitting: Internal Medicine

## 2021-09-03 ENCOUNTER — Ambulatory Visit: Payer: Medicare Other | Admitting: Internal Medicine

## 2021-09-03 ENCOUNTER — Encounter: Payer: Self-pay | Admitting: Internal Medicine

## 2021-09-03 ENCOUNTER — Telehealth: Payer: Self-pay

## 2021-09-03 VITALS — BP 140/80 | HR 77 | Temp 98.1°F | Resp 14 | Ht 67.0 in | Wt 242.2 lb

## 2021-09-03 DIAGNOSIS — F339 Major depressive disorder, recurrent, unspecified: Secondary | ICD-10-CM

## 2021-09-03 DIAGNOSIS — M25562 Pain in left knee: Secondary | ICD-10-CM | POA: Diagnosis not present

## 2021-09-03 DIAGNOSIS — M545 Low back pain, unspecified: Secondary | ICD-10-CM

## 2021-09-03 DIAGNOSIS — G8929 Other chronic pain: Secondary | ICD-10-CM

## 2021-09-03 DIAGNOSIS — R269 Unspecified abnormalities of gait and mobility: Secondary | ICD-10-CM

## 2021-09-03 DIAGNOSIS — L304 Erythema intertrigo: Secondary | ICD-10-CM

## 2021-09-03 DIAGNOSIS — M25511 Pain in right shoulder: Secondary | ICD-10-CM

## 2021-09-03 DIAGNOSIS — M48061 Spinal stenosis, lumbar region without neurogenic claudication: Secondary | ICD-10-CM | POA: Diagnosis not present

## 2021-09-03 DIAGNOSIS — F419 Anxiety disorder, unspecified: Secondary | ICD-10-CM

## 2021-09-03 DIAGNOSIS — E559 Vitamin D deficiency, unspecified: Secondary | ICD-10-CM

## 2021-09-03 DIAGNOSIS — R600 Localized edema: Secondary | ICD-10-CM | POA: Diagnosis not present

## 2021-09-03 DIAGNOSIS — M5416 Radiculopathy, lumbar region: Secondary | ICD-10-CM | POA: Diagnosis not present

## 2021-09-03 DIAGNOSIS — Z1231 Encounter for screening mammogram for malignant neoplasm of breast: Secondary | ICD-10-CM

## 2021-09-03 DIAGNOSIS — I10 Essential (primary) hypertension: Secondary | ICD-10-CM

## 2021-09-03 DIAGNOSIS — R7303 Prediabetes: Secondary | ICD-10-CM

## 2021-09-03 LAB — TSH: TSH: 2.29 u[IU]/mL (ref 0.35–5.50)

## 2021-09-03 LAB — CBC WITH DIFFERENTIAL/PLATELET
Basophils Absolute: 0.1 10*3/uL (ref 0.0–0.1)
Basophils Relative: 0.8 % (ref 0.0–3.0)
Eosinophils Absolute: 0.2 10*3/uL (ref 0.0–0.7)
Eosinophils Relative: 2.6 % (ref 0.0–5.0)
HCT: 40.2 % (ref 36.0–46.0)
Hemoglobin: 12.6 g/dL (ref 12.0–15.0)
Lymphocytes Relative: 24.5 % (ref 12.0–46.0)
Lymphs Abs: 1.6 10*3/uL (ref 0.7–4.0)
MCHC: 31.5 g/dL (ref 30.0–36.0)
MCV: 88.5 fl (ref 78.0–100.0)
Monocytes Absolute: 0.6 10*3/uL (ref 0.1–1.0)
Monocytes Relative: 8.4 % (ref 3.0–12.0)
Neutro Abs: 4.2 10*3/uL (ref 1.4–7.7)
Neutrophils Relative %: 63.7 % (ref 43.0–77.0)
Platelets: 220 10*3/uL (ref 150.0–400.0)
RBC: 4.54 Mil/uL (ref 3.87–5.11)
RDW: 16.3 % — ABNORMAL HIGH (ref 11.5–15.5)
WBC: 6.6 10*3/uL (ref 4.0–10.5)

## 2021-09-03 LAB — COMPREHENSIVE METABOLIC PANEL
ALT: 17 U/L (ref 0–35)
AST: 20 U/L (ref 0–37)
Albumin: 3.9 g/dL (ref 3.5–5.2)
Alkaline Phosphatase: 105 U/L (ref 39–117)
BUN: 16 mg/dL (ref 6–23)
CO2: 27 mEq/L (ref 19–32)
Calcium: 9.6 mg/dL (ref 8.4–10.5)
Chloride: 105 mEq/L (ref 96–112)
Creatinine, Ser: 0.99 mg/dL (ref 0.40–1.20)
GFR: 52.54 mL/min — ABNORMAL LOW (ref >60.00)
Glucose, Bld: 126 mg/dL — ABNORMAL HIGH (ref 70–99)
Potassium: 4.1 mEq/L (ref 3.5–5.1)
Sodium: 141 mEq/L (ref 135–145)
Total Bilirubin: 0.6 mg/dL (ref 0.2–1.2)
Total Protein: 6.8 g/dL (ref 6.0–8.3)

## 2021-09-03 LAB — LIPID PANEL
Cholesterol: 115 mg/dL (ref 0–200)
HDL: 37.8 mg/dL — ABNORMAL LOW (ref >39.00)
LDL Cholesterol: 50 mg/dL (ref 0–99)
NonHDL: 77.05
Total CHOL/HDL Ratio: 3
Triglycerides: 137 mg/dL (ref 0.0–149.0)
VLDL: 27.4 mg/dL (ref 0.0–40.0)

## 2021-09-03 LAB — HEMOGLOBIN A1C: Hgb A1c MFr Bld: 6.5 % (ref 4.6–6.5)

## 2021-09-03 LAB — VITAMIN D 25 HYDROXY (VIT D DEFICIENCY, FRACTURES): VITD: 30.11 ng/mL (ref 30.00–100.00)

## 2021-09-03 MED ORDER — GABAPENTIN 300 MG PO CAPS: 600.0000 mg | ORAL_CAPSULE | Freq: Every day | ORAL | 5 refills | Status: DC

## 2021-09-03 MED ORDER — HYDROCODONE-ACETAMINOPHEN 5-325 MG PO TABS: 1.0000 | ORAL_TABLET | Freq: Two times a day (BID) | ORAL | 0 refills | Status: DC | PRN

## 2021-09-03 MED ORDER — AMLODIPINE BESYLATE 5 MG PO TABS: 5.0000 mg | ORAL_TABLET | Freq: Every day | ORAL | 3 refills | Status: DC

## 2021-09-03 MED ORDER — ALPRAZOLAM 0.25 MG PO TABS: 0.2500 mg | ORAL_TABLET | Freq: Two times a day (BID) | ORAL | 5 refills | Status: DC | PRN

## 2021-09-03 NOTE — Patient Instructions (Addendum)
Referred to [facility] PT  Dr. Olivia Mackie McLean-Scocuzza [phone]  I am leaving 12/03/21 Dr. Volanda Napoleon is coming this month and will take over my patients   Call back when you are ready to see vascular about leg swelling for evaluation   Peripheral Edema  Peripheral edema is swelling that is caused by a buildup of fluid. Peripheral edema most often affects the lower legs, ankles, and feet. It can also develop in the arms, hands, and face. The area of the body that has peripheral edema will look swollen. It may also feel heavy or warm. Your clothes may start to feel tight. Pressing on the area may make a temporary dent in your skin (pitting edema). You may not be able to move your swollen arm or leg as much as usual. There are many causes of peripheral edema. It can happen because of a complication of other conditions such as heart failure, kidney disease, or a problem with your circulation. It also can be a side effect of certain medicines or happen because of an infection. It often happens to women during pregnancy. Sometimes, the cause is not known. Follow these instructions at home: Managing pain, stiffness, and swelling  Raise (elevate) your legs while you are sitting or lying down. Move around often to prevent stiffness and to reduce swelling. Do not sit or stand for long periods of time. Do not wear tight clothing. Do not wear garters on your upper legs. Exercise your legs to get your circulation going. This helps to move the fluid back into your blood vessels, and it may help the swelling go down. Wear compression stockings as told by your health care provider. These stockings help to prevent blood clots and reduce swelling in your legs. It is important that these are the correct size. These stockings should be prescribed by your doctor to prevent possible injuries. If elastic bandages or wraps are recommended, use them as told by your health care provider. Medicines Take over-the-counter and  prescription medicines only as told by your health care provider. Your health care provider may prescribe medicine to help your body get rid of excess water (diuretic). Take this medicine if you are told to take it. General instructions Eat a low-salt (low-sodium) diet as told by your health care provider. Sometimes, eating less salt may reduce swelling. Pay attention to any changes in your symptoms. Moisturize your skin daily to help prevent skin from cracking and draining. Keep all follow-up visits. This is important. Contact a health care provider if: You have a fever. You have swelling in only one leg. You have increased swelling, redness, or pain in one or both of your legs. You have drainage or sores at the area where you have edema. Get help right away if: You have edema that starts suddenly or is getting worse, especially if you are pregnant or have a medical condition. You develop shortness of breath, especially when you are lying down. You have pain in your chest or abdomen. You feel weak. You feel like you will faint. These symptoms may be an emergency. Get help right away. Call 911. Do not wait to see if the symptoms will go away. Do not drive yourself to the hospital. Summary Peripheral edema is swelling that is caused by a buildup of fluid. Peripheral edema most often affects the lower legs, ankles, and feet. Move around often to prevent stiffness and to reduce swelling. Do not sit or stand for long periods of time. Pay attention to  any changes in your symptoms. Contact a health care provider if you have edema that starts suddenly or is getting worse, especially if you are pregnant or have a medical condition. Get help right away if you develop shortness of breath, especially when lying down. This information is not intended to replace advice given to you by your health care provider. Make sure you discuss any questions you have with your health care provider. Document  Revised: 10/12/2020 Document Reviewed: 10/12/2020 Elsevier Patient Education  [place].

## 2021-09-03 NOTE — Telephone Encounter (Signed)
Lvm for pt to return call to see if she can come in '@11'$ :30 instead of 2:40 today 09/03/21 due to Dr.Tracy needing to catch a flight & needs to leave around 2:30 if pt cannot come in around that time she will have to r/s

## 2021-09-03 NOTE — Progress Notes (Signed)
Chief Complaint  Patient presents with   Follow-up    Disc urology dx, leg edema which comes & goes, wt gain   F/u  1. Leg edema comes and goes she is eating potatoe chips rec reduce/stop but also could be chronic lymphedema and rec consider vascular f/u for lymphopress  2. Chronic low back pain due to spinal stenosis Left knee pain and right shoulder pain with abnormal gait will refer for PT on pain contract refilled narcotics norco 5-325 mg bid prn   3. Overactive bladder disc consider [facility] in [place] in the future  Meds for this interfere with underlying eye problems so she cant take these meds  4. Htn controlled for age  Norvasc 5 mg qd, lasix 40 mg qd, toprol xl 100 mg qd with K was on spironolactone in the past but this caused working CKD so stopped    Review of Systems  Constitutional:  Negative for weight loss.  HENT:  Negative for hearing loss.   Eyes:  Negative for blurred vision.  Respiratory:  Negative for shortness of breath.   Cardiovascular:  Positive for leg swelling. Negative for chest pain.  Gastrointestinal:  Negative for abdominal pain and blood in stool.  Genitourinary:  Positive for frequency. Negative for dysuria.  Musculoskeletal:  Negative for falls and joint pain.  Skin:  Negative for rash.  Neurological:  Negative for headaches.  Psychiatric/Behavioral:  Negative for depression.    Past Medical History:  Diagnosis Date   Allergic rhinitis    Anxiety    Benign neoplasm of breast 2013   Cardiac arrhythmia    Cardiomegaly    Cataract    CHF (congestive heart failure) (HCC)    Compulsive eating patterns    Compulsive overeating    Depression    Diastolic heart failure (HCC)    LVH with free wall thickness 1.4 cm EF 60% Dr. Tamala Julian    Diffuse cystic mastopathy 2013   Edema    Epistaxis    noted 12/20/18    GI bleeding    in ~2018    Gout    Hiatal hernia    HTN (hypertension)    Hyperlipidemia    Lump or mass in breast 2012   OA  (osteoarthritis) of knee    with injections   Obesity    OSA (obstructive sleep apnea)    not on cpap   Other abnormal glucose    Peptic stricture of esophagus    Personal history of tobacco use, presenting hazards to health    Sciatica    Sinus infection 2010   Spinal stenosis    lumbar    Stroke ([place])    Unspecified sleep apnea    UTI (urinary tract infection)    e coli 06/2020   Past Surgical History:  Procedure Laterality Date   adenosine cardiolite  1/08   low risk    APPENDECTOMY     BREAST BIOPSY     02/28/11 negative   BREAST LUMPECTOMY     right x1 ('89) left x2 ('70s, '90)   CARDIAC CATHETERIZATION  2001   normal per pt   COLONOSCOPY  11/02   diverticulosis   COLONOSCOPY  9/06   diverticulosis, hemorhoids   COLONOSCOPY N/A 10/01/2016   Procedure: COLONOSCOPY;  Surgeon: Gatha Mayer, MD;  Location: [facility];  Service: Endoscopy;  Laterality: N/A;   dexa  3/02   normal   EYE SURGERY  2012   cataract   IR  CT HEAD LTD  09/01/2018   IR PERCUTANEOUS ART THROMBECTOMY/INFUSION INTRACRANIAL INC DIAG ANGIO  09/01/2018   JOINT REPLACEMENT     knee b/l 2011   knee replacement Right 9/11   R. Dr. Telford Nab   RADIOLOGY WITH ANESTHESIA N/A 09/01/2018   Procedure: IR WITH ANESTHESIA;  Surgeon: Luanne Bras, MD;  Location: [place];  Service: Radiology;  Laterality: N/A;   sleep study  5/05   TONSILLECTOMY     TOTAL ABDOMINAL HYSTERECTOMY     fibroids, age of 54   TOTAL KNEE ARTHROPLASTY Left 2012   US TRANSVAGINAL PELVIC MODIFIED  2000, 2001   Family History  Problem Relation Age of Onset   Stroke Father    Hypertension Mother        (a lot of animosity in their relationship)   Heart failure Mother    Gout Other        whole family    Prostate cancer Brother    Cancer Brother        prostate    Hepatitis Brother        Hep C; alcoholism (terminal)   Drug abuse Brother        died 34   Breast cancer Sister    Cancer Sister        breast cancer   Other  Brother        drug addiction   Social History   Socioeconomic History   Marital status: Widowed    Spouse name: Not on file   Number of children: 1   Years of education: Not on file   Highest education level: Not on file  Occupational History   Occupation: Retired school principal    Employer: RETIRED  Tobacco Use   Smoking status: Former    Packs/day: 0.50    Years: 20.00    Total pack years: 10.00    Types: Cigarettes   Smokeless tobacco: Never   Tobacco comments:    quit approx. 20 years ago  Vaping Use   Vaping Use: Never used  Substance and Sexual Activity   Alcohol use: Yes    Alcohol/week: 0.0 standard drinks of alcohol    Comment: wine occasional   Drug use: No   Sexual activity: Never  Other Topics Concern   Not on file  Social History Narrative   Widowed x 30+ years as of 12/2018    1 daughter. Retired principal. 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    Social Determinants of Radio broadcast assistant Strain: [place]  (04/01/2020)   Overall Financial Resource Strain (CARDIA)    Difficulty of Paying Living Expenses: Not hard at all  Food Insecurity: No [place] (04/01/2020)   Hunger Vital Sign    Worried About Running Out of Food in the Last Year: Never true    [place] in the Last Year: Never true  Transportation Needs: No Transportation Needs (04/01/2020)   PRAPARE - Hydrologist (Medical): No    Lack of Transportation (Non-Medical): No  Physical Activity: Not on file  Stress: No Stress Concern Present (04/01/2020)   [place]    Feeling of Stress : Not at all  Social Connections: Unknown (04/01/2020)   Social Connection and Isolation Panel [NHANES]    Frequency of Communication with Friends and Family: More than three times a week  Frequency of Social Gatherings with Friends and Family: Not on file     Attends Religious Services: Not on file    Active Member of Clubs or Organizations: Not on file    Attends Club or Organization Meetings: Not on file    Marital Status: Not on file  Intimate Partner Violence: Not At Risk (04/01/2020)   Humiliation, Afraid, Rape, and Kick questionnaire    Fear of Current or Ex-Partner: No    Emotionally Abused: No    Physically Abused: No    Sexually Abused: No   Current Meds  Medication Sig   allopurinol (ZYLOPRIM) 100 MG tablet TAKE 2 TABLETS BY MOUTH EVERY DAY   apixaban (ELIQUIS) 5 MG TABS tablet TAKE 1 TABLET BY MOUTH TWICE A DAY   Ascorbic Acid (VITAMIN C PO) Take by mouth.   atorvastatin (LIPITOR) 20 MG tablet TAKE 1 TABLET (20 MG TOTAL) BY MOUTH DAILY. AT NIGHT. STOP/DISCONTINUE LOVASTATIN   Cholecalciferol (VITAMIN D3 PO) Take by mouth.   diclofenac Sodium (VOLTAREN) 1 % GEL Apply 2 g topically 4 (four) times daily as needed.   digoxin (LANOXIN) 0.125 MG tablet TAKE 0.5 TABLETS (0.0625 MG TOTAL) BY MOUTH DAILY.   DULoxetine (CYMBALTA) 30 MG capsule TAKE 1 CAPSULE BY MOUTH EVERY DAY   furosemide (LASIX) 40 MG tablet Take 1 tablet (40 mg total) by mouth daily.   hydrocortisone 2.5 % cream Apply topically 2 (two) times daily.   Magnesium 250 MG TABS Take 250 mg by mouth daily.   potassium chloride SA (KLOR-CON M20) 20 MEQ tablet Take 1 tablet (20 mEq total) by mouth daily. TAKE TABLET BY MOUTH AS DIRECTED   [DISCONTINUED] ALPRAZolam (XANAX) 0.25 MG tablet Take 1 tablet (0.25 mg total) by mouth 2 (two) times daily as needed for anxiety.   [DISCONTINUED] amLODipine (NORVASC) 5 MG tablet Take 1 tablet (5 mg total) by mouth daily. Take additional 5 mg in pm if BP>130/>80   [DISCONTINUED] gabapentin (NEURONTIN) 300 MG capsule Take 2 capsules (600 mg total) by mouth at bedtime.   [DISCONTINUED] HYDROcodone-acetaminophen (NORCO/VICODIN) 5-325 MG tablet Take 1 tablet by mouth 2 (two) times daily as needed for moderate pain.   [DISCONTINUED] metoprolol  succinate (TOPROL-XL) 100 MG 24 hr tablet TAKE 1 TABLET BY MOUTH DAILY WITH OR IMMEDIATLY FOLLOWING A MEAL   [DISCONTINUED] nystatin cream (MYCOSTATIN) Apply 1 application topically 3 (three) times daily. Prn right back   Allergies  Allergen Reactions   Buspirone Hcl Other (See Comments)    REACTION: made her sleepy, ? swollen legs   Lisinopril Other (See Comments)    REACTION: dizziness   Prozac [Fluoxetine Hcl] Other (See Comments)    Sleepy as of 06/26/20 states did not make sleepy   Spironolactone     With lasix 40 mg and 12.5 spironolactone caused AKI   Recent Results (from the past 2160 hour(s))  Comprehensive metabolic panel     Status: Abnormal   Collection Time: 09/03/21 12:15 PM  Result Value Ref Range   Sodium 141 135 - 145 mEq/L   Potassium 4.1 3.5 - 5.1 mEq/L   Chloride 105 96 - 112 mEq/L   CO2 27 19 - 32 mEq/L   Glucose, Bld 126 (H) 70 - 99 mg/dL   BUN 16 6 - 23 mg/dL   Creatinine, Ser 0.99 0.40 - 1.20 mg/dL   Total Bilirubin 0.6 0.2 - 1.2 mg/dL   Alkaline Phosphatase 105 39 - 117 U/L   AST 20 0 - 37 U/L  ALT 17 0 - 35 U/L   Total Protein 6.8 6.0 - 8.3 g/dL   Albumin 3.9 3.5 - 5.2 g/dL   GFR 52.54 (L) >60.00 mL/min    Comment: Calculated using the CKD-EPI Creatinine Equation (2021)   Calcium 9.6 8.4 - 10.5 mg/dL  Lipid panel     Status: Abnormal   Collection Time: 09/03/21 12:15 PM  Result Value Ref Range   Cholesterol 115 0 - 200 mg/dL    Comment: ATP III Classification       Desirable:  < 200 mg/dL               Borderline High:  200 - 239 mg/dL          High:  > = 240 mg/dL   Triglycerides 137.0 0.0 - 149.0 mg/dL    Comment: Normal:  <150 mg/dLBorderline High:  150 - 199 mg/dL   HDL 37.80 (L) >39.00 mg/dL   VLDL 27.4 0.0 - 40.0 mg/dL   LDL Cholesterol 50 0 - 99 mg/dL   Total CHOL/HDL Ratio 3     Comment:                Men          Women1/2 Average Risk     3.4          3.3Average Risk          5.0          4.42X Average Risk          9.6          7.13X  Average Risk          15.0          11.0                       NonHDL 77.05     Comment: NOTE:  Non-HDL goal should be 30 mg/dL higher than patient's LDL goal (i.e. LDL goal of < 70 mg/dL, would have non-HDL goal of < 100 mg/dL)  CBC with Differential/Platelet     Status: Abnormal   Collection Time: 09/03/21 12:15 PM  Result Value Ref Range   WBC 6.6 4.0 - 10.5 K/uL   RBC 4.54 3.87 - 5.11 Mil/uL   Hemoglobin 12.6 12.0 - 15.0 g/dL   HCT 40.2 36.0 - 46.0 %   MCV 88.5 78.0 - 100.0 fl   MCHC 31.5 30.0 - 36.0 g/dL   RDW 16.3 (H) 11.5 - 15.5 %   Platelets 220.0 150.0 - 400.0 K/uL   Neutrophils Relative % 63.7 43.0 - 77.0 %   Lymphocytes Relative 24.5 12.0 - 46.0 %   Monocytes Relative 8.4 3.0 - 12.0 %   Eosinophils Relative 2.6 0.0 - 5.0 %   Basophils Relative 0.8 0.0 - 3.0 %   Neutro Abs 4.2 1.4 - 7.7 K/uL   Lymphs Abs 1.6 0.7 - 4.0 K/uL   Monocytes Absolute 0.6 0.1 - 1.0 K/uL   Eosinophils Absolute 0.2 0.0 - 0.7 K/uL   Basophils Absolute 0.1 0.0 - 0.1 K/uL  Hemoglobin A1c     Status: None   Collection Time: 09/03/21 12:15 PM  Result Value Ref Range   Hgb A1c MFr Bld 6.5 4.6 - 6.5 %    Comment: Glycemic Control Guidelines for People with Diabetes:Non Diabetic:  <6%Goal of Therapy: <7%Additional Action Suggested:  >8%   TSH     Status: None   Collection Time: 09/03/21  12:15 PM  Result Value Ref Range   TSH 2.29 0.35 - 5.50 uIU/mL  Vitamin D (25 hydroxy)     Status: None   Collection Time: 09/03/21 12:15 PM  Result Value Ref Range   VITD 30.11 30.00 - 100.00 ng/mL   Objective  Body mass index is 37.93 kg/m. Wt Readings from Last 3 Encounters:  09/03/21 242 lb 3.2 oz (109.9 kg)  05/13/21 247 lb (112 kg)  04/21/21 247 lb 3.2 oz (112.1 kg)   Temp Readings from Last 3 Encounters:  09/03/21 98.1 F (36.7 C) (Oral)  04/21/21 97.7 F (36.5 C) (Oral)  02/17/21 98.1 F (36.7 C) (Oral)   BP Readings from Last 3 Encounters:  09/03/21 140/80  05/13/21 133/71  04/21/21 122/70    Pulse Readings from Last 3 Encounters:  09/03/21 77  05/13/21 68  04/21/21 87    Physical Exam Vitals and nursing note reviewed.  Constitutional:      Appearance: Normal appearance. She is well-developed and well-groomed.  HENT:     Head: Normocephalic and atraumatic.  Eyes:     Conjunctiva/sclera: Conjunctivae normal.     Pupils: Pupils are equal, round, and reactive to light.  Cardiovascular:     Rate and Rhythm: Normal rate and regular rhythm.     Heart sounds: Normal heart sounds. No murmur heard. Pulmonary:     Effort: Pulmonary effort is normal.     Breath sounds: Normal breath sounds.  Abdominal:     General: Abdomen is flat. Bowel sounds are normal.     Tenderness: There is no abdominal tenderness.  Musculoskeletal:        General: No tenderness.  Skin:    General: Skin is warm and dry.  Neurological:     General: No focal deficit present.     Mental Status: She is alert and oriented to person, place, and time. Mental status is at baseline.     Cranial Nerves: Cranial nerves 2-12 are intact.     Motor: Motor function is intact.     Coordination: Coordination is intact.     Gait: Gait abnormal.  Psychiatric:        Attention and Perception: Attention and perception normal.        Mood and Affect: Mood and affect normal.        Speech: Speech normal.        Behavior: Behavior normal. Behavior is cooperative.        Thought Content: Thought content normal.        Cognition and Memory: Cognition and memory normal.        Judgment: Judgment normal.     Assessment  Plan  Acute pain of left knee - Plan: Ambulatory referral to Physical Therapy [facility] Depression, recurrent ([place])  Acute pain of right shoulder - Plan: Ambulatory referral to Physical Therapy, CANCELED: Ambulatory referral to Physical Therapy  Chronic low back pain, unspecified back pain laterality, unspecified whether sciatica present - Plan: HYDROcodone-acetaminophen (NORCO/VICODIN) 5-325 MG  tablet  Spinal stenosis of lumbar region without neurogenic claudication  Abnormal gait - Plan: Ambulatory referral to Physical Therapy  Lumbar radiculopathy - Plan: gabapentin (NEURONTIN) 300 MG capsule  Leg edema - Plan: TSH Consider vascular in the future for lymph pumps   Vitamin D deficiency - Plan: Vitamin D (25 hydroxy)  Prediabetes - Plan: Hemoglobin A1c Lifestyle changes  Anxiety - Plan: ALPRAZolam (XANAX) 0.25 MG tablet, cymbalta 30 mg qd   Hypertension, unspecified type - Plan:  amLODipine (NORVASC) 5 MG tablet Norvasc 5 mg qd, lasix 40 mg qd, toprol xl 100 mg qd with K was on spironolactone in the past but this caused working CKD so stopped    Intertrigo - Plan: nystatin cream (MYCOSTATIN)   HM Flu shot utd prevnar utd pna 23 due had 12/03/13 Consider prevnar 20 in the future Tdap per prior PCP had 12/19/16 consider shingrix  in future  covid vx 3/3 consider 4th covid dose    Out of age window pap s/p hysterectomy ? What is left in I.e ovaries possibly b/l ovaries out    Mammogram 06/16/21 negative ordered [place] ordered 2024    Colonoscopy 10/08/16 negative Dr. Carlean Purl    dexa 03/05/15 normal   Former smoker age 44-40 max 1ppd    CT ab pelvis 03/19/19 IMPRESSION: 1. No acute findings or explanation for the patient's symptoms. No evidence of malignancy. 2. Enlarging bilateral renal cysts. 3. 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 Recommend followup by ultrasound in 3 years. This recommendation follows ACR consensus guidelines: White Paper of the ACR Incidental Findings Committee II on Vascular Findings. J Am Coll Radiol 2013; 10:789-794. Aortic Atherosclerosis (ICD10-I70.0). 4. Additional incidental findings including lower lumbar spondylosis, small umbilical hernia containing only fat and probable pelvic floor laxity.  Adrenals/Urinary Tract: Stable mild prominence of both adrenal glands without focal mass lesion. Enlarging  bilateral renal cysts. No evidence of enhancing renal mass, urinary tract calculus or hydronephrosis. No focal bladder abnormality.     Prior PCPs Dr. Lacinda Axon and Dr. Delfina Redwood in [facility] Cards Dr. Tamala Julian est ob/gyn      Provider: Dr. Olivia Mackie [facility]

## 2021-09-03 NOTE — Telephone Encounter (Signed)
PA has been started on 09/03/21 via covermymeds for pt's HYDROcodone-Acetaminophen 5-'325MG'$  tablets  Key:[id number]  PA Case [id number]  Rx #:[id number]  Awaiting denial or approval

## 2021-09-06 ENCOUNTER — Other Ambulatory Visit: Payer: Self-pay | Admitting: Interventional Cardiology

## 2021-09-06 DIAGNOSIS — E876 Hypokalemia: Secondary | ICD-10-CM

## 2021-09-22 DIAGNOSIS — R269 Unspecified abnormalities of gait and mobility: Secondary | ICD-10-CM

## 2021-09-22 DIAGNOSIS — R7303 Prediabetes: Secondary | ICD-10-CM | POA: Insufficient documentation

## 2021-09-22 HISTORY — DX: Unspecified abnormalities of gait and mobility: R26.9

## 2021-09-22 MED ORDER — NYSTATIN 100000 UNIT/GM EX CREA: 1.0000 | TOPICAL_CREAM | Freq: Three times a day (TID) | CUTANEOUS | 11 refills | Status: DC

## 2021-09-22 MED ORDER — METOPROLOL SUCCINATE ER 100 MG PO TB24: ORAL_TABLET | ORAL | 3 refills | Status: DC

## 2021-09-23 ENCOUNTER — Telehealth: Payer: Self-pay | Admitting: Internal Medicine

## 2021-09-23 NOTE — Telephone Encounter (Signed)
Copied from [place] [id number]. Topic: Medicare AWV >> Sep 23, 2021  1:58 PM Devoria Glassing wrote: Reason for CRM: Left message for patient to schedule Annual Wellness Visit.  Please schedule with Nurse Health Advisor Denisa O'Brien-Blaney, LPN at [facility]. This appt can be telephone or office visit.  Please call [phone] [facility] for [facility]

## 2021-10-06 ENCOUNTER — Other Ambulatory Visit: Payer: Self-pay | Admitting: Interventional Cardiology

## 2021-10-06 DIAGNOSIS — E876 Hypokalemia: Secondary | ICD-10-CM

## 2021-11-02 DIAGNOSIS — M25562 Pain in left knee: Secondary | ICD-10-CM | POA: Diagnosis not present

## 2021-11-02 DIAGNOSIS — R2681 Unsteadiness on feet: Secondary | ICD-10-CM | POA: Diagnosis not present

## 2021-11-02 DIAGNOSIS — M25511 Pain in right shoulder: Secondary | ICD-10-CM | POA: Diagnosis not present

## 2021-11-04 ENCOUNTER — Ambulatory Visit: Payer: Medicare Other

## 2021-11-05 ENCOUNTER — Ambulatory Visit: Payer: Medicare Other

## 2021-11-05 DIAGNOSIS — M25511 Pain in right shoulder: Secondary | ICD-10-CM | POA: Diagnosis not present

## 2021-11-05 DIAGNOSIS — R2681 Unsteadiness on feet: Secondary | ICD-10-CM | POA: Diagnosis not present

## 2021-11-05 DIAGNOSIS — M25562 Pain in left knee: Secondary | ICD-10-CM | POA: Diagnosis not present

## 2021-11-09 DIAGNOSIS — M25511 Pain in right shoulder: Secondary | ICD-10-CM | POA: Diagnosis not present

## 2021-11-09 DIAGNOSIS — M25562 Pain in left knee: Secondary | ICD-10-CM | POA: Diagnosis not present

## 2021-11-09 DIAGNOSIS — R2681 Unsteadiness on feet: Secondary | ICD-10-CM | POA: Diagnosis not present

## 2021-11-11 DIAGNOSIS — M25562 Pain in left knee: Secondary | ICD-10-CM | POA: Diagnosis not present

## 2021-11-11 DIAGNOSIS — M25511 Pain in right shoulder: Secondary | ICD-10-CM | POA: Diagnosis not present

## 2021-11-11 DIAGNOSIS — R2681 Unsteadiness on feet: Secondary | ICD-10-CM | POA: Diagnosis not present

## 2021-11-19 DIAGNOSIS — R2681 Unsteadiness on feet: Secondary | ICD-10-CM | POA: Diagnosis not present

## 2021-11-19 DIAGNOSIS — M25562 Pain in left knee: Secondary | ICD-10-CM | POA: Diagnosis not present

## 2021-11-19 DIAGNOSIS — M25511 Pain in right shoulder: Secondary | ICD-10-CM | POA: Diagnosis not present

## 2021-11-22 ENCOUNTER — Telehealth: Payer: Self-pay

## 2021-11-22 ENCOUNTER — Ambulatory Visit: Payer: Medicare Other

## 2021-11-22 NOTE — Telephone Encounter (Signed)
Patient reports need to cancel awv today. Nurse cancels wellness visit per patient request and offers reschedule. Patient declines reschedule today and remarks she will call the office back to reschedule for awv.

## 2021-11-23 DIAGNOSIS — M25511 Pain in right shoulder: Secondary | ICD-10-CM | POA: Diagnosis not present

## 2021-11-23 DIAGNOSIS — R2681 Unsteadiness on feet: Secondary | ICD-10-CM | POA: Diagnosis not present

## 2021-11-23 DIAGNOSIS — M25562 Pain in left knee: Secondary | ICD-10-CM | POA: Diagnosis not present

## 2021-11-26 DIAGNOSIS — M25511 Pain in right shoulder: Secondary | ICD-10-CM | POA: Diagnosis not present

## 2021-11-26 DIAGNOSIS — M25562 Pain in left knee: Secondary | ICD-10-CM | POA: Diagnosis not present

## 2021-11-26 DIAGNOSIS — R2681 Unsteadiness on feet: Secondary | ICD-10-CM | POA: Diagnosis not present

## 2021-12-03 ENCOUNTER — Other Ambulatory Visit: Payer: Self-pay | Admitting: Interventional Cardiology

## 2021-12-03 DIAGNOSIS — E876 Hypokalemia: Secondary | ICD-10-CM

## 2021-12-06 DIAGNOSIS — M25562 Pain in left knee: Secondary | ICD-10-CM | POA: Diagnosis not present

## 2021-12-06 DIAGNOSIS — M25511 Pain in right shoulder: Secondary | ICD-10-CM | POA: Diagnosis not present

## 2021-12-06 DIAGNOSIS — R2681 Unsteadiness on feet: Secondary | ICD-10-CM | POA: Diagnosis not present

## 2021-12-10 DIAGNOSIS — R2681 Unsteadiness on feet: Secondary | ICD-10-CM | POA: Diagnosis not present

## 2021-12-10 DIAGNOSIS — M25562 Pain in left knee: Secondary | ICD-10-CM | POA: Diagnosis not present

## 2021-12-10 DIAGNOSIS — M25511 Pain in right shoulder: Secondary | ICD-10-CM | POA: Diagnosis not present

## 2021-12-14 DIAGNOSIS — M25511 Pain in right shoulder: Secondary | ICD-10-CM | POA: Diagnosis not present

## 2021-12-14 DIAGNOSIS — M25562 Pain in left knee: Secondary | ICD-10-CM | POA: Diagnosis not present

## 2021-12-14 DIAGNOSIS — R2681 Unsteadiness on feet: Secondary | ICD-10-CM | POA: Diagnosis not present

## 2021-12-17 DIAGNOSIS — M25562 Pain in left knee: Secondary | ICD-10-CM | POA: Diagnosis not present

## 2021-12-17 DIAGNOSIS — M25511 Pain in right shoulder: Secondary | ICD-10-CM | POA: Diagnosis not present

## 2021-12-17 DIAGNOSIS — R2681 Unsteadiness on feet: Secondary | ICD-10-CM | POA: Diagnosis not present

## 2021-12-20 DIAGNOSIS — R2681 Unsteadiness on feet: Secondary | ICD-10-CM | POA: Diagnosis not present

## 2021-12-20 DIAGNOSIS — M25562 Pain in left knee: Secondary | ICD-10-CM | POA: Diagnosis not present

## 2021-12-20 DIAGNOSIS — M25511 Pain in right shoulder: Secondary | ICD-10-CM | POA: Diagnosis not present

## 2021-12-22 DIAGNOSIS — M25511 Pain in right shoulder: Secondary | ICD-10-CM | POA: Diagnosis not present

## 2021-12-22 DIAGNOSIS — M25562 Pain in left knee: Secondary | ICD-10-CM | POA: Diagnosis not present

## 2021-12-22 DIAGNOSIS — R2681 Unsteadiness on feet: Secondary | ICD-10-CM | POA: Diagnosis not present

## 2021-12-24 ENCOUNTER — Telehealth: Payer: Self-pay | Admitting: Internal Medicine

## 2021-12-24 NOTE — Telephone Encounter (Signed)
Spoke with patient to schedule AWV we noted that she need to change her pcp since Dr Olivia Mackie is not at practice. She stated she will call office

## 2021-12-27 DIAGNOSIS — M25511 Pain in right shoulder: Secondary | ICD-10-CM | POA: Diagnosis not present

## 2021-12-27 DIAGNOSIS — M25562 Pain in left knee: Secondary | ICD-10-CM | POA: Diagnosis not present

## 2021-12-27 DIAGNOSIS — R2681 Unsteadiness on feet: Secondary | ICD-10-CM | POA: Diagnosis not present

## 2021-12-29 ENCOUNTER — Ambulatory Visit (INDEPENDENT_AMBULATORY_CARE_PROVIDER_SITE_OTHER): Payer: Medicare Other | Admitting: Family

## 2021-12-29 ENCOUNTER — Ambulatory Visit (INDEPENDENT_AMBULATORY_CARE_PROVIDER_SITE_OTHER): Payer: Medicare Other

## 2021-12-29 ENCOUNTER — Telehealth: Payer: Self-pay | Admitting: Family

## 2021-12-29 ENCOUNTER — Encounter: Payer: Self-pay | Admitting: Family

## 2021-12-29 VITALS — BP 138/82 | HR 73 | Temp 98.0°F | Ht 67.0 in | Wt 235.6 lb

## 2021-12-29 DIAGNOSIS — M19012 Primary osteoarthritis, left shoulder: Secondary | ICD-10-CM | POA: Diagnosis not present

## 2021-12-29 DIAGNOSIS — R7309 Other abnormal glucose: Secondary | ICD-10-CM

## 2021-12-29 DIAGNOSIS — M89319 Hypertrophy of bone, unspecified shoulder: Secondary | ICD-10-CM | POA: Insufficient documentation

## 2021-12-29 DIAGNOSIS — R634 Abnormal weight loss: Secondary | ICD-10-CM | POA: Diagnosis not present

## 2021-12-29 DIAGNOSIS — Z23 Encounter for immunization: Secondary | ICD-10-CM

## 2021-12-29 DIAGNOSIS — I63412 Cerebral infarction due to embolism of left middle cerebral artery: Secondary | ICD-10-CM

## 2021-12-29 DIAGNOSIS — M89312 Hypertrophy of bone, left shoulder: Secondary | ICD-10-CM

## 2021-12-29 DIAGNOSIS — R1310 Dysphagia, unspecified: Secondary | ICD-10-CM

## 2021-12-29 HISTORY — DX: Hypertrophy of bone, unspecified shoulder: M89.319

## 2021-12-29 LAB — COMPREHENSIVE METABOLIC PANEL
ALT: 14 U/L (ref 0–35)
AST: 17 U/L (ref 0–37)
Albumin: 4.1 g/dL (ref 3.5–5.2)
Alkaline Phosphatase: 117 U/L (ref 39–117)
BUN: 15 mg/dL (ref 6–23)
CO2: 30 mEq/L (ref 19–32)
Calcium: 9.4 mg/dL (ref 8.4–10.5)
Chloride: 105 mEq/L (ref 96–112)
Creatinine, Ser: 0.92 mg/dL (ref 0.40–1.20)
GFR: 57.25 mL/min — ABNORMAL LOW (ref >60.00)
Glucose, Bld: 103 mg/dL — ABNORMAL HIGH (ref 70–99)
Potassium: 4 mEq/L (ref 3.5–5.1)
Sodium: 141 mEq/L (ref 135–145)
Total Bilirubin: 0.7 mg/dL (ref 0.2–1.2)
Total Protein: 6.8 g/dL (ref 6.0–8.3)

## 2021-12-29 LAB — CBC WITH DIFFERENTIAL/PLATELET
Basophils Absolute: 0 10*3/uL (ref 0.0–0.1)
Basophils Relative: 0.6 % (ref 0.0–3.0)
Eosinophils Absolute: 0.2 10*3/uL (ref 0.0–0.7)
Eosinophils Relative: 2.9 % (ref 0.0–5.0)
HCT: 41.9 % (ref 36.0–46.0)
Hemoglobin: 13.2 g/dL (ref 12.0–15.0)
Lymphocytes Relative: 26.6 % (ref 12.0–46.0)
Lymphs Abs: 1.6 10*3/uL (ref 0.7–4.0)
MCHC: 31.6 g/dL (ref 30.0–36.0)
MCV: 88.8 fl (ref 78.0–100.0)
Monocytes Absolute: 0.5 10*3/uL (ref 0.1–1.0)
Monocytes Relative: 8.1 % (ref 3.0–12.0)
Neutro Abs: 3.7 10*3/uL (ref 1.4–7.7)
Neutrophils Relative %: 61.8 % (ref 43.0–77.0)
Platelets: 232 10*3/uL (ref 150.0–400.0)
RBC: 4.72 Mil/uL (ref 3.87–5.11)
RDW: 16.2 % — ABNORMAL HIGH (ref 11.5–15.5)
WBC: 5.9 10*3/uL (ref 4.0–10.5)

## 2021-12-29 LAB — POCT GLYCOSYLATED HEMOGLOBIN (HGB A1C): Hemoglobin A1C: 6 % — AB (ref 4.0–5.6)

## 2021-12-29 LAB — TSH: TSH: 2.37 u[IU]/mL (ref 0.35–5.50)

## 2021-12-29 NOTE — Assessment & Plan Note (Addendum)
History of esophageal stricture.  No upper endoscopy in record.  Referral to gastroenterology for evaluation of dysphagia, particularly in the setting of unintentional weight loss.  Pending baseline labs in setting weight loss.

## 2021-12-29 NOTE — Patient Instructions (Addendum)
I have ordered CT of your neck to evaluate for enlarged lymph nodes, thyroid. I have also placed a referral to gastroenterology due to your difficulty in swallowing. Let us know if you dont hear back within a week in regards to an appointment being scheduled.   So that you are aware, if you are [facility] MyChart user , please pay attention to your MyChart messages as you may receive a MyChart message with a phone number to call and schedule this test/appointment own your own from our referral coordinator. This is a new process so I do not want you to miss this message.  If you are not a MyChart user, you will receive a phone call.   Nice to meet you!

## 2021-12-29 NOTE — Progress Notes (Signed)
Subjective:    Patient ID: Monica [name], female    DOB: 08-18-1935, 86 y.o.   MRN: [id number]  CC: ZOEE HEENEY is a 86 y.o. female who presents today for an acute visit.    HPI: Concern for left neck enlarged lymph nodes and prominence of clavicle noticed this week.  She 'keeps feeling it' and concerned it is larger.   Nontender  No fever, cough, choking, vomiting, epigastric burning  She does report when eating 'too fast' , she may feel she has swallowed 'too much' and difficult to swallow.   Crispy chips are hard to swallow; she doesn't feel she chews well.   No h/o thyroid disease.   She endorses overeating with sugar, chips, and bread.  Weight loss is not intentional.      H/o CVA 08/2018.  She is compliant with Eliquis, atorvastatin.  H/o  CKD ( Crt 0.99) DM, atrial flutter ( follow with Dr Tamala Julian),  gerd, HTN, CHF, OSA, esophageal stricture  Follows with neurology, last visit 04/2021  Est care 01/06/22 Dr Volanda Napoleon  Mammogram UTD Colonoscopy performed Dr. Carlean Purl, 09/2016.  EGD not performed at that time .  HISTORY:  Past Medical History:  Diagnosis Date   Allergic rhinitis    Anxiety    Benign neoplasm of breast 2013   Cardiac arrhythmia    Cardiomegaly    Cataract    CHF (congestive heart failure) (HCC)    Compulsive eating patterns    Compulsive overeating    Depression    Diastolic heart failure (HCC)    LVH with free wall thickness 1.4 cm EF 60% Dr. Tamala Julian    Diffuse cystic mastopathy 2013   Edema    Epistaxis    noted 12/20/18    GI bleeding    in ~2018    Gout    Hiatal hernia    HTN (hypertension)    Hyperlipidemia    Lump or mass in breast 2012   OA (osteoarthritis) of knee    with injections   Obesity    OSA (obstructive sleep apnea)    not on cpap   Other abnormal glucose    Peptic stricture of esophagus    Personal history of tobacco use, presenting hazards to health    Sciatica    Sinus infection 2010   Spinal stenosis     lumbar    Stroke ([place])    Unspecified sleep apnea    UTI (urinary tract infection)    e coli 06/2020   Past Surgical History:  Procedure Laterality Date   adenosine cardiolite  1/08   low risk    APPENDECTOMY     BREAST BIOPSY     02/28/11 negative   BREAST LUMPECTOMY     right x1 ('89) left x2 ('70s, '90)   CARDIAC CATHETERIZATION  2001   normal per pt   COLONOSCOPY  11/02   diverticulosis   COLONOSCOPY  9/06   diverticulosis, hemorhoids   COLONOSCOPY N/A 10/01/2016   Procedure: COLONOSCOPY;  Surgeon: Gatha Mayer, MD;  Location: [facility];  Service: Endoscopy;  Laterality: N/A;   dexa  3/02   normal   EYE SURGERY  2012   cataract   IR CT HEAD LTD  09/01/2018   IR PERCUTANEOUS ART THROMBECTOMY/INFUSION INTRACRANIAL INC DIAG ANGIO  09/01/2018   JOINT REPLACEMENT     knee b/l 2011   knee replacement Right 9/11   R. Dr. Telford Nab   RADIOLOGY WITH ANESTHESIA N/A  09/01/2018   Procedure: IR WITH ANESTHESIA;  Surgeon: Luanne Bras, MD;  Location: [place];  Service: Radiology;  Laterality: N/A;   sleep study  5/05   TONSILLECTOMY     TOTAL ABDOMINAL HYSTERECTOMY     fibroids, age of 59   TOTAL KNEE ARTHROPLASTY Left 2012   US TRANSVAGINAL PELVIC MODIFIED  2000, 2001   Family History  Problem Relation Age of Onset   Stroke Father    Hypertension Mother        (a lot of animosity in their relationship)   Heart failure Mother    Gout Other        whole family    Prostate cancer Brother    Cancer Brother        prostate    Hepatitis Brother        Hep C; alcoholism (terminal)   Drug abuse Brother        died 70   Breast cancer Sister    Cancer Sister        breast cancer   Other Brother        drug addiction    Allergies: Patient has no active allergies. Current Outpatient Medications on File Prior to Visit  Medication Sig Dispense Refill   allopurinol (ZYLOPRIM) 100 MG tablet TAKE 2 TABLETS BY MOUTH EVERY DAY 180 tablet 3   ALPRAZolam (XANAX) 0.25 MG tablet  Take 1 tablet (0.25 mg total) by mouth 2 (two) times daily as needed for anxiety. 60 tablet 5   amLODipine (NORVASC) 5 MG tablet Take 1 tablet (5 mg total) by mouth daily. Take additional 5 mg in pm if BP>130/>80 180 tablet 3   apixaban (ELIQUIS) 5 MG TABS tablet TAKE 1 TABLET BY MOUTH TWICE A DAY 180 tablet 2   Ascorbic Acid (VITAMIN C PO) Take by mouth.     atorvastatin (LIPITOR) 20 MG tablet TAKE 1 TABLET (20 MG TOTAL) BY MOUTH DAILY. AT NIGHT. STOP/DISCONTINUE LOVASTATIN 90 tablet 3   Cholecalciferol (VITAMIN D3 PO) Take by mouth.     diclofenac Sodium (VOLTAREN) 1 % GEL Apply 2 g topically 4 (four) times daily as needed. 150 g 11   digoxin (LANOXIN) 0.125 MG tablet TAKE 0.5 TABLETS (0.0625 MG TOTAL) BY MOUTH DAILY. 45 tablet 3   DULoxetine (CYMBALTA) 30 MG capsule TAKE 1 CAPSULE BY MOUTH EVERY DAY 90 capsule 3   furosemide (LASIX) 40 MG tablet Take 1 tablet (40 mg total) by mouth daily. 90 tablet 3   gabapentin (NEURONTIN) 300 MG capsule Take 2 capsules (600 mg total) by mouth at bedtime. 180 capsule 5   HYDROcodone-acetaminophen (NORCO/VICODIN) 5-325 MG tablet Take 1 tablet by mouth 2 (two) times daily as needed for moderate pain. 30 tablet 0   hydrocortisone 2.5 % cream Apply topically 2 (two) times daily. 30 g 2   Magnesium 250 MG TABS Take 250 mg by mouth daily.     metoprolol succinate (TOPROL-XL) 100 MG 24 hr tablet Take with or immediately following a meal. 90 tablet 3   nystatin cream (MYCOSTATIN) Apply 1 Application topically 3 (three) times daily. Prn right back 30 g 11   potassium chloride SA (KLOR-CON M20) 20 MEQ tablet Take 1 tablet (20 mEq total) by mouth daily. TAKE TABLET BY MOUTH AS DIRECTED 90 tablet 3   No current facility-administered medications on file prior to visit.    Social History   Tobacco Use   Smoking status: Former    Packs/day: 0.50  Years: 20.00    Total pack years: 10.00    Types: Cigarettes   Smokeless tobacco: Never   Tobacco comments:    quit  approx. 20 years ago  Vaping Use   Vaping Use: Never used  Substance Use Topics   Alcohol use: Yes    Alcohol/week: 0.0 standard drinks of alcohol    Comment: wine occasional   Drug use: No    Review of Systems  Constitutional:  Positive for unexpected weight change. Negative for chills and fever.  HENT:  Positive for trouble swallowing.   Respiratory:  Negative for cough.   Cardiovascular:  Negative for chest pain and palpitations.  Gastrointestinal:  Negative for nausea and vomiting.      Objective:    BP 138/82 (BP Location: Left Arm, Patient Position: Sitting, Cuff Size: Normal)   Pulse 73   Temp 98 F (36.7 C) (Oral)   Ht '5\' 7"'$  (1.702 m)   Wt 235 lb 9.6 oz (106.9 kg)   SpO2 97%   BMI 36.90 kg/m   Wt Readings from Last 3 Encounters:  12/29/21 235 lb 9.6 oz (106.9 kg)  09/03/21 242 lb 3.2 oz (109.9 kg)  05/13/21 247 lb (112 kg)   BP Readings from Last 3 Encounters:  12/29/21 138/82  09/03/21 140/80  05/13/21 133/71     Physical Exam Vitals reviewed.  Constitutional:      Appearance: She is well-developed.  Eyes:     Conjunctiva/sclera: Conjunctivae normal.  Neck:     Thyroid: No thyroid mass, thyromegaly or thyroid tenderness.  Cardiovascular:     Rate and Rhythm: Normal rate and regular rhythm.     Pulses: Normal pulses.     Heart sounds: Normal heart sounds.  Pulmonary:     Effort: Pulmonary effort is normal.     Breath sounds: Normal breath sounds. No wheezing, rhonchi or rales.  Lymphadenopathy:     Cervical:     Right cervical: No superficial, deep or posterior cervical adenopathy.    Left cervical: No superficial, deep or posterior cervical adenopathy.     Upper Body:     Right upper body: No supraclavicular adenopathy.     Left upper body: No supraclavicular adenopathy.     Comments: Left clavicle  somewhat more prominent than right.  No tenderness with palpation.  No bony step off  Skin:    General: Skin is warm and dry.  Neurological:      Mental Status: She is alert.  Psychiatric:        Speech: Speech normal.        Behavior: Behavior normal.        Thought Content: Thought content normal.        Assessment & Plan:   Problem List Items Addressed This Visit       Digestive   Dysphagia    History of esophageal stricture.  No upper endoscopy in record.  Referral to gastroenterology for evaluation of dysphagia, particularly in the setting of unintentional weight loss.  Pending baseline labs in setting weight loss.       Relevant Orders   CT SOFT TISSUE NECK W CONTRAST   Ambulatory referral to Gastroenterology   CBC with Differential/Platelet   Comprehensive metabolic panel   TSH     Other   Enlarged clavicle - Primary    Reassuring exam . I suspect likely more prominent due to 12 pound weight loss.  She has no history of cancer.  Mammogram is up-to-date.  I ordered x-ray of clavicle bilateral today and will also proceed with CT neck due to trouble swallowing also ensure no lymphadenopathy, enlarged thyroid.      Relevant Orders   DG Clavicle Left   CT SOFT TISSUE NECK W CONTRAST   Other Visit Diagnoses     Weight loss       Relevant Orders   CBC with Differential/Platelet   Comprehensive metabolic panel   TSH   Elevated glucose       Relevant Orders   POCT HgB A1C (Completed)   Need for immunization against influenza       Relevant Orders   Flu Vaccine QUAD High Dose(Fluad) (Completed)         I am having Monica [name] maintain her Magnesium, Cholecalciferol (VITAMIN D3 PO), Ascorbic Acid (VITAMIN C PO), diclofenac Sodium, hydrocortisone, allopurinol, DULoxetine, digoxin, furosemide, Eliquis, potassium chloride SA, atorvastatin, HYDROcodone-acetaminophen, ALPRAZolam, amLODipine, gabapentin, metoprolol succinate, and nystatin cream.   No orders of the defined types were placed in this encounter.   Return precautions given.   Risks, benefits, and alternatives of the medications and  treatment plan prescribed today were discussed, and patient expressed understanding.   Education regarding symptom management and diagnosis given to patient on AVS.  Continue to follow with McLean-Scocuzza, Nino Glow, MD for routine health maintenance.   Monica [name] and I agreed with plan.   Mable Paris, FNP

## 2021-12-29 NOTE — Telephone Encounter (Signed)
Lft pt daughter a vm to call ofc to sch CT. thanks

## 2021-12-29 NOTE — Assessment & Plan Note (Signed)
Reassuring exam . I suspect likely more prominent due to 12 pound weight loss.  She has no history of cancer.  Mammogram is up-to-date.  I ordered x-ray of clavicle bilateral today and will also proceed with CT neck due to trouble swallowing also ensure no lymphadenopathy, enlarged thyroid.

## 2021-12-30 ENCOUNTER — Telehealth: Payer: Self-pay | Admitting: Family

## 2021-12-30 NOTE — Telephone Encounter (Signed)
Lft pt daughter a vm to call ofc to sch CT . thanks

## 2022-01-06 ENCOUNTER — Telehealth: Payer: Self-pay | Admitting: Family Medicine

## 2022-01-06 ENCOUNTER — Ambulatory Visit (INDEPENDENT_AMBULATORY_CARE_PROVIDER_SITE_OTHER): Payer: Medicare Other | Admitting: Family Medicine

## 2022-01-06 ENCOUNTER — Encounter: Payer: Self-pay | Admitting: Family Medicine

## 2022-01-06 VITALS — BP 130/80 | HR 70 | Temp 98.6°F | Resp 14 | Ht 67.0 in | Wt 239.0 lb

## 2022-01-06 DIAGNOSIS — I110 Hypertensive heart disease with heart failure: Secondary | ICD-10-CM | POA: Diagnosis not present

## 2022-01-06 DIAGNOSIS — R040 Epistaxis: Secondary | ICD-10-CM

## 2022-01-06 DIAGNOSIS — E7849 Other hyperlipidemia: Secondary | ICD-10-CM

## 2022-01-06 DIAGNOSIS — R7303 Prediabetes: Secondary | ICD-10-CM | POA: Diagnosis not present

## 2022-01-06 DIAGNOSIS — I714 Abdominal aortic aneurysm, without rupture, unspecified: Secondary | ICD-10-CM | POA: Diagnosis not present

## 2022-01-06 DIAGNOSIS — F418 Other specified anxiety disorders: Secondary | ICD-10-CM

## 2022-01-06 DIAGNOSIS — M109 Gout, unspecified: Secondary | ICD-10-CM | POA: Diagnosis not present

## 2022-01-06 NOTE — Patient Instructions (Signed)
It was a pleasure meeting you today. Thank you for allowing me to take part in your health care.  Our goals for today as we discussed include:  Use a humidifier at night to help keep nasal mucus moist Avoid scratching nostril If any further bleeding please that lasts longer than 30 mins call 911 or have someone take you to emergency department  Follow up with Cardiology as scheduled Restart your Lasix 40 mg daily as previously ordered  Please follow-up with PCP for annual wellness in December  If you have any questions or concerns, please do not hesitate to call the office at [phone].  I look forward to our next visit and until then take care and stay safe.  Regards,   Carollee Leitz, MD   [facility]

## 2022-01-06 NOTE — Progress Notes (Signed)
SUBJECTIVE:   Chief Complaint  Patient presents with   Transitions Of Care    Disc about A1C, clot of blood came out of pt's blood on Tuesday morning around 2-3 am blood cleared after awhile prior hx before years ago. Disc issue w/clavicle was seen by Joycelyn Schmid for this, L foot swelling.    HPI Patient presents to clinic with daughter other  Prediabetes Concerned that recent A1c 6.0.  Is wondering if she needs to be on medications. Asymptomatic.    Coughed up blood Patient reports when blowing nose had a long blood clot.  Occurred after night sleep and noticed some blood coming out of her mouth.  Has not had any recurrence of symptoms since then.  Denies any shortness of breath, cough, shortness of breath, difficulty breathing fevers, night sweats, chills, bone pain, weight loss, decrease in appetite.  Denies any recent sick contacts.  Endorses rhinorrhea and scratches at nose periodically.  She is also currently on anticoagulation at decreases clotting time.  Reports has had previous symptoms in the past.  Lower extremity swelling. Patient reports having noticed some left foot swelling recently.  Was previously on Lasix 40 mg for CHF but had not been taking her Lasix secondary to increase in voiding.  Denies any shortness of breath, chest pain, heart palpitations, pain in legs, decrease sensation, numbness or tingling.  Has not tried any elevation of legs or compression stockings.   HFpEF/Hypertension Increase in lower extremity edema.  Not taking Lasix as prescribed by Cardiology.  Amiodarone was discontinued at last visit 07/2021 by Cards.  Taking Digoxin 0.0625 mg daily, Metoprolol XR 100 mg daily, Amlodipine 5 mg daily and Potassium 20 mEq daily.  Denies any chest pain, heart palpitations, shortness of breath, nausea/vomiting, diarrhea or abdominal pain.  HLD Taking Atorvastatin 20 mg daily.  Tolerating well.  No myalgias  H/O Lt MCA stroke Doing well.  On Eliquis 5 mg twice daily  and statin.  ASA was not recommended.  Gout No recent flares.  Controlled with allopurinol 100 mg 2 tablets daily.  PERTINENT PMH / PSH: Hypertension HFpEF Left MCA stroke History of bilateral DVT AAA History of lower GI bleed  OBJECTIVE:  BP 130/80 (BP Location: Left Wrist, Patient Position: Sitting, Cuff Size: Normal)   Pulse 70   Temp 98.6 F (37 C) (Oral)   Resp 14   Ht '5\' 7"'$  (1.702 m)   Wt 239 lb (108.4 kg)   SpO2 96%   BMI 37.43 kg/m    Physical Exam Vitals reviewed.  Constitutional:      General: She is not in acute distress.    Appearance: She is not ill-appearing.  HENT:     Head: Normocephalic.     Nose: Nose normal.  Eyes:     Conjunctiva/sclera: Conjunctivae normal.  Cardiovascular:     Rate and Rhythm: Normal rate and regular rhythm.     Heart sounds: Normal heart sounds.  Pulmonary:     Effort: Pulmonary effort is normal.     Breath sounds: Normal breath sounds.  Abdominal:     General: Abdomen is flat. Bowel sounds are normal.     Palpations: Abdomen is soft.  Musculoskeletal:        General: Normal range of motion.     Cervical back: Normal range of motion.     Right lower leg: Edema (trace) present.     Left lower leg: Edema (trace) present.  Neurological:     Mental Status: She  is alert and oriented to person, place, and time. Mental status is at baseline.  Psychiatric:        Mood and Affect: Mood normal.        Behavior: Behavior normal.        Thought Content: Thought content normal.        Judgment: Judgment normal.     ASSESSMENT/PLAN:  Hypertensive heart disease with heart failure [facility]) Assessment & Plan: Follows with Cardiology. Lung exam wnl.  Trace bilateral lower extremity edema on exam Continue Amlodipine 5 mg daily Continue Metoprolol XR 100 mg daily Continue Digoxin 0.0625 mg daily, maintain Dig level between 05-1.0 Restart Lasix 40 mg daily as previously prescribed Continue Potassium 20 mEq daily Compression stockings  daily Recommend she follow up with Cardiology as scheduled   Abdominal aortic aneurysm (AAA) without rupture, unspecified part [facility]) Assessment & Plan: New mild dilation of aortic aneurysm of the distal abdominal aorta noted on CT abd/pelvis 03/19/19 Recommended follow up abdominal u/s in 3 years to monitor stability. -Follows with cardiology -Continue statin therapy and hypertension management   Morbid obesity ([place])  Morbid (severe) obesity due to excess calories ([place]) Assessment & Plan: History of prediabetes, hyperlipidemia, heart failure, hypertension, elevated BMI.  Could possibly benefit from GLP-1 therapy Plan to discuss with patient at next visit.    Epistaxis Assessment & Plan: Chronic.  Recent episode that has since resolved.  Likely uncontrolled secondary to long-term anticoagulation.  Low suspicion for infectious process, GI, etiology given no other symptoms and is since resolved.   Recommend humidifier Avoid picking or scratching inside nose Avoid NSAIDs, Goody powders, or anything containing NSAID products. Strict return precautions provided    Prediabetes Assessment & Plan: Recent A1c at 6.0.  Discussed with patient no indication for medication therapy given A1c goal for age less than 8. -Encourage healthy lifestyle and better nutritional choices -Can repeat A1c in 1 to 2 years.   Other hyperlipidemia Assessment & Plan: Chronic.  Doing well.  No myalgias. Continue atorvastatin 20 mg daily   Depression with anxiety Assessment & Plan: Currently on Cymbalta 30 mg daily and Xanax 0.25 mg daily.  Would like to decrease Xanax given patient has history of falls.  Plan to discuss with patient at next visit. PDMP reviewed last fill of Xanax 07/23.  Patient will need appointment prior to any further refills.   Gout of shoulder region, unspecified cause, unspecified chronicity, unspecified laterality Assessment & Plan: Controlled.  No recent flares Continue  allopurinol 200 mg daily     PDMP reviewed  Return in about 1 month (around 02/05/2022).  Carollee Leitz, MD

## 2022-01-06 NOTE — Telephone Encounter (Signed)
I'm not sure what for.  She didn't ask me for that during office visit.  Need to find out why she wants referral.

## 2022-01-06 NOTE — Telephone Encounter (Signed)
I spoke with pt daughter she wanted to see if she can get an referral placed for [place] [facility]? Please advise and Thank you! When you place referral please put in comments [place] [facility] Thank you.

## 2022-01-07 NOTE — Telephone Encounter (Signed)
Ok.  She was seen by Joycelyn Schmid for this concern. I'm ok with the referral to [facility] if that what she wants.  Would ask  Joycelyn Schmid as she seen patient for concern and may want results.

## 2022-01-07 NOTE — Telephone Encounter (Signed)
No worries.  She is my patient.  I just never ordered it so I thought maybe Monica [name] might want the results. I'm fine with follow up with results.

## 2022-01-17 ENCOUNTER — Ambulatory Visit: Payer: Medicare Other

## 2022-01-19 DIAGNOSIS — M25562 Pain in left knee: Secondary | ICD-10-CM | POA: Diagnosis not present

## 2022-01-19 DIAGNOSIS — R2681 Unsteadiness on feet: Secondary | ICD-10-CM | POA: Diagnosis not present

## 2022-01-19 DIAGNOSIS — M25511 Pain in right shoulder: Secondary | ICD-10-CM | POA: Diagnosis not present

## 2022-01-23 ENCOUNTER — Encounter: Payer: Self-pay | Admitting: Family Medicine

## 2022-01-23 ENCOUNTER — Other Ambulatory Visit: Payer: Self-pay | Admitting: Interventional Cardiology

## 2022-01-23 DIAGNOSIS — I110 Hypertensive heart disease with heart failure: Secondary | ICD-10-CM | POA: Insufficient documentation

## 2022-01-23 NOTE — Assessment & Plan Note (Addendum)
Chronic.  Recent episode that has since resolved.  Likely uncontrolled secondary to long-term anticoagulation.  Low suspicion for infectious process, GI, etiology given no other symptoms and is since resolved.   Recommend humidifier Avoid picking or scratching inside nose Avoid NSAIDs, Goody powders, or anything containing NSAID products. Strict return precautions provided

## 2022-01-23 NOTE — Assessment & Plan Note (Signed)
Recent A1c at 6.0.  Discussed with patient no indication for medication therapy given A1c goal for age less than 8. -Encourage healthy lifestyle and better nutritional choices -Can repeat A1c in 1 to 2 years.

## 2022-01-23 NOTE — Assessment & Plan Note (Signed)
New mild dilation of aortic aneurysm of the distal abdominal aorta noted on CT abd/pelvis 03/19/19 Recommended follow up abdominal u/s in 3 years to monitor stability. -Follows with cardiology -Continue statin therapy and hypertension management

## 2022-01-23 NOTE — Assessment & Plan Note (Signed)
History of prediabetes, hyperlipidemia, heart failure, hypertension, elevated BMI.  Could possibly benefit from GLP-1 therapy Plan to discuss with patient at next visit.

## 2022-01-23 NOTE — Assessment & Plan Note (Addendum)
Follows with Cardiology. Lung exam wnl.  Trace bilateral lower extremity edema on exam Continue Amlodipine 5 mg daily Continue Metoprolol XR 100 mg daily Continue Digoxin 0.0625 mg daily, maintain Dig level between 05-1.0 Restart Lasix 40 mg daily as previously prescribed Continue Potassium 20 mEq daily Compression stockings daily Recommend she follow up with Cardiology as scheduled

## 2022-01-23 NOTE — Assessment & Plan Note (Signed)
Controlled.  No recent flares Continue allopurinol 200 mg daily

## 2022-01-23 NOTE — Assessment & Plan Note (Signed)
Currently on Cymbalta 30 mg daily and Xanax 0.25 mg daily.  Would like to decrease Xanax given patient has history of falls.  Plan to discuss with patient at next visit. PDMP reviewed last fill of Xanax 07/23.  Patient will need appointment prior to any further refills.

## 2022-01-23 NOTE — Assessment & Plan Note (Deleted)
Per JNC 8 guidelines for age blood pressure at goal less than 150/90. Continue amlodipine 5 mg daily

## 2022-01-23 NOTE — Assessment & Plan Note (Signed)
Chronic.  Doing well.  No myalgias. Continue atorvastatin 20 mg daily

## 2022-01-24 NOTE — Telephone Encounter (Signed)
Prescription refill request for Eliquis received. Indication:afib Last office visit:1/23 Scr:0.9 Age: 86 Weight:108.4  kg  Prescription refilled

## 2022-01-26 ENCOUNTER — Ambulatory Visit: Payer: Medicare Other | Attending: Family

## 2022-02-08 ENCOUNTER — Ambulatory Visit (INDEPENDENT_AMBULATORY_CARE_PROVIDER_SITE_OTHER): Payer: Medicare Other

## 2022-02-08 VITALS — Ht 67.0 in | Wt 239.0 lb

## 2022-02-08 DIAGNOSIS — Z0000 Encounter for general adult medical examination without abnormal findings: Secondary | ICD-10-CM

## 2022-02-08 NOTE — Patient Instructions (Addendum)
Ms. Graig , Thank you for taking time to come for your Medicare Wellness Visit. I appreciate your ongoing commitment to your health goals. Please review the following plan we discussed and let me know if I can assist you in the future.   These are the goals we discussed:  Goals      Maintain healthy lifestyle     Stay active Healthy diet Seek out personal trainer at the [facility]        This is a list of the screening recommended for you and due dates:  Health Maintenance  Topic Date Due   COVID-19 Vaccine (5 - 2023-24 season) 02/24/2022*   Zoster (Shingles) Vaccine (1 of 2) 04/24/2022*   Mammogram  06/17/2022   Medicare Annual Wellness Visit  02/09/2023   Pneumonia Vaccine  Completed   Flu Shot  Completed   DEXA scan (bone density measurement)  Completed   HPV Vaccine  Aged Out   DTaP/Tdap/Td vaccine  Discontinued  *Topic was postponed. The date shown is not the original due date.   Conditions/risks identified: none new  Next appointment: Follow up in one year for your annual wellness visit    Preventive Care 65 Years and Older, Female Preventive care refers to lifestyle choices and visits with your health care provider that can promote health and wellness. What does preventive care include? A yearly physical exam. This is also called an annual well check. Dental exams once or twice a year. Routine eye exams. Ask your health care provider how often you should have your eyes checked. Personal lifestyle choices, including: Daily care of your teeth and gums. Regular physical activity. Eating a healthy diet. Avoiding tobacco and drug use. Limiting alcohol use. Practicing safe sex. Taking low-dose aspirin every day. Taking vitamin and mineral supplements as recommended by your health care provider. What happens during an annual well check? The services and screenings done by your health care provider during your annual well check will depend on your age, overall health,  lifestyle risk factors, and family history of disease. Counseling  Your health care provider may ask you questions about your: Alcohol use. Tobacco use. Drug use. Emotional well-being. Home and relationship well-being. Sexual activity. Eating habits. History of falls. Memory and ability to understand (cognition). Work and work Statistician. Reproductive health. Screening  You may have the following tests or measurements: Height, weight, and BMI. Blood pressure. Lipid and cholesterol levels. These may be checked every 5 years, or more frequently if you are over 73 years old. Skin check. Lung cancer screening. You may have this screening every year starting at age 53 if you have a 30-pack-year history of smoking and currently smoke or have quit within the past 15 years. Fecal occult blood test (FOBT) of the stool. You may have this test every year starting at age 92. Flexible sigmoidoscopy or colonoscopy. You may have a sigmoidoscopy every 5 years or a colonoscopy every 10 years starting at age 37. Hepatitis C blood test. Hepatitis B blood test. Sexually transmitted disease (STD) testing. Diabetes screening. This is done by checking your blood sugar (glucose) after you have not eaten for a while (fasting). You may have this done every 1-3 years. Bone density scan. This is done to screen for osteoporosis. You may have this done starting at age 81. Mammogram. This may be done every 1-2 years. Talk to your health care provider about how often you should have regular mammograms. Talk with your health care provider about your test  results, treatment options, and if necessary, the need for more tests. Vaccines  Your health care provider may recommend certain vaccines, such as: Influenza vaccine. This is recommended every year. Tetanus, diphtheria, and acellular pertussis (Tdap, Td) vaccine. You may need a Td booster every 10 years. Zoster vaccine. You may need this after age  46. Pneumococcal 13-valent conjugate (PCV13) vaccine. One dose is recommended after age 65. Pneumococcal polysaccharide (PPSV23) vaccine. One dose is recommended after age 20. Talk to your health care provider about which screenings and vaccines you need and how often you need them. This information is not intended to replace advice given to you by your health care provider. Make sure you discuss any questions you have with your health care provider. Document Released: 03/06/2015 Document Revised: 10/28/2015 Document Reviewed: 12/09/2014 Elsevier Interactive Patient Education  2017 [place] Prevention in the Home Falls can cause injuries. They can happen to people of all ages. There are many things you can do to make your home safe and to help prevent falls. What can I do on the outside of my home? Regularly fix the edges of walkways and driveways and fix any cracks. Remove anything that might make you trip as you walk through a door, such as a raised step or threshold. Trim any bushes or trees on the path to your home. Use bright outdoor lighting. Clear any walking paths of anything that might make someone trip, such as rocks or tools. Regularly check to see if handrails are loose or broken. Make sure that both sides of any steps have handrails. Any raised decks and porches should have guardrails on the edges. Have any leaves, snow, or ice cleared regularly. Use sand or salt on walking paths during winter. Clean up any spills in your garage right away. This includes oil or grease spills. What can I do in the bathroom? Use night lights. Install grab bars by the toilet and in the tub and shower. Do not use towel bars as grab bars. Use non-skid mats or decals in the tub or shower. If you need to sit down in the shower, use a plastic, non-slip stool. Keep the floor dry. Clean up any water that spills on the floor as soon as it happens. Remove soap buildup in the tub or shower  regularly. Attach bath mats securely with double-sided non-slip rug tape. Do not have throw rugs and other things on the floor that can make you trip. What can I do in the bedroom? Use night lights. Make sure that you have a light by your bed that is easy to reach. Do not use any sheets or blankets that are too big for your bed. They should not hang down onto the floor. Have a firm chair that has side arms. You can use this for support while you get dressed. Do not have throw rugs and other things on the floor that can make you trip. What can I do in the kitchen? Clean up any spills right away. Avoid walking on wet floors. Keep items that you use a lot in easy-to-reach places. If you need to reach something above you, use a strong step stool that has a grab bar. Keep electrical cords out of the way. Do not use floor polish or wax that makes floors slippery. If you must use wax, use non-skid floor wax. Do not have throw rugs and other things on the floor that can make you trip. What can I do with my stairs?  Do not leave any items on the stairs. Make sure that there are handrails on both sides of the stairs and use them. Fix handrails that are broken or loose. Make sure that handrails are as long as the stairways. Check any carpeting to make sure that it is firmly attached to the stairs. Fix any carpet that is loose or worn. Avoid having throw rugs at the top or bottom of the stairs. If you do have throw rugs, attach them to the floor with carpet tape. Make sure that you have a light switch at the top of the stairs and the bottom of the stairs. If you do not have them, ask someone to add them for you. What else can I do to help prevent falls? Wear shoes that: Do not have high heels. Have rubber bottoms. Are comfortable and fit you well. Are closed at the toe. Do not wear sandals. If you use a stepladder: Make sure that it is fully opened. Do not climb a closed stepladder. Make sure that  both sides of the stepladder are locked into place. Ask someone to hold it for you, if possible. Clearly mark and make sure that you can see: Any grab bars or handrails. First and last steps. Where the edge of each step is. Use tools that help you move around (mobility aids) if they are needed. These include: Canes. Walkers. Scooters. Crutches. Turn on the lights when you go into a dark area. Replace any light bulbs as soon as they burn out. Set up your furniture so you have a clear path. Avoid moving your furniture around. If any of your floors are uneven, fix them. If there are any pets around you, be aware of where they are. Review your medicines with your doctor. Some medicines can make you feel dizzy. This can increase your chance of falling. Ask your doctor what other things that you can do to help prevent falls. This information is not intended to replace advice given to you by your health care provider. Make sure you discuss any questions you have with your health care provider. Document Released: 12/04/2008 Document Revised: 07/16/2015 Document Reviewed: 03/14/2014 Elsevier Interactive Patient Education  2017 [employer].

## 2022-02-08 NOTE — Progress Notes (Signed)
Subjective:   Monica [name] is a 86 y.o. female who presents for Medicare Annual (Subsequent) preventive examination.  Review of Systems    No ROS.  Medicare Wellness Virtual Visit.  Visual/audio telehealth visit, UTA vital signs.   See social history for additional risk factors.   Cardiac Risk Factors include: advanced age (>10mn, >>19women)     Objective:    Today's Vitals   02/08/22 1259  Weight: 239 lb (108.4 kg)  Height: '5\' 7"'$  (1.702 m)   Body mass index is 37.43 kg/m.     02/08/2022   12:46 PM 04/01/2020    2:02 PM 01/21/2020    3:16 PM 01/02/2019    6:27 PM 09/01/2018    8:54 PM 12/12/2016    3:06 PM 10/02/2016    9:55 PM  Advanced Directives  Does Patient Have a Medical Advance Directive? No No Yes No No Yes No  Type of AScientist, physiologicalof A[place]baLiving will   [place] will   Does patient want to make changes to medical advance directive?   No - Patient declined      Copy of HLilydalein Chart?   No - copy requested      Would patient like information on creating a medical advance directive? No - Patient declined   No - Patient declined No - Patient declined  No - Patient declined    Current Medications (verified) Outpatient Encounter Medications as of 02/08/2022  Medication Sig   allopurinol (ZYLOPRIM) 100 MG tablet TAKE 2 TABLETS BY MOUTH EVERY DAY   ALPRAZolam (XANAX) 0.25 MG tablet Take 1 tablet (0.25 mg total) by mouth 2 (two) times daily as needed for anxiety.   amLODipine (NORVASC) 5 MG tablet Take 1 tablet (5 mg total) by mouth daily. Take additional 5 mg in pm if BP>130/>80   atorvastatin (LIPITOR) 20 MG tablet TAKE 1 TABLET (20 MG TOTAL) BY MOUTH DAILY. AT NIGHT. STOP/DISCONTINUE LOVASTATIN   Cholecalciferol (VITAMIN D3 PO) Take by mouth.   digoxin (LANOXIN) 0.125 MG tablet TAKE 0.5 TABLETS (0.0625 MG TOTAL) BY MOUTH DAILY.   DULoxetine (CYMBALTA) 30 MG capsule TAKE 1 CAPSULE BY  MOUTH EVERY DAY   ELIQUIS 5 MG TABS tablet TAKE 1 TABLET BY MOUTH TWICE A DAY   furosemide (LASIX) 40 MG tablet Take 1 tablet (40 mg total) by mouth daily.   gabapentin (NEURONTIN) 300 MG capsule Take 2 capsules (600 mg total) by mouth at bedtime.   hydrocortisone 2.5 % cream Apply topically 2 (two) times daily.   Magnesium 250 MG TABS Take 250 mg by mouth daily.   metoprolol succinate (TOPROL-XL) 100 MG 24 hr tablet Take with or immediately following a meal.   nystatin cream (MYCOSTATIN) Apply 1 Application topically 3 (three) times daily. Prn right back   potassium chloride SA (KLOR-CON M20) 20 MEQ tablet Take 1 tablet (20 mEq total) by mouth daily. TAKE TABLET BY MOUTH AS DIRECTED   No facility-administered encounter medications on file as of 02/08/2022.    Allergies (verified) Patient has no active allergies.   History: Past Medical History:  Diagnosis Date   Abnormal gait 09/22/2021   Abnormal weight gain 02/24/2021   Allergic rhinitis    Anxiety    Benign neoplasm of breast 2013   Bilateral shoulder pain 01/13/2020   Cardiac arrhythmia    Cardiomegaly    Cataract    Cervicalgia 01/13/2020   CHF (congestive heart failure) (  [place])    Chronic back pain 12/26/2018   Severe stenosis L spine       Chronic cough 04/15/2016   Chronic knee pain 07/22/2020   Chronic left shoulder pain 06/07/2019   Colon cancer screening 02/24/2021   Compulsive eating patterns    Compulsive overeating    Compulsive overeating 01/01/2015   Degenerative lumbar spinal stenosis 09/20/2017   Depression    Diastolic heart failure (HCC)    LVH with free wall thickness 1.4 cm EF 60% Dr. Tamala Julian    Diffuse cystic mastopathy 2013   Diverticulosis of large intestine without perforation or abscess without bleeding    Dysuria 06/03/2020   Edema    Enlarged clavicle 12/29/2021   Epistaxis    noted 12/20/18    Essential hypertension 12/21/2006   Generalized abdominal pain 01/03/2019   GI bleeding    in  ~2018    Gout    Grief 01/13/2020   Hiatal hernia    HTN (hypertension)    Hyperlipidemia    Iron deficiency anemia 01/03/2019   Leg DVT (deep venous thromboembolism), acute, bilateral ([place]) 12/26/2018   Noted [place] legs 09/02/18 b/l       Lower GI bleed 10/03/2016   Lump or mass in breast 2012   Memory loss 03/06/2019   Middle cerebral artery embolism, 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   OA (osteoarthritis) of knee    with injections   Obesity    OSA (obstructive sleep apnea)    not on cpap   Other abnormal glucose    Partial nontraumatic rupture of right rotator cuff 09/22/2015   Pelvic floor dysfunction 06/06/2019   Peptic stricture of esophagus    Personal history of tobacco use, presenting hazards to health    Rectal bleeding 02/24/2021   Recurrent falls 01/13/2020   Sciatica    Seborrheic keratoses 04/15/2016   Sinus infection 2010   Spinal stenosis    lumbar    Status post bilateral knee replacements 05/04/2016   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   09/01/2018   Established GNA with thrombectomy intracranial in hospital    Stroke [facility])    Unspecified sleep apnea    Urge incontinence of urine 09/09/2011   Urine frequency 02/17/2021   UTI (urinary tract infection)    e coli 06/2020   Past Surgical History:  Procedure Laterality Date   adenosine cardiolite  1/08   low risk    APPENDECTOMY     BREAST BIOPSY     02/28/11 negative   BREAST LUMPECTOMY     right x1 ('89) left x2 ('70s, '90)   CARDIAC CATHETERIZATION  2001   normal per pt   COLONOSCOPY  11/02   diverticulosis   COLONOSCOPY  9/06   diverticulosis, hemorhoids   COLONOSCOPY N/A 10/01/2016   Procedure: COLONOSCOPY;  Surgeon: Gatha Mayer, MD;  Location: [facility];  Service: Endoscopy;  Laterality: N/A;   dexa  3/02   normal   EYE SURGERY  2012   cataract   IR CT HEAD LTD  09/01/2018   IR PERCUTANEOUS ART THROMBECTOMY/INFUSION INTRACRANIAL INC DIAG ANGIO  09/01/2018   JOINT REPLACEMENT     knee b/l 2011   knee  replacement Right 9/11   R. Dr. Telford Nab   RADIOLOGY WITH ANESTHESIA N/A 09/01/2018   Procedure: IR WITH ANESTHESIA;  Surgeon: Luanne Bras, MD;  Location: [place];  Service: Radiology;  Laterality: N/A;   sleep study  5/05   TONSILLECTOMY     TOTAL ABDOMINAL HYSTERECTOMY  fibroids, age of 58   TOTAL KNEE ARTHROPLASTY Left 2012   US TRANSVAGINAL PELVIC MODIFIED  2000, 2001   Family History  Problem Relation Age of Onset   Hypertension Mother        (a lot of animosity in their relationship)   Heart failure Mother    Stroke Father    Breast cancer Sister    Cancer Sister        breast cancer   Prostate cancer Brother    Cancer Brother        prostate    Hepatitis Brother        Hep C; alcoholism (terminal)   Drug abuse Brother        died 44   Other Brother        drug addiction   Gout Other        whole family    Social History   Socioeconomic History   Marital status: Widowed    Spouse name: Not on file   Number of children: 1   Years of education: Not on file   Highest education level: Not on file  Occupational History   Occupation: Retired school principal    Employer: RETIRED  Tobacco Use   Smoking status: Former    Packs/day: 0.50    Years: 20.00    Total pack years: 10.00    Types: Cigarettes   Smokeless tobacco: Never   Tobacco comments:    quit approx. 20 years ago  Vaping Use   Vaping Use: Never used  Substance and Sexual Activity   Alcohol use: Yes    Alcohol/week: 0.0 standard drinks of alcohol    Comment: wine occasional   Drug use: No   Sexual activity: Never  Other Topics Concern   Not on file  Social History Narrative   Widowed x 30+ years as of 12/2018    1 daughter. Retired principal. 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    Social Determinants of Radio broadcast assistant Strain: [place]  (02/08/2022)   Overall Financial Resource Strain (CARDIA)    Difficulty of Paying Living  Expenses: Not hard at all  Food Insecurity: No Food Insecurity (02/08/2022)   Hunger Vital Sign    Worried About Running Out of Food in the Last Year: Never true    Ran Out of Food in the Last Year: Never true  Transportation Needs: No Transportation Needs (02/08/2022)   PRAPARE - Hydrologist (Medical): No    Lack of Transportation (Non-Medical): No  Physical Activity: Not on file  Stress: No Stress Concern Present (04/01/2020)   [place]    Feeling of Stress : Not at all  Social Connections: Unknown (02/08/2022)   Social Connection and Isolation Panel [NHANES]    Frequency of Communication with Friends and Family: Three times a week    Frequency of Social Gatherings with Friends and Family: Three times a week    Attends Religious Services: More than 4 times per year    Active Member of Clubs or Organizations: Not on file    Attends Archivist Meetings: Not on file    Marital Status: Not on file    Tobacco Counseling Counseling given: Not Answered Tobacco comments: quit approx. 20 years ago   Clinical Intake:  Pre-visit preparation completed: Yes  Diabetes: No  How often do you need to have someone help you when you read instructions, pamphlets, or other written materials from your doctor or pharmacy?: 1 - Never    Interpreter Needed?: No      Activities of Daily Living    02/08/2022   12:46 PM  In your present state of health, do you have any difficulty performing the following activities:  Hearing? 0  Vision? 0  Difficulty concentrating or making decisions? 0  Walking or climbing stairs? 0  Dressing or bathing? 0  Doing errands, shopping? 0  Preparing Food and eating ? N  Using the Toilet? N  In the past six months, have you accidently leaked urine? N  Do you have problems with loss of bowel control? N  Managing your Medications? N  Managing  your Finances? N  Housekeeping or managing your Housekeeping? N    Patient Care Team: Carollee Leitz, MD as PCP - General (Family Medicine) Belva Crome, MD as PCP - Cardiology (Cardiology) Christene Lye, MD (General Surgery) Tower, Wynelle Fanny, MD as Consulting Physician (Family Medicine)  Indicate any recent Medical Services you may have received from other than [facility] providers in the past year (date may be approximate).     Assessment:   This is a routine wellness examination for Monica [name].  I connected with  Les Pou on 02/08/22 by a audio enabled telemedicine application and verified that I am speaking with the correct person using two identifiers.  Patient Location: Home  Provider Location: Office/Clinic  I discussed the limitations of evaluation and management by telemedicine. The patient expressed understanding and agreed to proceed.   Hearing/Vision screen Hearing Screening - Comments:: Patient is able to hear conversational tones without difficulty.  No issues reported.   Vision Screening - Comments:: Cataracts extracted, bilateral Wears corrective lenses They have seen their ophthalmologist in the last 12 months.   Dietary issues and exercise activities discussed: Current Exercise Habits: Home exercise routine, Intensity: Mild Regular diet   Goals Addressed             This Visit's Progress    Maintain healthy lifestyle       Stay active Healthy diet Seek out personal trainer at the [place]    02/08/2022   12:50 PM 01/06/2022    3:20 PM 12/29/2021   11:52 AM 12/29/2021   10:28 AM 09/03/2021   11:26 AM 02/17/2021    2:06 PM 06/26/2020   10:21 AM  PHQ 2/9 Scores  PHQ - 2 Score 0 '1 2 2 1 '$ 0 0  PHQ- 9 Score 0  6    0    Fall Risk    02/08/2022   12:42 PM 01/06/2022    3:19 PM 12/29/2021   10:27 AM 09/03/2021   11:26 AM 02/17/2021    2:06 PM  Fall Risk   Falls in the past year? 0 0 0 0 0  Number falls in past  yr: 0 0 0 0 0  Injury with Fall? 0 0 0 0 0  Risk for fall due to :  No Fall Risks No Fall Risks No Fall Risks No Fall Risks  Follow up Falls evaluation completed;Falls prevention discussed Falls evaluation completed Falls evaluation completed Falls evaluation completed Falls evaluation completed    FALL RISK PREVENTION PERTAINING TO THE HOME: Home free of loose throw rugs in walkways, pet beds, electrical cords, etc? Yes  Adequate lighting  in your home to reduce risk of falls? Yes   ASSISTIVE DEVICES UTILIZED TO PREVENT FALLS: Life alert? Yes  Use of a cane, walker or w/c? Yes , cane as needed Grab bars in the bathroom? Yes  Shower chair or bench in shower? No  Elevated toilet seat or a handicapped toilet? Yes   TIMED UP AND GO: Was the test performed? No .   Cognitive Function:        02/08/2022   12:52 PM  6CIT Screen  What Year? 0 points  What month? 0 points  What time? 0 points  Count back from 20 0 points  Months in reverse 0 points  Repeat phrase 0 points  Total Score 0 points    Immunizations Immunization History  Administered Date(s) Administered   Fluad Quad(high Dose 65+) 12/05/2018, 12/25/2019, 10/27/2020, 12/29/2021   Influenza Split 12/01/2010, 01/10/2012   Influenza, High Dose Seasonal PF 12/03/2015   Influenza,inj,Quad PF,6+ Mos 11/27/2012, 12/03/2013, 12/31/2014   PFIZER Comirnaty(Gray Top)Covid-19 Tri-Sucrose Vaccine 08/19/2020   PFIZER(Purple Top)SARS-COV-2 Vaccination 03/28/2019, 04/18/2019, 12/25/2019   Pneumococcal Conjugate-13 02/11/2015   Pneumococcal Polysaccharide-23 08/01/1996, 12/03/2013   Td 04/20/1995   Screening Tests Health Maintenance  Topic Date Due   COVID-19 Vaccine (5 - 2023-24 season) 02/24/2022 (Originally 10/22/2021)   Zoster Vaccines- Shingrix (1 of 2) 04/24/2022 (Originally 08/27/1954)   MAMMOGRAM  06/17/2022   Medicare Annual Wellness (AWV)  02/09/2023   Pneumonia Vaccine 72+ Years old  Completed   INFLUENZA VACCINE   Completed   DEXA SCAN  Completed   HPV VACCINES  Aged Out   DTaP/Tdap/Td  Discontinued   Health Maintenance There are no preventive care reminders to display for this patient.   Lung Cancer Screening: (Low Dose CT Chest recommended if Age 79-80 years, 30 pack-year currently smoking OR have quit w/in 15years.) does not qualify.   Hepatitis C Screening: does not qualify.  Vision Screening: Recommended annual ophthalmology exams for early detection of glaucoma and other disorders of the eye.  Dental Screening: Recommended annual dental exams for proper oral hygiene.  Community Resource Referral / Chronic Care Management: CRR required this visit?  No   CCM required this visit?  No      Plan:     I have personally reviewed and noted the following in the patient's chart:   Medical and social history Use of alcohol, tobacco or illicit drugs  Current medications and supplements including opioid prescriptions. Patient is not currently taking opioid prescriptions. Functional ability and status Nutritional status Physical activity Advanced directives List of other physicians Hospitalizations, surgeries, and ER visits in previous 12 months Vitals Screenings to include cognitive, depression, and falls Referrals and appointments  In addition, I have reviewed and discussed with patient certain preventive protocols, quality metrics, and best practice recommendations. A written personalized care plan for preventive services as well as general preventive health recommendations were provided to patient.     Leta Jungling, LPN   [id number]

## 2022-02-09 ENCOUNTER — Encounter: Payer: Self-pay | Admitting: Family Medicine

## 2022-02-09 ENCOUNTER — Ambulatory Visit (INDEPENDENT_AMBULATORY_CARE_PROVIDER_SITE_OTHER): Payer: Medicare Other | Admitting: Family Medicine

## 2022-02-09 VITALS — BP 128/82 | HR 70 | Temp 97.9°F | Ht 67.0 in | Wt 237.0 lb

## 2022-02-09 DIAGNOSIS — Z23 Encounter for immunization: Secondary | ICD-10-CM

## 2022-02-09 DIAGNOSIS — I4892 Unspecified atrial flutter: Secondary | ICD-10-CM

## 2022-02-09 DIAGNOSIS — I110 Hypertensive heart disease with heart failure: Secondary | ICD-10-CM

## 2022-02-09 DIAGNOSIS — Z0000 Encounter for general adult medical examination without abnormal findings: Secondary | ICD-10-CM

## 2022-02-09 DIAGNOSIS — N3281 Overactive bladder: Secondary | ICD-10-CM

## 2022-02-09 DIAGNOSIS — I714 Abdominal aortic aneurysm, without rupture, unspecified: Secondary | ICD-10-CM

## 2022-02-09 DIAGNOSIS — M5416 Radiculopathy, lumbar region: Secondary | ICD-10-CM

## 2022-02-09 MED ORDER — GABAPENTIN 300 MG PO CAPS: 300.0000 mg | ORAL_CAPSULE | Freq: Every day | ORAL | 0 refills | Status: DC

## 2022-02-09 NOTE — Progress Notes (Signed)
SUBJECTIVE:   Chief Complaint  Patient presents with   Annual Exam   HPI Patient presents to clinic for annual physical.  No acute concerns today.  She reports she is doing well.  Her daughter was currently on vacation and she is enjoying her time at home alone.  She reports enjoying doing things for herself and on home time.  Denies any recent falls Denies any SI/HI   PERTINENT PMH / PSH: Hypertension Number radiculopathy Obesity class III   OBJECTIVE:  BP 128/82   Pulse 70   Temp 97.9 F (36.6 C)   Ht '5\' 7"'$  (1.702 m)   Wt 237 lb (107.5 kg)   SpO2 98%   BMI 37.12 kg/m    Physical Exam Vitals reviewed.  Constitutional:      General: She is not in acute distress.    Appearance: She is not ill-appearing.  HENT:     Head: Normocephalic.     Nose: Nose normal.  Eyes:     Conjunctiva/sclera: Conjunctivae normal.  Cardiovascular:     Rate and Rhythm: Normal rate and regular rhythm.     Heart sounds: Normal heart sounds.  Pulmonary:     Effort: Pulmonary effort is normal.     Breath sounds: Normal breath sounds.  Abdominal:     General: Abdomen is flat. Bowel sounds are normal.     Palpations: Abdomen is soft.  Musculoskeletal:        General: Normal range of motion.     Cervical back: Normal range of motion.  Neurological:     Mental Status: She is alert and oriented to person, place, and time. Mental status is at baseline.  Psychiatric:        Mood and Affect: Mood normal.        Behavior: Behavior normal.        Thought Content: Thought content normal.        Judgment: Judgment normal.     ASSESSMENT/PLAN:  Annual physical exam Assessment & Plan: Colonoscopy up-to-date.  No indication for continued screening Mammogram due 08/24, previously ordered by former PCP Declined shingles Pneumonia 20 vaccine today. DEXA completed, no indication for continued monitoring Recommend RSV vaccine Follow-up 1 year for repeat annual    Hypertensive heart  disease with heart failure ([place])  Morbid (severe) obesity due to excess calories [facility]) Assessment & Plan: History of prediabetes, hyperlipidemia, heart failure, hypertension, elevated BMI.  Could possibly benefit from GLP-1 therapy Will need to discuss with patient at next visit given limited time today.  Patient agreeable to plan.     Abdominal aortic aneurysm (AAA) without rupture, unspecified part [facility]) Assessment & Plan: Chronic.  Stable. Follows with cardiology Continue statin Continue blood pressure control    Atrial flutter, unspecified type [facility]) Assessment & Plan: Chronic.  Stable.  Asymptomatic. Controlled heart rate Follows with cardiology   Lumbar radiculopathy Assessment & Plan: Chronic.  Stable. Continue duloxetine 30 mg daily Continue gabapentin 300 mg 1 tablet a.m. and 2 tablets at night   Orders: -     Gabapentin; Take 1 capsule (300 mg total) by mouth at bedtime. Take 1 tablet in the morning and 2 tablets at night  Dispense: 90 capsule; Refill: 0  Need for pneumococcal vaccine -     Pneumococcal conjugate vaccine 20-valent  Overactive bladder Assessment & Plan: Chronic.  Stable.  Not currently on medications. Currently on Lasix 40 mg daily Follow-up with urology     PDMP reviewed  Return  in about 6 months (around 08/11/2022) for PCP.  Carollee Leitz, MD

## 2022-02-09 NOTE — Patient Instructions (Addendum)
It was a pleasure meeting you today. Thank you for allowing me to take part in your health care.  Our goals for today as we discussed include:  Increase gabapentin.  Take 1 tablet in the morning and 2 tablets at night.  Follow-up with cardiology as scheduled  Call dermatology to schedule appointment.  If you cannot get an appointment and needed referral please let me know [place] [facility] and [place]. [place], [place]   [phone]  You received pneumonia 20 vaccine today.  This last pneumonia vaccine that you need.  It looks like you have not received the shingles vaccine.  I recommend to dose series.  Please talk to your pharmacist about receiving this medication.  If you have received this vaccine please send [place] the dates you have received so we can update your chart.  Your mammogram is due in March 2024.  Follow-up in 6 months  If you have any questions or concerns, please do not hesitate to call the office at [phone].  I look forward to our next visit and until then take care and stay safe.  Regards,   Carollee Leitz, MD   [facility]

## 2022-02-25 ENCOUNTER — Encounter: Payer: Self-pay | Admitting: Family Medicine

## 2022-02-25 DIAGNOSIS — Z23 Encounter for immunization: Secondary | ICD-10-CM | POA: Insufficient documentation

## 2022-02-25 DIAGNOSIS — Z0000 Encounter for general adult medical examination without abnormal findings: Secondary | ICD-10-CM | POA: Insufficient documentation

## 2022-02-25 DIAGNOSIS — I4892 Unspecified atrial flutter: Secondary | ICD-10-CM | POA: Insufficient documentation

## 2022-02-25 NOTE — Assessment & Plan Note (Signed)
Chronic.  Stable. Follows with cardiology Continue statin Continue blood pressure control

## 2022-02-25 NOTE — Assessment & Plan Note (Signed)
Chronic.  Stable. Continue duloxetine 30 mg daily Continue gabapentin 300 mg 1 tablet a.m. and 2 tablets at night

## 2022-02-25 NOTE — Assessment & Plan Note (Addendum)
Chronic.  Stable.  Not currently on medications. Currently on Lasix 40 mg daily Follow-up with urology

## 2022-02-25 NOTE — Assessment & Plan Note (Signed)
History of prediabetes, hyperlipidemia, heart failure, hypertension, elevated BMI.  Could possibly benefit from GLP-1 therapy Will need to discuss with patient at next visit given limited time today.  Patient agreeable to plan.

## 2022-02-25 NOTE — Assessment & Plan Note (Addendum)
Chronic.  Stable.  Asymptomatic. Controlled heart rate Follows with cardiology

## 2022-02-25 NOTE — Assessment & Plan Note (Signed)
Colonoscopy up-to-date.  No indication for continued screening Mammogram due 08/24, previously ordered by former PCP Declined shingles Pneumonia 20 vaccine today. DEXA completed, no indication for continued monitoring Recommend RSV vaccine Follow-up 1 year for repeat annual

## 2022-03-04 ENCOUNTER — Encounter: Payer: Self-pay | Admitting: Nurse Practitioner

## 2022-03-04 ENCOUNTER — Ambulatory Visit (INDEPENDENT_AMBULATORY_CARE_PROVIDER_SITE_OTHER): Payer: Medicare Other | Admitting: Nurse Practitioner

## 2022-03-04 VITALS — BP 120/76 | HR 65 | Temp 97.6°F | Ht 67.0 in | Wt 236.2 lb

## 2022-03-04 DIAGNOSIS — R350 Frequency of micturition: Secondary | ICD-10-CM | POA: Diagnosis not present

## 2022-03-04 NOTE — Assessment & Plan Note (Signed)
Urinalysis ordered. Patient was unable to provide urine sample in the office today. Provided with urine cup and advised her to bring the specimen to the office on Monday for urinalysis.

## 2022-03-04 NOTE — Progress Notes (Signed)
Established Patient Office Visit  Subjective:  Patient ID: Monica [name], female    DOB: 1935/12/05  Age: 87 y.o. MRN: [id number]  CC:  Chief Complaint  Patient presents with   Back Pain    Low back   Urinary Frequency    Started last night 03/03/2022     HPI  Monica [name] presents for urinary frequency last night almost every 2 hours, She stated that yesterday she drank more than normal sweet tea that she usually drinks.  She has used bathroom 3 times today.   She complaint of  low back pain and states that she had bad posture while she slept yesterday.   Denies dysuria, urgency, suprapubic pain and have low back pain.   HPI   Past Medical History:  Diagnosis Date   Abnormal gait 09/22/2021   Abnormal weight gain 02/24/2021   Allergic rhinitis    Anxiety    Benign neoplasm of breast 2013   Bilateral shoulder pain 01/13/2020   Cardiac arrhythmia    Cardiomegaly    Cataract    Cervicalgia 01/13/2020   CHF (congestive heart failure) (HCC)    Chronic back pain 12/26/2018   Severe stenosis L spine       Chronic cough 04/15/2016   Chronic knee pain 07/22/2020   Chronic left shoulder pain 06/07/2019   Colon cancer screening 02/24/2021   Compulsive eating patterns    Compulsive overeating    Compulsive overeating 01/01/2015   Degenerative lumbar spinal stenosis 09/20/2017   Depression    Depression with anxiety 54/65/6812   Diastolic heart failure (HCC)    LVH with free wall thickness 1.4 cm EF 60% Dr. Tamala Julian    Diffuse cystic mastopathy 2013   Diverticulosis of large intestine without perforation or abscess without bleeding    Dysuria 06/03/2020   Edema    Enlarged clavicle 12/29/2021   Epistaxis    noted 12/20/18    Epistaxis 01/07/2019   Essential hypertension 12/21/2006   Generalized abdominal pain 01/03/2019   GI bleeding    in ~2018    Gout    Grief 01/13/2020   Hiatal hernia    HTN (hypertension)    Hyperlipidemia    Intertrigo 10/01/2020    Iron deficiency anemia 01/03/2019   Kidney cysts 01/13/2020   Leg DVT (deep venous thromboembolism), acute, bilateral ([place]) 12/26/2018   Noted [place] legs 09/02/18 b/l       Lower GI bleed 10/03/2016   Lump or mass in breast 2012   Memory loss 03/06/2019   Middle cerebral artery embolism, 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   OA (osteoarthritis) of knee    with injections   Obesity    OSA (obstructive sleep apnea)    not on cpap   Other abnormal glucose    Partial nontraumatic rupture of right rotator cuff 09/22/2015   Pelvic floor dysfunction 06/06/2019   Peptic stricture of esophagus    Personal history of tobacco use, presenting hazards to health    Rectal bleeding 02/24/2021   Recurrent falls 01/13/2020   Sciatica    Seborrheic keratoses 04/15/2016   Sinus infection 2010   Spinal stenosis    lumbar    Status post bilateral knee replacements 05/04/2016   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   09/01/2018   Established GNA with thrombectomy intracranial in hospital    Stroke [facility])    Unspecified sleep apnea    Urge incontinence of urine 09/09/2011   Urine frequency 02/17/2021   UTI (urinary tract  infection)    e coli 06/2020    Past Surgical History:  Procedure Laterality Date   adenosine cardiolite  1/08   low risk    APPENDECTOMY     BREAST BIOPSY     02/28/11 negative   BREAST LUMPECTOMY     right x1 ('89) left x2 ('70s, '90)   CARDIAC CATHETERIZATION  2001   normal per pt   COLONOSCOPY  11/02   diverticulosis   COLONOSCOPY  9/06   diverticulosis, hemorhoids   COLONOSCOPY N/A 10/01/2016   Procedure: COLONOSCOPY;  Surgeon: Gatha Mayer, MD;  Location: [facility];  Service: Endoscopy;  Laterality: N/A;   dexa  3/02   normal   EYE SURGERY  2012   cataract   IR CT HEAD LTD  09/01/2018   IR PERCUTANEOUS ART THROMBECTOMY/INFUSION INTRACRANIAL INC DIAG ANGIO  09/01/2018   JOINT REPLACEMENT     knee b/l 2011   knee replacement Right 9/11   R. Dr. Telford Nab   RADIOLOGY WITH ANESTHESIA  N/A 09/01/2018   Procedure: IR WITH ANESTHESIA;  Surgeon: Luanne Bras, MD;  Location: [place];  Service: Radiology;  Laterality: N/A;   sleep study  5/05   TONSILLECTOMY     TOTAL ABDOMINAL HYSTERECTOMY     fibroids, age of 25   TOTAL KNEE ARTHROPLASTY Left 2012   US TRANSVAGINAL PELVIC MODIFIED  2000, 2001    Family History  Problem Relation Age of Onset   Hypertension Mother        (a lot of animosity in their relationship)   Heart failure Mother    Stroke Father    Breast cancer Sister    Cancer Sister        breast cancer   Prostate cancer Brother    Cancer Brother        prostate    Hepatitis Brother        Hep C; alcoholism (terminal)   Drug abuse Brother        died 39   Other Brother        drug addiction   Gout Other        whole family     Social History   Socioeconomic History   Marital status: Widowed    Spouse name: Not on file   Number of children: 1   Years of education: Not on file   Highest education level: Not on file  Occupational History   Occupation: Retired school principal    Employer: RETIRED  Tobacco Use   Smoking status: Former    Packs/day: 0.50    Years: 20.00    Total pack years: 10.00    Types: Cigarettes   Smokeless tobacco: Never   Tobacco comments:    quit approx. 20 years ago  Vaping Use   Vaping Use: Never used  Substance and Sexual Activity   Alcohol use: Yes    Alcohol/week: 0.0 standard drinks of alcohol    Comment: wine occasional   Drug use: No   Sexual activity: Never  Other Topics Concern   Not on file  Social History Narrative   Widowed x 30+ years as of 12/2018    1 daughter. Retired principal. 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    Social Determinants of Radio broadcast assistant Strain: [place]  (02/08/2022)   Overall Financial Resource Strain (CARDIA)    Difficulty of Paying Living Expenses: Not hard  at all  Food Insecurity: No Food Insecurity  (02/08/2022)   Hunger Vital Sign    Worried About Running Out of Food in the Last Year: Never true    Ran Out of Food in the Last Year: Never true  Transportation Needs: No Transportation Needs (02/08/2022)   PRAPARE - Hydrologist (Medical): No    Lack of Transportation (Non-Medical): No  Physical Activity: Not on file  Stress: No Stress Concern Present (04/01/2020)   [place]    Feeling of Stress : Not at all  Social Connections: Unknown (02/08/2022)   Social Connection and Isolation Panel [NHANES]    Frequency of Communication with Friends and Family: Three times a week    Frequency of Social Gatherings with Friends and Family: Three times a week    Attends Religious Services: More than 4 times per year    Active Member of Clubs or Organizations: Not on file    Attends Club or Organization Meetings: Not on file    Marital Status: Not on file  Intimate Partner Violence: Not At Risk (02/08/2022)   Humiliation, Afraid, Rape, and Kick questionnaire    Fear of Current or Ex-Partner: No    Emotionally Abused: No    Physically Abused: No    Sexually Abused: No     Outpatient Medications Prior to Visit  Medication Sig Dispense Refill   allopurinol (ZYLOPRIM) 100 MG tablet TAKE 2 TABLETS BY MOUTH EVERY DAY 180 tablet 3   ALPRAZolam (XANAX) 0.25 MG tablet Take 1 tablet (0.25 mg total) by mouth 2 (two) times daily as needed for anxiety. 60 tablet 5   amLODipine (NORVASC) 5 MG tablet Take 1 tablet (5 mg total) by mouth daily. Take additional 5 mg in pm if BP>130/>80 180 tablet 3   digoxin (LANOXIN) 0.125 MG tablet TAKE 0.5 TABLETS (0.0625 MG TOTAL) BY MOUTH DAILY. 45 tablet 3   DULoxetine (CYMBALTA) 30 MG capsule TAKE 1 CAPSULE BY MOUTH EVERY DAY 90 capsule 3   ELIQUIS 5 MG TABS tablet TAKE 1 TABLET BY MOUTH TWICE A DAY 180 tablet 2   furosemide (LASIX) 40 MG tablet Take 1 tablet (40 mg total) by  mouth daily. 90 tablet 3   gabapentin (NEURONTIN) 300 MG capsule Take 1 capsule (300 mg total) by mouth at bedtime. Take 1 tablet in the morning and 2 tablets at night 90 capsule 0   hydrocortisone 2.5 % cream Apply topically 2 (two) times daily. (Patient taking differently: Apply topically 2 (two) times daily. As needed) 30 g 2   Magnesium 250 MG TABS Take 250 mg by mouth daily.     metoprolol succinate (TOPROL-XL) 100 MG 24 hr tablet Take with or immediately following a meal. 90 tablet 3   nystatin cream (MYCOSTATIN) Apply 1 Application topically 3 (three) times daily. Prn right back 30 g 11   potassium chloride SA (KLOR-CON M20) 20 MEQ tablet Take 1 tablet (20 mEq total) by mouth daily. TAKE TABLET BY MOUTH AS DIRECTED 90 tablet 3   atorvastatin (LIPITOR) 20 MG tablet TAKE 1 TABLET (20 MG TOTAL) BY MOUTH DAILY. AT NIGHT. STOP/DISCONTINUE LOVASTATIN (Patient not taking: Reported on 03/04/2022) 90 tablet 3   Cholecalciferol (VITAMIN D3 PO) Take by mouth. (Patient not taking: Reported on 03/04/2022)     No facility-administered medications prior to visit.    No Known Allergies  ROS Review of Systems  Constitutional: Negative.   HENT:  Negative.    Respiratory: Negative.    Cardiovascular: Negative.   Genitourinary:  Positive for frequency. Negative for dysuria, pelvic pain and urgency.  Musculoskeletal:  Positive for back pain.  Neurological: Negative.   Psychiatric/Behavioral: Negative.        Objective:    Physical Exam Constitutional:      Appearance: Normal appearance. She is obese.  Cardiovascular:     Rate and Rhythm: Normal rate and regular rhythm.     Pulses: Normal pulses.     Heart sounds: Normal heart sounds.  Pulmonary:     Effort: Pulmonary effort is normal.     Breath sounds: Normal breath sounds.  Abdominal:     General: Bowel sounds are normal.     Palpations: Abdomen is soft. There is no mass.     Tenderness: There is no abdominal tenderness. There is no right  CVA tenderness, left CVA tenderness or rebound.  Neurological:     General: No focal deficit present.     Mental Status: She is alert and oriented to person, place, and time.  Psychiatric:        Mood and Affect: Mood normal.        Behavior: Behavior normal.        Thought Content: Thought content normal.        Judgment: Judgment normal.     BP 120/76   Pulse 65   Temp 97.6 F (36.4 C) (Oral)   Ht '5\' 7"'$  (1.702 m)   Wt 236 lb 3.2 oz (107.1 kg)   SpO2 98%   BMI 36.99 kg/m  Wt Readings from Last 3 Encounters:  03/04/22 236 lb 3.2 oz (107.1 kg)  02/09/22 237 lb (107.5 kg)  02/08/22 239 lb (108.4 kg)     Health Maintenance  Topic Date Due   COVID-19 Vaccine (5 - 2023-24 season) 10/22/2021   Zoster Vaccines- Shingrix (1 of 2) 04/24/2022 (Originally 08/27/1954)   MAMMOGRAM  06/17/2022   Medicare Annual Wellness (AWV)  02/09/2023   Pneumonia Vaccine 5+ Years old  Completed   INFLUENZA VACCINE  Completed   DEXA SCAN  Completed   HPV VACCINES  Aged Out   DTaP/Tdap/Td  Discontinued    There are no preventive care reminders to display for this patient.  Lab Results  Component Value Date   TSH 2.37 12/29/2021   Lab Results  Component Value Date   WBC 5.9 12/29/2021   HGB 13.2 12/29/2021   HCT 41.9 12/29/2021   MCV 88.8 12/29/2021   PLT 232.0 12/29/2021   Lab Results  Component Value Date   NA 141 12/29/2021   K 4.0 12/29/2021   CO2 30 12/29/2021   GLUCOSE 103 (H) 12/29/2021   BUN 15 12/29/2021   CREATININE 0.92 12/29/2021   BILITOT 0.7 12/29/2021   ALKPHOS 117 12/29/2021   AST 17 12/29/2021   ALT 14 12/29/2021   PROT 6.8 12/29/2021   ALBUMIN 4.1 12/29/2021   CALCIUM 9.4 12/29/2021   ANIONGAP 9 09/05/2018   GFR 57.25 (L) 12/29/2021   Lab Results  Component Value Date   CHOL 115 09/03/2021   Lab Results  Component Value Date   HDL 37.80 (L) 09/03/2021   Lab Results  Component Value Date   LDLCALC 50 09/03/2021   Lab Results  Component Value Date    TRIG 137.0 09/03/2021   Lab Results  Component Value Date   CHOLHDL 3 09/03/2021   Lab Results  Component Value Date   HGBA1C  6.0 (A) 12/29/2021      Assessment & Plan:   Problem List Items Addressed This Visit       Other   Urinary frequency - Primary    Urinalysis ordered. Patient was unable to provide urine sample in the office today. Provided with urine cup and advised her to bring the specimen to the office on Monday for urinalysis.       Relevant Orders   POCT Urinalysis Dipstick (Automated)     No orders of the defined types were placed in this encounter.    Follow-up: No follow-ups on file.    Theresia Lo, NP

## 2022-03-04 NOTE — Patient Instructions (Addendum)
Patient tried to use the rest room three times while in the office but was not able to urinate. Patient has been  provided with a urine cup and hat. Advised patient bring the specimen to the office on Monday for urinalysis but the specimen should not be collected before Sunday and should be kept in the refrigerator.

## 2022-03-07 LAB — POC URINALSYSI DIPSTICK (AUTOMATED)
Bilirubin, UA: NEGATIVE
Blood, UA: NEGATIVE
Glucose, UA: NEGATIVE
Ketones, UA: NEGATIVE
Leukocytes, UA: NEGATIVE
Nitrite, UA: NEGATIVE
Protein, UA: NEGATIVE
Spec Grav, UA: 1.02 (ref 1.010–1.025)
Urobilinogen, UA: 0.2 E.U./dL
pH, UA: 7 (ref 5.0–8.0)

## 2022-03-08 ENCOUNTER — Telehealth: Payer: Self-pay

## 2022-03-08 NOTE — Progress Notes (Signed)
Urinalysis is WNL Please call the pt.

## 2022-03-08 NOTE — Telephone Encounter (Signed)
Left message for Patient to call the office.

## 2022-03-09 ENCOUNTER — Telehealth: Payer: Self-pay

## 2022-03-09 NOTE — Telephone Encounter (Signed)
Left Patient a message to call back for lab results

## 2022-03-10 ENCOUNTER — Telehealth: Payer: Self-pay

## 2022-03-10 NOTE — Telephone Encounter (Signed)
Left message to call back to let her know that her Urinalysis is normal

## 2022-03-11 NOTE — Telephone Encounter (Signed)
Pt called back returning elizabeth pc. I read the note below to her. She's aware.

## 2022-03-17 ENCOUNTER — Other Ambulatory Visit: Payer: Self-pay | Admitting: Family Medicine

## 2022-03-17 DIAGNOSIS — M5416 Radiculopathy, lumbar region: Secondary | ICD-10-CM

## 2022-03-17 MED ORDER — GABAPENTIN 300 MG PO CAPS: 300.0000 mg | ORAL_CAPSULE | Freq: Every day | ORAL | 3 refills | Status: DC

## 2022-03-17 NOTE — Telephone Encounter (Signed)
Prescription Request  03/17/2022  Is this a "Controlled Substance" medicine? No  LOV: 02/09/2022  What is the name of the medication or equipment? gabapentin (NEURONTIN) 300 MG capsule  Have you contacted your pharmacy to request a refill? Yes   Which pharmacy would you like this sent to?   [facility] [address].      Patient notified that their request is being sent to the clinical staff for review and that they should receive a response within 2 business days.   Please advise at [place]

## 2022-03-17 NOTE — Telephone Encounter (Signed)
Last filled on 02/09/2022 for #90 with no refills

## 2022-03-18 ENCOUNTER — Other Ambulatory Visit: Payer: Self-pay

## 2022-03-18 ENCOUNTER — Telehealth: Payer: Self-pay | Admitting: Family Medicine

## 2022-03-18 DIAGNOSIS — M5416 Radiculopathy, lumbar region: Secondary | ICD-10-CM

## 2022-03-18 MED ORDER — GABAPENTIN 300 MG PO CAPS: ORAL_CAPSULE | ORAL | 3 refills | Status: DC

## 2022-03-18 NOTE — Telephone Encounter (Signed)
Pt [facility] a [facility]ill on [facility] and she need a new script that says she is taking one in the am and two at night

## 2022-03-18 NOTE — Telephone Encounter (Signed)
sent 

## 2022-03-22 ENCOUNTER — Encounter: Payer: Self-pay | Admitting: Cardiovascular Disease

## 2022-03-22 ENCOUNTER — Ambulatory Visit: Payer: Medicare Other | Attending: Cardiovascular Disease | Admitting: Cardiovascular Disease

## 2022-03-22 VITALS — BP 120/78 | HR 71 | Ht 67.0 in | Wt 238.4 lb

## 2022-03-22 DIAGNOSIS — I10 Essential (primary) hypertension: Secondary | ICD-10-CM

## 2022-03-22 DIAGNOSIS — I4892 Unspecified atrial flutter: Secondary | ICD-10-CM | POA: Diagnosis not present

## 2022-03-22 DIAGNOSIS — I5032 Chronic diastolic (congestive) heart failure: Secondary | ICD-10-CM

## 2022-03-22 DIAGNOSIS — Z86718 Personal history of other venous thrombosis and embolism: Secondary | ICD-10-CM | POA: Diagnosis not present

## 2022-03-22 MED ORDER — DIGOXIN 125 MCG PO TABS: 0.0625 mg | ORAL_TABLET | Freq: Every day | ORAL | 3 refills | Status: DC

## 2022-03-22 NOTE — Progress Notes (Signed)
Cardiology Office Note   Date:  03/22/2022   ID:  Monica [name], DOB 09-20-1935, MRN [id number]  PCP:  Carollee Leitz, MD  Cardiologist:   Kathlyn Sacramento, MD   Chief Complaint  Patient presents with   Other    9-12 Month f/u c/o fatigue, sob and left edema. Meds reviewed verbally with pt.      History of Present Illness: Monica [name] is a 87 y.o. female who presents to establish cardiovascular care with me.  She used to be followed by Dr. Tamala Julian.  She has known history of atrial flutter, chronic diastolic heart failure, previous embolic CVA, sleep apnea, coronary artery calcifications, bilateral DVT and essential hypertension She was hospitalized in 2020 with left MCA stroke and was treated with catheter-based intervention.  She was found to have bilateral DVT and was anticoagulated with Eliquis.  She developed atrial flutter that converted to sinus rhythm with amiodarone. Amiodarone was subsequently discontinued.  She was noted to be in atrial flutter in 2022 with intermittent tachycardia.  Small dose digoxin was added at that time.  She has been doing well with no chest pain or palpitations.  She reports chronic exertional dyspnea.  She is noted to be in sinus rhythm today.   Past Medical History:  Diagnosis Date   Abnormal gait 09/22/2021   Abnormal weight gain 02/24/2021   Allergic rhinitis    Anxiety    Benign neoplasm of breast 2013   Bilateral shoulder pain 01/13/2020   Cardiac arrhythmia    Cardiomegaly    Cataract    Cervicalgia 01/13/2020   CHF (congestive heart failure) (HCC)    Chronic back pain 12/26/2018   Severe stenosis L spine       Chronic cough 04/15/2016   Chronic knee pain 07/22/2020   Chronic left shoulder pain 06/07/2019   Colon cancer screening 02/24/2021   Compulsive eating patterns    Compulsive overeating    Compulsive overeating 01/01/2015   Degenerative lumbar spinal stenosis 09/20/2017   Depression    Depression with anxiety  44/96/7591   Diastolic heart failure (HCC)    LVH with free wall thickness 1.4 cm EF 60% Dr. Tamala Julian    Diffuse cystic mastopathy 2013   Diverticulosis of large intestine without perforation or abscess without bleeding    Dysuria 06/03/2020   Edema    Enlarged clavicle 12/29/2021   Epistaxis    noted 12/20/18    Epistaxis 01/07/2019   Essential hypertension 12/21/2006   Generalized abdominal pain 01/03/2019   GI bleeding    in ~2018    Gout    Grief 01/13/2020   Hiatal hernia    HTN (hypertension)    Hyperlipidemia    Intertrigo 10/01/2020   Iron deficiency anemia 01/03/2019   Kidney cysts 01/13/2020   Leg DVT (deep venous thromboembolism), acute, bilateral ([place]) 12/26/2018   Noted [place] legs 09/02/18 b/l       Lower GI bleed 10/03/2016   Lump or mass in breast 2012   Memory loss 03/06/2019   Middle cerebral artery embolism, 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   OA (osteoarthritis) of knee    with injections   Obesity    OSA (obstructive sleep apnea)    not on cpap   Other abnormal glucose    Partial nontraumatic rupture of right rotator cuff 09/22/2015   Pelvic floor dysfunction 06/06/2019   Peptic stricture of esophagus    Personal history of tobacco use, presenting hazards to health    Rectal  bleeding 02/24/2021   Recurrent falls 01/13/2020   Sciatica    Seborrheic keratoses 04/15/2016   Sinus infection 2010   Spinal stenosis    lumbar    Status post bilateral knee replacements 05/04/2016   Stroke (cerebrum) ([place]) 09/01/2018   09/01/2018   Established GNA with thrombectomy intracranial in hospital    Stroke [facility])    Unspecified sleep apnea    Urge incontinence of urine 09/09/2011   Urine frequency 02/17/2021   UTI (urinary tract infection)    e coli 06/2020    Past Surgical History:  Procedure Laterality Date   adenosine cardiolite  1/08   low risk    APPENDECTOMY     BREAST BIOPSY     02/28/11 negative   BREAST LUMPECTOMY     right x1 ('89) left x2 ('70s, '90)   CARDIAC  CATHETERIZATION  2001   normal per pt   COLONOSCOPY  11/02   diverticulosis   COLONOSCOPY  9/06   diverticulosis, hemorhoids   COLONOSCOPY N/A 10/01/2016   Procedure: COLONOSCOPY;  Surgeon: Gatha Mayer, MD;  Location: [facility];  Service: Endoscopy;  Laterality: N/A;   dexa  3/02   normal   EYE SURGERY  2012   cataract   IR CT HEAD LTD  09/01/2018   IR PERCUTANEOUS ART THROMBECTOMY/INFUSION INTRACRANIAL INC DIAG ANGIO  09/01/2018   JOINT REPLACEMENT     knee b/l 2011   knee replacement Right 9/11   R. Dr. Telford Nab   RADIOLOGY WITH ANESTHESIA N/A 09/01/2018   Procedure: IR WITH ANESTHESIA;  Surgeon: Luanne Bras, MD;  Location: [facility];  Service: Radiology;  Laterality: N/A;   sleep study  5/05   TONSILLECTOMY     TOTAL ABDOMINAL HYSTERECTOMY     fibroids, age of 89   TOTAL KNEE ARTHROPLASTY Left 2012   US TRANSVAGINAL PELVIC MODIFIED  2000, 2001     Current Outpatient Medications  Medication Sig Dispense Refill   allopurinol (ZYLOPRIM) 100 MG tablet TAKE 2 TABLETS BY MOUTH EVERY DAY 180 tablet 3   ALPRAZolam (XANAX) 0.25 MG tablet Take 1 tablet (0.25 mg total) by mouth 2 (two) times daily as needed for anxiety. 60 tablet 5   amLODipine (NORVASC) 5 MG tablet Take 1 tablet (5 mg total) by mouth daily. Take additional 5 mg in pm if BP>130/>80 180 tablet 3   digoxin (LANOXIN) 0.125 MG tablet TAKE 0.5 TABLETS (0.0625 MG TOTAL) BY MOUTH DAILY. 45 tablet 3   DULoxetine (CYMBALTA) 30 MG capsule TAKE 1 CAPSULE BY MOUTH EVERY DAY 90 capsule 3   ELIQUIS 5 MG TABS tablet TAKE 1 TABLET BY MOUTH TWICE A DAY 180 tablet 2   furosemide (LASIX) 40 MG tablet Take 1 tablet (40 mg total) by mouth daily. 90 tablet 3   gabapentin (NEURONTIN) 300 MG capsule Take 1 tablet in the morning and 2 tablets at night 90 capsule 3   hydrocortisone 2.5 % cream Apply topically 2 (two) times daily. (Patient taking differently: Apply topically 2 (two) times daily. As needed) 30 g 2   Magnesium 250 MG TABS  Take 250 mg by mouth daily.     metoprolol succinate (TOPROL-XL) 100 MG 24 hr tablet Take with or immediately following a meal. 90 tablet 3   nystatin cream (MYCOSTATIN) Apply 1 Application topically 3 (three) times daily. Prn right back 30 g 11   potassium chloride SA (KLOR-CON M20) 20 MEQ tablet Take 1 tablet (20 mEq total) by mouth daily. TAKE  TABLET BY MOUTH AS DIRECTED 90 tablet 3   No current facility-administered medications for this visit.    Allergies:   Patient has no known allergies.    Social History:  The patient  reports that she has quit smoking. Her smoking use included cigarettes. She has a 10.00 pack-year smoking history. She has never used smokeless tobacco. She reports current alcohol use. She reports that she does not use drugs.   Family History:  The patient's family history includes Breast cancer in her sister; Cancer in her brother and sister; Drug abuse in her brother; Gout in an other family member; Heart failure in her mother; Hepatitis in her brother; Hypertension in her mother; Other in her brother; Prostate cancer in her brother; Stroke in her father.    ROS:  Please see the history of present illness.   Otherwise, review of systems are positive for none.   All other systems are reviewed and negative.    PHYSICAL EXAM: VS:  BP 120/78 (BP Location: Left Arm, Patient Position: Sitting, Cuff Size: Large)   Pulse 71   Ht '5\' 7"'$  (1.702 m)   Wt 238 lb 6 oz (108.1 kg)   SpO2 98%   BMI 37.33 kg/m  , BMI Body mass index is 37.33 kg/m. GEN: Well nourished, well developed, in no acute distress  HEENT: normal  Neck: no JVD, carotid bruits, or masses Cardiac: RRR; no  rubs, or gallops, 1 out of 6 systolic murmur in the aortic area.  Trace bilateral leg edema worse on the left side. Respiratory:  clear to auscultation bilaterally, normal work of breathing GI: soft, nontender, nondistended, + BS MS: no deformity or atrophy  Skin: warm and dry, no rash Neuro:   Strength and sensation are intact Psych: euthymic mood, full affect   EKG:  EKG is ordered today. The ekg ordered today demonstrates normal sinus rhythm with minimal LVH.  Heart rate is 71 bpm.   Recent Labs: 12/29/2021: ALT 14; BUN 15; Creatinine, Ser 0.92; Hemoglobin 13.2; Platelets 232.0; Potassium 4.0; Sodium 141; TSH 2.37    Lipid Panel    Component Value Date/Time   CHOL 115 09/03/2021 1215   TRIG 137.0 09/03/2021 1215   HDL 37.80 (L) 09/03/2021 1215   CHOLHDL 3 09/03/2021 1215   VLDL 27.4 09/03/2021 1215   LDLCALC 50 09/03/2021 1215   LDLDIRECT 131.8 12/09/2009 1643      Wt Readings from Last 3 Encounters:  03/22/22 238 lb 6 oz (108.1 kg)  03/04/22 236 lb 3.2 oz (107.1 kg)  02/09/22 237 lb (107.5 kg)          No data to display            ASSESSMENT AND PLAN:  1.  Paroxysmal atrial flutter: She is in sinus rhythm today.  Continue treatment with Toprol.  Given previous extensive DVT, the plan is to keep her on long-term anticoagulation and thus there is no added benefit of atrial flutter ablation. If she remains in sinus rhythm upon follow-up, I will consider stopping digoxin.  2.  History of bilateral DVTs: Continue long-term anticoagulation with Eliquis.  I reviewed most recent labs done in November which showed normal renal function and CBC.  3.  Essential hypertension: Blood pressure is controlled on current medications.  4.  Chronic diastolic heart failure: She does not have to use Lasix every day.    Disposition:   FU with me in 6 months  Signed,  Kathlyn Sacramento, MD  03/22/2022  1:59 PM    [facility]

## 2022-03-22 NOTE — Patient Instructions (Signed)
Medication Instructions:  No changes *If you need a refill on your cardiac medications before your next appointment, please call your pharmacy*   Lab Work: None ordered If you have labs (blood work) drawn today and your tests are completely normal, you will receive your results only by: [facility] (if you have MyChart) OR A paper copy in the mail If you have any lab test that is abnormal or we need to change your treatment, we will call you to review the results.   Testing/Procedures: None ordered   Follow-Up: At [facility], you and your health needs are our priority.  As part of our continuing mission to provide you with exceptional heart care, we have created designated Provider Care Teams.  These Care Teams include your primary Cardiologist (physician) and Advanced Practice Providers (APPs -  Physician Assistants and Nurse Practitioners) who all work together to provide you with the care you need, when you need it.  We recommend signing up for the patient portal called "MyChart".  Sign up information is provided on this After Visit Summary.  MyChart is used to connect with patients for Virtual Visits (Telemedicine).  Patients are able to view lab/test results, encounter notes, upcoming appointments, etc.  Non-urgent messages can be sent to your provider as well.   To learn more about what you can do with MyChart, go to [email or web address].    Your next appointment:   6 month(s)  Provider:   Dr. Fletcher Anon

## 2022-04-18 ENCOUNTER — Telehealth: Payer: Self-pay

## 2022-04-18 ENCOUNTER — Telehealth: Payer: Self-pay | Admitting: Family Medicine

## 2022-04-18 ENCOUNTER — Other Ambulatory Visit: Payer: Self-pay

## 2022-04-18 DIAGNOSIS — G8929 Other chronic pain: Secondary | ICD-10-CM

## 2022-04-18 NOTE — Telephone Encounter (Signed)
Pt called stating her right arm has been aching for a week. Sent to access nurse

## 2022-04-18 NOTE — Telephone Encounter (Signed)
error 

## 2022-04-18 NOTE — Telephone Encounter (Signed)
We received a fax from Access Nurse.  Access Nurse tried to reach patient by phone three times unsuccessfully.  I called patient and patient states her neighbors took her to Urgent Care today, but the wait was going to be a couple of hours and she did not want to ask them to wait that long.  Patient states her right arm hurts and she can't lift it.  Patient states she can't sleep on her right side because of her arm and the pain is excruciating when she is trying to turn over.  Patient states her arm may be a little swollen, but it is not red or hot to the touch.  Patient the inner part of her arm feels tender, like the flesh is swollen.  I offered patient an appointment for tomorrow.  Patient states she does not want to schedule without talking to her neighbors since they will be taking her to any appointment she has.  Patient states she will talk with her neighbors and call us back.

## 2022-04-19 ENCOUNTER — Encounter: Payer: Self-pay | Admitting: Internal Medicine

## 2022-04-19 ENCOUNTER — Telehealth: Payer: Self-pay

## 2022-04-19 ENCOUNTER — Other Ambulatory Visit: Payer: Self-pay | Admitting: Family Medicine

## 2022-04-19 ENCOUNTER — Ambulatory Visit: Payer: Medicare Other | Admitting: Nurse Practitioner

## 2022-04-19 ENCOUNTER — Ambulatory Visit (INDEPENDENT_AMBULATORY_CARE_PROVIDER_SITE_OTHER): Payer: Medicare Other | Admitting: Internal Medicine

## 2022-04-19 DIAGNOSIS — E1159 Type 2 diabetes mellitus with other circulatory complications: Secondary | ICD-10-CM

## 2022-04-19 DIAGNOSIS — M25511 Pain in right shoulder: Secondary | ICD-10-CM | POA: Diagnosis not present

## 2022-04-19 DIAGNOSIS — E1169 Type 2 diabetes mellitus with other specified complication: Secondary | ICD-10-CM

## 2022-04-19 DIAGNOSIS — I4892 Unspecified atrial flutter: Secondary | ICD-10-CM | POA: Diagnosis not present

## 2022-04-19 DIAGNOSIS — G4733 Obstructive sleep apnea (adult) (pediatric): Secondary | ICD-10-CM

## 2022-04-19 DIAGNOSIS — I714 Abdominal aortic aneurysm, without rupture, unspecified: Secondary | ICD-10-CM

## 2022-04-19 DIAGNOSIS — I110 Hypertensive heart disease with heart failure: Secondary | ICD-10-CM | POA: Diagnosis not present

## 2022-04-19 DIAGNOSIS — E669 Obesity, unspecified: Secondary | ICD-10-CM

## 2022-04-19 DIAGNOSIS — R202 Paresthesia of skin: Secondary | ICD-10-CM | POA: Diagnosis not present

## 2022-04-19 DIAGNOSIS — I152 Hypertension secondary to endocrine disorders: Secondary | ICD-10-CM | POA: Diagnosis not present

## 2022-04-19 DIAGNOSIS — R200 Anesthesia of skin: Secondary | ICD-10-CM

## 2022-04-19 LAB — HEMOGLOBIN A1C: Hgb A1c MFr Bld: 6.4 % (ref 4.6–6.5)

## 2022-04-19 LAB — CBC WITH DIFFERENTIAL/PLATELET
Basophils Absolute: 0 10*3/uL (ref 0.0–0.1)
Basophils Relative: 0.8 % (ref 0.0–3.0)
Eosinophils Absolute: 0.2 10*3/uL (ref 0.0–0.7)
Eosinophils Relative: 2.9 % (ref 0.0–5.0)
HCT: 39.4 % (ref 36.0–46.0)
Hemoglobin: 12.8 g/dL (ref 12.0–15.0)
Lymphocytes Relative: 28.7 % (ref 12.0–46.0)
Lymphs Abs: 1.7 10*3/uL (ref 0.7–4.0)
MCHC: 32.5 g/dL (ref 30.0–36.0)
MCV: 87.7 fl (ref 78.0–100.0)
Monocytes Absolute: 0.5 10*3/uL (ref 0.1–1.0)
Monocytes Relative: 8.5 % (ref 3.0–12.0)
Neutro Abs: 3.4 10*3/uL (ref 1.4–7.7)
Neutrophils Relative %: 59.1 % (ref 43.0–77.0)
Platelets: 253 10*3/uL (ref 150.0–400.0)
RBC: 4.49 Mil/uL (ref 3.87–5.11)
RDW: 16.6 % — ABNORMAL HIGH (ref 11.5–15.5)
WBC: 5.8 10*3/uL (ref 4.0–10.5)

## 2022-04-19 LAB — MICROALBUMIN / CREATININE URINE RATIO
Creatinine,U: 237.6 mg/dL
Microalb Creat Ratio: 0.7 mg/g (ref 0.0–30.0)
Microalb, Ur: 1.6 mg/dL (ref 0.0–1.9)

## 2022-04-19 LAB — B12 AND FOLATE PANEL
Folate: 8.8 ng/mL (ref >5.9)
Vitamin B-12: 271 pg/mL (ref 211–911)

## 2022-04-19 LAB — TSH: TSH: 2.19 u[IU]/mL (ref 0.35–5.50)

## 2022-04-19 MED ORDER — DULOXETINE HCL 30 MG PO CPEP: ORAL_CAPSULE | ORAL | 3 refills | Status: DC

## 2022-04-19 NOTE — Patient Instructions (Addendum)
  You can starting  2000  to 3000 mg of acetominophen (tylenol) every day safely  In divided doses (500 mg every 6 hours  Or 1000 mg every 12 hours.) FOR YOUR SHOULDER PAIN    I am making a referral to DR [name] at [facility] for your shoulder    Your nose is bleeding because it is TOO DRY.  Please start using a saline nasal spray twice daily to keep it moisturized  You DO have diabetes .  It may be causing  NUMBNESS OF YOUR FEET,  BUT I AM CHECKING FOR OTHER CAUSES

## 2022-04-19 NOTE — Assessment & Plan Note (Addendum)
Diagnosed in 2017  but Untreated due to patient preference. Diagnosis confirmed with recent sleep study. Reviewed the long term complications of untreated OSA

## 2022-04-19 NOTE — Telephone Encounter (Signed)
Patient was seen today by Dr. Derrel Nip.  Williette Loewe,cma

## 2022-04-19 NOTE — Progress Notes (Signed)
Subjective:  Patient ID: Monica [name], female    DOB: Jul 20, 1935  Age: 87 y.o. MRN: [id number]  CC: The primary encounter diagnosis was Morbid (severe) obesity due to excess calories ([place]). Diagnoses of Obstructive sleep apnea, Numbness and tingling of both feet, Obesity, diabetes, and hypertension syndrome ([place]), Abdominal aortic aneurysm (AAA) without rupture, unspecified part ([place]), Atrial flutter, unspecified type ([place]), Right anterior shoulder pain, and Hypertensive heart disease with heart failure ([place]) were also pertinent to this visit.   HPI Monica [name] presents for multiple issues  Chief Complaint  Patient presents with   Arm Pain    Right arm pain   87 yr old female with morbid obsity (untreated OSA, HTN , knee OA>  prediabetes  hypertensive heart disease and AAA, presents with right arm pain of uncertain chronicity ad feelisng of off balance/;dizziness accompanied by sinus congestion and  blurred vision  HTN: Patient is taking her medications as prescribed and notes no adverse effects.  Home BP readings have been done about once per week and are  generally < 130/80 .  She is avoiding added salt in her diet and walking regularly about 3 times per week for exercise  . using thigh cuff t measure 124 /66  Right arm pain is chronic   worse with bad weather.  Had PT for right arm and left knee several months ago, but pain became excruciating for the past 1-2 weeks . Tried using Icy pain packs and some other OTC balms which helped somewhat .  H pain radiates to upper arm.   Has seen [facility] , had shoulder x rayed  but did not return.  Wants to see [name].   HAS PAIN WITH PASSIVE ABDUCTION OF RIGHT SHOULDER TO THE HORIZONTAL LINE.  No recent falls,  but slid out of chair and grabbed a rung on the stair well to help pull herself back up recently. ook a hydrocodone several days ago [rescribed in July by Karlyn Agee, MD)   2) Dizziness and blurred vision for the last several  days,  started on Saturday . No vertigo . just off balance with position  become more unsure of her steps.   Had a CVA 3 years ago.    3) Chronic sinus congestion .  Had two episodes of nose bleeding ,  takes eliquis.  Nose alternates between dry and running.  Not using ayr   Outpatient Medications Prior to Visit  Medication Sig Dispense Refill   allopurinol (ZYLOPRIM) 100 MG tablet TAKE 2 TABLETS BY MOUTH EVERY DAY 180 tablet 3   ALPRAZolam (XANAX) 0.25 MG tablet Take 1 tablet (0.25 mg total) by mouth 2 (two) times daily as needed for anxiety. 60 tablet 5   amLODipine (NORVASC) 5 MG tablet Take 1 tablet (5 mg total) by mouth daily. Take additional 5 mg in pm if BP>130/>80 180 tablet 3   digoxin (LANOXIN) 0.125 MG tablet Take 0.5 tablets (0.0625 mg total) by mouth daily. 45 tablet 3   DULoxetine (CYMBALTA) 30 MG capsule TAKE 1 CAPSULE BY MOUTH EVERY DAY 90 capsule 3   ELIQUIS 5 MG TABS tablet TAKE 1 TABLET BY MOUTH TWICE A DAY 180 tablet 2   furosemide (LASIX) 40 MG tablet Take 1 tablet (40 mg total) by mouth daily. 90 tablet 3   gabapentin (NEURONTIN) 300 MG capsule Take 1 tablet in the morning and 2 tablets at night 90 capsule 3   hydrocortisone 2.5 % cream Apply topically 2 (two)  times daily. (Patient taking differently: Apply topically 2 (two) times daily. As needed) 30 g 2   Magnesium 250 MG TABS Take 250 mg by mouth daily.     metoprolol succinate (TOPROL-XL) 100 MG 24 hr tablet Take with or immediately following a meal. 90 tablet 3   nystatin cream (MYCOSTATIN) Apply 1 Application topically 3 (three) times daily. Prn right back 30 g 11   potassium chloride SA (KLOR-CON M20) 20 MEQ tablet Take 1 tablet (20 mEq total) by mouth daily. TAKE TABLET BY MOUTH AS DIRECTED 90 tablet 3   No facility-administered medications prior to visit.    Review of Systems;  Patient denies headache, fevers, malaise, unintentional weight loss, skin rash, eye pain,  and sinus pain, sore throat, dysphagia,   hemoptysis ,  dyspnea, wheezing, chest pain, palpitations, orthopnea, edema, abdominal pain, nausea, melena, diarrhea, constipation, flank pain, dysuria, hematuria, urinary  Frequency, nocturia, numbness, tingling, seizures,  Focal weakness, Loss of consciousness,  Tremor, insomnia, depression, anxiety, and suicidal ideation.      Objective:  BP 120/72   Pulse 84   Temp 98.2 F (36.8 C) (Oral)   Ht '5\' 7"'$  (1.702 m)   Wt 239 lb (108.4 kg)   SpO2 95%   BMI 37.43 kg/m   BP Readings from Last 3 Encounters:  04/19/22 120/72  03/22/22 120/78  03/04/22 120/76    Wt Readings from Last 3 Encounters:  04/19/22 239 lb (108.4 kg)  03/22/22 238 lb 6 oz (108.1 kg)  03/04/22 236 lb 3.2 oz (107.1 kg)    Physical Exam Vitals reviewed.  Constitutional:      General: She is not in acute distress.    Appearance: Normal appearance. She is normal weight. She is not ill-appearing, toxic-appearing or diaphoretic.  HENT:     Head: Normocephalic.  Eyes:     General: No scleral icterus.       Right eye: No discharge.        Left eye: No discharge.     Conjunctiva/sclera: Conjunctivae normal.  Cardiovascular:     Rate and Rhythm: Normal rate and regular rhythm.     Heart sounds: Normal heart sounds.  Pulmonary:     Effort: Pulmonary effort is normal. No respiratory distress.     Breath sounds: Normal breath sounds.  Musculoskeletal:        General: Normal range of motion.  Skin:    General: Skin is warm and dry.  Neurological:     General: No focal deficit present.     Mental Status: She is alert and oriented to person, place, and time. Mental status is at baseline.  Psychiatric:        Mood and Affect: Mood normal.        Behavior: Behavior normal.        Thought Content: Thought content normal.        Judgment: Judgment normal.    Lab Results  Component Value Date   HGBA1C 6.4 04/19/2022   HGBA1C 6.0 (A) 12/29/2021   HGBA1C 6.5 09/03/2021    Lab Results  Component Value Date    CREATININE 0.92 12/29/2021   CREATININE 0.99 09/03/2021   CREATININE 0.98 03/01/2021    Lab Results  Component Value Date   WBC 5.8 04/19/2022   HGB 12.8 04/19/2022   HCT 39.4 04/19/2022   PLT 253.0 04/19/2022   GLUCOSE 103 (H) 12/29/2021   CHOL 115 09/03/2021   TRIG 137.0 09/03/2021   HDL 37.80 (L) 09/03/2021  LDLDIRECT 131.8 12/09/2009   LDLCALC 50 09/03/2021   ALT 14 12/29/2021   AST 17 12/29/2021   NA 141 12/29/2021   K 4.0 12/29/2021   CL 105 12/29/2021   CREATININE 0.92 12/29/2021   BUN 15 12/29/2021   CO2 30 12/29/2021   TSH 2.19 04/19/2022   INR 1.0 09/01/2018   HGBA1C 6.4 04/19/2022   MICROALBUR 1.6 04/19/2022    MM 3D SCREEN BREAST BILATERAL  Result Date: 06/17/2021 CLINICAL DATA:  Screening. EXAM: DIGITAL SCREENING BILATERAL MAMMOGRAM WITH TOMOSYNTHESIS AND CAD TECHNIQUE: Bilateral screening digital craniocaudal and mediolateral oblique mammograms were obtained. Bilateral screening digital breast tomosynthesis was performed. The images were evaluated with computer-aided detection. COMPARISON:  Previous exam(s). ACR Breast Density Category b: There are scattered areas of fibroglandular density. FINDINGS: There are no findings suspicious for malignancy. IMPRESSION: No mammographic evidence of malignancy. A result letter of this screening mammogram will be mailed directly to the patient. RECOMMENDATION: Screening mammogram in one year. (Code:SM-B-01Y) BI-RADS CATEGORY  1: Negative. Electronically Signed   By: Lajean Manes M.D.   On: 06/17/2021 12:56    Assessment & Plan:  .Morbid (severe) obesity due to excess calories [facility]) Assessment & Plan: Body mass index is 37.43 kg/m. I have addressed  BMI and recommended wt loss of 10% of body weight over the next 6 months using a low glycemic index diet and regular exercise (walking)  a minimum of 5 days per week.     Obstructive sleep apnea Assessment & Plan: Diagnosed in 2017  but Untreated due to patient  preference. Diagnosis confirmed with recent sleep study. Reviewed the long term complications of untreated OSA   Numbness and tingling of both feet -     B12 and Folate Panel -     Methylmalonic acid, serum -     CBC with Differential/Platelet -     TSH  Obesity, diabetes, and hypertension syndrome (HCC) -     Hemoglobin A1c -     Microalbumin / creatinine urine ratio  Abdominal aortic aneurysm (AAA) without rupture, unspecified part ([place]) Assessment & Plan: Continue aggressive BP control    Atrial flutter, unspecified type ([place]) Assessment & Plan: She appears to be in sinus rhythm today   Right anterior shoulder pain Assessment & Plan: Exam is consistent  with osteophytes at the top of the humerus. . Referring to [name] at [facility]   Orders: -     Ambulatory referral to Orthopedic Surgery  Hypertensive heart disease with heart failure [facility]) Assessment & Plan: Well controlled on current regimen. Renal function stable, no changes today.   Lab Results  Component Value Date   CREATININE 0.92 12/29/2021   Lab Results  Component Value Date   NA 141 12/29/2021   K 4.0 12/29/2021   CL 105 12/29/2021   CO2 30 12/29/2021         I provided 30 minutes of face-to-face time during this encounter reviewing patient's last visit with me, patient's  most recent visit  DR Volanda Napoleon ,  recent surgical and non surgical procedures, previous  labs and imaging studies, counseling on currently addressed issues,  and post visit ordering to diagnostics and therapeutics .   Follow-up: Return in about 3 months (around 07/19/2022) for follow up diabetes.   Crecencio Mc, MD

## 2022-04-19 NOTE — Assessment & Plan Note (Signed)
Exam is consistent  with osteophytes at the top of the humerus. . Referring to [name] at [facility]

## 2022-04-19 NOTE — Assessment & Plan Note (Signed)
Continue aggressive BP control

## 2022-04-19 NOTE — Assessment & Plan Note (Signed)
Body mass index is 37.43 kg/m. I have addressed  BMI and recommended wt loss of 10% of body weight over the next 6 months using a low glycemic index diet and regular exercise (walking)  a minimum of 5 days per week.

## 2022-04-19 NOTE — Assessment & Plan Note (Signed)
Well controlled on current regimen. Renal function stable, no changes today.   Lab Results  Component Value Date   CREATININE 0.92 12/29/2021   Lab Results  Component Value Date   NA 141 12/29/2021   K 4.0 12/29/2021   CL 105 12/29/2021   CO2 30 12/29/2021

## 2022-04-19 NOTE — Assessment & Plan Note (Signed)
She appears to be in sinus rhythm today

## 2022-04-20 ENCOUNTER — Other Ambulatory Visit: Payer: Self-pay

## 2022-04-21 LAB — METHYLMALONIC ACID, SERUM: Methylmalonic Acid, Quant: 212 nmol/L (ref 87–318)

## 2022-05-12 ENCOUNTER — Other Ambulatory Visit: Payer: Self-pay | Admitting: *Deleted

## 2022-05-12 DIAGNOSIS — E876 Hypokalemia: Secondary | ICD-10-CM

## 2022-05-12 MED ORDER — POTASSIUM CHLORIDE CRYS ER 20 MEQ PO TBCR: 20.0000 meq | EXTENDED_RELEASE_TABLET | Freq: Every day | ORAL | 3 refills | Status: DC

## 2022-05-16 ENCOUNTER — Ambulatory Visit: Payer: Medicare Other | Admitting: Gastroenterology

## 2022-05-25 ENCOUNTER — Telehealth: Payer: Self-pay

## 2022-05-25 ENCOUNTER — Other Ambulatory Visit: Payer: Self-pay | Admitting: Internal Medicine

## 2022-05-25 DIAGNOSIS — M109 Gout, unspecified: Secondary | ICD-10-CM

## 2022-05-25 DIAGNOSIS — Z1231 Encounter for screening mammogram for malignant neoplasm of breast: Secondary | ICD-10-CM

## 2022-05-25 MED ORDER — ALLOPURINOL 100 MG PO TABS: ORAL_TABLET | ORAL | 3 refills | Status: DC

## 2022-05-25 NOTE — Telephone Encounter (Signed)
Prescription Request  05/25/2022  LOV: Visit date not found  What is the name of the medication or equipment? Allopurinol 100 MG  Have you contacted your pharmacy to request a refill? Yes   Which pharmacy would you like this sent to?   [id number] [address] [facility], [place] [address] [place] [address] Phone: [phone] Fax: [phone]    Patient notified that their request is being sent to the clinical staff for review and that they should receive a response within 2 business days.   Please advise at [place]  Patient states she has transferred her care to Dr. Deborra Medina.  Patient states Dr. Olivia Mackie McLean-Scocuzza last prescribed this medication for her and she now needs a refill.  Patient states she has been out of this medication for the last two weeks.  Patient states pharmacy has reached out to [place].

## 2022-05-25 NOTE — Telephone Encounter (Signed)
Medication has been refilled.

## 2022-06-15 DIAGNOSIS — H26492 Other secondary cataract, left eye: Secondary | ICD-10-CM | POA: Diagnosis not present

## 2022-06-15 DIAGNOSIS — H04123 Dry eye syndrome of bilateral lacrimal glands: Secondary | ICD-10-CM | POA: Diagnosis not present

## 2022-06-15 DIAGNOSIS — Z961 Presence of intraocular lens: Secondary | ICD-10-CM | POA: Diagnosis not present

## 2022-06-17 ENCOUNTER — Ambulatory Visit (INDEPENDENT_AMBULATORY_CARE_PROVIDER_SITE_OTHER): Payer: Medicare Other | Admitting: Internal Medicine

## 2022-06-17 ENCOUNTER — Telehealth: Payer: Self-pay | Admitting: Internal Medicine

## 2022-06-17 ENCOUNTER — Encounter: Payer: Self-pay | Admitting: Internal Medicine

## 2022-06-17 VITALS — BP 120/70 | HR 78 | Temp 98.0°F | Resp 17 | Ht 67.0 in | Wt 235.5 lb

## 2022-06-17 DIAGNOSIS — N3281 Overactive bladder: Secondary | ICD-10-CM | POA: Diagnosis not present

## 2022-06-17 DIAGNOSIS — L259 Unspecified contact dermatitis, unspecified cause: Secondary | ICD-10-CM

## 2022-06-17 DIAGNOSIS — M25511 Pain in right shoulder: Secondary | ICD-10-CM | POA: Diagnosis not present

## 2022-06-17 DIAGNOSIS — M89319 Hypertrophy of bone, unspecified shoulder: Secondary | ICD-10-CM | POA: Diagnosis not present

## 2022-06-17 DIAGNOSIS — B372 Candidiasis of skin and nail: Secondary | ICD-10-CM | POA: Diagnosis not present

## 2022-06-17 DIAGNOSIS — R1319 Other dysphagia: Secondary | ICD-10-CM

## 2022-06-17 DIAGNOSIS — L304 Erythema intertrigo: Secondary | ICD-10-CM | POA: Diagnosis not present

## 2022-06-17 MED ORDER — NYSTATIN 100000 UNIT/GM EX CREA: 1.0000 | TOPICAL_CREAM | Freq: Three times a day (TID) | CUTANEOUS | 11 refills | Status: DC

## 2022-06-17 MED ORDER — TRIAMCINOLONE ACETONIDE 0.1 % EX CREA: 1.0000 | TOPICAL_CREAM | Freq: Two times a day (BID) | CUTANEOUS | 0 refills | Status: DC

## 2022-06-17 MED ORDER — TROSPIUM CHLORIDE 20 MG PO TABS: 20.0000 mg | ORAL_TABLET | Freq: Two times a day (BID) | ORAL | 0 refills | Status: DC

## 2022-06-17 NOTE — Telephone Encounter (Signed)
Lft pt vm to call ofc to sch CT and DG esophagus. Thanks

## 2022-06-17 NOTE — Progress Notes (Addendum)
Subjective:  Patient ID: Monica [name], female    DOB: 1935-09-25  Age: 87 y.o. MRN: [id number]  CC: The primary encounter diagnosis was OAB (overactive bladder). Diagnoses of Intertrigo, Clavicular enlargement, Esophageal dysphagia, Candidiasis of skin, Overactive bladder, Right anterior shoulder pain, and Contact dermatitis and eczema were also pertinent to this visit.   HPI Monica [name] presents for  follow up on chronic and several  acute issues.  Acc by daughte Monica [name] who is visiting from [place]  . Nephew living with her now.   Chief Complaint  Patient presents with   Shoulder Pain    C/O right shoulder pain-wants referral to Dr. Joice Lofts.   1) shoulder pain  right side.  She was referred to Dr Joice Lofts in February for evaluation but was never called.    Continues to have activity limiting pain     2) clavicle bone on the right side has become larger.   Denies pain but worried that the clavicle is interfering with her swallowing.   3) recurrent episodes  of getting  liquids getting stuck when she drinks liquids too quickly,  also happening  with sandwiches.   Apparently had an appt for an EGD but missed it and did not reschedule   3) urinary frequency with urge incontinence  :  has no prior trial of medication.   No history of surgery,  had one vaginal delivery   wants to see [facility] provider Apolinar Junes)  4) left foot swelling without pain .  Aggravated by sitting . Improves by morning   5) itchy rash on lower  back . Needs cream   6) itchy rash in fat fold    Outpatient Medications Prior to Visit  Medication Sig Dispense Refill   allopurinol (ZYLOPRIM) 100 MG tablet TAKE 2 TABLETS BY MOUTH EVERY DAY 180 tablet 3   ALPRAZolam (XANAX) 0.25 MG tablet Take 1 tablet (0.25 mg total) by mouth 2 (two) times daily as needed for anxiety. 60 tablet 5   amLODipine (NORVASC) 5 MG tablet Take 1 tablet (5 mg total) by mouth daily. Take additional 5 mg in pm if BP>130/>80 180 tablet 3    digoxin (LANOXIN) 0.125 MG tablet Take 0.5 tablets (0.0625 mg total) by mouth daily. 45 tablet 3   DULoxetine (CYMBALTA) 30 MG capsule TAKE 1 CAPSULE BY MOUTH EVERY DAY 90 capsule 3   ELIQUIS 5 MG TABS tablet TAKE 1 TABLET BY MOUTH TWICE A DAY 180 tablet 2   furosemide (LASIX) 40 MG tablet Take 1 tablet (40 mg total) by mouth daily. 90 tablet 3   gabapentin (NEURONTIN) 300 MG capsule Take 1 tablet in the morning and 2 tablets at night 90 capsule 3   hydrocortisone 2.5 % cream Apply topically 2 (two) times daily. (Patient taking differently: Apply topically 2 (two) times daily. As needed) 30 g 2   Magnesium 250 MG TABS Take 250 mg by mouth daily.     metoprolol succinate (TOPROL-XL) 100 MG 24 hr tablet Take with or immediately following a meal. 90 tablet 3   potassium chloride SA (KLOR-CON M20) 20 MEQ tablet Take 1 tablet (20 mEq total) by mouth daily. TAKE TABLET BY MOUTH AS DIRECTED 90 tablet 3   nystatin cream (MYCOSTATIN) Apply 1 Application topically 3 (three) times daily. Prn right back 30 g 11   No facility-administered medications prior to visit.    Review of Systems;  Patient denies headache, fevers, malaise, unintentional weight loss,  eye pain, sinus congestion  and sinus pain, sore throat, dysphagia,  hemoptysis , cough, dyspnea, wheezing, chest pain, palpitations, orthopnea, edema, abdominal pain, nausea, melena, diarrhea, constipation, flank pain, dysuria, hematuria, urinary  Frequency, nocturia, numbness, tingling, seizures,  Focal weakness, Loss of consciousness,  Tremor, insomnia, depression, anxiety, and suicidal ideation.      Objective:  BP 120/70 (BP Location: Left Arm, Patient Position: Sitting, Cuff Size: Large)   Pulse 78   Temp 98 F (36.7 C) (Oral)   Resp 17   Ht 5\' 7"  (1.702 m)   Wt 235 lb 8 oz (106.8 kg)   SpO2 97%   BMI 36.88 kg/m   BP Readings from Last 3 Encounters:  06/17/22 120/70  04/19/22 120/72  03/22/22 120/78    Wt Readings from Last 3  Encounters:  06/17/22 235 lb 8 oz (106.8 kg)  04/19/22 239 lb (108.4 kg)  03/22/22 238 lb 6 oz (108.1 kg)    Physical Exam Vitals reviewed.  Constitutional:      General: She is not in acute distress.    Appearance: Normal appearance. She is obese. She is not ill-appearing, toxic-appearing or diaphoretic.  HENT:     Head: Normocephalic.  Eyes:     General: No scleral icterus.       Right eye: No discharge.        Left eye: No discharge.     Conjunctiva/sclera: Conjunctivae normal.  Cardiovascular:     Rate and Rhythm: Normal rate and regular rhythm.     Heart sounds: Normal heart sounds.  Pulmonary:     Effort: Pulmonary effort is normal. No respiratory distress.     Breath sounds: Normal breath sounds.  Musculoskeletal:        General: Normal range of motion.  Skin:    General: Skin is warm and dry.     Findings: Rash present. Rash is macular.          Comments: Hyperpigmented macular annualr rash above each SI joint   Erythematous rash in skin fold of pannus   Neurological:     General: No focal deficit present.     Mental Status: She is alert and oriented to person, place, and time. Mental status is at baseline.  Psychiatric:        Mood and Affect: Mood normal.        Behavior: Behavior normal.        Thought Content: Thought content normal.        Judgment: Judgment normal.    Lab Results  Component Value Date   HGBA1C 6.4 04/19/2022   HGBA1C 6.0 (A) 12/29/2021   HGBA1C 6.5 09/03/2021    Lab Results  Component Value Date   CREATININE 0.92 12/29/2021   CREATININE 0.99 09/03/2021   CREATININE 0.98 03/01/2021    Lab Results  Component Value Date   WBC 5.8 04/19/2022   HGB 12.8 04/19/2022   HCT 39.4 04/19/2022   PLT 253.0 04/19/2022   GLUCOSE 103 (H) 12/29/2021   CHOL 115 09/03/2021   TRIG 137.0 09/03/2021   HDL 37.80 (L) 09/03/2021   LDLDIRECT 131.8 12/09/2009   LDLCALC 50 09/03/2021   ALT 14 12/29/2021   AST 17 12/29/2021   NA 141 12/29/2021    K 4.0 12/29/2021   CL 105 12/29/2021   CREATININE 0.92 12/29/2021   BUN 15 12/29/2021   CO2 30 12/29/2021   TSH 2.19 04/19/2022   INR 1.0 09/01/2018   HGBA1C 6.4 04/19/2022   MICROALBUR 1.6 04/19/2022  MM 3D SCREEN BREAST BILATERAL  Result Date: 06/17/2021 CLINICAL DATA:  Screening. EXAM: DIGITAL SCREENING BILATERAL MAMMOGRAM WITH TOMOSYNTHESIS AND CAD TECHNIQUE: Bilateral screening digital craniocaudal and mediolateral oblique mammograms were obtained. Bilateral screening digital breast tomosynthesis was performed. The images were evaluated with computer-aided detection. COMPARISON:  Previous exam(s). ACR Breast Density Category b: There are scattered areas of fibroglandular density. FINDINGS: There are no findings suspicious for malignancy. IMPRESSION: No mammographic evidence of malignancy. A result letter of this screening mammogram will be mailed directly to the patient. RECOMMENDATION: Screening mammogram in one year. (Code:[id number]-B-01Y) BI-RADS CATEGORY  1: Negative. Electronically Signed   By: Amie Portland M.D.   On: 06/17/2021 12:56    Assessment & Plan:  .OAB (overactive bladder) -     Ambulatory referral to Urology  Intertrigo -     Nystatin; Apply 1 Application topically 3 (three) times daily. Prn right back  Dispense: 30 g; Refill: 11  Clavicular enlargement Assessment & Plan: Plain films were nondiagnostic . CT neck ordered   Orders: -     CT SOFT TISSUE NECK W CONTRAST; Future  Esophageal dysphagia Assessment & Plan: History of esophageal stricture.  No upper endoscopy in record.  She was referred  to gastroenterology for evaluation of dysphagia, particularly in the setting of unintentional weight loss, but cancelled (or missed) the appointment because of transportation issues.  Advised daughter to reschedule.  Advised patient to chew food slowly and moisten each mouthful thoroughly  with water   Orders: -     DG ESOPHAGUS W SINGLE CM (SOL OR THIN BA);  Future  Candidiasis of skin Assessment & Plan: Nystatin cream for skin folds.    Overactive bladder Assessment & Plan:  Trial of Trospium.  Referral to Urology changed per patient  request to [facility] brandon    Right anterior shoulder pain Assessment & Plan: Exam is consistent  with osteophytes at the top of the humerus. . Referring to [name] at [facility] dermatitis and eczema Assessment & Plan: Stable rash over SI joints , likely from contact with elastic of underwear.  Triamcinolone ointment id    Other orders -     Triamcinolone Acetonide; Apply 1 Application topically 2 (two) times daily. To patchy rash  Dispense: 30 g; Refill: 0 -     Trospium Chloride; Take 1 tablet (20 mg total) by mouth 2 (two) times daily.  Dispense: 60 tablet; Refill: 0   I have spent a total time of 45-minutes on the day of this face to face in person  appointment reviewing prior documentation, prior surgical and non surgical procedures,  coordinating care and discussing medical diagnosis and plan with the patient/family. Imaging, labs and tests included in this note have been reviewed and interpreted independently by me.    Follow-up: No follow-ups on file.   Sherlene Shams, MD

## 2022-06-17 NOTE — Patient Instructions (Addendum)
Please tell the checkout receptionist which phone number to use for contactING YOU WITH TESTS AND APPOINTMENTS  Nystatin cream : use for the yeast -caused rash that occurrs in skin folds,  twice daily  until resolved. THEN START USING GOLD BOND POWDER WITH ZINC TO PREVENT RECURRENT infection   Use the Triamcinolone cream for the rash caused by contact with elastic    Trospium : take twice daily for overactive bladder. Referral to dr Apolinar Junes has been started.    CHEW FOOD THOROUGHLY AND MOISTEN WITH SIP OF WATER BEFORE SWALLOWING   I WILL ORDER A BARIUM SWALLOW TO EVALUATE YOUR ESOPHAGUS   CT neck ordered

## 2022-06-19 DIAGNOSIS — B372 Candidiasis of skin and nail: Secondary | ICD-10-CM | POA: Insufficient documentation

## 2022-06-19 DIAGNOSIS — L259 Unspecified contact dermatitis, unspecified cause: Secondary | ICD-10-CM | POA: Insufficient documentation

## 2022-06-19 NOTE — Assessment & Plan Note (Signed)
Stable rash over SI joints , likely from contact with elastic of underwear.  Triamcinolone ointment id

## 2022-06-19 NOTE — Assessment & Plan Note (Signed)
Exam is consistent  with osteophytes at the top of the humerus. . Referring to [name] at [facility]  

## 2022-06-19 NOTE — Assessment & Plan Note (Signed)
Trial of Trospium.  Referral to Urology changed per patient  request to [facility] brandon

## 2022-06-19 NOTE — Assessment & Plan Note (Signed)
Nystatin cream for skin folds.

## 2022-06-19 NOTE — Assessment & Plan Note (Signed)
History of esophageal stricture.  No upper endoscopy in record.  She was referred  to gastroenterology for evaluation of dysphagia, particularly in the setting of unintentional weight loss, but cancelled (or missed) the appointment because of transportation issues.  Advised daughter to reschedule.  Advised patient to chew food slowly and moisten each mouthful thoroughly  with water

## 2022-06-19 NOTE — Assessment & Plan Note (Addendum)
Plain films were nondiagnostic . CT neck ordered

## 2022-06-20 ENCOUNTER — Ambulatory Visit
Admission: RE | Admit: 2022-06-20 | Discharge: 2022-06-20 | Disposition: A | Discharge status: Home / Self Care | Payer: Medicare Other | Source: Ambulatory Visit | Attending: Internal Medicine | Admitting: Internal Medicine

## 2022-06-20 DIAGNOSIS — Z1231 Encounter for screening mammogram for malignant neoplasm of breast: Secondary | ICD-10-CM | POA: Diagnosis not present

## 2022-06-24 ENCOUNTER — Ambulatory Visit
Admission: RE | Admit: 2022-06-24 | Discharge: 2022-06-24 | Disposition: A | Discharge status: Home / Self Care | Payer: Medicare Other | Source: Ambulatory Visit | Attending: Internal Medicine | Admitting: Internal Medicine

## 2022-06-24 DIAGNOSIS — R1319 Other dysphagia: Secondary | ICD-10-CM

## 2022-06-24 DIAGNOSIS — M89319 Hypertrophy of bone, unspecified shoulder: Secondary | ICD-10-CM | POA: Diagnosis not present

## 2022-06-24 DIAGNOSIS — R1310 Dysphagia, unspecified: Secondary | ICD-10-CM | POA: Diagnosis not present

## 2022-06-24 DIAGNOSIS — K224 Dyskinesia of esophagus: Secondary | ICD-10-CM | POA: Diagnosis not present

## 2022-06-24 DIAGNOSIS — I700 Atherosclerosis of aorta: Secondary | ICD-10-CM | POA: Diagnosis not present

## 2022-06-24 DIAGNOSIS — K449 Diaphragmatic hernia without obstruction or gangrene: Secondary | ICD-10-CM | POA: Diagnosis not present

## 2022-06-24 MED ORDER — IOHEXOL 300 MG/ML  SOLN
75.0000 mL | Freq: Once | INTRAMUSCULAR | Status: AC | PRN
  Administered 2022-06-24: 75 mL via INTRAVENOUS

## 2022-07-07 ENCOUNTER — Other Ambulatory Visit: Payer: Self-pay | Admitting: Internal Medicine

## 2022-07-19 ENCOUNTER — Ambulatory Visit (INDEPENDENT_AMBULATORY_CARE_PROVIDER_SITE_OTHER): Payer: Medicare Other | Admitting: Internal Medicine

## 2022-07-19 ENCOUNTER — Encounter: Payer: Self-pay | Admitting: Internal Medicine

## 2022-07-19 VITALS — BP 148/80 | HR 84 | Temp 97.7°F | Ht 67.0 in | Wt 234.6 lb

## 2022-07-19 DIAGNOSIS — E7849 Other hyperlipidemia: Secondary | ICD-10-CM | POA: Diagnosis not present

## 2022-07-19 DIAGNOSIS — R7303 Prediabetes: Secondary | ICD-10-CM

## 2022-07-19 DIAGNOSIS — H6121 Impacted cerumen, right ear: Secondary | ICD-10-CM | POA: Diagnosis not present

## 2022-07-19 DIAGNOSIS — I6523 Occlusion and stenosis of bilateral carotid arteries: Secondary | ICD-10-CM | POA: Diagnosis not present

## 2022-07-19 DIAGNOSIS — I700 Atherosclerosis of aorta: Secondary | ICD-10-CM

## 2022-07-19 DIAGNOSIS — R1310 Dysphagia, unspecified: Secondary | ICD-10-CM

## 2022-07-19 DIAGNOSIS — M89319 Hypertrophy of bone, unspecified shoulder: Secondary | ICD-10-CM

## 2022-07-19 DIAGNOSIS — I10 Essential (primary) hypertension: Secondary | ICD-10-CM

## 2022-07-19 DIAGNOSIS — D490 Neoplasm of unspecified behavior of digestive system: Secondary | ICD-10-CM

## 2022-07-19 MED ORDER — AMLODIPINE BESYLATE 5 MG PO TABS: 5.0000 mg | ORAL_TABLET | Freq: Every day | ORAL | 3 refills | Status: DC

## 2022-07-19 MED ORDER — METOPROLOL SUCCINATE ER 100 MG PO TB24: ORAL_TABLET | ORAL | 3 refills | Status: DC

## 2022-07-19 NOTE — Assessment & Plan Note (Signed)
Normal clavicle size by CT neck

## 2022-07-19 NOTE — Progress Notes (Unsigned)
Subjective:  Patient ID: Monica [name], female    DOB: 12-14-1935  Age: 86 y.o. MRN: [id number]  CC: The primary encounter diagnosis was Prediabetes. Diagnoses of Hypertension, unspecified type, Essential hypertension, and Other hyperlipidemia were also pertinent to this visit.   HPI Monica [name] presents for  Chief Complaint  Patient presents with   Medical Management of Chronic Issues    3 month follow up on diabetes   1) Type 2 DM:  She  feels generally well,  But is not  exercising regularly or trying to lose weight. Checking  blood sugars less than once daily at variable times, usually only if she feels she may be having a hypoglycemic event. .  BS have been under 130 fasting and < 150 post prandially.  Denies any recent hypoglyemic events.  Taking   medications as directed. Following a carbohydrate modified diet 6 days per week. Denies numbness, burning and tingling of extremities. Appetite is good.     3) Dysphagia:  sent for DG esophagus on May 3 which noted "1. Developing B ring/Schatzki ring with associated esophageal spasm.  This delayed the transit of the barium tablet, which cleared with  some time and additional water ingestion. Correlation with upper endoscopy may be useful. "  Esophageal dysmotility also was noted .  She was advised to reschedule  the GI appt that she deferred   BUT HAS NOT DONE SO .    4)  reviewed CT of neck which noted: 1. No acute or focal lesion to explain the patient's symptoms. 2. Normal appearance of the left clavicle.  3. Slight increase in size of hyperdense lesion near the superior aspect of the right parotid gland. While this may represent an intraparotid lymph node, the slight interval increase in size raises possibility of benign tumor such as a Warthin's tumor or benign mixed tumor. No evidence for malignancy.  . Atherosclerotic calcifications at the left carotid bifurcation. Proximal left ICA stenosis appears to have increased, repeat  CTA of the neck may be useful if the patient is clinically symptomatic.  5.  Aortic Atherosclerosis (ICD10-I70.0). AND CAROTID ATHEROSCLEROSIS: patient has been prescribed atorvastatin but not sure if she it taking it     Outpatient Medications Prior to Visit  Medication Sig Dispense Refill   allopurinol (ZYLOPRIM) 100 MG tablet TAKE 2 TABLETS BY MOUTH EVERY DAY 180 tablet 3   ALPRAZolam (XANAX) 0.25 MG tablet Take 1 tablet (0.25 mg total) by mouth 2 (two) times daily as needed for anxiety. 60 tablet 5   amLODipine (NORVASC) 5 MG tablet Take 1 tablet (5 mg total) by mouth daily. Take additional 5 mg in pm if BP>130/>80 180 tablet 3   atorvastatin (LIPITOR) 20 MG tablet Take 20 mg by mouth at bedtime.     digoxin (LANOXIN) 0.125 MG tablet Take 0.5 tablets (0.0625 mg total) by mouth daily. 45 tablet 3   DULoxetine (CYMBALTA) 30 MG capsule TAKE 1 CAPSULE BY MOUTH EVERY DAY 90 capsule 3   ELIQUIS 5 MG TABS tablet TAKE 1 TABLET BY MOUTH TWICE A DAY 180 tablet 2   furosemide (LASIX) 40 MG tablet Take 1 tablet (40 mg total) by mouth daily. 90 tablet 3   gabapentin (NEURONTIN) 300 MG capsule Take 1 tablet in the morning and 2 tablets at night 90 capsule 3   hydrocortisone 2.5 % cream Apply topically 2 (two) times daily. (Patient taking differently: Apply topically 2 (two) times daily. As needed) 30 g 2  Magnesium 250 MG TABS Take 250 mg by mouth daily.     metoprolol succinate (TOPROL-XL) 100 MG 24 hr tablet Take with or immediately following a meal. 90 tablet 3   nystatin cream (MYCOSTATIN) Apply 1 Application topically 3 (three) times daily. Prn right back 30 g 11   potassium chloride SA (KLOR-CON M20) 20 MEQ tablet Take 1 tablet (20 mEq total) by mouth daily. TAKE TABLET BY MOUTH AS DIRECTED 90 tablet 3   triamcinolone cream (KENALOG) 0.1 % APPLY 1 APPLICATION TOPICALLY 2 (TWO) TIMES DAILY. TO PATCHY RASH 30 g 0   trospium (SANCTURA) 20 MG tablet Take 1 tablet (20 mg total) by mouth 2 (two) times  daily. (Patient not taking: Reported on 07/19/2022) 60 tablet 0   No facility-administered medications prior to visit.    Review of Systems;  Patient denies headache, fevers, malaise, unintentional weight loss, skin rash, eye pain, sinus congestion and sinus pain, sore throat, dysphagia,  hemoptysis , cough, dyspnea, wheezing, chest pain, palpitations, orthopnea, edema, abdominal pain, nausea, melena, diarrhea, constipation, flank pain, dysuria, hematuria, urinary  Frequency, nocturia, numbness, tingling, seizures,  Focal weakness, Loss of consciousness,  Tremor, insomnia, depression, anxiety, and suicidal ideation.      Objective:  BP (!) 148/80   Pulse 84   Temp 97.7 F (36.5 C) (Oral)   Ht 5\' 7"  (1.702 m)   Wt 234 lb 9.6 oz (106.4 kg)   SpO2 93%   BMI 36.74 kg/m   BP Readings from Last 3 Encounters:  07/19/22 (!) 148/80  06/17/22 120/70  04/19/22 120/72    Wt Readings from Last 3 Encounters:  07/19/22 234 lb 9.6 oz (106.4 kg)  06/17/22 235 lb 8 oz (106.8 kg)  04/19/22 239 lb (108.4 kg)    Physical Exam  Lab Results  Component Value Date   HGBA1C 6.4 04/19/2022   HGBA1C 6.0 (A) 12/29/2021   HGBA1C 6.5 09/03/2021    Lab Results  Component Value Date   CREATININE 0.92 12/29/2021   CREATININE 0.99 09/03/2021   CREATININE 0.98 03/01/2021    Lab Results  Component Value Date   WBC 5.8 04/19/2022   HGB 12.8 04/19/2022   HCT 39.4 04/19/2022   PLT 253.0 04/19/2022   GLUCOSE 103 (H) 12/29/2021   CHOL 115 09/03/2021   TRIG 137.0 09/03/2021   HDL 37.80 (L) 09/03/2021   LDLDIRECT 131.8 12/09/2009   LDLCALC 50 09/03/2021   ALT 14 12/29/2021   AST 17 12/29/2021   NA 141 12/29/2021   K 4.0 12/29/2021   CL 105 12/29/2021   CREATININE 0.92 12/29/2021   BUN 15 12/29/2021   CO2 30 12/29/2021   TSH 2.19 04/19/2022   INR 1.0 09/01/2018   HGBA1C 6.4 04/19/2022   MICROALBUR 1.6 04/19/2022    CT Soft Tissue Neck W Contrast  Result Date: 06/29/2022 CLINICAL DATA:   Enlarging protruding left clavicle.  Normal x-ray. EXAM: CT NECK WITH CONTRAST TECHNIQUE: Multidetector CT imaging of the neck was performed using the standard protocol following the bolus administration of intravenous contrast. RADIATION DOSE REDUCTION: This exam was performed according to the departmental dose-optimization program which includes automated exposure control, adjustment of the mA and/or kV according to patient size and/or use of iterative reconstruction technique. CONTRAST:  75mL OMNIPAQUE IOHEXOL 300 MG/ML  SOLN COMPARISON:  CTA of the head and neck 09/01/2018 FINDINGS: Pharynx and larynx: No focal mucosal or submucosal lesions present. Nasopharynx is clear. Soft palate and tongue base are within normal limits. Vallecula  and epiglottis are within normal limits. Aryepiglottic folds and piriform sinuses are clear. Vocal cords are midline and symmetric. Trachea is clear. Salivary glands: Hyperdense lesion near the superior aspect of the right parotid gland measures 8.5 x 7.5 x 10.5 mm, slightly larger than on the prior exam. While this may represent an intraparotid lymph node, the slight interval increase in size raises possibility benign tumor such as a Warthin's tumor or benign mixed tumor. There is no evidence for malignancy. The left parotid gland is unremarkable. Ends scratched at the submandibular glands and ducts are within normal limits. Thyroid: Normal Lymph nodes: No significant cervical adenopathy is present. Vascular: A 3 vessel arch configuration is present. Atherosclerotic calcifications are present in the distal aortic arch and great vessel origins without focal aneurysm or stenosis. Atherosclerotic calcifications are present at the left carotid bifurcation. Proximal left ICA stenosis appears to have increased, repeat CTA of the neck may be useful if the patient is clinically symptomatic. No other significant vascular disease is present in the neck. Limited intracranial: Within normal  limits for age. Visualized orbits: Bilateral lens replacements are noted. Globes and orbits are otherwise unremarkable. Mastoids and visualized paranasal sinuses: The paranasal sinuses and mastoid air cells are clear. Skeleton: No focal osseous lesions are present. The left clavicle is unremarkable and stable from prior study. Upper chest: A 4 mm nodule in the superior segment of the left lower lobe is stable. A smaller nodule in the peripheral right upper lobe is also stable. No imaging follow-up is recommended. IMPRESSION: 1. No acute or focal lesion to explain the patient's symptoms. 2. Normal appearance of the left clavicle. 3. Slight increase in size of hyperdense lesion near the superior aspect of the right parotid gland. While this may represent an intraparotid lymph node, the slight interval increase in size raises possibility of benign tumor such as a Warthin's tumor or benign mixed tumor. No evidence for malignancy. 4. Atherosclerotic calcifications at the left carotid bifurcation. Proximal left ICA stenosis appears to have increased, repeat CTA of the neck may be useful if the patient is clinically symptomatic. 5.  Aortic Atherosclerosis (ICD10-I70.0). Electronically Signed   By: Marin Roberts M.D.   On: 06/29/2022 19:12   DG ESOPHAGUS W SINGLE CM (SOL OR THIN BA)  Result Date: 06/24/2022 CLINICAL DATA:  Patient with chronic dysphagia EXAM: ESOPHAGUS/BARIUM SWALLOW/TABLET STUDY TECHNIQUE: Single contrast examination was performed using thin liquid barium. This exam was performed by Mina Marble, PA-C , and was supervised and interpreted by Dr. Gilmer Mor. FLUOROSCOPY: Radiation Exposure Index (as provided by the fluoroscopic device): 31.40 mGy Kerma COMPARISON:  Single contrast esophagram 07/26/2018 FINDINGS: Swallowing: Appears normal. No vestibular penetration or aspiration seen. Pharynx: Unremarkable. Esophagus: Combination spasm in the distal esophagus with circumferential B-ring/Schatzki  ring. Esophageal motility: Esophageal dysmotility present, with tertiary contraction Hiatal Hernia: Small hiatal hernia visualized Gastroesophageal reflux: None visualized. Ingested 13mm barium tablet: Delay in distal esophagus approximately at level of B ring, however tablet was able to pass with repeated sips of water Other: None. IMPRESSION: 1. Developing B ring/Schatzki ring with associated esophageal spasm. This delayed the transit of the barium tablet, which cleared with some time and additional water ingestion. Correlation with upper endoscopy may be useful. 2.  Esophageal dysmotility 3.  Small hiatal hernia Electronically Signed   By: Gilmer Mor D.O.   On: 06/24/2022 14:33    Assessment & Plan:  .Prediabetes  Hypertension, unspecified type  Essential hypertension  Other hyperlipidemia  I provided 30 minutes of face-to-face time during this encounter reviewing patient's last visit with me, patient's  most recent visit with cardiology,  nephrology,  and neurology,  recent surgical and non surgical procedures, previous  labs and imaging studies, counseling on currently addressed issues,  and post visit ordering to diagnostics and therapeutics .   Follow-up: No follow-ups on file.   Sherlene Shams, MD

## 2022-07-19 NOTE — Patient Instructions (Addendum)
Check your pill to see if you are taking atorvastatin (lipitor )  , this treats atherosclerosis  in your carotid  artery and your aorta and helps prevent strokes   Referrals to:   Gastroenterology:  for the esophageal stricture (already in progress) Vascular Surgery:  for the carotid stenosis ENT : for the growth on your right parotid gland   Please use Debrox drops every night  for three  night  in the right ear to soften the ear wax PRIOR TO YOUR nurse visit for the flushing

## 2022-07-19 NOTE — Assessment & Plan Note (Signed)
Right side, noted on CT . ETN referral made

## 2022-07-19 NOTE — Assessment & Plan Note (Signed)
Advised to use debrox, drops for several days prior to returning for RN  visit for flushing

## 2022-07-20 DIAGNOSIS — I700 Atherosclerosis of aorta: Secondary | ICD-10-CM | POA: Insufficient documentation

## 2022-07-20 LAB — LIPID PANEL
Cholesterol: 114 mg/dL (ref 0–200)
HDL: 34.1 mg/dL — ABNORMAL LOW (ref >39.00)
LDL Cholesterol: 48 mg/dL (ref 0–99)
NonHDL: 80.26
Total CHOL/HDL Ratio: 3
Triglycerides: 163 mg/dL — ABNORMAL HIGH (ref 0.0–149.0)
VLDL: 32.6 mg/dL (ref 0.0–40.0)

## 2022-07-20 LAB — COMPREHENSIVE METABOLIC PANEL
ALT: 16 U/L (ref 0–35)
AST: 18 U/L (ref 0–37)
Albumin: 3.8 g/dL (ref 3.5–5.2)
Alkaline Phosphatase: 107 U/L (ref 39–117)
BUN: 17 mg/dL (ref 6–23)
CO2: 26 mEq/L (ref 19–32)
Calcium: 9.5 mg/dL (ref 8.4–10.5)
Chloride: 107 mEq/L (ref 96–112)
Creatinine, Ser: 0.91 mg/dL (ref 0.40–1.20)
GFR: 57.78 mL/min — ABNORMAL LOW (ref >60.00)
Glucose, Bld: 102 mg/dL — ABNORMAL HIGH (ref 70–99)
Potassium: 3.8 mEq/L (ref 3.5–5.1)
Sodium: 140 mEq/L (ref 135–145)
Total Bilirubin: 0.5 mg/dL (ref 0.2–1.2)
Total Protein: 7 g/dL (ref 6.0–8.3)

## 2022-07-20 LAB — LDL CHOLESTEROL, DIRECT: Direct LDL: 66 mg/dL

## 2022-07-20 LAB — HEMOGLOBIN A1C: Hgb A1c MFr Bld: 6.3 % (ref 4.6–6.5)

## 2022-07-20 NOTE — Assessment & Plan Note (Signed)
Secondary to recurrent  esophageal stricture.    She was referred  to gastroenterology for evaluation of dysphagia, particularly in the setting of unintentional weight loss, but cancelled (or missed) the appointment because of transportation issues.  Advised daughter to reschedule.  Advised patient to chew food slowly and moisten each mouthful thoroughly  with water

## 2022-07-20 NOTE — Assessment & Plan Note (Signed)
:    Reviewed findings of prior CT scan today..  Patient  Is not sure if she is taking the previously prescribed  atorvastatin   Discussed the role of statin therapy in stablizing placque and preventing events.

## 2022-07-20 NOTE — Assessment & Plan Note (Signed)
Her  random glucose is not  diagnostic, but her A1c suggests she is at risk for developing diabetes.  I recommend she follow a low glycemic index diet and particpate regularly in an aerobic  exercise activity.  We should check an A1c in 6 months.     Lab Results  Component Value Date   HGBA1C 6.3 07/19/2022

## 2022-07-21 ENCOUNTER — Ambulatory Visit: Payer: Medicare Other

## 2022-07-21 NOTE — Progress Notes (Signed)
Patient arrived for right ear cerumen removal. Patient's right ear was flushed out and she was ready to go. Patient tolerated the ear flush well.

## 2022-08-01 DIAGNOSIS — R42 Dizziness and giddiness: Secondary | ICD-10-CM | POA: Diagnosis not present

## 2022-08-01 DIAGNOSIS — D37030 Neoplasm of uncertain behavior of the parotid salivary glands: Secondary | ICD-10-CM | POA: Diagnosis not present

## 2022-08-01 DIAGNOSIS — H903 Sensorineural hearing loss, bilateral: Secondary | ICD-10-CM | POA: Diagnosis not present

## 2022-08-01 DIAGNOSIS — E860 Dehydration: Secondary | ICD-10-CM | POA: Diagnosis not present

## 2022-08-01 DIAGNOSIS — H6123 Impacted cerumen, bilateral: Secondary | ICD-10-CM | POA: Diagnosis not present

## 2022-08-12 ENCOUNTER — Other Ambulatory Visit: Payer: Self-pay | Admitting: Internal Medicine

## 2022-08-20 ENCOUNTER — Other Ambulatory Visit: Payer: Self-pay | Admitting: Family

## 2022-08-20 DIAGNOSIS — E785 Hyperlipidemia, unspecified: Secondary | ICD-10-CM

## 2022-09-05 ENCOUNTER — Ambulatory Visit (INDEPENDENT_AMBULATORY_CARE_PROVIDER_SITE_OTHER): Payer: Medicare Other | Admitting: Urology

## 2022-09-05 VITALS — BP 127/77 | HR 76 | Ht 67.0 in | Wt 238.0 lb

## 2022-09-05 DIAGNOSIS — N3281 Overactive bladder: Secondary | ICD-10-CM | POA: Diagnosis not present

## 2022-09-05 DIAGNOSIS — N3942 Incontinence without sensory awareness: Secondary | ICD-10-CM

## 2022-09-05 DIAGNOSIS — N3941 Urge incontinence: Secondary | ICD-10-CM | POA: Diagnosis not present

## 2022-09-05 MED ORDER — MIRABEGRON ER 25 MG PO TB24: 25.0000 mg | ORAL_TABLET | Freq: Every day | ORAL | 11 refills | Status: DC

## 2022-09-05 NOTE — Progress Notes (Signed)
09/05/2022 1:56 PM   Monica [name] 10-13-1935 [id number]  Referring provider: Sherlene Shams, MD [address] [place],  [place] [address]  Chief Complaint  Patient presents with   Establish Care   Over Active Bladder    HPI: I was consulted to assess the patient is urinary incontinence.  She is not a strong historian but I think she has urge incontinence but also leakage without awareness into her 6 pads or more per day that are moderately wet.  She has moderately severe bedwetting.  She voids every 1-2 hours and gets up least twice a night  She was nonspecific in terms of symptoms but she thinks she gets 5 or 6 infections a year.  In 2022 she had a negative culture, Streptococcus in the urine, and 1 positive culture.  She was given trospium but it was not filled due to the potential of a drug drug interaction.  She had a previous stroke.  She uses a cane.  No history of kidney stones or bladder surgery.  Normal CT scan of kidneys other than bilateral renal cyst in 2021   PMH: Past Medical History:  Diagnosis Date   Abnormal gait 09/22/2021   Abnormal weight gain 02/24/2021   Allergic rhinitis    Anxiety    Benign neoplasm of breast 2013   Bilateral shoulder pain 01/13/2020   Cardiac arrhythmia    Cardiomegaly    Cataract    Cervicalgia 01/13/2020   CHF (congestive heart failure) (HCC)    Chronic back pain 12/26/2018   Severe stenosis L spine       Chronic cough 04/15/2016   Chronic knee pain 07/22/2020   Chronic left shoulder pain 06/07/2019   Colon cancer screening 02/24/2021   Compulsive eating patterns    Compulsive overeating    Compulsive overeating 01/01/2015   Degenerative lumbar spinal stenosis 09/20/2017   Depression    Depression with anxiety 12/21/2006   Diastolic heart failure (HCC)    LVH with free wall thickness 1.4 cm EF 60% Dr. Katrinka Blazing    Diffuse cystic mastopathy 2013   Diverticulosis of large intestine without perforation or  abscess without bleeding    Dysuria 06/03/2020   Edema    Enlarged clavicle 12/29/2021   Epistaxis    noted 12/20/18    Epistaxis 01/07/2019   Essential hypertension 12/21/2006   Generalized abdominal pain 01/03/2019   GI bleeding    in ~2018    Gout    Grief 01/13/2020   Hiatal hernia    HTN (hypertension)    Hyperlipidemia    Intertrigo 10/01/2020   Iron deficiency anemia 01/03/2019   Kidney cysts 01/13/2020   Leg DVT (deep venous thromboembolism), acute, bilateral (HCC) 12/26/2018   Noted [place] legs 09/02/18 b/l       Lower GI bleed 10/03/2016   Lump or mass in breast 2012   Memory loss 03/06/2019   Middle cerebral artery embolism, 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   OA (osteoarthritis) of knee    with injections   Obesity    OSA (obstructive sleep apnea)    not on cpap   Other abnormal glucose    Partial nontraumatic rupture of right rotator cuff 09/22/2015   Pelvic floor dysfunction 06/06/2019   Peptic stricture of esophagus    Personal history of tobacco use, presenting hazards to health    Rectal bleeding 02/24/2021   Recurrent falls 01/13/2020   Sciatica    Seborrheic keratoses 04/15/2016   Sinus infection 2010  Spinal stenosis    lumbar    Status post bilateral knee replacements 05/04/2016   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   09/01/2018   Established GNA with thrombectomy intracranial in hospital    Stroke [facility])    Unspecified sleep apnea    Urge incontinence of urine 09/09/2011   Urine frequency 02/17/2021   UTI (urinary tract infection)    e coli 06/2020    Surgical History: Past Surgical History:  Procedure Laterality Date   adenosine cardiolite  1/08   low risk    APPENDECTOMY     BREAST BIOPSY     02/28/11 negative   BREAST LUMPECTOMY     right x1 ('89) left x2 ('70s, '90)   CARDIAC CATHETERIZATION  2001   normal per pt   COLONOSCOPY  11/02   diverticulosis   COLONOSCOPY  9/06   diverticulosis, hemorhoids   COLONOSCOPY N/A 10/01/2016   Procedure:  COLONOSCOPY;  Surgeon: Iva Boop, MD;  Location: [facility];  Service: Endoscopy;  Laterality: N/A;   dexa  3/02   normal   EYE SURGERY  2012   cataract   IR CT HEAD LTD  09/01/2018   IR PERCUTANEOUS ART THROMBECTOMY/INFUSION INTRACRANIAL INC DIAG ANGIO  09/01/2018   JOINT REPLACEMENT     knee b/l 2011   knee replacement Right 9/11   R. Dr. Priscille Kluver   RADIOLOGY WITH ANESTHESIA N/A 09/01/2018   Procedure: IR WITH ANESTHESIA;  Surgeon: Julieanne Cotton, MD;  Location: [facility] OR;  Service: Radiology;  Laterality: N/A;   sleep study  5/05   TONSILLECTOMY     TOTAL ABDOMINAL HYSTERECTOMY     fibroids, age of 58   TOTAL KNEE ARTHROPLASTY Left 2012   US TRANSVAGINAL PELVIC MODIFIED  2000, 2001    Home Medications:  Allergies as of 09/05/2022       Reactions   Lisinopril Other (See Comments)   REACTION: dizziness        Medication List        Accurate as of September 05, 2022  1:56 PM. If you have any questions, ask your nurse or doctor.          STOP taking these medications    trospium 20 MG tablet Commonly known as: SANCTURA       TAKE these medications    allopurinol 100 MG tablet Commonly known as: ZYLOPRIM TAKE 2 TABLETS BY MOUTH EVERY DAY   ALPRAZolam 0.25 MG tablet Commonly known as: XANAX Take 1 tablet (0.25 mg total) by mouth 2 (two) times daily as needed for anxiety.   amLODipine 5 MG tablet Commonly known as: NORVASC Take 1 tablet (5 mg total) by mouth daily. Take additional 5 mg in pm if BP>130/>80   atorvastatin 20 MG tablet Commonly known as: LIPITOR Take 20 mg by mouth at bedtime.   digoxin 0.125 MG tablet Commonly known as: LANOXIN Take 0.5 tablets (0.0625 mg total) by mouth daily.   DULoxetine 30 MG capsule Commonly known as: CYMBALTA TAKE 1 CAPSULE BY MOUTH EVERY DAY   Eliquis 5 MG Tabs tablet Generic drug: apixaban TAKE 1 TABLET BY MOUTH TWICE A DAY   furosemide 40 MG tablet Commonly known as: LASIX Take 1 tablet (40 mg total)  by mouth daily.   gabapentin 300 MG capsule Commonly known as: NEURONTIN Take 1 tablet in the morning and 2 tablets at night   hydrocortisone 2.5 % cream Apply topically 2 (two) times daily. What changed: additional instructions   Magnesium  250 MG Tabs Take 250 mg by mouth daily.   metoprolol succinate 100 MG 24 hr tablet Commonly known as: TOPROL-XL Take with or immediately following a meal.   nystatin cream Commonly known as: MYCOSTATIN Apply 1 Application topically 3 (three) times daily. Prn right back   potassium chloride SA 20 MEQ tablet Commonly known as: Klor-Con M20 Take 1 tablet (20 mEq total) by mouth daily. TAKE TABLET BY MOUTH AS DIRECTED   triamcinolone cream 0.1 % Commonly known as: KENALOG APPLY 1 APPLICATION TOPICALLY 2 (TWO) TIMES DAILY. TO PATCHY RASH        Allergies:  Allergies  Allergen Reactions   Lisinopril Other (See Comments)    REACTION: dizziness    Family History: Family History  Problem Relation Age of Onset   Hypertension Mother        (a lot of animosity in their relationship)   Heart failure Mother    Stroke Father    Breast cancer Sister    Cancer Sister        breast cancer   Prostate cancer Brother    Cancer Brother        prostate    Hepatitis Brother        Hep C; alcoholism (terminal)   Drug abuse Brother        died 68   Other Brother        drug addiction   Gout Other        whole family     Social History:  reports that she has quit smoking. Her smoking use included cigarettes. She has a 10 pack-year smoking history. She has never used smokeless tobacco. She reports current alcohol use. She reports that she does not use drugs.  ROS:                                        Physical Exam: BP 127/77   Pulse 76   Ht 5\' 7"  (1.702 m)   Wt 108 kg   BMI 37.28 kg/m   Constitutional:  Alert and oriented, No acute distress. HEENT:  AT, moist mucus membranes.  Trachea midline, no  masses.  Laboratory Data: Lab Results  Component Value Date   WBC 5.8 04/19/2022   HGB 12.8 04/19/2022   HCT 39.4 04/19/2022   MCV 87.7 04/19/2022   PLT 253.0 04/19/2022    Lab Results  Component Value Date   CREATININE 0.91 07/19/2022    No results found for: "PSA"  No results found for: "TESTOSTERONE"  Lab Results  Component Value Date   HGBA1C 6.3 07/19/2022    Urinalysis    Component Value Date/Time   COLORURINE YELLOW 08/07/2020 1639   APPEARANCEUR CLEAR 08/07/2020 1639   LABSPEC 1.024 08/07/2020 1639   PHURINE 5.5 08/07/2020 1639   GLUCOSEU NEGATIVE 08/07/2020 1639   GLUCOSEU Color Interference (A) 06/03/2020 0933   HGBUR NEGATIVE 08/07/2020 1639   BILIRUBINUR neg 03/07/2022 1636   KETONESUR NEGATIVE 08/07/2020 1639   PROTEINUR Negative 03/07/2022 1636   PROTEINUR TRACE (A) 08/07/2020 1639   UROBILINOGEN 0.2 03/07/2022 1636   UROBILINOGEN Color Interference (A) 06/03/2020 0933   NITRITE neg 03/07/2022 1636   NITRITE NEGATIVE 08/07/2020 1639   LEUKOCYTESUR Negative 03/07/2022 1636   LEUKOCYTESUR NEGATIVE 08/07/2020 1639    Pertinent Imaging: Able to leave urine sample  Assessment & Plan: Patient will be treated as an overactive  bladder with bedwetting and urge incontinence and leakage with awareness.  She has a functional component.  She is likely getting recurrent urinary tract infections.  I did not order an ultrasound but may in the future.  Come back on Myrbetriq samples and prescriptions for pelvic exam and cystoscopy and stress test in 6 weeks.  Check residual then.  Likely will not be ordering urodynamics  1. Overactive bladder  - Urinalysis, Complete   No follow-ups on file.  Martina Sinner, MD  [facility] [address] [place], [place] [address] [phone]

## 2022-09-05 NOTE — Addendum Note (Signed)
Addended by: Consuella Lose on: 09/05/2022 04:11 PM   Modules accepted: Orders

## 2022-09-06 ENCOUNTER — Other Ambulatory Visit: Payer: Self-pay | Admitting: Internal Medicine

## 2022-09-06 DIAGNOSIS — M5416 Radiculopathy, lumbar region: Secondary | ICD-10-CM

## 2022-09-06 MED ORDER — APIXABAN 5 MG PO TABS: 5.0000 mg | ORAL_TABLET | Freq: Two times a day (BID) | ORAL | 2 refills | Status: DC

## 2022-09-06 MED ORDER — ATORVASTATIN CALCIUM 20 MG PO TABS: 20.0000 mg | ORAL_TABLET | Freq: Every day | ORAL | 3 refills | Status: DC

## 2022-09-06 NOTE — Addendum Note (Signed)
Addended by: Sandy Salaam on: 09/06/2022 01:47 PM   Modules accepted: Orders

## 2022-09-06 NOTE — Telephone Encounter (Signed)
Refilled: 03/18/2022 Last OV: 07/19/2022 Next OV: not scheduled

## 2022-09-06 NOTE — Telephone Encounter (Signed)
Prescription Request  09/06/2022  LOV: 07/19/2022  What is the name of the medication or equipment? atorvastatin (LIPITOR) 20 MG tablet and gabapentin (NEURONTIN) 300 MG capsule and  ELIQUIS 5 MG TABS tablet and potassium chloride SA (KLOR-CON M20) 20 MEQ tablet    Have you contacted your pharmacy to request a refill? Yes   Which pharmacy would you like this sent to?   [id number], [place] - [address] [place] [address] Phone: [phone] Fax: [phone]    Patient notified that their request is being sent to the clinical staff for review and that they should receive a response within 2 business days.   Please advise at [facility] [phone]

## 2022-09-07 MED ORDER — GABAPENTIN 300 MG PO CAPS
ORAL_CAPSULE | ORAL | Status: DC
Start: 2022-09-07 — End: 2022-10-10

## 2022-10-03 ENCOUNTER — Encounter: Payer: Self-pay | Admitting: Gastroenterology

## 2022-10-03 ENCOUNTER — Ambulatory Visit (INDEPENDENT_AMBULATORY_CARE_PROVIDER_SITE_OTHER): Payer: Medicare Other | Admitting: Gastroenterology

## 2022-10-03 ENCOUNTER — Other Ambulatory Visit: Payer: Self-pay

## 2022-10-03 VITALS — BP 138/78 | HR 76 | Temp 98.3°F | Wt 238.0 lb

## 2022-10-03 DIAGNOSIS — R1319 Other dysphagia: Secondary | ICD-10-CM

## 2022-10-03 NOTE — Progress Notes (Unsigned)
Wyline Mood MD, MRCP(U.K) [address]  [address]  [place], [place] [address]  Main: [phone]  Fax: [phone]   Gastroenterology Consultation  Referring Provider:     Allegra Grana, FNP Primary Care Physician:  Sherlene Shams, MD Primary Gastroenterologist:  Dr. Wyline Mood  Reason for Consultation:     Dysphagia         HPI:   Monica [name] is a 87 y.o. y/o female referred for consultation & management  by Dr. Darrick Huntsman, Mar Daring, MD.   She has been referred for dysphagia.  06/29/2022 CT scan soft tissue neck with contrast shows no focal lesion to explain symptoms increase in size of hypodense lesion near the superior aspect of the right parotid gland.  06/24/2022 barium swallow with tablet shows Schatzki's ring with esophageal spasm esophageal dysmotility and small hiatal hernia. Has had difficulty swallowing solids and liquids for over a year gradually progressed feels food gets stuck in the throat.  Has had some history of regurgitation does not take any medications for the same.  She does snack after midnight large quantities of kettle chips.  No other symptoms. Past Medical History:  Diagnosis Date   Abnormal gait 09/22/2021   Abnormal weight gain 02/24/2021   Allergic rhinitis    Anxiety    Benign neoplasm of breast 2013   Bilateral shoulder pain 01/13/2020   Cardiac arrhythmia    Cardiomegaly    Cataract    Cervicalgia 01/13/2020   CHF (congestive heart failure) (HCC)    Chronic back pain 12/26/2018   Severe stenosis L spine       Chronic cough 04/15/2016   Chronic knee pain 07/22/2020   Chronic left shoulder pain 06/07/2019   Colon cancer screening 02/24/2021   Compulsive eating patterns    Compulsive overeating    Compulsive overeating 01/01/2015   Degenerative lumbar spinal stenosis 09/20/2017   Depression    Depression with anxiety 12/21/2006   Diastolic heart failure (HCC)    LVH with free wall thickness 1.4 cm EF 60% Dr. Katrinka Blazing     Diffuse cystic mastopathy 2013   Diverticulosis of large intestine without perforation or abscess without bleeding    Dysuria 06/03/2020   Edema    Enlarged clavicle 12/29/2021   Epistaxis    noted 12/20/18    Epistaxis 01/07/2019   Essential hypertension 12/21/2006   Generalized abdominal pain 01/03/2019   GI bleeding    in ~2018    Gout    Grief 01/13/2020   Hiatal hernia    HTN (hypertension)    Hyperlipidemia    Intertrigo 10/01/2020   Iron deficiency anemia 01/03/2019   Kidney cysts 01/13/2020   Leg DVT (deep venous thromboembolism), acute, bilateral (HCC) 12/26/2018   Noted [place] legs 09/02/18 b/l       Lower GI bleed 10/03/2016   Lump or mass in breast 2012   Memory loss 03/06/2019   Middle cerebral artery embolism, 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   OA (osteoarthritis) of knee    with injections   Obesity    OSA (obstructive sleep apnea)    not on cpap   Other abnormal glucose    Partial nontraumatic rupture of right rotator cuff 09/22/2015   Pelvic floor dysfunction 06/06/2019   Peptic stricture of esophagus    Personal history of tobacco use, presenting hazards to health    Rectal bleeding 02/24/2021   Recurrent falls 01/13/2020   Sciatica    Seborrheic keratoses 04/15/2016  Sinus infection 2010   Spinal stenosis    lumbar    Status post bilateral knee replacements 05/04/2016   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   09/01/2018   Established GNA with thrombectomy intracranial in hospital    Stroke [facility])    Unspecified sleep apnea    Urge incontinence of urine 09/09/2011   Urine frequency 02/17/2021   UTI (urinary tract infection)    e coli 06/2020    Past Surgical History:  Procedure Laterality Date   adenosine cardiolite  1/08   low risk    APPENDECTOMY     BREAST BIOPSY     02/28/11 negative   BREAST LUMPECTOMY     right x1 ('89) left x2 ('70s, '90)   CARDIAC CATHETERIZATION  2001   normal per pt   COLONOSCOPY  11/02   diverticulosis   COLONOSCOPY  9/06    diverticulosis, hemorhoids   COLONOSCOPY N/A 10/01/2016   Procedure: COLONOSCOPY;  Surgeon: Iva Boop, MD;  Location: [facility];  Service: Endoscopy;  Laterality: N/A;   dexa  3/02   normal   EYE SURGERY  2012   cataract   IR CT HEAD LTD  09/01/2018   IR PERCUTANEOUS ART THROMBECTOMY/INFUSION INTRACRANIAL INC DIAG ANGIO  09/01/2018   JOINT REPLACEMENT     knee b/l 2011   knee replacement Right 9/11   R. Dr. Priscille Kluver   RADIOLOGY WITH ANESTHESIA N/A 09/01/2018   Procedure: IR WITH ANESTHESIA;  Surgeon: Julieanne Cotton, MD;  Location: [facility] OR;  Service: Radiology;  Laterality: N/A;   sleep study  5/05   TONSILLECTOMY     TOTAL ABDOMINAL HYSTERECTOMY     fibroids, age of 53   TOTAL KNEE ARTHROPLASTY Left 2012   US TRANSVAGINAL PELVIC MODIFIED  2000, 2001    Prior to Admission medications   Medication Sig Start Date End Date Taking? Authorizing Provider  allopurinol (ZYLOPRIM) 100 MG tablet TAKE 2 TABLETS BY MOUTH EVERY DAY 05/25/22   Sherlene Shams, MD  ALPRAZolam Prudy Feeler) 0.25 MG tablet Take 1 tablet (0.25 mg total) by mouth 2 (two) times daily as needed for anxiety. 09/03/21   McLean-Scocuzza, Pasty Spillers, MD  amLODipine (NORVASC) 5 MG tablet Take 1 tablet (5 mg total) by mouth daily. Take additional 5 mg in pm if BP>130/>80 07/19/22   Sherlene Shams, MD  apixaban (ELIQUIS) 5 MG TABS tablet Take 1 tablet (5 mg total) by mouth 2 (two) times daily. 09/06/22   Sherlene Shams, MD  atorvastatin (LIPITOR) 20 MG tablet Take 1 tablet (20 mg total) by mouth at bedtime. 09/06/22   Sherlene Shams, MD  digoxin (LANOXIN) 0.125 MG tablet Take 0.5 tablets (0.0625 mg total) by mouth daily. 03/22/22   Iran Ouch, MD  DULoxetine (CYMBALTA) 30 MG capsule TAKE 1 CAPSULE BY MOUTH EVERY DAY 04/19/22   Dana Allan, MD  furosemide (LASIX) 40 MG tablet Take 1 tablet (40 mg total) by mouth daily. 04/21/21   McLean-Scocuzza, Pasty Spillers, MD  gabapentin (NEURONTIN) 300 MG capsule Take 1 tablet in the morning and 2  tablets at night 09/07/22   Sherlene Shams, MD  hydrocortisone 2.5 % cream Apply topically 2 (two) times daily. Patient taking differently: Apply topically 2 (two) times daily. As needed 10/01/20   McLean-Scocuzza, Pasty Spillers, MD  Magnesium 250 MG TABS Take 250 mg by mouth daily.    [provider]  methylPREDNISolone (MEDROL) 4 MG tablet Take 4 mg by mouth daily.  [provider]  metoprolol succinate (TOPROL-XL) 100 MG 24 hr tablet Take with or immediately following a meal. 07/19/22   Sherlene Shams, MD  mirabegron ER (MYRBETRIQ) 25 MG TB24 tablet Take 1 tablet (25 mg total) by mouth daily. 09/05/22   Alfredo Martinez, MD  nystatin cream (MYCOSTATIN) Apply 1 Application topically 3 (three) times daily. Prn right back 06/17/22   Sherlene Shams, MD  potassium chloride SA (KLOR-CON M20) 20 MEQ tablet Take 1 tablet (20 mEq total) by mouth daily. TAKE TABLET BY MOUTH AS DIRECTED 05/12/22   Iran Ouch, MD  triamcinolone cream (KENALOG) 0.1 % APPLY 1 APPLICATION TOPICALLY 2 (TWO) TIMES DAILY. TO PATCHY RASH 07/08/22   Sherlene Shams, MD    Family History  Problem Relation Age of Onset   Hypertension Mother        (a lot of animosity in their relationship)   Heart failure Mother    Stroke Father    Breast cancer Sister    Cancer Sister        breast cancer   Prostate cancer Brother    Cancer Brother        prostate    Hepatitis Brother        Hep C; alcoholism (terminal)   Drug abuse Brother        died 60   Other Brother        drug addiction   Gout Other        whole family      Social History   Tobacco Use   Smoking status: Former    Current packs/day: 0.50    Average packs/day: 0.5 packs/day for 20.0 years (10.0 ttl pk-yrs)    Types: Cigarettes   Smokeless tobacco: Never   Tobacco comments:    quit approx. 20 years ago  Vaping Use   Vaping status: Never Used  Substance Use Topics   Alcohol use: Yes    Alcohol/week: 0.0 standard drinks of alcohol     Comment: wine occasional   Drug use: No    Allergies as of 10/03/2022 - Review Complete 10/03/2022  Allergen Reaction Noted   Lisinopril Other (See Comments) 03/28/2016    Review of Systems:    All systems reviewed and negative except where noted in HPI.   Physical Exam:  BP 138/78   Pulse 76   Temp 98.3 F (36.8 C) (Oral)   Wt 238 lb (108 kg)   BMI 37.28 kg/m  No LMP recorded. Patient has had a hysterectomy. Psych:  Alert and cooperative. Normal mood and affect. General:   Alert,  Well-developed, well-nourished, pleasant and cooperative in NAD Head:  Normocephalic and atraumatic. Eyes:  Sclera clear, no icterus.   Conjunctiva pink.  Neurologic:  Alert and oriented x3;  grossly normal neurologically. Psych:  Alert and cooperative. Normal mood and affect.  Imaging Studies: No results found.  Assessment and Plan:   Monica [name] is a 87 y.o. y/o female has been referred for dysphagia.  Recent CT scan of the neck showed enlargement of the parotid gland and barium swallow with tablet showed possible evidence of a Schatzki's ring which would be amenable to endoscopic dilation.  There is also some esophageal dysmotility noted.  Plan 1.  EGD with empiric dilation of the GE junction.  Will need Eliquis holding instructions 2.  Trial of Pepcid 40 mg once a day at night 3.  Advised to stop eating snacks after midnight as it can  worsen her reflux symptoms.  I have discussed alternative options, risks & benefits,  which include, but are not limited to, bleeding, infection, perforation,respiratory complication & drug reaction.  The patient agrees with this plan & written consent will be obtained.     Follow up in as needed  Dr Wyline Mood MD,MRCP(U.K)

## 2022-10-03 NOTE — Patient Instructions (Addendum)
Please purchase Pepcid 40 MG daily at bedtime at your local pharmacy.  Please stop eating snacks after midnight as it can worsen your reflux symptoms.   We will be contacting you to let you know when to hold your Eliquis as soon as your cardiologist lets [place] know.

## 2022-10-06 ENCOUNTER — Encounter: Payer: Self-pay | Admitting: Internal Medicine

## 2022-10-06 DIAGNOSIS — D6869 Other thrombophilia: Secondary | ICD-10-CM | POA: Insufficient documentation

## 2022-10-07 NOTE — Telephone Encounter (Signed)
Error

## 2022-10-10 ENCOUNTER — Encounter: Payer: Self-pay | Admitting: Urology

## 2022-10-10 ENCOUNTER — Other Ambulatory Visit: Payer: Self-pay | Admitting: Internal Medicine

## 2022-10-10 DIAGNOSIS — M5416 Radiculopathy, lumbar region: Secondary | ICD-10-CM

## 2022-10-10 MED ORDER — GABAPENTIN 300 MG PO CAPS
ORAL_CAPSULE | ORAL | 5 refills | Status: DC
Start: 2022-10-10 — End: 2023-06-20

## 2022-10-10 NOTE — Telephone Encounter (Signed)
Refilled: 09/07/2022 Last OV: 07/19/2022 Next OV: not scheduled

## 2022-10-10 NOTE — Telephone Encounter (Signed)
Prescription Request  10/10/2022  LOV: 07/19/2022  What is the name of the medication or equipment? gabapentin (NEURONTIN) 300 MG capsule  Have you contacted your pharmacy to request a refill? Yes   Which pharmacy would you like this sent to?   [id number],  - [address] [place] [address] Phone: [phone] Fax: [phone]    Patient notified that their request is being sent to the clinical staff for review and that they should receive a response within 2 business days.   Please advise at Mobile [phone] (mobile)

## 2022-10-20 ENCOUNTER — Telehealth: Payer: Self-pay | Admitting: Gastroenterology

## 2022-10-20 NOTE — Telephone Encounter (Signed)
Patient called in to find out the name of the meds she is supposed to be taking, she accidental throw it away.

## 2022-10-20 NOTE — Telephone Encounter (Signed)
Patient called back and I was able to speak with her. Patient wanted to know what was the medication that Dr. Tobi Bastos recommended for her to start taking. I went over his office notes and he stated for the patient to start taking Pepcid 40 MG at bedtime. Patient was happy to hear and understood what she needed to do and what to purchase. Patient had no further questions.

## 2022-10-31 ENCOUNTER — Ambulatory Visit (INDEPENDENT_AMBULATORY_CARE_PROVIDER_SITE_OTHER): Payer: Medicare Other | Admitting: Urology

## 2022-10-31 VITALS — BP 130/79 | HR 87 | Ht 67.0 in | Wt 238.0 lb

## 2022-10-31 DIAGNOSIS — Z87440 Personal history of urinary (tract) infections: Secondary | ICD-10-CM

## 2022-10-31 DIAGNOSIS — N3942 Incontinence without sensory awareness: Secondary | ICD-10-CM

## 2022-10-31 DIAGNOSIS — N3941 Urge incontinence: Secondary | ICD-10-CM

## 2022-10-31 DIAGNOSIS — N3281 Overactive bladder: Secondary | ICD-10-CM

## 2022-10-31 LAB — URINALYSIS, COMPLETE
Bilirubin, UA: NEGATIVE
Glucose, UA: NEGATIVE
Ketones, UA: NEGATIVE
Leukocytes,UA: NEGATIVE
Nitrite, UA: NEGATIVE
Protein,UA: NEGATIVE
Specific Gravity, UA: 1.025 (ref 1.005–1.030)
Urobilinogen, Ur: 1 mg/dL (ref 0.2–1.0)
pH, UA: 5.5 (ref 5.0–7.5)

## 2022-10-31 LAB — MICROSCOPIC EXAMINATION: Epithelial Cells (non renal): >10 /HPF — AB (ref 0–10)

## 2022-10-31 MED ORDER — GEMTESA 75 MG PO TABS: 75.0000 mg | ORAL_TABLET | Freq: Every day | ORAL | 11 refills | Status: DC

## 2022-10-31 MED ORDER — GEMTESA 75 MG PO TABS: 75.0000 mg | ORAL_TABLET | Freq: Every day | ORAL | Status: DC

## 2022-10-31 NOTE — Progress Notes (Signed)
In and Out Catheterization  Patient is present today for a I & O catheterization due to UTI. Patient was cleaned and prepped in a sterile fashion with betadine . A 14FR cath was inserted no complications were noted , 40 ml of urine return was noted, urine was dark yellow, clear  in color. A clean urine sample was collected for UA/ Culture. Bladder was drained  And catheter was removed with out difficulty.    Performed by: Randa Lynn, RMA  Follow up/ Additional notes: 6 weeks for cysto

## 2022-10-31 NOTE — Progress Notes (Signed)
10/31/2022 1:45 PM   Monica [name] 1935/12/06 [id number]  Referring provider: Sherlene Shams, MD [address] [place],  [place] [address]  Chief Complaint  Patient presents with   Follow-up   Over Active Bladder    HPI: I was consulted to assess the patient is urinary incontinence.  She is not a strong historian but I think she has urge incontinence but also leakage without awareness into her 6 pads or more per day that are moderately wet.  She has moderately severe bedwetting.  She voids every 1-2 hours and gets up least twice a night   She was nonspecific in terms of symptoms but she thinks she gets 5 or 6 infections a year.  In 2022 she had a negative culture, Streptococcus in the urine, and 1 positive culture.   She was given trospium but it was not filled due to the potential of a drug drug interaction.  She had a previous stroke.  She uses a cane.    Patient will be treated as an overactive bladder with bedwetting and urge incontinence and leakage with awareness.  She has a functional component.  She is likely getting recurrent urinary tract infections.  I did not order an ultrasound but may in the future.  Come back on Myrbetriq samples and prescriptions for pelvic exam and cystoscopy and stress test in 6 weeks.  Check residual then.  Likely will not be ordering urodynamics   Today Frequency stable.  No recent urine culture.  Conversation was not crystal-clear but it appears that she took Myrbetriq 25 mg samples and they really did not help much.  When she tried to fill it there may have been a potential for drug-drug interaction.  She never took [place] since pharmacist it causes hypertension.  I am not convinced that she had any UTI symptoms today but she had vague feelings in the pelvic area   PMH: Past Medical History:  Diagnosis Date   Abnormal gait 09/22/2021   Abnormal weight gain 02/24/2021   Allergic rhinitis    Anxiety    Benign neoplasm of  breast 2013   Bilateral shoulder pain 01/13/2020   Cardiac arrhythmia    Cardiomegaly    Cataract    Cervicalgia 01/13/2020   CHF (congestive heart failure) (HCC)    Chronic back pain 12/26/2018   Severe stenosis L spine       Chronic cough 04/15/2016   Chronic knee pain 07/22/2020   Chronic left shoulder pain 06/07/2019   Colon cancer screening 02/24/2021   Compulsive eating patterns    Compulsive overeating    Compulsive overeating 01/01/2015   Degenerative lumbar spinal stenosis 09/20/2017   Depression    Depression with anxiety 12/21/2006   Diastolic heart failure (HCC)    LVH with free wall thickness 1.4 cm EF 60% Dr. Katrinka Blazing    Diffuse cystic mastopathy 2013   Diverticulosis of large intestine without perforation or abscess without bleeding    Dysuria 06/03/2020   Edema    Enlarged clavicle 12/29/2021   Epistaxis    noted 12/20/18    Epistaxis 01/07/2019   Essential hypertension 12/21/2006   Generalized abdominal pain 01/03/2019   GI bleeding    in ~2018    Gout    Grief 01/13/2020   Hiatal hernia    HTN (hypertension)    Hyperlipidemia    Intertrigo 10/01/2020   Iron deficiency anemia 01/03/2019   Kidney cysts 01/13/2020   Leg DVT (deep venous  thromboembolism), acute, bilateral (HCC) 12/26/2018   Noted [place] legs 09/02/18 b/l       Lower GI bleed 10/03/2016   Lump or mass in breast 2012   Memory loss 03/06/2019   Middle cerebral artery embolism, 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   OA (osteoarthritis) of knee    with injections   Obesity    OSA (obstructive sleep apnea)    not on cpap   Other abnormal glucose    Partial nontraumatic rupture of right rotator cuff 09/22/2015   Pelvic floor dysfunction 06/06/2019   Peptic stricture of esophagus    Personal history of tobacco use, presenting hazards to health    Rectal bleeding 02/24/2021   Recurrent falls 01/13/2020   Sciatica    Seborrheic keratoses 04/15/2016   Sinus infection 2010   Spinal stenosis    lumbar    Status  post bilateral knee replacements 05/04/2016   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   09/01/2018   Established GNA with thrombectomy intracranial in hospital    Stroke [facility])    Unspecified sleep apnea    Urge incontinence of urine 09/09/2011   Urine frequency 02/17/2021   UTI (urinary tract infection)    e coli 06/2020    Surgical History: Past Surgical History:  Procedure Laterality Date   adenosine cardiolite  1/08   low risk    APPENDECTOMY     BREAST BIOPSY     02/28/11 negative   BREAST LUMPECTOMY     right x1 ('89) left x2 ('70s, '90)   CARDIAC CATHETERIZATION  2001   normal per pt   COLONOSCOPY  11/02   diverticulosis   COLONOSCOPY  9/06   diverticulosis, hemorhoids   COLONOSCOPY N/A 10/01/2016   Procedure: COLONOSCOPY;  Surgeon: Iva Boop, MD;  Location: [facility];  Service: Endoscopy;  Laterality: N/A;   dexa  3/02   normal   EYE SURGERY  2012   cataract   IR CT HEAD LTD  09/01/2018   IR PERCUTANEOUS ART THROMBECTOMY/INFUSION INTRACRANIAL INC DIAG ANGIO  09/01/2018   JOINT REPLACEMENT     knee b/l 2011   knee replacement Right 9/11   R. Dr. Priscille Kluver   RADIOLOGY WITH ANESTHESIA N/A 09/01/2018   Procedure: IR WITH ANESTHESIA;  Surgeon: Julieanne Cotton, MD;  Location: [facility] OR;  Service: Radiology;  Laterality: N/A;   sleep study  5/05   TONSILLECTOMY     TOTAL ABDOMINAL HYSTERECTOMY     fibroids, age of 70   TOTAL KNEE ARTHROPLASTY Left 2012   US TRANSVAGINAL PELVIC MODIFIED  2000, 2001    Home Medications:  Allergies as of 10/31/2022       Reactions   Lisinopril Other (See Comments)   REACTION: dizziness        Medication List        Accurate as of October 31, 2022  1:45 PM. If you have any questions, ask your nurse or doctor.          STOP taking these medications    digoxin 0.125 MG tablet Commonly known as: LANOXIN   mirabegron ER 25 MG Tb24 tablet Commonly known as: MYRBETRIQ       TAKE these medications    allopurinol 100 MG  tablet Commonly known as: ZYLOPRIM TAKE 2 TABLETS BY MOUTH EVERY DAY   ALPRAZolam 0.25 MG tablet Commonly known as: XANAX Take 1 tablet (0.25 mg total) by mouth 2 (two) times daily as needed for anxiety.   amLODipine 5 MG tablet Commonly known  as: NORVASC Take 1 tablet (5 mg total) by mouth daily. Take additional 5 mg in pm if BP>130/>80   apixaban 5 MG Tabs tablet Commonly known as: Eliquis Take 1 tablet (5 mg total) by mouth 2 (two) times daily.   atorvastatin 20 MG tablet Commonly known as: LIPITOR Take 1 tablet (20 mg total) by mouth at bedtime.   DULoxetine 30 MG capsule Commonly known as: CYMBALTA TAKE 1 CAPSULE BY MOUTH EVERY DAY   furosemide 40 MG tablet Commonly known as: LASIX Take 1 tablet (40 mg total) by mouth daily.   gabapentin 300 MG capsule Commonly known as: NEURONTIN Take 1 tablet in the morning and 2 tablets at nightTake 1 tablet in the morning and 2 tablets at night   hydrocortisone 2.5 % cream Apply topically 2 (two) times daily. What changed: additional instructions   Magnesium 250 MG Tabs Take 250 mg by mouth daily.   methylPREDNISolone 4 MG tablet Commonly known as: MEDROL Take 4 mg by mouth daily.   metoprolol succinate 100 MG 24 hr tablet Commonly known as: TOPROL-XL Take with or immediately following a meal.   nystatin cream Commonly known as: MYCOSTATIN Apply 1 Application topically 3 (three) times daily. Prn right back   potassium chloride SA 20 MEQ tablet Commonly known as: Klor-Con M20 Take 1 tablet (20 mEq total) by mouth daily. TAKE TABLET BY MOUTH AS DIRECTED   triamcinolone cream 0.1 % Commonly known as: KENALOG APPLY 1 APPLICATION TOPICALLY 2 (TWO) TIMES DAILY. TO PATCHY RASH        Allergies:  Allergies  Allergen Reactions   Lisinopril Other (See Comments)    REACTION: dizziness    Family History: Family History  Problem Relation Age of Onset   Hypertension Mother        (a lot of animosity in their  relationship)   Heart failure Mother    Stroke Father    Breast cancer Sister    Cancer Sister        breast cancer   Prostate cancer Brother    Cancer Brother        prostate    Hepatitis Brother        Hep C; alcoholism (terminal)   Drug abuse Brother        died 19   Other Brother        drug addiction   Gout Other        whole family     Social History:  reports that she has quit smoking. Her smoking use included cigarettes. She has a 10 pack-year smoking history. She has never used smokeless tobacco. She reports current alcohol use. She reports that she does not use drugs.  ROS:                                        Physical Exam: BP 130/79   Pulse 87   Ht 5\' 7"  (1.702 m)   Wt 108 kg   BMI 37.28 kg/m   Constitutional:  Alert and oriented, No acute distress. HEENT: [place] AT, moist mucus membranes.  Trachea midline, no masses.   Laboratory Data: Lab Results  Component Value Date   WBC 5.8 04/19/2022   HGB 12.8 04/19/2022   HCT 39.4 04/19/2022   MCV 87.7 04/19/2022   PLT 253.0 04/19/2022    Lab Results  Component Value Date   CREATININE 0.91  07/19/2022    No results found for: "PSA"  No results found for: "TESTOSTERONE"  Lab Results  Component Value Date   HGBA1C 6.3 07/19/2022    Urinalysis    Component Value Date/Time   COLORURINE YELLOW 08/07/2020 1639   APPEARANCEUR CLEAR 08/07/2020 1639   LABSPEC 1.024 08/07/2020 1639   PHURINE 5.5 08/07/2020 1639   GLUCOSEU NEGATIVE 08/07/2020 1639   GLUCOSEU Color Interference (A) 06/03/2020 0933   HGBUR NEGATIVE 08/07/2020 1639   BILIRUBINUR neg 03/07/2022 1636   KETONESUR NEGATIVE 08/07/2020 1639   PROTEINUR Negative 03/07/2022 1636   PROTEINUR TRACE (A) 08/07/2020 1639   UROBILINOGEN 0.2 03/07/2022 1636   UROBILINOGEN Color Interference (A) 06/03/2020 0933   NITRITE neg 03/07/2022 1636   NITRITE NEGATIVE 08/07/2020 1639   LEUKOCYTESUR Negative 03/07/2022 1636    LEUKOCYTESUR NEGATIVE 08/07/2020 1639    Pertinent Imaging:   Assessment & Plan: We did a cath culture was sent for culture.  Call if positive.  In the future I may or may not put her on prophylaxis.  I gave her 4 weeks Gemtesa samples and prescription and come back for pelvic examination and cystoscopy.  I will get more cultures.  She may need urodynamics in the future  1. Overactive bladder  - Urinalysis, Complete   No follow-ups on file.  Martina Sinner, MD  [facility] [address], [address] [place], [place] [address] [phone]

## 2022-11-04 LAB — CULTURE, URINE COMPREHENSIVE

## 2022-11-07 ENCOUNTER — Telehealth: Payer: Self-pay | Admitting: *Deleted

## 2022-11-07 NOTE — Telephone Encounter (Signed)
-----   Message from [place] A Macdiarmid sent at 11/07/2022  8:26 AM EDT ----- Macrodantin 100 mg twice a day for 7 days ----- Message ----- From: Interface, [facility] Lab Results In Sent: 10/31/2022   4:36 PM EDT To: Alfredo Martinez, MD

## 2022-11-07 NOTE — Telephone Encounter (Signed)
.  left message to have patient return my call.  

## 2022-11-08 MED ORDER — NITROFURANTOIN MONOHYD MACRO 100 MG PO CAPS: 100.0000 mg | ORAL_CAPSULE | Freq: Two times a day (BID) | ORAL | 0 refills | Status: AC

## 2022-11-08 NOTE — Telephone Encounter (Signed)
Spoke with patient and advised results rx sent to pharmacy by e-script

## 2022-11-15 ENCOUNTER — Telehealth: Payer: Self-pay | Admitting: Gastroenterology

## 2022-11-15 NOTE — Telephone Encounter (Signed)
Patient called in stating that she needs to cancel her procedure tomorrow because she is unable to do it that early in the morning.

## 2022-11-16 ENCOUNTER — Ambulatory Visit: Admission: RE | Admit: 2022-11-16 | Payer: Medicare Other | Source: Home / Self Care | Admitting: Gastroenterology

## 2022-11-16 ENCOUNTER — Encounter: Admission: RE | Payer: Self-pay | Source: Home / Self Care

## 2022-11-16 SURGERY — ESOPHAGOGASTRODUODENOSCOPY (EGD) WITH PROPOFOL
Anesthesia: General

## 2022-11-16 NOTE — Telephone Encounter (Signed)
Called patient and left her a voicemail to call me back to reschedule her mother's procedure.

## 2022-11-16 NOTE — Telephone Encounter (Signed)
Patient requested to have her procedure done in the afternoon. However, the endoscopy had mentioned to her that she would have to come in the morning. Patient did not agree and stated that she was not rescheduling if it's too early since she wants an afternoon time.

## 2022-11-16 NOTE — Telephone Encounter (Signed)
Dr. Tobi Bastos, I am not the one to provide the time. Patient called the endoscopy unit and requested a later time but not sure why she couldn't be given a later time. That's something you could ask at the endoscopy unit. However, the patient will not rescheduled unless she is scheduled after 11 AM.

## 2022-11-18 NOTE — Telephone Encounter (Signed)
Called patient's daughter: Shenita Trego at [phone] and left her a voicemail again to call us back so we could reschedule her mother's procedure. I provided her with my name and phone number.

## 2022-12-04 ENCOUNTER — Other Ambulatory Visit: Payer: Self-pay | Admitting: Internal Medicine

## 2022-12-07 NOTE — Telephone Encounter (Signed)
Tried calling patient and her daughter to  let her know that I need to reschedule her procedure with Dr. Tobi Bastos. I sent a letter for patient to call us back but has not heard back from them.

## 2022-12-24 ENCOUNTER — Emergency Department
Admission: EM | Admit: 2022-12-24 | Discharge: 2022-12-24 | Disposition: A | Discharge status: Home / Self Care | Payer: Medicare Other | Attending: Emergency Medicine | Admitting: Emergency Medicine

## 2022-12-24 ENCOUNTER — Emergency Department: Payer: Medicare Other

## 2022-12-24 ENCOUNTER — Other Ambulatory Visit: Payer: Self-pay

## 2022-12-24 DIAGNOSIS — M25511 Pain in right shoulder: Secondary | ICD-10-CM | POA: Diagnosis not present

## 2022-12-24 DIAGNOSIS — W19XXXA Unspecified fall, initial encounter: Secondary | ICD-10-CM

## 2022-12-24 DIAGNOSIS — R220 Localized swelling, mass and lump, head: Secondary | ICD-10-CM | POA: Diagnosis not present

## 2022-12-24 DIAGNOSIS — Z23 Encounter for immunization: Secondary | ICD-10-CM | POA: Insufficient documentation

## 2022-12-24 DIAGNOSIS — Z7901 Long term (current) use of anticoagulants: Secondary | ICD-10-CM | POA: Diagnosis not present

## 2022-12-24 DIAGNOSIS — S0990XA Unspecified injury of head, initial encounter: Secondary | ICD-10-CM

## 2022-12-24 DIAGNOSIS — G8929 Other chronic pain: Secondary | ICD-10-CM | POA: Insufficient documentation

## 2022-12-24 DIAGNOSIS — W06XXXA Fall from bed, initial encounter: Secondary | ICD-10-CM | POA: Diagnosis not present

## 2022-12-24 DIAGNOSIS — S0993XA Unspecified injury of face, initial encounter: Secondary | ICD-10-CM | POA: Diagnosis not present

## 2022-12-24 DIAGNOSIS — M25512 Pain in left shoulder: Secondary | ICD-10-CM | POA: Diagnosis not present

## 2022-12-24 DIAGNOSIS — R519 Headache, unspecified: Secondary | ICD-10-CM | POA: Diagnosis present

## 2022-12-24 DIAGNOSIS — S0003XA Contusion of scalp, initial encounter: Secondary | ICD-10-CM | POA: Insufficient documentation

## 2022-12-24 DIAGNOSIS — M19012 Primary osteoarthritis, left shoulder: Secondary | ICD-10-CM | POA: Diagnosis not present

## 2022-12-24 DIAGNOSIS — I700 Atherosclerosis of aorta: Secondary | ICD-10-CM | POA: Diagnosis not present

## 2022-12-24 DIAGNOSIS — S199XXA Unspecified injury of neck, initial encounter: Secondary | ICD-10-CM | POA: Diagnosis not present

## 2022-12-24 MED ORDER — TETANUS-DIPHTH-ACELL PERTUSSIS 5-2.5-18.5 LF-MCG/0.5 IM SUSY
0.5000 mL | PREFILLED_SYRINGE | Freq: Once | INTRAMUSCULAR | Status: AC
  Administered 2022-12-24: 0.5 mL via INTRAMUSCULAR
  Filled 2022-12-24: qty 0.5

## 2022-12-24 NOTE — Discharge Instructions (Signed)
Your CT scans did not show any acute injuries.  Your shoulder x-rays show arthritis bilaterally.  Please follow-up with your outpatient provider.  Please return for any new, worsening, or change in symptoms or other concerns.  It was a pleasure caring for you today.

## 2022-12-24 NOTE — ED Provider Notes (Signed)
[facility] Provider Note    Event Date/Time   First MD Initiated Contact with Patient 12/24/22 1235     (approximate)   History   Fall   HPI  Monica [name] is a 87 y.o. female with a past medical history of atrial flutter on Eliquis, morbid obesity, hyperlipidemia, arthritis who presents today for evaluation after a fall.  Patient reportedly rolled out of bed and hit her head.  She did not lose consciousness.  She was able to get to another room so that she could use her life alert.  She has not had any fevers or chills.  She reports that she has a mild headache in the back of her head where she hit her head.  No neck pain or visual changes.  No fevers or chills.  She has not had any difficulty walking.  Her daughter notes that she has had bilateral shoulder pain for several months and she is requesting x-rays of these as well.  Patient Active Problem List   Diagnosis Date Noted   Acquired thrombophilia (HCC) 10/06/2022   Aortic atherosclerosis (HCC) 07/20/2022   Hearing loss of right ear due to cerumen impaction 07/19/2022   Parotid tumor 07/19/2022   Candidiasis of skin 06/19/2022   Contact dermatitis and eczema 06/19/2022   Atrial flutter, unspecified type (HCC) 02/25/2022   Annual physical exam 02/25/2022   Need for pneumococcal vaccine 02/25/2022   Hypertensive heart disease with heart failure (HCC) 01/23/2022   Clavicular enlargement 12/29/2021   Prediabetes 09/22/2021   Overactive bladder 04/21/2021   Dysphagia 02/24/2021   Gastro-esophageal reflux disease without esophagitis 02/24/2021   Morbid (severe) obesity due to excess calories (HCC) 02/24/2021   Urinary frequency 02/17/2021   Abdominal aortic aneurysm (AAA) without rupture (HCC) 01/13/2020   Spinal stenosis of lumbar region    Vitamin D deficiency 12/26/2018   Lumbar radiculopathy 07/31/2017   Cervical disc disorder with radiculopathy of cervical region 12/01/2015   Right  anterior shoulder pain 06/11/2012   Knee osteoarthritis 02/09/2010   Gout 03/20/2008   Hyperlipidemia 12/21/2006   Obstructive sleep apnea 12/21/2006          Physical Exam   Triage Vital Signs: ED Triage Vitals  Encounter Vitals Group     BP 12/24/22 1214 (!) 140/56     Systolic BP Percentile --      Diastolic BP Percentile --      Pulse Rate 12/24/22 1214 71     Resp 12/24/22 1214 18     Temp 12/24/22 1214 97.7 F (36.5 C)     Temp Source 12/24/22 1214 Oral     SpO2 12/24/22 1214 96 %     Weight --      Height --      Head Circumference --      Peak Flow --      Pain Score 12/24/22 1215 3     Pain Loc --      Pain Education --      Exclude from Growth Chart --     Most recent vital signs: Vitals:   12/24/22 1214  BP: (!) 140/56  Pulse: 71  Resp: 18  Temp: 97.7 F (36.5 C)  SpO2: 96%    Physical Exam Vitals and nursing note reviewed.  Constitutional:      General: Awake and alert. No acute distress.    Appearance: Normal appearance. The patient is normal weight.  HENT:     Head: Normocephalic.  Hematoma to occiput, no active bleeding    Mouth: Mucous membranes are moist.  Eyes:     General: PERRL. Normal EOMs        Right eye: No discharge.        Left eye: No discharge.     Conjunctiva/sclera: Conjunctivae normal.  Cardiovascular:     Rate and Rhythm: Normal rate and regular rhythm.     Pulses: Normal pulses.  Pulmonary:     Effort: Pulmonary effort is normal. No respiratory distress.     Breath sounds: Normal breath sounds.  Abdominal:     Abdomen is soft. There is no abdominal tenderness. No rebound or guarding. No distention. Musculoskeletal:        General: No swelling. Normal range of motion.     Cervical back: Normal range of motion and neck supple. No midline cervical spine tenderness.  Full range of motion of neck.  Negative Spurling test.  Negative Lhermitte sign.  Normal strength and sensation in bilateral upper extremities. Normal grip  strength bilaterally.  Normal intrinsic muscle function of the hand bilaterally.  Normal radial pulses bilaterally. No tenderness bilateral shoulders, normal range of motion of bilateral shoulders, though pain past 90 degrees. Skin:    General: Skin is warm and dry.     Capillary Refill: Capillary refill takes less than 2 seconds.     Findings: No rash.  Neurological:     Mental Status: The patient is awake and alert.   Neurological: GCS 15 alert and oriented x3 Normal speech, no expressive or receptive aphasia or dysarthria Cranial nerves II through XII intact Normal visual fields 5 out of 5 strength in all 4 extremities with intact sensation throughout No extremity drift Normal finger-to-nose testing, no limb or truncal ataxia    ED Results / Procedures / Treatments   Labs (all labs ordered are listed, but only abnormal results are displayed) Labs Reviewed - No data to display   EKG     RADIOLOGY I independently reviewed and interpreted imaging and agree with radiologists findings.     PROCEDURES:  Critical Care performed:   Procedures   MEDICATIONS ORDERED IN ED: Medications  Tdap (BOOSTRIX) injection 0.5 mL (0.5 mLs Intramuscular Given 12/24/22 1349)     IMPRESSION / MDM / ASSESSMENT AND PLAN / ED COURSE  I reviewed the triage vital signs and the nursing notes.   Differential diagnosis includes, but is not limited to, contusion, concussion, cervical spine injury, intracranial hemorrhage.  Patient is awake and alert, hemodynamically stable and afebrile.  She is neurologically intact.  CT head and neck obtained per [place] criteria.  Patient has normal strength and sensation of bilateral upper extremities, sensation is intact light touch throughout.  Normal grip strength muscle function of the hands bilaterally, I do not suspect central cord syndrome.  She has had bilateral shoulder pain for many months which the daughter reports is due to her arthritis,  though she would like an x-ray to check on these.  They are not worse after the fall according to the patient.  CT head, neck, face imaging is normal.  X-rays of her shoulders reveal bilateral glenohumeral degenerative changes without fracture or dislocation.  Patient and daughter reassured by these findings.  We discussed return precautions and importance of close outpatient follow-up.  Patient understands and agrees with plan.  She was discharged in stable condition.   Patient's presentation is most consistent with acute complicated illness / injury requiring diagnostic workup.  FINAL CLINICAL IMPRESSION(S) / ED DIAGNOSES   Final diagnoses:  Fall, initial encounter  Injury of head, initial encounter  Chronic pain of both shoulders     Rx / DC Orders   ED Discharge Orders     None        Note:  This document was prepared using Dragon voice recognition software and may include unintentional dictation errors.   Keturah Shavers 12/24/22 1423    Jene Every, MD 12/24/22 [phone]

## 2022-12-24 NOTE — ED Triage Notes (Addendum)
Pt here for a mechanical fall at home this morning. Was sleeping in bed and rolled onto floor. No LOC, pt is on blood thinners (Eliquis). Pt head was hit. Pt with daughter.

## 2023-01-02 ENCOUNTER — Encounter: Payer: Self-pay | Admitting: Urology

## 2023-01-02 ENCOUNTER — Ambulatory Visit (INDEPENDENT_AMBULATORY_CARE_PROVIDER_SITE_OTHER): Payer: Medicare Other | Admitting: Urology

## 2023-01-02 VITALS — BP 150/87 | HR 75 | Wt 236.6 lb

## 2023-01-02 DIAGNOSIS — N3281 Overactive bladder: Secondary | ICD-10-CM | POA: Diagnosis not present

## 2023-01-02 DIAGNOSIS — N3941 Urge incontinence: Secondary | ICD-10-CM

## 2023-01-02 LAB — URINALYSIS, COMPLETE
Bilirubin, UA: NEGATIVE
Glucose, UA: NEGATIVE
Ketones, UA: NEGATIVE
Leukocytes,UA: NEGATIVE
Nitrite, UA: NEGATIVE
Protein,UA: NEGATIVE
RBC, UA: NEGATIVE
Specific Gravity, UA: 1.015 (ref 1.005–1.030)
Urobilinogen, Ur: 0.2 mg/dL (ref 0.2–1.0)
pH, UA: 7 (ref 5.0–7.5)

## 2023-01-02 LAB — MICROSCOPIC EXAMINATION
Bacteria, UA: NONE SEEN
RBC, Urine: NONE SEEN /[HPF] (ref 0–2)

## 2023-01-02 MED ORDER — TRIMETHOPRIM 100 MG PO TABS: 100.0000 mg | ORAL_TABLET | Freq: Every day | ORAL | 11 refills | Status: DC

## 2023-01-02 NOTE — Progress Notes (Signed)
01/02/2023 10:35 AM   Charlett Nose 09-Jul-1935 [id number]  Referring provider: Sherlene Shams, MD [address] [place],  [place] [address]  Chief Complaint  Patient presents with   Cysto    HPI: I was consulted to assess the patient is urinary incontinence.  She is not a strong historian but I think she has urge incontinence but also leakage without awareness into her 6 pads or more per day that are moderately wet.  She has moderately severe bedwetting.  She voids every 1-2 hours and gets up least twice a night   She was nonspecific in terms of symptoms but she thinks she gets 5 or 6 infections a year.  In 2022 she had a negative culture, Streptococcus in the urine, and 1 positive culture.   She was given trospium but it was not filled due to the potential of a drug drug interaction.  She had a previous stroke.  She uses a cane.     Patient will be treated as an overactive bladder with bedwetting and urge incontinence and leakage with awareness.  She has a functional component.  She is likely getting recurrent urinary tract infections.  I did not order an ultrasound but may in the future.  Come back on Myrbetriq samples and prescriptions for pelvic exam and cystoscopy and stress test in 6 weeks.  Check residual then.  Likely will not be ordering urodynamics    Today Frequency stable.  No recent urine culture.  Conversation was not crystal-clear but it appears that she took Myrbetriq 25 mg samples and they really did not help much.  When she tried to fill it there may have been a potential for drug-drug interaction.  She never took [place] since pharmacist it causes hypertension.   I am not convinced that she had any UTI symptoms today but she had vague feelings in the pelvic area    We did a cath culture was sent for culture. Call if positive. In the future I may or may not put her on prophylaxis. I gave her 4 weeks Gemtesa samples and prescription and come back for  pelvic examination and cystoscopy. I will get more cultures. She may need urodynamics in the future   Today Frequency stable.  Last urine culture positive Clinically not infected.  Denies stress incontinence.  Failed Gemtesa and still has mixed incontinence noted high-volume  On pelvic examination she had grade 2 hypermobility the bladder neck and negative cough test after cystoscopy.  No significant prolapse.  Based upon redundant labia it would be difficult for her to perform clean intermittent catheterization  Cystoscopy: Patient underwent flexible cystoscopy.  Bladder mucosa and trigone were normal.  She had white flecks in the urine.  Urine was aspirated and sent for culture    PMH: Past Medical History:  Diagnosis Date   Abnormal gait 09/22/2021   Abnormal weight gain 02/24/2021   Allergic rhinitis    Anxiety    Benign neoplasm of breast 2013   Bilateral shoulder pain 01/13/2020   Cardiac arrhythmia    Cardiomegaly    Cataract    Cervicalgia 01/13/2020   CHF (congestive heart failure) (HCC)    Chronic back pain 12/26/2018   Severe stenosis L spine       Chronic cough 04/15/2016   Chronic knee pain 07/22/2020   Chronic left shoulder pain 06/07/2019   Colon cancer screening 02/24/2021   Compulsive eating patterns    Compulsive overeating    Compulsive  overeating 01/01/2015   Degenerative lumbar spinal stenosis 09/20/2017   Depression    Depression with anxiety 12/21/2006   Diastolic heart failure (HCC)    LVH with free wall thickness 1.4 cm EF 60% Dr. Katrinka Blazing    Diffuse cystic mastopathy 2013   Diverticulosis of large intestine without perforation or abscess without bleeding    Dysuria 06/03/2020   Edema    Enlarged clavicle 12/29/2021   Epistaxis    noted 12/20/18    Epistaxis 01/07/2019   Essential hypertension 12/21/2006   Generalized abdominal pain 01/03/2019   GI bleeding    in ~2018    Gout    Grief 01/13/2020   Hiatal hernia    HTN (hypertension)     Hyperlipidemia    Intertrigo 10/01/2020   Iron deficiency anemia 01/03/2019   Kidney cysts 01/13/2020   Leg DVT (deep venous thromboembolism), acute, bilateral (HCC) 12/26/2018   Noted [place] legs 09/02/18 b/l       Lower GI bleed 10/03/2016   Lump or mass in breast 2012   Memory loss 03/06/2019   Middle cerebral artery embolism, 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   OA (osteoarthritis) of knee    with injections   Obesity    OSA (obstructive sleep apnea)    not on cpap   Other abnormal glucose    Partial nontraumatic rupture of right rotator cuff 09/22/2015   Pelvic floor dysfunction 06/06/2019   Peptic stricture of esophagus    Personal history of tobacco use, presenting hazards to health    Rectal bleeding 02/24/2021   Recurrent falls 01/13/2020   Sciatica    Seborrheic keratoses 04/15/2016   Sinus infection 2010   Spinal stenosis    lumbar    Status post bilateral knee replacements 05/04/2016   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   09/01/2018   Established GNA with thrombectomy intracranial in hospital    Stroke [facility])    Unspecified sleep apnea    Urge incontinence of urine 09/09/2011   Urine frequency 02/17/2021   UTI (urinary tract infection)    e coli 06/2020    Surgical History: Past Surgical History:  Procedure Laterality Date   adenosine cardiolite  1/08   low risk    APPENDECTOMY     BREAST BIOPSY     02/28/11 negative   BREAST LUMPECTOMY     right x1 ('89) left x2 ('70s, '90)   CARDIAC CATHETERIZATION  2001   normal per pt   COLONOSCOPY  11/02   diverticulosis   COLONOSCOPY  9/06   diverticulosis, hemorhoids   COLONOSCOPY N/A 10/01/2016   Procedure: COLONOSCOPY;  Surgeon: Iva Boop, MD;  Location: [facility];  Service: Endoscopy;  Laterality: N/A;   dexa  3/02   normal   EYE SURGERY  2012   cataract   IR CT HEAD LTD  09/01/2018   IR PERCUTANEOUS ART THROMBECTOMY/INFUSION INTRACRANIAL INC DIAG ANGIO  09/01/2018   JOINT REPLACEMENT     knee b/l 2011   knee replacement  Right 9/11   R. Dr. Priscille Kluver   RADIOLOGY WITH ANESTHESIA N/A 09/01/2018   Procedure: IR WITH ANESTHESIA;  Surgeon: Julieanne Cotton, MD;  Location: [facility] OR;  Service: Radiology;  Laterality: N/A;   sleep study  5/05   TONSILLECTOMY     TOTAL ABDOMINAL HYSTERECTOMY     fibroids, age of 16   TOTAL KNEE ARTHROPLASTY Left 2012   US TRANSVAGINAL PELVIC MODIFIED  2000, 2001    Home Medications:  Allergies as of 01/02/2023  Reactions   Lisinopril Other (See Comments)   REACTION: dizziness        Medication List        Accurate as of January 02, 2023 10:35 AM. If you have any questions, ask your nurse or doctor.          allopurinol 100 MG tablet Commonly known as: ZYLOPRIM TAKE 2 TABLETS BY MOUTH EVERY DAY   ALPRAZolam 0.25 MG tablet Commonly known as: XANAX Take 1 tablet (0.25 mg total) by mouth 2 (two) times daily as needed for anxiety.   amLODipine 5 MG tablet Commonly known as: NORVASC Take 1 tablet (5 mg total) by mouth daily. Take additional 5 mg in pm if BP>130/>80   apixaban 5 MG Tabs tablet Commonly known as: Eliquis Take 1 tablet (5 mg total) by mouth 2 (two) times daily.   atorvastatin 20 MG tablet Commonly known as: LIPITOR Take 1 tablet (20 mg total) by mouth at bedtime.   DULoxetine 30 MG capsule Commonly known as: CYMBALTA TAKE 1 CAPSULE BY MOUTH EVERY DAY   furosemide 40 MG tablet Commonly known as: LASIX Take 1 tablet (40 mg total) by mouth daily.   gabapentin 300 MG capsule Commonly known as: NEURONTIN Take 1 tablet in the morning and 2 tablets at nightTake 1 tablet in the morning and 2 tablets at night   Gemtesa 75 MG Tabs Generic drug: Vibegron Take 1 tablet (75 mg total) by mouth daily.   Gemtesa 75 MG Tabs Generic drug: Vibegron Take 1 tablet (75 mg total) by mouth daily.   hydrocortisone 2.5 % cream Apply topically 2 (two) times daily. What changed: additional instructions   Magnesium 250 MG Tabs Take 250 mg by mouth  daily.   methylPREDNISolone 4 MG tablet Commonly known as: MEDROL Take 4 mg by mouth daily.   metoprolol succinate 100 MG 24 hr tablet Commonly known as: TOPROL-XL Take with or immediately following a meal.   nystatin cream Commonly known as: MYCOSTATIN Apply 1 Application topically 3 (three) times daily. Prn right back   potassium chloride SA 20 MEQ tablet Commonly known as: Klor-Con M20 Take 1 tablet (20 mEq total) by mouth daily. TAKE TABLET BY MOUTH AS DIRECTED   triamcinolone cream 0.1 % Commonly known as: KENALOG APPLY 1 APPLICATION TOPICALLY 2 (TWO) TIMES DAILY. TO PATCHY RASH        Allergies:  Allergies  Allergen Reactions   Lisinopril Other (See Comments)    REACTION: dizziness    Family History: Family History  Problem Relation Age of Onset   Hypertension Mother        (a lot of animosity in their relationship)   Heart failure Mother    Stroke Father    Breast cancer Sister    Cancer Sister        breast cancer   Prostate cancer Brother    Cancer Brother        prostate    Hepatitis Brother        Hep C; alcoholism (terminal)   Drug abuse Brother        died 64   Other Brother        drug addiction   Gout Other        whole family     Social History:  reports that she has quit smoking. Her smoking use included cigarettes. She has a 10 pack-year smoking history. She has never used smokeless tobacco. She reports current alcohol use. She reports that she  does not use drugs.  ROS:                                        Physical Exam: There were no vitals taken for this visit.  Constitutional:  Alert and oriented, No acute distress.  Laboratory Data: Lab Results  Component Value Date   WBC 5.8 04/19/2022   HGB 12.8 04/19/2022   HCT 39.4 04/19/2022   MCV 87.7 04/19/2022   PLT 253.0 04/19/2022    Lab Results  Component Value Date   CREATININE 0.91 07/19/2022    No results found for: "PSA"  No results found  for: "TESTOSTERONE"  Lab Results  Component Value Date   HGBA1C 6.3 07/19/2022    Urinalysis    Component Value Date/Time   COLORURINE YELLOW 08/07/2020 1639   APPEARANCEUR Clear 10/31/2022 1408   LABSPEC 1.024 08/07/2020 1639   PHURINE 5.5 08/07/2020 1639   GLUCOSEU Negative 10/31/2022 1408   GLUCOSEU Color Interference (A) 06/03/2020 0933   HGBUR NEGATIVE 08/07/2020 1639   BILIRUBINUR Negative 10/31/2022 1408   KETONESUR NEGATIVE 08/07/2020 1639   PROTEINUR Negative 10/31/2022 1408   PROTEINUR TRACE (A) 08/07/2020 1639   UROBILINOGEN 0.2 03/07/2022 1636   UROBILINOGEN Color Interference (A) 06/03/2020 0933   NITRITE Negative 10/31/2022 1408   NITRITE NEGATIVE 08/07/2020 1639   LEUKOCYTESUR Negative 10/31/2022 1408   LEUKOCYTESUR NEGATIVE 08/07/2020 1639    Pertinent Imaging:   Assessment & Plan: Patient has high-volume urge incontinence and bedwetting.  Based upon age and comorbidities some of the refractory treatments such as Botox and/or InterStim may not be appropriate.  Role of oxybutynin discussed.  Call if culture positive.    The patient's daughter was very helpful and really think she is getting a lot of UTIs.  Pathophysiology discussed.  Probiotics discussed.  Placed patient on trimethoprim 100 mg 30 x 11 and the daughter felt this was a good idea.  Hopefully this will help down regulate some of her incontinence and this was discussed.  For the time being she will stay on Gemtesa hoping that it may work better with the trimethoprim.  They do not want to start oxybutynin at this stage.  They want to get urodynamics and proceed accordingly.  They may or may not start oxybutynin in the future or trospium   1. Overactive bladder  - Urinalysis, Complete   No follow-ups on file.  Martina Sinner, MD  [facility] [address], [address] [place], [place] [address] [phone]

## 2023-01-05 LAB — CULTURE, URINE COMPREHENSIVE

## 2023-01-06 ENCOUNTER — Telehealth: Payer: Self-pay | Admitting: *Deleted

## 2023-01-06 DIAGNOSIS — N3281 Overactive bladder: Secondary | ICD-10-CM

## 2023-01-06 MED ORDER — NITROFURANTOIN MONOHYD MACRO 100 MG PO CAPS: 100.0000 mg | ORAL_CAPSULE | Freq: Two times a day (BID) | ORAL | 0 refills | Status: AC

## 2023-01-06 NOTE — Telephone Encounter (Signed)
-----   Message from [place] A Macdiarmid sent at 01/06/2023  2:12 PM EST ----- Macrodantin 100 mg po bid for 7 days Then go back on daily triprim ----- Message ----- From: Interface, [facility] Lab Results In Sent: 01/02/2023   4:36 PM EST To: Alfredo Martinez, MD

## 2023-01-06 NOTE — Telephone Encounter (Signed)
Spoke with daughter and advised results, she was told by the pharmacist at [facility] that her mother should not take the trimpex due to it will raise her potassium. Daughter asking if there is something else to take prophylaxis?  rx sent to pharmacy by e-script

## 2023-01-09 ENCOUNTER — Telehealth: Payer: Self-pay | Admitting: *Deleted

## 2023-01-09 MED ORDER — NITROFURANTOIN MONOHYD MACRO 100 MG PO CAPS: 100.0000 mg | ORAL_CAPSULE | Freq: Every day | ORAL | 3 refills | Status: DC

## 2023-01-09 NOTE — Telephone Encounter (Signed)
Spoke with daughter and advised results. rx sent to pharmacy by e-script  

## 2023-01-09 NOTE — Telephone Encounter (Signed)
Transition Care Management Unsuccessful Follow-up Telephone Call  Date of discharge and from where:  [facility]  12/24/2022  Attempts:  1st Attempt  Reason for unsuccessful TCM follow-up call:  No answer/busy

## 2023-01-11 ENCOUNTER — Telehealth: Payer: Self-pay | Admitting: *Deleted

## 2023-01-11 NOTE — Telephone Encounter (Signed)
Transition Care Management Unsuccessful Follow-up Telephone Call  Date of discharge and from where:    [facility]  12/24/2022   Attempts:  2nd Attempt  Reason for unsuccessful TCM follow-up call:  No answer/busy

## 2023-01-30 DIAGNOSIS — R350 Frequency of micturition: Secondary | ICD-10-CM | POA: Diagnosis not present

## 2023-01-30 DIAGNOSIS — N3941 Urge incontinence: Secondary | ICD-10-CM | POA: Diagnosis not present

## 2023-01-31 ENCOUNTER — Telehealth: Payer: Self-pay | Admitting: Internal Medicine

## 2023-01-31 NOTE — Telephone Encounter (Signed)
Copied from CRM [id number]. Topic: Medicare AWV >> Jan 31, 2023  9:56 AM Payton Doughty wrote: Reason for CRM: Called LVM 01/31/2023 to schedule Annual Wellness Visit  Verlee Rossetti; Care Guide Ambulatory Clinical Support [place] [facility] Direct Dial: [phone]

## 2023-02-20 ENCOUNTER — Other Ambulatory Visit: Payer: Self-pay | Admitting: Cardiovascular Disease

## 2023-03-03 ENCOUNTER — Other Ambulatory Visit: Payer: Self-pay | Admitting: Internal Medicine

## 2023-03-03 DIAGNOSIS — M109 Gout, unspecified: Secondary | ICD-10-CM

## 2023-03-08 ENCOUNTER — Telehealth: Payer: Self-pay | Admitting: Internal Medicine

## 2023-03-08 NOTE — Telephone Encounter (Signed)
 Copied from CRM [id number]. Topic: Medicare AWV >> Mar 08, 2023 11:17 AM Juliana Ocean wrote: Reason for CRM: Called LVM 03/08/2023 to schedule AWV. Please schedule office or virtual visits.  Rosalee Collins; Care Guide Ambulatory Clinical Support [place] [facility] Direct Dial: [phone]

## 2023-03-18 ENCOUNTER — Other Ambulatory Visit: Payer: Self-pay | Admitting: Cardiovascular Disease

## 2023-03-18 DIAGNOSIS — E876 Hypokalemia: Secondary | ICD-10-CM

## 2023-03-20 ENCOUNTER — Telehealth: Payer: Self-pay

## 2023-03-20 ENCOUNTER — Ambulatory Visit: Payer: BC Managed Care – PPO | Admitting: Urology

## 2023-03-20 NOTE — Telephone Encounter (Signed)
Pt returning call, requesting cb

## 2023-03-20 NOTE — Telephone Encounter (Signed)
Med  has been dc from list.  Message left asking for return call to confirm if taking (will create a phone note).

## 2023-03-20 NOTE — Telephone Encounter (Signed)
Last office visit: 03/22/22 with plan to f/u 6 months. next office visit: none/active recall  Please schedule f/u appt.  Thanks!

## 2023-03-20 NOTE — Telephone Encounter (Signed)
Refill request received for Digoxin 125 mcg 0.5 table every day.  This medication has been dc from current med list.  No change at last visit with Dr. Kirke Corin on 03/22/22 and has not 6 month f/u as advised.  Left message for patient to call to confirm if taking Digoxin.  Also message to scheduling for appt.

## 2023-03-20 NOTE — Telephone Encounter (Signed)
Lvm to schedule f/u appt.

## 2023-03-21 NOTE — Telephone Encounter (Signed)
Returned call but no answer.  Message left asking to call for med confirmation.

## 2023-03-24 ENCOUNTER — Encounter: Payer: Self-pay | Admitting: Physician Assistant

## 2023-03-24 ENCOUNTER — Ambulatory Visit: Payer: Medicare PPO | Attending: Physician Assistant | Admitting: Physician Assistant

## 2023-03-24 VITALS — BP 130/77 | HR 73 | Ht 67.0 in | Wt 229.0 lb

## 2023-03-24 DIAGNOSIS — I5032 Chronic diastolic (congestive) heart failure: Secondary | ICD-10-CM

## 2023-03-24 DIAGNOSIS — R5383 Other fatigue: Secondary | ICD-10-CM

## 2023-03-24 DIAGNOSIS — I4892 Unspecified atrial flutter: Secondary | ICD-10-CM

## 2023-03-24 DIAGNOSIS — E785 Hyperlipidemia, unspecified: Secondary | ICD-10-CM

## 2023-03-24 DIAGNOSIS — Z86718 Personal history of other venous thrombosis and embolism: Secondary | ICD-10-CM

## 2023-03-24 DIAGNOSIS — Z79899 Other long term (current) drug therapy: Secondary | ICD-10-CM

## 2023-03-24 DIAGNOSIS — R0602 Shortness of breath: Secondary | ICD-10-CM | POA: Diagnosis not present

## 2023-03-24 DIAGNOSIS — I10 Essential (primary) hypertension: Secondary | ICD-10-CM

## 2023-03-24 DIAGNOSIS — I2510 Atherosclerotic heart disease of native coronary artery without angina pectoris: Secondary | ICD-10-CM | POA: Diagnosis not present

## 2023-03-24 NOTE — Progress Notes (Signed)
Cardiology Office Note    Date:  03/24/2023   ID:  Monica [name], DOB 1935/03/26, MRN [id number]  PCP:  Sherlene Shams, MD  Cardiologist:  Lorine Bears, MD  Electrophysiologist:  None   Chief Complaint: Follow-up  History of Present Illness:   Monica [name] is a 88 y.o. female with history of coronary artery calcification, atrial flutter, HFpEF, previous embolic CVA, bilateral DVT, HTN, and OSA who presents for follow-up of coronary artery calcification, atrial flutter, and HFpEF.  She was previously followed by Dr. Katrinka Blazing with transition of care to Dr. Kirke Corin in 02/2022.  She was admitted in 2020 with a left MCA stroke and treated with catheter-based intervention.  She was found to have bilateral DVT and was anticoagulated with apixaban.  Echo at that time showed an EF greater than 65%, diastolic dysfunction, normal RV systolic function with normal ventricular cavity size, mildly to moderately dilated left atrium, moderate mitral annular calcification, and no intracardiac thrombi noted.  She developed atrial flutter that converted to sinus rhythm with amiodarone with subsequent discontinuation of antiarrhythmic therapy.  She was noted to be in atrial flutter in 2022 with intermittent tachycardia.  Low-dose digoxin was added at that time.  She was most recently seen in the office by Dr. Kirke Corin in 02/2022 and reported chronic exertional dyspnea.  She was in sinus rhythm at that time.  She comes in accompanied by her daughter today and is doing well from a cardiac perspective.  She is without symptoms of angina or cardiac decompensation.  She does note some exertional shortness of breath and fatigue.  Her daughter feels like the exertional fatigue became more pronounced following the initiation of digoxin.  Patient reports that she typically walks fast at baseline, though becomes short of breath and fatigued with this.  She also reports getting short of breath and fatigued when getting onto  the exam table in the office today.  No significant lower extremity swelling or progressive orthopnea.  She has fallen approximately 4 times over the past year without LOC.  No hematochezia or melena.  Remains adherent to apixaban.   Labs independently reviewed: 06/2022 - A1c 6.3, potassium 3.8, BUN 17, serum creatinine 0.91, albumin 3.8, AST/ALT normal, direct LDL 66, TC 140, TG 163, HDL 34 03/2022 - TSH normal, Hgb 12.8, PLT 253  Past Medical History:  Diagnosis Date   Abnormal gait 09/22/2021   Abnormal weight gain 02/24/2021   Allergic rhinitis    Anxiety    Benign neoplasm of breast 2013   Bilateral shoulder pain 01/13/2020   Cardiac arrhythmia    Cardiomegaly    Cataract    Cervicalgia 01/13/2020   CHF (congestive heart failure) (HCC)    Chronic back pain 12/26/2018   Severe stenosis L spine       Chronic cough 04/15/2016   Chronic knee pain 07/22/2020   Chronic left shoulder pain 06/07/2019   Colon cancer screening 02/24/2021   Compulsive eating patterns    Compulsive overeating    Compulsive overeating 01/01/2015   Degenerative lumbar spinal stenosis 09/20/2017   Depression    Depression with anxiety 12/21/2006   Diastolic heart failure (HCC)    LVH with free wall thickness 1.4 cm EF 60% Dr. Katrinka Blazing    Diffuse cystic mastopathy 2013   Diverticulosis of large intestine without perforation or abscess without bleeding    Dysuria 06/03/2020   Edema    Enlarged clavicle 12/29/2021   Epistaxis    noted 12/20/18  Epistaxis 01/07/2019   Essential hypertension 12/21/2006   Generalized abdominal pain 01/03/2019   GI bleeding    in ~2018    Gout    Grief 01/13/2020   Hiatal hernia    HTN (hypertension)    Hyperlipidemia    Intertrigo 10/01/2020   Iron deficiency anemia 01/03/2019   Kidney cysts 01/13/2020   Leg DVT (deep venous thromboembolism), acute, bilateral (HCC) 12/26/2018   Noted [place] legs 09/02/18 b/l       Lower GI bleed 10/03/2016   Lump or mass in breast  2012   Memory loss 03/06/2019   Middle cerebral artery embolism, 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   OA (osteoarthritis) of knee    with injections   Obesity    OSA (obstructive sleep apnea)    not on cpap   Other abnormal glucose    Partial nontraumatic rupture of right rotator cuff 09/22/2015   Pelvic floor dysfunction 06/06/2019   Peptic stricture of esophagus    Personal history of tobacco use, presenting hazards to health    Rectal bleeding 02/24/2021   Recurrent falls 01/13/2020   Sciatica    Seborrheic keratoses 04/15/2016   Sinus infection 2010   Spinal stenosis    lumbar    Status post bilateral knee replacements 05/04/2016   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   09/01/2018   Established GNA with thrombectomy intracranial in hospital    Stroke [facility])    Unspecified sleep apnea    Urge incontinence of urine 09/09/2011   Urine frequency 02/17/2021   UTI (urinary tract infection)    e coli 06/2020    Past Surgical History:  Procedure Laterality Date   adenosine cardiolite  1/08   low risk    APPENDECTOMY     BREAST BIOPSY     02/28/11 negative   BREAST LUMPECTOMY     right x1 ('89) left x2 ('70s, '90)   CARDIAC CATHETERIZATION  2001   normal per pt   COLONOSCOPY  11/02   diverticulosis   COLONOSCOPY  9/06   diverticulosis, hemorhoids   COLONOSCOPY N/A 10/01/2016   Procedure: COLONOSCOPY;  Surgeon: Iva Boop, MD;  Location: [facility];  Service: Endoscopy;  Laterality: N/A;   dexa  3/02   normal   EYE SURGERY  2012   cataract   IR CT HEAD LTD  09/01/2018   IR PERCUTANEOUS ART THROMBECTOMY/INFUSION INTRACRANIAL INC DIAG ANGIO  09/01/2018   JOINT REPLACEMENT     knee b/l 2011   knee replacement Right 9/11   R. Dr. Priscille Kluver   RADIOLOGY WITH ANESTHESIA N/A 09/01/2018   Procedure: IR WITH ANESTHESIA;  Surgeon: Julieanne Cotton, MD;  Location: [facility] OR;  Service: Radiology;  Laterality: N/A;   sleep study  5/05   TONSILLECTOMY     TOTAL ABDOMINAL HYSTERECTOMY     fibroids, age  of 51   TOTAL KNEE ARTHROPLASTY Left 2012   US TRANSVAGINAL PELVIC MODIFIED  2000, 2001    Current Medications: Current Meds  Medication Sig   allopurinol (ZYLOPRIM) 100 MG tablet TAKE 2 TABLETS BY MOUTH EVERY DAY   ALPRAZolam (XANAX) 0.25 MG tablet Take 1 tablet (0.25 mg total) by mouth 2 (two) times daily as needed for anxiety.   amLODipine (NORVASC) 5 MG tablet Take 1 tablet (5 mg total) by mouth daily. Take additional 5 mg in pm if BP>130/>80   apixaban (ELIQUIS) 5 MG TABS tablet Take 1 tablet (5 mg total) by mouth 2 (two) times daily.   atorvastatin (LIPITOR)  20 MG tablet Take 1 tablet (20 mg total) by mouth at bedtime.   DULoxetine (CYMBALTA) 30 MG capsule TAKE 1 CAPSULE BY MOUTH EVERY DAY   furosemide (LASIX) 40 MG tablet Take 1 tablet (40 mg total) by mouth daily.   metoprolol succinate (TOPROL-XL) 100 MG 24 hr tablet Take with or immediately following a meal.   nystatin cream (MYCOSTATIN) Apply 1 Application topically 3 (three) times daily. Prn right back   potassium chloride SA (KLOR-CON M20) 20 MEQ tablet Take 1 tablet (20 mEq total) by mouth daily. Overdue follow up visit.  PLEASE CALL OFFICE TO SCHEDULE APPOINTMENT PRIOR TO NEXT REFILL (first attempt)   Vibegron (GEMTESA) 75 MG TABS Take 1 tablet (75 mg total) by mouth daily.   [DISCONTINUED] digoxin (LANOXIN) 0.125 MG tablet Take 62.5 mcg by mouth daily.    Allergies:   Lisinopril   Social History   Socioeconomic History   Marital status: Widowed    Spouse name: Not on file   Number of children: 1   Years of education: Not on file   Highest education level: Not on file  Occupational History   Occupation: Retired school principal    Employer: RETIRED  Tobacco Use   Smoking status: Former    Current packs/day: 0.50    Average packs/day: 0.5 packs/day for 20.0 years (10.0 ttl pk-yrs)    Types: Cigarettes   Smokeless tobacco: Never   Tobacco comments:    quit approx. 20 years ago  Vaping Use   Vaping status:  Never Used  Substance and Sexual Activity   Alcohol use: Yes    Alcohol/week: 0.0 standard drinks of alcohol    Comment: wine occasional   Drug use: No   Sexual activity: Never  Other Topics Concern   Not on file  Social History Narrative   Widowed x 30+ years as of 12/2018    1 daughter. Retired principal. Has had to take care of sick family members    Masters degree retired Lawyer    Social Drivers of Corporate investment banker Strain: Low Risk  (02/08/2022)   Overall Financial Resource Strain (CARDIA)    Difficulty of Paying Living Expenses: Not hard at all  Food Insecurity: No Food Insecurity (02/08/2022)   Hunger Vital Sign    Worried About Running Out of Food in the Last Year: Never true    Ran Out of Food in the Last Year: Never true  Transportation Needs: No Transportation Needs (02/08/2022)   PRAPARE - Administrator, Civil Service (Medical): No    Lack of Transportation (Non-Medical): No  Physical Activity: Not on file  Stress: No Stress Concern Present (04/01/2020)   [employer] of Occupational Health - Occupational Stress Questionnaire    Feeling of Stress : Not at all  Social Connections: Unknown (02/08/2022)   Social Connection and Isolation Panel [NHANES]    Frequency of Communication with Friends and Family: Three times a week    Frequency of Social Gatherings with Friends and Family: Three times a week    Attends Religious Services: More than 4 times per year    Active Member of Clubs or Organizations: Not on file    Attends Banker Meetings: Not on file    Marital Status: Not on file     Family History:  The patient's family history includes Breast cancer in her sister; Cancer in her brother and sister; Drug abuse in her brother; Gout in an other family  member; Heart failure in her mother; Hepatitis in her brother; Hypertension in her mother; Other in her brother; Prostate cancer in her brother; Stroke in her father.  ROS:    12-point review of systems is negative unless otherwise noted in the HPI.   EKGs/Labs/Other Studies Reviewed:    Studies reviewed were summarized above. The additional studies were reviewed today:  Zio patch 05/2020: Atrial Fibrillation occurred continuously (100% burden), ranging from 59-135 bpm (avg of 78 bpm). Isolated VEs were rare (<1.0%), VE Couplets were rare (<1.0%), and no VE Triplets were present.  __________  Outpatient cardiac monitor 12/2018: Normal sinus rhythm and sinus bradycardia. No atrial fibrillation or flutter. __________  2D echo 09/02/2018: 1. The left ventricle has hyperdynamic systolic function, with an  ejection fraction of >65%. The cavity size was normal. There is mildly  increased left ventricular wall thickness. Left ventricular diastolic  Doppler parameters are consistent with  pseudonormalization.   2. The right ventricle has normal systolic function. The cavity was  normal. There is no increase in right ventricular wall thickness. Right  ventricular systolic pressure could not be assessed.   3. Left atrial size was mild-moderately dilated.   4. The mitral valve is abnormal. There is moderate mitral annular  calcification present.   5. No stenosis of the aortic valve.   6. Likely calcified, prominent Coumadin ridge (normal variant landmark  between LA appendage and left upper pulmonary vein). There is a 0.8 x 1.4  cm calcified mass like lesion adherent to the lateral LA wall, in the  location of the Coumadin ridge (left  lateral ridge). It is non mobile, and likely represents calcified normal  variant structure.   7. No intracardiac thrombi or masses were visualized.     EKG:  EKG is ordered today.  The EKG ordered today demonstrates NSR, 73 bpm, LVH, nonspecific ST-T changes, consistent with prior tracing  Recent Labs: 04/19/2022: Hemoglobin 12.8; Platelets 253.0; TSH 2.19 07/19/2022: ALT 16; BUN 17; Creatinine, Ser 0.91; Potassium 3.8;  Sodium 140  Recent Lipid Panel    Component Value Date/Time   CHOL 114 07/19/2022 1534   TRIG 163.0 (H) 07/19/2022 1534   HDL 34.10 (L) 07/19/2022 1534   CHOLHDL 3 07/19/2022 1534   VLDL 32.6 07/19/2022 1534   LDLCALC 48 07/19/2022 1534   LDLDIRECT 66.0 07/19/2022 1534    PHYSICAL EXAM:    VS:  BP 130/77 (BP Location: Left Arm, Patient Position: Sitting, Cuff Size: Normal)   Pulse 73   Ht 5\' 7"  (1.702 m)   Wt 229 lb (103.9 kg)   SpO2 96%   BMI 35.87 kg/m   BMI: Body mass index is 35.87 kg/m.  Physical Exam Vitals reviewed.  Constitutional:      Appearance: She is well-developed.  HENT:     Head: Normocephalic and atraumatic.  Eyes:     General:        Right eye: No discharge.        Left eye: No discharge.  Cardiovascular:     Rate and Rhythm: Normal rate and regular rhythm.     Pulses:          Posterior tibial pulses are 2+ on the right side and 2+ on the left side.     Heart sounds: S1 normal and S2 normal. Heart sounds not distant. No midsystolic click and no opening snap. Murmur heard.     Systolic murmur is present with a grade of 1/6 at the upper right  sternal border.     No friction rub.  Pulmonary:     Effort: Pulmonary effort is normal. No respiratory distress.     Breath sounds: Normal breath sounds. No decreased breath sounds, wheezing, rhonchi or rales.  Chest:     Chest wall: No tenderness.  Abdominal:     General: There is no distension.  Musculoskeletal:     Cervical back: Normal range of motion.  Skin:    General: Skin is warm and dry.     Nails: There is no clubbing.  Neurological:     Mental Status: She is alert and oriented to person, place, and time.  Psychiatric:        Speech: Speech normal.        Behavior: Behavior normal.        Thought Content: Thought content normal.        Judgment: Judgment normal.     Wt Readings from Last 3 Encounters:  03/24/23 229 lb (103.9 kg)  01/02/23 236 lb 9.6 oz (107.3 kg)  10/31/22 238 lb (108  kg)     ASSESSMENT & PLAN:   Paroxysmal atrial flutter: Maintaining sinus rhythm with Toprol.  Discontinue digoxin given maintenance of sinus rhythm, and in the context of advanced age.  CHA2DS2-VASc at least 8 (CHF, HTN, age x 2, stroke x 2, vascular disease, sex category).  She remains on apixaban 5 mg twice daily and does not meet reduced dosing criteria.  Obtain CBC and CMP.  Coronary artery calcification/HLD: No symptoms of chest pain.  She remains on apixaban in place of aspirin given underlying atrial flutter and history of extensive bilateral DVTs in an effort to minimize bleeding risk.  She otherwise will continue atorvastatin.  Check lipid panel, LFT, and direct LDL.  HFpEF/dyspnea: Her weight is down 11 pounds today when compared to her last clinic visit in our office.  Appears euvolemic and well compensated.  Remains on furosemide 40 mg daily with KCl repletion.  Defer addition of MRA or SGLT2 inhibitor given advanced age in an effort to minimize off target effect.  With exertional dyspnea check CBC and echo.  HTN: Blood pressure is well-controlled in the office today.  She remains on amlodipine and Toprol-XL.  History of bilateral DVTs: She remains on long-term anticoagulation with apixaban given atrial flutter.  Obtain labs as above.  Fatigue: Likely multifactorial.  Cannot exclude some component of pharmacotherapy as well.  Discontinue digoxin as above.  May need to consider de-escalation of Toprol-XL if symptoms persist.  Obtain echo to evaluate for new cardiomyopathy and CBC as above.    Disposition: F/u with Dr. Kirke Corin or an APP in 2 months.   Medication Adjustments/Labs and Tests Ordered: Current medicines are reviewed at length with the patient today.  Concerns regarding medicines are outlined above. Medication changes, Labs and Tests ordered today are summarized above and listed in the Patient Instructions accessible in Encounters.   Signed, Eula Listen, PA-C 03/24/2023  3:28 PM      [facility] [address] [place], [place] [address] [phone]

## 2023-03-24 NOTE — Patient Instructions (Signed)
Medication Instructions:  Your physician recommends the following medication changes.  STOP TAKING: Digoxin   *If you need a refill on your cardiac medications before your next appointment, please call your pharmacy*   Lab Work: Your provider would like for you to have following labs drawn today CBC, CMeT, Direct LDL, Lipid panel.   If you have labs (blood work) drawn today and your tests are completely normal, you will receive your results only by: MyChart Message (if you have MyChart) OR A paper copy in the mail If you have any lab test that is abnormal or we need to change your treatment, we will call you to review the results.   Testing/Procedures: Your physician has requested that you have an echocardiogram. Echocardiography is a painless test that uses sound waves to create images of your heart. It provides your doctor with information about the size and shape of your heart and how well your heart's chambers and valves are working.   You may receive an ultrasound enhancing agent through an IV if needed to better visualize your heart during the echo. This procedure takes approximately one hour.  There are no restrictions for this procedure.  This will take place at [address] [facility] [address], [place] [address]  Please note: We ask at that you not bring children with you during ultrasound (echo/ vascular) testing. Due to room size and safety concerns, children are not allowed in the ultrasound rooms during exams. Our front office staff cannot provide observation of children in our lobby area while testing is being conducted. An adult accompanying a patient to their appointment will only be allowed in the ultrasound room at the discretion of the ultrasound technician under special circumstances. We apologize for any inconvenience.    Follow-Up: At [facility], you and your health needs are our priority.  As part of our continuing mission to provide you  with exceptional heart care, we have created designated Provider Care Teams.  These Care Teams include your primary Cardiologist (physician) and Advanced Practice Providers (APPs -  Physician Assistants and Nurse Practitioners) who all work together to provide you with the care you need, when you need it.  We recommend signing up for the patient portal called "MyChart".  Sign up information is provided on this After Visit Summary.  MyChart is used to connect with patients for Virtual Visits (Telemedicine).  Patients are able to view lab/test results, encounter notes, upcoming appointments, etc.  Non-urgent messages can be sent to your provider as well.   To learn more about what you can do with MyChart, go to [email or web address].    Your next appointment:   2 month(s)  Provider:   You may see Lorine Bears, MD or one of the following Advanced Practice Providers on your designated Care Team:   Eula Listen, [place]

## 2023-03-25 LAB — COMPREHENSIVE METABOLIC PANEL
ALT: 15 [IU]/L (ref 0–32)
AST: 17 [IU]/L (ref 0–40)
Albumin: 4.2 g/dL (ref 3.7–4.7)
Alkaline Phosphatase: 123 [IU]/L — ABNORMAL HIGH (ref 44–121)
BUN/Creatinine Ratio: 15 (ref 12–28)
BUN: 14 mg/dL (ref 8–27)
Bilirubin Total: 0.5 mg/dL (ref 0.0–1.2)
CO2: 26 mmol/L (ref 20–29)
Calcium: 9.6 mg/dL (ref 8.7–10.3)
Chloride: 105 mmol/L (ref 96–106)
Creatinine, Ser: 0.95 mg/dL (ref 0.57–1.00)
Globulin, Total: 2.4 g/dL (ref 1.5–4.5)
Glucose: 87 mg/dL (ref 70–99)
Potassium: 4.2 mmol/L (ref 3.5–5.2)
Sodium: 146 mmol/L — ABNORMAL HIGH (ref 134–144)
Total Protein: 6.6 g/dL (ref 6.0–8.5)
eGFR: 59 mL/min/{1.73_m2} — ABNORMAL LOW (ref >59)

## 2023-03-25 LAB — LIPID PANEL
Chol/HDL Ratio: 3 {ratio} (ref 0.0–4.4)
Cholesterol, Total: 119 mg/dL (ref 100–199)
HDL: 40 mg/dL (ref >39)
LDL Chol Calc (NIH): 60 mg/dL (ref 0–99)
Triglycerides: 104 mg/dL (ref 0–149)
VLDL Cholesterol Cal: 19 mg/dL (ref 5–40)

## 2023-03-25 LAB — CBC
Hematocrit: 39.6 % (ref 34.0–46.6)
Hemoglobin: 12.7 g/dL (ref 11.1–15.9)
MCH: 28.2 pg (ref 26.6–33.0)
MCHC: 32.1 g/dL (ref 31.5–35.7)
MCV: 88 fL (ref 79–97)
Platelets: 253 10*3/uL (ref 150–450)
RBC: 4.5 x10E6/uL (ref 3.77–5.28)
RDW: 13.8 % (ref 11.7–15.4)
WBC: 7 10*3/uL (ref 3.4–10.8)

## 2023-03-25 LAB — LDL CHOLESTEROL, DIRECT: LDL Direct: 64 mg/dL (ref 0–99)

## 2023-04-13 ENCOUNTER — Ambulatory Visit: Payer: Medicare PPO | Attending: Physician Assistant

## 2023-04-13 DIAGNOSIS — R0602 Shortness of breath: Secondary | ICD-10-CM | POA: Diagnosis not present

## 2023-04-14 LAB — ECHOCARDIOGRAM COMPLETE
AV Mean grad: 4 mm[Hg]
AV Peak grad: 7.1 mm[Hg]
Ao pk vel: 1.33 m/s
Area-P 1/2: 3.27 cm2
S' Lateral: 3 cm

## 2023-04-17 ENCOUNTER — Other Ambulatory Visit: Payer: Self-pay | Admitting: Internal Medicine

## 2023-04-17 ENCOUNTER — Other Ambulatory Visit: Payer: Self-pay | Admitting: Family Medicine

## 2023-04-17 DIAGNOSIS — G8929 Other chronic pain: Secondary | ICD-10-CM

## 2023-05-08 ENCOUNTER — Other Ambulatory Visit: Payer: Self-pay | Admitting: Cardiovascular Disease

## 2023-05-08 ENCOUNTER — Ambulatory Visit: Payer: Self-pay | Admitting: Urology

## 2023-05-08 ENCOUNTER — Ambulatory Visit (INDEPENDENT_AMBULATORY_CARE_PROVIDER_SITE_OTHER): Admitting: Urology

## 2023-05-08 DIAGNOSIS — N3281 Overactive bladder: Secondary | ICD-10-CM

## 2023-05-08 DIAGNOSIS — E876 Hypokalemia: Secondary | ICD-10-CM

## 2023-05-08 MED ORDER — OXYBUTYNIN CHLORIDE ER 10 MG PO TB24: 10.0000 mg | ORAL_TABLET | Freq: Every day | ORAL | 11 refills | Status: DC

## 2023-05-08 NOTE — Progress Notes (Signed)
 05/08/2023 2:34 PM   Charlett Nose [birth year] [id number]  Referring provider: Sherlene Shams, MD [address] [place],  [place] [address]  No chief complaint on file.   HPI: I was consulted to assess the patient is urinary incontinence.  She is not a strong historian but I think she has urge incontinence but also leakage without awareness into her 6 pads or more per day that are moderately wet.  She has moderately severe bedwetting.  She voids every 1-2 hours and gets up least twice a night   She was nonspecific in terms of symptoms but she thinks she gets 5 or 6 infections a year.  In 2022 she had a negative culture, Streptococcus in the urine, and 1 positive culture.   She was given trospium but it was not filled due to the potential of a drug drug interaction.  She had a previous stroke.  She uses a cane.     Patient will be treated as an overactive bladder with bedwetting and urge incontinence and leakage with awareness.  She has a functional component.  She is likely getting recurrent urinary tract infections.  I did not order an ultrasound but may in the future.    Conversation was not crystal-clear but it appears that she took Myrbetriq 25 mg samples and they really did not help much.  When she tried to fill it there may have been a potential for drug-drug interaction.  She never took [place] since pharmacist it causes hypertension.   I am not convinced that she had any UTI symptoms today but she had vague feelings in the pelvic area     We did a cath culture was sent for culture. Call if positive. In the future I may or may not put her on prophylaxis. I gave her 4 weeks Gemtesa samples and prescription and come back for pelvic examination and cystoscopy.   Last urine culture positive Clinically not infected.  Denies stress incontinence.  Failed Gemtesa and still has mixed incontinence noted high-volume   On pelvic examination she had grade 2 hypermobility the  bladder neck and negative cough test after cystoscopy.  No significant prolapse.  Based upon redundant labia it would be difficult for her to perform clean intermittent catheterization   Cystoscopy: Patient underwent flexible cystoscopy.  Bladder mucosa and trigone were normal.  She had white flecks in the urine.  Urine was aspirated and sent for culture    Patient has high-volume urge incontinence and bedwetting.  Based upon age and comorbidities some of the refractory treatments such as Botox and/or InterStim may not be appropriate.  Role of oxybutynin discussed.  Call if culture positive.     The patient's daughter was very helpful and really think she is getting a lot of UTIs.  Pathophysiology discussed.  Probiotics discussed.  Placed patient on trimethoprim 100 mg 30 x 11 and the daughter felt this was a good idea.  Hopefully this will help down regulate some of her incontinence and this was discussed.  For the time being she will stay on Gemtesa hoping that it may work better with the trimethoprim.   They do not want to start oxybutynin at this stage.  They want to get urodynamics and proceed accordingly.  They may or may not start oxybutynin in the future or trospium  Today Frequency stable.  Last culture was positive.  On urodynamics patient did not void and was catheterized to 25 mL.  Max bladder capacity  sugar and 10 mL.  Bladder had increased bladder sensation.  Bladder was unstable reaching pressures of 78 cm of water.  She had urgency and high-volume urge incontinence.  No stress incontinence generating Valsalva pressure of 108 cm of water.  During voluntary voiding she voided 266 mL with a maximal flow of 3 mL/s.  Max voiding pressure approximately 70 cm of water.  EMG activity normal.  Bladder neck distended a centimeter.  The details of the urodynamics are signed dictated  The patient stopped the trimethoprim perhaps because she was still leaking with frequency.  Clinically not infected  today.  I apologized to the patient that she had to wait so long to go over the test results.  I spent many minutes sorting out her medications and describing her past history.  The conversation became somewhat circular but it appears that she has failed at least Gemtesa samples, Myrbetriq samples, but may have not filled 1 or both of them based on conversations with the pharmacist.  Having said that I believe she has failed both medications as she is failed the samples.  She also has not tried trospium before.  I would often be summarizing her past treatment path and she would note that she had not taken those medications.  The patient mentioned oxybutynin but I cannot tell definitely if she has tried it.  Still has urge incontinence and reviewed the urodynamics with her and her daughter.  Both the patient and I became a little bit frustrated because of the circular nature of the conversation and how treatments documented in the medical record according to the patient's memory have not been done.  We all agreed that there is been no clinical evidence of breakthrough UTIs when she was taking the trimethoprim.  I empathized with her that is difficult to remember medication names      PMH: Past Medical History:  Diagnosis Date   Abnormal gait 09/22/2021   Abnormal weight gain 02/24/2021   Allergic rhinitis    Anxiety    Benign neoplasm of breast 2013   Bilateral shoulder pain 01/13/2020   Cardiac arrhythmia    Cardiomegaly    Cataract    Cervicalgia 01/13/2020   CHF (congestive heart failure) (HCC)    Chronic back pain 12/26/2018   Severe stenosis L spine       Chronic cough 04/15/2016   Chronic knee pain 07/22/2020   Chronic left shoulder pain 06/07/2019   Colon cancer screening 02/24/2021   Compulsive eating patterns    Compulsive overeating    Compulsive overeating 01/01/2015   Degenerative lumbar spinal stenosis 09/20/2017   Depression    Depression with anxiety 12/21/2006    Diastolic heart failure (HCC)    LVH with free wall thickness 1.4 cm EF 60% Dr. Katrinka Blazing    Diffuse cystic mastopathy 2013   Diverticulosis of large intestine without perforation or abscess without bleeding    Dysuria 06/03/2020   Edema    Enlarged clavicle 12/29/2021   Epistaxis    noted 12/20/18    Epistaxis 01/07/2019   Essential hypertension 12/21/2006   Generalized abdominal pain 01/03/2019   GI bleeding    in ~2018    Gout    Grief 01/13/2020   Hiatal hernia    HTN (hypertension)    Hyperlipidemia    Intertrigo 10/01/2020   Iron deficiency anemia 01/03/2019   Kidney cysts 01/13/2020   Leg DVT (deep venous thromboembolism), acute, bilateral (HCC) 12/26/2018   Noted [place] legs 09/02/18  b/l       Lower GI bleed 10/03/2016   Lump or mass in breast 2012   Memory loss 03/06/2019   Middle cerebral artery embolism, 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   OA (osteoarthritis) of knee    with injections   Obesity    OSA (obstructive sleep apnea)    not on cpap   Other abnormal glucose    Partial nontraumatic rupture of right rotator cuff 09/22/2015   Pelvic floor dysfunction 06/06/2019   Peptic stricture of esophagus    Personal history of tobacco use, presenting hazards to health    Rectal bleeding 02/24/2021   Recurrent falls 01/13/2020   Sciatica    Seborrheic keratoses 04/15/2016   Sinus infection 2010   Spinal stenosis    lumbar    Status post bilateral knee replacements 05/04/2016   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   09/01/2018   Established GNA with thrombectomy intracranial in hospital    Stroke [facility])    Unspecified sleep apnea    Urge incontinence of urine 09/09/2011   Urine frequency 02/17/2021   UTI (urinary tract infection)    e coli 06/2020    Surgical History: Past Surgical History:  Procedure Laterality Date   adenosine cardiolite  1/08   low risk    APPENDECTOMY     BREAST BIOPSY     02/28/11 negative   BREAST LUMPECTOMY     right x1 ('89) left x2 ('70s, '90)   CARDIAC  CATHETERIZATION  2001   normal per pt   COLONOSCOPY  11/02   diverticulosis   COLONOSCOPY  9/06   diverticulosis, hemorhoids   COLONOSCOPY N/A 10/01/2016   Procedure: COLONOSCOPY;  Surgeon: Iva Boop, MD;  Location: [facility];  Service: Endoscopy;  Laterality: N/A;   dexa  3/02   normal   EYE SURGERY  2012   cataract   IR CT HEAD LTD  09/01/2018   IR PERCUTANEOUS ART THROMBECTOMY/INFUSION INTRACRANIAL INC DIAG ANGIO  09/01/2018   JOINT REPLACEMENT     knee b/l 2011   knee replacement Right 9/11   R. Dr. Priscille Kluver   RADIOLOGY WITH ANESTHESIA N/A 09/01/2018   Procedure: IR WITH ANESTHESIA;  Surgeon: Julieanne Cotton, MD;  Location: [facility] OR;  Service: Radiology;  Laterality: N/A;   sleep study  5/05   TONSILLECTOMY     TOTAL ABDOMINAL HYSTERECTOMY     fibroids, age of 56   TOTAL KNEE ARTHROPLASTY Left 2012   US TRANSVAGINAL PELVIC MODIFIED  2000, 2001    Home Medications:  Allergies as of 05/08/2023       Reactions   Lisinopril Other (See Comments)   REACTION: dizziness        Medication List        Accurate as of May 08, 2023  2:34 PM. If you have any questions, ask your nurse or doctor.          allopurinol 100 MG tablet Commonly known as: ZYLOPRIM TAKE 2 TABLETS BY MOUTH EVERY DAY   ALPRAZolam 0.25 MG tablet Commonly known as: XANAX Take 1 tablet (0.25 mg total) by mouth 2 (two) times daily as needed for anxiety.   amLODipine 5 MG tablet Commonly known as: NORVASC Take 1 tablet (5 mg total) by mouth daily. Take additional 5 mg in pm if BP>130/>80   atorvastatin 20 MG tablet Commonly known as: LIPITOR Take 1 tablet (20 mg total) by mouth at bedtime.   DULoxetine 30 MG capsule Commonly known as: CYMBALTA TAKE 1  CAPSULE BY MOUTH EVERY DAY   Eliquis 5 MG Tabs tablet Generic drug: apixaban TAKE 1 TABLET BY MOUTH TWICE A DAY   furosemide 40 MG tablet Commonly known as: LASIX Take 1 tablet (40 mg total) by mouth daily.   gabapentin 300 MG  capsule Commonly known as: NEURONTIN Take 1 tablet in the morning and 2 tablets at nightTake 1 tablet in the morning and 2 tablets at night   Gemtesa 75 MG Tabs Generic drug: Vibegron Take 1 tablet (75 mg total) by mouth daily.   hydrocortisone 2.5 % cream Apply topically 2 (two) times daily.   Magnesium 250 MG Tabs Take 250 mg by mouth daily.   metoprolol succinate 100 MG 24 hr tablet Commonly known as: TOPROL-XL Take with or immediately following a meal.   nystatin cream Commonly known as: MYCOSTATIN Apply 1 Application topically 3 (three) times daily. Prn right back   potassium chloride SA 20 MEQ tablet Commonly known as: KLOR-CON M TAKE 1 TABLET (20 MEQ TOTAL) BY MOUTH DAILY. OVERDUE FOLLOW UP VISIT. PLEASE CALL OFFICE TO SCHEDULE APPOINTMENT PRIOR TO NEXT REFILL (FIRST ATTEMPT)   triamcinolone cream 0.1 % Commonly known as: KENALOG APPLY 1 APPLICATION TOPICALLY 2 (TWO) TIMES DAILY. TO PATCHY RASH        Allergies:  Allergies  Allergen Reactions   Lisinopril Other (See Comments)    REACTION: dizziness    Family History: Family History  Problem Relation Age of Onset   Hypertension Mother        (a lot of animosity in their relationship)   Heart failure Mother    Stroke Father    Breast cancer Sister    Cancer Sister        breast cancer   Prostate cancer Brother    Cancer Brother        prostate    Hepatitis Brother        Hep C; alcoholism (terminal)   Drug abuse Brother        died 40   Other Brother        drug addiction   Gout Other        whole family     Social History:  reports that she has quit smoking. Her smoking use included cigarettes. She has a 10 pack-year smoking history. She has never used smokeless tobacco. She reports current alcohol use. She reports that she does not use drugs.  ROS:                                        Physical Exam: There were no vitals taken for this visit.  Constitutional:   Alert and oriented, No acute distress. HEENT: [place] AT, moist mucus membranes.  Trachea midline, no masses.   Laboratory Data: Lab Results  Component Value Date   WBC 7.0 03/24/2023   HGB 12.7 03/24/2023   HCT 39.6 03/24/2023   MCV 88 03/24/2023   PLT 253 03/24/2023    Lab Results  Component Value Date   CREATININE 0.95 03/24/2023    No results found for: "PSA"  No results found for: "TESTOSTERONE"  Lab Results  Component Value Date   HGBA1C 6.3 07/19/2022    Urinalysis    Component Value Date/Time   COLORURINE YELLOW 08/07/2020 1639   APPEARANCEUR Clear 01/02/2023 1059   LABSPEC 1.024 08/07/2020 1639   PHURINE 5.5 08/07/2020 1639  GLUCOSEU Negative 01/02/2023 1059   GLUCOSEU Color Interference (A) 06/03/2020 0933   HGBUR NEGATIVE 08/07/2020 1639   BILIRUBINUR Negative 01/02/2023 1059   KETONESUR NEGATIVE 08/07/2020 1639   PROTEINUR Negative 01/02/2023 1059   PROTEINUR TRACE (A) 08/07/2020 1639   UROBILINOGEN 0.2 03/07/2022 1636   UROBILINOGEN Color Interference (A) 06/03/2020 0933   NITRITE Negative 01/02/2023 1059   NITRITE NEGATIVE 08/07/2020 1639   LEUKOCYTESUR Negative 01/02/2023 1059   LEUKOCYTESUR NEGATIVE 08/07/2020 1639    Pertinent Imaging:   Assessment & Plan: I reviewed the history and presentation in detail with the patient and her daughter.  Ideally I would like to try to control her bladder infections with prophylaxis.  We agreed to try oxybutynin.  She normally cannot collect her own urine but did not want to be catheterized.  We were able to find a sterile hat. She understands that it will be better to sterilize her urine otherwise oxybutynin may not be effective. I mentioned percutaneous tibial nerve stimulation and Botox.  She declined percutaneous tibial nerve stimulation in my office in 2023 when she saw Dr. Alvester Morin.  She was a bit upset today because she was not able to freely give a sample and did not want to be catheterized.  Her daughter was  very supportive and we were able to get the culture with a sterile hat  There are no diagnoses linked to this encounter.  No follow-ups on file.  Martina Sinner, MD  [facility] [address], [address] [place], [place] [address] [phone]

## 2023-05-10 LAB — CULTURE, URINE COMPREHENSIVE

## 2023-05-23 ENCOUNTER — Ambulatory Visit (INDEPENDENT_AMBULATORY_CARE_PROVIDER_SITE_OTHER): Payer: Medicare PPO | Admitting: *Deleted

## 2023-05-23 VITALS — BP 130/84 | HR 80 | Temp 97.8°F | Ht 67.0 in | Wt 233.0 lb

## 2023-05-23 DIAGNOSIS — Z780 Asymptomatic menopausal state: Secondary | ICD-10-CM

## 2023-05-23 DIAGNOSIS — Z0000 Encounter for general adult medical examination without abnormal findings: Secondary | ICD-10-CM

## 2023-05-23 DIAGNOSIS — Z1231 Encounter for screening mammogram for malignant neoplasm of breast: Secondary | ICD-10-CM

## 2023-05-23 NOTE — Patient Instructions (Signed)
 Ms. Moorefield , Thank you for taking time to come for your Medicare Wellness Visit. I appreciate your ongoing commitment to your health goals. Please review the following plan we discussed and let me know if I can assist you in the future.   Referrals/Orders/Follow-Ups/Clinician Recommendations: An order has been placed for your mammogram and bone density. Remember to update your shingles vaccines  was only able to find one documented and two are recommended. You have an order for:  []   2D Mammogram  [x]   3D Mammogram  [x]   Bone Density     Please call for appointment:  [facility]  [address] [place] [address] [phone]    Make sure to wear two-piece clothing.  No lotions, powders, or deodorants the day of the appointment. Make sure to bring picture ID and insurance card.  Bring list of medications you are currently taking including any supplements.    This is a list of the screening recommended for you and due dates:  Health Maintenance  Topic Date Due   COVID-19 Vaccine (5 - 2024-25 season) 10/23/2022   Zoster (Shingles) Vaccine (2 of 2) 11/10/2022   Mammogram  06/20/2023   Flu Shot  09/22/2023   Medicare Annual Wellness Visit  05/22/2024   Pneumonia Vaccine  Completed   DEXA scan (bone density measurement)  Completed   HPV Vaccine  Aged Out   DTaP/Tdap/Td vaccine  Discontinued    Advanced directives: (Copy Requested) Please bring a copy of your health care power of attorney and living will to the office to be added to your chart at your convenience. You can mail to [facility] [address] [place], [place] [address] or email to [email or web address]  Next Medicare Annual Wellness Visit scheduled for next year: Yes 05/29/24 @ 2:20

## 2023-05-23 NOTE — Progress Notes (Signed)
 Subjective:   Monica [name] is a 88 y.o. who presents for a Medicare Wellness preventive visit.  Visit Complete: In person   Persons Participating in Visit: Patient.  AWV Questionnaire: No: Patient Medicare AWV questionnaire was not completed prior to this visit.  Cardiac Risk Factors include: advanced age (>23men, >59 women);dyslipidemia;hypertension;obesity (BMI >30kg/m2)     Objective:    Today's Vitals   05/23/23 1302 05/23/23 1311  BP: 130/84   Pulse: 80   Temp: 97.8 F (36.6 C)   TempSrc: Oral   Weight: 233 lb (105.7 kg)   Height: 5\' 7"  (1.702 m)   PainSc:  9    Body mass index is 36.49 kg/m.     05/23/2023    1:34 PM 02/08/2022   12:46 PM 04/01/2020    2:02 PM 01/21/2020    3:16 PM 01/02/2019    6:27 PM 09/01/2018    8:54 PM 12/12/2016    3:06 PM  Advanced Directives  Does Patient Have a Medical Advance Directive? Yes No No Yes No No Yes  Type of Estate agent of [place];Living will   Healthcare Power of [place];Living will   Healthcare Power of [place];Living will  Does patient want to make changes to medical advance directive?    No - Patient declined     Copy of Healthcare Power of Attorney in Chart? No - copy requested   No - copy requested     Would patient like information on creating a medical advance directive?  No - Patient declined   No - Patient declined No - Patient declined     Current Medications (verified) Outpatient Encounter Medications as of 05/23/2023  Medication Sig   allopurinol (ZYLOPRIM) 100 MG tablet TAKE 2 TABLETS BY MOUTH EVERY DAY   ALPRAZolam (XANAX) 0.25 MG tablet Take 1 tablet (0.25 mg total) by mouth 2 (two) times daily as needed for anxiety.   amLODipine (NORVASC) 5 MG tablet Take 1 tablet (5 mg total) by mouth daily. Take additional 5 mg in pm if BP>130/>80   atorvastatin (LIPITOR) 20 MG tablet Take 1 tablet (20 mg total) by mouth at bedtime.   DULoxetine (CYMBALTA) 30 MG capsule TAKE 1 CAPSULE BY  MOUTH EVERY DAY   ELIQUIS 5 MG TABS tablet TAKE 1 TABLET BY MOUTH TWICE A DAY   furosemide (LASIX) 40 MG tablet Take 1 tablet (40 mg total) by mouth daily.   hydrocortisone 2.5 % cream Apply topically 2 (two) times daily.   magnesium oxide (MAG-OX) 400 (240 Mg) MG tablet Take 400 mg by mouth daily.   metoprolol succinate (TOPROL-XL) 100 MG 24 hr tablet Take with or immediately following a meal.   nystatin cream (MYCOSTATIN) Apply 1 Application topically 3 (three) times daily. Prn right back   oxybutynin (DITROPAN-XL) 10 MG 24 hr tablet Take 1 tablet (10 mg total) by mouth daily.   potassium chloride SA (KLOR-CON M) 20 MEQ tablet TAKE 1 TABLET (20 MEQ TOTAL) BY MOUTH DAILY. OVERDUE FOLLOW UP VISIT. PLEASE CALL OFFICE TO SCHEDULE APPOINTMENT PRIOR TO NEXT REFILL (FIRST ATTEMPT)   triamcinolone cream (KENALOG) 0.1 % APPLY 1 APPLICATION TOPICALLY 2 (TWO) TIMES DAILY. TO PATCHY RASH   gabapentin (NEURONTIN) 300 MG capsule Take 1 tablet in the morning and 2 tablets at nightTake 1 tablet in the morning and 2 tablets at night (Patient not taking: Reported on 05/23/2023)   Vibegron (GEMTESA) 75 MG TABS Take 1 tablet (75 mg total) by mouth daily. (Patient not  taking: Reported on 05/23/2023)   [DISCONTINUED] Magnesium 250 MG TABS Take 250 mg by mouth daily. (Patient not taking: Reported on 05/23/2023)   No facility-administered encounter medications on file as of 05/23/2023.    Allergies (verified) Lisinopril   History: Past Medical History:  Diagnosis Date   Abnormal gait 09/22/2021   Abnormal weight gain 02/24/2021   Allergic rhinitis    Anxiety    Benign neoplasm of breast 2013   Bilateral shoulder pain 01/13/2020   Cardiac arrhythmia    Cardiomegaly    Cataract    Cervicalgia 01/13/2020   CHF (congestive heart failure) (HCC)    Chronic back pain 12/26/2018   Severe stenosis L spine       Chronic cough 04/15/2016   Chronic knee pain 07/22/2020   Chronic left shoulder pain 06/07/2019   Colon  cancer screening 02/24/2021   Compulsive eating patterns    Compulsive overeating    Compulsive overeating 01/01/2015   Degenerative lumbar spinal stenosis 09/20/2017   Depression    Depression with anxiety 12/21/2006   Diastolic heart failure (HCC)    LVH with free wall thickness 1.4 cm EF 60% Dr. Katrinka Blazing    Diffuse cystic mastopathy 2013   Diverticulosis of large intestine without perforation or abscess without bleeding    Dysuria 06/03/2020   Edema    Enlarged clavicle 12/29/2021   Epistaxis    noted 12/20/18    Epistaxis 01/07/2019   Essential hypertension 12/21/2006   Generalized abdominal pain 01/03/2019   GI bleeding    in ~2018    Gout    Grief 01/13/2020   Hiatal hernia    HTN (hypertension)    Hyperlipidemia    Intertrigo 10/01/2020   Iron deficiency anemia 01/03/2019   Kidney cysts 01/13/2020   Leg DVT (deep venous thromboembolism), acute, bilateral (HCC) 12/26/2018   Noted [place] legs 09/02/18 b/l       Lower GI bleed 10/03/2016   Lump or mass in breast 2012   Memory loss 03/06/2019   Middle cerebral artery embolism, 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   OA (osteoarthritis) of knee    with injections   Obesity    OSA (obstructive sleep apnea)    not on cpap   Other abnormal glucose    Partial nontraumatic rupture of right rotator cuff 09/22/2015   Pelvic floor dysfunction 06/06/2019   Peptic stricture of esophagus    Personal history of tobacco use, presenting hazards to health    Rectal bleeding 02/24/2021   Recurrent falls 01/13/2020   Sciatica    Seborrheic keratoses 04/15/2016   Sinus infection 2010   Spinal stenosis    lumbar    Status post bilateral knee replacements 05/04/2016   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   09/01/2018   Established GNA with thrombectomy intracranial in hospital    Stroke [facility])    Unspecified sleep apnea    Urge incontinence of urine 09/09/2011   Urine frequency 02/17/2021   UTI (urinary tract infection)    e coli 06/2020   Past Surgical  History:  Procedure Laterality Date   adenosine cardiolite  1/08   low risk    APPENDECTOMY     BREAST BIOPSY     02/28/11 negative   BREAST LUMPECTOMY     right x1 ('89) left x2 ('70s, '90)   CARDIAC CATHETERIZATION  2001   normal per pt   COLONOSCOPY  11/02   diverticulosis   COLONOSCOPY  9/06   diverticulosis, hemorhoids   COLONOSCOPY N/A  10/01/2016   Procedure: COLONOSCOPY;  Surgeon: Iva Boop, MD;  Location: [facility];  Service: Endoscopy;  Laterality: N/A;   dexa  3/02   normal   EYE SURGERY  2012   cataract   IR CT HEAD LTD  09/01/2018   IR PERCUTANEOUS ART THROMBECTOMY/INFUSION INTRACRANIAL INC DIAG ANGIO  09/01/2018   JOINT REPLACEMENT     knee b/l 2011   knee replacement Right 9/11   R. Dr. Priscille Kluver   RADIOLOGY WITH ANESTHESIA N/A 09/01/2018   Procedure: IR WITH ANESTHESIA;  Surgeon: Julieanne Cotton, MD;  Location: [facility] OR;  Service: Radiology;  Laterality: N/A;   sleep study  5/05   TONSILLECTOMY     TOTAL ABDOMINAL HYSTERECTOMY     fibroids, age of 38   TOTAL KNEE ARTHROPLASTY Left 2012   US TRANSVAGINAL PELVIC MODIFIED  2000, 2001   Family History  Problem Relation Age of Onset   Hypertension Mother        (a lot of animosity in their relationship)   Heart failure Mother    Stroke Father    Breast cancer Sister    Cancer Sister        breast cancer   Prostate cancer Brother    Cancer Brother        prostate    Hepatitis Brother        Hep C; alcoholism (terminal)   Drug abuse Brother        died 27   Other Brother        drug addiction   Gout Other        whole family    Social History   Socioeconomic History   Marital status: Widowed    Spouse name: Not on file   Number of children: 1   Years of education: Not on file   Highest education level: Not on file  Occupational History   Occupation: Retired school principal    Employer: RETIRED  Tobacco Use   Smoking status: Former    Current packs/day: 0.50    Average packs/day: 0.5  packs/day for 20.0 years (10.0 ttl pk-yrs)    Types: Cigarettes   Smokeless tobacco: Never   Tobacco comments:    quit approx. 20 years ago  Vaping Use   Vaping status: Never Used  Substance and Sexual Activity   Alcohol use: Yes    Alcohol/week: 0.0 standard drinks of alcohol    Comment: wine occasional   Drug use: No   Sexual activity: Never  Other Topics Concern   Not on file  Social History Narrative   Widowed x 30+ years as of 12/2018    1 daughter. Retired principal. Has had to take care of sick family members    Masters degree retired Lawyer    Social Drivers of Corporate investment banker Strain: Low Risk  (05/23/2023)   Overall Financial Resource Strain (CARDIA)    Difficulty of Paying Living Expenses: Not hard at all  Food Insecurity: No Food Insecurity (05/23/2023)   Hunger Vital Sign    Worried About Running Out of Food in the Last Year: Never true    Ran Out of Food in the Last Year: Never true  Transportation Needs: No Transportation Needs (05/23/2023)   PRAPARE - Administrator, Civil Service (Medical): No    Lack of Transportation (Non-Medical): No  Physical Activity: Inactive (05/23/2023)   Exercise Vital Sign    Days of Exercise per Week: 0 days  Minutes of Exercise per Session: 0 min  Stress: Stress Concern Present (05/23/2023)   [employer] of Occupational Health - Occupational Stress Questionnaire    Feeling of Stress : Very much  Social Connections: Socially Isolated (05/23/2023)   Social Connection and Isolation Panel [NHANES]    Frequency of Communication with Friends and Family: Twice a week    Frequency of Social Gatherings with Friends and Family: Twice a week    Attends Religious Services: Never    Database administrator or Organizations: No    Attends Banker Meetings: Never    Marital Status: Widowed    Tobacco Counseling Counseling given: Not Answered Tobacco comments: quit approx. 20 years ago    Clinical  Intake:  Pre-visit preparation completed: Yes  Pain : 0-10 Pain Score: 9  Pain Type: Chronic pain (arthritis) Pain Location: Arm (arthritis) Pain Onset: More than a month ago Pain Frequency: Constant     BMI - recorded: 36.49 Nutritional Status: BMI > 30  Obese Nutritional Risks: None Diabetes: No  Lab Results  Component Value Date   HGBA1C 6.3 07/19/2022   HGBA1C 6.4 04/19/2022   HGBA1C 6.0 (A) 12/29/2021     How often do you need to have someone help you when you read instructions, pamphlets, or other written materials from your doctor or pharmacy?: 1 - Never  Interpreter Needed?: No  Information entered by :: R. Sheniah Supak LPN   Activities of Daily Living     05/23/2023    1:13 PM  In your present state of health, do you have any difficulty performing the following activities:  Hearing? 1  Vision? 0  Comment glasses  Difficulty concentrating or making decisions? 1  Walking or climbing stairs? 1  Dressing or bathing? 0  Doing errands, shopping? 0  Preparing Food and eating ? N  Using the Toilet? N  In the past six months, have you accidently leaked urine? Y  Do you have problems with loss of bowel control? N  Managing your Medications? N  Managing your Finances? N  Housekeeping or managing your Housekeeping? Y  Comment daughter helps some    Patient Care Team: Sherlene Shams, MD as PCP - General (Internal Medicine) Iran Ouch, MD as PCP - Cardiology (Cardiology) Kieth Brightly, MD (General Surgery) [place], Audrie Gallus, MD as Consulting Physician (Family Medicine) Alfredo Martinez, MD as Consulting Physician (Urology)  Indicate any recent Medical Services you may have received from other than [facility] providers in the past year (date may be approximate).     Assessment:   This is a routine wellness examination for Elodie.  Hearing/Vision screen Hearing Screening - Comments:: Some difficulty Vision Screening - Comments:: glasses   Goals  Addressed             This Visit's Progress    Patient Stated       Wants to get more active with people       Depression Screen     05/23/2023    1:27 PM 07/19/2022    2:46 PM 06/17/2022   11:46 AM 04/19/2022   11:35 AM 03/04/2022    3:18 PM 02/09/2022    2:08 PM 02/08/2022   12:50 PM  PHQ 2/9 Scores  PHQ - 2 Score 2 1 0 2 1 0 0  PHQ- 9 Score 13 1 1 8 2 2  0    Fall Risk     05/23/2023    1:19 PM 07/19/2022  2:46 PM 06/17/2022   11:46 AM 04/19/2022   11:35 AM 03/04/2022    3:17 PM  Fall Risk   Falls in the past year? 1 0 0 1 0  Number falls in past yr: 1 0 0 0 0  Injury with Fall? 1 0 0 0 0  Risk for fall due to : History of fall(s) No Fall Risks  History of fall(s) Impaired mobility  Risk for fall due to: Comment fall out of the bed      Follow up Falls evaluation completed;Falls prevention discussed Falls evaluation completed  Falls evaluation completed Falls evaluation completed    MEDICARE RISK AT HOME:  Medicare Risk at Home Any stairs in or around the home?: Yes If so, are there any without handrails?: No Home free of loose throw rugs in walkways, pet beds, electrical cords, etc?: Yes Adequate lighting in your home to reduce risk of falls?: Yes Life alert?: Yes Use of a cane, walker or w/c?: Yes Grab bars in the bathroom?: Yes Shower chair or bench in shower?: Yes Elevated toilet seat or a handicapped toilet?: Yes  TIMED UP AND GO:  Was the test performed?  Yes  Length of time to ambulate 10 feet: 10 sec Gait slow and steady with assistive device  Cognitive Function: 6CIT completed        05/23/2023    1:34 PM 02/08/2022   12:52 PM  6CIT Screen  What Year? 0 points 0 points  What month? 0 points 0 points  What time? 0 points 0 points  Count back from 20 0 points 0 points  Months in reverse 0 points 0 points  Repeat phrase 0 points 0 points  Total Score 0 points 0 points    Immunizations Immunization History  Administered Date(s) Administered    Fluad Quad(high Dose 65+) 12/05/2018, 12/25/2019, 10/27/2020, 12/29/2021   Influenza Split 12/01/2010, 01/10/2012   Influenza, High Dose Seasonal PF 12/03/2015   Influenza,inj,Quad PF,6+ Mos 11/27/2012, 12/03/2013, 12/31/2014   PFIZER Comirnaty(Gray Top)Covid-19 Tri-Sucrose Vaccine 08/19/2020   PFIZER(Purple Top)SARS-COV-2 Vaccination 03/28/2019, 04/18/2019, 12/25/2019   PNEUMOCOCCAL CONJUGATE-20 02/09/2022   Pneumococcal Conjugate-13 02/11/2015   Pneumococcal Polysaccharide-23 08/01/1996, 12/03/2013   Td 04/20/1995   Tdap 12/24/2022    Screening Tests Health Maintenance  Topic Date Due   Zoster Vaccines- Shingrix (1 of 2) Never done   COVID-19 Vaccine (5 - 2024-25 season) 10/23/2022   Medicare Annual Wellness (AWV)  02/09/2023   MAMMOGRAM  06/20/2023   INFLUENZA VACCINE  09/22/2023   Pneumonia Vaccine 75+ Years old  Completed   DEXA SCAN  Completed   HPV VACCINES  Aged Out   DTaP/Tdap/Td  Discontinued    Health Maintenance  Health Maintenance Due  Topic Date Due   Zoster Vaccines- Shingrix (1 of 2) Never done   COVID-19 Vaccine (5 - 2024-25 season) 10/23/2022   Medicare Annual Wellness (AWV)  02/09/2023   Health Maintenance Items Addressed: Mammogram ordered, DEXA ordered, Discussed the need to update shingles vaccine.  Additional Screening:  Vision Screening: Recommended annual ophthalmology exams for early detection of glaucoma and other disorders of the eye. Up to date [employer], plans on going back to [employer]  Dental Screening: Recommended annual dental exams for proper oral hygiene  Community Resource Referral / Chronic Care Management: CRR required this visit?  No   CCM required this visit?  No     Plan:     I have personally reviewed and noted the following in the patient's  chart:   Medical and social history Use of alcohol, tobacco or illicit drugs  Current medications and supplements including opioid prescriptions. Patient is not currently  taking opioid prescriptions. Functional ability and status Nutritional status Physical activity Advanced directives List of other physicians Hospitalizations, surgeries, and ER visits in previous 12 months Vitals Screenings to include cognitive, depression, and falls Referrals and appointments  In addition, I have reviewed and discussed with patient certain preventive protocols, quality metrics, and best practice recommendations. A written personalized care plan for preventive services as well as general preventive health recommendations were provided to patient.     Sydell Axon, LPN   07/30/6293   After Visit Summary: (MyChart) Due to this being a telephonic visit, the after visit summary with patients personalized plan was offered to patient via MyChart   Notes: Nothing significant to report at this time.

## 2023-05-25 ENCOUNTER — Ambulatory Visit: Payer: Medicare PPO | Admitting: Physician Assistant

## 2023-06-08 NOTE — Progress Notes (Deleted)
 Cardiology Office Note    Date:  06/08/2023   ID:  Monica [name], DOB 1935-11-29, MRN [id number]  PCP:  Thersia Flax, MD  Cardiologist:  Antionette Kirks, MD  Electrophysiologist:  None   Chief Complaint: Follow-up  History of Present Illness:   Monica [name] is a 88 y.o. female with history of coronary artery calcification, atrial flutter, HFpEF, previous embolic CVA, bilateral DVT, HTN, and OSA who presents for follow-up of coronary artery calcification, atrial flutter, and HFpEF.   She was previously followed by Dr. Felipe Horton with transition of care to Dr. Alvenia Aus in 02/2022.  She was admitted in 2020 with a left MCA stroke and treated with catheter-based intervention.  She was found to have bilateral DVT and was anticoagulated with apixaban .  Echo at that time showed an EF greater than 65%, diastolic dysfunction, normal RV systolic function with normal ventricular cavity size, mildly to moderately dilated left atrium, moderate mitral annular calcification, and no intracardiac thrombi noted.  She developed atrial flutter that converted to sinus rhythm with amiodarone  with subsequent discontinuation of antiarrhythmic therapy.  She was noted to be in atrial flutter in 2022 with intermittent tachycardia.  Low-dose digoxin  was added at that time.  She was seen in the office by Dr. Alvenia Aus in 02/2022 and reported chronic exertional dyspnea.  She was in sinus rhythm at that time.  She was last seen in the office in 02/2023 noting some exertional shortness of breath and fatigue.  Her daughter felt like the exertional fatigue became more pronounced following the initiation of digoxin , which was discontinued given that she was maintaining sinus rhythm and in the context of preserved LV systolic function.  Her weight was down 11 pounds when compared to her last clinic visit in our office and she was euvolemic.  Echo in 03/2023 showed an EF of 60 to 65%, no regional wall motion abnormalities, mild LVH, grade  2 diastolic dysfunction, normal RV systolic function and ventricular cavity size, moderately dilated left atrium, no significant valvular abnormalities, and an estimated right atrial pressure of 3 mmHg.  ***   Labs independently reviewed: 02/2023 - TC 119, TG 104, HDL 40, LDL 60, direct LDL 64, BUN 14, serum creatinine 0.95, potassium 4.2, AST/ALT normal, albumin 4.2, Hgb 12.7, PLT 253 06/2022 - A1c 6.3 03/2022 - TSH normal   Past Medical History:  Diagnosis Date   Abnormal gait 09/22/2021   Abnormal weight gain 02/24/2021   Allergic rhinitis    Anxiety    Benign neoplasm of breast 2013   Bilateral shoulder pain 01/13/2020   Cardiac arrhythmia    Cardiomegaly    Cataract    Cervicalgia 01/13/2020   CHF (congestive heart failure) (HCC)    Chronic back pain 12/26/2018   Severe stenosis L spine       Chronic cough 04/15/2016   Chronic knee pain 07/22/2020   Chronic left shoulder pain 06/07/2019   Colon cancer screening 02/24/2021   Compulsive eating patterns    Compulsive overeating    Compulsive overeating 01/01/2015   Degenerative lumbar spinal stenosis 09/20/2017   Depression    Depression with anxiety 12/21/2006   Diastolic heart failure (HCC)    LVH with free wall thickness 1.4 cm EF 60% Dr. Felipe Horton    Diffuse cystic mastopathy 2013   Diverticulosis of large intestine without perforation or abscess without bleeding    Dysuria 06/03/2020   Edema    Enlarged clavicle 12/29/2021   Epistaxis  noted 12/20/18    Epistaxis 01/07/2019   Essential hypertension 12/21/2006   Generalized abdominal pain 01/03/2019   GI bleeding    in ~2018    Gout    Grief 01/13/2020   Hiatal hernia    HTN (hypertension)    Hyperlipidemia    Intertrigo 10/01/2020   Iron  deficiency anemia 01/03/2019   Kidney cysts 01/13/2020   Leg DVT (deep venous thromboembolism), acute, bilateral (HCC) 12/26/2018   Noted US  legs 09/02/18 b/l       Lower GI bleed 10/03/2016   Lump or mass in breast 2012    Memory loss 03/06/2019   Middle cerebral artery embolism, 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   OA (osteoarthritis) of knee    with injections   Obesity    OSA (obstructive sleep apnea)    not on cpap   Other abnormal glucose    Partial nontraumatic rupture of right rotator cuff 09/22/2015   Pelvic floor dysfunction 06/06/2019   Peptic stricture of esophagus    Personal history of tobacco use, presenting hazards to health    Rectal bleeding 02/24/2021   Recurrent falls 01/13/2020   Sciatica    Seborrheic keratoses 04/15/2016   Sinus infection 2010   Spinal stenosis    lumbar    Status post bilateral knee replacements 05/04/2016   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   09/01/2018   Established GNA with thrombectomy intracranial in hospital    Stroke [facility])    Unspecified sleep apnea    Urge incontinence of urine 09/09/2011   Urine frequency 02/17/2021   UTI (urinary tract infection)    e coli 06/2020    Past Surgical History:  Procedure Laterality Date   adenosine cardiolite  1/08   low risk    APPENDECTOMY     BREAST BIOPSY     02/28/11 negative   BREAST LUMPECTOMY     right x1 ('89) left x2 ('70s, '90)   CARDIAC CATHETERIZATION  2001   normal per pt   COLONOSCOPY  11/02   diverticulosis   COLONOSCOPY  9/06   diverticulosis, hemorhoids   COLONOSCOPY N/A 10/01/2016   Procedure: COLONOSCOPY;  Surgeon: Kenney Peacemaker, MD;  Location: [facility];  Service: Endoscopy;  Laterality: N/A;   dexa  3/02   normal   EYE SURGERY  2012   cataract   IR CT HEAD LTD  09/01/2018   IR PERCUTANEOUS ART THROMBECTOMY/INFUSION INTRACRANIAL INC DIAG ANGIO  09/01/2018   JOINT REPLACEMENT     knee b/l 2011   knee replacement Right 9/11   R. Dr. Arthor Billet   RADIOLOGY WITH ANESTHESIA N/A 09/01/2018   Procedure: IR WITH ANESTHESIA;  Surgeon: Luellen Sages, MD;  Location: [facility] OR;  Service: Radiology;  Laterality: N/A;   sleep study  5/05   TONSILLECTOMY     TOTAL ABDOMINAL HYSTERECTOMY     fibroids, age of  25   TOTAL KNEE ARTHROPLASTY Left 2012   US  TRANSVAGINAL PELVIC MODIFIED  2000, 2001    Current Medications: No outpatient medications have been marked as taking for the 06/09/23 encounter (Appointment) with Roark Chick, PA-C.    Allergies:   Lisinopril   Social History   Socioeconomic History   Marital status: Widowed    Spouse name: Not on file   Number of children: 1   Years of education: Not on file   Highest education level: Not on file  Occupational History   Occupation: Retired school principal    Employer: RETIRED  Tobacco Use  Smoking status: Former    Current packs/day: 0.50    Average packs/day: 0.5 packs/day for 20.0 years (10.0 ttl pk-yrs)    Types: Cigarettes   Smokeless tobacco: Never   Tobacco comments:    quit approx. 20 years ago  Vaping Use   Vaping status: Never Used  Substance and Sexual Activity   Alcohol use: Yes    Alcohol/week: 0.0 standard drinks of alcohol    Comment: wine occasional   Drug use: No   Sexual activity: Never  Other Topics Concern   Not on file  Social History Narrative   Widowed x 30+ years as of 12/2018    1 daughter. Retired principal. Has had to take care of sick family members    Masters degree retired Lawyer    Social Drivers of Corporate investment banker Strain: Low Risk  (05/23/2023)   Overall Financial Resource Strain (CARDIA)    Difficulty of Paying Living Expenses: Not hard at all  Food Insecurity: No Food Insecurity (05/23/2023)   Hunger Vital Sign    Worried About Running Out of Food in the Last Year: Never true    Ran Out of Food in the Last Year: Never true  Transportation Needs: No Transportation Needs (05/23/2023)   PRAPARE - Administrator, Civil Service (Medical): No    Lack of Transportation (Non-Medical): No  Physical Activity: Inactive (05/23/2023)   Exercise Vital Sign    Days of Exercise per Week: 0 days    Minutes of Exercise per Session: 0 min  Stress: Stress Concern Present  (05/23/2023)   [employer] of Occupational Health - Occupational Stress Questionnaire    Feeling of Stress : Very much  Social Connections: Socially Isolated (05/23/2023)   Social Connection and Isolation Panel [NHANES]    Frequency of Communication with Friends and Family: Twice a week    Frequency of Social Gatherings with Friends and Family: Twice a week    Attends Religious Services: Never    Database administrator or Organizations: No    Attends Banker Meetings: Never    Marital Status: Widowed     Family History:  The patient's family history includes Breast cancer in her sister; Cancer in her brother and sister; Drug abuse in her brother; Gout in an other family member; Heart failure in her mother; Hepatitis in her brother; Hypertension in her mother; Other in her brother; Prostate cancer in her brother; Stroke in her father.  ROS:   12-point review of systems is negative unless otherwise noted in the HPI.   EKGs/Labs/Other Studies Reviewed:    Studies reviewed were summarized above. The additional studies were reviewed today:  2D echo 04/13/2023: 1. Left ventricular ejection fraction, by estimation, is 60 to 65%. The  left ventricle has normal function. The left ventricle has no regional  wall motion abnormalities. There is mild left ventricular hypertrophy.  Left ventricular diastolic parameters  are consistent with Grade II diastolic dysfunction (pseudonormalization).   2. Right ventricular systolic function is normal. The right ventricular  size is normal. Tricuspid regurgitation signal is inadequate for assessing  PA pressure.   3. Left atrial size was moderately dilated.   4. The mitral valve is normal in structure. No evidence of mitral valve  regurgitation. No evidence of mitral stenosis.   5. The aortic valve is normal in structure. Aortic valve regurgitation is  not visualized. No aortic stenosis is present.   6. The inferior vena cava is  normal  in size with greater than 50%  respiratory variability, suggesting right atrial pressure of 3 mmHg.  __________  Zio patch 05/2020: Atrial Fibrillation occurred continuously (100% burden), ranging from 59-135 bpm (avg of 78 bpm). Isolated VEs were rare (<1.0%), VE Couplets were rare (<1.0%), and no VE Triplets were present.  __________   Outpatient cardiac monitor 12/2018: Normal sinus rhythm and sinus bradycardia. No atrial fibrillation or flutter. __________   2D echo 09/02/2018: 1. The left ventricle has hyperdynamic systolic function, with an  ejection fraction of >65%. The cavity size was normal. There is mildly  increased left ventricular wall thickness. Left ventricular diastolic  Doppler parameters are consistent with  pseudonormalization.   2. The right ventricle has normal systolic function. The cavity was  normal. There is no increase in right ventricular wall thickness. Right  ventricular systolic pressure could not be assessed.   3. Left atrial size was mild-moderately dilated.   4. The mitral valve is abnormal. There is moderate mitral annular  calcification present.   5. No stenosis of the aortic valve.   6. Likely calcified, prominent Coumadin ridge (normal variant landmark  between LA appendage and left upper pulmonary vein). There is a 0.8 x 1.4  cm calcified mass like lesion adherent to the lateral LA wall, in the  location of the Coumadin ridge (left  lateral ridge). It is non mobile, and likely represents calcified normal  variant structure.   7. No intracardiac thrombi or masses were visualized.    EKG:  EKG is ordered today.  The EKG ordered today demonstrates ***  Recent Labs: 03/24/2023: ALT 15; BUN 14; Creatinine, Ser 0.95; Hemoglobin 12.7; Platelets 253; Potassium 4.2; Sodium 146  Recent Lipid Panel    Component Value Date/Time   CHOL 119 03/24/2023 1519   TRIG 104 03/24/2023 1519   HDL 40 03/24/2023 1519   CHOLHDL 3.0 03/24/2023 1519   CHOLHDL 3  07/19/2022 1534   VLDL 32.6 07/19/2022 1534   LDLCALC 60 03/24/2023 1519   LDLDIRECT 64 03/24/2023 1519   LDLDIRECT 66.0 07/19/2022 1534    PHYSICAL EXAM:    VS:  There were no vitals taken for this visit.  BMI: There is no height or weight on file to calculate BMI.  Physical Exam  Wt Readings from Last 3 Encounters:  05/23/23 233 lb (105.7 kg)  03/24/23 229 lb (103.9 kg)  01/02/23 236 lb 9.6 oz (107.3 kg)     ASSESSMENT & PLAN:   Paroxysmal atrial flutter: ***.  CHA2DS2-VASc at least 8 (CHF, HTN, age x 2, stroke x 2, vascular disease).  Coronary artery calcification/HLD: ***.  Direct LDL 64 in 02/2023.  HFpEF/dyspnea:  HTN: Blood pressure  History of bilateral DVTs:   {Are you ordering a CV Procedure (e.g. stress test, cath, DCCV, TEE, etc)?   Press F2        :[id number]}     Disposition: F/u with Dr. Alvenia Aus or an APP in ***.   Medication Adjustments/Labs and Tests Ordered: Current medicines are reviewed at length with the patient today.  Concerns regarding medicines are outlined above. Medication changes, Labs and Tests ordered today are summarized above and listed in the Patient Instructions accessible in Encounters.   Signed, Varney Gentleman, PA-C 06/08/2023 1:39 PM     [facility] [place] [address] [place], [place] [address] [phone]

## 2023-06-09 ENCOUNTER — Ambulatory Visit: Attending: Physician Assistant | Admitting: Physician Assistant

## 2023-06-12 ENCOUNTER — Ambulatory Visit: Payer: Self-pay

## 2023-06-12 NOTE — Telephone Encounter (Signed)
 Spoke to pt she stated that she does NOT want to go to [facility], she will speak to her Daughter and see what they are able to come up with and they will reach back out to our office

## 2023-06-12 NOTE — Telephone Encounter (Signed)
 Chief Complaint: Shoulder pain Symptoms: Chronic shoulder pain Frequency: Years Pertinent Negatives: Patient denies any new or changing symptoms Disposition: [] ED /[] Urgent Care (no appt availability in office) / [x] Appointment(In office/virtual)/ []  [place] [facility]/ [] Home Care/ [] Refused Recommended Disposition /[] [facility]/ []  Follow-up with PCP Additional Notes: Patient's daughter called in asking if patient could get an injection in her shoulder for her chronic shoulder pain. Patient's daughter states that patient's son-in-law received an injection by his provider and had significant pain relief. Patient's daughter did not want to answer assessment questions and states patient has upcoming appt with a shoulder specialist, but will address her concerns with PCP at upcoming appt.   Copied from CRM [id number]. Topic: Clinical - Red Word Triage >> Jun 12, 2023  2:42 PM Albertha Alosa wrote: [facility] that prompted transfer to Nurse Triage: Patient daughter called in stating patient is experiencing extreme pain in her shoulder , wanted to see if she can get a cortisone shot by Dr.Copland Reason for Disposition  Shoulder pain is a chronic symptom (recurrent or ongoing AND present > 4 weeks)  Answer Assessment - Initial Assessment Questions 1. ONSET: "When did the pain start?"     Years 2. LOCATION: "Where is the pain located?"     Right shoulder 3. PAIN: "How bad is the pain?" (Scale 1-10; or mild, moderate, severe)   - MILD (1-3): doesn't interfere with normal activities   - MODERATE (4-7): interferes with normal activities (e.g., work or school) or awakens from sleep   - SEVERE (8-10): excruciating pain, unable to do any normal activities, unable to move arm at all due to pain     N/a 4. WORK OR EXERCISE: "Has there been any recent work or exercise that involved this part of the body?"     No 5. CAUSE: "What do you think is causing the shoulder pain?"     Chronic  6. OTHER  SYMPTOMS: "Do you have any other symptoms?" (e.g., neck pain, swelling, rash, fever, numbness, weakness)     N/a  Protocols used: Shoulder Pain-A-AH

## 2023-06-15 ENCOUNTER — Other Ambulatory Visit: Payer: Self-pay | Admitting: Internal Medicine

## 2023-06-15 DIAGNOSIS — I10 Essential (primary) hypertension: Secondary | ICD-10-CM

## 2023-06-20 ENCOUNTER — Encounter: Payer: Self-pay | Admitting: Internal Medicine

## 2023-06-20 ENCOUNTER — Ambulatory Visit: Admitting: Internal Medicine

## 2023-06-20 VITALS — BP 130/74 | HR 73 | Ht 67.0 in | Wt 233.8 lb

## 2023-06-20 DIAGNOSIS — G8929 Other chronic pain: Secondary | ICD-10-CM

## 2023-06-20 DIAGNOSIS — M25511 Pain in right shoulder: Secondary | ICD-10-CM

## 2023-06-20 DIAGNOSIS — I110 Hypertensive heart disease with heart failure: Secondary | ICD-10-CM

## 2023-06-20 DIAGNOSIS — R7303 Prediabetes: Secondary | ICD-10-CM | POA: Diagnosis not present

## 2023-06-20 DIAGNOSIS — L304 Erythema intertrigo: Secondary | ICD-10-CM

## 2023-06-20 DIAGNOSIS — I10 Essential (primary) hypertension: Secondary | ICD-10-CM

## 2023-06-20 DIAGNOSIS — B372 Candidiasis of skin and nail: Secondary | ICD-10-CM

## 2023-06-20 DIAGNOSIS — F419 Anxiety disorder, unspecified: Secondary | ICD-10-CM

## 2023-06-20 DIAGNOSIS — N3281 Overactive bladder: Secondary | ICD-10-CM

## 2023-06-20 DIAGNOSIS — M79671 Pain in right foot: Secondary | ICD-10-CM

## 2023-06-20 DIAGNOSIS — E7849 Other hyperlipidemia: Secondary | ICD-10-CM

## 2023-06-20 DIAGNOSIS — I714 Abdominal aortic aneurysm, without rupture, unspecified: Secondary | ICD-10-CM

## 2023-06-20 DIAGNOSIS — D6869 Other thrombophilia: Secondary | ICD-10-CM

## 2023-06-20 MED ORDER — ALPRAZOLAM 0.25 MG PO TABS: 0.2500 mg | ORAL_TABLET | Freq: Every day | ORAL | 2 refills | Status: AC | PRN

## 2023-06-20 MED ORDER — DULOXETINE HCL 40 MG PO CPEP: 40.0000 mg | ORAL_CAPSULE | Freq: Every day | ORAL | 1 refills | Status: DC

## 2023-06-20 MED ORDER — TRIAMCINOLONE ACETONIDE 0.1 % EX CREA: 1.0000 | TOPICAL_CREAM | Freq: Two times a day (BID) | CUTANEOUS | 0 refills | Status: DC

## 2023-06-20 MED ORDER — DULOXETINE HCL 30 MG PO CPEP: 30.0000 mg | ORAL_CAPSULE | Freq: Every day | ORAL | 1 refills | Status: DC

## 2023-06-20 MED ORDER — AMLODIPINE BESYLATE 5 MG PO TABS: 5.0000 mg | ORAL_TABLET | Freq: Every day | ORAL | 1 refills | Status: DC

## 2023-06-20 MED ORDER — NYSTATIN 100000 UNIT/GM EX CREA: 1.0000 | TOPICAL_CREAM | Freq: Two times a day (BID) | CUTANEOUS | 11 refills | Status: AC

## 2023-06-20 MED ORDER — APIXABAN 5 MG PO TABS: 5.0000 mg | ORAL_TABLET | Freq: Two times a day (BID) | ORAL | 2 refills | Status: AC

## 2023-06-20 NOTE — Progress Notes (Unsigned)
 Subjective:  Patient ID: Monica [name], female    DOB: 1936-02-12  Age: 88 y.o. MRN: [id number]  CC: The primary encounter diagnosis was Other hyperlipidemia. Diagnoses of Hypertension, unspecified type, Other chronic pain, Prediabetes, and Intertrigo were also pertinent to this visit.   HPI Monica [name] presents for  Chief Complaint  Patient presents with   Medical Management of Chronic Issues   1) FOOT PAIN :  right foot.  Underlying neuropathy, stopped taking gabapentin  because of concerns THAT IT CAUSES DEMENTIA . She does not recall if her pain has worsened since stopping it a few months ago.    2) URINARY INCONTINENCE/OVERACTIVE BLADDER>  REVIEWED UROLOGY NOTE  DR MCDIARMID HAD CONSIDERABLE DIFFICULTY IN GETTING AN ACCURATE HISTORY OF PAST DRUG TRIALS DUE TO PATIENT'S DIFFICULTY WITH HISTORY ,  BUT DID NOTE THAT RECURRENT INFECTIONS STOPPED WHILE SHE WAS TAKING PREVENTIVE DOSES OF SEPTRA   3) CANDIDIASIS OF SKIN FOLDS  4) right shoulder pain: received an injection   5) skin itching  in the folds  6) seborrheic keratoses on her trunk     Outpatient Medications Prior to Visit  Medication Sig Dispense Refill   allopurinol  (ZYLOPRIM ) 100 MG tablet TAKE 2 TABLETS BY MOUTH EVERY DAY 180 tablet 3   ALPRAZolam  (XANAX ) 0.25 MG tablet Take 1 tablet (0.25 mg total) by mouth 2 (two) times daily as needed for anxiety. 60 tablet 5   amLODipine  (NORVASC ) 5 MG tablet Take 1 tablet (5 mg total) by mouth daily. Take additional 5 mg in pm if BP>130/>80 180 tablet 3   atorvastatin  (LIPITOR) 20 MG tablet TAKE 1 TABLET BY MOUTH EVERYDAY AT BEDTIME 90 tablet 3   DULoxetine  (CYMBALTA ) 30 MG capsule TAKE 1 CAPSULE BY MOUTH EVERY DAY 90 capsule 0   ELIQUIS  5 MG TABS tablet TAKE 1 TABLET BY MOUTH TWICE A DAY 180 tablet 2   furosemide  (LASIX ) 40 MG tablet Take 1 tablet (40 mg total) by mouth daily. 90 tablet 3   hydrocortisone  2.5 % cream Apply topically 2 (two) times daily. 30 g 2    magnesium oxide (MAG-OX) 400 (240 Mg) MG tablet Take 400 mg by mouth daily.     metoprolol  succinate (TOPROL -XL) 100 MG 24 hr tablet TAKE WITH OR IMMEDIATELY FOLLOWING A MEAL. 90 tablet 3   nystatin  cream (MYCOSTATIN ) Apply 1 Application topically 3 (three) times daily. Prn right back 30 g 11   oxybutynin  (DITROPAN -XL) 10 MG 24 hr tablet Take 1 tablet (10 mg total) by mouth daily. 30 tablet 11   potassium chloride  SA (KLOR-CON  M) 20 MEQ tablet TAKE 1 TABLET (20 MEQ TOTAL) BY MOUTH DAILY. OVERDUE FOLLOW UP VISIT. PLEASE CALL OFFICE TO SCHEDULE APPOINTMENT PRIOR TO NEXT REFILL (FIRST ATTEMPT) 90 tablet 1   triamcinolone  cream (KENALOG ) 0.1 % APPLY 1 APPLICATION TOPICALLY 2 (TWO) TIMES DAILY. TO PATCHY RASH 30 g 0   gabapentin  (NEURONTIN ) 300 MG capsule Take 1 tablet in the morning and 2 tablets at nightTake 1 tablet in the morning and 2 tablets at night (Patient not taking: Reported on 06/20/2023) 90 capsule 5   Vibegron  (GEMTESA ) 75 MG TABS Take 1 tablet (75 mg total) by mouth daily. (Patient not taking: Reported on 06/20/2023)     No facility-administered medications prior to visit.    Review of Systems;  Patient denies headache, fevers, malaise, unintentional weight loss, skin rash, eye pain, sinus congestion and sinus pain, sore throat, dysphagia,  hemoptysis , cough, dyspnea, wheezing, chest pain,  palpitations, orthopnea, edema, abdominal pain, nausea, melena, diarrhea, constipation, flank pain, dysuria, hematuria, urinary  Frequency, nocturia, numbness, tingling, seizures,  Focal weakness, Loss of consciousness,  Tremor, insomnia, depression, anxiety, and suicidal ideation.      Objective:  BP 130/74   Pulse 73   Ht 5\' 7"  (1.702 m)   Wt 233 lb 12.8 oz (106.1 kg)   SpO2 98%   BMI 36.62 kg/m   BP Readings from Last 3 Encounters:  06/20/23 130/74  05/23/23 130/84  03/24/23 130/77    Wt Readings from Last 3 Encounters:  06/20/23 233 lb 12.8 oz (106.1 kg)  05/23/23 233 lb (105.7 kg)   03/24/23 229 lb (103.9 kg)    Physical Exam  Lab Results  Component Value Date   HGBA1C 6.3 07/19/2022   HGBA1C 6.4 04/19/2022   HGBA1C 6.0 (A) 12/29/2021    Lab Results  Component Value Date   CREATININE 0.95 03/24/2023   CREATININE 0.91 07/19/2022   CREATININE 0.92 12/29/2021    Lab Results  Component Value Date   WBC 7.0 03/24/2023   HGB 12.7 03/24/2023   HCT 39.6 03/24/2023   PLT 253 03/24/2023   GLUCOSE 87 03/24/2023   CHOL 119 03/24/2023   TRIG 104 03/24/2023   HDL 40 03/24/2023   LDLDIRECT 64 03/24/2023   LDLCALC 60 03/24/2023   ALT 15 03/24/2023   AST 17 03/24/2023   NA 146 (H) 03/24/2023   K 4.2 03/24/2023   CL 105 03/24/2023   CREATININE 0.95 03/24/2023   BUN 14 03/24/2023   CO2 26 03/24/2023   TSH 2.19 04/19/2022   INR 1.0 09/01/2018   HGBA1C 6.3 07/19/2022   MICROALBUR 1.6 04/19/2022    DG Shoulder Right Result Date: 12/24/2022 CLINICAL DATA:  Shoulder pain for months. Fall today. On blood thinners. EXAM: RIGHT SHOULDER - 2+ VIEW; LEFT SHOULDER - 2+ VIEW COMPARISON:  Radiographs of the left shoulder 06/06/2019. Left clavicle radiographs 12/29/2021. FINDINGS: There is no evidence of acute fracture or dislocation. Moderate to severe right and mild left glenohumeral degenerative changes are present. There is prominent subchondral cyst formation within the right humeral head and glenoid. There are mild acromioclavicular degenerative changes bilaterally. Aortic atherosclerosis and thoracic spine paraspinal osteophytes noted. IMPRESSION: 1. No evidence of acute fracture or dislocation. 2. Moderate to severe right and mild left glenohumeral degenerative changes. Electronically Signed   By: Elmon Hagedorn M.D.   On: 12/24/2022 14:04   DG Shoulder Left Result Date: 12/24/2022 CLINICAL DATA:  Shoulder pain for months. Fall today. On blood thinners. EXAM: RIGHT SHOULDER - 2+ VIEW; LEFT SHOULDER - 2+ VIEW COMPARISON:  Radiographs of the left shoulder 06/06/2019.  Left clavicle radiographs 12/29/2021. FINDINGS: There is no evidence of acute fracture or dislocation. Moderate to severe right and mild left glenohumeral degenerative changes are present. There is prominent subchondral cyst formation within the right humeral head and glenoid. There are mild acromioclavicular degenerative changes bilaterally. Aortic atherosclerosis and thoracic spine paraspinal osteophytes noted. IMPRESSION: 1. No evidence of acute fracture or dislocation. 2. Moderate to severe right and mild left glenohumeral degenerative changes. Electronically Signed   By: Elmon Hagedorn M.D.   On: 12/24/2022 14:04   CT Head Wo Contrast Result Date: 12/24/2022 CLINICAL DATA:  Head trauma, minor (Age >= 65y); Facial trauma, blunt; Neck trauma (Age >= 65y) EXAM: CT HEAD WITHOUT CONTRAST CT MAXILLOFACIAL WITHOUT CONTRAST CT CERVICAL SPINE WITHOUT CONTRAST TECHNIQUE: Multidetector CT imaging of the head, cervical spine, and maxillofacial structures were  performed using the standard protocol without intravenous contrast. Multiplanar CT image reconstructions of the cervical spine and maxillofacial structures were also generated. RADIATION DOSE REDUCTION: This exam was performed according to the departmental dose-optimization program which includes automated exposure control, adjustment of the mA and/or kV according to patient size and/or use of iterative reconstruction technique. COMPARISON:  09/01/2018, 06/24/2022 FINDINGS: CT HEAD FINDINGS Brain: No evidence of acute infarction, hemorrhage, hydrocephalus, extra-axial collection or mass lesion/mass effect. Scattered low-density changes within the periventricular and subcortical white matter most compatible with chronic microvascular ischemic change. Mild diffuse cerebral volume loss. Vascular: Atherosclerotic calcifications involving the large vessels of the skull base. No unexpected hyperdense vessel. Skull: Normal. Negative for fracture or focal lesion. Other:  Soft tissue swelling of the left parietal scalp. CT MAXILLOFACIAL FINDINGS Osseous: No acute maxillofacial bone fracture. Bony orbital walls are intact. Mandible intact. Temporomandibular joints are aligned without dislocation. Orbits: Negative. No traumatic or inflammatory finding. Sinuses: Clear. Soft tissues: No soft tissue hematoma. Stable ovoid hyperattenuating structure within the superior right parotid gland measuring 10 x 6 mm, probably representing an intraparotid lymph node. CT CERVICAL SPINE FINDINGS Alignment: Facet joints are aligned without dislocation or traumatic listhesis. Dens and lateral masses are aligned. Reversal of the cervical lordosis. Grade 1 anterolisthesis at C2-3, C3-4, and C4-5. Skull base and vertebrae: No acute fracture. No primary bone lesion or focal pathologic process. Soft tissues and spinal canal: No prevertebral fluid or swelling. No visible canal hematoma. Medial course of the common carotid arteries. Disc levels: Degenerative disc disease is most pronounced at C5-6 disc height loss and bulky anterior endplate osteophytes. Upper chest: Included lung apices are clear. Other: None. IMPRESSION: 1. No acute intracranial abnormality. 2. Soft tissue swelling of the left parietal scalp. No underlying calvarial fracture. 3. No acute maxillofacial bone fracture. 4. No acute fracture or subluxation of the cervical spine. Electronically Signed   By: Leverne Reading D.O.   On: 12/24/2022 13:43   CT Cervical Spine Wo Contrast Result Date: 12/24/2022 CLINICAL DATA:  Head trauma, minor (Age >= 65y); Facial trauma, blunt; Neck trauma (Age >= 65y) EXAM: CT HEAD WITHOUT CONTRAST CT MAXILLOFACIAL WITHOUT CONTRAST CT CERVICAL SPINE WITHOUT CONTRAST TECHNIQUE: Multidetector CT imaging of the head, cervical spine, and maxillofacial structures were performed using the standard protocol without intravenous contrast. Multiplanar CT image reconstructions of the cervical spine and maxillofacial  structures were also generated. RADIATION DOSE REDUCTION: This exam was performed according to the departmental dose-optimization program which includes automated exposure control, adjustment of the mA and/or kV according to patient size and/or use of iterative reconstruction technique. COMPARISON:  09/01/2018, 06/24/2022 FINDINGS: CT HEAD FINDINGS Brain: No evidence of acute infarction, hemorrhage, hydrocephalus, extra-axial collection or mass lesion/mass effect. Scattered low-density changes within the periventricular and subcortical white matter most compatible with chronic microvascular ischemic change. Mild diffuse cerebral volume loss. Vascular: Atherosclerotic calcifications involving the large vessels of the skull base. No unexpected hyperdense vessel. Skull: Normal. Negative for fracture or focal lesion. Other: Soft tissue swelling of the left parietal scalp. CT MAXILLOFACIAL FINDINGS Osseous: No acute maxillofacial bone fracture. Bony orbital walls are intact. Mandible intact. Temporomandibular joints are aligned without dislocation. Orbits: Negative. No traumatic or inflammatory finding. Sinuses: Clear. Soft tissues: No soft tissue hematoma. Stable ovoid hyperattenuating structure within the superior right parotid gland measuring 10 x 6 mm, probably representing an intraparotid lymph node. CT CERVICAL SPINE FINDINGS Alignment: Facet joints are aligned without dislocation or traumatic listhesis. Dens and lateral masses are  aligned. Reversal of the cervical lordosis. Grade 1 anterolisthesis at C2-3, C3-4, and C4-5. Skull base and vertebrae: No acute fracture. No primary bone lesion or focal pathologic process. Soft tissues and spinal canal: No prevertebral fluid or swelling. No visible canal hematoma. Medial course of the common carotid arteries. Disc levels: Degenerative disc disease is most pronounced at C5-6 disc height loss and bulky anterior endplate osteophytes. Upper chest: Included lung apices are  clear. Other: None. IMPRESSION: 1. No acute intracranial abnormality. 2. Soft tissue swelling of the left parietal scalp. No underlying calvarial fracture. 3. No acute maxillofacial bone fracture. 4. No acute fracture or subluxation of the cervical spine. Electronically Signed   By: Leverne Reading D.O.   On: 12/24/2022 13:43   CT Maxillofacial WO CM Result Date: 12/24/2022 CLINICAL DATA:  Head trauma, minor (Age >= 65y); Facial trauma, blunt; Neck trauma (Age >= 65y) EXAM: CT HEAD WITHOUT CONTRAST CT MAXILLOFACIAL WITHOUT CONTRAST CT CERVICAL SPINE WITHOUT CONTRAST TECHNIQUE: Multidetector CT imaging of the head, cervical spine, and maxillofacial structures were performed using the standard protocol without intravenous contrast. Multiplanar CT image reconstructions of the cervical spine and maxillofacial structures were also generated. RADIATION DOSE REDUCTION: This exam was performed according to the departmental dose-optimization program which includes automated exposure control, adjustment of the mA and/or kV according to patient size and/or use of iterative reconstruction technique. COMPARISON:  09/01/2018, 06/24/2022 FINDINGS: CT HEAD FINDINGS Brain: No evidence of acute infarction, hemorrhage, hydrocephalus, extra-axial collection or mass lesion/mass effect. Scattered low-density changes within the periventricular and subcortical white matter most compatible with chronic microvascular ischemic change. Mild diffuse cerebral volume loss. Vascular: Atherosclerotic calcifications involving the large vessels of the skull base. No unexpected hyperdense vessel. Skull: Normal. Negative for fracture or focal lesion. Other: Soft tissue swelling of the left parietal scalp. CT MAXILLOFACIAL FINDINGS Osseous: No acute maxillofacial bone fracture. Bony orbital walls are intact. Mandible intact. Temporomandibular joints are aligned without dislocation. Orbits: Negative. No traumatic or inflammatory finding. Sinuses:  Clear. Soft tissues: No soft tissue hematoma. Stable ovoid hyperattenuating structure within the superior right parotid gland measuring 10 x 6 mm, probably representing an intraparotid lymph node. CT CERVICAL SPINE FINDINGS Alignment: Facet joints are aligned without dislocation or traumatic listhesis. Dens and lateral masses are aligned. Reversal of the cervical lordosis. Grade 1 anterolisthesis at C2-3, C3-4, and C4-5. Skull base and vertebrae: No acute fracture. No primary bone lesion or focal pathologic process. Soft tissues and spinal canal: No prevertebral fluid or swelling. No visible canal hematoma. Medial course of the common carotid arteries. Disc levels: Degenerative disc disease is most pronounced at C5-6 disc height loss and bulky anterior endplate osteophytes. Upper chest: Included lung apices are clear. Other: None. IMPRESSION: 1. No acute intracranial abnormality. 2. Soft tissue swelling of the left parietal scalp. No underlying calvarial fracture. 3. No acute maxillofacial bone fracture. 4. No acute fracture or subluxation of the cervical spine. Electronically Signed   By: Leverne Reading D.O.   On: 12/24/2022 13:43    Assessment & Plan:  .Other hyperlipidemia  Hypertension, unspecified type  Other chronic pain  Prediabetes  Intertrigo     I spent 34 minutes on the day of this face to face encounter reviewing patient's  most recent visit with cardiology,  nephrology,  and neurology,  prior relevant surgical and non surgical procedures, recent  labs and imaging studies, counseling on weight management,  reviewing the assessment and plan with patient, and post visit ordering and reviewing of  diagnostics and therapeutics with patient  .   Follow-up: No follow-ups on file.   Thersia Flax, MD

## 2023-06-20 NOTE — Patient Instructions (Addendum)
 Cerave, cetaphil ,  eucerin,  or aveeno  are the best moisturizera to use on the patches and the legs   You need  32 to 48 ounces of water daily to keep skin moist FROM THE INSIDE   REFERRAL TO PODIATRY    We have increased your anxiety medication (cymbalta ) and refilled your alprazolam  to use NOT MORE THAN ONCE DAILY.  USE THE NYSTATIN  AND THE TRIAMCINOLONE  IN THE SKIN FOLDS,  NOT ON THE PATCHES

## 2023-06-21 LAB — COMPREHENSIVE METABOLIC PANEL WITH GFR
ALT: 13 U/L (ref 0–35)
AST: 17 U/L (ref 0–37)
Albumin: 4.2 g/dL (ref 3.5–5.2)
Alkaline Phosphatase: 108 U/L (ref 39–117)
BUN: 21 mg/dL (ref 6–23)
CO2: 26 meq/L (ref 19–32)
Calcium: 9.6 mg/dL (ref 8.4–10.5)
Chloride: 105 meq/L (ref 96–112)
Creatinine, Ser: 1.08 mg/dL (ref 0.40–1.20)
GFR: 46.74 mL/min — ABNORMAL LOW (ref >60.00)
Glucose, Bld: 89 mg/dL (ref 70–99)
Potassium: 4.3 meq/L (ref 3.5–5.1)
Sodium: 140 meq/L (ref 135–145)
Total Bilirubin: 0.8 mg/dL (ref 0.2–1.2)
Total Protein: 7.3 g/dL (ref 6.0–8.3)

## 2023-06-21 LAB — MICROALBUMIN / CREATININE URINE RATIO
Creatinine,U: 34.2 mg/dL
Microalb Creat Ratio: UNDETERMINED mg/g (ref 0.0–30.0)
Microalb, Ur: <0.7 mg/dL

## 2023-06-21 LAB — HEMOGLOBIN A1C: Hgb A1c MFr Bld: 6.3 % (ref 4.6–6.5)

## 2023-06-21 NOTE — Assessment & Plan Note (Addendum)
 Noted on CT in 2021.  mild aneurysmal dilatation of the distal abdominal aorta. Recommend followup by ultrasound in 3 years and continued aggressive BP control

## 2023-06-21 NOTE — Assessment & Plan Note (Signed)
 Body mass index is 36.62 kg/m.  She is disinclined to exercise   due to multiple orthopedic complaints. . Recommend following a a low glycemic index diet a

## 2023-06-21 NOTE — Assessment & Plan Note (Signed)
 Complicated by frequent UTI.  Continue daily preventive dose of septra . Unclear with medication for OAB is working best for her

## 2023-06-21 NOTE — Assessment & Plan Note (Signed)
 Exam is consistent  with osteophytes at the top of the humerus. . She was referred to Dr Daun Epstein at [facility]

## 2023-06-21 NOTE — Assessment & Plan Note (Signed)
 Recurrent,  refilling Nystatin  cream for skin folds.

## 2023-06-21 NOTE — Assessment & Plan Note (Signed)
secondary to atrial fibrillation.  She is tolerating use of Eliquis for embolic stroke risk mitigation due to  atrial fibrillation. Patient has no signs of bleeding and is advised to notify her specialists prior to any procedure that may required suspension of Eliquis  °

## 2023-06-21 NOTE — Assessment & Plan Note (Signed)
 Her BP is Well controlled on current regimen. Renal function is stable, no changes today.   Lab Results  Component Value Date   CREATININE 1.08 06/20/2023   Lab Results  Component Value Date   NA 140 06/20/2023   K 4.3 06/20/2023   CL 105 06/20/2023   CO2 26 06/20/2023

## 2023-06-22 ENCOUNTER — Encounter: Payer: Self-pay | Admitting: Internal Medicine

## 2023-06-23 ENCOUNTER — Telehealth: Payer: Self-pay

## 2023-06-23 DIAGNOSIS — R944 Abnormal results of kidney function studies: Secondary | ICD-10-CM

## 2023-06-23 NOTE — Telephone Encounter (Signed)
 Lab order has been placed per lab result note.

## 2023-06-26 ENCOUNTER — Ambulatory Visit: Admitting: Internal Medicine

## 2023-06-29 ENCOUNTER — Ambulatory Visit
Admission: RE | Admit: 2023-06-29 | Discharge: 2023-06-29 | Disposition: A | Discharge status: Home / Self Care | Source: Ambulatory Visit | Attending: Internal Medicine | Admitting: Internal Medicine

## 2023-06-29 DIAGNOSIS — Z780 Asymptomatic menopausal state: Secondary | ICD-10-CM | POA: Diagnosis present

## 2023-06-29 DIAGNOSIS — Z1231 Encounter for screening mammogram for malignant neoplasm of breast: Secondary | ICD-10-CM | POA: Insufficient documentation

## 2023-06-30 ENCOUNTER — Ambulatory Visit

## 2023-07-05 ENCOUNTER — Ambulatory Visit: Payer: Self-pay | Admitting: Internal Medicine

## 2023-07-20 ENCOUNTER — Ambulatory Visit: Admitting: Internal Medicine

## 2023-07-21 ENCOUNTER — Other Ambulatory Visit (INDEPENDENT_AMBULATORY_CARE_PROVIDER_SITE_OTHER)

## 2023-07-21 DIAGNOSIS — R944 Abnormal results of kidney function studies: Secondary | ICD-10-CM | POA: Diagnosis not present

## 2023-07-21 NOTE — Addendum Note (Signed)
 Addended by: Thressa Flora D on: 07/21/2023 01:51 PM   Modules accepted: Orders

## 2023-07-22 LAB — COMPREHENSIVE METABOLIC PANEL WITH GFR
ALT: 20 IU/L (ref 0–32)
AST: 19 IU/L (ref 0–40)
Albumin: 4.1 g/dL (ref 3.7–4.7)
Alkaline Phosphatase: 109 IU/L (ref 44–121)
BUN/Creatinine Ratio: 21 (ref 12–28)
BUN: 30 mg/dL — ABNORMAL HIGH (ref 8–27)
Bilirubin Total: 0.5 mg/dL (ref 0.0–1.2)
CO2: 22 mmol/L (ref 20–29)
Calcium: 9.6 mg/dL (ref 8.7–10.3)
Chloride: 105 mmol/L (ref 96–106)
Creatinine, Ser: 1.41 mg/dL — ABNORMAL HIGH (ref 0.57–1.00)
Globulin, Total: 2.4 g/dL (ref 1.5–4.5)
Glucose: 88 mg/dL (ref 70–99)
Potassium: 4.9 mmol/L (ref 3.5–5.2)
Sodium: 143 mmol/L (ref 134–144)
Total Protein: 6.5 g/dL (ref 6.0–8.5)
eGFR: 37 mL/min/{1.73_m2} — ABNORMAL LOW (ref >59)

## 2023-07-23 ENCOUNTER — Ambulatory Visit: Payer: Self-pay | Admitting: Internal Medicine

## 2023-07-24 ENCOUNTER — Ambulatory Visit: Admitting: Urology

## 2023-07-28 ENCOUNTER — Ambulatory Visit: Payer: Self-pay

## 2023-07-28 NOTE — Telephone Encounter (Signed)
 FYI Only or Action Required?: FYI only for provider  Patient was last seen in primary care on 06/20/2023 by Thersia Flax, MD. Called Nurse Triage reporting Arm Pain. Symptoms began several months ago. Interventions attempted: OTC medications: Tylenol  arthritis, pain patches. Symptoms are: right upper arm paingradually worsening.  Triage Disposition: See PCP Within 2 Weeks  Patient/caregiver understands and will follow disposition?: Yes                 Copied from CRM [id number]. Topic: Clinical - Red Word Triage >> Jul 28, 2023  1:40 PM [place] A wrote: [facility] that prompted transfer to Nurse Triage: Patient states that her right arm is hurting for a while Reason for Disposition  [1] MILD pain (e.g., does not interfere with normal activities) AND [2] present > 7 days  Answer Assessment - Initial Assessment Questions 1. ONSET: "When did the pain start?"     Several months. Gradually getting worse.  2. LOCATION: "Where is the pain located?"     Right arm, upper arm (from elbow up)  3. PAIN: "How bad is the pain?" (Scale 1-10; or mild, moderate, severe)   - MILD (1-3): Doesn't interfere with normal activities.   - MODERATE (4-7): Interferes with normal activities (e.g., work or school) or awakens from sleep.   - SEVERE (8-10): Excruciating pain, unable to do any normal activities, unable to hold a cup of water.     Soreness, worse when raising arm or when having to use arm to support herself.  4. WORK OR EXERCISE: "Has there been any recent work or exercise that involved this part of the body?"     No.  5. CAUSE: "What do you think is causing the arm pain?"     She denies any specific injury but states sometimes she leans her weight on her right arm or bumps into things. Patient states her last fall was November 2024 and they told her it is arthritis in her arm.  6. OTHER SYMPTOMS: "Do you have any other symptoms?" (e.g., neck pain, swelling, rash, fever, numbness,  weakness)     Patient denies numbness, weakness in right arm, chest pain, SOB.  7. PREGNANCY: "Is there any chance you are pregnant?" "When was your last menstrual period?"     N/A.  Patient states she has been using OTC pain patches and Tylenol  Arthritis. Patient states a family member sees Dr Geralyn Knee for orthopedics and she would like to see him.  Protocols used: Arm Pain-A-AH

## 2023-08-01 NOTE — Progress Notes (Signed)
 [place] P[place] T. Vincente Asbridge, MD, CAQ Sports Medicine [facility] [address] [place], [address]  Phone: [phone]  FAX: [phone]  Monica [name] - 88 y.o. female  MRN [id number]  Date of Birth: [birth year]  Date: 08/02/2023  PCP: Thersia Flax, MD  Referral: Thersia Flax, MD  Chief Complaint  Patient presents with   Arm Pain    X several months and worsening.    Subjective:   Monica [name] is a 88 y.o. very pleasant female patient with Body mass index is 36.83 kg/m. who presents with the following:  Is a very pleasant patient who I last saw in 2017 and she presents with some ongoing issues with her right arm and shoulder for months where it is been severe, but off and on for years with pain.  She previously saw Dr. Yvonne Hering at [facility] later, and he did do a shoulder injection.  He did an intra-articular injection through the posterior portal at the end of April 2025.  Suggestion was from a surgical standpoint nothing short of arthroplasty would be significantly beneficial per the patient.  He did suggest physical therapy.  November 2024 radiographs are reviewed, and on my interpretation the patient does have moderate glenohumeral joint arthritis.  She has been having shoulder pain for years, right worse than left.  She has a permanent restriction of motion in all directions.  She has daily pain that limits her quality of life.  Review of Systems is noted in the HPI, as appropriate  Objective:   BP 102/70   Pulse 65   Temp 99 F (37.2 C) (Oral)   Ht 5' 7 (1.702 m)   Wt 235 lb 2 oz (106.7 kg)   SpO2 97%   BMI 36.83 kg/m   GEN: No acute distress; alert,appropriate. PULM: Breathing comfortably in no respiratory distress PSYCH: Normally interactive.    Shoulder: R and L Inspection: No muscle wasting or winging Ecchymosis/edema: neg  AC joint, scapula, clavicle: AC joint is mildly tender to palpation Cervical spine:  NT, full ROM Spurling's: neg Abduction: 4+/5, LIMITED TO actively roughly 90 degrees, but passively to 120 DEGREES Flexion: 5/5, LIMITED TO 95 DEGREES ACTIVELY, BUT ROUGHLY 120 PASSIVELY DEGNO ROM  IR, lift-off: 5/5. TESTED AT 90 DEGREES OF ABDUCTION, LIMITED TO 0 DEGREES ER at neutral:  5/5, TESTED AT 90 DEGREES OF ABDUCTION, LIMITED TO 15 DEGREES AC crossover and compression: PAIN Drop Test: neg Empty Can: neg Supraspinatus insertion: NT Bicipital groove: NT ALL OTHER SPECIAL TESTING EQUIVOCAL GIVEN LOSS OF MOTION C5-T1 intact Sensation intact Grip 5/5    Laboratory and Imaging Data: CLINICAL DATA:  Shoulder pain for months. Fall today. On blood thinners.   EXAM: RIGHT SHOULDER - 2+ VIEW; LEFT SHOULDER - 2+ VIEW   COMPARISON:  Radiographs of the left shoulder 06/06/2019. Left clavicle radiographs 12/29/2021.   FINDINGS: There is no evidence of acute fracture or dislocation. Moderate to severe right and mild left glenohumeral degenerative changes are present. There is prominent subchondral cyst formation within the right humeral head and glenoid. There are mild acromioclavicular degenerative changes bilaterally. Aortic atherosclerosis and thoracic spine paraspinal osteophytes noted.   IMPRESSION: 1. No evidence of acute fracture or dislocation. 2. Moderate to severe right and mild left glenohumeral degenerative changes.     Electronically Signed   By: Elmon Hagedorn M.D.   On: 12/24/2022 14:04  - Glenohumeral joint appears to have moderate arthritis in my opinion  Assessment and Plan:     ICD-10-CM   1. Arthritis of right glenohumeral joint  M19.011 Ambulatory referral to Physical Therapy    2. Chronic right shoulder pain  M25.511 Ambulatory referral to Physical Therapy   G89.29      Total encounter time: 31 minutes. This includes total time spent on the day of encounter.  Extensive conversation regarding anatomy using x-rays, models, and anatomical pictures  to review shoulder pain and anatomy.  Patient has chronic right-sided shoulder pain ongoing for intermittently years, and radiographically she appears to have moderate glenohumeral joint arthritis.  I think realistically she can improve her motion at least somewhat with physical therapy and directed home rehab.  Thank you she works on her rotator cuff and scapular stabilization musculature, then she can realistically see some improvement with pain and function.  It is not realistic that she will be pain-free and asymptomatic.  We talked directly about goals, and I think if she has any kind of functional improvement then it is a win.  She will follow-up with me in 2 months.  Orders placed today for conditions managed today: Orders Placed This Encounter  Procedures   Ambulatory referral to Physical Therapy    Disposition: Return in about 2 months (around 10/02/2023) for Dr. Geralyn Knee, R shoulder pain.  Dr[employer] One speech-to-text software was used for transcription in this dictation.  Possible transcriptional errors can occur using Animal nutritionist.   Signed,  Ranny Bye. Brenin Heidelberger, MD   Outpatient Encounter Medications as of 08/02/2023  Medication Sig   allopurinol  (ZYLOPRIM ) 100 MG tablet TAKE 2 TABLETS BY MOUTH EVERY DAY   ALPRAZolam  (XANAX ) 0.25 MG tablet Take 1 tablet (0.25 mg total) by mouth daily as needed for anxiety.   amLODipine  (NORVASC ) 5 MG tablet Take 1 tablet (5 mg total) by mouth daily. Take additional 5 mg in pm if BP>130/>80   apixaban  (ELIQUIS ) 5 MG TABS tablet Take 1 tablet (5 mg total) by mouth 2 (two) times daily.   atorvastatin  (LIPITOR) 20 MG tablet TAKE 1 TABLET BY MOUTH EVERYDAY AT BEDTIME   DULoxetine  (CYMBALTA ) 60 MG capsule Take 1 capsule (60 mg total) by mouth daily.   furosemide  (LASIX ) 40 MG tablet Take 1 tablet (40 mg total) by mouth daily.   hydrocortisone  2.5 % cream Apply topically 2 (two) times daily.   magnesium oxide (MAG-OX) 400 (240 Mg) MG  tablet Take 400 mg by mouth daily.   metoprolol  succinate (TOPROL -XL) 100 MG 24 hr tablet TAKE WITH OR IMMEDIATELY FOLLOWING A MEAL.   nystatin  cream (MYCOSTATIN ) Apply 1 Application topically 2 (two) times daily. Prn right back   oxybutynin  (DITROPAN  XL) 15 MG 24 hr tablet Take 1 tablet (15 mg total) by mouth at bedtime.   potassium chloride  SA (KLOR-CON  M) 20 MEQ tablet TAKE 1 TABLET (20 MEQ TOTAL) BY MOUTH DAILY. OVERDUE FOLLOW UP VISIT. PLEASE CALL OFFICE TO SCHEDULE APPOINTMENT PRIOR TO NEXT REFILL (FIRST ATTEMPT)   triamcinolone  cream (KENALOG ) 0.1 % Apply 1 Application topically 2 (two) times daily. To patchy rash   [DISCONTINUED] DULoxetine  40 MG CPEP Take 1 capsule (40 mg total) by mouth daily.   [DISCONTINUED] oxybutynin  (DITROPAN -XL) 10 MG 24 hr tablet Take 1 tablet (10 mg total) by mouth daily.   No facility-administered encounter medications on file as of 08/02/2023.

## 2023-08-02 ENCOUNTER — Encounter: Payer: Self-pay | Admitting: Internal Medicine

## 2023-08-02 ENCOUNTER — Ambulatory Visit: Admitting: Internal Medicine

## 2023-08-02 ENCOUNTER — Encounter: Payer: Self-pay | Admitting: Family Medicine

## 2023-08-02 ENCOUNTER — Ambulatory Visit: Admitting: Family Medicine

## 2023-08-02 VITALS — BP 110/70 | HR 97 | Ht 67.0 in | Wt 235.0 lb

## 2023-08-02 VITALS — BP 102/70 | HR 65 | Temp 99.0°F | Ht 67.0 in | Wt 235.1 lb

## 2023-08-02 DIAGNOSIS — N3281 Overactive bladder: Secondary | ICD-10-CM

## 2023-08-02 DIAGNOSIS — G8929 Other chronic pain: Secondary | ICD-10-CM

## 2023-08-02 DIAGNOSIS — M25511 Pain in right shoulder: Secondary | ICD-10-CM | POA: Diagnosis not present

## 2023-08-02 DIAGNOSIS — F419 Anxiety disorder, unspecified: Secondary | ICD-10-CM

## 2023-08-02 DIAGNOSIS — I872 Venous insufficiency (chronic) (peripheral): Secondary | ICD-10-CM

## 2023-08-02 DIAGNOSIS — I110 Hypertensive heart disease with heart failure: Secondary | ICD-10-CM | POA: Diagnosis not present

## 2023-08-02 DIAGNOSIS — M19011 Primary osteoarthritis, right shoulder: Secondary | ICD-10-CM

## 2023-08-02 DIAGNOSIS — R944 Abnormal results of kidney function studies: Secondary | ICD-10-CM

## 2023-08-02 MED ORDER — OXYBUTYNIN CHLORIDE ER 15 MG PO TB24: 15.0000 mg | ORAL_TABLET | Freq: Every day | ORAL | 2 refills | Status: DC

## 2023-08-02 MED ORDER — DULOXETINE HCL 60 MG PO CPEP: 60.0000 mg | ORAL_CAPSULE | Freq: Every day | ORAL | 1 refills | Status: DC

## 2023-08-02 NOTE — Patient Instructions (Addendum)
 Place your stockings on in the MORNING BEFORE YOU GET OUT OF BED. . IT WILL BE EASIER BECAUSE YOUR LEGS ARE THINNER THEN   I HAVE INCREASED THE OXYBUTYNIN    TO 15 MG DAILY  FOR YOUR OVERACTIVE BLADDER  REDUCE  FUROSEMIDE  TO ONCE A WEEK   IGNORE THE PHARMACIST'S WARNINGS !  We are aware of potential interactions  TRY REDUCING YOUR ANXIETY PILL TO 1/2 TABLET (alprazolam )   increase the duloxetine  to 60 mg daily (cymbalta ) .  New rx has been sent

## 2023-08-02 NOTE — Progress Notes (Signed)
 Subjective:  Patient ID: Monica [name], female    DOB: [birth year]  Age: 88 y.o. MRN: [id number]  CC: The primary encounter diagnosis was Decreased GFR. Diagnoses of Other chronic pain, Overactive bladder, Anxiety, Chronic venous insufficiency, and Hypertensive heart disease with heart failure (HCC) were also pertinent to this visit.   HPI Monica [name] presents for  Chief Complaint  Patient presents with   Medical Management of Chronic Issues    1 month follow up on anxiety    1) anxiety:  she has had a falling out with her daughter over financial matters and has been trying to manage her feelings but did not have a productive meeting with the therapist due to her inability to focus on her feelings.   2) OAB:   not improved with Detrol,  so she has suspended daily use of furosemide  . Apparently did this several months ago .  She is frustrated by having so much incontinence. .  She has no history of systolic dysfunction or right sided heart failure. It's just too much    3) seeing Sports Medicine [facility] FOR RIGHT SHOULDER PAIN TODAY ,  DID NOT LIKE THE ORTHOPEDIST she saw   4) HTN:  Patient is taking her medications as prescribed and notes no adverse effects.  Home BP readings have been done about once per week and are  generally < 130/80 .  She is avoiding added salt in her diet and walking regularly about 3 times per week for exercise  .     Outpatient Medications Prior to Visit  Medication Sig Dispense Refill   allopurinol  (ZYLOPRIM ) 100 MG tablet TAKE 2 TABLETS BY MOUTH EVERY DAY 180 tablet 3   ALPRAZolam  (XANAX ) 0.25 MG tablet Take 1 tablet (0.25 mg total) by mouth daily as needed for anxiety. 30 tablet 2   amLODipine  (NORVASC ) 5 MG tablet Take 1 tablet (5 mg total) by mouth daily. Take additional 5 mg in pm if BP>130/>80 180 tablet 1   apixaban  (ELIQUIS ) 5 MG TABS tablet Take 1 tablet (5 mg total) by mouth 2 (two) times daily. 180 tablet 2   atorvastatin  (LIPITOR) 20  MG tablet TAKE 1 TABLET BY MOUTH EVERYDAY AT BEDTIME 90 tablet 3   furosemide  (LASIX ) 40 MG tablet Take 1 tablet (40 mg total) by mouth daily. 90 tablet 3   hydrocortisone  2.5 % cream Apply topically 2 (two) times daily. 30 g 2   magnesium oxide (MAG-OX) 400 (240 Mg) MG tablet Take 400 mg by mouth daily.     metoprolol  succinate (TOPROL -XL) 100 MG 24 hr tablet TAKE WITH OR IMMEDIATELY FOLLOWING A MEAL. 90 tablet 3   nystatin  cream (MYCOSTATIN ) Apply 1 Application topically 2 (two) times daily. Prn right back 30 g 11   potassium chloride  SA (KLOR-CON  M) 20 MEQ tablet TAKE 1 TABLET (20 MEQ TOTAL) BY MOUTH DAILY. OVERDUE FOLLOW UP VISIT. PLEASE CALL OFFICE TO SCHEDULE APPOINTMENT PRIOR TO NEXT REFILL (FIRST ATTEMPT) 90 tablet 1   triamcinolone  cream (KENALOG ) 0.1 % Apply 1 Application topically 2 (two) times daily. To patchy rash 30 g 0   DULoxetine  40 MG CPEP Take 1 capsule (40 mg total) by mouth daily. 90 capsule 1   oxybutynin  (DITROPAN -XL) 10 MG 24 hr tablet Take 1 tablet (10 mg total) by mouth daily. 30 tablet 11   No facility-administered medications prior to visit.    Review of Systems;  Patient denies headache, fevers, malaise, unintentional weight loss, skin rash, eye  pain, sinus congestion and sinus pain, sore throat, dysphagia,  hemoptysis , cough, dyspnea, wheezing, chest pain, palpitations, orthopnea, edema, abdominal pain, nausea, melena, diarrhea, constipation, flank pain, dysuria, hematuria, urinary  Frequency, nocturia, numbness, tingling, seizures,  Focal weakness, Loss of consciousness,  Tremor, insomnia, depression, anxiety, and suicidal ideation.      Objective:  BP 110/70   Pulse 97   Ht 5' 7 (1.702 m)   Wt 235 lb (106.6 kg)   SpO2 97%   BMI 36.81 kg/m   BP Readings from Last 3 Encounters:  08/02/23 102/70  08/02/23 110/70  06/20/23 130/74    Wt Readings from Last 3 Encounters:  08/02/23 235 lb 2 oz (106.7 kg)  08/02/23 235 lb (106.6 kg)  06/20/23 233 lb 12.8  oz (106.1 kg)    Physical Exam Vitals reviewed.  Constitutional:      General: She is not in acute distress.    Appearance: Normal appearance. She is normal weight. She is not ill-appearing, toxic-appearing or diaphoretic.  HENT:     Head: Normocephalic.   Eyes:     General: No scleral icterus.       Right eye: No discharge.        Left eye: No discharge.     Conjunctiva/sclera: Conjunctivae normal.    Cardiovascular:     Rate and Rhythm: Normal rate and regular rhythm.     Heart sounds: Normal heart sounds.  Pulmonary:     Effort: Pulmonary effort is normal. No respiratory distress.     Breath sounds: Normal breath sounds.   Musculoskeletal:        General: Normal range of motion.   Skin:    General: Skin is warm and dry.   Neurological:     General: No focal deficit present.     Mental Status: She is alert and oriented to person, place, and time. Mental status is at baseline.   Psychiatric:        Mood and Affect: Mood normal.        Behavior: Behavior normal.        Thought Content: Thought content normal.        Judgment: Judgment normal.    Lab Results  Component Value Date   HGBA1C 6.3 06/20/2023   HGBA1C 6.3 07/19/2022   HGBA1C 6.4 04/19/2022    Lab Results  Component Value Date   CREATININE 1.12 (H) 08/02/2023   CREATININE 1.41 (H) 07/21/2023   CREATININE 1.08 06/20/2023    Lab Results  Component Value Date   WBC 7.0 03/24/2023   HGB 12.7 03/24/2023   HCT 39.6 03/24/2023   PLT 253 03/24/2023   GLUCOSE 108 (H) 08/02/2023   CHOL 119 03/24/2023   TRIG 104 03/24/2023   HDL 40 03/24/2023   LDLDIRECT 64 03/24/2023   LDLCALC 60 03/24/2023   ALT 20 07/21/2023   AST 19 07/21/2023   NA 142 08/02/2023   K 4.0 08/02/2023   CL 104 08/02/2023   CREATININE 1.12 (H) 08/02/2023   BUN 18 08/02/2023   CO2 18 (L) 08/02/2023   TSH 2.19 04/19/2022   INR 1.0 09/01/2018   HGBA1C 6.3 06/20/2023   MICROALBUR <0.7 06/21/2023    MM 3D SCREENING MAMMOGRAM  BILATERAL BREAST Result Date: 07/03/2023 CLINICAL DATA:  Screening. EXAM: DIGITAL SCREENING BILATERAL MAMMOGRAM WITH TOMOSYNTHESIS AND CAD TECHNIQUE: Bilateral screening digital craniocaudal and mediolateral oblique mammograms were obtained. Bilateral screening digital breast tomosynthesis was performed. The images were evaluated with computer-aided detection. COMPARISON:  Previous exam(s). ACR Breast Density Category b: There are scattered areas of fibroglandular density. FINDINGS: There are no findings suspicious for malignancy. IMPRESSION: No mammographic evidence of malignancy. A result letter of this screening mammogram will be mailed directly to the patient. RECOMMENDATION: Screening mammogram in one year. (Code:SM-B-01Y) BI-RADS CATEGORY  1: Negative. Electronically Signed   By: Sundra Engel M.D.   On: 07/03/2023 14:52   DG Bone Density Result Date: 06/29/2023 EXAM: DUAL X-RAY ABSORPTIOMETRY (DXA) FOR BONE MINERAL DENSITY 06/29/2023 3:15 pm CLINICAL DATA:  88 year old Female Postmenopausal. Estrogen deficiency TECHNIQUE: An axial (e.g., hips, spine) and/or appendicular (e.g., radius) exam was performed, as appropriate, using GE Secretary/administrator at [facility]. Images are obtained for bone mineral density measurement and are not obtained for diagnostic purposes. [id number] Exclusions: L3-L4 COMPARISON:  03/05/2015 FINDINGS: Scan quality: Good. LUMBAR SPINE (L1-L2): BMD (in g/cm2): 1.272 T-score: 0.8 Z-score: 2.1 Rate of change from previous exam: 5.6 % LEFT FEMORAL NECK: BMD (in g/cm2): 0.967 T-score: -0.5 Z-score: 1.0 LEFT TOTAL HIP: BMD (in g/cm2): 1.059 T-score: 0.4 Z-score: 1.8 RIGHT FEMORAL NECK: BMD (in g/cm2): 0.890 T-score: -1.1 Z-score: 0.4 Rate of change from previous exam: No significant rate of change from previous exam. RIGHT TOTAL HIP: BMD (in g/cm2): 0.905 T-score: -0.8 Z-score: 0.5 DUAL-FEMUR TOTAL MEAN: Rate of change from previous exam: -5.8 % LEFT FOREARM (RADIUS  33%): BMD (in g/cm2): 0.910 T-score: 0.4 Z-score: 2.9 Rate of change from previous exam: -4.5 % FRAX 10-YEAR PROBABILITY OF FRACTURE: 10-year fracture risk is performed using the [facility] FRAX calculator based on patient-reported risk factors. Major osteoporotic fracture: 6.3% Hip fracture: 1.5% Other situations known to alter the reliability of the FRAX score should be considered when making treatment decisions, including chronic glucocorticoid use and past treatments. Further guidance on treatment can be found at the [facility] Osteoporosis Foundation's website https://www.patton.com/. IMPRESSION: Osteopenia based on BMD. Fracture risk is increased. Increased risk is based on low BMD. RECOMMENDATIONS: 1. All patients should optimize calcium  and vitamin D  intake. 2. Consider FDA-approved medical therapies in postmenopausal women and men aged 13 years and older, based on the following: - A hip or vertebral (clinical or morphometric) fracture - T-score less than or equal to -2.5 and secondary causes have been excluded. - Low bone mass (T-score between -1.0 and -2.5) and a 10-year probability of a hip fracture greater than or equal to 3% or a 10-year probability of a major osteoporosis-related fracture greater than or equal to 20% based on the [place] -adapted WHO algorithm. - Clinician judgment and/or patient preferences may indicate treatment for people with 10-year fracture probabilities above or below these levels 3. Patients with diagnosis of osteoporosis or at high risk for fracture should have regular bone mineral density tests. For patients eligible for Medicare, routine testing is allowed once every 2 years. The testing frequency can be increased to one year for patients who have rapidly progressing disease, those who are receiving or discontinuing medical therapy to restore bone mass, or have additional risk factors. Electronically Signed   By: Sundra Engel M.D.   On: 06/29/2023 15:39    Assessment & Plan:   .Decreased GFR -     Basic metabolic panel with GFR  Other chronic pain  Overactive bladder Assessment & Plan: Increasing oxybutynin  ER to 15 mg daily; agree with suspending furosemide     Anxiety Assessment & Plan: Advised to reduce alprazolam  to.5 tablets to reduce light headed sensation    Chronic  venous insufficiency Assessment & Plan: Suspected by exam and history.  pathophysiology explained and Proper use of compression stockings described.    Hypertensive heart disease with heart failure (HCC) Assessment & Plan: Her BP is Well controlled on current regimen.and renal function has improved with suspension of daily furosemide  without weight gain.  Lab Results  Component Value Date   CREATININE 1.12 (H) 08/02/2023   Lab Results  Component Value Date   NA 142 08/02/2023   K 4.0 08/02/2023   CL 104 08/02/2023   CO2 18 (L) 08/02/2023      Other orders -     oxyBUTYnin  Chloride ER; Take 1 tablet (15 mg total) by mouth at bedtime.  Dispense: 30 tablet; Refill: 2 -     DULoxetine  HCl; Take 1 capsule (60 mg total) by mouth daily.  Dispense: 90 capsule; Refill: 1     I spent 30 minutes on the day of this face to face encounter reviewing patient's  most recent visit with cardiology,  prior relevant surgical and non surgical procedures, recent  labs and imaging studies, counseling on management of anxiety and conflictwith family ,  reviewing the assessment and plan with patient, and post visit ordering and reviewing of  diagnostics and therapeutics with patient  .   Follow-up: Return in about 4 weeks (around 08/30/2023).   Thersia Flax, MD

## 2023-08-03 ENCOUNTER — Telehealth: Payer: Self-pay

## 2023-08-03 ENCOUNTER — Ambulatory Visit: Payer: Self-pay | Admitting: Internal Medicine

## 2023-08-03 DIAGNOSIS — F419 Anxiety disorder, unspecified: Secondary | ICD-10-CM | POA: Insufficient documentation

## 2023-08-03 DIAGNOSIS — I872 Venous insufficiency (chronic) (peripheral): Secondary | ICD-10-CM | POA: Insufficient documentation

## 2023-08-03 LAB — BASIC METABOLIC PANEL WITH GFR
BUN/Creatinine Ratio: 16 (ref 12–28)
BUN: 18 mg/dL (ref 8–27)
CO2: 18 mmol/L — ABNORMAL LOW (ref 20–29)
Calcium: 9.9 mg/dL (ref 8.7–10.3)
Chloride: 104 mmol/L (ref 96–106)
Creatinine, Ser: 1.12 mg/dL — ABNORMAL HIGH (ref 0.57–1.00)
Glucose: 108 mg/dL — ABNORMAL HIGH (ref 70–99)
Potassium: 4 mmol/L (ref 3.5–5.2)
Sodium: 142 mmol/L (ref 134–144)
eGFR: 48 mL/min/{1.73_m2} — ABNORMAL LOW (ref >59)

## 2023-08-03 NOTE — Assessment & Plan Note (Signed)
 Advised to reduce alprazolam  to.5 tablets to reduce light headed sensation

## 2023-08-03 NOTE — Assessment & Plan Note (Signed)
 Her BP is Well controlled on current regimen.and renal function has improved with suspension of daily furosemide  without weight gain.  Lab Results  Component Value Date   CREATININE 1.12 (H) 08/02/2023   Lab Results  Component Value Date   NA 142 08/02/2023   K 4.0 08/02/2023   CL 104 08/02/2023   CO2 18 (L) 08/02/2023

## 2023-08-03 NOTE — Telephone Encounter (Signed)
 Copied from CRM [id number]. Topic: Clinical - Lab/Test Results >> Aug 03, 2023 11:44 AM Juleen Oakland F wrote: Reason for CRM: patient returned call regarding lab results, I relayed results to patient and she has no further questions.

## 2023-08-03 NOTE — Assessment & Plan Note (Signed)
 Suspected by exam and history.  pathophysiology explained and Proper use of compression stockings described.

## 2023-08-03 NOTE — Assessment & Plan Note (Signed)
 Increasing oxybutynin  ER to 15 mg daily; agree with suspending furosemide 

## 2023-08-04 NOTE — Telephone Encounter (Signed)
 Duplicate message

## 2023-09-20 IMAGING — MG MM DIGITAL SCREENING BILAT W/ TOMO AND CAD
6 of 12 series · 6 of 36 positions shown · non-contrast
Comparison: Previous exam(s).

CLINICAL DATA: Screening.

EXAM:
DIGITAL SCREENING BILATERAL MAMMOGRAM WITH TOMOSYNTHESIS AND CAD
TECHNIQUE: Bilateral screening digital craniocaudal and mediolateral oblique
mammograms were obtained. Bilateral screening digital breast
tomosynthesis was performed. The images were evaluated with
computer-aided detection.

[R MLO synth-2D (1 of 3)]
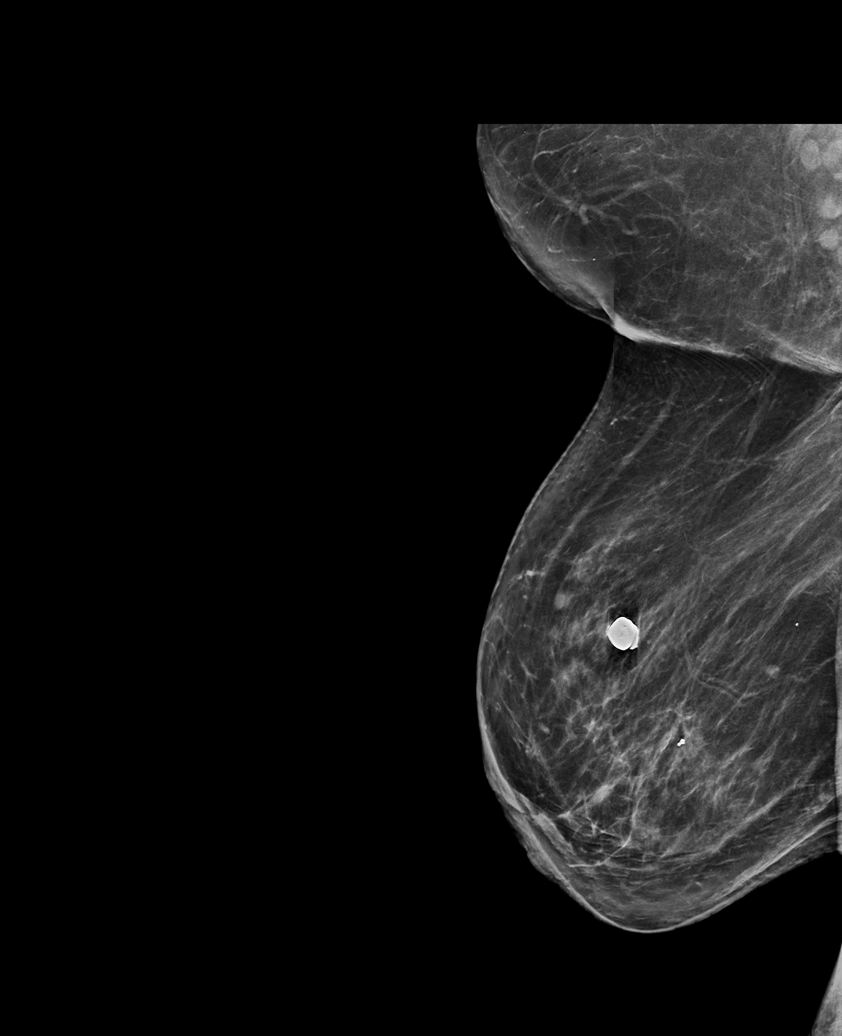

[L CC synth-2D]
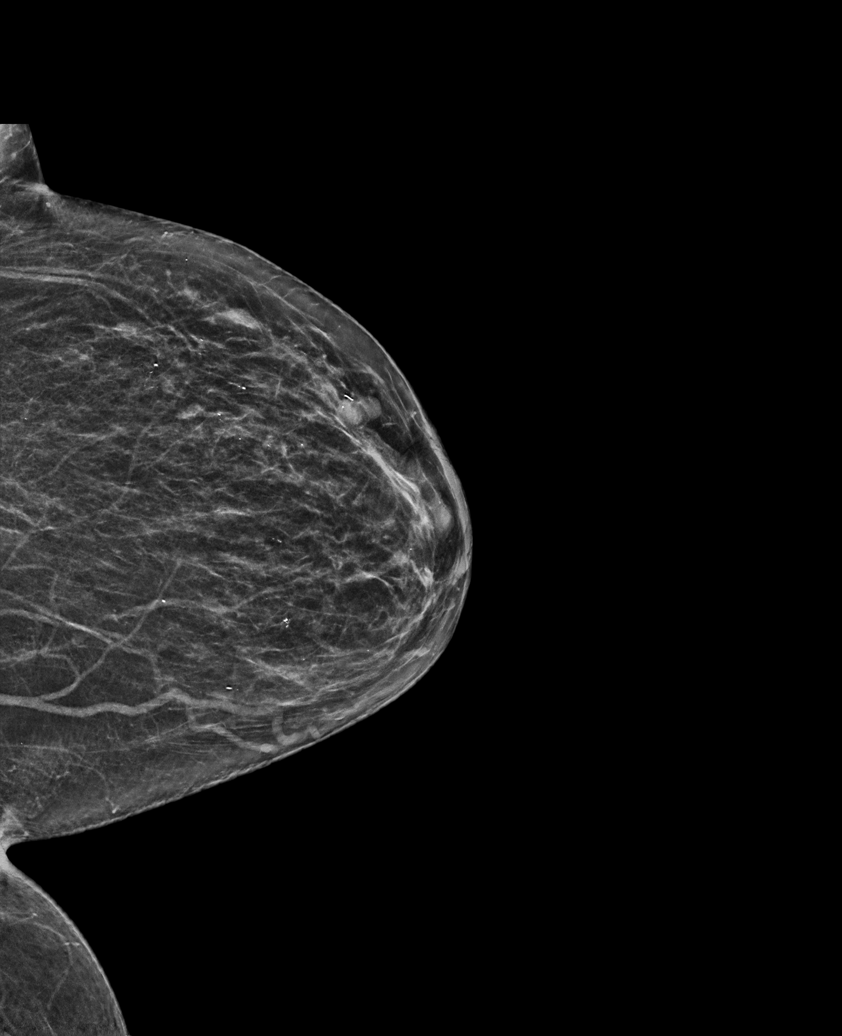

[R MLO synth-2D (2 of 3)]
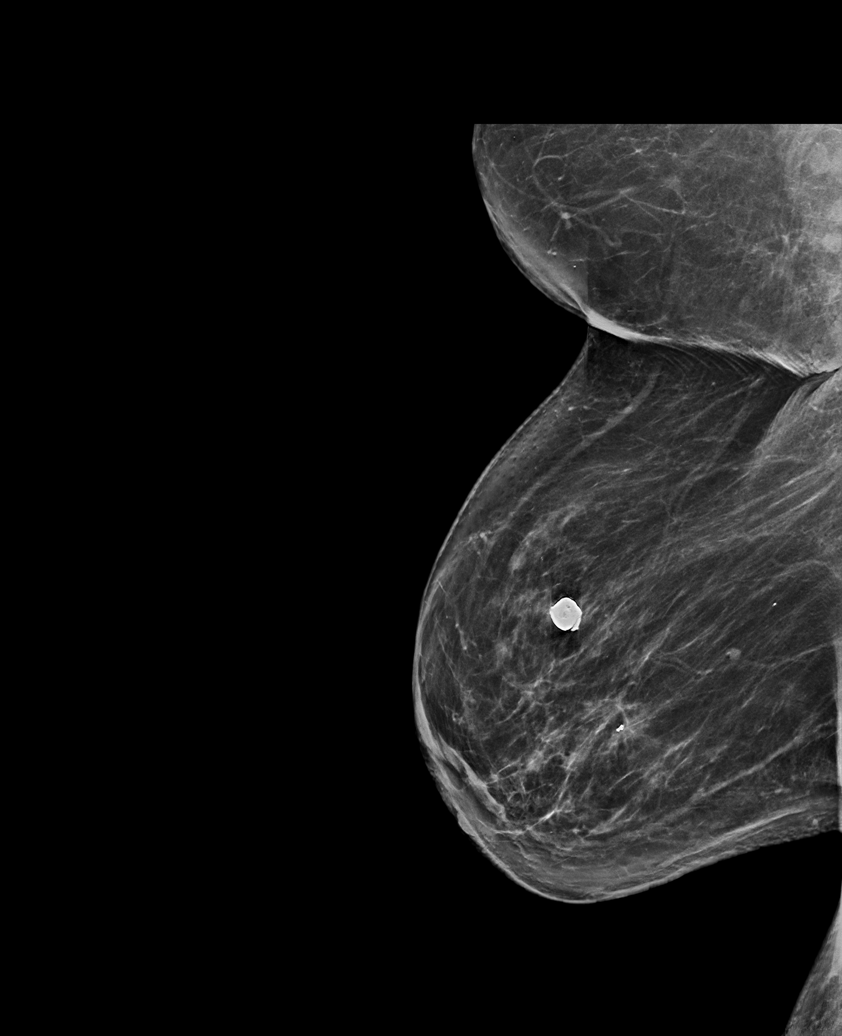

[R MLO synth-2D (3 of 3)]
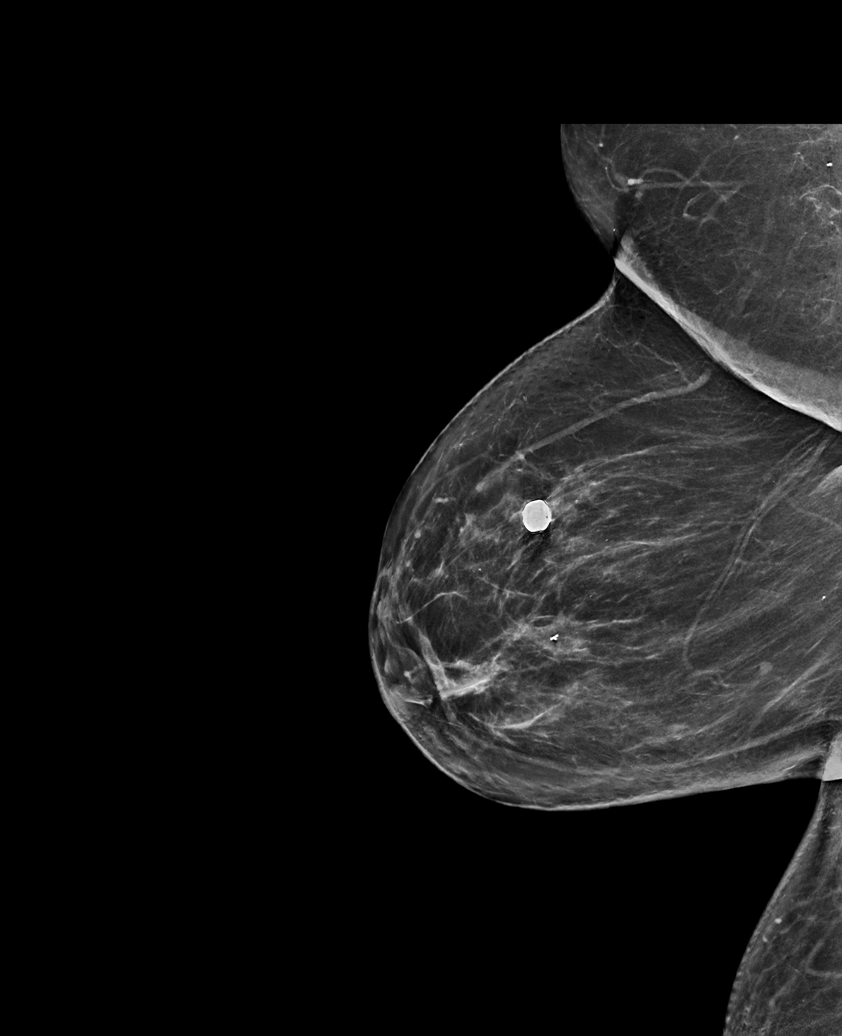

[R CC synth-2D]
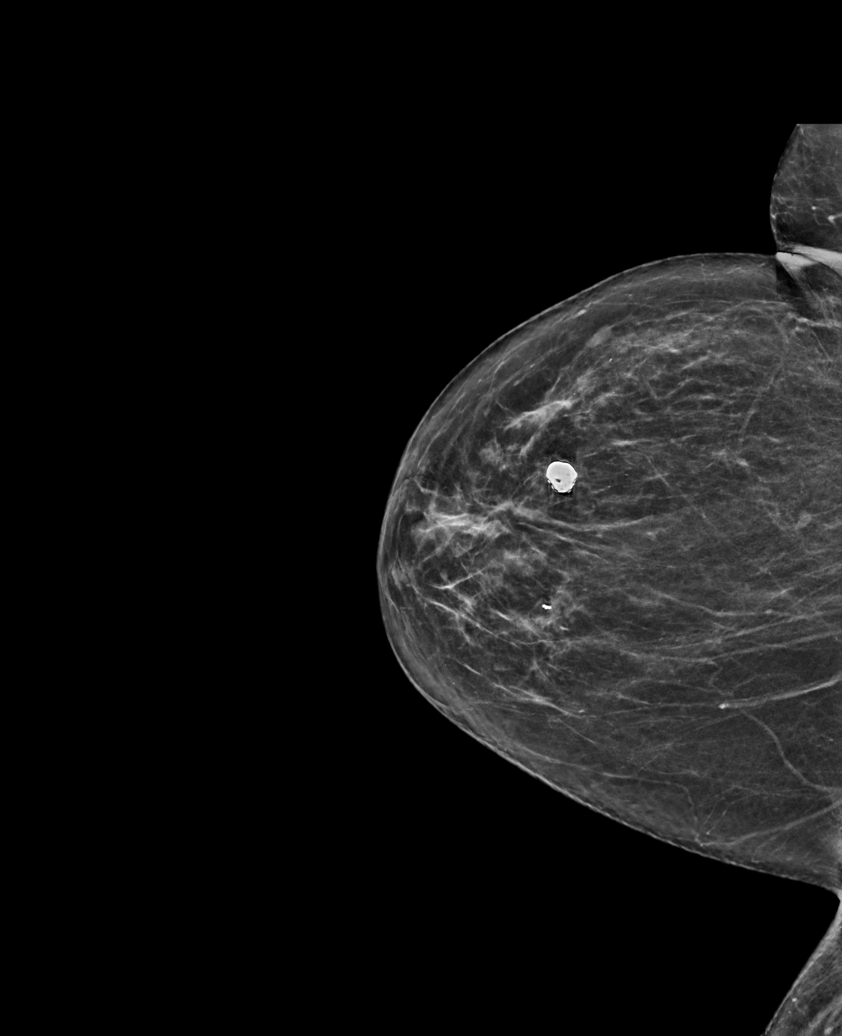

[L MLO synth-2D]
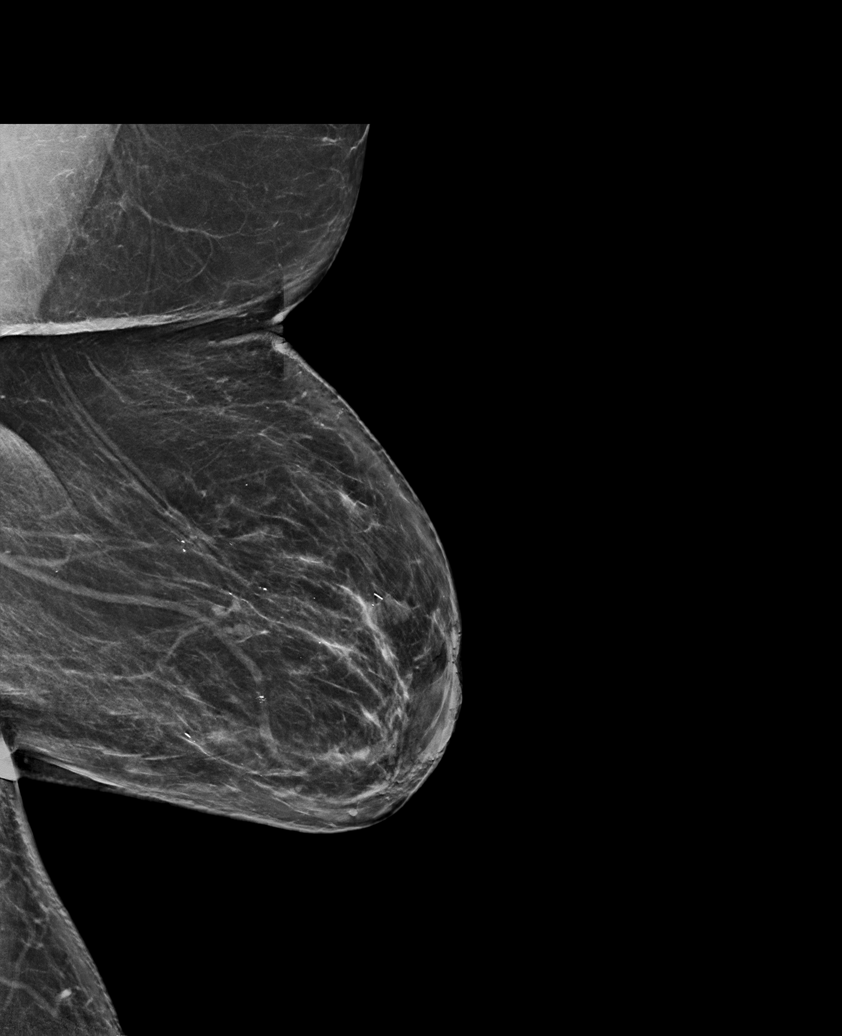

[6 of 36 positions shown; findings below may reference images not displayed]

ACR Breast Density Category b: There are scattered areas of
fibroglandular density.
FINDINGS: There are no findings suspicious for malignancy.
IMPRESSION: No mammographic evidence of malignancy. A result letter of this
screening mammogram will be mailed directly to the patient.

RECOMMENDATION:
Screening mammogram in one year. (Code:51-O-LD2)

BI-RADS CATEGORY  1: Negative.

## 2023-10-02 ENCOUNTER — Ambulatory Visit: Admitting: Family Medicine

## 2023-10-24 ENCOUNTER — Other Ambulatory Visit: Payer: Self-pay | Admitting: Internal Medicine

## 2023-10-28 ENCOUNTER — Other Ambulatory Visit: Payer: Self-pay | Admitting: Cardiovascular Disease

## 2023-10-28 DIAGNOSIS — E876 Hypokalemia: Secondary | ICD-10-CM

## 2023-11-01 ENCOUNTER — Other Ambulatory Visit: Payer: Self-pay | Admitting: Internal Medicine

## 2023-11-28 ENCOUNTER — Encounter: Payer: Self-pay | Admitting: Internal Medicine

## 2023-11-28 ENCOUNTER — Ambulatory Visit: Admitting: Internal Medicine

## 2023-11-28 VITALS — BP 114/84 | HR 109 | Ht 67.0 in | Wt 225.4 lb

## 2023-11-28 DIAGNOSIS — R350 Frequency of micturition: Secondary | ICD-10-CM | POA: Diagnosis not present

## 2023-11-28 DIAGNOSIS — N3281 Overactive bladder: Secondary | ICD-10-CM

## 2023-11-28 DIAGNOSIS — E7849 Other hyperlipidemia: Secondary | ICD-10-CM

## 2023-11-28 DIAGNOSIS — R5383 Other fatigue: Secondary | ICD-10-CM

## 2023-11-28 DIAGNOSIS — I10 Essential (primary) hypertension: Secondary | ICD-10-CM

## 2023-11-28 DIAGNOSIS — R7303 Prediabetes: Secondary | ICD-10-CM

## 2023-11-28 DIAGNOSIS — F419 Anxiety disorder, unspecified: Secondary | ICD-10-CM | POA: Diagnosis not present

## 2023-11-28 LAB — LIPID PANEL
Cholesterol: 121 mg/dL (ref 0–200)
HDL: 40.4 mg/dL (ref >39.00)
LDL Cholesterol: 64 mg/dL (ref 0–99)
NonHDL: 80.87
Total CHOL/HDL Ratio: 3
Triglycerides: 86 mg/dL (ref 0.0–149.0)
VLDL: 17.2 mg/dL (ref 0.0–40.0)

## 2023-11-28 LAB — COMPREHENSIVE METABOLIC PANEL WITH GFR
ALT: 13 U/L (ref 0–35)
AST: 15 U/L (ref 0–37)
Albumin: 4.1 g/dL (ref 3.5–5.2)
Alkaline Phosphatase: 102 U/L (ref 39–117)
BUN: 19 mg/dL (ref 6–23)
CO2: 29 meq/L (ref 19–32)
Calcium: 9.7 mg/dL (ref 8.4–10.5)
Chloride: 104 meq/L (ref 96–112)
Creatinine, Ser: 1.01 mg/dL (ref 0.40–1.20)
GFR: 50.5 mL/min — ABNORMAL LOW (ref >60.00)
Glucose, Bld: 109 mg/dL — ABNORMAL HIGH (ref 70–99)
Potassium: 4.3 meq/L (ref 3.5–5.1)
Sodium: 141 meq/L (ref 135–145)
Total Bilirubin: 0.7 mg/dL (ref 0.2–1.2)
Total Protein: 6.6 g/dL (ref 6.0–8.3)

## 2023-11-28 LAB — CBC WITH DIFFERENTIAL/PLATELET
Basophils Absolute: 0.1 K/uL (ref 0.0–0.1)
Basophils Relative: 0.9 % (ref 0.0–3.0)
Eosinophils Absolute: 0.2 K/uL (ref 0.0–0.7)
Eosinophils Relative: 2.5 % (ref 0.0–5.0)
HCT: 40.4 % (ref 36.0–46.0)
Hemoglobin: 13.1 g/dL (ref 12.0–15.0)
Lymphocytes Relative: 25.3 % (ref 12.0–46.0)
Lymphs Abs: 1.6 K/uL (ref 0.7–4.0)
MCHC: 32.4 g/dL (ref 30.0–36.0)
MCV: 87.8 fl (ref 78.0–100.0)
Monocytes Absolute: 0.5 K/uL (ref 0.1–1.0)
Monocytes Relative: 7.5 % (ref 3.0–12.0)
Neutro Abs: 4 K/uL (ref 1.4–7.7)
Neutrophils Relative %: 63.8 % (ref 43.0–77.0)
Platelets: 259 K/uL (ref 150.0–400.0)
RBC: 4.6 Mil/uL (ref 3.87–5.11)
RDW: 16 % — ABNORMAL HIGH (ref 11.5–15.5)
WBC: 6.3 K/uL (ref 4.0–10.5)

## 2023-11-28 LAB — HEMOGLOBIN A1C: Hgb A1c MFr Bld: 6.4 % (ref 4.6–6.5)

## 2023-11-28 LAB — LDL CHOLESTEROL, DIRECT: Direct LDL: 72 mg/dL

## 2023-11-28 MED ORDER — AMLODIPINE BESYLATE 5 MG PO TABS: 5.0000 mg | ORAL_TABLET | Freq: Every day | ORAL | 1 refills | Status: AC

## 2023-11-28 MED ORDER — DULOXETINE HCL 60 MG PO CPEP: 60.0000 mg | ORAL_CAPSULE | Freq: Every day | ORAL | 1 refills | Status: AC

## 2023-11-28 NOTE — Assessment & Plan Note (Signed)
 Improved with current regimen of suspended furosemide  and daily oxybutynin .  No changes today

## 2023-11-28 NOTE — Patient Instructions (Addendum)
 I'm glad you are feeling better!  You have lost ten lbs just by being more active.!  Get some walking sticks and start going for a walk daily   We did not make any medication changes today  The oxybutynin  is causing the dry mouth,  but since it is helping your bladder,  continue the medication and use  lozenges to manage the dry mouth   I recommend [employer] Sneakers for good arch support

## 2023-11-28 NOTE — Assessment & Plan Note (Signed)
 Continue oxybutynin.

## 2023-11-28 NOTE — Progress Notes (Signed)
 Subjective:  Patient ID: Monica [name], female    DOB: 03-16-1935  Age: 88 y.o. MRN: [id number]  CC: The primary encounter diagnosis was Other hyperlipidemia. Diagnoses of Hypertension, unspecified type, Prediabetes, Other fatigue, Urinary frequency, Overactive bladder, Morbid (severe) obesity due to excess calories (HCC), and Anxiety were also pertinent to this visit.   HPI Monica [name] presents for  Chief Complaint  Patient presents with   Medical Management of Chronic Issues    1) ANXIETY:   her symptoms have significantly improved after confronting her daughter and creating boundaries . She is using alprazolam  less than daily   2) urinary incontinence /OAB:  ishe is having less incontinence and frequency. Since stopping furosemide  and increasing the dose of oxybutynin .  she is having s/e of dry mouth since dose was increased at last visit . Prior trials of trospium , myrbetriq  and gemtesa  offered by urology were either not started or stopped prematurely per review of urology note March 2025.  She is not wearing a pad all the time anymore   3) obesity:  has been exercising more,  sitting less,   has lost 10 lbs since last visit but still bothered y her belly fat and thigh fat   Outpatient Medications Prior to Visit  Medication Sig Dispense Refill   allopurinol  (ZYLOPRIM ) 100 MG tablet TAKE 2 TABLETS BY MOUTH EVERY DAY 180 tablet 3   ALPRAZolam  (XANAX ) 0.25 MG tablet Take 1 tablet (0.25 mg total) by mouth daily as needed for anxiety. 30 tablet 2   apixaban  (ELIQUIS ) 5 MG TABS tablet Take 1 tablet (5 mg total) by mouth 2 (two) times daily. 180 tablet 2   atorvastatin  (LIPITOR) 20 MG tablet TAKE 1 TABLET BY MOUTH EVERYDAY AT BEDTIME 90 tablet 3   furosemide  (LASIX ) 40 MG tablet Take 1 tablet (40 mg total) by mouth daily. 90 tablet 3   hydrocortisone  2.5 % cream Apply topically 2 (two) times daily. 30 g 2   KLOR-CON  M20 20 MEQ tablet TAKE 1 TABLET (20 MEQ TOTAL) BY MOUTH DAILY.  OVERDUE FOLLOW UP VISIT. PLEASE CALL OFFICE TO SCHEDULE APPOINTMENT PRIOR TO NEXT REFILL (FIRST ATTEMPT) 90 tablet 1   magnesium oxide (MAG-OX) 400 (240 Mg) MG tablet Take 400 mg by mouth daily.     metoprolol  succinate (TOPROL -XL) 100 MG 24 hr tablet TAKE WITH OR IMMEDIATELY FOLLOWING A MEAL. 90 tablet 3   nystatin  cream (MYCOSTATIN ) Apply 1 Application topically 2 (two) times daily. Prn right back 30 g 11   oxybutynin  (DITROPAN  XL) 15 MG 24 hr tablet TAKE 1 TABLET BY MOUTH EVERYDAY AT BEDTIME 90 tablet 1   triamcinolone  cream (KENALOG ) 0.1 % APPLY 1 APPLICATION TOPICALLY 2 (TWO) TIMES DAILY. TO PATCHY RASH 30 g 0   amLODipine  (NORVASC ) 5 MG tablet Take 1 tablet (5 mg total) by mouth daily. Take additional 5 mg in pm if BP>130/>80 180 tablet 1   DULoxetine  (CYMBALTA ) 60 MG capsule Take 1 capsule (60 mg total) by mouth daily. 90 capsule 1   No facility-administered medications prior to visit.    Review of Systems;  Patient denies headache, fevers, malaise, unintentional weight loss, skin rash, eye pain, sinus congestion and sinus pain, sore throat, dysphagia,  hemoptysis , cough, dyspnea, wheezing, chest pain, palpitations, orthopnea, edema, abdominal pain, nausea, melena, diarrhea, constipation, flank pain, dysuria, hematuria, urinary  Frequency, nocturia, numbness, tingling, seizures,  Focal weakness, Loss of consciousness,  Tremor, insomnia, depression, , and suicidal ideation.  Objective:  BP 114/84   Pulse (!) 109   Ht 5' 7 (1.702 m)   Wt 225 lb 6.4 oz (102.2 kg)   SpO2 96%   BMI 35.30 kg/m   BP Readings from Last 3 Encounters:  11/28/23 114/84  08/02/23 102/70  08/02/23 110/70    Wt Readings from Last 3 Encounters:  11/28/23 225 lb 6.4 oz (102.2 kg)  08/02/23 235 lb 2 oz (106.7 kg)  08/02/23 235 lb (106.6 kg)    Physical Exam Vitals reviewed.  Constitutional:      General: She is not in acute distress.    Appearance: Normal appearance. She is normal weight. She  is not ill-appearing, toxic-appearing or diaphoretic.  HENT:     Head: Normocephalic.  Eyes:     General: No scleral icterus.       Right eye: No discharge.        Left eye: No discharge.     Conjunctiva/sclera: Conjunctivae normal.  Cardiovascular:     Rate and Rhythm: Normal rate and regular rhythm.     Heart sounds: Normal heart sounds.  Pulmonary:     Effort: Pulmonary effort is normal. No respiratory distress.     Breath sounds: Normal breath sounds.  Musculoskeletal:        General: Normal range of motion.  Skin:    General: Skin is warm and dry.  Neurological:     General: No focal deficit present.     Mental Status: She is alert and oriented to person, place, and time. Mental status is at baseline.  Psychiatric:        Mood and Affect: Mood normal.        Behavior: Behavior normal.        Thought Content: Thought content normal.        Judgment: Judgment normal.     Lab Results  Component Value Date   HGBA1C 6.4 11/28/2023   HGBA1C 6.3 06/20/2023   HGBA1C 6.3 07/19/2022    Lab Results  Component Value Date   CREATININE 1.01 11/28/2023   CREATININE 1.12 (H) 08/02/2023   CREATININE 1.41 (H) 07/21/2023    Lab Results  Component Value Date   WBC 6.3 11/28/2023   HGB 13.1 11/28/2023   HCT 40.4 11/28/2023   PLT 259.0 11/28/2023   GLUCOSE 109 (H) 11/28/2023   CHOL 121 11/28/2023   TRIG 86.0 11/28/2023   HDL 40.40 11/28/2023   LDLDIRECT 72.0 11/28/2023   LDLCALC 64 11/28/2023   ALT 13 11/28/2023   AST 15 11/28/2023   NA 141 11/28/2023   K 4.3 11/28/2023   CL 104 11/28/2023   CREATININE 1.01 11/28/2023   BUN 19 11/28/2023   CO2 29 11/28/2023   TSH 2.19 04/19/2022   INR 1.0 09/01/2018   HGBA1C 6.4 11/28/2023   MICROALBUR <0.7 06/21/2023    MM 3D SCREENING MAMMOGRAM BILATERAL BREAST Result Date: 07/03/2023 CLINICAL DATA:  Screening. EXAM: DIGITAL SCREENING BILATERAL MAMMOGRAM WITH TOMOSYNTHESIS AND CAD TECHNIQUE: Bilateral screening digital  craniocaudal and mediolateral oblique mammograms were obtained. Bilateral screening digital breast tomosynthesis was performed. The images were evaluated with computer-aided detection. COMPARISON:  Previous exam(s). ACR Breast Density Category b: There are scattered areas of fibroglandular density. FINDINGS: There are no findings suspicious for malignancy. IMPRESSION: No mammographic evidence of malignancy. A result letter of this screening mammogram will be mailed directly to the patient. RECOMMENDATION: Screening mammogram in one year. (Code:[id number]-B-01Y) BI-RADS CATEGORY  1: Negative. Electronically Signed   By: Reyes  Hu M.D.   On: 07/03/2023 14:52   DG Bone Density Result Date: 06/29/2023 EXAM: DUAL X-RAY ABSORPTIOMETRY (DXA) FOR BONE MINERAL DENSITY 06/29/2023 3:15 pm CLINICAL DATA:  88 year old Female Postmenopausal. Estrogen deficiency TECHNIQUE: An axial (e.g., hips, spine) and/or appendicular (e.g., radius) exam was performed, as appropriate, using GE Secretary/administrator at [facility]. Images are obtained for bone mineral density measurement and are not obtained for diagnostic purposes. [id number] Exclusions: L3-L4 COMPARISON:  03/05/2015 FINDINGS: Scan quality: Good. LUMBAR SPINE (L1-L2): BMD (in g/cm2): 1.272 T-score: 0.8 Z-score: 2.1 Rate of change from previous exam: 5.6 % LEFT FEMORAL NECK: BMD (in g/cm2): 0.967 T-score: -0.5 Z-score: 1.0 LEFT TOTAL HIP: BMD (in g/cm2): 1.059 T-score: 0.4 Z-score: 1.8 RIGHT FEMORAL NECK: BMD (in g/cm2): 0.890 T-score: -1.1 Z-score: 0.4 Rate of change from previous exam: No significant rate of change from previous exam. RIGHT TOTAL HIP: BMD (in g/cm2): 0.905 T-score: -0.8 Z-score: 0.5 DUAL-FEMUR TOTAL MEAN: Rate of change from previous exam: -5.8 % LEFT FOREARM (RADIUS 33%): BMD (in g/cm2): 0.910 T-score: 0.4 Z-score: 2.9 Rate of change from previous exam: -4.5 % FRAX 10-YEAR PROBABILITY OF FRACTURE: 10-year fracture risk is performed using the  [facility] FRAX calculator based on patient-reported risk factors. Major osteoporotic fracture: 6.3% Hip fracture: 1.5% Other situations known to alter the reliability of the FRAX score should be considered when making treatment decisions, including chronic glucocorticoid use and past treatments. Further guidance on treatment can be found at the [facility] [employer] [facility]'s website [email or web address]/. IMPRESSION: Osteopenia based on BMD. Fracture risk is increased. Increased risk is based on low BMD. RECOMMENDATIONS: 1. All patients should optimize calcium  and vitamin D  intake. 2. Consider FDA-approved medical therapies in postmenopausal women and men aged 47 years and older, based on the following: - A hip or vertebral (clinical or morphometric) fracture - T-score less than or equal to -2.5 and secondary causes have been excluded. - Low bone mass (T-score between -1.0 and -2.5) and a 10-year probability of a hip fracture greater than or equal to 3% or a 10-year probability of a major osteoporosis-related fracture greater than or equal to 20% based on the [place] -adapted WHO algorithm. - Clinician judgment and/or patient preferences may indicate treatment for people with 10-year fracture probabilities above or below these levels 3. Patients with diagnosis of osteoporosis or at high risk for fracture should have regular bone mineral density tests. For patients eligible for Medicare, routine testing is allowed once every 2 years. The testing frequency can be increased to one year for patients who have rapidly progressing disease, those who are receiving or discontinuing medical therapy to restore bone mass, or have additional risk factors. Electronically Signed   By: Reyes Phi M.D.   On: 06/29/2023 15:39    Assessment & Plan:  .Other hyperlipidemia -     Lipid panel -     LDL cholesterol, direct  Hypertension, unspecified type -     amLODIPine  Besylate; Take 1 tablet (5 mg total) by mouth daily.  Take additional 5 mg in pm if BP>130/>80  Dispense: 180 tablet; Refill: 1 -     Comprehensive metabolic panel with GFR  Prediabetes -     Comprehensive metabolic panel with GFR -     Hemoglobin A1c  Other fatigue -     CBC with Differential/Platelet  Urinary frequency Assessment & Plan: Improved with current regimen of suspended furosemide  and daily oxybutynin .  No changes today    Overactive bladder  Assessment & Plan: Continue oxybutynin     Morbid (severe) obesity due to excess calories (HCC) Assessment & Plan: I have congratulated her in reduction of   BMI and encouraged  Continued weight loss with goal of 10% of body weigh over the next 6 months using a low glycemic index diet and regular exercise a minimum of 5 days per week.     Anxiety Assessment & Plan: Managed with prn alprazolam  at 1/2 tablet.   Other orders -     DULoxetine  HCl; Take 1 capsule (60 mg total) by mouth daily.  Dispense: 90 capsule; Refill: 1     Follow-up: No follow-ups on file.   Monica LITTIE Kettering, MD

## 2023-11-28 NOTE — Assessment & Plan Note (Signed)
 Managed with prn alprazolam  at 1/2 tablet.

## 2023-11-28 NOTE — Assessment & Plan Note (Signed)
 I have congratulated her in reduction of   BMI and encouraged  Continued weight loss with goal of 10% of body weigh over the next 6 months using a low glycemic index diet and regular exercise a minimum of 5 days per week.

## 2023-11-29 ENCOUNTER — Ambulatory Visit: Payer: Self-pay | Admitting: Internal Medicine

## 2024-03-06 ENCOUNTER — Telehealth: Payer: Self-pay

## 2024-03-06 NOTE — Telephone Encounter (Signed)
 I received an incoming referral for the patient; and called the patient and scheduled an appointment. I sent out an appointment reminder with a No-Show letter and a map. I contacted the patient to verify and update the information in her chart, including the insurance guarantor and other relevant details.

## 2024-03-07 NOTE — Progress Notes (Signed)
 "  Cardiology Office Note    Date:  03/08/2024   ID:  Monica [name], DOB Apr 18, 1935, MRN [id number]  PCP:  Monica Verneita CROME, MD  Cardiologist:  Deatrice Cage, MD  Electrophysiologist:  None   Chief Complaint: Follow up  History of Present Illness:   Monica [name] is a 89 y.o. female with history of coronary artery calcification, atrial flutter, HFpEF, embolic CVA, bilateral DVT, HTN, and OSA who presents for follow-up of coronary artery calcification, atrial flutter, and HFpEF.   She was previously followed by Dr. Claudene with transition of care to Dr. Cage in 02/2022.  She was admitted in 2020 with a left MCA stroke and treated with catheter-based intervention.  She was found to have bilateral DVT and was anticoagulated with apixaban .  Echo at that time showed an EF greater than 65%, diastolic dysfunction, normal RV systolic function with normal ventricular cavity size, mildly to moderately dilated left atrium, moderate mitral annular calcification, and no intracardiac thrombi noted.  She developed atrial flutter that converted to sinus rhythm with amiodarone  with subsequent discontinuation of antiarrhythmic therapy.  She was noted to be in atrial flutter in 2022 with intermittent tachycardia.  Low-dose digoxin  was added at that time.  She was most recently seen in the office in 02/2023 and continued to do well from a cardiac perspective.  Her daughter felt like the patient's fatigue and chronic SOB were more pronounced following initiation of digoxin .  Her weight was down 11 pounds when compared to her prior visit.  Digoxin  was discontinued given she was maintaining sinus rhythm.  Echo in 03/2023 showed an EF of 60 to 65%, no regional wall motion abnormalities, mild LVH, grade 2 diastolic dysfunction, normal RV systolic function and ventricular cavity size, moderately dilated left atrium, no significant valvular abnormality, and a normal CVP.  She comes in accompanied by her daughter today and  is doing reasonably well from a cardiac perspective, without symptoms of frank chest pain.  Chronic dyspnea is largely stable.  Patient and daughter feel like the patient is more fatigued.  She is largely sedentary at baseline, spending most of her time sitting at the computer browsing the Internet.  Her daughter has set up an alarm to remind the patient to stand every 2 hours.  Not currently taking furosemide  due to overactive bladder.  She does note some lower extremity swelling that is more progressive throughout the day and if she is sitting with her feet hanging down.  Her weight is down approximately 7 pounds today when compared to her visit in 02/2023.  No symptoms concerning for bleeding.  Over the past 12 months she has had 5 falls, though daughter is concerned 1 of these episodes may have been a syncopal episode as the patient was unaware she was falling until she hit the ground and a lamp fell over on her.  She feels like her gait is more unsteady.  She also continues to note issues with dysphagia and has follow-up with GI next month.  She notes arthritic pains as well as well as randomly occurring pains that she is concerned are related to neuropathy.   Labs independently reviewed: 11/2023 - Hgb 13.1, PLT 259, A1c 6.4, potassium 4.3, BUN 19, serum creatinine 1.01, albumin 4.1, AST/ALT normal, direct LDL 72, TC 121, TG 86, HDL 40 03/2022 - TSH normal  Past Medical History:  Diagnosis Date   Abnormal gait 09/22/2021   Abnormal weight gain 02/24/2021   Allergic rhinitis  Anxiety    Benign neoplasm of breast 2013   Bilateral shoulder pain 01/13/2020   Cardiac arrhythmia    Cardiomegaly    Cataract    Cervicalgia 01/13/2020   CHF (congestive heart failure) (HCC)    Chronic back pain 12/26/2018   Severe stenosis L spine       Chronic cough 04/15/2016   Chronic knee pain 07/22/2020   Chronic left shoulder pain 06/07/2019   Colon cancer screening 02/24/2021   Compulsive eating patterns     Compulsive overeating    Compulsive overeating 01/01/2015   Degenerative lumbar spinal stenosis 09/20/2017   Depression    Depression with anxiety 12/21/2006   Diastolic heart failure (HCC)    LVH with free wall thickness 1.4 cm EF 60% Dr. Claudene    Diffuse cystic mastopathy 2013   Diverticulosis of large intestine without perforation or abscess without bleeding    Dysuria 06/03/2020   Edema    Enlarged clavicle 12/29/2021   Epistaxis    noted 12/20/18    Epistaxis 01/07/2019   Essential hypertension 12/21/2006   Generalized abdominal pain 01/03/2019   GI bleeding    in ~2018    Gout    Grief 01/13/2020   Hiatal hernia    HTN (hypertension)    Hyperlipidemia    Intertrigo 10/01/2020   Iron  deficiency anemia 01/03/2019   Kidney cysts 01/13/2020   Leg DVT (deep venous thromboembolism), acute, bilateral (HCC) 12/26/2018   Noted US  legs 09/02/18 b/l       Lower GI bleed 10/03/2016   Lump or mass in breast 2012   Memory loss 03/06/2019   Middle cerebral artery embolism, 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   OA (osteoarthritis) of knee    with injections   Obesity    OSA (obstructive sleep apnea)    not on cpap   Other abnormal glucose    Partial nontraumatic rupture of right rotator cuff 09/22/2015   Pelvic floor dysfunction 06/06/2019   Peptic stricture of esophagus    Personal history of tobacco use, presenting hazards to health    Rectal bleeding 02/24/2021   Recurrent falls 01/13/2020   Sciatica    Seborrheic keratoses 04/15/2016   Sinus infection 2010   Spinal stenosis    lumbar    Status post bilateral knee replacements 05/04/2016   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   09/01/2018   Established GNA with thrombectomy intracranial in hospital    Stroke [facility])    Unspecified sleep apnea    Urge incontinence of urine 09/09/2011   Urine frequency 02/17/2021   UTI (urinary tract infection)    e coli 06/2020    Past Surgical History:  Procedure Laterality Date   adenosine cardiolite   1/08   low risk    APPENDECTOMY     BREAST BIOPSY     02/28/11 negative   BREAST LUMPECTOMY     right x1 ('89) left x2 ('70s, '90)   CARDIAC CATHETERIZATION  2001   normal per pt   COLONOSCOPY  11/02   diverticulosis   COLONOSCOPY  9/06   diverticulosis, hemorhoids   COLONOSCOPY N/A 10/01/2016   Procedure: COLONOSCOPY;  Surgeon: Avram Lupita BRAVO, MD;  Location: [facility] ENDOSCOPY;  Service: Endoscopy;  Laterality: N/A;   dexa  3/02   normal   EYE SURGERY  2012   cataract   IR CT HEAD LTD  09/01/2018   IR PERCUTANEOUS ART THROMBECTOMY/INFUSION INTRACRANIAL INC DIAG ANGIO  09/01/2018   JOINT REPLACEMENT  knee b/l 2011   knee replacement Right 9/11   R. Dr. Lucious   RADIOLOGY WITH ANESTHESIA N/A 09/01/2018   Procedure: IR WITH ANESTHESIA;  Surgeon: Dolphus Carrion, MD;  Location: [facility] OR;  Service: Radiology;  Laterality: N/A;   sleep study  5/05   TONSILLECTOMY     TOTAL ABDOMINAL HYSTERECTOMY     fibroids, age of 79   TOTAL KNEE ARTHROPLASTY Left 2012   US  TRANSVAGINAL PELVIC MODIFIED  2000, 2001    Current Medications: Active Medications[1]  Allergies:   Lisinopril   Social History   Socioeconomic History   Marital status: Widowed    Spouse name: Not on file   Number of children: 1   Years of education: Not on file   Highest education level: Not on file  Occupational History   Occupation: Retired school principal    Employer: RETIRED  Tobacco Use   Smoking status: Former    Current packs/day: 0.50    Average packs/day: 0.5 packs/day for 20.0 years (10.0 ttl pk-yrs)    Types: Cigarettes   Smokeless tobacco: Never   Tobacco comments:    quit approx. 20 years ago  Vaping Use   Vaping status: Never Used  Substance and Sexual Activity   Alcohol use: Yes    Alcohol/week: 0.0 standard drinks of alcohol    Comment: wine occasional   Drug use: No   Sexual activity: Never  Other Topics Concern   Not on file  Social History Narrative   Widowed x 30+ years as of  12/2018    1 daughter. Retired principal. Has had to take care of sick family members    Masters degree retired lawyer    Social Drivers of Health   Tobacco Use: Medium Risk (03/08/2024)   Patient History    Smoking Tobacco Use: Former    Smokeless Tobacco Use: Never    Passive Exposure: Not on file  Financial Resource Strain: Low Risk (05/23/2023)   Overall Financial Resource Strain (CARDIA)    Difficulty of Paying Living Expenses: Not hard at all  Food Insecurity: No Food Insecurity (05/23/2023)   Hunger Vital Sign    Worried About Running Out of Food in the Last Year: Never true    Ran Out of Food in the Last Year: Never true  Transportation Needs: No Transportation Needs (05/23/2023)   PRAPARE - Administrator, Civil Service (Medical): No    Lack of Transportation (Non-Medical): No  Physical Activity: Inactive (05/23/2023)   Exercise Vital Sign    Days of Exercise per Week: 0 days    Minutes of Exercise per Session: 0 min  Stress: Stress Concern Present (05/23/2023)   [employer] of Occupational Health - Occupational Stress Questionnaire    Feeling of Stress : Very much  Social Connections: Socially Isolated (05/23/2023)   Social Connection and Isolation Panel    Frequency of Communication with Friends and Family: Twice a week    Frequency of Social Gatherings with Friends and Family: Twice a week    Attends Religious Services: Never    Database Administrator or Organizations: No    Attends Banker Meetings: Never    Marital Status: Widowed  Depression (PHQ2-9): Low Risk (11/28/2023)   Depression (PHQ2-9)    PHQ-2 Score: 0  Alcohol Screen: Low Risk (05/23/2023)   Alcohol Screen    Last Alcohol Screening Score (AUDIT): 2  Housing: Unknown (05/23/2023)   Housing Stability Vital Sign  Unable to Pay for Housing in the Last Year: No    Number of Times Moved in the Last Year: Not on file    Homeless in the Last Year: No  Utilities: Not At Risk  (05/23/2023)   AHC Utilities    Threatened with loss of utilities: No  Health Literacy: Adequate Health Literacy (05/23/2023)   B1300 Health Literacy    Frequency of need for help with medical instructions: Never     Family History:  The patient's family history includes Breast cancer in her sister; Cancer in her brother and sister; Drug abuse in her brother; Gout in an other family member; Heart failure in her mother; Hepatitis in her brother; Hypertension in her mother; Other in her brother; Prostate cancer in her brother; Stroke in her father.  ROS:   12-point review of systems is negative unless otherwise noted in the HPI.   EKGs/Labs/Other Studies Reviewed:    Studies reviewed were summarized above. The additional studies were reviewed today:  2D echo 04/13/2023: 1. Left ventricular ejection fraction, by estimation, is 60 to 65%. The  left ventricle has normal function. The left ventricle has no regional  wall motion abnormalities. There is mild left ventricular hypertrophy.  Left ventricular diastolic parameters  are consistent with Grade II diastolic dysfunction (pseudonormalization).   2. Right ventricular systolic function is normal. The right ventricular  size is normal. Tricuspid regurgitation signal is inadequate for assessing  PA pressure.   3. Left atrial size was moderately dilated.   4. The mitral valve is normal in structure. No evidence of mitral valve  regurgitation. No evidence of mitral stenosis.   5. The aortic valve is normal in structure. Aortic valve regurgitation is  not visualized. No aortic stenosis is present.   6. The inferior vena cava is normal in size with greater than 50%  respiratory variability, suggesting right atrial pressure of 3 mmHg.  __________  Zio patch 05/2020: Atrial Fibrillation occurred continuously (100% burden), ranging from 59-135 bpm (avg of 78 bpm). Isolated VEs were rare (<1.0%), VE Couplets were rare (<1.0%), and no VE Triplets  were present.  __________   Outpatient cardiac monitor 12/2018: Normal sinus rhythm and sinus bradycardia. No atrial fibrillation or flutter. __________   2D echo 09/02/2018: 1. The left ventricle has hyperdynamic systolic function, with an  ejection fraction of >65%. The cavity size was normal. There is mildly  increased left ventricular wall thickness. Left ventricular diastolic  Doppler parameters are consistent with  pseudonormalization.   2. The right ventricle has normal systolic function. The cavity was  normal. There is no increase in right ventricular wall thickness. Right  ventricular systolic pressure could not be assessed.   3. Left atrial size was mild-moderately dilated.   4. The mitral valve is abnormal. There is moderate mitral annular  calcification present.   5. No stenosis of the aortic valve.   6. Likely calcified, prominent Coumadin ridge (normal variant landmark  between LA appendage and left upper pulmonary vein). There is a 0.8 x 1.4  cm calcified mass like lesion adherent to the lateral LA wall, in the  location of the Coumadin ridge (left  lateral ridge). It is non mobile, and likely represents calcified normal  variant structure.   7. No intracardiac thrombi or masses were visualized.    EKG:  EKG is ordered today.  The EKG ordered today demonstrates NSR, 69 bpm, nonspecific ST-T changes, consistent with prior tracing  Recent Labs: 11/28/2023: ALT 13;  BUN 19; Creatinine, Ser 1.01; Hemoglobin 13.1; Platelets 259.0; Potassium 4.3; Sodium 141  Recent Lipid Panel    Component Value Date/Time   CHOL 121 11/28/2023 1115   CHOL 119 03/24/2023 1519   TRIG 86.0 11/28/2023 1115   HDL 40.40 11/28/2023 1115   HDL 40 03/24/2023 1519   CHOLHDL 3 11/28/2023 1115   VLDL 17.2 11/28/2023 1115   LDLCALC 64 11/28/2023 1115   LDLCALC 60 03/24/2023 1519   LDLDIRECT 72.0 11/28/2023 1115    PHYSICAL EXAM:    VS:  BP (!) 151/68 (BP Location: Left Wrist, Patient  Position: Sitting, Cuff Size: Normal)   Pulse 69 Comment: 76 OXIMETER  Ht 5' 6 (1.676 m)   Wt 222 lb 9.6 oz (101 kg)   SpO2 98%   BMI 35.93 kg/m   BMI: Body mass index is 35.93 kg/m.  Physical Exam Constitutional:      Appearance: She is well-developed.  HENT:     Head: Normocephalic and atraumatic.  Eyes:     General:        Right eye: No discharge.        Left eye: No discharge.  Cardiovascular:     Rate and Rhythm: Normal rate and regular rhythm.     Heart sounds: Normal heart sounds, S1 normal and S2 normal. Heart sounds not distant. No midsystolic click and no opening snap. No murmur heard.    No friction rub.  Pulmonary:     Effort: Pulmonary effort is normal. No respiratory distress.     Breath sounds: Normal breath sounds. No decreased breath sounds, wheezing, rhonchi or rales.  Musculoskeletal:     Cervical back: Normal range of motion.     Comments: Trivial bilateral ankle edema.  Skin:    General: Skin is warm and dry.     Nails: There is no clubbing.  Neurological:     Mental Status: She is alert and oriented to person, place, and time.  Psychiatric:        Speech: Speech normal.        Behavior: Behavior normal.        Thought Content: Thought content normal.        Judgment: Judgment normal.     Wt Readings from Last 3 Encounters:  03/08/24 222 lb 9.6 oz (101 kg)  11/28/23 225 lb 6.4 oz (102.2 kg)  08/02/23 235 lb 2 oz (106.7 kg)     ASSESSMENT & PLAN:   Paroxysmal atrial flutter: Maintaining sinus rhythm in the office today on Toprol -XL 100 mg.  CHA2DS2-VASc at least 8 (CHF, HTN, age x 2, stroke x 2, vascular disease, sex category).  Remains on apixaban  5 mg twice daily and does not meet reduced dosing criteria.  If she continues to have falls, may need to consider EP evaluation for consideration of Watchman implantation.  Recent labs stable.  Coronary artery calcification/HLD: No symptoms of frank chest pain.  On apixaban  in lieu of aspirin given  underlying atrial flutter to minimize bleeding risk.  LDL 72 in 11/2023.  She remains on atorvastatin  20 mg.  HFpEF/dyspnea: Volume status is difficult to assess on physical exam, though she appears euvolemic and well compensated.  Weight is down 7 pounds today when compared to her visit last year.  Obtain echo.  No longer taking furosemide  due to overactive bladder.  In this setting, we will also defer addition of SGLT2 inhibitor given concern for off target effect.  Based on updated echo findings consider addition  of MRA.  HTN: Blood pressure is mildly elevated in the office today, though has historically been well-controlled.  Remains on amlodipine  5 mg daily with an additional 5 mg in the evening if BP is greater than 130/80 as well as Toprol -XL 100 mg.  No changes were made in pharmacotherapy at this time.  Continue to monitor.  History of bilateral DVTs: She remains on long-term apixaban  given atrial flutter.  Recent labs stable.  Fatigue: Longstanding issue.  Cannot exclude metoprolol  contributing.  Update echo as outlined above to evaluate for new cardiomyopathy.  Recent CBC showed normal hemoglobin.  TSH has been normal dating back to at least 2016.  Cannot exclude some degree of deconditioning with largely sedentary lifestyle.  Gait imbalance/falls with possible syncope: Patient and daughter note decrease in patient's balance with gait instability and report 5 falls over the past 12 months with 1 fall concerning for possible syncopal episode.  Given concern for possible syncopal episode we will obtain echo to evaluate for new cardiomyopathy, carotid artery ultrasound, and place ZIO AT to evaluate for symptomatic bradycardia contributing to fatigue, breakthrough atrial flutter, high-grade AV block, or prolonged pauses.  Query cognitive decline.  She would also benefit from consideration of PT, which I will reach out to her PCP to assist in arranging if they agree.    Disposition: F/u with Dr.  Darron in 3 months.   Medication Adjustments/Labs and Tests Ordered: Current medicines are reviewed at length with the patient today.  Concerns regarding medicines are outlined above. Medication changes, Labs and Tests ordered today are summarized above and listed in the Patient Instructions accessible in Encounters.   Signed, Bernardino Bring, PA-C 03/08/2024 2:41 PM     [facility] [place] [address] [place], [place] [address] [phone]     [1]  Current Meds  Medication Sig   allopurinol  (ZYLOPRIM ) 100 MG tablet TAKE 2 TABLETS BY MOUTH EVERY DAY   ALPRAZolam  (XANAX ) 0.25 MG tablet Take 1 tablet (0.25 mg total) by mouth daily as needed for anxiety.   amLODipine  (NORVASC ) 5 MG tablet Take 1 tablet (5 mg total) by mouth daily. Take additional 5 mg in pm if BP>130/>80   apixaban  (ELIQUIS ) 5 MG TABS tablet Take 1 tablet (5 mg total) by mouth 2 (two) times daily.   atorvastatin  (LIPITOR) 20 MG tablet TAKE 1 TABLET BY MOUTH EVERYDAY AT BEDTIME   DULoxetine  (CYMBALTA ) 60 MG capsule Take 1 capsule (60 mg total) by mouth daily.   hydrocortisone  2.5 % cream Apply topically 2 (two) times daily.   KLOR-CON  M20 20 MEQ tablet TAKE 1 TABLET (20 MEQ TOTAL) BY MOUTH DAILY. OVERDUE FOLLOW UP VISIT. PLEASE CALL OFFICE TO SCHEDULE APPOINTMENT PRIOR TO NEXT REFILL (FIRST ATTEMPT)   magnesium oxide (MAG-OX) 400 (240 Mg) MG tablet Take 400 mg by mouth daily.   metoprolol  succinate (TOPROL -XL) 100 MG 24 hr tablet TAKE WITH OR IMMEDIATELY FOLLOWING A MEAL.   nystatin  cream (MYCOSTATIN ) Apply 1 Application topically 2 (two) times daily. Prn right back   oxybutynin  (DITROPAN  XL) 15 MG 24 hr tablet TAKE 1 TABLET BY MOUTH EVERYDAY AT BEDTIME   triamcinolone  cream (KENALOG ) 0.1 % APPLY 1 APPLICATION TOPICALLY 2 (TWO) TIMES DAILY. TO PATCHY RASH   "

## 2024-03-08 ENCOUNTER — Encounter: Payer: Self-pay | Admitting: Physician Assistant

## 2024-03-08 ENCOUNTER — Ambulatory Visit

## 2024-03-08 ENCOUNTER — Ambulatory Visit: Attending: Physician Assistant | Admitting: Physician Assistant

## 2024-03-08 VITALS — BP 151/68 | HR 69 | Ht 66.0 in | Wt 222.6 lb

## 2024-03-08 DIAGNOSIS — I5032 Chronic diastolic (congestive) heart failure: Secondary | ICD-10-CM

## 2024-03-08 DIAGNOSIS — I10 Essential (primary) hypertension: Secondary | ICD-10-CM

## 2024-03-08 DIAGNOSIS — R296 Repeated falls: Secondary | ICD-10-CM

## 2024-03-08 DIAGNOSIS — R0602 Shortness of breath: Secondary | ICD-10-CM

## 2024-03-08 DIAGNOSIS — I4892 Unspecified atrial flutter: Secondary | ICD-10-CM

## 2024-03-08 DIAGNOSIS — R2689 Other abnormalities of gait and mobility: Secondary | ICD-10-CM

## 2024-03-08 DIAGNOSIS — I2510 Atherosclerotic heart disease of native coronary artery without angina pectoris: Secondary | ICD-10-CM

## 2024-03-08 DIAGNOSIS — R5383 Other fatigue: Secondary | ICD-10-CM | POA: Diagnosis not present

## 2024-03-08 DIAGNOSIS — Z86718 Personal history of other venous thrombosis and embolism: Secondary | ICD-10-CM | POA: Diagnosis not present

## 2024-03-08 DIAGNOSIS — E785 Hyperlipidemia, unspecified: Secondary | ICD-10-CM | POA: Diagnosis not present

## 2024-03-08 NOTE — Patient Instructions (Signed)
 Medication Instructions:  Your physician recommends that you continue on your current medications as directed. Please refer to the Current Medication list given to you today.  *If you need a refill on your cardiac medications before your next appointment, please call your pharmacy*  Lab Work: No labs ordered today   If you have labs (blood work) drawn today and your tests are completely normal, you will receive your results only by: MyChart Message (if you have MyChart) OR A paper copy in the mail If you have any lab test that is abnormal or we need to change your treatment, we will call you to review the results.  Testing/Procedures:   Your physician has requested that you have an echocardiogram. Echocardiography is a painless test that uses sound waves to create images of your heart. It provides your doctor with information about the size and shape of your heart and how well your hearts chambers and valves are working.   You may receive an ultrasound enhancing agent through an IV if needed to better visualize your heart during the echo. This procedure takes approximately one hour.  There are no restrictions for this procedure.  This will take place at [address] ([facility] [address], [place] [address]  Your physician has requested that you have a carotid duplex. This test is an ultrasound of the carotid arteries in your neck. It looks at blood flow through these arteries that supply the brain with blood.   Allow one hour for this exam.  There are no restrictions or special instructions.  This will take place at [address] ([facility], [place] [address]   ZIO AT Long term monitor-Live Telemetry  Your physician has requested you wear a ZIO patch monitor for 14 days.  This is a single patch monitor. [employer] supplies one patch monitor per enrollment. Additional  stickers are not available.  Please do not apply patch if you will be having a  Nuclear Stress Test, Echocardiogram, Cardiac CT, MRI,  or Chest Xray during the period you would be wearing the monitor. The patch cannot be worn during  these tests. You cannot remove and re-apply the ZIO AT patch monitor.  Your ZIO patch monitor will be mailed 3 day USPS to your address on file. It may take 3-5 days to  receive your monitor after you have been enrolled.  Once you have received your monitor, please review the enclosed instructions. Your monitor has  already been registered assigning a specific monitor serial # to you.   Billing and Patient Assistance Program information  [place] has been supplied with any insurance information on record for billing. [employer] offers a sliding scale Patient Assistance Program for patients without insurance, or whose  insurance does not completely cover the cost of the ZIO patch monitor. You must apply for the  Patient Assistance Program to qualify for the discounted rate. To apply, call [employer] at [phone],  select option 4, select option 2 , ask to apply for the Patient Assistance Program, (you can request an  interpreter if needed). [employer] will ask your household income and how many people are in your  household. [employer] will quote your out-of-pocket cost based on this information. They will also be able  to set up a 12 month interest free payment plan if needed.  Applying the monitor    Hold the abrader disc by orange tab. Rub the abrader in 40 strokes over left upper chest as indicated in your  monitor instructions.  Clean area with 4 enclosed alcohol pads. Use all pads to ensure the area is cleaned thoroughly. Let dry   Apply patch as indicated in monitor instructions. Patch will be placed under collarbone on left side of chest  with arrow pointing upward.  Rub patch adhesive wings for 2 minutes. Remove the white label marked 1. Remove the white label marked 2. Rub patch adhesive wings for 2 additional minutes.  While looking in  a mirror, press and release button in center of patch. A small green light will flash 3-4 times. This will be your only indicator that the monitor has been turned on.  Do not shower for the first 24 hours. You may shower after the first 24 hours. You must keep your back toward the water.  Monitor cannot be submerged in water. Press the button if you feel a symptom. You will hear a small click. Record Date, Time and Symptom in Patient Log Book.    Starting the Gateway  In your kit there is a audiological scientist box the size of a cellphone. This is [employer]. It transmits all your recorded data to [facility]. This box must always stay within 10 feet of you. Open the box and push the *  button. There will be a light that blinks orange and then green a few times. When the light stops blinking, the Gateway is connected to the ZIO patch.  Call [employer] at [phone] to confirm your monitor is transmitting.  Returning your monitor  Remove your patch and place it inside the Gateway. In the lower half of the Gateway there is a white  bag with prepaid postage on it. Place Gateway in bag and seal. Mail package back to [place] as soon as  possible. Your physician should have your final report approximately 7 days after you have mailed back  your monitor. Call [facility] Customer Care at [phone] if you have questions regarding your ZIO AT  patch monitor. Call them immediately if you see an orange light blinking on your monitor.  If your monitor falls off in less than 4 days, contact our Monitor department at [phone]. If your  monitor becomes loose or falls off after 4 days call [employer] at [phone] for suggestions on  securing your monitor   Follow-Up: At [facility], you and your health needs are our priority.  As part of our continuing mission to provide you with exceptional heart care, our providers are all part of one team.  This team includes your primary Cardiologist  (physician) and Advanced Practice Providers or APPs (Physician Assistants and Nurse Practitioners) who all work together to provide you with the care you need, when you need it.  Your next appointment:  3 month(s)  Provider:  Deatrice Cage, MD    We recommend signing up for the patient portal called MyChart.  Sign up information is provided on this After Visit Summary.  MyChart is used to connect with patients for Virtual Visits (Telemedicine).  Patients are able to view lab/test results, encounter notes, upcoming appointments, etc.  Non-urgent messages can be sent to your provider as well.   To learn more about what you can do with MyChart, go to [email or web address].

## 2024-03-26 ENCOUNTER — Ambulatory Visit

## 2024-04-01 ENCOUNTER — Ambulatory Visit

## 2024-05-24 ENCOUNTER — Encounter: Admitting: Internal Medicine

## 2024-05-29 ENCOUNTER — Ambulatory Visit

## 2024-05-31 ENCOUNTER — Encounter: Admitting: Internal Medicine

## 2024-06-11 ENCOUNTER — Ambulatory Visit: Admitting: Cardiovascular Disease
